# Patient Record
Sex: Male | Born: 1960
Health system: Southern US, Community
[De-identification: ages and names within clinical notes are randomized; demographics above are authoritative.]

## PROBLEM LIST (undated history)

## (undated) DIAGNOSIS — Z87898 Personal history of other specified conditions: Secondary | ICD-10-CM

## (undated) DIAGNOSIS — M1990 Unspecified osteoarthritis, unspecified site: Secondary | ICD-10-CM

## (undated) DIAGNOSIS — E669 Obesity, unspecified: Secondary | ICD-10-CM

## (undated) DIAGNOSIS — Z87442 Personal history of urinary calculi: Secondary | ICD-10-CM

## (undated) DIAGNOSIS — I82409 Acute embolism and thrombosis of unspecified deep veins of unspecified lower extremity: Secondary | ICD-10-CM

## (undated) DIAGNOSIS — M069 Rheumatoid arthritis, unspecified: Secondary | ICD-10-CM

## (undated) DIAGNOSIS — E119 Type 2 diabetes mellitus without complications: Secondary | ICD-10-CM

## (undated) DIAGNOSIS — C61 Malignant neoplasm of prostate: Secondary | ICD-10-CM

## (undated) DIAGNOSIS — Z9889 Other specified postprocedural states: Secondary | ICD-10-CM

## (undated) DIAGNOSIS — R001 Bradycardia, unspecified: Secondary | ICD-10-CM

## (undated) DIAGNOSIS — R972 Elevated prostate specific antigen [PSA]: Secondary | ICD-10-CM

## (undated) DIAGNOSIS — H269 Unspecified cataract: Secondary | ICD-10-CM

## (undated) DIAGNOSIS — N200 Calculus of kidney: Secondary | ICD-10-CM

## (undated) DIAGNOSIS — I10 Essential (primary) hypertension: Secondary | ICD-10-CM

## (undated) DIAGNOSIS — Z86711 Personal history of pulmonary embolism: Secondary | ICD-10-CM

## (undated) DIAGNOSIS — G4730 Sleep apnea, unspecified: Secondary | ICD-10-CM

## (undated) DIAGNOSIS — H8110 Benign paroxysmal vertigo, unspecified ear: Secondary | ICD-10-CM

## (undated) DIAGNOSIS — E0500 Thyrotoxicosis with diffuse goiter without thyrotoxic crisis or storm: Secondary | ICD-10-CM

## (undated) DIAGNOSIS — T8450XA Infection and inflammatory reaction due to unspecified internal joint prosthesis, initial encounter: Secondary | ICD-10-CM

## (undated) DIAGNOSIS — E079 Disorder of thyroid, unspecified: Secondary | ICD-10-CM

## (undated) DIAGNOSIS — I499 Cardiac arrhythmia, unspecified: Secondary | ICD-10-CM

## (undated) DIAGNOSIS — I209 Angina pectoris, unspecified: Secondary | ICD-10-CM

## (undated) DIAGNOSIS — G20A1 Parkinson's disease without dyskinesia, without mention of fluctuations: Secondary | ICD-10-CM

## (undated) HISTORY — DX: Elevated prostate specific antigen (PSA): R97.20

## (undated) HISTORY — DX: Personal history of pulmonary embolism: Z86.711

## (undated) HISTORY — DX: Malignant neoplasm of prostate: C61

## (undated) HISTORY — PX: OTHER SURGICAL HISTORY: SHX169

## (undated) HISTORY — DX: Acute embolism and thrombosis of unspecified deep veins of unspecified lower extremity: I82.409

## (undated) HISTORY — PX: NEPHROLITHOTOMY: SUR881

## (undated) HISTORY — DX: Obesity, unspecified: E66.9

## (undated) HISTORY — PX: BACK SURGERY: SHX140

## (undated) HISTORY — DX: Personal history of other specified conditions: Z87.898

## (undated) HISTORY — PX: KNEE SURGERY: SHX244

## (undated) HISTORY — DX: Calculus of kidney: N20.0

## (undated) HISTORY — DX: Rheumatoid arthritis, unspecified: M06.9

## (undated) HISTORY — DX: Benign paroxysmal vertigo, unspecified ear: H81.10

## (undated) HISTORY — DX: Bradycardia, unspecified: R00.1

## (undated) HISTORY — DX: Infection and inflammatory reaction due to unspecified internal joint prosthesis, initial encounter: T84.50XA

## (undated) HISTORY — DX: Unspecified cataract: H26.9

## (undated) HISTORY — PX: COLONOSCOPY: SHX174

## (undated) HISTORY — PX: LITHOTRIPSY: SUR834

## (undated) HISTORY — PX: MENISECTOMY: SHX5181

---
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
## 1994-08-18 DIAGNOSIS — E0500 Thyrotoxicosis with diffuse goiter without thyrotoxic crisis or storm: Secondary | ICD-10-CM

## 1994-08-18 HISTORY — DX: Thyrotoxicosis with diffuse goiter without thyrotoxic crisis or storm: E05.00

## 1997-08-18 HISTORY — PX: SPINE SURGERY: SHX786

## 1998-11-25 ENCOUNTER — Encounter: Payer: Self-pay | Admitting: Emergency Medicine

## 1998-11-25 ENCOUNTER — Emergency Department (HOSPITAL_COMMUNITY): Admission: EM | Admit: 1998-11-25 | Discharge: 1998-11-25 | Payer: Self-pay

## 2001-09-10 ENCOUNTER — Encounter: Admission: RE | Admit: 2001-09-10 | Discharge: 2001-09-10 | Payer: Self-pay | Admitting: Family Medicine

## 2001-09-10 ENCOUNTER — Encounter: Payer: Self-pay | Admitting: Family Medicine

## 2002-09-22 ENCOUNTER — Encounter (HOSPITAL_COMMUNITY): Admission: RE | Admit: 2002-09-22 | Discharge: 2002-10-22 | Payer: Self-pay | Admitting: Preventative Medicine

## 2002-10-04 ENCOUNTER — Encounter: Payer: Self-pay | Admitting: Preventative Medicine

## 2002-10-04 ENCOUNTER — Ambulatory Visit (HOSPITAL_COMMUNITY): Admission: RE | Admit: 2002-10-04 | Discharge: 2002-10-04 | Payer: Self-pay | Admitting: Preventative Medicine

## 2002-11-01 ENCOUNTER — Encounter (HOSPITAL_COMMUNITY): Admission: RE | Admit: 2002-11-01 | Discharge: 2002-12-01 | Payer: Self-pay | Admitting: Orthopaedic Surgery

## 2004-08-18 HISTORY — PX: KNEE ARTHROSCOPY: SUR90

## 2005-08-21 ENCOUNTER — Emergency Department (HOSPITAL_COMMUNITY): Admission: EM | Admit: 2005-08-21 | Discharge: 2005-08-21 | Payer: Self-pay | Admitting: Emergency Medicine

## 2005-08-28 ENCOUNTER — Emergency Department (HOSPITAL_COMMUNITY): Admission: EM | Admit: 2005-08-28 | Discharge: 2005-08-28 | Payer: Self-pay | Admitting: Emergency Medicine

## 2007-06-21 ENCOUNTER — Emergency Department (HOSPITAL_COMMUNITY): Admission: EM | Admit: 2007-06-21 | Discharge: 2007-06-21 | Payer: Self-pay | Admitting: Emergency Medicine

## 2007-06-24 ENCOUNTER — Ambulatory Visit: Payer: Self-pay | Admitting: Internal Medicine

## 2007-06-30 ENCOUNTER — Ambulatory Visit: Payer: Self-pay | Admitting: Internal Medicine

## 2007-06-30 ENCOUNTER — Ambulatory Visit (HOSPITAL_COMMUNITY): Admission: RE | Admit: 2007-06-30 | Discharge: 2007-06-30 | Payer: Self-pay | Admitting: Internal Medicine

## 2007-06-30 ENCOUNTER — Encounter: Payer: Self-pay | Admitting: Internal Medicine

## 2007-12-17 ENCOUNTER — Emergency Department (HOSPITAL_COMMUNITY): Admission: EM | Admit: 2007-12-17 | Discharge: 2007-12-17 | Payer: Self-pay | Admitting: Emergency Medicine

## 2008-05-08 ENCOUNTER — Emergency Department (HOSPITAL_COMMUNITY): Admission: EM | Admit: 2008-05-08 | Discharge: 2008-05-08 | Payer: Self-pay | Admitting: Emergency Medicine

## 2008-08-18 HISTORY — PX: KIDNEY STONE SURGERY: SHX686

## 2008-09-06 ENCOUNTER — Emergency Department (HOSPITAL_COMMUNITY): Admission: EM | Admit: 2008-09-06 | Discharge: 2008-09-06 | Payer: Self-pay | Admitting: Emergency Medicine

## 2009-10-08 ENCOUNTER — Ambulatory Visit: Payer: Self-pay | Admitting: Cardiology

## 2009-10-08 ENCOUNTER — Inpatient Hospital Stay (HOSPITAL_COMMUNITY): Admission: EM | Admit: 2009-10-08 | Discharge: 2009-10-11 | Payer: Self-pay | Admitting: Emergency Medicine

## 2009-10-09 ENCOUNTER — Encounter (INDEPENDENT_AMBULATORY_CARE_PROVIDER_SITE_OTHER): Payer: Self-pay | Admitting: Internal Medicine

## 2009-10-10 ENCOUNTER — Encounter: Payer: Self-pay | Admitting: Cardiology

## 2009-12-14 ENCOUNTER — Telehealth (INDEPENDENT_AMBULATORY_CARE_PROVIDER_SITE_OTHER): Payer: Self-pay

## 2010-01-30 ENCOUNTER — Emergency Department (HOSPITAL_COMMUNITY): Admission: EM | Admit: 2010-01-30 | Discharge: 2010-01-30 | Payer: Self-pay | Admitting: Emergency Medicine

## 2010-02-17 ENCOUNTER — Emergency Department (HOSPITAL_COMMUNITY): Admission: EM | Admit: 2010-02-17 | Discharge: 2010-02-18 | Payer: Self-pay | Admitting: Emergency Medicine

## 2010-09-17 NOTE — Progress Notes (Signed)
**Note De-Identified [place] Obfuscation** Summary: Event Monitor  Phone Note Other Incoming   Caller: Dorothy Reason for Call: Discuss billing issue Summary of Call: S: Dorothy from [facility] states that pt. has no insurance and that they sent him hardship papers but never received them or a phone call back.  B: Dr. Dietrich Pates saw pt. while in [facility] and ordered 21 day event monitor on 10-19-09. A: Several attempts have been made to reach pt. at [phone] but got no answer and pt. did not return call to office.  R:    Initial call taken by: Larita Fife Asante Ritacco LPN,  December 14, 2009 3:34 PM  Follow-up for Phone Call        Noted. Follow-up by: Kathlen Brunswick, MD, [facility],  Dec 17, 2009 6:45 AM

## 2010-11-03 LAB — STREP A DNA PROBE: Group A Strep Probe: NEGATIVE

## 2010-11-03 LAB — URINALYSIS, ROUTINE W REFLEX MICROSCOPIC
Glucose, UA: NEGATIVE mg/dL
Urobilinogen, UA: 1 mg/dL (ref 0.0–1.0)

## 2010-11-03 LAB — URINE CULTURE

## 2010-11-03 LAB — URINE MICROSCOPIC-ADD ON

## 2010-11-06 LAB — CARDIAC PANEL(CRET KIN+CKTOT+MB+TROPI)
CK, MB: 2.1 ng/mL (ref 0.3–4.0)
CK, MB: 3 ng/mL (ref 0.3–4.0)
Relative Index: 1.1 (ref 0.0–2.5)
Total CK: 186 U/L (ref 7–232)

## 2010-11-06 LAB — CULTURE, BLOOD (ROUTINE X 2)
Culture: NO GROWTH
Culture: NO GROWTH
Report Status: 2272011
Report Status: 2272011

## 2010-11-06 LAB — BASIC METABOLIC PANEL
BUN: 8 mg/dL (ref 6–23)
CO2: 30 mEq/L (ref 19–32)
Calcium: 9.5 mg/dL (ref 8.4–10.5)
Chloride: 104 mEq/L (ref 96–112)
Chloride: 107 mEq/L (ref 96–112)
Creatinine, Ser: 0.83 mg/dL (ref 0.4–1.5)
Creatinine, Ser: 0.88 mg/dL (ref 0.4–1.5)
GFR calc Af Amer: >60 mL/min (ref >60)
GFR calc non Af Amer: >60 mL/min (ref >60)
Glucose, Bld: 100 mg/dL — ABNORMAL HIGH (ref 70–99)
Glucose, Bld: 103 mg/dL — ABNORMAL HIGH (ref 70–99)
Glucose, Bld: 91 mg/dL (ref 70–99)
Potassium: 3.7 mEq/L (ref 3.5–5.1)
Potassium: 3.8 mEq/L (ref 3.5–5.1)
Potassium: 3.9 mEq/L (ref 3.5–5.1)
Sodium: 138 mEq/L (ref 135–145)
Sodium: 138 mEq/L (ref 135–145)

## 2010-11-06 LAB — CBC
HCT: 41.3 % (ref 39.0–52.0)
Hemoglobin: 14.4 g/dL (ref 13.0–17.0)
MCHC: 33.3 g/dL (ref 30.0–36.0)
MCHC: 33.5 g/dL (ref 30.0–36.0)
MCHC: 33.7 g/dL (ref 30.0–36.0)
MCV: 87.5 fL (ref 78.0–100.0)
Platelets: 263 10*3/uL (ref 150–400)
RBC: 4.7 MIL/uL (ref 4.22–5.81)
RBC: 4.93 MIL/uL (ref 4.22–5.81)
WBC: 6.7 10*3/uL (ref 4.0–10.5)
WBC: 7 10*3/uL (ref 4.0–10.5)

## 2010-11-06 LAB — DIFFERENTIAL
Basophils Absolute: 0 10*3/uL (ref 0.0–0.1)
Basophils Relative: 0 % (ref 0–1)
Basophils Relative: 0 % (ref 0–1)
Eosinophils Absolute: 0 10*3/uL (ref 0.0–0.7)
Eosinophils Relative: 1 % (ref 0–5)
Eosinophils Relative: 1 % (ref 0–5)
Lymphocytes Relative: 30 % (ref 12–46)
Lymphocytes Relative: 34 % (ref 12–46)
Lymphs Abs: 1.9 10*3/uL (ref 0.7–4.0)
Lymphs Abs: 2 10*3/uL (ref 0.7–4.0)
Monocytes Absolute: 0.4 10*3/uL (ref 0.1–1.0)
Monocytes Absolute: 0.5 10*3/uL (ref 0.1–1.0)
Monocytes Relative: 6 % (ref 3–12)
Monocytes Relative: 7 % (ref 3–12)
Neutro Abs: 3.7 10*3/uL (ref 1.7–7.7)
Neutro Abs: 4.1 10*3/uL (ref 1.7–7.7)
Neutrophils Relative %: 62 % (ref 43–77)

## 2010-11-06 LAB — URINALYSIS, ROUTINE W REFLEX MICROSCOPIC
Bilirubin Urine: NEGATIVE
Glucose, UA: NEGATIVE mg/dL
Hgb urine dipstick: NEGATIVE
Ketones, ur: NEGATIVE mg/dL
Nitrite: POSITIVE — AB
Protein, ur: NEGATIVE mg/dL
Specific Gravity, Urine: 1.025 (ref 1.005–1.030)
pH: 6 (ref 5.0–8.0)

## 2010-11-06 LAB — PROTIME-INR
INR: 0.97 (ref 0.00–1.49)
Prothrombin Time: 12.8 seconds (ref 11.6–15.2)

## 2010-11-06 LAB — RAPID URINE DRUG SCREEN, HOSP PERFORMED
Benzodiazepines: NOT DETECTED
Cocaine: NOT DETECTED
Tetrahydrocannabinol: NOT DETECTED

## 2010-11-06 LAB — HEPATIC FUNCTION PANEL
Alkaline Phosphatase: 76 U/L (ref 39–117)
Bilirubin, Direct: 0.1 mg/dL (ref 0.0–0.3)
Indirect Bilirubin: 0.8 mg/dL (ref 0.3–0.9)
Total Bilirubin: 0.9 mg/dL (ref 0.3–1.2)
Total Protein: 6.7 g/dL (ref 6.0–8.3)

## 2010-11-06 LAB — URINE CULTURE: Colony Count: >=100000

## 2010-11-06 LAB — BRAIN NATRIURETIC PEPTIDE: Pro B Natriuretic peptide (BNP): <30 pg/mL (ref 0.0–100.0)

## 2010-11-06 LAB — LIPID PANEL
Cholesterol: 161 mg/dL (ref 0–200)
Cholesterol: 173 mg/dL (ref 0–200)
LDL Cholesterol: 105 mg/dL — ABNORMAL HIGH (ref 0–99)
LDL Cholesterol: 96 mg/dL (ref 0–99)
Triglycerides: 146 mg/dL (ref <150)
Triglycerides: 155 mg/dL — ABNORMAL HIGH (ref <150)
VLDL: 29 mg/dL (ref 0–40)
VLDL: 31 mg/dL (ref 0–40)

## 2010-11-06 LAB — APTT
aPTT: 32 seconds (ref 24–37)
aPTT: 37 seconds (ref 24–37)

## 2010-11-06 LAB — POCT CARDIAC MARKERS: Myoglobin, poc: 76.6 ng/mL (ref 12–200)

## 2010-11-06 LAB — TSH: TSH: 1.637 u[IU]/mL (ref 0.350–4.500)

## 2010-11-06 LAB — URINE MICROSCOPIC-ADD ON

## 2010-12-31 NOTE — Consult Note (Signed)
NAME:  Willie Howe, Willie Howe                  ACCOUNT NO.:  [id number]   MEDICAL RECORD NO.:  [id number]          PATIENT TYPE:  AMB   LOCATION:  DAY                           FACILITY:  [facility]   PHYSICIAN:  Willie Howe, M.D. DATE OF BIRTH:  Dec 24, 1960   DATE OF CONSULTATION:  06/24/2007  DATE OF DISCHARGE:                                 CONSULTATION   PHYSICIAN REQUESTING CONSULTATION:  Dr. Greta Howe.   REASON FOR CONSULTATION:  Abnormal CT scan of stomach.   PRIMARY CARE PHYSICIAN:  Dr. Corine Howe in [place].   HISTORY OF PRESENT ILLNESS:  Patient is a 50 year old African-American  male who presented to the emergency department for a several week  history of left flank pain.  He states he went to the emergency  department because he had been laid off and does not have any insurance.  He states he has had intermittent left flank pain which tends to be  worse with movement.  He stated he has a lot of back problems but it  seems to be a little bit different type pain.  He also had had  difficulty urinating and thought this was all related.  He complains of  urinary frequency and nocturia.  Denies any dysuria.  At this point in  time his urinary symptoms seem to have improved.  He denies any nausea  or vomiting, dysphagia, odynophagia, heartburn, melena, rectal bleeding.  He does recall a couple weeks ago stools were a little loose and he  thought he might have ate something bad.  Stools have improved but are  still really soft.  In the emergency department he had two CTs, one  without contrast and one with contrast.  Basically, he had  nonobstructive left renal calculus.  He had omental nodularity,  cholelithiasis and thickening of the greater curvature of the stomach at  the level of the fundus.  Wall thickness was 2.8 centimeters.  Just  medial to the fundus/proximal body of the stomach there were a few lymph  nodes measuring up to 7 millimeters.  He also had a left external iliac  lymph node measuring 1.8 x 0.8 millimeters.  He had fatty infiltration  of the liver.  His white blood cell count was 6, hemoglobin 14.3,  hematocrit 42.4, platelets 293,000.  Sodium 136, potassium 4.1, BUN 7,  creatinine 0.76.  Urinalysis was negative.   CURRENT MEDICATIONS:  1. OxyContin p.Willien. for back pain.  2. Tylenol p.Willien.   ALLERGIES:  No known drug allergies.   PAST MEDICAL HISTORY:  1. He has a remote history of Graves' disease.  2. He states he has been off of medication for years and without any      difficulties.  3. He has had back problems status post three surgeries.  4. He has had three surgeries on his knee.  5. He had colonoscopy about 8 years ago in [place].  Denies any      polyps.  6. He has a remote history of bleeding peptic ulcer disease as a      teenager which he  states was related to alcohol use.   FAMILY HISTORY:  Paternal uncle and paternal grandmother both with  colorectal cancer, his uncle was in his 85s, his grandmother was in her  58s.   SOCIAL HISTORY:  1. He is married.  2. He has 4 children.  3. He is currently unemployed, having been laid off.  He worked for      the [employer] for the [employer].  4. He quit smoking over 14 years ago.  5. Quit alcohol use over 15 years ago.   REVIEW OF SYSTEMS:  See HPI for GI.  CONSTITUTIONAL:  Denies any weight  loss.  CARDIOPULMONARY:  Denies any chest pain or shortness of breath.  See HPI for GU.   PHYSICAL EXAM:  Weight 259, height 6 feet 3 inches, temp 98, blood  pressure 110/82, pulse 60.  GENERAL:  A pleasant, obese African-American male in no acute distress.  Skin warm and dry, no jaundice.  HEENT:  Sclera nonicteric.  Oropharyngeal mucosa moist and pink, no  lesions, erythema or exudate.  No lymphadenopathy or thyromegaly.  CHEST:  Lungs are clear to auscultation.  CARDIAC EXAM:  Reveals regular rate and rhythm, normal S1 and S2, no  murmurs, rubs or  gallops.  ABDOMEN:  Positive bowel sounds, obese but symmetrical.  He has very  mild tenderness along the left lower abdomen to left flank area to deep  palpation, no organomegaly or masses, no rebound, tenderness or  guarding, no abdominal bruits, no hernias.  EXTREMITIES:  No edema.   IMPRESSION:  [name] is a 50 year old gentleman who has a few week  history of left flank pain which tends to be worse with movement.  He  also had associated urinary symptoms which have improved.  A CT is  remarkable for gastric thickening and some adjacent lymph nodes  measuring 7 millimeters.  He also had omentum nodularity.  He needs to  have upper endoscopy to rule out gastric carcinoma.  It is possible  these findings could be due to prior or current peptic ulcer disease.  Omentum nodularity is concerning, however.  If unremarkable, he will  need to have a colonoscopy for further workup.  He denies any first  degree relatives with colorectal cancer but has two second degree  relatives with history of colorectal cancer and one at a fairly early  age.   PLAN:  1. EGD in the very near future.  2. Further recommendations to follow.   I would to thank Dr. Greta Howe for allowing [place] to take part in the  care of this patient.      Willie Howe, P.AJonathon Howe, M.D.  Electronically Signed    LL/MEDQ  D:  06/25/2007  T:  06/25/2007  Job:  [id number]   cc:   Willie Howe, M.D.  Fax: [phone]   Willie Howe, M.D.  Fax: [phone]

## 2010-12-31 NOTE — Op Note (Signed)
NAME:  Willie Howe, Willie Howe                  ACCOUNT NO.:  [id number]   MEDICAL RECORD NO.:  [id number]          PATIENT TYPE:  AMB   LOCATION:  DAY                           FACILITY:  [facility]   PHYSICIAN:  R. Roetta Sessions, M.D. DATE OF BIRTH:  1961/07/30   DATE OF PROCEDURE:  06/30/2007  DATE OF DISCHARGE:                               OPERATIVE REPORT   PROCEDURE:  Esophagogastroduodenoscopy and biopsy.   INDICATIONS FOR PROCEDURE:  This is a 50 year old African-American male  who presented to the ED recently for left flank pain.  Was seen by Dr.  Colon Branch.  CT without contrast, followed by CT of the abdomen with  contrast demonstrated suggestion of thickening of the gastric mucosa and  some abnormal appearing omentum.  Also nonobstructing left ureteral  calculi.  I reviewed these films with Dr. Gordan Payment recently and they  do raise a question of possible omental and gastric wall thickening.  He  comes here for EGD to further evaluate the possible abnormality on the  CT scan.  He has no __________ , odynophagia, dysphagia, early satiety,  reflux symptoms, nausea, or vomiting, hematochezia, or melena.  He has  not lost any weight.  EGD is now being performed. This approach has been  discussed with the patient at length.  Please see documentation in the  medical record.   PROCEDURE:  Oxygen saturation, blood pressure, pulse, respirations were  monitored throughout the entire procedure.  Conscious sedation with  Versed 3 mg IV, Demerol 100 mg IV in divided doses.  Cetacaine spray for  topical pharyngeal anesthesia.   INSTRUMENT:  Pentax video chip system.   FINDINGS:  Examination oftubular esophagus revealed a mid esophagus body  diverticulum and there was a tiny distal esophageal erosion, otherwise  the esophageal mucosa appeared normal.  EG junction was easily  traversed.  Stomach:  The gastric cavity was empty.  It was insufflated with air and  thorough examination of the gastric mucosa  including retroflexion of the  proximal stomach and esophagogastric junction demonstrated a small  hiatal hernia and some diffuse, tiny submucosal petechial hemorrhages.  There were some localized polypoid appearing mucosa in the antrum, and  an area of 1 cm polypoid mucosa on the incisura.  The stomach  insufflated again very well.  There was no ulcer.  I did not see any  evidence of any __________ linitis plastica or infiltrating process.  Pylorus was patent and easy to traversed. Examination of the bulb and  the second portion revealed no abnormalities.   THERAPEUTIC/DIAGNOSTIC MANEUVERS PERFORMED:  Biopsies of the polypoid  antral and incisura mucosa for tagged histologic studies using jumbo  forceps.  The patient tolerated the procedure well and was reactive in  endoscopy.   IMPRESSION:  1. Localized area of polypoid mucosa, antrum and incisura, diffuse      tiny submucosal gastric petechial hemorrhage of uncertain      significance, status post biopsy, small hiatal hernia.  2. Mid esophageal diverticulum, tiny distal esophageal erosion.  3. Normal D1and D2.   I doubt that these findings have  anything to do with his left flank  pain.  Moreover, I doubt that today's findings have anything to do with  thickening of the gastric mucosa and the questionable omental  abnormalities seen on recent CT scan.  I would continue to be concerned  about the possibility of the migration of left ureteral calculus from  time to time producing some of his left flank pain.   RECOMMENDATIONS:  1. We will follow up on pathology.  2. Check H. pylori serologies.  Will make further recommendations in      the very near future.  At a minimum Willie Howe ought to come back and      have a colonoscopy at some point in the near future.  If he has H.      Pylori, we will embark on a treatment regimen.      Willie Howe, M.D.  Electronically Signed     RMR/MEDQ  D:  06/30/2007  T:  07/01/2007   Job:  [id number]   cc:   Nicoletta Dress. Colon Branch, M.D.  Fax: [phone]   Alexia Freestone, M.D.  Fax: [phone]   Gordan Payment, MD  [facility]

## 2011-05-27 LAB — CBC
Platelets: 293
WBC: 6

## 2011-05-27 LAB — DIFFERENTIAL
Eosinophils Absolute: 0.1
Lymphocytes Relative: 28
Lymphs Abs: 1.7
Neutro Abs: 3.8
Neutrophils Relative %: 63

## 2011-05-27 LAB — BASIC METABOLIC PANEL
BUN: 7
Calcium: 9.2
Creatinine, Ser: 0.76
GFR calc Af Amer: >60
GFR calc non Af Amer: >60

## 2011-05-27 LAB — URINALYSIS, ROUTINE W REFLEX MICROSCOPIC
Ketones, ur: NEGATIVE
Nitrite: NEGATIVE
Protein, ur: NEGATIVE

## 2011-05-27 LAB — H. PYLORI ANTIBODY, IGG: H Pylori IgG: 4.5 — ABNORMAL HIGH

## 2011-06-22 ENCOUNTER — Encounter: Payer: Self-pay | Admitting: Emergency Medicine

## 2011-06-22 ENCOUNTER — Emergency Department (HOSPITAL_COMMUNITY)
Admission: EM | Admit: 2011-06-22 | Discharge: 2011-06-22 | Disposition: A | Discharge status: Home / Self Care | Payer: Self-pay | Attending: Emergency Medicine | Admitting: Emergency Medicine

## 2011-06-22 DIAGNOSIS — J029 Acute pharyngitis, unspecified: Secondary | ICD-10-CM | POA: Insufficient documentation

## 2011-06-22 DIAGNOSIS — Z87442 Personal history of urinary calculi: Secondary | ICD-10-CM | POA: Insufficient documentation

## 2011-06-22 DIAGNOSIS — Z87891 Personal history of nicotine dependence: Secondary | ICD-10-CM | POA: Insufficient documentation

## 2011-06-22 DIAGNOSIS — M129 Arthropathy, unspecified: Secondary | ICD-10-CM | POA: Insufficient documentation

## 2011-06-22 DIAGNOSIS — E0500 Thyrotoxicosis with diffuse goiter without thyrotoxic crisis or storm: Secondary | ICD-10-CM | POA: Insufficient documentation

## 2011-06-22 HISTORY — DX: Unspecified osteoarthritis, unspecified site: M19.90

## 2011-06-22 HISTORY — DX: Other specified postprocedural states: Z98.890

## 2011-06-22 HISTORY — DX: Disorder of thyroid, unspecified: E07.9

## 2011-06-22 LAB — RAPID STREP SCREEN (MED CTR MEBANE ONLY): Streptococcus, Group A Screen (Direct): NEGATIVE

## 2011-06-22 MED ORDER — LIDOCAINE VISCOUS 2 % MT SOLN
OROMUCOSAL | Status: AC
  Administered 2011-06-22: 15 mL
  Filled 2011-06-22: qty 15

## 2011-06-22 MED ORDER — IBUPROFEN 800 MG PO TABS
800.0000 mg | ORAL_TABLET | Freq: Once | ORAL | Status: AC
  Administered 2011-06-22: 800 mg via ORAL
  Filled 2011-06-22: qty 1

## 2011-06-22 MED ORDER — IBUPROFEN 800 MG PO TABS: 800.0000 mg | ORAL_TABLET | Freq: Three times a day (TID) | ORAL | Status: AC | PRN

## 2011-06-22 NOTE — ED Provider Notes (Signed)
Medical screening examination/treatment/procedure(s) were performed by non-physician practitioner and as supervising physician I was immediately available for consultation/collaboration.   Amad Mau L Helaman Mecca, MD 06/22/11 1502 

## 2011-06-22 NOTE — ED Notes (Signed)
MD at bedside. 

## 2011-06-22 NOTE — ED Notes (Signed)
Pt c/o sore throat and body aches. Pt denies n/v

## 2011-06-22 NOTE — ED Provider Notes (Signed)
History     CSN: [id number] Arrival date & time: 06/22/2011  7:34 AM   First MD Initiated Contact with Patient 06/22/11 [id number]      Chief Complaint  Patient presents with  . Sore Throat  . Fever    (Consider location/radiation/quality/duration/timing/severity/associated sxs/prior treatment) Patient is a 50 y.o. male presenting with pharyngitis and fever. The history is provided by the patient.  Sore Throat This is a new problem. Episode onset: 2 days ago. The problem occurs constantly. The problem has been unchanged. Associated symptoms include chills, fatigue, a fever, myalgias and a sore throat. Pertinent negatives include no abdominal pain, arthralgias, chest pain, congestion, coughing, headaches, joint swelling, nausea, neck pain, numbness, rash, swollen glands, vomiting or weakness. The symptoms are aggravated by swallowing. He has tried acetaminophen for the symptoms. The treatment provided no relief.  Fever Primary symptoms of the febrile illness include fever, fatigue and myalgias. Primary symptoms do not include headaches, cough, shortness of breath, abdominal pain, nausea, vomiting, arthralgias or rash.  The myalgias are not associated with weakness.    Past Medical History  Diagnosis Date  . Kidney stone   . Arthritis   . Thyroid disease     Graves Disease  . Previous back surgery     x 3    Past Surgical History  Procedure Date  . Knee surgery     x 2  . Nephrolithotomy     No family history on file.  History  Substance Use Topics  . Smoking status: Former Games developer  . Smokeless tobacco: Not on file  . Alcohol Use: No      Review of Systems  Constitutional: Positive for fever, chills and fatigue.  HENT: Positive for sore throat. Negative for congestion, trouble swallowing, neck pain, voice change and sinus pressure.   Eyes: Negative.   Respiratory: Negative for cough, chest tightness and shortness of breath.   Cardiovascular: Negative for chest pain.    Gastrointestinal: Negative for nausea, vomiting and abdominal pain.  Genitourinary: Negative.   Musculoskeletal: Positive for myalgias. Negative for joint swelling and arthralgias.  Skin: Negative.  Negative for rash and wound.  Neurological: Negative for dizziness, weakness, light-headedness, numbness and headaches.  Hematological: Negative.   Psychiatric/Behavioral: Negative.     Allergies  Review of patient's allergies indicates no known allergies.  Home Medications   Current Outpatient Rx  Name Route Sig Dispense Refill  . ACETAMINOPHEN ER 650 MG PO TBCR Oral Take 1,300 mg by mouth every 8 (eight) hours as needed. For pain     . IBUPROFEN 800 MG PO TABS Oral Take 1 tablet (800 mg total) by mouth every 8 (eight) hours as needed for pain. 15 tablet 0    BP 135/84  Pulse 91  Temp(Src) 99 F (37.2 C) (Oral)  Resp 18  Ht 6\' 3"  (1.905 m)  Wt 250 lb (113.399 kg)  BMI 31.25 kg/m2  SpO2 97%  Physical Exam  Nursing note and vitals reviewed. Constitutional: He is oriented to person, place, and time. He appears well-developed and well-nourished.  HENT:  Head: Normocephalic and atraumatic.  Right Ear: External ear normal.  Left Ear: External ear normal.  Nose: Nose normal.  Mouth/Throat: Mucous membranes are normal. Posterior oropharyngeal erythema present. No oropharyngeal exudate, posterior oropharyngeal edema or tonsillar abscesses.  Eyes: Conjunctivae are normal.  Neck: Normal range of motion.  Cardiovascular: Normal rate, regular rhythm, normal heart sounds and intact distal pulses.   Pulmonary/Chest: Effort normal and breath sounds normal.  He has no wheezes.  Abdominal: Soft. Bowel sounds are normal. There is no tenderness.  Musculoskeletal: Normal range of motion.  Neurological: He is alert and oriented to person, place, and time.  Skin: Skin is warm and dry.  Psychiatric: He has a normal mood and affect.    ED Course  Procedures (including critical care  time)   Labs Reviewed  RAPID STREP SCREEN   No results found.   1. Viral pharyngitis       MDM  Strep negative,  Strep culture sent.        Candis Musa, PA 06/22/11 1039

## 2011-06-22 NOTE — ED Notes (Signed)
Patient c/o sore throat and fever since Friday with symptoms worsening gradually.

## 2011-06-23 LAB — STREP A DNA PROBE: Group A Strep Probe: NEGATIVE

## 2012-07-19 ENCOUNTER — Emergency Department (HOSPITAL_COMMUNITY): Payer: Self-pay

## 2012-07-19 ENCOUNTER — Emergency Department (HOSPITAL_COMMUNITY)
Admission: EM | Admit: 2012-07-19 | Discharge: 2012-07-19 | Disposition: A | Discharge status: Home / Self Care | Payer: Self-pay | Attending: Emergency Medicine | Admitting: Emergency Medicine

## 2012-07-19 ENCOUNTER — Encounter (HOSPITAL_COMMUNITY): Payer: Self-pay | Admitting: *Deleted

## 2012-07-19 DIAGNOSIS — R059 Cough, unspecified: Secondary | ICD-10-CM | POA: Insufficient documentation

## 2012-07-19 DIAGNOSIS — Z87442 Personal history of urinary calculi: Secondary | ICD-10-CM | POA: Insufficient documentation

## 2012-07-19 DIAGNOSIS — J3489 Other specified disorders of nose and nasal sinuses: Secondary | ICD-10-CM | POA: Insufficient documentation

## 2012-07-19 DIAGNOSIS — R05 Cough: Secondary | ICD-10-CM

## 2012-07-19 DIAGNOSIS — Z87891 Personal history of nicotine dependence: Secondary | ICD-10-CM | POA: Insufficient documentation

## 2012-07-19 DIAGNOSIS — E0500 Thyrotoxicosis with diffuse goiter without thyrotoxic crisis or storm: Secondary | ICD-10-CM | POA: Insufficient documentation

## 2012-07-19 DIAGNOSIS — Z9889 Other specified postprocedural states: Secondary | ICD-10-CM | POA: Insufficient documentation

## 2012-07-19 DIAGNOSIS — R6883 Chills (without fever): Secondary | ICD-10-CM | POA: Insufficient documentation

## 2012-07-19 DIAGNOSIS — J029 Acute pharyngitis, unspecified: Secondary | ICD-10-CM | POA: Insufficient documentation

## 2012-07-19 DIAGNOSIS — Z8739 Personal history of other diseases of the musculoskeletal system and connective tissue: Secondary | ICD-10-CM | POA: Insufficient documentation

## 2012-07-19 DIAGNOSIS — IMO0001 Reserved for inherently not codable concepts without codable children: Secondary | ICD-10-CM | POA: Insufficient documentation

## 2012-07-19 LAB — RAPID STREP SCREEN (MED CTR MEBANE ONLY): Streptococcus, Group A Screen (Direct): NEGATIVE

## 2012-07-19 MED ORDER — GUAIFENESIN-CODEINE 100-10 MG/5ML PO SYRP: 10.0000 mL | ORAL_SOLUTION | Freq: Three times a day (TID) | ORAL | Status: AC | PRN

## 2012-07-19 MED ORDER — AZITHROMYCIN 250 MG PO TABS: ORAL_TABLET | ORAL | Status: DC

## 2012-07-19 MED ORDER — IBUPROFEN 800 MG PO TABS: 800.0000 mg | ORAL_TABLET | Freq: Three times a day (TID) | ORAL | Status: DC

## 2012-07-19 MED ORDER — ALBUTEROL SULFATE HFA 108 (90 BASE) MCG/ACT IN AERS
2.0000 | INHALATION_SPRAY | Freq: Once | RESPIRATORY_TRACT | Status: AC
  Administered 2012-07-19: 2 via RESPIRATORY_TRACT
  Filled 2012-07-19: qty 6.7

## 2012-07-19 NOTE — ED Notes (Signed)
Resp Therapy paged for inhaler.

## 2012-07-19 NOTE — ED Provider Notes (Signed)
History     CSN: [id number]  Arrival date & time 07/19/12  1302   First MD Initiated Contact with Patient 07/19/12 1404      Chief Complaint  Patient presents with  . Cough    (Consider location/radiation/quality/duration/timing/severity/associated sxs/prior treatment) Patient is a 51 y.o. male presenting with cough. The history is provided by the patient.  Cough This is a new problem. The current episode started more than 2 days ago. The problem occurs constantly. The problem has not changed since onset.The cough is productive of sputum. There has been no fever. Associated symptoms include chills, sweats, rhinorrhea, sore throat and myalgias. Pertinent negatives include no chest pain, no ear pain, no headaches, no shortness of breath and no wheezing. Associated symptoms comments: Nasal congestion. He has tried nothing for the symptoms. The treatment provided no relief. He is not a smoker. His past medical history does not include pneumonia or asthma.    Past Medical History  Diagnosis Date  . Arthritis   . Thyroid disease     Graves Disease  . Previous back surgery     x 3  . Kidney stone     Past Surgical History  Procedure Date  . Knee surgery     x 2  . Nephrolithotomy   . Back surgery     History reviewed. No pertinent family history.  History  Substance Use Topics  . Smoking status: Former Games developer  . Smokeless tobacco: Not on file  . Alcohol Use: No      Review of Systems  Constitutional: Positive for chills and fatigue. Negative for fever, activity change and appetite change.  HENT: Positive for congestion, sore throat and rhinorrhea. Negative for ear pain, facial swelling, trouble swallowing, neck pain and neck stiffness.   Eyes: Negative for visual disturbance.  Respiratory: Positive for cough. Negative for chest tightness, shortness of breath, wheezing and stridor.   Cardiovascular: Negative for chest pain.  Gastrointestinal: Negative for nausea and  vomiting.  Musculoskeletal: Positive for myalgias. Negative for back pain.  Skin: Negative.  Negative for rash.  Neurological: Negative for dizziness, weakness, numbness and headaches.  Hematological: Negative for adenopathy.  Psychiatric/Behavioral: Negative for confusion.  All other systems reviewed and are negative.    Allergies  Review of patient's allergies indicates no known allergies.  Home Medications   Current Outpatient Rx  Name  Route  Sig  Dispense  Refill  . BENGAY EX   Apply externally   Apply 1 application topically 2 (two) times daily as needed. Pain.           BP 124/75  Pulse 95  Temp 99 F (37.2 C) (Oral)  Resp 18  Ht 6\' 3"  (1.905 m)  Wt 243 lb (110.224 kg)  BMI 30.37 kg/m2  SpO2 100%  Physical Exam  Nursing note and vitals reviewed. Constitutional: He is oriented to person, place, and time. He appears well-developed and well-nourished. No distress.  HENT:  Head: Normocephalic and atraumatic.  Right Ear: Tympanic membrane and ear canal normal.  Left Ear: Tympanic membrane and ear canal normal.  Mouth/Throat: Uvula is midline, oropharynx is clear and moist and mucous membranes are normal. No oropharyngeal exudate.  Eyes: EOM are normal. Pupils are equal, round, and reactive to light.  Neck: Normal range of motion. Neck supple.  Cardiovascular: Normal rate, regular rhythm, normal heart sounds and intact distal pulses.   No murmur heard. Pulmonary/Chest: Effort normal. No respiratory distress. He has no rales. He exhibits no  tenderness.       Coarse lungs sounds bilaterally.  Few scattered expir wheezes.  No rales  Musculoskeletal: He exhibits no edema and no tenderness.  Lymphadenopathy:    He has no cervical adenopathy.  Neurological: He is alert and oriented to person, place, and time. He exhibits normal muscle tone. Coordination normal.  Skin: Skin is warm and dry.    ED Course  Procedures (including critical care time)  Results for  orders placed during the hospital encounter of 07/19/12  RAPID STREP SCREEN      Component Value Range   Streptococcus, Group A Screen (Direct) NEGATIVE  NEGATIVE    Dg Chest 2 View  07/19/2012  *RADIOLOGY REPORT*  Clinical Data: Cough and congestion.  Body aches.  CHEST - 2 VIEW  Comparison: 10/08/2009  Findings: Normal heart size. Calcified right hilar lymph nodes identified consistent with prior granulomatous disease.  No pleural effusion or edema.  No airspace consolidation.  Review of the visualized osseous structures is unremarkable.  IMPRESSION:  1.  No acute cardiopulmonary abnormalities.   Original Report Authenticated By: Signa Kell, M.D.         MDM    Vitals stable.  Pt is non-toxic appearing. No tachycardia, tachypnea or hypoxia.  Likely bronchitis  Pt dispensed albuterol MDI. Agrees to f/u with PMD.  Prescribed: zithromax Robitussin AC ibuprofen         Anahid Eskelson L. [place], [place] 07/20/12 2112

## 2012-07-19 NOTE — ED Notes (Signed)
Awaiting RT for discharge. 

## 2012-07-19 NOTE — ED Notes (Signed)
Cough, sore throat, body aches.runny nose

## 2012-07-20 NOTE — ED Provider Notes (Signed)
Medical screening examination/treatment/procedure(s) were performed by non-physician practitioner and as supervising physician I was immediately available for consultation/collaboration.   Shelda Jakes, MD 07/20/12 2259

## 2013-05-24 ENCOUNTER — Emergency Department (HOSPITAL_COMMUNITY): Payer: BC Managed Care – PPO

## 2013-05-24 ENCOUNTER — Emergency Department (HOSPITAL_COMMUNITY)
Admission: EM | Admit: 2013-05-24 | Discharge: 2013-05-24 | Disposition: A | Discharge status: Home / Self Care | Payer: BC Managed Care – PPO | Attending: Emergency Medicine | Admitting: Emergency Medicine

## 2013-05-24 ENCOUNTER — Encounter (HOSPITAL_COMMUNITY): Payer: Self-pay | Admitting: *Deleted

## 2013-05-24 DIAGNOSIS — M069 Rheumatoid arthritis, unspecified: Secondary | ICD-10-CM | POA: Insufficient documentation

## 2013-05-24 DIAGNOSIS — Z87442 Personal history of urinary calculi: Secondary | ICD-10-CM | POA: Insufficient documentation

## 2013-05-24 DIAGNOSIS — Z862 Personal history of diseases of the blood and blood-forming organs and certain disorders involving the immune mechanism: Secondary | ICD-10-CM | POA: Insufficient documentation

## 2013-05-24 DIAGNOSIS — M545 Low back pain, unspecified: Secondary | ICD-10-CM

## 2013-05-24 DIAGNOSIS — M79609 Pain in unspecified limb: Secondary | ICD-10-CM | POA: Insufficient documentation

## 2013-05-24 DIAGNOSIS — Z9889 Other specified postprocedural states: Secondary | ICD-10-CM | POA: Insufficient documentation

## 2013-05-24 DIAGNOSIS — Z8639 Personal history of other endocrine, nutritional and metabolic disease: Secondary | ICD-10-CM | POA: Insufficient documentation

## 2013-05-24 DIAGNOSIS — R61 Generalized hyperhidrosis: Secondary | ICD-10-CM | POA: Insufficient documentation

## 2013-05-24 DIAGNOSIS — Z87891 Personal history of nicotine dependence: Secondary | ICD-10-CM | POA: Insufficient documentation

## 2013-05-24 MED ORDER — OXYCODONE-ACETAMINOPHEN 5-325 MG PO TABS: 1.0000 | ORAL_TABLET | ORAL | Status: DC | PRN

## 2013-05-24 MED ORDER — CYCLOBENZAPRINE HCL 10 MG PO TABS
10.0000 mg | ORAL_TABLET | Freq: Once | ORAL | Status: AC
  Administered 2013-05-24: 10 mg via ORAL
  Filled 2013-05-24: qty 1

## 2013-05-24 MED ORDER — HYDROMORPHONE HCL PF 2 MG/ML IJ SOLN
2.0000 mg | Freq: Once | INTRAMUSCULAR | Status: AC
  Administered 2013-05-24: 2 mg via INTRAMUSCULAR
  Filled 2013-05-24: qty 1

## 2013-05-24 MED ORDER — CYCLOBENZAPRINE HCL 10 MG PO TABS: 10.0000 mg | ORAL_TABLET | Freq: Two times a day (BID) | ORAL | Status: DC | PRN

## 2013-05-24 MED ORDER — KETOROLAC TROMETHAMINE 60 MG/2ML IM SOLN
60.0000 mg | Freq: Once | INTRAMUSCULAR | Status: AC
  Administered 2013-05-24: 60 mg via INTRAMUSCULAR
  Filled 2013-05-24: qty 2

## 2013-05-24 NOTE — ED Notes (Signed)
Pt reports h/o back surgery, now having low back pain.

## 2013-05-24 NOTE — ED Provider Notes (Signed)
CSN: [id number]     Arrival date & time 05/24/13  4098 History   This chart was scribed for Audree Camel, MD, by Yevette Edwards, ED Scribe. This patient was seen in room APA19/APA19 and the patient's care was started at 8:54 AM. First MD Initiated Contact with Patient 05/24/13 [phone]     Chief Complaint  Patient presents with  . Back Pain    The history is provided by the patient. No language interpreter was used.   HPI Comments: Willie Howe is a 52 y.o. male, with a h/o back surgery 15 years ago and RA, who presents to the Emergency Department complaining of gradually-worsening back pain which began approximately a week ago and which radiates down his left leg posteriorly to his ankle.  The pt characterizes the pain as "stabbing" and "sharp," and he rates the pain as 10/10. He also states that the pain has affected his ambulation. He reports the pain to his left leg is atypical; he usually experiences pain to his right leg when associated with back pain. The pt states experiencing pain when pushing to defecate. He denies any dysuria, abdominal pain, hematuria, fever, or chills.  He also denies any recent injuries to his back. He has attempted to mitigate his pain with IBU, but with minimal resolution. The pt is a former smoker. Denies any bowel or bladder incontinence. Denies any focal weakness or numbness.  The pt has had surgery to both legs; one for ligament repair and one for cartilage removal.   His PCP is associated with [facility].  Past Medical History  Diagnosis Date  . Arthritis   . Thyroid disease     Graves Disease  . Previous back surgery     x 3  . Kidney stone    Past Surgical History  Procedure Laterality Date  . Knee surgery      x 2  . Nephrolithotomy    . Back surgery     No family history on file. History  Substance Use Topics  . Smoking status: Former Games developer  . Smokeless tobacco: Not on file  . Alcohol Use: No    Review of Systems   Constitutional: Positive for diaphoresis (Baseline). Negative for fever and chills.  Gastrointestinal: Negative for abdominal pain.  Genitourinary: Negative for dysuria and hematuria.  Musculoskeletal: Positive for back pain and gait problem.  Neurological: Negative for weakness and numbness.  All other systems reviewed and are negative.    Allergies  Review of patient's allergies indicates no known allergies.  Home Medications   Current Outpatient Rx  Name  Route  Sig  Dispense  Refill  . Menthol, Topical Analgesic, (BENGAY EX)   Apply externally   Apply 1 application topically 2 (two) times daily as needed. Pain.          Triage Vitals: BP 138/88  Pulse 79  Temp(Src) 97.8 F (36.6 C) (Oral)  Resp 17  Ht 6\' 3"  (1.905 m)  Wt 250 lb (113.399 kg)  BMI 31.25 kg/m2  SpO2 96%  Physical Exam  Nursing note and vitals reviewed. Constitutional: He is oriented to person, place, and time. He appears well-developed and well-nourished. No distress.  HENT:  Head: Normocephalic and atraumatic.  Neck: Neck supple. No tracheal deviation present.  Cardiovascular: Normal rate.   Pulmonary/Chest: Effort normal. No respiratory distress.  Genitourinary: Rectum normal.  Normal perianal sensation. Normal rectal tone.   Musculoskeletal: Normal range of motion. He exhibits tenderness.  Lumbar back: He exhibits tenderness (left sided).  Well healed lower lumbar scar  Neurological: He is alert and oriented to person, place, and time. He has normal strength. No sensory deficit. Gait normal.  Reflex Scores:      Patellar reflexes are 2+ on the right side and 2+ on the left side.      Achilles reflexes are 2+ on the right side and 2+ on the left side. Skin: Skin is warm and dry.  Psychiatric: He has a normal mood and affect. His behavior is normal.    ED Course  Procedures (including critical care time)  DIAGNOSTIC STUDIES: Oxygen Saturation is 96% on room air, normal by my  interpretation.    COORDINATION OF CARE:  9:07 AM- Discussed treatment plan with patient, and the patient agreed to the plan.   9:08 AM- Performed chaperoned rectal exam.   Labs Review Labs Reviewed - No data to display Imaging Review Dg Lumbar Spine Complete  05/24/2013   CLINICAL DATA:  Low back pain radiating down the left leg. History of lumbar spine fusion surgery.  EXAM: LUMBAR SPINE - COMPLETE 4+ VIEW  COMPARISON:  06/21/2007, reconstructed images from a abdomen and pelvis CT. Lumbar spine radiographs, 08/21/2005.  FINDINGS: There has been a previous posterior fusion from L4 through S1 with bilateral pedicle screws and interconnecting rods. Mature bone graft material spans the posterior elements. The orthopedic hardware is well seated with no evidence of loosening.  There is no fracture or spondylolisthesis. Bone graft material extends across the narrowed disk interspace is at L4-L5 and L5-S1. The remaining disk spaces are well preserved. There are small anterior endplate osteophytes at L2, L3 and L4. The unfused facet joints are well maintained.  The surrounding soft tissues are unremarkable.  IMPRESSION: 1. Mature fusion from L4 through S1. No evidence of loosening of the orthopedic hardware bone graft material. 2. No fracture. No spondylolisthesis. 3. Mild degenerative spurring along the endplates of the unfused lumbar spine. 4. No change when compared to the prior CT or lumbar spine radiographs.   Electronically Signed   By: [name] M.D.   On: 05/24/2013 10:31    MDM   1. Low back pain    Patient is immunocompromised with history of taking methotrexate and prednisone but he is showing no other signs of acute spinal cord emergency. He has a normal neurologic exam including a normal gait. He not have any bowel or bladder symptoms. He has a normal rectal exam. His pain significantly improved with pain medication. His x-ray shows unchanged hardware and no new bony abnormalities. I feel  that and epidural abscess, discitis, or other cause of cauda equina is unlikely at bedtime. I discussed strict return precautions with him and his wife. He will followup with his PCP and will try to arrange neurosurgery followup.  I personally performed the services described in this documentation, which was scribed in my presence. The recorded information has been reviewed and is accurate.    Audree Camel, MD 05/24/13 [phone]

## 2013-05-24 NOTE — ED Notes (Addendum)
Patient w/history of prior back surgery (1999) w/placement of hardware, now having flare up of pain in lower thoracic - upper lumbar region radiating to L side and down L leg posterior-laterally to ankle.  Having difficulty pushing to deficate, no difficulty urinating.  10/10 pain affecting daily functioning. 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  Has RA and is on Methotrexate, Prednisone for pain, no narcotic rxs.

## 2013-06-07 ENCOUNTER — Other Ambulatory Visit: Payer: Self-pay | Admitting: Rheumatology

## 2013-06-07 DIAGNOSIS — M5416 Radiculopathy, lumbar region: Secondary | ICD-10-CM

## 2013-06-11 ENCOUNTER — Ambulatory Visit
Admission: RE | Admit: 2013-06-11 | Discharge: 2013-06-11 | Disposition: A | Discharge status: Home / Self Care | Payer: BC Managed Care – PPO | Source: Ambulatory Visit | Attending: Rheumatology | Admitting: Rheumatology

## 2013-06-11 DIAGNOSIS — M5416 Radiculopathy, lumbar region: Secondary | ICD-10-CM

## 2013-07-11 ENCOUNTER — Other Ambulatory Visit: Payer: Self-pay | Admitting: Neurosurgery

## 2013-07-11 DIAGNOSIS — M48061 Spinal stenosis, lumbar region without neurogenic claudication: Secondary | ICD-10-CM

## 2013-07-22 ENCOUNTER — Ambulatory Visit
Admission: RE | Admit: 2013-07-22 | Discharge: 2013-07-22 | Disposition: A | Discharge status: Home / Self Care | Payer: Disability Insurance | Source: Ambulatory Visit | Attending: Neurosurgery | Admitting: Neurosurgery

## 2013-07-22 VITALS — BP 114/78 | HR 86

## 2013-07-22 DIAGNOSIS — M48061 Spinal stenosis, lumbar region without neurogenic claudication: Secondary | ICD-10-CM

## 2013-07-22 MED ORDER — IOHEXOL 180 MG/ML  SOLN
15.0000 mL | Freq: Once | INTRAMUSCULAR | Status: AC | PRN
  Administered 2013-07-22: 15 mL via INTRATHECAL

## 2013-07-22 MED ORDER — DIAZEPAM 5 MG PO TABS
10.0000 mg | ORAL_TABLET | Freq: Once | ORAL | Status: AC
  Administered 2013-07-22: 10 mg via ORAL

## 2013-07-22 NOTE — Progress Notes (Signed)
Patient states he has been off Tramadol for at least two days.  jkl

## 2013-07-22 NOTE — Progress Notes (Signed)
Repositioned for comfort.  Does not want pain medication at present.  Wife at bedside.  jkl

## 2013-08-04 ENCOUNTER — Other Ambulatory Visit: Payer: Self-pay | Admitting: Neurosurgery

## 2013-08-04 DIAGNOSIS — M48061 Spinal stenosis, lumbar region without neurogenic claudication: Secondary | ICD-10-CM

## 2013-08-09 ENCOUNTER — Ambulatory Visit
Admission: RE | Admit: 2013-08-09 | Discharge: 2013-08-09 | Disposition: A | Discharge status: Home / Self Care | Payer: Disability Insurance | Source: Ambulatory Visit | Attending: Neurosurgery | Admitting: Neurosurgery

## 2013-08-09 DIAGNOSIS — M48061 Spinal stenosis, lumbar region without neurogenic claudication: Secondary | ICD-10-CM

## 2013-08-09 MED ORDER — METHYLPREDNISOLONE ACETATE 40 MG/ML INJ SUSP (RADIOLOG
120.0000 mg | Freq: Once | INTRAMUSCULAR | Status: AC
  Administered 2013-08-09: 120 mg via EPIDURAL

## 2013-08-09 MED ORDER — IOHEXOL 180 MG/ML  SOLN
1.0000 mL | Freq: Once | INTRAMUSCULAR | Status: AC | PRN
  Administered 2013-08-09: 1 mL via EPIDURAL

## 2013-08-18 HISTORY — PX: BACK SURGERY: SHX140

## 2013-08-18 HISTORY — PX: SPINE SURGERY: SHX786

## 2014-01-19 ENCOUNTER — Other Ambulatory Visit: Payer: Self-pay | Admitting: Neurosurgery

## 2014-01-31 ENCOUNTER — Encounter (HOSPITAL_COMMUNITY): Payer: Self-pay | Admitting: Pharmacy Technician

## 2014-02-01 NOTE — Pre-Procedure Instructions (Signed)
Willie Howe  02/01/2014   Your procedure is scheduled on: Friday, February 10, 2014  Report to [facility] Short Stay (use Main Entrance "A'') at 5:30 AM.  Call this number if you have problems the morning of surgery: [phone]   Remember:   Do not eat food or drink liquids after midnight Thursday, February 09, 2014   Take these medicines the morning of surgery with A SIP OF WATER:  predniSONE (DELTASONE) If needed: oxyCODONE for pain Stop taking Aspirin, vitamins and herbal medications. Do not take any NSAIDs ie: Ibuprofen, Advil, Naproxen or any medication containing Aspirin. Stop 5 days prior to procedure (Sat. 02/04/14).  Do not wear jewelry, make-up or nail polish.  Do not wear lotions, powders, or perfumes. You may wear deodorant.  Do not shave 48 hours prior to surgery. Men may shave face and neck.  Do not bring valuables to the hospital.  [facility] is not responsible for any belongings or valuables.               Contacts, dentures or bridgework may not be worn into surgery.  Leave suitcase in the car. After surgery it may be brought to your room.  For patients admitted to the hospital, discharge time is determined by your treatment team.               Patients discharged the day of surgery will not be allowed to drive home.  Name and phone number of your driver:   Special Instructions:  Special Instructions:Special Instructions: [facility] - Preparing for Surgery  Before surgery, you can play an important role.  Because skin is not sterile, your skin needs to be as free of germs as possible.  You can reduce the number of germs on you skin by washing with CHG (chlorahexidine gluconate) soap before surgery.  CHG is an antiseptic cleaner which kills germs and bonds with the skin to continue killing germs even after washing.  Please DO NOT use if you have an allergy to CHG or antibacterial soaps.  If your skin becomes reddened/irritated stop using the CHG and inform your nurse when you  arrive at Short Stay.  Do not shave (including legs and underarms) for at least 48 hours prior to the first CHG shower.  You may shave your face.  Please follow these instructions carefully:   1.  Shower with CHG Soap the night before surgery and the morning of Surgery.  2.  If you choose to wash your hair, wash your hair first as usual with your normal shampoo.  3.  After you shampoo, rinse your hair and body thoroughly to remove the Shampoo.  4.  Use CHG as you would any other liquid soap.  You can apply chg directly  to the skin and wash gently with scrungie or a clean washcloth.  5.  Apply the CHG Soap to your body ONLY FROM THE NECK DOWN.  Do not use on open wounds or open sores.  Avoid contact with your eyes, ears, mouth and genitals (private parts).  Wash genitals (private parts) with your normal soap.  6.  Wash thoroughly, paying special attention to the area where your surgery will be performed.  7.  Thoroughly rinse your body with warm water from the neck down.  8.  DO NOT shower/wash with your normal soap after using and rinsing off the CHG Soap.  9.  Pat yourself dry with a clean towel.  10.  Wear clean pajamas.            11.  Place clean sheets on your bed the night of your first shower and do not sleep with pets.  Day of Surgery  Do not apply any lotions the morning of surgery.  Please wear clean clothes to the hospital/surgery center.   Please read over the following fact sheets that you were given: Pain Booklet, Coughing and Deep Breathing, Blood Transfusion Information, MRSA Information and Surgical Site Infection Prevention

## 2014-02-02 ENCOUNTER — Encounter (HOSPITAL_COMMUNITY)
Admission: RE | Admit: 2014-02-02 | Discharge: 2014-02-02 | Disposition: A | Discharge status: Home / Self Care | Payer: BC Managed Care – PPO | Source: Ambulatory Visit | Attending: Anesthesiology | Admitting: Anesthesiology

## 2014-02-02 ENCOUNTER — Encounter (HOSPITAL_COMMUNITY)
Admission: RE | Admit: 2014-02-02 | Discharge: 2014-02-02 | Disposition: A | Discharge status: Home / Self Care | Payer: BC Managed Care – PPO | Source: Ambulatory Visit | Attending: Neurosurgery | Admitting: Neurosurgery

## 2014-02-02 ENCOUNTER — Encounter (HOSPITAL_COMMUNITY): Payer: Self-pay

## 2014-02-02 DIAGNOSIS — Z01818 Encounter for other preprocedural examination: Secondary | ICD-10-CM | POA: Insufficient documentation

## 2014-02-02 DIAGNOSIS — Z01812 Encounter for preprocedural laboratory examination: Secondary | ICD-10-CM | POA: Insufficient documentation

## 2014-02-02 DIAGNOSIS — R079 Chest pain, unspecified: Secondary | ICD-10-CM | POA: Insufficient documentation

## 2014-02-02 HISTORY — DX: Cardiac arrhythmia, unspecified: I49.9

## 2014-02-02 HISTORY — DX: Angina pectoris, unspecified: I20.9

## 2014-02-02 LAB — BASIC METABOLIC PANEL
BUN: 10 mg/dL (ref 6–23)
CHLORIDE: 103 meq/L (ref 96–112)
CO2: 25 mEq/L (ref 19–32)
Calcium: 9.6 mg/dL (ref 8.4–10.5)
Creatinine, Ser: 0.8 mg/dL (ref 0.50–1.35)
GFR calc Af Amer: >90 mL/min (ref >90)
GFR calc non Af Amer: >90 mL/min (ref >90)
Glucose, Bld: 109 mg/dL — ABNORMAL HIGH (ref 70–99)
POTASSIUM: 4.1 meq/L (ref 3.7–5.3)
Sodium: 141 mEq/L (ref 137–147)

## 2014-02-02 LAB — SURGICAL PCR SCREEN
MRSA, PCR: NEGATIVE
Staphylococcus aureus: NEGATIVE

## 2014-02-02 LAB — CBC
HEMATOCRIT: 44 % (ref 39.0–52.0)
HEMOGLOBIN: 14.3 g/dL (ref 13.0–17.0)
MCH: 28.9 pg (ref 26.0–34.0)
MCHC: 32.5 g/dL (ref 30.0–36.0)
MCV: 89.1 fL (ref 78.0–100.0)
Platelets: 278 10*3/uL (ref 150–400)
RBC: 4.94 MIL/uL (ref 4.22–5.81)
RDW: 14.6 % (ref 11.5–15.5)
WBC: 9.6 10*3/uL (ref 4.0–10.5)

## 2014-02-09 MED ORDER — CEFAZOLIN SODIUM-DEXTROSE 2-3 GM-% IV SOLR: 2.0000 g | INTRAVENOUS | Status: DC

## 2014-02-09 MED ORDER — DEXAMETHASONE SODIUM PHOSPHATE 10 MG/ML IJ SOLN
10.0000 mg | INTRAMUSCULAR | Status: AC
  Administered 2014-02-10: 10 mg via INTRAVENOUS
  Filled 2014-02-09: qty 1

## 2014-02-10 ENCOUNTER — Inpatient Hospital Stay (HOSPITAL_COMMUNITY)
Admission: RE | Admit: 2014-02-10 | Discharge: 2014-02-17 | DRG: 460 | Disposition: A | Discharge status: Home / Self Care | Payer: BC Managed Care – PPO | Source: Ambulatory Visit | Attending: Neurosurgery | Admitting: Neurosurgery

## 2014-02-10 ENCOUNTER — Encounter (HOSPITAL_COMMUNITY): Payer: Self-pay | Admitting: *Deleted

## 2014-02-10 ENCOUNTER — Encounter (HOSPITAL_COMMUNITY): Payer: BC Managed Care – PPO | Admitting: Anesthesiology

## 2014-02-10 ENCOUNTER — Encounter (HOSPITAL_COMMUNITY)
Admission: RE | Disposition: A | Discharge status: Home / Self Care | Payer: Disability Insurance | Source: Ambulatory Visit | Attending: Neurosurgery

## 2014-02-10 ENCOUNTER — Inpatient Hospital Stay (HOSPITAL_COMMUNITY): Payer: BC Managed Care – PPO

## 2014-02-10 ENCOUNTER — Inpatient Hospital Stay (HOSPITAL_COMMUNITY): Payer: BC Managed Care – PPO | Admitting: Anesthesiology

## 2014-02-10 DIAGNOSIS — IMO0002 Reserved for concepts with insufficient information to code with codable children: Secondary | ICD-10-CM

## 2014-02-10 DIAGNOSIS — Z981 Arthrodesis status: Secondary | ICD-10-CM

## 2014-02-10 DIAGNOSIS — E0500 Thyrotoxicosis with diffuse goiter without thyrotoxic crisis or storm: Secondary | ICD-10-CM | POA: Diagnosis present

## 2014-02-10 DIAGNOSIS — M48061 Spinal stenosis, lumbar region without neurogenic claudication: Principal | ICD-10-CM | POA: Diagnosis present

## 2014-02-10 DIAGNOSIS — Z87891 Personal history of nicotine dependence: Secondary | ICD-10-CM

## 2014-02-10 DIAGNOSIS — Z79899 Other long term (current) drug therapy: Secondary | ICD-10-CM

## 2014-02-10 DIAGNOSIS — R51 Headache: Secondary | ICD-10-CM | POA: Diagnosis present

## 2014-02-10 DIAGNOSIS — M069 Rheumatoid arthritis, unspecified: Secondary | ICD-10-CM | POA: Diagnosis present

## 2014-02-10 LAB — TYPE AND SCREEN
ABO/RH(D): A POS
Antibody Screen: POSITIVE

## 2014-02-10 SURGERY — POSTERIOR LUMBAR FUSION 1 WITH HARDWARE REMOVAL
Anesthesia: General | Site: Spine Lumbar

## 2014-02-10 MED ORDER — PHENOL 1.4 % MT LIQD
1.0000 | OROMUCOSAL | Status: DC | PRN
  Filled 2014-02-10: qty 177

## 2014-02-10 MED ORDER — FENTANYL CITRATE 0.05 MG/ML IJ SOLN
INTRAMUSCULAR | Status: AC
  Filled 2014-02-10: qty 5

## 2014-02-10 MED ORDER — CEFAZOLIN SODIUM 10 G IJ SOLR
3.0000 g | INTRAMUSCULAR | Status: DC
  Filled 2014-02-10: qty 3000

## 2014-02-10 MED ORDER — OXYCODONE-ACETAMINOPHEN 5-325 MG PO TABS
1.0000 | ORAL_TABLET | ORAL | Status: DC | PRN
  Administered 2014-02-10 – 2014-02-17 (×24): 2 via ORAL
  Filled 2014-02-10 (×26): qty 2

## 2014-02-10 MED ORDER — HYDROMORPHONE HCL PF 1 MG/ML IJ SOLN
0.2500 mg | INTRAMUSCULAR | Status: DC | PRN
  Administered 2014-02-10 (×4): 0.5 mg via INTRAVENOUS

## 2014-02-10 MED ORDER — VANCOMYCIN HCL 1000 MG IV SOLR
INTRAVENOUS | Status: DC | PRN
  Administered 2014-02-10: 1000 mg via TOPICAL

## 2014-02-10 MED ORDER — PANTOPRAZOLE SODIUM 40 MG IV SOLR
40.0000 mg | Freq: Every day | INTRAVENOUS | Status: DC
  Administered 2014-02-10: 40 mg via INTRAVENOUS
  Filled 2014-02-10: qty 40

## 2014-02-10 MED ORDER — MIDAZOLAM HCL 2 MG/2ML IJ SOLN
INTRAMUSCULAR | Status: AC
  Filled 2014-02-10: qty 2

## 2014-02-10 MED ORDER — PROMETHAZINE HCL 25 MG/ML IJ SOLN: 6.2500 mg | INTRAMUSCULAR | Status: DC | PRN

## 2014-02-10 MED ORDER — SODIUM CHLORIDE 0.9 % IR SOLN
Status: DC | PRN
  Administered 2014-02-10 (×2)

## 2014-02-10 MED ORDER — ACETAMINOPHEN 650 MG RE SUPP: 650.0000 mg | RECTAL | Status: DC | PRN

## 2014-02-10 MED ORDER — LIDOCAINE HCL (CARDIAC) 20 MG/ML IV SOLN
INTRAVENOUS | Status: DC | PRN
  Administered 2014-02-10: 100 mg via INTRAVENOUS

## 2014-02-10 MED ORDER — ONDANSETRON HCL 4 MG/2ML IJ SOLN
INTRAMUSCULAR | Status: AC
  Filled 2014-02-10: qty 2

## 2014-02-10 MED ORDER — ONDANSETRON HCL 4 MG/2ML IJ SOLN
INTRAMUSCULAR | Status: DC | PRN
  Administered 2014-02-10: 4 mg via INTRAVENOUS

## 2014-02-10 MED ORDER — SODIUM CHLORIDE 0.9 % IV SOLN: 250.0000 mL | INTRAVENOUS | Status: DC

## 2014-02-10 MED ORDER — PROPOFOL 10 MG/ML IV BOLUS
INTRAVENOUS | Status: AC
  Filled 2014-02-10: qty 20

## 2014-02-10 MED ORDER — ROCURONIUM BROMIDE 100 MG/10ML IV SOLN
INTRAVENOUS | Status: DC | PRN
  Administered 2014-02-10: 50 mg via INTRAVENOUS

## 2014-02-10 MED ORDER — SODIUM CHLORIDE 0.9 % IJ SOLN: 3.0000 mL | INTRAMUSCULAR | Status: DC | PRN

## 2014-02-10 MED ORDER — PREDNISONE 5 MG PO TABS
5.0000 mg | ORAL_TABLET | Freq: Every day | ORAL | Status: DC
  Administered 2014-02-10 – 2014-02-17 (×8): 5 mg via ORAL
  Filled 2014-02-10 (×8): qty 1

## 2014-02-10 MED ORDER — VECURONIUM BROMIDE 10 MG IV SOLR
INTRAVENOUS | Status: AC
  Filled 2014-02-10: qty 10

## 2014-02-10 MED ORDER — EPHEDRINE SULFATE 50 MG/ML IJ SOLN
INTRAMUSCULAR | Status: DC | PRN
  Administered 2014-02-10: 15 mg via INTRAVENOUS

## 2014-02-10 MED ORDER — LIDOCAINE HCL (CARDIAC) 20 MG/ML IV SOLN
INTRAVENOUS | Status: AC
  Filled 2014-02-10: qty 5

## 2014-02-10 MED ORDER — THROMBIN 20000 UNITS EX SOLR
CUTANEOUS | Status: DC | PRN
  Administered 2014-02-10 (×2): via TOPICAL

## 2014-02-10 MED ORDER — PROPOFOL 10 MG/ML IV BOLUS
INTRAVENOUS | Status: DC | PRN
  Administered 2014-02-10: 150 mg via INTRAVENOUS

## 2014-02-10 MED ORDER — ALUM & MAG HYDROXIDE-SIMETH 200-200-20 MG/5ML PO SUSP: 30.0000 mL | Freq: Four times a day (QID) | ORAL | Status: DC | PRN

## 2014-02-10 MED ORDER — BUPIVACAINE HCL (PF) 0.5 % IJ SOLN
INTRAMUSCULAR | Status: DC | PRN
  Administered 2014-02-10: 30 mL

## 2014-02-10 MED ORDER — CEFAZOLIN SODIUM-DEXTROSE 2-3 GM-% IV SOLR
2.0000 g | Freq: Three times a day (TID) | INTRAVENOUS | Status: AC
  Administered 2014-02-10 (×2): 2 g via INTRAVENOUS
  Filled 2014-02-10 (×2): qty 50

## 2014-02-10 MED ORDER — FENTANYL CITRATE 0.05 MG/ML IJ SOLN
1500.0000 ug | INTRAMUSCULAR | Status: DC | PRN
  Administered 2014-02-10 (×2): 3 ug/kg/h via INTRAVENOUS

## 2014-02-10 MED ORDER — VECURONIUM BROMIDE 10 MG IV SOLR
20.0000 mg | INTRAVENOUS | Status: DC | PRN
  Administered 2014-02-10: 0.1 mg/kg/h via INTRAVENOUS
  Administered 2014-02-10: 11:00:00 via INTRAVENOUS

## 2014-02-10 MED ORDER — KCL IN DEXTROSE-NACL 20-5-0.45 MEQ/L-%-% IV SOLN
80.0000 mL/h | INTRAVENOUS | Status: DC
  Administered 2014-02-10: 80 mL/h via INTRAVENOUS
  Filled 2014-02-10: qty 1000

## 2014-02-10 MED ORDER — VANCOMYCIN HCL 1000 MG IV SOLR
INTRAVENOUS | Status: AC
  Filled 2014-02-10: qty 1000

## 2014-02-10 MED ORDER — OXYCODONE HCL 5 MG/5ML PO SOLN: 5.0000 mg | Freq: Once | ORAL | Status: DC | PRN

## 2014-02-10 MED ORDER — SODIUM CHLORIDE 0.9 % IJ SOLN
3.0000 mL | Freq: Two times a day (BID) | INTRAMUSCULAR | Status: DC
  Administered 2014-02-11 – 2014-02-15 (×7): 3 mL via INTRAVENOUS

## 2014-02-10 MED ORDER — LACTATED RINGERS IV SOLN
INTRAVENOUS | Status: DC | PRN
  Administered 2014-02-10 (×5): via INTRAVENOUS

## 2014-02-10 MED ORDER — DEXAMETHASONE SODIUM PHOSPHATE 4 MG/ML IJ SOLN
4.0000 mg | Freq: Four times a day (QID) | INTRAMUSCULAR | Status: AC
  Administered 2014-02-10: 4 mg via INTRAVENOUS

## 2014-02-10 MED ORDER — GLYCOPYRROLATE 0.2 MG/ML IJ SOLN
INTRAMUSCULAR | Status: AC
  Filled 2014-02-10: qty 3

## 2014-02-10 MED ORDER — FENTANYL CITRATE 0.05 MG/ML IJ SOLN: INTRAMUSCULAR | Status: DC | PRN

## 2014-02-10 MED ORDER — ROCURONIUM BROMIDE 50 MG/5ML IV SOLN
INTRAVENOUS | Status: AC
  Filled 2014-02-10: qty 1

## 2014-02-10 MED ORDER — DEXAMETHASONE 4 MG PO TABS
4.0000 mg | ORAL_TABLET | Freq: Four times a day (QID) | ORAL | Status: AC
  Administered 2014-02-10: 4 mg via ORAL
  Filled 2014-02-10: qty 1

## 2014-02-10 MED ORDER — NEOSTIGMINE METHYLSULFATE 10 MG/10ML IV SOLN
INTRAVENOUS | Status: DC | PRN
  Administered 2014-02-10: 3 mg via INTRAVENOUS

## 2014-02-10 MED ORDER — OXYCODONE HCL 5 MG PO TABS: 5.0000 mg | ORAL_TABLET | Freq: Once | ORAL | Status: DC | PRN

## 2014-02-10 MED ORDER — CEFAZOLIN SODIUM 1-5 GM-% IV SOLN
INTRAVENOUS | Status: AC
  Administered 2014-02-10: 3 g via INTRAVENOUS
  Administered 2014-02-10: 2 g via INTRAVENOUS
  Administered 2014-02-10: 1 g via INTRAVENOUS
  Filled 2014-02-10: qty 50

## 2014-02-10 MED ORDER — DOCUSATE SODIUM 100 MG PO CAPS
100.0000 mg | ORAL_CAPSULE | Freq: Two times a day (BID) | ORAL | Status: DC
  Administered 2014-02-10 – 2014-02-17 (×13): 100 mg via ORAL
  Filled 2014-02-10 (×14): qty 1

## 2014-02-10 MED ORDER — NEOSTIGMINE METHYLSULFATE 10 MG/10ML IV SOLN
INTRAVENOUS | Status: AC
  Filled 2014-02-10: qty 1

## 2014-02-10 MED ORDER — SODIUM CHLORIDE 0.9 % IV SOLN
INTRAVENOUS | Status: DC | PRN
  Administered 2014-02-10: 13:00:00 via INTRAVENOUS

## 2014-02-10 MED ORDER — HYDROMORPHONE HCL PF 1 MG/ML IJ SOLN
INTRAMUSCULAR | Status: AC
  Filled 2014-02-10: qty 1

## 2014-02-10 MED ORDER — GLYCOPYRROLATE 0.2 MG/ML IJ SOLN
INTRAMUSCULAR | Status: DC | PRN
  Administered 2014-02-10: 0.6 mg via INTRAVENOUS

## 2014-02-10 MED ORDER — ACETAMINOPHEN 325 MG PO TABS
650.0000 mg | ORAL_TABLET | ORAL | Status: DC | PRN
  Administered 2014-02-13 (×2): 650 mg via ORAL
  Filled 2014-02-10 (×2): qty 2

## 2014-02-10 MED ORDER — ALBUMIN HUMAN 5 % IV SOLN
INTRAVENOUS | Status: DC | PRN
  Administered 2014-02-10 (×2): via INTRAVENOUS

## 2014-02-10 MED ORDER — VECURONIUM BROMIDE 10 MG IV SOLR: 50.0000 mg | INTRAVENOUS | Status: DC | PRN

## 2014-02-10 MED ORDER — MENTHOL 3 MG MT LOZG: 1.0000 | LOZENGE | OROMUCOSAL | Status: DC | PRN

## 2014-02-10 MED ORDER — MIDAZOLAM HCL 2 MG/2ML IJ SOLN
INTRAMUSCULAR | Status: DC | PRN
  Administered 2014-02-10: 2 mg via INTRAVENOUS

## 2014-02-10 MED ORDER — 0.9 % SODIUM CHLORIDE (POUR BTL) OPTIME
TOPICAL | Status: DC | PRN
  Administered 2014-02-10: 1000 mL

## 2014-02-10 MED ORDER — FENTANYL CITRATE 0.05 MG/ML IJ SOLN
INTRAMUSCULAR | Status: DC | PRN
  Administered 2014-02-10: 125 ug via INTRAVENOUS
  Administered 2014-02-10: 50 ug via INTRAVENOUS
  Administered 2014-02-10: 75 ug via INTRAVENOUS

## 2014-02-10 MED ORDER — ONDANSETRON HCL 4 MG/2ML IJ SOLN
4.0000 mg | INTRAMUSCULAR | Status: DC | PRN
  Administered 2014-02-13 – 2014-02-15 (×3): 4 mg via INTRAVENOUS
  Filled 2014-02-10 (×3): qty 2

## 2014-02-10 MED ORDER — VECURONIUM BROMIDE 10 MG IV SOLR
INTRAVENOUS | Status: AC
  Filled 2014-02-10: qty 20

## 2014-02-10 MED ORDER — HYDROMORPHONE HCL PF 1 MG/ML IJ SOLN
1.0000 mg | INTRAMUSCULAR | Status: DC | PRN
  Administered 2014-02-10 (×3): 1.5 mg via INTRAMUSCULAR
  Administered 2014-02-11 – 2014-02-14 (×6): 1 mg via INTRAMUSCULAR
  Filled 2014-02-10: qty 2
  Filled 2014-02-10 (×3): qty 1
  Filled 2014-02-10: qty 2
  Filled 2014-02-10 (×4): qty 1
  Filled 2014-02-10: qty 2

## 2014-02-10 MED ORDER — EPHEDRINE SULFATE 50 MG/ML IJ SOLN
INTRAMUSCULAR | Status: AC
  Filled 2014-02-10: qty 1

## 2014-02-10 SURGICAL SUPPLY — 76 items
BAG DECANTER FOR FLEXI CONT (MISCELLANEOUS) ×4 IMPLANT
BENZOIN TINCTURE PRP APPL 2/3 (GAUZE/BANDAGES/DRESSINGS) ×2 IMPLANT
BLADE 10 SAFETY STRL DISP (BLADE) IMPLANT
BLADE SURG ROTATE 9660 (MISCELLANEOUS) IMPLANT
BONE EQUIVA 10CC (Bone Implant) ×2 IMPLANT
BRUSH SCRUB EZ PLAIN DRY (MISCELLANEOUS) ×2 IMPLANT
BUR CUTTER 7.0 ROUND (BURR) ×4 IMPLANT
BUR MATCHSTICK NEURO 3.0 LAGG (BURR) ×2 IMPLANT
CAGE PEEK OPTIMA ARDIS 11X9X26 (Cage) ×4 IMPLANT
CANISTER SUCT 3000ML (MISCELLANEOUS) ×2 IMPLANT
CONT SPEC 4OZ CLIKSEAL STRL BL (MISCELLANEOUS) ×4 IMPLANT
COVER BACK TABLE 24X17X13 BIG (DRAPES) IMPLANT
COVER TABLE BACK 60X90 (DRAPES) ×2 IMPLANT
DERMABOND ADHESIVE PROPEN (GAUZE/BANDAGES/DRESSINGS)
DERMABOND ADVANCED (GAUZE/BANDAGES/DRESSINGS)
DERMABOND ADVANCED .7 DNX12 (GAUZE/BANDAGES/DRESSINGS) IMPLANT
DERMABOND ADVANCED .7 DNX6 (GAUZE/BANDAGES/DRESSINGS) IMPLANT
DRAPE C-ARM 42X72 X-RAY (DRAPES) ×6 IMPLANT
DRAPE LAPAROTOMY 100X72X124 (DRAPES) ×2 IMPLANT
DRAPE SURG 17X23 STRL (DRAPES) ×4 IMPLANT
DRSG OPSITE 4X5.5 SM (GAUZE/BANDAGES/DRESSINGS) ×2 IMPLANT
DRSG OPSITE POSTOP 4X10 (GAUZE/BANDAGES/DRESSINGS) ×2 IMPLANT
DRSG OPSITE POSTOP 4X6 (GAUZE/BANDAGES/DRESSINGS) IMPLANT
DRSG TELFA 3X8 NADH (GAUZE/BANDAGES/DRESSINGS) IMPLANT
DURAPREP 26ML APPLICATOR (WOUND CARE) ×2 IMPLANT
ELECT BLADE 4.0 EZ CLEAN MEGAD (MISCELLANEOUS) ×2
ELECT REM PT RETURN 9FT ADLT (ELECTROSURGICAL) ×2
ELECTRODE BLDE 4.0 EZ CLN MEGD (MISCELLANEOUS) ×1 IMPLANT
ELECTRODE REM PT RTRN 9FT ADLT (ELECTROSURGICAL) ×1 IMPLANT
EVACUATOR 1/8 PVC DRAIN (DRAIN) ×2 IMPLANT
GAUZE SPONGE 4X4 16PLY XRAY LF (GAUZE/BANDAGES/DRESSINGS) ×4 IMPLANT
GLOVE BIO SURGEON STRL SZ7 (GLOVE) ×4 IMPLANT
GLOVE BIO SURGEON STRL SZ8.5 (GLOVE) ×2 IMPLANT
GLOVE BIOGEL PI IND STRL 7.5 (GLOVE) ×1 IMPLANT
GLOVE BIOGEL PI IND STRL 8 (GLOVE) ×3 IMPLANT
GLOVE BIOGEL PI INDICATOR 7.5 (GLOVE) ×1
GLOVE BIOGEL PI INDICATOR 8 (GLOVE) ×3
GLOVE ECLIPSE 7.5 STRL STRAW (GLOVE) ×8 IMPLANT
GLOVE ECLIPSE 8.0 STRL XLNG CF (GLOVE) ×6 IMPLANT
GLOVE SS BIOGEL STRL SZ 7 (GLOVE) ×2 IMPLANT
GLOVE SS BIOGEL STRL SZ 8 (GLOVE) ×1 IMPLANT
GLOVE SUPERSENSE BIOGEL SZ 7 (GLOVE) ×2
GLOVE SUPERSENSE BIOGEL SZ 8 (GLOVE) ×1
GOWN STRL REUS W/ TWL LRG LVL3 (GOWN DISPOSABLE) ×1 IMPLANT
GOWN STRL REUS W/ TWL XL LVL3 (GOWN DISPOSABLE) ×2 IMPLANT
GOWN STRL REUS W/TWL 2XL LVL3 (GOWN DISPOSABLE) ×4 IMPLANT
GOWN STRL REUS W/TWL LRG LVL3 (GOWN DISPOSABLE) ×1
GOWN STRL REUS W/TWL XL LVL3 (GOWN DISPOSABLE) ×2
KIT BASIN OR (CUSTOM PROCEDURE TRAY) ×2 IMPLANT
KIT ROOM TURNOVER OR (KITS) ×2 IMPLANT
MILL MEDIUM DISP (BLADE) ×2 IMPLANT
NEEDLE HYPO 22GX1.5 SAFETY (NEEDLE) ×2 IMPLANT
NS IRRIG 1000ML POUR BTL (IV SOLUTION) ×2 IMPLANT
PACK LAMINECTOMY NEURO (CUSTOM PROCEDURE TRAY) ×2 IMPLANT
PAD ARMBOARD 7.5X6 YLW CONV (MISCELLANEOUS) ×10 IMPLANT
PATTIES SURGICAL .75X.75 (GAUZE/BANDAGES/DRESSINGS) IMPLANT
RASP 3.0MM (RASP) ×4 IMPLANT
ROD PATHFINDER 50MM (Rod) ×4 IMPLANT
SCREW MIN INVASIVE 6.5X45 (Screw) ×4 IMPLANT
SCREW PATHFINDER 7.5X40MM (Screw) ×4 IMPLANT
SPONGE GAUZE 4X4 12PLY (GAUZE/BANDAGES/DRESSINGS) IMPLANT
SPONGE LAP 4X18 X RAY DECT (DISPOSABLE) IMPLANT
SPONGE SURGIFOAM ABS GEL 100 (HEMOSTASIS) ×2 IMPLANT
STRIP CLOSURE SKIN 1/2X4 (GAUZE/BANDAGES/DRESSINGS) IMPLANT
SUT PROLENE 0 CT 1 30 (SUTURE) ×6 IMPLANT
SUT VIC AB 0 CT1 18XCR BRD8 (SUTURE) ×2 IMPLANT
SUT VIC AB 0 CT1 8-18 (SUTURE) ×2
SUT VIC AB 2-0 OS6 18 (SUTURE) ×10 IMPLANT
SUT VIC AB 3-0 CP2 18 (SUTURE) ×4 IMPLANT
SYR 20ML ECCENTRIC (SYRINGE) ×2 IMPLANT
TOP CLSR SEQUOIA (Orthopedic Implant) ×8 IMPLANT
TOWEL OR 17X24 6PK STRL BLUE (TOWEL DISPOSABLE) ×2 IMPLANT
TOWEL OR 17X26 10 PK STRL BLUE (TOWEL DISPOSABLE) ×2 IMPLANT
TRAP SPECIMEN MUCOUS 40CC (MISCELLANEOUS) ×2 IMPLANT
TRAY FOLEY CATH 14FRSI W/METER (CATHETERS) IMPLANT
WATER STERILE IRR 1000ML POUR (IV SOLUTION) ×2 IMPLANT

## 2014-02-10 NOTE — Anesthesia Procedure Notes (Signed)
Procedure Name: Intubation Date/Time: 02/10/2014 7:32 AM Performed by: Marinda Elk A Pre-anesthesia Checklist: Patient identified, Timeout performed, Emergency Drugs available, Suction available and Patient being monitored Patient Re-evaluated:Patient Re-evaluated prior to inductionOxygen Delivery Method: Circle system utilized Preoxygenation: Pre-oxygenation with 100% oxygen Intubation Type: IV induction Ventilation: Mask ventilation without difficulty Laryngoscope Size: Mac and 3 Grade View: Grade II Tube type: Oral Tube size: 8.0 mm Number of attempts: 1 Airway Equipment and Method: Stylet Placement Confirmation: ETT inserted through vocal cords under direct vision,  breath sounds checked- equal and bilateral and positive ETCO2 Secured at: 22 cm Tube secured with: Tape Dental Injury: Teeth and Oropharynx as per pre-operative assessment

## 2014-02-10 NOTE — Progress Notes (Signed)
Patient ID: Willie Howe, male   DOB: 18-Apr-1961, 53 y.o.   MRN: [id number] Post op check;  Awake, alert No new neuro issues Wound clean and dry. Stable post op check

## 2014-02-10 NOTE — Transfer of Care (Signed)
Immediate Anesthesia Transfer of Care Note  Patient: Willie Howe  Procedure(s) Performed: Procedure(s): LUMBAR THREE-FOUR POSTERIOR LUMBAR INTERBODY FUSION WITH PEDICLE SCREWS,REMOVAL OF OLD HARDWARE. (N/A)  Patient Location: PACU  Anesthesia Type:General  Level of Consciousness: sedated  Airway & Oxygen Therapy: Patient Spontanous Breathing and Patient connected to face mask oxygen  Post-op Assessment: Report given to PACU RN and Post -op Vital signs reviewed and stable  Post vital signs: Reviewed and stable  Complications: No apparent anesthesia complications

## 2014-02-10 NOTE — Anesthesia Postprocedure Evaluation (Signed)
Anesthesia Post Note  Patient: Willie Howe  Procedure(s) Performed: Procedure(s) (LRB): LUMBAR THREE-FOUR POSTERIOR LUMBAR INTERBODY FUSION WITH PEDICLE SCREWS,REMOVAL OF OLD HARDWARE. (N/A)  Anesthesia type: general  Patient location: PACU  Post pain: Pain level controlled  Post assessment: Patient's Cardiovascular Status Stable  Last Vitals:  Filed Vitals:   02/10/14 1445  BP:   Pulse: 105  Temp:   Resp: 15    Post vital signs: Reviewed and stable  Level of consciousness: sedated  Complications: No apparent anesthesia complications

## 2014-02-10 NOTE — H&P (Signed)
Willie Howe is an 53 y.o. male.   Chief Complaint: Back pain into the left and right leg HPI: The patient is a 53 year old gentleman who was evaluated for back pain going down his legs more so on the left than the right. Some scar for number of months it originally seen last fall. Back in 1998 Had 3 back surgeries a relatively short period of time. The first 2 were done. Rate eventually had a third surgery done elsewhere with a fusion from L4-S1. He did fairly well through the years ago some intermittent back aches. Back in the fall he began to develop severe pain on the left leg was then developed pulse on the right. Quite severe and unrelenting. Patella takes methotrexate and prednisone for rheumatoid arthritis which was diagnosed. Increases prednisone which gave him no relief. An MRI scan was done he was evaluated in the office. Because of his instrumentation placed the MRI scan was extremity difficulty interpreting. He therefore underwent myelography which showed adjacent level disease at L3-4 with a near complete block on myelogram severe spinal stenosis. After discussing the options the patient requested surgery and now comes for extension of his fusion. I had a long discussion with him regarding the risks and benefits of surgical intervention. The risks discussed include but are limited to bleeding infection weakness numbness paralysis trouble with instrumentation nonunion coma and death. We have discussed alternative methods of therapy although risks and benefits of nonintervention. He's had the opportunity to ask numerous questions and appears to understand. With this information in place has requested we proceed with surgery.  Past Medical History  Diagnosis Date  . Arthritis   . Thyroid disease     Graves Disease  . Previous back surgery     x 3  . Kidney stone   . Dysrhythmia   . Anginal pain     Past Surgical History  Procedure Laterality Date  . Knee surgery      x 2  .  Nephrolithotomy    . Back surgery    . Colonoscopy      History reviewed. No pertinent family history. Social History:  reports that he quit smoking about 19 years ago. His smoking use included Cigarettes. He has a 30 pack-year smoking history. He has never used smokeless tobacco. He reports that he does not drink alcohol or use illicit drugs.  Allergies: No Known Allergies  Medications Prior to Admission  Medication Sig Dispense Refill  . folic acid (FOLVITE) 1 MG tablet Take 1 mg by mouth daily.      . methotrexate (RHEUMATREX) 2.5 MG tablet Take 20 mg by mouth once a week. 8 tablets on Friday. Caution:Chemotherapy. Protect from light.      Marland Kitchen oxyCODONE-acetaminophen (PERCOCET/ROXICET) 5-325 MG per tablet Take 2 tablets by mouth every 4 (four) hours as needed for moderate pain or severe pain.      . predniSONE (DELTASONE) 5 MG tablet Take 5 mg by mouth daily.         Results for orders placed during the hospital encounter of 02/10/14 (from the past 48 hour(s))  TYPE AND SCREEN     Status: None   Collection Time    02/10/14  6:12 AM      Result Value Ref Range   ABO/RH(D) A POS     Antibody Screen POS     Sample Expiration 02/13/2014     No results found.  Positive for back and leg pain as well as joint  pain and swelling of his joints  Blood pressure 132/84, pulse 72, temperature 97.9 F (36.6 C), temperature source Oral, resp. rate 18, height 6' 3.5" (1.918 m), weight 122.018 kg (269 lb), SpO2 100.00%.  The patient is awake alert and oriented. His no facial asymmetry. His gait is slow mildly antalgic. History is 1+ reflexes which are equal bilaterally. His strength is 5 over 5 Assessment/Plan Impression is that of a we'll disease L3-4. The plan is for extension of his fusion.  Faythe Ghee, MD 02/10/2014, 7:26 AM

## 2014-02-10 NOTE — Anesthesia Preprocedure Evaluation (Addendum)
Anesthesia Evaluation  Patient identified by MRN, date of birth, ID band Patient awake    Reviewed: Allergy & Precautions, H&P , NPO status , Patient's Chart, lab work & pertinent test results  History of Anesthesia Complications Negative for: history of anesthetic complications  Airway Mallampati: II TM Distance: >3 FB Neck ROM: Full    Dental  (+) Dental Advisory Given, Partial Upper   Pulmonary former smoker,    Pulmonary exam normal       Cardiovascular + angina + dysrhythmias     Neuro/Psych negative psych ROS   GI/Hepatic negative GI ROS, Neg liver ROS,   Endo/Other  negative endocrine ROS  Renal/GU negative Renal ROS     Musculoskeletal   Abdominal   Peds  Hematology negative hematology ROS (+)   Anesthesia Other Findings   Reproductive/Obstetrics                         Anesthesia Physical Anesthesia Plan  ASA: II  Anesthesia Plan: General   Post-op Pain Management:    Induction: Intravenous  Airway Management Planned: Oral ETT  Additional Equipment:   Intra-op Plan:   Post-operative Plan: Extubation in OR  Informed Consent: I have reviewed the patients History and Physical, chart, labs and discussed the procedure including the risks, benefits and alternatives for the proposed anesthesia with the patient or authorized representative who has indicated his/her understanding and acceptance.   Dental advisory given  Plan Discussed with: CRNA, Anesthesiologist and Surgeon  Anesthesia Plan Comments:        Anesthesia Quick Evaluation

## 2014-02-10 NOTE — Op Note (Signed)
Preop diagnosis: Adjacent level spinal stenosis L3-4 with severe myelographic block and central and lateral recess stenosis Postop diagnosis: Same Procedure: Removal of hardware L4-5 L3-4 decompressive laminectomy for relief of the central and lateral recess stenosis with decompression of L3 and L4 nerve roots more so than needed for interbody fusion Bilateral L3-4 microdiscectomy L3-4 posterior lumbar interbody fusion with peek interbody spacer L3-4 nonsegmental instrumentation with Pathfinder pedicle screw system L3 for posterolateral fusion  Surgeon Veterinary surgeon: Arnoldo Morale  After being placed the prone position the patient's back was prepped and draped in usual sterile fashion. Previous lumbar incision was opened and carried down to the spinous processes superior to the previous decompression. Identify the spinous processes and subperiosteal bilaterally on the spinous processes and lamina. We used fluoroscopy to identify the appropriate level and exposed the spinous processes lamina facet joint and far lateral region including transverse processes of L3 bilaterally. We then dissected the soft tissue to identify the previous instrumentation. We're able to identify the screws at L4 and L5 bilaterally but the S1 screws were completely encased in bone. We felt that there is no way to we could remove the entire instrumentation. We therefore identified the plate between the L4 and L5 screws. We then transected the plates with a high-speed metalcutting bur. We then removed the knots of the top of the L4 screws had backed the screw out. We replaced these with 7.5 x 40 mm screws. These were Pathfinder screws. We then did our bilateral decompression at L3-4 bilaterally by removing the inferior 80% of the L3 lamina the medial three quarters of the facet joint and the residual superior L4 lamina. We decompressed thoroughly the hypertrophic ligamentum flavum and removed to decompress the underlying spinal dura.  We identified a tract of the L3 and L4 nerve root bilaterally more so than needed for interbody fusion. We then coagulated on the disc and cleaned out thoroughly bilaterally with pituitary rongeurs and curettes. Thorough displaced cleanout was carried out while the same time great care was taken to avoid injury to the neural elements and this was successfully done. We then prepared the disc for interbody fusion. We distracted up to 11 mm size which we felt was a good choice. We felt to cages with a mixture of autologous bone morselized allograft. We prepared the disc space for interbody fusion and impacted the cage without difficulty. Prior to placing the second cage placed a mixture of autologous bone and morselized allograft it with the interspace to help with interbody fusion. These were followed in good position by fluoroscopy. We then placed pedicle screws at L3 bilaterally. We made a drill hole entry point passed the pedicle all tapped with a 6.0 mm tap and placed 6.5 x 45 mm screws bilaterally. These were followed in excellent position and confirmed in AP lateral fluoroscopy. We then decorticated the far lateral region for posterior lateral fusion placed a mixture of autologous bone morselized allograft. We chose appropriately length rod and secured them to the top of the screws did tightening and final final tightening with torque and counter torque. We then irrigated the wound copiously controlled any bleeding with upper coagulation Gelfoam. Left a drain in the epidural space and brought out through a separate stab incision. We then closed with multiple layers of Vicryl on the muscle fascia subcutaneous and subcuticular tissues. We did a running locking Prolene on the skin. Shortness was then applied the patient was extubated and taken to recovery room in stable condition.

## 2014-02-11 MED ORDER — PANTOPRAZOLE SODIUM 40 MG PO TBEC
40.0000 mg | DELAYED_RELEASE_TABLET | Freq: Every day | ORAL | Status: DC
  Administered 2014-02-11 – 2014-02-17 (×7): 40 mg via ORAL
  Filled 2014-02-11 (×7): qty 1

## 2014-02-11 MED ORDER — BISACODYL 10 MG RE SUPP
10.0000 mg | Freq: Every day | RECTAL | Status: DC | PRN
  Administered 2014-02-11: 10 mg via RECTAL
  Filled 2014-02-11 (×2): qty 1

## 2014-02-11 MED ORDER — SENNA 8.6 MG PO TABS
2.0000 | ORAL_TABLET | Freq: Every evening | ORAL | Status: DC | PRN
  Administered 2014-02-12: 17.2 mg via ORAL
  Filled 2014-02-11: qty 2

## 2014-02-11 NOTE — Progress Notes (Signed)
Patient ID: Willie Howe, male   DOB: 1960-11-20, 53 y.o.   MRN: [id number] Subjective:  The patient is alert and pleasant. His back is appropriately sore.  Objective: Vital signs in last 24 hours: Temp:  [97.2 F (36.2 C)-98.2 F (36.8 C)] 98 F (36.7 C) (06/27 0522) Pulse Rate:  [91-121] 91 (06/27 0522) Resp:  [13-19] 18 (06/27 0522) BP: (102-171)/(61-91) 112/68 mmHg (06/27 0522) SpO2:  [84 %-100 %] 98 % (06/27 0522) Weight:  [129.411 kg (285 lb 4.8 oz)] 129.411 kg (285 lb 4.8 oz) (06/26 1554)  Intake/Output from previous day: 06/26 0701 - 06/27 0700 In: 5100 [I.V.:4300; Blood:300; IV Piggyback:500] Out: 2197 [Urine:2720; Drains:565; Blood:1300] Intake/Output this shift:    Physical exam the patient is alert and oriented. His strength is grossly normal his lower extremities.  Lab Results: No results found for this basename: WBC, HGB, HCT, PLT,  in the last 72 hours BMET No results found for this basename: NA, K, CL, CO2, GLUCOSE, BUN, CREATININE, CALCIUM,  in the last 72 hours  Studies/Results: Dg Lumbar Spine 2-3 Views  02/10/2014   CLINICAL DATA:  lumbar fusion  EXAM: LUMBAR SPINE - 2-3 VIEW; DG C-ARM 61-120 MIN  COMPARISON:  CT 07/22/2013  FINDINGS: Two intraoperative fluoroscopic spot images document extension of PLIF across the L3-4 interspace. Bilateral pedicle screws and vertical interconnecting rods. Graft markers in the interspace. Stable hardware at L5 and S1.  IMPRESSION: 1. Extension of PLIF across L3-4.   Electronically Signed   By: Arne Cleveland M.D.   On: 02/10/2014 13:16   Dg C-arm 61-120 Min  02/10/2014   CLINICAL DATA:  lumbar fusion  EXAM: LUMBAR SPINE - 2-3 VIEW; DG C-ARM 61-120 MIN  COMPARISON:  CT 07/22/2013  FINDINGS: Two intraoperative fluoroscopic spot images document extension of PLIF across the L3-4 interspace. Bilateral pedicle screws and vertical interconnecting rods. Graft markers in the interspace. Stable hardware at L5 and S1.  IMPRESSION: 1.  Extension of PLIF across L3-4.   Electronically Signed   By: Arne Cleveland M.D.   On: 02/10/2014 13:16    Assessment/Plan: Postop day 1: The patient is doing well. We will mobilize him.  LOS: 1 day     JENKINS,JEFFREY D 02/11/2014, 9:11 AM

## 2014-02-11 NOTE — Evaluation (Signed)
Physical Therapy Evaluation Patient Details Name: Willie Howe MRN: [id number] DOB: 03/03/1961 Today's Date: 02/11/2014   History of Present Illness  Pt is a 53 y/o male admitted s/p L3-4 posterior lumbar fusion with removal of old hardware.  Clinical Impression  This patient presents with acute pain and decreased functional independence following the above mentioned procedure. At the time of PT eval, pt was able to ambulate 125 feet and negotiate 5 steps. Overall min guard assist required. This patient is appropriate for skilled PT interventions to address functional limitations, improve safety and independence with functional mobility, and return to PLOF.      Follow Up Recommendations No PT follow up;Supervision for mobility/OOB    Equipment Recommendations  Rolling walker with 5" wheels;3in1 (PT)    Recommendations for Other Services       Precautions / Restrictions Precautions Precautions: Fall;Back Precaution Booklet Issued: Yes (comment) Precaution Comments: Reviewed precautions Required Braces or Orthoses: Spinal Brace Spinal Brace: Lumbar corset;Applied in sitting position Restrictions Weight Bearing Restrictions: No      Mobility  Bed Mobility Overal bed mobility: Needs Assistance Bed Mobility: Rolling;Sidelying to Sit Rolling: Supervision Sidelying to sit: Supervision       General bed mobility comments: VC's for sequencing and technique for log roll. No physical assist required.   Transfers Overall transfer level: Needs assistance Equipment used: Rolling walker (2 wheeled) Transfers: Sit to/from Stand Sit to Stand: Min guard         General transfer comment: VC's for hand placement on seated surface for safety. Tactile cueing to maintain proper posture and back precautions during transfer to standing.   Ambulation/Gait Ambulation/Gait assistance: Supervision Ambulation Distance (Feet): 125 Feet Assistive device: Rolling walker (2 wheeled) Gait  Pattern/deviations: Step-through pattern;Decreased stride length;Narrow base of support Gait velocity: Decreased Gait velocity interpretation: Below normal speed for age/gender General Gait Details: Pt required VC's for improved posture and to maintain back precautions. Tolerated gait training well.   Stairs Stairs: Yes Stairs assistance: Min guard Stair Management: Two rails;Step to pattern;Forwards Number of Stairs: 5 General stair comments: Pt cannot reach both rails at home, however wanted to practice with 2-rail support.   Wheelchair Mobility    Modified Rankin (Stroke Patients Only)       Balance Overall balance assessment: No apparent balance deficits (not formally assessed)                                           Pertinent Vitals/Pain Vitals stable throughout session. Pt reports pain as minimal.     Home Living Family/patient expects to be discharged to:: Private residence Living Arrangements: Spouse/significant other;Children Available Help at Discharge: Family;Available PRN/intermittently Type of Home: House Home Access: Stairs to enter Entrance Stairs-Rails: Psychiatric nurse of Steps: 5 Home Layout: One level Home Equipment: Cane - single point      Prior Function Level of Independence: Independent with assistive device(s)               Hand Dominance   Dominant Hand: Right    Extremity/Trunk Assessment   Upper Extremity Assessment: Defer to OT evaluation           Lower Extremity Assessment: Overall WFL for tasks assessed;RLE deficits/detail RLE Deficits / Details: Noted significant R knee varus - pt favors RLE during ambulation and stairs.    Cervical / Trunk Assessment: Normal  Communication  Communication: No difficulties  Cognition Arousal/Alertness: Awake/alert Behavior During Therapy: WFL for tasks assessed/performed Overall Cognitive Status: Within Functional Limits for tasks assessed                       General Comments      Exercises        Assessment/Plan    PT Assessment Patient needs continued PT services  PT Diagnosis Difficulty walking;Acute pain   PT Problem List Decreased strength;Decreased range of motion;Decreased activity tolerance;Decreased balance;Decreased mobility;Decreased knowledge of use of DME;Decreased safety awareness;Decreased knowledge of precautions;Pain  PT Treatment Interventions DME instruction;Gait training;Functional mobility training;Stair training;Therapeutic activities;Therapeutic exercise;Neuromuscular re-education;Patient/family education   PT Goals (Current goals can be found in the Care Plan section) Acute Rehab PT Goals Patient Stated Goal: not stated PT Goal Formulation: With patient/family Time For Goal Achievement: 02/18/14 Potential to Achieve Goals: Good    Frequency Min 5X/week   Barriers to discharge        Co-evaluation               End of Session Equipment Utilized During Treatment: Back brace Activity Tolerance: Patient tolerated treatment well Patient left: in chair;with call bell/phone within reach;with family/visitor present Nurse Communication: Mobility status         Time: 1550-1615 PT Time Calculation (min): 25 min   Charges:   PT Evaluation $Initial PT Evaluation Tier I: 1 Procedure PT Treatments $Gait Training: 8-22 mins   PT G CodesJolyn Lent 02/11/2014, 5:02 PM  Jolyn Lent, PT, DPT Acute Rehabilitation Services Pager: [phone]

## 2014-02-11 NOTE — Progress Notes (Signed)
Emptied 200 ml from the hemovac; continue to monitor drainage before discontinuing the drain.

## 2014-02-11 NOTE — Evaluation (Signed)
Occupational Therapy Evaluation Patient Details Name: Willie Howe MRN: [id number] DOB: May 16, 1961 Today's Date: 02/11/2014    History of Present Illness 53 y.o. s/p LUMBAR THREE-FOUR POSTERIOR LUMBAR INTERBODY FUSION WITH PEDICLE SCREWS,REMOVAL OF OLD HARDWARE   Clinical Impression   Pt s/p above and presents with below problem list. Pt moving well during session. Feel pt will benefit from acute OT to increase independence prior to d/c.     Follow Up Recommendations  No OT follow up;Supervision - Intermittent    Equipment Recommendations  3 in 1 bedside comode;Other (comment) (AE)    Recommendations for Other Services       Precautions / Restrictions Precautions Precautions: Back Precaution Comments: Reviewed precautions Required Braces or Orthoses: Spinal Brace Spinal Brace: Lumbar corset;Applied in sitting position Restrictions Weight Bearing Restrictions: No      Mobility Bed Mobility Overal bed mobility: Needs Assistance Bed Mobility: Rolling;Sidelying to Sit Rolling: Supervision Sidelying to sit: Supervision       General bed mobility comments: Cues for technique.  Transfers Overall transfer level: Needs assistance Equipment used: Rolling walker (2 wheeled) Transfers: Sit to/from Stand Sit to Stand: Min guard         General transfer comment: Cues for technique.     Balance                                            ADL Overall ADL's : Needs assistance/impaired     Grooming: Wash/dry hands;Wash/dry face;Oral care;Set up;Supervision/safety;Standing           Upper Body Dressing : Minimal assistance;Sitting;Standing (brace-adjusted standing)   Lower Body Dressing: Maximal assistance;Sit to/from stand   Toilet Transfer: Min guard;Ambulation;BSC;RW           Functional mobility during ADLs: Min guard;Rolling walker General ADL Comments: Educated on use of toilet aid if pt would need it for hygiene. Educated on AE for  ADLs. Educated on back brace. Educated on use of cup for teeth care and placement of grooming items to avoid breaking precautions. Educated on safety tips (rugs, shoes, bag on walker, sitting for LB ADLs).     Vision                     Perception     Praxis      Pertinent Vitals/Pain Pain 7/10. Notified nurse. Repositioned.      Hand Dominance Right   Extremity/Trunk Assessment Upper Extremity Assessment Upper Extremity Assessment: Overall WFL for tasks assessed   Lower Extremity Assessment Lower Extremity Assessment: Defer to PT evaluation       Communication Communication Communication: No difficulties   Cognition Arousal/Alertness: Awake/alert Behavior During Therapy: WFL for tasks assessed/performed Overall Cognitive Status: Within Functional Limits for tasks assessed                     General Comments       Exercises       Shoulder Instructions      Home Living Family/patient expects to be discharged to:: Private residence Living Arrangements: Spouse/significant other;Children Available Help at Discharge: Family;Available PRN/intermittently Type of Home: House Home Access: Stairs to enter [employer] of Steps: 5 Entrance Stairs-Rails: Right;Left Home Layout: One level     Bathroom Shower/Tub: Teacher, early years/pre: Standard     Home Equipment: Cane - single point  Prior Functioning/Environment Level of Independence: Independent with assistive device(s)             OT Diagnosis: Acute pain   OT Problem List: Decreased strength;Decreased range of motion;Decreased knowledge of use of DME or AE;Decreased knowledge of precautions;Pain   OT Treatment/Interventions: Self-care/ADL training;DME and/or AE instruction;Therapeutic activities;Patient/family education;Balance training    OT Goals(Current goals can be found in the care plan section) Acute Rehab OT Goals Patient Stated Goal: not  stated OT Goal Formulation: With patient Time For Goal Achievement: 02/18/14 Potential to Achieve Goals: Good ADL Goals Pt Will Perform Lower Body Bathing: with adaptive equipment;with modified independence;sit to/from stand Pt Will Perform Lower Body Dressing: with modified independence;with adaptive equipment;sit to/from stand Pt Will Transfer to Toilet: with modified independence;ambulating (3 in 1 over commode) Pt Will Perform Toileting - Clothing Manipulation and hygiene: with modified independence;sit to/from stand Pt Will Perform Tub/Shower Transfer: Tub transfer;with supervision;ambulating;rolling walker (tub equipment tbd)  OT Frequency: Min 2X/week   Barriers to D/C:            Co-evaluation              End of Session Equipment Utilized During Treatment: Gait belt;Rolling walker;Back brace Nurse Communication: Mobility status;Other (comment) (pain level)  Activity Tolerance: Patient tolerated treatment well Patient left: in chair;with call bell/phone within reach;with family/visitor present   Time: 0837-0909 OT Time Calculation (min): 32 min Charges:  OT General Charges $OT Visit: 1 Procedure OT Evaluation $Initial OT Evaluation Tier I: 1 Procedure OT Treatments $Self Care/Home Management : 8-22 mins G-CodesBenito Mccreedy OTR/L [phone] 02/11/2014, 9:36 AM

## 2014-02-12 LAB — TYPE AND SCREEN
ABO/RH(D): A POS
ANTIBODY SCREEN: POSITIVE
DAT, IGG: NEGATIVE
PT AG TYPE: NEGATIVE
UNIT DIVISION: 0
UNIT DIVISION: 0
Unit division: 0

## 2014-02-12 LAB — POCT I-STAT 4, (NA,K, GLUC, HGB,HCT)
GLUCOSE: 124 mg/dL — AB (ref 70–99)
HEMATOCRIT: 40 % (ref 39.0–52.0)
Hemoglobin: 13.6 g/dL (ref 13.0–17.0)
Potassium: 4.2 mEq/L (ref 3.7–5.3)
Sodium: 139 mEq/L (ref 137–147)

## 2014-02-12 MED ORDER — DIAZEPAM 5 MG PO TABS
5.0000 mg | ORAL_TABLET | Freq: Four times a day (QID) | ORAL | Status: DC | PRN
  Administered 2014-02-12 – 2014-02-13 (×3): 5 mg via ORAL
  Filled 2014-02-12 (×3): qty 1

## 2014-02-12 NOTE — Progress Notes (Signed)
Utilization Review Completed.Willie Howe T6/28/2015  

## 2014-02-12 NOTE — Progress Notes (Addendum)
Occupational Therapy Treatment Patient Details Name: Willie Howe MRN: [id number] DOB: [birth year] Today's Date: 02/12/2014    History of present illness Pt is a 53 y/o male admitted s/p L3-4 posterior lumbar fusion with removal of old hardware.   OT comments  Pt moving well during session. Practiced tub transfer and with AE for LB ADLs.   Follow Up Recommendations  No OT follow up;Supervision - Intermittent    Equipment Recommendations  3 in 1 bedside comode;Other (comment) (AE)    Recommendations for Other Services      Precautions / Restrictions Precautions Precautions: Back Precaution Booklet Issued:  (one in room) Precaution Comments: Reviewed precautions Required Braces or Orthoses: Spinal Brace Spinal Brace: Lumbar corset;Applied in sitting position Restrictions Weight Bearing Restrictions: No       Mobility Bed Mobility Overal bed mobility: Needs Assistance Bed Mobility: Rolling;Sit to Sidelying;Sidelying to Sit Rolling: Supervision Sidelying to sit: Supervision     Sit to sidelying: Min guard General bed mobility comments: Cues to reinforce technique.  Transfers Overall transfer level: Needs assistance Equipment used: Rolling walker (2 wheeled) Transfers: Sit to/from Stand Sit to Stand: Supervision              Balance                                   ADL Overall ADL's : Needs assistance/impaired     Grooming: Wash/dry face;Oral care;Supervision/safety;Standing           Upper Body Dressing : Supervision/safety;Sitting (back brace)   Lower Body Dressing: Min guard;Sit to/from stand;With adaptive equipment   Toilet Transfer: Supervision/safety;Ambulation;RW (bed)       Tub/ Shower Transfer: Min guard;Ambulation;Shower seat;Rolling walker   Functional mobility during ADLs: Supervision/safety;Rolling walker;Min guard (Min guard for tub transfer; cues to relax shoulders ) General ADL Comments: Reviewed safety tips (bag on  walker, rugs, safe shoewear, sitting for most of LB ADLs). Recommended spouse be with him for tub transfer. Reviewed back brace information. Reviewed placement of grooming items-cues for precautions at sink.  Educated on tub transfer techniques and pt practiced. Discussed shower chair options.  Pt practiced LB dressing with AE. Discussed precautions during session and incorporating them into activities. Discussed position of pillow when sleeping. Pt states he was able to perform hygiene while maintaining precautions.      Vision                     Perception     Praxis      Cognition   Behavior During Therapy: [facility] for tasks assessed/performed Overall Cognitive Status: Within Functional Limits for tasks assessed                       Extremity/Trunk Assessment               Exercises     Shoulder Instructions       General Comments      Pertinent Vitals/ Pain       Pain 8/10. Repositioned.  Home Living                                          Prior Functioning/Environment              Frequency Min 2X/week  Progress Toward Goals  OT Goals(current goals can now be found in the care plan section)  Progress towards OT goals: Progressing toward goals  Acute Rehab OT Goals Patient Stated Goal: not stated OT Goal Formulation: With patient Time For Goal Achievement: 02/18/14 Potential to Achieve Goals: Good ADL Goals Pt Will Perform Lower Body Bathing: with adaptive equipment;with modified independence;sit to/from stand Pt Will Perform Lower Body Dressing: with modified independence;with adaptive equipment;sit to/from stand Pt Will Transfer to Toilet: with modified independence;ambulating (3 in 1 over commode) Pt Will Perform Toileting - Clothing Manipulation and hygiene: with modified independence;sit to/from stand Pt Will Perform Tub/Shower Transfer: Tub transfer;with supervision;ambulating;rolling walker (tub equipment  tbd)  Plan Discharge plan remains appropriate    Co-evaluation                 End of Session Equipment Utilized During Treatment: Gait belt;Rolling walker;Back brace   Activity Tolerance Patient tolerated treatment well   Patient Left in bed;with call bell/phone within reach   Nurse Communication          Time: 9292-4462 OT Time Calculation (min): 26 min  Charges: OT General Charges $OT Visit: 1 Procedure OT Treatments $Self Care/Home Management : 8-22 mins $Therapeutic Activity: 8-22 mins  Benito Mccreedy OTR/L [phone] 02/12/2014, 9:22 AM

## 2014-02-12 NOTE — Progress Notes (Signed)
Patient ID: Willie Howe, male   DOB: March 12, 1961, 53 y.o.   MRN: [id number] Subjective:  This patient is alert and pleasant. He looks well.  Objective: Vital signs in last 24 hours: Temp:  [97.5 F (36.4 C)-98.2 F (36.8 C)] 98.2 F (36.8 C) (06/28 0524) Pulse Rate:  [81-103] 81 (06/28 0524) Resp:  [18-20] 19 (06/28 0524) BP: (119-134)/(64-81) 119/77 mmHg (06/28 0524) SpO2:  [98 %-99 %] 99 % (06/28 0524)  Intake/Output from previous day: 06/27 0701 - 06/28 0700 In: -  Out: 615 [Urine:250; Drains:365] Intake/Output this shift: Total I/O In: 220 [P.O.:120; Other:100] Out: -   Physical exam patient is alert and oriented. His strength is grossly normal his lower extremities.  Lab Results: No results found for this basename: WBC, HGB, HCT, PLT,  in the last 72 hours BMET No results found for this basename: NA, K, CL, CO2, GLUCOSE, BUN, CREATININE, CALCIUM,  in the last 72 hours  Studies/Results: Dg Lumbar Spine 2-3 Views  02/10/2014   CLINICAL DATA:  lumbar fusion  EXAM: LUMBAR SPINE - 2-3 VIEW; DG C-ARM 61-120 MIN  COMPARISON:  CT 07/22/2013  FINDINGS: Two intraoperative fluoroscopic spot images document extension of PLIF across the L3-4 interspace. Bilateral pedicle screws and vertical interconnecting rods. Graft markers in the interspace. Stable hardware at L5 and S1.  IMPRESSION: 1. Extension of PLIF across L3-4.   Electronically Signed   By: Arne Cleveland M.D.   On: 02/10/2014 13:16   Dg C-arm 61-120 Min  02/10/2014   CLINICAL DATA:  lumbar fusion  EXAM: LUMBAR SPINE - 2-3 VIEW; DG C-ARM 61-120 MIN  COMPARISON:  CT 07/22/2013  FINDINGS: Two intraoperative fluoroscopic spot images document extension of PLIF across the L3-4 interspace. Bilateral pedicle screws and vertical interconnecting rods. Graft markers in the interspace. Stable hardware at L5 and S1.  IMPRESSION: 1. Extension of PLIF across L3-4.   Electronically Signed   By: Arne Cleveland M.D.   On: 02/10/2014 13:16     Assessment/Plan: Postop day #2: The patient is doing well. We will discontinue his Hemovac drain. He may go home tomorrow.  LOS: 2 days     JENKINS,JEFFREY D 02/12/2014, 9:47 AM

## 2014-02-13 MED ORDER — CYCLOBENZAPRINE HCL 5 MG PO TABS
7.5000 mg | ORAL_TABLET | Freq: Three times a day (TID) | ORAL | Status: DC | PRN
  Administered 2014-02-13 – 2014-02-15 (×3): 7.5 mg via ORAL
  Filled 2014-02-13 (×3): qty 1.5

## 2014-02-13 MED FILL — Heparin Sodium (Porcine) Inj 1000 Unit/ML: INTRAMUSCULAR | Qty: 30 | Status: AC

## 2014-02-13 MED FILL — Sodium Chloride IV Soln 0.9%: INTRAVENOUS | Qty: 3000 | Status: AC

## 2014-02-13 NOTE — Progress Notes (Signed)
OT Cancellation Note  Patient Details Name: Willie Howe MRN: [id number] DOB: [birth year]   Cancelled Treatment:    Reason Eval/Treat Not Completed: Medical issues which prohibited therapy pt with nausea.  RN made aware.  Will re attempt tomorrow.  Darlina Rumpf [place], OTR/L [phone]  02/13/2014, 2:13 PM

## 2014-02-13 NOTE — Progress Notes (Signed)
Patient ID: Willie Howe, male   DOB: 01/04/1961, 53 y.o.   MRN: [id number] Afeb, vss No new neuro issues Just not feeling very well today, and thinks it might be the valium Wound checked and looks excellent Will just keep him another day and reassessd things tomorrow.

## 2014-02-13 NOTE — Progress Notes (Signed)
PT Cancellation Note  Patient Details Name: MINGO SIEGERT MRN: [id number] DOB: [birth year]   Cancelled Treatment:    Reason Eval/Treat Not Completed: Medical issues which prohibited therapy.  Pt still nauseated and fatigued.  Will see as able 6/30. 02/13/2014  Donnella Sham, [place] [phone]  (pager)   [place], [name] 02/13/2014, 5:01 PM

## 2014-02-14 MED ORDER — DEXAMETHASONE SODIUM PHOSPHATE 4 MG/ML IJ SOLN
6.0000 mg | Freq: Four times a day (QID) | INTRAMUSCULAR | Status: DC
  Administered 2014-02-14 – 2014-02-15 (×3): 6 mg via INTRAVENOUS
  Filled 2014-02-14 (×4): qty 2

## 2014-02-14 MED ORDER — HYDROMORPHONE HCL 2 MG PO TABS
1.0000 mg | ORAL_TABLET | ORAL | Status: DC | PRN
  Administered 2014-02-14 – 2014-02-17 (×10): 1 mg via ORAL
  Filled 2014-02-14 (×11): qty 1

## 2014-02-14 NOTE — Progress Notes (Signed)
PT Cancellation Note  Patient Details Name: Willie Howe MRN: [id number] DOB: November 29, 1960   Cancelled Treatment:    Reason Eval/Treat Not Completed: Medical issues which prohibited therapy.  Attempted to see pt 2 separate times today to give him a chance to improve.  Pt still feeling poorly and can not participate. 02/14/2014  Donnella Sham, [place] [phone]  (pager)   Oletta Buehring, [name] 02/14/2014, 5:23 PM

## 2014-02-14 NOTE — Progress Notes (Signed)
OT Cancellation Note  Patient Details Name: Willie Howe MRN: [id number] DOB: [birth year]   Cancelled Treatment:    Reason Eval/Treat Not Completed: Pain limiting ability to participate;Medical issues which prohibited therapy                                           Pt. Reports 10/10 HA with meds given around 12:00 with no relief, MD paged by RN and arrived to meet with pt. While i was in room.                                           Will check back as pt. Able to tolerate.                                             Daiva Huge [place],COTA/L 02/14/2014, 2:47 PM

## 2014-02-14 NOTE — Progress Notes (Signed)
Patient ID: Willie Howe, male   DOB: 12/02/1960, 53 y.o.   MRN: [id number] Afeb, vss He has a bad headache. It gets a bit better when he lays flat, but no a lot.  His wound looks excellent with no swelling or redness or drainage. He has no neuro deficits.  I am not sure what this headache is about. There was no dural tear at surgery noted, and he has no meningeal signs. I am still going to get an MRI to rule out csf leakage. I am going to give him a few doses of decadron in case he is having some sort of chemical reaction. Will continue present close observation.

## 2014-02-14 NOTE — Progress Notes (Signed)
Pt has a headache 10/10, PRN medications given. Dr. Hal Neer was paged.

## 2014-02-15 ENCOUNTER — Inpatient Hospital Stay (HOSPITAL_COMMUNITY): Payer: BC Managed Care – PPO

## 2014-02-15 MED ORDER — GADOBENATE DIMEGLUMINE 529 MG/ML IV SOLN
20.0000 mL | Freq: Once | INTRAVENOUS | Status: AC | PRN
  Administered 2014-02-15: 20 mL via INTRAVENOUS

## 2014-02-15 MED ORDER — DEXAMETHASONE 4 MG PO TABS
4.0000 mg | ORAL_TABLET | Freq: Four times a day (QID) | ORAL | Status: DC
  Administered 2014-02-15 – 2014-02-16 (×4): 4 mg via ORAL
  Filled 2014-02-15 (×2): qty 1

## 2014-02-15 NOTE — Progress Notes (Signed)
Patient ID: Willie Howe, male   DOB: 10-17-1960, 53 y.o.   MRN: [id number] Afeb, vss No new neuro issues. He says his headache is 50% better, and he is elevating his head this morning in bed. His MRI has not yet been done, and I am checking on the reason why. He is on chronic steroids, and he may have not been able to mount a response to the recent stresses. Will check MRI once it is done.

## 2014-02-15 NOTE — Progress Notes (Signed)
Physical Therapy Treatment Patient Details Name: Willie Howe MRN: [id number] DOB: [birth year] Today's Date: 02/15/2014    History of Present Illness Pt is a 53 y/o male admitted s/p L3-4 posterior lumbar fusion with removal of old hardware.    PT Comments    Finally able to participate after 2 days feeling too poorly.  Reinforced all education and emphasized progressive ambulation.  Follow Up Recommendations  No PT follow up;Supervision for mobility/OOB     Equipment Recommendations  Rolling walker with 5" wheels;3in1 (PT)    Recommendations for Other Services       Precautions / Restrictions Precautions Precautions: Back Precaution Comments: Reviewed precautions Required Braces or Orthoses: Spinal Brace Spinal Brace: Lumbar corset;Applied in sitting position Restrictions Weight Bearing Restrictions: No    Mobility  Bed Mobility Overal bed mobility: Needs Assistance Bed Mobility: Rolling;Sidelying to Sit Rolling: Supervision Sidelying to sit: Min guard       General bed mobility comments: Cues to reinforce technique.  Transfers Overall transfer level: Needs assistance Equipment used: Rolling walker (2 wheeled) Transfers: Sit to/from Stand Sit to Stand: From elevated surface;Min guard         General transfer comment: VC's for hand placement on seated surface for safety. Tactile cueing to maintain proper posture and back precautions during transfer to standing.   Ambulation/Gait Ambulation/Gait assistance: Supervision Ambulation Distance (Feet): 350 Feet Assistive device: Rolling walker (2 wheeled) Gait Pattern/deviations: Step-through pattern;Trunk flexed;Antalgic Gait velocity: Decreased   General Gait Details: weak-kneed gait, mildly unsteady and antalgic   Stairs            Wheelchair Mobility    Modified Rankin (Stroke Patients Only)       Balance Overall balance assessment: No apparent balance deficits (not formally assessed)                                  Cognition Arousal/Alertness: Awake/alert Behavior During Therapy: WFL for tasks assessed/performed Overall Cognitive Status: Within Functional Limits for tasks assessed                      Exercises      General Comments        Pertinent Vitals/Pain Some continuing L hip/buttock pain    Home Living                      Prior Function            PT Goals (current goals can now be found in the care plan section) Acute Rehab PT Goals Patient Stated Goal: not stated PT Goal Formulation: With patient/family Time For Goal Achievement: 02/18/14 Potential to Achieve Goals: Good Progress towards PT goals: Progressing toward goals    Frequency  Min 5X/week    PT Plan Current plan remains appropriate    Co-evaluation             End of Session Equipment Utilized During Treatment: Back brace Activity Tolerance: Patient tolerated treatment well Patient left: in chair;with call bell/phone within reach;with family/visitor present     Time: 3382-5053 PT Time Calculation (min): 25 min  Charges:  $Gait Training: 8-22 mins $Self Care/Home Management: 8-22                    G Codes:      Klea Nall, [name] 02/15/2014, 5:05 PM 02/15/2014  Donnella Sham, [place] [phone]  (  pager)

## 2014-02-16 MED ORDER — BISACODYL 5 MG PO TBEC
5.0000 mg | DELAYED_RELEASE_TABLET | Freq: Every day | ORAL | Status: DC | PRN
  Administered 2014-02-16: 5 mg via ORAL
  Filled 2014-02-16: qty 1

## 2014-02-16 MED ORDER — BISACODYL 10 MG RE SUPP: 10.0000 mg | Freq: Once | RECTAL | Status: DC

## 2014-02-16 MED ORDER — DEXAMETHASONE 2 MG PO TABS
2.0000 mg | ORAL_TABLET | Freq: Three times a day (TID) | ORAL | Status: AC
  Administered 2014-02-16 – 2014-02-17 (×4): 2 mg via ORAL
  Filled 2014-02-16 (×4): qty 1

## 2014-02-16 MED ORDER — MAGNESIUM CITRATE PO SOLN
1.0000 | Freq: Once | ORAL | Status: AC
  Administered 2014-02-16: 1 via ORAL
  Filled 2014-02-16: qty 296

## 2014-02-16 NOTE — Progress Notes (Signed)
Physical Therapy Treatment Patient Details Name: Willie Howe MRN: [id number] DOB: Dec 29, 1960 Today's Date: 02/16/2014    History of Present Illness Pt is a 53 y/o male admitted s/p L3-4 posterior lumbar fusion with removal of old hardware.    PT Comments    All needs met.  Pt has met or exceeded goals.  All education has been reinforced with pt and wife.  No further PT needs.  Will sign off.  Follow Up Recommendations  No PT follow up;Supervision for mobility/OOB     Equipment Recommendations  Other (comment) (supposed to be getting RW)    Recommendations for Other Services       Precautions / Restrictions Precautions Precautions: Back Precaution Comments: Reviewed precautions Required Braces or Orthoses: Spinal Brace Spinal Brace: Lumbar corset;Applied in sitting position Restrictions Weight Bearing Restrictions: No    Mobility  Bed Mobility Overal bed mobility: Needs Assistance Bed Mobility: Rolling;Sidelying to Sit;Sit to Sidelying Rolling: Modified independent (Device/Increase time)         General bed mobility comments: Good technique  Transfers   Equipment used: Rolling walker (2 wheeled) Transfers: Sit to/from Stand Sit to Stand: Supervision         General transfer comment: cues for hand placement  Ambulation/Gait Ambulation/Gait assistance: Supervision Ambulation Distance (Feet): 600 Feet Assistive device: Rolling walker (2 wheeled) Gait Pattern/deviations: WFL(Within Functional Limits) Gait velocity: Decreased   General Gait Details: much steadier gait with use of RW.   Stairs            Wheelchair Mobility    Modified Rankin (Stroke Patients Only)       Balance Overall balance assessment: No apparent balance deficits (not formally assessed)                                  Cognition Arousal/Alertness: Awake/alert Behavior During Therapy: WFL for tasks assessed/performed Overall Cognitive Status: Within  Functional Limits for tasks assessed                      Exercises      General Comments        Pertinent Vitals/Pain Some back pain, managed by P.O meds.     Home Living                      Prior Function            PT Goals (current goals can now be found in the care plan section) Acute Rehab PT Goals PT Goal Formulation: With patient/family Time For Goal Achievement: 02/18/14 Potential to Achieve Goals: Good Progress towards PT goals: Progressing toward goals    Frequency  Min 5X/week    PT Plan Current plan remains appropriate    Co-evaluation             End of Session Equipment Utilized During Treatment: Back brace Activity Tolerance: Patient tolerated treatment well Patient left: in chair;with call bell/phone within reach;with family/visitor present     Time: 1833-5825 PT Time Calculation (min): 23 min  Charges:  $Gait Training: 8-22 mins $Self Care/Home Management: 8-22                    G Codes:      Illyanna Petillo, [name] 02/16/2014, 3:28 PM 02/16/2014  Donnella Sham, PT [phone]  (pager)

## 2014-02-16 NOTE — Progress Notes (Signed)
Dr Hal Neer gave telephone orders for a RW (no 3 - in -1). Orders placed in epic

## 2014-02-16 NOTE — Progress Notes (Signed)
Patient ID: Willie Howe, male   DOB: 1961/01/09, 53 y.o.   MRN: [id number] Afeb, vss Feels much better. Ambulated yesterday without issue. Headache minimal Will need to have BM prior to d/c. If he does that this morning, he could be released this afternoon.

## 2014-02-16 NOTE — Progress Notes (Signed)
Patient reported a large BM after the mag citrate this morning, however he feels that due to his high pain level today and the fact that his wife's car just broke down, that he would be more comfortable discharging tomorrow. Pain meds given. Message left with Dr Sande Rives secretary with this info.

## 2014-02-16 NOTE — Progress Notes (Signed)
Occupational Therapy Treatment Patient Details Name: Willie Howe MRN: [id number] DOB: 02-08-1961 Today's Date: 02/16/2014    History of present illness Pt is a 53 y/o male admitted s/p L3-4 posterior lumbar fusion with removal of old hardware.   OT comments  Pt making progress with functional goals and planing to d/c home tomorrow  Follow Up Recommendations  No OT follow up;Supervision - Intermittent    Equipment Recommendations  3 in 1 bedside comode    Recommendations for Other Services      Precautions / Restrictions Precautions Precautions: Back Precaution Comments: Reviewed precautions Required Braces or Orthoses: Spinal Brace Spinal Brace: Lumbar corset;Applied in sitting position Restrictions Weight Bearing Restrictions: No       Mobility Bed Mobility               General bed mobility comments: pt in recliner  Transfers                      Balance Overall balance assessment: No apparent balance deficits (not formally assessed)                                 ADL Overall ADL's : Needs assistance/impaired     Grooming: Wash/dry face;Supervision/safety;Standing;Wash/dry hands       Lower Body Bathing: Min guard;Set up;Supervison/ safety;Sit to/from stand;With adaptive equipment       Lower Body Dressing: Min guard;Sit to/from stand;With adaptive equipment;Set up   Toilet Transfer: Supervision/safety;Ambulation;RW   Toileting- Clothing Manipulation and Hygiene: Supervision/safety;Sit to/from stand   Tub/ Banker: Ambulation;Rolling walker;Supervision/safety;Grab bars;3 in 1   Functional mobility during ADLs: Supervision/safety;Rolling walker        Vision  no change from baseline per pt report                   Perception Perception Perception Tested?: No   Praxis Praxis Praxis tested?: Not tested    Cognition   Behavior During Therapy: [facility] for tasks assessed/performed Overall Cognitive  Status: Within Functional Limits for tasks assessed                                                 General Comments  Pt very pleasant and cooperative    Pertinent Vitals/ Pain       6/10 back pain, VSS                                            Prior Functioning/Environment  independent           Frequency Min 2X/week     Progress Toward Goals  OT Goals(current goals can now be found in the care plan section)  Progress towards OT goals: Progressing toward goals     Plan Discharge plan remains appropriate                     End of Session Equipment Utilized During Treatment: Rolling walker;Back brace;Other (comment) (3 in 1)   Activity Tolerance Patient tolerated treatment well   Patient Left with call bell/phone within reach;in chair   Nurse Communication          Time:  3474-2595 OT Time Calculation (min): 19 min  Charges: OT General Charges $OT Visit: 1 Procedure OT Treatments $Self Care/Home Management : 8-22 mins  Britt Bottom 02/16/2014, 2:53 PM

## 2014-02-17 MED ORDER — CYCLOBENZAPRINE HCL 7.5 MG PO TABS: 7.5000 mg | ORAL_TABLET | Freq: Three times a day (TID) | ORAL | Status: DC | PRN

## 2014-02-17 MED ORDER — OXYCODONE-ACETAMINOPHEN 5-325 MG PO TABS: 1.0000 | ORAL_TABLET | ORAL | Status: DC | PRN

## 2014-02-17 NOTE — Discharge Summary (Signed)
  Physician Discharge Summary  Patient ID: Willie Howe MRN: [id number] DOB/AGE: 12-29-1960 53 y.o.  Admit date: 02/10/2014 Discharge date: 02/17/2014  Admission Diagnoses:  Discharge Diagnoses:  Active Problems:   Lumbar spinal stenosis   Discharged Condition: good  Hospital Course: Surgery one week ago for plif extension. Did well, but developed bad headache after 3 days. Treated with steroids, and got much better. MRI done that did not look suspicious for csf leak. Increased activity steadily, and ready for d/c pod 7. Home with specific instructions given.  Consults: None  Significant Diagnostic Studies: none  Treatments: surgery: L 34 plif  Discharge Exam: Blood pressure 130/82, pulse 97, temperature 98.2 F (36.8 C), temperature source Oral, resp. rate 20, height 6\' 3"  (1.905 m), weight 129.411 kg (285 lb 4.8 oz), SpO2 97.00%. Incision/Wound:clean and dry; no new neuro issues  Disposition: 01-Home or Self Care     Medication List    ASK your doctor about these medications       folic acid 1 MG tablet  Commonly known as:  FOLVITE  Take 1 mg by mouth daily.     methotrexate 2.5 MG tablet  Commonly known as:  RHEUMATREX  - Take 20 mg by mouth once a week. 8 tablets on Friday.  - Caution:Chemotherapy. Protect from light.     oxyCODONE-acetaminophen 5-325 MG per tablet  Commonly known as:  PERCOCET/ROXICET  Take 2 tablets by mouth every 4 (four) hours as needed for moderate pain or severe pain.     predniSONE 5 MG tablet  Commonly known as:  DELTASONE  Take 5 mg by mouth daily.         At home rest most of the time. Get up 9 or 10 times each day and take a 15 or 20 minute walk. No riding in the car and to your first postoperative appointment. If you have neck surgery you may shower from the chest down starting on the third postoperative day. If you had back surgery he may start showering on the third postoperative day with saran wrap wrapped around your  incisional area 3 times. After the shower remove the saran wrap. Take pain medicine as needed and other medications as instructed. Call my office for an appointment.  SignedFaythe Ghee, MD 02/17/2014, 11:22 AM

## 2014-02-17 NOTE — Progress Notes (Signed)
Pt had an episode of urine incontinence without knowing. Pt says he's being fine after that. He has voided two times already without any problem, which his wife emptied the urinal. Will continue to monitor.

## 2014-02-20 ENCOUNTER — Encounter (HOSPITAL_COMMUNITY): Payer: Self-pay | Admitting: Emergency Medicine

## 2014-02-20 ENCOUNTER — Inpatient Hospital Stay (HOSPITAL_COMMUNITY)
Admission: EM | Admit: 2014-02-20 | Discharge: 2014-03-11 | DRG: 028 | Disposition: A | Discharge status: Home / Self Care | Payer: BC Managed Care – PPO | Attending: Neurosurgery | Admitting: Neurosurgery

## 2014-02-20 ENCOUNTER — Emergency Department (HOSPITAL_COMMUNITY): Payer: BC Managed Care – PPO

## 2014-02-20 DIAGNOSIS — R519 Headache, unspecified: Secondary | ICD-10-CM | POA: Diagnosis present

## 2014-02-20 DIAGNOSIS — Z87891 Personal history of nicotine dependence: Secondary | ICD-10-CM

## 2014-02-20 DIAGNOSIS — J9601 Acute respiratory failure with hypoxia: Secondary | ICD-10-CM

## 2014-02-20 DIAGNOSIS — R112 Nausea with vomiting, unspecified: Secondary | ICD-10-CM | POA: Diagnosis present

## 2014-02-20 DIAGNOSIS — R51 Headache: Secondary | ICD-10-CM | POA: Diagnosis present

## 2014-02-20 DIAGNOSIS — R55 Syncope and collapse: Secondary | ICD-10-CM

## 2014-02-20 DIAGNOSIS — G4489 Other headache syndrome: Secondary | ICD-10-CM

## 2014-02-20 DIAGNOSIS — I4891 Unspecified atrial fibrillation: Secondary | ICD-10-CM

## 2014-02-20 DIAGNOSIS — IMO0002 Reserved for concepts with insufficient information to code with codable children: Secondary | ICD-10-CM

## 2014-02-20 DIAGNOSIS — G9600 Cerebrospinal fluid leak, unspecified: Secondary | ICD-10-CM

## 2014-02-20 DIAGNOSIS — I498 Other specified cardiac arrhythmias: Secondary | ICD-10-CM | POA: Diagnosis not present

## 2014-02-20 DIAGNOSIS — M069 Rheumatoid arthritis, unspecified: Secondary | ICD-10-CM | POA: Diagnosis present

## 2014-02-20 DIAGNOSIS — A498 Other bacterial infections of unspecified site: Secondary | ICD-10-CM | POA: Diagnosis present

## 2014-02-20 DIAGNOSIS — J9600 Acute respiratory failure, unspecified whether with hypoxia or hypercapnia: Secondary | ICD-10-CM | POA: Diagnosis not present

## 2014-02-20 DIAGNOSIS — Y831 Surgical operation with implant of artificial internal device as the cause of abnormal reaction of the patient, or of later complication, without mention of misadventure at the time of the procedure: Secondary | ICD-10-CM | POA: Diagnosis present

## 2014-02-20 DIAGNOSIS — N390 Urinary tract infection, site not specified: Secondary | ICD-10-CM | POA: Diagnosis present

## 2014-02-20 DIAGNOSIS — I2699 Other pulmonary embolism without acute cor pulmonale: Secondary | ICD-10-CM

## 2014-02-20 DIAGNOSIS — G988 Other disorders of nervous system: Principal | ICD-10-CM | POA: Diagnosis present

## 2014-02-20 DIAGNOSIS — N179 Acute kidney failure, unspecified: Secondary | ICD-10-CM | POA: Diagnosis not present

## 2014-02-20 DIAGNOSIS — G9340 Encephalopathy, unspecified: Secondary | ICD-10-CM | POA: Diagnosis not present

## 2014-02-20 DIAGNOSIS — I824Z9 Acute embolism and thrombosis of unspecified deep veins of unspecified distal lower extremity: Secondary | ICD-10-CM | POA: Diagnosis not present

## 2014-02-20 DIAGNOSIS — I2692 Saddle embolus of pulmonary artery without acute cor pulmonale: Secondary | ICD-10-CM | POA: Diagnosis not present

## 2014-02-20 DIAGNOSIS — I4510 Unspecified right bundle-branch block: Secondary | ICD-10-CM | POA: Diagnosis present

## 2014-02-20 DIAGNOSIS — Z6832 Body mass index (BMI) 32.0-32.9, adult: Secondary | ICD-10-CM

## 2014-02-20 DIAGNOSIS — Z79899 Other long term (current) drug therapy: Secondary | ICD-10-CM

## 2014-02-20 LAB — URINALYSIS, ROUTINE W REFLEX MICROSCOPIC
Bilirubin Urine: NEGATIVE
Glucose, UA: NEGATIVE mg/dL
HGB URINE DIPSTICK: NEGATIVE
Ketones, ur: NEGATIVE mg/dL
Nitrite: POSITIVE — AB
PH: 7 (ref 5.0–8.0)
Protein, ur: NEGATIVE mg/dL
SPECIFIC GRAVITY, URINE: 1.02 (ref 1.005–1.030)
UROBILINOGEN UA: 1 mg/dL (ref 0.0–1.0)

## 2014-02-20 LAB — COMPREHENSIVE METABOLIC PANEL
ALK PHOS: 116 U/L (ref 39–117)
ALT: 69 U/L — AB (ref 0–53)
AST: 23 U/L (ref 0–37)
Albumin: 3.2 g/dL — ABNORMAL LOW (ref 3.5–5.2)
Anion gap: 14 (ref 5–15)
BUN: 11 mg/dL (ref 6–23)
CO2: 26 meq/L (ref 19–32)
Calcium: 9.3 mg/dL (ref 8.4–10.5)
Chloride: 95 mEq/L — ABNORMAL LOW (ref 96–112)
Creatinine, Ser: 0.73 mg/dL (ref 0.50–1.35)
GFR calc non Af Amer: >90 mL/min (ref >90)
GLUCOSE: 129 mg/dL — AB (ref 70–99)
Potassium: 4.5 mEq/L (ref 3.7–5.3)
SODIUM: 135 meq/L — AB (ref 137–147)
TOTAL PROTEIN: 7.6 g/dL (ref 6.0–8.3)
Total Bilirubin: 0.4 mg/dL (ref 0.3–1.2)

## 2014-02-20 LAB — CBC WITH DIFFERENTIAL/PLATELET
Basophils Absolute: 0 10*3/uL (ref 0.0–0.1)
Basophils Relative: 0 % (ref 0–1)
EOS ABS: 0.1 10*3/uL (ref 0.0–0.7)
Eosinophils Relative: 1 % (ref 0–5)
HCT: 38.2 % — ABNORMAL LOW (ref 39.0–52.0)
Hemoglobin: 12.8 g/dL — ABNORMAL LOW (ref 13.0–17.0)
LYMPHS ABS: 2.1 10*3/uL (ref 0.7–4.0)
LYMPHS PCT: 12 % (ref 12–46)
MCH: 29.4 pg (ref 26.0–34.0)
MCHC: 33.5 g/dL (ref 30.0–36.0)
MCV: 87.6 fL (ref 78.0–100.0)
Monocytes Absolute: 1.2 10*3/uL — ABNORMAL HIGH (ref 0.1–1.0)
Monocytes Relative: 7 % (ref 3–12)
NEUTROS PCT: 80 % — AB (ref 43–77)
Neutro Abs: 14.5 10*3/uL — ABNORMAL HIGH (ref 1.7–7.7)
Platelets: 426 10*3/uL — ABNORMAL HIGH (ref 150–400)
RBC: 4.36 MIL/uL (ref 4.22–5.81)
RDW: 14.2 % (ref 11.5–15.5)
WBC: 17.9 10*3/uL — AB (ref 4.0–10.5)

## 2014-02-20 LAB — URINE MICROSCOPIC-ADD ON

## 2014-02-20 LAB — I-STAT CG4 LACTIC ACID, ED: Lactic Acid, Venous: 1.26 mmol/L (ref 0.5–2.2)

## 2014-02-20 MED ORDER — HYDROMORPHONE HCL PF 1 MG/ML IJ SOLN
1.0000 mg | INTRAMUSCULAR | Status: AC
  Administered 2014-02-20: 1 mg via INTRAVENOUS
  Filled 2014-02-20: qty 1

## 2014-02-20 MED ORDER — SODIUM CHLORIDE 0.9 % IV BOLUS (SEPSIS)
1000.0000 mL | Freq: Once | INTRAVENOUS | Status: AC
  Administered 2014-02-20: 1000 mL via INTRAVENOUS

## 2014-02-20 MED ORDER — ONDANSETRON HCL 4 MG/2ML IJ SOLN
4.0000 mg | Freq: Once | INTRAMUSCULAR | Status: AC
  Administered 2014-02-20: 4 mg via INTRAVENOUS
  Filled 2014-02-20: qty 2

## 2014-02-20 MED ORDER — DEXTROSE 5 % IV SOLN
1.0000 g | Freq: Once | INTRAVENOUS | Status: AC
  Administered 2014-02-21: 1 g via INTRAVENOUS
  Filled 2014-02-20: qty 10

## 2014-02-20 NOTE — ED Notes (Signed)
Clayton Bibles -PA notified of CG-4 results

## 2014-02-20 NOTE — ED Notes (Signed)
Presents post back fusion, dischrarged July 3rd, developed fever 102.0 last night and increase in dizziness and headache that began today. Associated with nausea and vomiting. Pt is visibly uncomfortable. seous sanguinous drainage from incision. Pt is alert. Oriented.

## 2014-02-20 NOTE — ED Provider Notes (Signed)
CSN: [id number]     Arrival date & time 02/20/14  2000 History   First MD Initiated Contact with Patient 02/20/14 2109     Chief Complaint  Patient presents with  . Post-op Problem     (Consider location/radiation/quality/duration/timing/severity/associated sxs/prior Treatment) HPI  Patient with hx lumbar surgery 02/10/14, discharged 02/17/14 p/w headache, N/V, fever to 102, back pain.  States he did not feel great for the past 2 days but today his symptoms became intense.  Headache is 10/10 pain, "indescribable," but feels similar to HA he had postoperatively in hospital.  Back pain is unchanged 7.5-8/10 intensity, clear red discharge from car ride over, no other discharge.  No other wound concerns.  Took his first shower yesterday, did not use saran wrap.  Headache is improved very temporarily with lying flat.  Denies CP, SOB, cough, urinary symptoms.   Past Medical History  Diagnosis Date  . Arthritis   . Thyroid disease     Graves Disease  . Previous back surgery     x 3  . Kidney stone   . Dysrhythmia   . Anginal pain    Past Surgical History  Procedure Laterality Date  . Knee surgery      x 2  . Nephrolithotomy    . Back surgery    . Colonoscopy     History reviewed. No pertinent family history. History  Substance Use Topics  . Smoking status: Former Smoker -- 1.50 packs/day for 20 years    Types: Cigarettes    Quit date: 02/03/1995  . Smokeless tobacco: Never Used  . Alcohol Use: No    Review of Systems  All other systems reviewed and are negative.     Allergies  Review of patient's allergies indicates no known allergies.  Home Medications   Prior to Admission medications   Medication Sig Start Date End Date Taking? Authorizing Provider  cyclobenzaprine (FEXMID) 7.5 MG tablet Take 1 tablet (7.5 mg total) by mouth 3 (three) times daily as needed for muscle spasms. 02/17/14  Yes Faythe Ghee, MD  folic acid (FOLVITE) 1 MG tablet Take 1 mg by mouth daily.    Yes Historical Provider, MD  methotrexate (RHEUMATREX) 2.5 MG tablet Take 20 mg by mouth once a week. 8 tablets on Friday. Caution:Chemotherapy. Protect from light.   Yes Historical Provider, MD  oxyCODONE-acetaminophen (PERCOCET/ROXICET) 5-325 MG per tablet Take 2 tablets by mouth every 4 (four) hours as needed for moderate pain or severe pain.   Yes Historical Provider, MD  predniSONE (DELTASONE) 5 MG tablet Take 5 mg by mouth daily.    Yes Historical Provider, MD   BP 136/76  Pulse 100  Temp(Src) 98.1 F (36.7 C) (Oral)  Resp 16  Ht 6\' 3"  (1.905 m)  Wt 260 lb (117.935 kg)  BMI 32.50 kg/m2  SpO2 100% Physical Exam  Nursing note and vitals reviewed. Constitutional: He appears well-developed and well-nourished. No distress.  HENT:  Head: Normocephalic and atraumatic.  Neck: Normal range of motion. Neck supple.  Cardiovascular: Normal rate and regular rhythm.   Pulmonary/Chest: Effort normal and breath sounds normal. No respiratory distress. He has no wheezes. He has no rales.  Abdominal: Soft. He exhibits no distension and no mass. There is no tenderness. There is no rebound and no guarding.  Musculoskeletal:       Arms: Neurological: He is alert. He exhibits normal muscle tone.  Skin: He is not diaphoretic.    ED Course  Procedures (including critical  care time) Labs Review Labs Reviewed  CBC WITH DIFFERENTIAL - Abnormal; Notable for the following:    WBC 17.9 (*)    Hemoglobin 12.8 (*)    HCT 38.2 (*)    Platelets 426 (*)    Neutrophils Relative % 80 (*)    Neutro Abs 14.5 (*)    Monocytes Absolute 1.2 (*)    All other components within normal limits  COMPREHENSIVE METABOLIC PANEL - Abnormal; Notable for the following:    Sodium 135 (*)    Chloride 95 (*)    Glucose, Bld 129 (*)    Albumin 3.2 (*)    ALT 69 (*)    All other components within normal limits  URINALYSIS, ROUTINE W REFLEX MICROSCOPIC - Abnormal; Notable for the following:    APPearance CLOUDY (*)     Nitrite POSITIVE (*)    Leukocytes, UA MODERATE (*)    All other components within normal limits  URINE MICROSCOPIC-ADD ON - Abnormal; Notable for the following:    Bacteria, UA MANY (*)    Casts HYALINE CASTS (*)    All other components within normal limits  URINE CULTURE  I-STAT CG4 LACTIC ACID, ED    Imaging Review Dg Chest 2 View  02/20/2014   CLINICAL DATA:  Postop from lumbar spine surgery.  EXAM: CHEST  2 VIEW  COMPARISON:  02/02/2014  FINDINGS: Mild linear opacity seen in the left lung base is unchanged in consistent with mild scarring. No evidence of pulmonary infiltrate or edema. No evidence of pleural effusion. Heart size and mediastinal contours are within normal limits.  IMPRESSION: Mild left basilar scarring.  No active disease.   Electronically Signed   By: Earle Gell M.D.   On: 02/20/2014 23:02   Ct Head Wo Contrast  02/20/2014   CLINICAL DATA:  Headache.  Fever.  Recent lumbar spine surgery.  EXAM: CT HEAD WITHOUT CONTRAST  TECHNIQUE: Contiguous axial images were obtained from the base of the skull through the vertex without intravenous contrast.  COMPARISON:  None.  FINDINGS: No evidence of intracranial hemorrhage, brain edema, or other signs of acute infarction. No evidence of intracranial mass lesion or mass effect. No abnormal extraaxial fluid collections identified. Ventricles are normal in size. No skull abnormality identified.  IMPRESSION: Negative noncontrast head CT.   Electronically Signed   By: Earle Gell M.D.   On: 02/20/2014 23:18     EKG Interpretation None      9:47 PM Discussed pt with Dr Tomi Bamberger.   12:04 AM I spoke with Dr Arnoldo Morale who requests medicine admission.  Dr Hal Neer will see pt in [facility] requests it of him in the morning.    MDM   Final diagnoses:  UTI (lower urinary tract infection)  Other headache syndrome  Non-intractable vomiting with nausea, vomiting of unspecified type   Pt POD 11 from lumbar surgery p/w fever, headache, N/V today.  No  meningeal signs. Found to have UTI.  Discussed with neurosurgery who requested medical admission for postop infection.   Pt feeling better with dilaudid, zofran, IVF.  Pt also seen by Dr Tomi Bamberger and discussed with Dr Tomi Bamberger.  Admitted to [facility]ist Dr Hal Hope.  Discussed result, findings, planwith patient.  Pt verbalizes understanding and agrees with plan.        [place], PA-C 02/21/14 [phone]

## 2014-02-20 NOTE — ED Notes (Signed)
PA at the bedside.

## 2014-02-20 NOTE — ED Notes (Signed)
Patient returned from Ct

## 2014-02-20 NOTE — ED Notes (Signed)
Patient still away at [place].

## 2014-02-21 ENCOUNTER — Encounter (HOSPITAL_COMMUNITY): Payer: Self-pay | Admitting: Internal Medicine

## 2014-02-21 DIAGNOSIS — G4489 Other headache syndrome: Secondary | ICD-10-CM | POA: Insufficient documentation

## 2014-02-21 DIAGNOSIS — R519 Headache, unspecified: Secondary | ICD-10-CM | POA: Diagnosis present

## 2014-02-21 DIAGNOSIS — R112 Nausea with vomiting, unspecified: Secondary | ICD-10-CM | POA: Diagnosis present

## 2014-02-21 DIAGNOSIS — M069 Rheumatoid arthritis, unspecified: Secondary | ICD-10-CM

## 2014-02-21 DIAGNOSIS — R51 Headache: Secondary | ICD-10-CM | POA: Diagnosis present

## 2014-02-21 DIAGNOSIS — N390 Urinary tract infection, site not specified: Secondary | ICD-10-CM | POA: Diagnosis present

## 2014-02-21 LAB — COMPREHENSIVE METABOLIC PANEL
ALT: 57 U/L — ABNORMAL HIGH (ref 0–53)
AST: 19 U/L (ref 0–37)
Albumin: 2.8 g/dL — ABNORMAL LOW (ref 3.5–5.2)
Alkaline Phosphatase: 104 U/L (ref 39–117)
Anion gap: 13 (ref 5–15)
BUN: 11 mg/dL (ref 6–23)
CHLORIDE: 95 meq/L — AB (ref 96–112)
CO2: 24 mEq/L (ref 19–32)
Calcium: 8.9 mg/dL (ref 8.4–10.5)
Creatinine, Ser: 0.64 mg/dL (ref 0.50–1.35)
GFR calc Af Amer: >90 mL/min (ref >90)
GFR calc non Af Amer: >90 mL/min (ref >90)
Glucose, Bld: 155 mg/dL — ABNORMAL HIGH (ref 70–99)
POTASSIUM: 4.6 meq/L (ref 3.7–5.3)
SODIUM: 132 meq/L — AB (ref 137–147)
TOTAL PROTEIN: 6.8 g/dL (ref 6.0–8.3)
Total Bilirubin: 0.3 mg/dL (ref 0.3–1.2)

## 2014-02-21 LAB — CBC
HEMATOCRIT: 34.4 % — AB (ref 39.0–52.0)
HEMOGLOBIN: 11.4 g/dL — AB (ref 13.0–17.0)
MCH: 29.1 pg (ref 26.0–34.0)
MCHC: 33.1 g/dL (ref 30.0–36.0)
MCV: 87.8 fL (ref 78.0–100.0)
Platelets: 403 10*3/uL — ABNORMAL HIGH (ref 150–400)
RBC: 3.92 MIL/uL — ABNORMAL LOW (ref 4.22–5.81)
RDW: 14.1 % (ref 11.5–15.5)
WBC: 19.9 10*3/uL — ABNORMAL HIGH (ref 4.0–10.5)

## 2014-02-21 LAB — C-REACTIVE PROTEIN: CRP: 3.2 mg/dL — ABNORMAL HIGH (ref <0.60)

## 2014-02-21 LAB — LACTIC ACID, PLASMA: Lactic Acid, Venous: 1.2 mmol/L (ref 0.5–2.2)

## 2014-02-21 LAB — SEDIMENTATION RATE: Sed Rate: 46 mm/hr — ABNORMAL HIGH (ref 0–16)

## 2014-02-21 MED ORDER — ONDANSETRON HCL 4 MG PO TABS: 4.0000 mg | ORAL_TABLET | Freq: Four times a day (QID) | ORAL | Status: DC | PRN

## 2014-02-21 MED ORDER — ACETAMINOPHEN 325 MG PO TABS
650.0000 mg | ORAL_TABLET | Freq: Four times a day (QID) | ORAL | Status: DC | PRN
  Administered 2014-02-21 – 2014-02-23 (×4): 650 mg via ORAL
  Filled 2014-02-21 (×4): qty 2

## 2014-02-21 MED ORDER — VANCOMYCIN HCL 10 G IV SOLR
2500.0000 mg | Freq: Once | INTRAVENOUS | Status: AC
  Administered 2014-02-21: 2500 mg via INTRAVENOUS
  Filled 2014-02-21: qty 2500

## 2014-02-21 MED ORDER — CYCLOBENZAPRINE HCL 5 MG PO TABS
7.5000 mg | ORAL_TABLET | Freq: Three times a day (TID) | ORAL | Status: DC | PRN
  Administered 2014-02-22 – 2014-03-02 (×4): 7.5 mg via ORAL
  Filled 2014-02-21 (×9): qty 1.5

## 2014-02-21 MED ORDER — HYDROCORTISONE NA SUCCINATE PF 100 MG IJ SOLR
50.0000 mg | Freq: Three times a day (TID) | INTRAMUSCULAR | Status: DC
  Administered 2014-02-21: 50 mg via INTRAVENOUS
  Filled 2014-02-21: qty 2

## 2014-02-21 MED ORDER — VANCOMYCIN HCL IN DEXTROSE 1-5 GM/200ML-% IV SOLN
1000.0000 mg | Freq: Three times a day (TID) | INTRAVENOUS | Status: DC
  Administered 2014-02-21 – 2014-02-24 (×11): 1000 mg via INTRAVENOUS
  Filled 2014-02-21 (×14): qty 200

## 2014-02-21 MED ORDER — ONDANSETRON HCL 4 MG/2ML IJ SOLN: 4.0000 mg | Freq: Three times a day (TID) | INTRAMUSCULAR | Status: DC | PRN

## 2014-02-21 MED ORDER — HYDROMORPHONE HCL PF 1 MG/ML IJ SOLN
1.0000 mg | INTRAMUSCULAR | Status: DC | PRN
  Administered 2014-02-21: 1 mg via INTRAVENOUS
  Filled 2014-02-21: qty 1

## 2014-02-21 MED ORDER — SODIUM CHLORIDE 0.9 % IV SOLN
INTRAVENOUS | Status: DC
  Administered 2014-02-21: 1000 mL via INTRAVENOUS

## 2014-02-21 MED ORDER — ACETAMINOPHEN 650 MG RE SUPP: 650.0000 mg | Freq: Four times a day (QID) | RECTAL | Status: DC | PRN

## 2014-02-21 MED ORDER — ONDANSETRON HCL 4 MG/2ML IJ SOLN
4.0000 mg | Freq: Four times a day (QID) | INTRAMUSCULAR | Status: DC | PRN
  Administered 2014-02-21: 4 mg via INTRAVENOUS
  Filled 2014-02-21: qty 2

## 2014-02-21 MED ORDER — DEXTROSE 5 % IV SOLN
1.0000 g | Freq: Two times a day (BID) | INTRAVENOUS | Status: DC
  Administered 2014-02-21 – 2014-02-26 (×12): 1 g via INTRAVENOUS
  Filled 2014-02-21 (×15): qty 10

## 2014-02-21 MED ORDER — SODIUM CHLORIDE 0.9 % IV SOLN
INTRAVENOUS | Status: DC
  Administered 2014-02-23 – 2014-02-26 (×5): via INTRAVENOUS

## 2014-02-21 MED ORDER — FOLIC ACID 1 MG PO TABS
1.0000 mg | ORAL_TABLET | Freq: Every day | ORAL | Status: DC
  Administered 2014-02-21 – 2014-03-11 (×19): 1 mg via ORAL
  Filled 2014-02-21 (×19): qty 1

## 2014-02-21 MED ORDER — HYDROMORPHONE HCL PF 1 MG/ML IJ SOLN
1.0000 mg | INTRAMUSCULAR | Status: DC | PRN
  Administered 2014-02-21 – 2014-02-24 (×22): 1 mg via INTRAVENOUS
  Filled 2014-02-21 (×21): qty 1

## 2014-02-21 MED ORDER — DEXAMETHASONE 4 MG PO TABS
4.0000 mg | ORAL_TABLET | Freq: Four times a day (QID) | ORAL | Status: AC
  Administered 2014-02-21 – 2014-02-22 (×4): 4 mg via ORAL
  Filled 2014-02-21 (×3): qty 1

## 2014-02-21 MED ORDER — HYDROCORTISONE NA SUCCINATE PF 100 MG IJ SOLR
25.0000 mg | Freq: Three times a day (TID) | INTRAMUSCULAR | Status: DC
  Administered 2014-02-21: 25 mg via INTRAVENOUS
  Filled 2014-02-21: qty 2

## 2014-02-21 MED ORDER — HEPARIN SODIUM (PORCINE) 5000 UNIT/ML IJ SOLN
5000.0000 [IU] | Freq: Three times a day (TID) | INTRAMUSCULAR | Status: DC
  Administered 2014-02-21 – 2014-03-05 (×35): 5000 [IU] via SUBCUTANEOUS
  Filled 2014-02-21 (×34): qty 1

## 2014-02-21 NOTE — Progress Notes (Signed)
Patient ID: Willie Howe, male   DOB: 10/12/1960, 53 y.o.   MRN: [id number] Patient well known to me. Had PLIF almost 2 weeks ago. A few days after surgery he developed bad headache that was not positional. He underwent MRI scanning that showed a fluid collection. But it was felt more likely to be a seroma.   He was put on steroids, and he got excellent improvement. He was discharged 5 days ago doing well, and did well for a few days at home. Over last few days, he developed recurrent headache, and came back to ER last night. Noted to have UTI, and admitted by hospitalist service.  Atr this time, there is a tiny amount of serous drainage from the wound, but no redness or swelling. He is awake, alert, and not in much distress.  i am not sure if he has a CSF leak post op or not. There was not a dural tear noted at his surgery. I think that we should proceed with a myelogram and post myelogram CT to seee if we can rule in or out the CSF leak issues. Also, if the CSF space can be entered by radiology, they can send fluid for cells, gram stain, and culture to rule out inferction. Patient and his wife understand and request that we proceed. Will order myelo for tomorrow.

## 2014-02-21 NOTE — Progress Notes (Addendum)
Pt seen and examined, admitted this am per Dr.Kakrakandy 52/M s/p Laminectomy/discectomy/fusion of L spine 6/26 per Dr.Kritzer and subsequently discharged on 7/3 Admitted last pm with  1. Headache/N/V/fever (102) -cannot rule out post op infection/meningitis -improving on empiric Abx (Vanc/Ceftriaxone), afebrile since admission -Wound site appears unremarkable  -Ct head benign -LP will not be easy given Lumbar surgery -d/w Dr.Kritzer for consult -FU blood Cx  2. Possible UTI -no symptoms of this though -had a catheter last admission, which may be responsible -Continue Ceftriaxone, FU Urine Cx  3. H/o RA: -MTX on hold, On stress dose steroids due to chronic prednisone -will cut down dose   Willie Polite, MD [phone]

## 2014-02-21 NOTE — Progress Notes (Signed)
CARE MANAGEMENT NOTE 02/21/2014  Patient:  DWON, SKY   Account Number:  [id number]  Date Initiated:  02/21/2014  Documentation initiated by:  Olga Coaster  Subjective/Objective Assessment:   ADMITTED WITH HEADACHE, N/V; RECENT BACK SURGERY     Action/Plan:   CM FOLLOWING FOR DCP   Anticipated DC Date:  02/24/2014   Anticipated DC Plan:  [place]  CM consult         Status of service:  In process, will continue to follow Medicare Important Message given?   (If response is "NO", the following Medicare IM given date fields will be blank)  Per UR Regulation:  Reviewed for med. necessity/level of care/duration of stay  Comments:  7/7/2015Mindi Slicker RN,BSN,MHA [phone]

## 2014-02-21 NOTE — ED Notes (Signed)
Admitting physician at bedside

## 2014-02-21 NOTE — Progress Notes (Signed)
ANTIBIOTIC CONSULT NOTE - INITIAL  Pharmacy Consult for Vancocin and Rocephin Indication: fever and UTI  No Known Allergies  Patient Measurements: Height: 6\' 3"  (190.5 cm) Weight: 257 lb 11.5 oz (116.9 kg) IBW/kg (Calculated) : 84.5  Vital Signs: Temp: 98.7 F (37.1 C) (07/07 0215) Temp src: Oral (07/07 0215) BP: 149/82 mmHg (07/07 0215) Pulse Rate: 92 (07/07 0215)  Labs:  Recent Labs  02/20/14 2054  WBC 17.9*  HGB 12.8*  PLT 426*  CREATININE 0.73   Estimated Creatinine Clearance: 145.6 ml/min (by C-G formula based on Cr of 0.73).   Microbiology: Recent Results (from the past 720 hour(s))  SURGICAL PCR SCREEN     Status: None   Collection Time    02/02/14 10:27 AM      Result Value Ref Range Status   MRSA, PCR NEGATIVE  NEGATIVE Final   Staphylococcus aureus NEGATIVE  NEGATIVE Final   Comment:            The Xpert SA Assay (FDA     approved for NASAL specimens     in patients over 62 years of age),     is one component of     a comprehensive surveillance     program.  Test performance has     been validated by [employer] for patients greater     than or equal to 68 year old.     It is not intended     to diagnose infection nor to     guide or monitor treatment.    Medical History: Past Medical History  Diagnosis Date  . Arthritis   . Thyroid disease     Graves Disease  . Previous back surgery     x 3  . Kidney stone   . Dysrhythmia   . Anginal pain     Medications:  Prescriptions prior to admission  Medication Sig Dispense Refill  . cyclobenzaprine (FEXMID) 7.5 MG tablet Take 1 tablet (7.5 mg total) by mouth 3 (three) times daily as needed for muscle spasms.  30 tablet  0  . folic acid (FOLVITE) 1 MG tablet Take 1 mg by mouth daily.      . methotrexate (RHEUMATREX) 2.5 MG tablet Take 20 mg by mouth once a week. 8 tablets on Friday. Caution:Chemotherapy. Protect from light.      Marland Kitchen oxyCODONE-acetaminophen (PERCOCET/ROXICET) 5-325 MG per  tablet Take 2 tablets by mouth every 4 (four) hours as needed for moderate pain or severe pain.      . predniSONE (DELTASONE) 5 MG tablet Take 5 mg by mouth daily.        Scheduled:  . folic acid  1 mg Oral Daily  . heparin  5,000 Units Subcutaneous 3 times per day  . hydrocortisone sod succinate (SOLU-CORTEF) inj  50 mg Intravenous Q8H   Infusions:  . sodium chloride      Assessment: 53yo male had recently been admitted for lumbar spinal stenosis, had lumbar surgery 6/26, now c/o fever of 102 and increased in dizziness and headache w/ N/V similar to post-op HA, took first shower yesterday and didn't use Saran Wrap, no signs of meningitis, found to have UTI, also concern for further infection given hx and fever, to begin IV ABX.  Goal of Therapy:  Vancomycin trough level 15-20 mcg/ml  Plan:  Rec'd Rocephin 1g IV in ED; will continue with Rocephin 1g IV Q12H as well as vancomycin 2500mg  IV x1 followed by 1000mg   IV Q8H and monitor CBC, Cx, levels prn.  Wynona Neat, PharmD, BCPS  02/21/2014,3:02 AM

## 2014-02-21 NOTE — ED Provider Notes (Signed)
Pt had recent spinal fusion surgery.  Discharged July 3rd.  Last night developed a fever to 102.  No cough. No dysuria.  No increased back pain.  He does have a headache as well as nausea and vomiting. Physical Exam  BP 123/60  Pulse 86  Temp(Src) 98.8 F (37.1 C) (Oral)  Resp 18  Ht 6\' 3"  (1.905 m)  Wt 257 lb 11.5 oz (116.9 kg)  BMI 32.21 kg/m2  SpO2 99%  Physical Exam  Nursing note and vitals reviewed. Constitutional: He appears well-developed and well-nourished. No distress.  HENT:  Head: Normocephalic and atraumatic.  Right Ear: External ear normal.  Left Ear: External ear normal.  Eyes: Conjunctivae are normal. Right eye exhibits no discharge. Left eye exhibits no discharge. No scleral icterus.  Neck: Normal range of motion. Neck supple. No tracheal deviation present.  No meningeal signs  Cardiovascular: Normal rate.   Pulmonary/Chest: Effort normal. No stridor. No respiratory distress.  Musculoskeletal: He exhibits no edema.  Surgical wound lumbar spine, no dehiscence, no drainage, no erythema, no tenderness  Neurological: He is alert. Cranial nerve deficit: no gross deficits.  Skin: Skin is warm and dry. No rash noted.  Psychiatric: He has a normal mood and affect.    ED Course  Procedures  MDM Medical screening examination/treatment/procedure(s) were conducted as a shared visit with non-physician practitioner(s) and myself.  I personally evaluated the patient during the encounter.   Labs revealed a uti.  Suspect that is the source of his fever.  Plan on admission, IV abx.  Neurosurgery consulted.    Dorie Rank, MD 02/21/14 [phone]

## 2014-02-21 NOTE — H&P (Signed)
[facility] History and Physical  Willie Howe OVF:[id number] DOB: 25-Jul-1961 DOA: 02/20/2014  Referring physician: ER physician. PCP: PROVIDER NOT IN SYSTEM  Chief Complaint: Headache nausea vomiting and fever.  HPI: Willie Howe is a 53 y.o. male with history of rheumatoid arthritis who has recently undergone low back surgery 10 days ago presents to the ER with complaints of severe headache and nausea vomiting with fever. Patient had surgery on February 10, 2014. Two days following the surgery patient had developed headache and neurosurgeon have placed patient on prednisone which improved patient's headache. Patient also had an MRI of the lumbar spine to check for any CSF leak and was found to have none. Following which patient's headache had improved and patient was discharged on July 3. Patient states he did well on July 4 and fifth and on July 6, that is yesterday, since morning patient has been having intractable frontal headaches with nausea vomiting. Patient has mild low back pain which is not increased in intensity. There is mild serosanguineous discharge from the wound area. Patient is able to move his lower extremities without any difficulties. Denies any chest pain cough productive sputum dysuria or discharges diarrhea. Denies any abdominal pain. Patient has been having persistent intractable nausea and vomiting with headache and photophobia. Patient had recorded a fever of 102F at home. He came to the ER. In the ER is afebrile but has leukocytosis and UA shows features concerning for UTI. At this time patient on exam is nonfocal. He complains of some neck pain but there is no obvious neck rigidity. On-call neurosurgeon was consulted and at this time patient will be admitted for further management.  Review of Systems: As presented in the history of presenting illness, rest negative.  Past Medical History  Diagnosis Date  . Arthritis   . Thyroid disease     Graves Disease  . Previous  back surgery     x 3  . Kidney stone   . Dysrhythmia   . Anginal pain    Past Surgical History  Procedure Laterality Date  . Knee surgery      x 2  . Nephrolithotomy    . Back surgery    . Colonoscopy     Social History:  reports that he quit smoking about 19 years ago. His smoking use included Cigarettes. He has a 30 pack-year smoking history. He has never used smokeless tobacco. He reports that he does not drink alcohol or use illicit drugs. Where does patient live home. Can patient participate in ADLs? Yes.  No Known Allergies  Family History:  Family History  Problem Relation Age of Onset  . CAD Sister       Prior to Admission medications   Medication Sig Start Date End Date Taking? Authorizing Provider  cyclobenzaprine (FEXMID) 7.5 MG tablet Take 1 tablet (7.5 mg total) by mouth 3 (three) times daily as needed for muscle spasms. 02/17/14  Yes Faythe Ghee, MD  folic acid (FOLVITE) 1 MG tablet Take 1 mg by mouth daily.   Yes Historical Provider, MD  methotrexate (RHEUMATREX) 2.5 MG tablet Take 20 mg by mouth once a week. 8 tablets on Friday. Caution:Chemotherapy. Protect from light.   Yes Historical Provider, MD  oxyCODONE-acetaminophen (PERCOCET/ROXICET) 5-325 MG per tablet Take 2 tablets by mouth every 4 (four) hours as needed for moderate pain or severe pain.   Yes Historical Provider, MD  predniSONE (DELTASONE) 5 MG tablet Take 5 mg by mouth daily.  Yes Historical Provider, MD    Physical Exam: Filed Vitals:   02/21/14 0030 02/21/14 0100 02/21/14 0130 02/21/14 0215  BP: 122/63 132/74 116/61 149/82  Pulse: 98 99 98 92  Temp:    98.7 F (37.1 C)  TempSrc:    Oral  Resp: 22 20 20 20   Height:    6\' 3"  (1.905 m)  Weight:    116.9 kg (257 lb 11.5 oz)  SpO2: 94% 96% 96% 100%     General:  Well-developed and nourished.  Eyes: Anicteric no pallor.  ENT: No discharge from the ears eyes nose or mouth.  Neck: No mass felt. No neck rigidity.  Cardiovascular:  S1-S2 heard.  Respiratory: No rhonchi or crepitations.  Abdomen: Soft nontender bowel sounds present. No guarding or rigidity.  Skin: No rash. Mild serosanguineous discharge from the surgical site at his lumbar area.  Musculoskeletal: No obvious effusions or joint swellings or edema.  Psychiatric: Appears normal.  Neurologic: Alert awake oriented to time place and person. Moves all extremities 5 x 5.  Labs on Admission:  Basic Metabolic Panel:  Recent Labs Lab 02/20/14 2054  NA 135*  K 4.5  CL 95*  CO2 26  GLUCOSE 129*  BUN 11  CREATININE 0.73  CALCIUM 9.3   Liver Function Tests:  Recent Labs Lab 02/20/14 2054  AST 23  ALT 69*  ALKPHOS 116  BILITOT 0.4  PROT 7.6  ALBUMIN 3.2*   No results found for this basename: LIPASE, AMYLASE,  in the last 168 hours No results found for this basename: AMMONIA,  in the last 168 hours CBC:  Recent Labs Lab 02/20/14 2054  WBC 17.9*  NEUTROABS 14.5*  HGB 12.8*  HCT 38.2*  MCV 87.6  PLT 426*   Cardiac Enzymes: No results found for this basename: CKTOTAL, CKMB, CKMBINDEX, TROPONINI,  in the last 168 hours  BNP (last 3 results) No results found for this basename: PROBNP,  in the last 8760 hours CBG: No results found for this basename: GLUCAP,  in the last 168 hours  Radiological Exams on Admission: Dg Chest 2 View  02/20/2014   CLINICAL DATA:  Postop from lumbar spine surgery.  EXAM: CHEST  2 VIEW  COMPARISON:  02/02/2014  FINDINGS: Mild linear opacity seen in the left lung base is unchanged in consistent with mild scarring. No evidence of pulmonary infiltrate or edema. No evidence of pleural effusion. Heart size and mediastinal contours are within normal limits.  IMPRESSION: Mild left basilar scarring.  No active disease.   Electronically Signed   By: Earle Gell M.D.   On: 02/20/2014 23:02   Ct Head Wo Contrast  02/20/2014   CLINICAL DATA:  Headache.  Fever.  Recent lumbar spine surgery.  EXAM: CT HEAD WITHOUT CONTRAST   TECHNIQUE: Contiguous axial images were obtained from the base of the skull through the vertex without intravenous contrast.  COMPARISON:  None.  FINDINGS: No evidence of intracranial hemorrhage, brain edema, or other signs of acute infarction. No evidence of intracranial mass lesion or mass effect. No abnormal extraaxial fluid collections identified. Ventricles are normal in size. No skull abnormality identified.  IMPRESSION: Negative noncontrast head CT.   Electronically Signed   By: Earle Gell M.D.   On: 02/20/2014 23:18    Assessment/Plan Principal Problem:   Headache(784.0) Active Problems:   UTI (lower urinary tract infection)   Nausea with vomiting   Rheumatoid arthritis   Nausea & vomiting   1. Headache with nausea vomiting  and fever and recent lumbar surgery - patient does have a UA consistent with UTI. At this time blood cultures and urine cultures have been ordered along with sedimentation rate and CRP lactic acid. Patient will be placed on empiric antibiotics including vancomycin and ceftriaxone. Follow cultures. Discuss with patient's neurosurgeon in a.m. 2. History of rheumatoid arthritis - hold methotrexate for now. Patient will be placed on stress dose steroids as patient has been on long time prednisone. 3. History of Graves' disease - patient states that his Graves' disease is in remission spontaneously. Check TSH levels.    Code Status: Full code.  Family Communication: Patient's wife at the bedside.  Disposition Plan: Admit to inpatient.    KAKRAKANDY,ARSHAD N. [facility] Pager [phone].  If 7PM-7AM, please contact night-coverage [email or web address] Password [email or web address] 02/21/2014, 2:59 AM

## 2014-02-22 ENCOUNTER — Inpatient Hospital Stay (HOSPITAL_COMMUNITY): Payer: BC Managed Care – PPO

## 2014-02-22 ENCOUNTER — Encounter (HOSPITAL_COMMUNITY): Payer: Self-pay | Admitting: *Deleted

## 2014-02-22 DIAGNOSIS — G988 Other disorders of nervous system: Secondary | ICD-10-CM

## 2014-02-22 LAB — URINE CULTURE

## 2014-02-22 MED ORDER — IOHEXOL 180 MG/ML  SOLN
20.0000 mL | Freq: Once | INTRAMUSCULAR | Status: AC | PRN
  Administered 2014-02-22: 15 mL via INTRATHECAL

## 2014-02-22 MED ORDER — HYDROMORPHONE HCL PF 1 MG/ML IJ SOLN
INTRAMUSCULAR | Status: AC
  Administered 2014-02-22: 1 mg via INTRAVENOUS
  Filled 2014-02-22: qty 1

## 2014-02-22 NOTE — Progress Notes (Signed)
Patient ID: Willie Howe, male   DOB: 03-10-1961, 53 y.o.   MRN: [id number] Afeb, vss No new neuro issues. The myelo?CT does show some egress of dye c/w dural leak. There was no tear noted at surgery, so not sure what this is about. i think the best answer is to explore the wound and do what needs to be done to repair the problem. Taklked to patient and wife, and will proceed in that direction tomorrow.  Risks/benefits discussed. They request that we proceed!

## 2014-02-22 NOTE — Progress Notes (Signed)
[facility] PROGRESS NOTE  Willie Howe [id number]:[id number] DOB: 05/03/1961 DOA: 02/20/2014 PCP: PROVIDER NOT IN SYSTEM  Assessment/Plan:  1. CSF Leak, L3-4 on left -per myelogram, Likely etiology of the headaches -Dr Hal Neer to follow for further recs  2.Headache/N/V/fever (102)  -leak above likely etiology of headaches, cannot rule out post op infection/meningitis  -Wound site appears unremarkable  -Ct head benign  -LP will not be easy given Lumbar surgery  - on empiric Abx (Vanc/Ceftriaxone), afebrile since admission  -WBC still trending up, blood cultures still pending, follow  3. E. Coli UTI  -had a catheter last admission, which may be responsible  -Continue Ceftriaxone  4.. H/o RA:  -MTX on hold, On stress dose steroids due to chronic prednisone  -will cut down dose   Code Status: full Family Communication: mother and sisters ay bedside Disposition Plan: to home when medically ready   Consultants:  NS  Procedures:  none  Antibiotics:  Vanc & Rocephin started on 7/7   HPI/Subjective: Still with headaches and back pain. States he had good amt of drainage from back earlier today  Objective: Filed Vitals:   02/22/14 1428  BP: 135/78  Pulse: 89  Temp: 98 F (36.7 C)  Resp: 20    Intake/Output Summary (Last 24 hours) at 02/22/14 1543 Last data filed at 02/22/14 1100  Gross per 24 hour  Intake 1422.5 ml  Output   1000 ml  Net  422.5 ml   Filed Weights   02/20/14 2022 02/21/14 0215  Weight: 117.935 kg (260 lb) 116.9 kg (257 lb 11.5 oz)    Exam:  General: alert & oriented x 3 In NAD Cardiovascular: RRR, nl S1 s2 Respiratory: CTAB Abdomen: soft +BS NT/ND, no masses palpable Back: lumbarspine area with band aids, no drainage Extremities: No cyanosis and no edema    Data Reviewed: Basic Metabolic Panel:  Recent Labs Lab 02/20/14 2054 02/21/14 0425  NA 135* 132*  K 4.5 4.6  CL 95* 95*  CO2 26 24  GLUCOSE 129* 155*  BUN 11 11   CREATININE 0.73 0.64  CALCIUM 9.3 8.9   Liver Function Tests:  Recent Labs Lab 02/20/14 2054 02/21/14 0425  AST 23 19  ALT 69* 57*  ALKPHOS 116 104  BILITOT 0.4 0.3  PROT 7.6 6.8  ALBUMIN 3.2* 2.8*   No results found for this basename: LIPASE, AMYLASE,  in the last 168 hours No results found for this basename: AMMONIA,  in the last 168 hours CBC:  Recent Labs Lab 02/20/14 2054 02/21/14 0425  WBC 17.9* 19.9*  NEUTROABS 14.5*  --   HGB 12.8* 11.4*  HCT 38.2* 34.4*  MCV 87.6 87.8  PLT 426* 403*   Cardiac Enzymes: No results found for this basename: CKTOTAL, CKMB, CKMBINDEX, TROPONINI,  in the last 168 hours BNP (last 3 results) No results found for this basename: PROBNP,  in the last 8760 hours CBG: No results found for this basename: GLUCAP,  in the last 168 hours  Recent Results (from the past 240 hour(s))  URINE CULTURE     Status: None   Collection Time    02/20/14 11:07 PM      Result Value Ref Range Status   Specimen Description URINE, CLEAN CATCH   Final   Special Requests ADDED 0040 02/21/14   Final   Culture  Setup Time     Final   Value: 02/21/2014 03:58     Performed at [place]  Final   Value: >=100,000 COLONIES/ML     Performed at [facility]   Culture     Final   Value: ESCHERICHIA COLI     Performed at [facility]   Report Status PENDING   Incomplete  CULTURE, BLOOD (ROUTINE X 2)     Status: None   Collection Time    02/21/14  1:37 AM      Result Value Ref Range Status   Specimen Description BLOOD LEFT HAND   Final   Special Requests     Final   Value: BOTTLES DRAWN AEROBIC AND ANAEROBIC ANAEROBIC 6CC AEROBIC 12CC   Culture  Setup Time     Final   Value: 02/21/2014 08:34     Performed at [facility]   Culture     Final   Value:        BLOOD CULTURE RECEIVED NO GROWTH TO DATE CULTURE WILL BE HELD FOR 5 DAYS BEFORE ISSUING A FINAL NEGATIVE REPORT     Performed at [facility]    Report Status PENDING   Incomplete  CULTURE, BLOOD (ROUTINE X 2)     Status: None   Collection Time    02/21/14  1:37 AM      Result Value Ref Range Status   Specimen Description BLOOD RIGHT FOREARM   Final   Special Requests BOTTLES DRAWN AEROBIC AND ANAEROBIC 6CC EA   Final   Culture  Setup Time     Final   Value: 02/21/2014 08:34     Performed at [facility]   Culture     Final   Value:        BLOOD CULTURE RECEIVED NO GROWTH TO DATE CULTURE WILL BE HELD FOR 5 DAYS BEFORE ISSUING A FINAL NEGATIVE REPORT     Performed at [facility]   Report Status PENDING   Incomplete     Studies: Dg Chest 2 View  02/20/2014   CLINICAL DATA:  Postop from lumbar spine surgery.  EXAM: CHEST  2 VIEW  COMPARISON:  02/02/2014  FINDINGS: Mild linear opacity seen in the left lung base is unchanged in consistent with mild scarring. No evidence of pulmonary infiltrate or edema. No evidence of pleural effusion. Heart size and mediastinal contours are within normal limits.  IMPRESSION: Mild left basilar scarring.  No active disease.   Electronically Signed   By: Earle Gell M.D.   On: 02/20/2014 23:02   Ct Head Wo Contrast  02/20/2014   CLINICAL DATA:  Headache.  Fever.  Recent lumbar spine surgery.  EXAM: CT HEAD WITHOUT CONTRAST  TECHNIQUE: Contiguous axial images were obtained from the base of the skull through the vertex without intravenous contrast.  COMPARISON:  None.  FINDINGS: No evidence of intracranial hemorrhage, brain edema, or other signs of acute infarction. No evidence of intracranial mass lesion or mass effect. No abnormal extraaxial fluid collections identified. Ventricles are normal in size. No skull abnormality identified.  IMPRESSION: Negative noncontrast head CT.   Electronically Signed   By: Earle Gell M.D.   On: 02/20/2014 23:18   Ct Lumbar Spine W Contrast  02/22/2014   CLINICAL DATA:  Extension of lumbar fusion on 02/10/2014. Question CSF leak based on previous MR.  EXAM:  LUMBAR MYELOGRAM  FLUOROSCOPY TIME:  3 min 42 seconds  PROCEDURE: After thorough discussion of risks and benefits of the procedure including bleeding, infection, injury to nerves, blood vessels, adjacent structures as well as headache  and CSF leak, written and oral informed consent was obtained. Consent was obtained by Dr. Nelson Chimes. Time out form was completed.  In evaluating the previous MRI, it is evident that there is a large amount of subdural fluid. This complicates the choices for needle approach. The conus tip is at L1. At the L1-2 level, the subdural fluid occupies most of the spinal canal and I think it would be unlikely to be successful. Because of the artifact, I could not tell if the subdural fluid when all the way to the tip of the thecal sac and therefore decided to use that approach initially.  Patient was positioned prone on the fluoroscopy table. Local anesthesia was provided with 1% lidocaine without epinephrine after prepped and draped in the usual sterile fashion. Puncture was performed at S1-2 level initially using a 5 inch 22-gauge spinal needle via a left-sided approach. I could not convincingly gad CSF fluid return and therefore approach at the L5-S1 level. 15 mL of Omnipaque-180 was injected. It is evident that the fluid, even from this approach comment is subdural. Nonetheless, this should be sufficient to define the site of leak.  I personally performed the lumbar puncture and administered the intrathecal contrast. I also personally performed acquisition of the myelogram images.  TECHNIQUE: Contiguous axial images were obtained through the Lumbar spine after the intrathecal infusion of infusion. Coronal and sagittal reconstructions were obtained of the axial image sets.  COMPARISON:  MRI 02/15/2014.  Multiple older studies.  FINDINGS: LUMBAR MYELOGRAM FINDINGS:  Myelogram images do in fact show subdural contrast extending throughout the region. There is no visible epidural contrast and  therefore the findings should be valid.  CT LUMBAR MYELOGRAM FINDINGS:  As described above, note that the contrast was injected at the L5-S1 level and communicated with the subdural space.  Contrast fills a large subdural collection that extends throughout the region, compressing the arachnoid space into a flat configuration in the center of the spinal canal. Contrast injection appears entirely subdural. Contrast appears to flow from the subdural space into the posterior soft tissue fluid collection from a defect on the left at the L3-4 disc level.  Newly placed hardware appears well positioned. Interbody material at L3-4 appears well positioned.  IMPRESSION: CSF leak demonstrated on the left at the L3-4 disc level. Contrast injected at the L5-S1 level, filling the large subdural space fluid collections, extends into the posterior fluid collection. See above for full discussion.   Electronically Signed   By: Nelson Chimes M.D.   On: 02/22/2014 14:28   Dg Myelography Lumbar Inj Lumbosacral  02/22/2014   CLINICAL DATA:  Extension of lumbar fusion on 02/10/2014. Question CSF leak based on previous MR.  EXAM: LUMBAR MYELOGRAM  FLUOROSCOPY TIME:  3 min 42 seconds  PROCEDURE: After thorough discussion of risks and benefits of the procedure including bleeding, infection, injury to nerves, blood vessels, adjacent structures as well as headache and CSF leak, written and oral informed consent was obtained. Consent was obtained by Dr. Nelson Chimes. Time out form was completed.  In evaluating the previous MRI, it is evident that there is a large amount of subdural fluid. This complicates the choices for needle approach. The conus tip is at L1. At the L1-2 level, the subdural fluid occupies most of the spinal canal and I think it would be unlikely to be successful. Because of the artifact, I could not tell if the subdural fluid when all the way to the tip of the thecal  sac and therefore decided to use that approach initially.   Patient was positioned prone on the fluoroscopy table. Local anesthesia was provided with 1% lidocaine without epinephrine after prepped and draped in the usual sterile fashion. Puncture was performed at S1-2 level initially using a 5 inch 22-gauge spinal needle via a left-sided approach. I could not convincingly gad CSF fluid return and therefore approach at the L5-S1 level. 15 mL of Omnipaque-180 was injected. It is evident that the fluid, even from this approach comment is subdural. Nonetheless, this should be sufficient to define the site of leak.  I personally performed the lumbar puncture and administered the intrathecal contrast. I also personally performed acquisition of the myelogram images.  TECHNIQUE: Contiguous axial images were obtained through the Lumbar spine after the intrathecal infusion of infusion. Coronal and sagittal reconstructions were obtained of the axial image sets.  COMPARISON:  MRI 02/15/2014.  Multiple older studies.  FINDINGS: LUMBAR MYELOGRAM FINDINGS:  Myelogram images do in fact show subdural contrast extending throughout the region. There is no visible epidural contrast and therefore the findings should be valid.  CT LUMBAR MYELOGRAM FINDINGS:  As described above, note that the contrast was injected at the L5-S1 level and communicated with the subdural space.  Contrast fills a large subdural collection that extends throughout the region, compressing the arachnoid space into a flat configuration in the center of the spinal canal. Contrast injection appears entirely subdural. Contrast appears to flow from the subdural space into the posterior soft tissue fluid collection from a defect on the left at the L3-4 disc level.  Newly placed hardware appears well positioned. Interbody material at L3-4 appears well positioned.  IMPRESSION: CSF leak demonstrated on the left at the L3-4 disc level. Contrast injected at the L5-S1 level, filling the large subdural space fluid collections, extends  into the posterior fluid collection. See above for full discussion.   Electronically Signed   By: Nelson Chimes M.D.   On: 02/22/2014 14:28    Scheduled Meds: . cefTRIAXone (ROCEPHIN)  IV  1 g Intravenous Q12H  . folic acid  1 mg Oral Daily  . heparin  5,000 Units Subcutaneous 3 times per day  . vancomycin  1,000 mg Intravenous Q8H   Continuous Infusions: . sodium chloride 75 mL/hr at 02/21/14 1038    Principal Problem:   Headache(784.0) Active Problems:   UTI (lower urinary tract infection)   Nausea with vomiting   Rheumatoid arthritis   Nausea & vomiting    Time spent: [facility] Pager [phone]. If 7PM-7AM, please contact night-coverage at [email or web address], password [facility] 02/22/2014, 3:43 PM  LOS: 2 days

## 2014-02-23 ENCOUNTER — Inpatient Hospital Stay (HOSPITAL_COMMUNITY): Payer: BC Managed Care – PPO | Admitting: Anesthesiology

## 2014-02-23 ENCOUNTER — Encounter (HOSPITAL_COMMUNITY): Payer: Self-pay | Admitting: Anesthesiology

## 2014-02-23 ENCOUNTER — Encounter (HOSPITAL_COMMUNITY): Payer: BC Managed Care – PPO | Admitting: Anesthesiology

## 2014-02-23 ENCOUNTER — Encounter (HOSPITAL_COMMUNITY)
Admission: EM | Disposition: A | Discharge status: Home / Self Care | Payer: Self-pay | Source: Home / Self Care | Attending: Neurosurgery

## 2014-02-23 HISTORY — PX: LUMBAR WOUND DEBRIDEMENT: SHX1988

## 2014-02-23 LAB — CBC
HCT: 32.5 % — ABNORMAL LOW (ref 39.0–52.0)
Hemoglobin: 10.7 g/dL — ABNORMAL LOW (ref 13.0–17.0)
MCH: 28.5 pg (ref 26.0–34.0)
MCHC: 32.9 g/dL (ref 30.0–36.0)
MCV: 86.4 fL (ref 78.0–100.0)
PLATELETS: 445 10*3/uL — AB (ref 150–400)
RBC: 3.76 MIL/uL — AB (ref 4.22–5.81)
RDW: 14 % (ref 11.5–15.5)
WBC: 12.8 10*3/uL — ABNORMAL HIGH (ref 4.0–10.5)

## 2014-02-23 LAB — BASIC METABOLIC PANEL
ANION GAP: 13 (ref 5–15)
BUN: 15 mg/dL (ref 6–23)
CO2: 25 mEq/L (ref 19–32)
Calcium: 9 mg/dL (ref 8.4–10.5)
Chloride: 94 mEq/L — ABNORMAL LOW (ref 96–112)
Creatinine, Ser: 0.69 mg/dL (ref 0.50–1.35)
GFR calc non Af Amer: >90 mL/min (ref >90)
Glucose, Bld: 141 mg/dL — ABNORMAL HIGH (ref 70–99)
POTASSIUM: 4 meq/L (ref 3.7–5.3)
Sodium: 132 mEq/L — ABNORMAL LOW (ref 137–147)

## 2014-02-23 SURGERY — LUMBAR WOUND DEBRIDEMENT
Anesthesia: General | Site: Back

## 2014-02-23 MED ORDER — PHENYLEPHRINE HCL 10 MG/ML IJ SOLN
INTRAMUSCULAR | Status: DC | PRN
  Administered 2014-02-23 (×3): 80 ug via INTRAVENOUS

## 2014-02-23 MED ORDER — ONDANSETRON HCL 4 MG/2ML IJ SOLN: 4.0000 mg | INTRAMUSCULAR | Status: DC | PRN

## 2014-02-23 MED ORDER — OXYCODONE HCL 5 MG PO TABS
5.0000 mg | ORAL_TABLET | Freq: Once | ORAL | Status: AC | PRN
  Administered 2014-02-23: 5 mg via ORAL

## 2014-02-23 MED ORDER — MIDAZOLAM HCL 2 MG/2ML IJ SOLN
INTRAMUSCULAR | Status: AC
  Filled 2014-02-23: qty 2

## 2014-02-23 MED ORDER — MIDAZOLAM HCL 5 MG/5ML IJ SOLN
INTRAMUSCULAR | Status: DC | PRN
  Administered 2014-02-23: 2 mg via INTRAVENOUS

## 2014-02-23 MED ORDER — LIDOCAINE HCL (CARDIAC) 20 MG/ML IV SOLN
INTRAVENOUS | Status: AC
Start: 2014-02-23 — End: 2014-02-23
  Filled 2014-02-23: qty 5

## 2014-02-23 MED ORDER — ROCURONIUM BROMIDE 100 MG/10ML IV SOLN
INTRAVENOUS | Status: DC | PRN
  Administered 2014-02-23: 40 mg via INTRAVENOUS
  Administered 2014-02-23: 10 mg via INTRAVENOUS

## 2014-02-23 MED ORDER — ONDANSETRON HCL 4 MG/2ML IJ SOLN
INTRAMUSCULAR | Status: DC | PRN
  Administered 2014-02-23: 4 mg via INTRAVENOUS

## 2014-02-23 MED ORDER — OXYCODONE HCL 5 MG/5ML PO SOLN: 5.0000 mg | Freq: Once | ORAL | Status: AC | PRN

## 2014-02-23 MED ORDER — SUCCINYLCHOLINE CHLORIDE 20 MG/ML IJ SOLN
INTRAMUSCULAR | Status: AC
  Filled 2014-02-23: qty 1

## 2014-02-23 MED ORDER — SODIUM CHLORIDE 0.9 % IR SOLN
Status: DC | PRN
  Administered 2014-02-23: 20:00:00

## 2014-02-23 MED ORDER — PROPOFOL 10 MG/ML IV BOLUS
INTRAVENOUS | Status: AC
  Filled 2014-02-23: qty 20

## 2014-02-23 MED ORDER — 0.9 % SODIUM CHLORIDE (POUR BTL) OPTIME
TOPICAL | Status: DC | PRN
  Administered 2014-02-23: 1000 mL

## 2014-02-23 MED ORDER — ONDANSETRON HCL 4 MG/2ML IJ SOLN
INTRAMUSCULAR | Status: AC
  Filled 2014-02-23: qty 2

## 2014-02-23 MED ORDER — THROMBIN 5000 UNITS EX SOLR
CUTANEOUS | Status: DC | PRN
  Administered 2014-02-23 (×2): 5000 [IU] via TOPICAL

## 2014-02-23 MED ORDER — HYDROMORPHONE HCL PF 1 MG/ML IJ SOLN
INTRAMUSCULAR | Status: AC
  Filled 2014-02-23: qty 1

## 2014-02-23 MED ORDER — METOCLOPRAMIDE HCL 5 MG/ML IJ SOLN: 10.0000 mg | Freq: Once | INTRAMUSCULAR | Status: DC | PRN

## 2014-02-23 MED ORDER — HYDROMORPHONE HCL PF 1 MG/ML IJ SOLN
0.2500 mg | INTRAMUSCULAR | Status: DC | PRN
  Administered 2014-02-23 (×4): 0.5 mg via INTRAVENOUS

## 2014-02-23 MED ORDER — ACETAMINOPHEN 650 MG RE SUPP: 650.0000 mg | RECTAL | Status: DC | PRN

## 2014-02-23 MED ORDER — ACETAMINOPHEN 325 MG PO TABS
650.0000 mg | ORAL_TABLET | ORAL | Status: DC | PRN
  Administered 2014-02-25 – 2014-02-26 (×2): 650 mg via ORAL
  Filled 2014-02-23 (×2): qty 2

## 2014-02-23 MED ORDER — FENTANYL CITRATE 0.05 MG/ML IJ SOLN
INTRAMUSCULAR | Status: AC
  Filled 2014-02-23: qty 5

## 2014-02-23 MED ORDER — HEMOSTATIC AGENTS (NO CHARGE) OPTIME
TOPICAL | Status: DC | PRN
  Administered 2014-02-23: 1 via TOPICAL

## 2014-02-23 MED ORDER — SODIUM CHLORIDE 0.9 % IV SOLN: 250.0000 mL | INTRAVENOUS | Status: DC

## 2014-02-23 MED ORDER — PROPOFOL 10 MG/ML IV BOLUS
INTRAVENOUS | Status: DC | PRN
  Administered 2014-02-23: 40 mg via INTRAVENOUS
  Administered 2014-02-23: 50 mg via INTRAVENOUS
  Administered 2014-02-23: 30 mg via INTRAVENOUS
  Administered 2014-02-23: 200 mg via INTRAVENOUS

## 2014-02-23 MED ORDER — MICROFIBRILLAR COLL HEMOSTAT EX PADS
MEDICATED_PAD | CUTANEOUS | Status: DC | PRN
  Administered 2014-02-23: 1 via TOPICAL

## 2014-02-23 MED ORDER — LACTATED RINGERS IV SOLN
INTRAVENOUS | Status: DC
  Administered 2014-02-23 (×2): via INTRAVENOUS

## 2014-02-23 MED ORDER — NEOSTIGMINE METHYLSULFATE 10 MG/10ML IV SOLN
INTRAVENOUS | Status: AC
  Filled 2014-02-23: qty 1

## 2014-02-23 MED ORDER — GLYCOPYRROLATE 0.2 MG/ML IJ SOLN
INTRAMUSCULAR | Status: DC | PRN
  Administered 2014-02-23: 0.4 mg via INTRAVENOUS

## 2014-02-23 MED ORDER — SODIUM CHLORIDE 0.9 % IJ SOLN: 3.0000 mL | INTRAMUSCULAR | Status: DC | PRN

## 2014-02-23 MED ORDER — OXYCODONE HCL 5 MG PO TABS
ORAL_TABLET | ORAL | Status: AC
  Filled 2014-02-23: qty 1

## 2014-02-23 MED ORDER — GLYCOPYRROLATE 0.2 MG/ML IJ SOLN
INTRAMUSCULAR | Status: AC
  Filled 2014-02-23: qty 2

## 2014-02-23 MED ORDER — ROCURONIUM BROMIDE 50 MG/5ML IV SOLN
INTRAVENOUS | Status: AC
  Filled 2014-02-23: qty 1

## 2014-02-23 MED ORDER — NEOSTIGMINE METHYLSULFATE 10 MG/10ML IV SOLN
INTRAVENOUS | Status: DC | PRN
  Administered 2014-02-23: 3 mg via INTRAVENOUS

## 2014-02-23 MED ORDER — SODIUM CHLORIDE 0.9 % IJ SOLN
3.0000 mL | Freq: Two times a day (BID) | INTRAMUSCULAR | Status: DC
  Administered 2014-02-25 – 2014-03-08 (×14): 3 mL via INTRAVENOUS

## 2014-02-23 MED ORDER — MENTHOL 3 MG MT LOZG: 1.0000 | LOZENGE | OROMUCOSAL | Status: DC | PRN

## 2014-02-23 MED ORDER — LIDOCAINE HCL (CARDIAC) 20 MG/ML IV SOLN
INTRAVENOUS | Status: DC | PRN
  Administered 2014-02-23: 50 mg via INTRAVENOUS

## 2014-02-23 MED ORDER — PHENOL 1.4 % MT LIQD: 1.0000 | OROMUCOSAL | Status: DC | PRN

## 2014-02-23 MED ORDER — FENTANYL CITRATE 0.05 MG/ML IJ SOLN
INTRAMUSCULAR | Status: DC | PRN
  Administered 2014-02-23 (×3): 50 ug via INTRAVENOUS

## 2014-02-23 SURGICAL SUPPLY — 47 items
BAG DECANTER FOR FLEXI CONT (MISCELLANEOUS) ×2 IMPLANT
BENZOIN TINCTURE PRP APPL 2/3 (GAUZE/BANDAGES/DRESSINGS) ×2 IMPLANT
BLADE 10 SAFETY STRL DISP (BLADE) IMPLANT
BLADE SURG ROTATE 9660 (MISCELLANEOUS) IMPLANT
BRUSH SCRUB EZ PLAIN DRY (MISCELLANEOUS) ×2 IMPLANT
CANISTER SUCT 3000ML (MISCELLANEOUS) ×2 IMPLANT
CLEANER TIP ELECTROSURG 2X2 (MISCELLANEOUS) ×2 IMPLANT
CONT SPEC 4OZ CLIKSEAL STRL BL (MISCELLANEOUS) IMPLANT
COVER SURGICAL LIGHT HANDLE (MISCELLANEOUS) ×2 IMPLANT
DRAPE LAPAROTOMY 100X72X124 (DRAPES) ×2 IMPLANT
DRSG TELFA 3X8 NADH (GAUZE/BANDAGES/DRESSINGS) ×2 IMPLANT
DURASEAL APPLICATOR TIP (TIP) ×2 IMPLANT
DURASEAL SPINE SEALANT 3ML (MISCELLANEOUS) ×2 IMPLANT
ELECT REM PT RETURN 9FT ADLT (ELECTROSURGICAL) ×2
ELECTRODE REM PT RTRN 9FT ADLT (ELECTROSURGICAL) ×1 IMPLANT
GAUZE SPONGE 4X4 16PLY XRAY LF (GAUZE/BANDAGES/DRESSINGS) IMPLANT
GLOVE ECLIPSE 8.0 STRL XLNG CF (GLOVE) ×2 IMPLANT
GLOVE EXAM NITRILE LRG STRL (GLOVE) IMPLANT
GLOVE EXAM NITRILE XL STR (GLOVE) IMPLANT
GLOVE EXAM NITRILE XS STR PU (GLOVE) IMPLANT
GOWN STRL REUS W/ TWL LRG LVL3 (GOWN DISPOSABLE) ×2 IMPLANT
GOWN STRL REUS W/ TWL XL LVL3 (GOWN DISPOSABLE) IMPLANT
GOWN STRL REUS W/TWL 2XL LVL3 (GOWN DISPOSABLE) IMPLANT
GOWN STRL REUS W/TWL LRG LVL3 (GOWN DISPOSABLE) ×2
GOWN STRL REUS W/TWL XL LVL3 (GOWN DISPOSABLE)
KIT BASIN OR (CUSTOM PROCEDURE TRAY) ×2 IMPLANT
KIT ROOM TURNOVER OR (KITS) ×2 IMPLANT
NS IRRIG 1000ML POUR BTL (IV SOLUTION) ×2 IMPLANT
PACK LAMINECTOMY NEURO (CUSTOM PROCEDURE TRAY) ×2 IMPLANT
PAD ARMBOARD 7.5X6 YLW CONV (MISCELLANEOUS) ×6 IMPLANT
SPONGE GAUZE 4X4 12PLY (GAUZE/BANDAGES/DRESSINGS) ×2 IMPLANT
SPONGE GAUZE 4X4 12PLY STER LF (GAUZE/BANDAGES/DRESSINGS) ×2 IMPLANT
SPONGE SURGIFOAM ABS GEL SZ50 (HEMOSTASIS) ×2 IMPLANT
STAPLER SKIN PROX WIDE 3.9 (STAPLE) IMPLANT
STRIP CLOSURE SKIN 1/2X4 (GAUZE/BANDAGES/DRESSINGS) IMPLANT
SUT PROLENE 6 0 BV (SUTURE) ×4 IMPLANT
SUT VIC AB 0 CT1 18XCR BRD 8 (SUTURE) ×2 IMPLANT
SUT VIC AB 0 CT1 8-18 (SUTURE) ×2
SUT VIC AB 2-0 OS6 18 (SUTURE) ×6 IMPLANT
SUT VIC AB 3-0 CP2 18 (SUTURE) ×2 IMPLANT
SWAB CULTURE LIQ STUART DBL (MISCELLANEOUS) IMPLANT
SYR 20ML ECCENTRIC (SYRINGE) ×2 IMPLANT
TAPE CLOTH 3X10 TAN LF (GAUZE/BANDAGES/DRESSINGS) ×2 IMPLANT
TOWEL OR 17X24 6PK STRL BLUE (TOWEL DISPOSABLE) ×2 IMPLANT
TOWEL OR 17X26 10 PK STRL BLUE (TOWEL DISPOSABLE) ×2 IMPLANT
TUBE ANAEROBIC SPECIMEN COL (MISCELLANEOUS) IMPLANT
WATER STERILE IRR 1000ML POUR (IV SOLUTION) ×2 IMPLANT

## 2014-02-23 NOTE — Progress Notes (Signed)
[facility] PROGRESS NOTE  Willie Howe WJX:[id number] DOB: 1961-08-15 DOA: 02/20/2014 PCP: PROVIDER NOT IN SYSTEM  Assessment/Plan:  1. CSF Leak, L3-4 on left -per myelogram, Likely etiology of the headaches -A[place] surgery today, per Dr Hal Neer   2.Headache/N/V/fever (102)  -leak above likely etiology of headaches, cannot rule out post op infection/meningitis  -Wound site appears unremarkable  -Ct head benign  -LP will not be easy given Lumbar surgery  - on empiric Abx (Vanc/Ceftriaxone), afebrile since admission  -WBC trending down, blood cultures still pending, follow  3. E. Coli UTI  -had a catheter last admission, which may be responsible  -Continue Ceftriaxone  4.. H/o RA:  -MTX on hold, On stress dose steroids due to chronic prednisone  -follow and taper down   Code Status: full Family Communication: mother and sisters ay bedside Disposition Plan: to home when medically ready   Consultants:  NS  Procedures:  none  Antibiotics:  Vanc & Rocephin started on 7/7   HPI/Subjective: Denies any new, pt complaining he can't get meds as peipheral IV out - family at bedside frustated about access  Objective: Filed Vitals:   02/23/14 1404  BP: 124/70  Pulse: 66  Temp: 97.7 F (36.5 C)  Resp: 18    Intake/Output Summary (Last 24 hours) at 02/23/14 1655 Last data filed at 02/23/14 1225  Gross per 24 hour  Intake 3496.25 ml  Output    850 ml  Net 2646.25 ml   Filed Weights   02/20/14 2022 02/21/14 0215  Weight: 117.935 kg (260 lb) 116.9 kg (257 lb 11.5 oz)    Exam:  General: alert & oriented x 3 In NAD Cardiovascular: RRR, nl S1 s2 Respiratory: CTAB Abdomen: soft +BS NT/ND, no masses palpable Back: lumbarspine area with incision clean and dry Extremities: No cyanosis and no edema    Data Reviewed: Basic Metabolic Panel:  Recent Labs Lab 02/20/14 2054 02/21/14 0425 02/23/14 0539  NA 135* 132* 132*  K 4.5 4.6 4.0  CL 95* 95* 94*   CO2 26 24 25   GLUCOSE 129* 155* 141*  BUN 11 11 15   CREATININE 0.73 0.64 0.69  CALCIUM 9.3 8.9 9.0   Liver Function Tests:  Recent Labs Lab 02/20/14 2054 02/21/14 0425  AST 23 19  ALT 69* 57*  ALKPHOS 116 104  BILITOT 0.4 0.3  PROT 7.6 6.8  ALBUMIN 3.2* 2.8*   No results found for this basename: LIPASE, AMYLASE,  in the last 168 hours No results found for this basename: AMMONIA,  in the last 168 hours CBC:  Recent Labs Lab 02/20/14 2054 02/21/14 0425 02/23/14 0539  WBC 17.9* 19.9* 12.8*  NEUTROABS 14.5*  --   --   HGB 12.8* 11.4* 10.7*  HCT 38.2* 34.4* 32.5*  MCV 87.6 87.8 86.4  PLT 426* 403* 445*   Cardiac Enzymes: No results found for this basename: CKTOTAL, CKMB, CKMBINDEX, TROPONINI,  in the last 168 hours BNP (last 3 results) No results found for this basename: PROBNP,  in the last 8760 hours CBG: No results found for this basename: GLUCAP,  in the last 168 hours  Recent Results (from the past 240 hour(s))  URINE CULTURE     Status: None   Collection Time    02/20/14 11:07 PM      Result Value Ref Range Status   Specimen Description URINE, CLEAN CATCH   Final   Special Requests ADDED 0040 02/21/14   Final   Culture  Setup Time  Final   Value: 02/21/2014 03:58     Performed at [facility]     Final   Value: >=100,000 COLONIES/ML     Performed at [facility]   Culture     Final   Value: ESCHERICHIA COLI     Performed at [facility]   Report Status 02/22/2014 FINAL   Final   Organism ID, Bacteria ESCHERICHIA COLI   Final  CULTURE, BLOOD (ROUTINE X 2)     Status: None   Collection Time    02/21/14  1:37 AM      Result Value Ref Range Status   Specimen Description BLOOD LEFT HAND   Final   Special Requests     Final   Value: BOTTLES DRAWN AEROBIC AND ANAEROBIC ANAEROBIC 6CC AEROBIC 12CC   Culture  Setup Time     Final   Value: 02/21/2014 08:34     Performed at [facility]   Culture     Final    Value:        BLOOD CULTURE RECEIVED NO GROWTH TO DATE CULTURE WILL BE HELD FOR 5 DAYS BEFORE ISSUING A FINAL NEGATIVE REPORT     Performed at [facility]   Report Status PENDING   Incomplete  CULTURE, BLOOD (ROUTINE X 2)     Status: None   Collection Time    02/21/14  1:37 AM      Result Value Ref Range Status   Specimen Description BLOOD RIGHT FOREARM   Final   Special Requests BOTTLES DRAWN AEROBIC AND ANAEROBIC 6CC EA   Final   Culture  Setup Time     Final   Value: 02/21/2014 08:34     Performed at [facility]   Culture     Final   Value:        BLOOD CULTURE RECEIVED NO GROWTH TO DATE CULTURE WILL BE HELD FOR 5 DAYS BEFORE ISSUING A FINAL NEGATIVE REPORT     Performed at [facility]   Report Status PENDING   Incomplete     Studies: Ct Lumbar Spine W Contrast  02/22/2014   CLINICAL DATA:  Extension of lumbar fusion on 02/10/2014. Question CSF leak based on previous MR.  EXAM: LUMBAR MYELOGRAM  FLUOROSCOPY TIME:  3 min 42 seconds  PROCEDURE: After thorough discussion of risks and benefits of the procedure including bleeding, infection, injury to nerves, blood vessels, adjacent structures as well as headache and CSF leak, written and oral informed consent was obtained. Consent was obtained by Dr. Nelson Chimes. Time out form was completed.  In evaluating the previous MRI, it is evident that there is a large amount of subdural fluid. This complicates the choices for needle approach. The conus tip is at L1. At the L1-2 level, the subdural fluid occupies most of the spinal canal and I think it would be unlikely to be successful. Because of the artifact, I could not tell if the subdural fluid when all the way to the tip of the thecal sac and therefore decided to use that approach initially.  Patient was positioned prone on the fluoroscopy table. Local anesthesia was provided with 1% lidocaine without epinephrine after prepped and draped in the usual sterile fashion.  Puncture was performed at S1-2 level initially using a 5 inch 22-gauge spinal needle via a left-sided approach. I could not convincingly gad CSF fluid return and therefore approach at the L5-S1 level. 15 mL of  Omnipaque-180 was injected. It is evident that the fluid, even from this approach comment is subdural. Nonetheless, this should be sufficient to define the site of leak.  I personally performed the lumbar puncture and administered the intrathecal contrast. I also personally performed acquisition of the myelogram images.  TECHNIQUE: Contiguous axial images were obtained through the Lumbar spine after the intrathecal infusion of infusion. Coronal and sagittal reconstructions were obtained of the axial image sets.  COMPARISON:  MRI 02/15/2014.  Multiple older studies.  FINDINGS: LUMBAR MYELOGRAM FINDINGS:  Myelogram images do in fact show subdural contrast extending throughout the region. There is no visible epidural contrast and therefore the findings should be valid.  CT LUMBAR MYELOGRAM FINDINGS:  As described above, note that the contrast was injected at the L5-S1 level and communicated with the subdural space.  Contrast fills a large subdural collection that extends throughout the region, compressing the arachnoid space into a flat configuration in the center of the spinal canal. Contrast injection appears entirely subdural. Contrast appears to flow from the subdural space into the posterior soft tissue fluid collection from a defect on the left at the L3-4 disc level.  Newly placed hardware appears well positioned. Interbody material at L3-4 appears well positioned.  IMPRESSION: CSF leak demonstrated on the left at the L3-4 disc level. Contrast injected at the L5-S1 level, filling the large subdural space fluid collections, extends into the posterior fluid collection. See above for full discussion.   Electronically Signed   By: Nelson Chimes M.D.   On: 02/22/2014 14:28   Dg Myelography Lumbar Inj  Lumbosacral  02/22/2014   CLINICAL DATA:  Extension of lumbar fusion on 02/10/2014. Question CSF leak based on previous MR.  EXAM: LUMBAR MYELOGRAM  FLUOROSCOPY TIME:  3 min 42 seconds  PROCEDURE: After thorough discussion of risks and benefits of the procedure including bleeding, infection, injury to nerves, blood vessels, adjacent structures as well as headache and CSF leak, written and oral informed consent was obtained. Consent was obtained by Dr. Nelson Chimes. Time out form was completed.  In evaluating the previous MRI, it is evident that there is a large amount of subdural fluid. This complicates the choices for needle approach. The conus tip is at L1. At the L1-2 level, the subdural fluid occupies most of the spinal canal and I think it would be unlikely to be successful. Because of the artifact, I could not tell if the subdural fluid when all the way to the tip of the thecal sac and therefore decided to use that approach initially.  Patient was positioned prone on the fluoroscopy table. Local anesthesia was provided with 1% lidocaine without epinephrine after prepped and draped in the usual sterile fashion. Puncture was performed at S1-2 level initially using a 5 inch 22-gauge spinal needle via a left-sided approach. I could not convincingly gad CSF fluid return and therefore approach at the L5-S1 level. 15 mL of Omnipaque-180 was injected. It is evident that the fluid, even from this approach comment is subdural. Nonetheless, this should be sufficient to define the site of leak.  I personally performed the lumbar puncture and administered the intrathecal contrast. I also personally performed acquisition of the myelogram images.  TECHNIQUE: Contiguous axial images were obtained through the Lumbar spine after the intrathecal infusion of infusion. Coronal and sagittal reconstructions were obtained of the axial image sets.  COMPARISON:  MRI 02/15/2014.  Multiple older studies.  FINDINGS: LUMBAR MYELOGRAM  FINDINGS:  Myelogram images do in fact show  subdural contrast extending throughout the region. There is no visible epidural contrast and therefore the findings should be valid.  CT LUMBAR MYELOGRAM FINDINGS:  As described above, note that the contrast was injected at the L5-S1 level and communicated with the subdural space.  Contrast fills a large subdural collection that extends throughout the region, compressing the arachnoid space into a flat configuration in the center of the spinal canal. Contrast injection appears entirely subdural. Contrast appears to flow from the subdural space into the posterior soft tissue fluid collection from a defect on the left at the L3-4 disc level.  Newly placed hardware appears well positioned. Interbody material at L3-4 appears well positioned.  IMPRESSION: CSF leak demonstrated on the left at the L3-4 disc level. Contrast injected at the L5-S1 level, filling the large subdural space fluid collections, extends into the posterior fluid collection. See above for full discussion.   Electronically Signed   By: Nelson Chimes M.D.   On: 02/22/2014 14:28    Scheduled Meds: . cefTRIAXone (ROCEPHIN)  IV  1 g Intravenous Q12H  . folic acid  1 mg Oral Daily  . heparin  5,000 Units Subcutaneous 3 times per day  . vancomycin  1,000 mg Intravenous Q8H   Continuous Infusions: . sodium chloride 75 mL/hr at 02/23/14 0326    Principal Problem:   Headache(784.0) Active Problems:   UTI (lower urinary tract infection)   Nausea with vomiting   Rheumatoid arthritis   Nausea & vomiting    Time spent: [facility] Pager [phone]. If 7PM-7AM, please contact night-coverage at [email or web address], password [facility] 02/23/2014, 4:55 PM  LOS: 3 days

## 2014-02-23 NOTE — Anesthesia Procedure Notes (Signed)
Procedure Name: Intubation Date/Time: 02/23/2014 7:31 PM Performed by: Julian Reil Pre-anesthesia Checklist: Patient identified, Emergency Drugs available, Suction available and Patient being monitored Patient Re-evaluated:Patient Re-evaluated prior to inductionOxygen Delivery Method: Circle system utilized Preoxygenation: Pre-oxygenation with 100% oxygen Intubation Type: IV induction Ventilation: Mask ventilation without difficulty Laryngoscope Size: Mac and 4 Grade View: Grade I Tube type: Oral Tube size: 7.5 mm Number of attempts: 1 Airway Equipment and Method: Stylet Placement Confirmation: ETT inserted through vocal cords under direct vision,  positive ETCO2 and breath sounds checked- equal and bilateral Secured at: 22 cm Tube secured with: Tape Dental Injury: Teeth and Oropharynx as per pre-operative assessment

## 2014-02-23 NOTE — Progress Notes (Signed)
IV restart unsuccessful.  IV team notified to restart.

## 2014-02-23 NOTE — Transfer of Care (Signed)
Immediate Anesthesia Transfer of Care Note  Patient: Willie Howe  Procedure(s) Performed: Procedure(s) with comments: LUMBAR WOUND DEBRIDEMENT (N/A) - exploration lumbar wound. repair of dural defect.  Patient Location: PACU  Anesthesia Type:General  Level of Consciousness: awake, alert , oriented and patient cooperative  Airway & Oxygen Therapy: Patient Spontanous Breathing and Patient connected to face mask oxygen  Post-op Assessment: Report given to PACU RN, Post -op Vital signs reviewed and stable and Patient moving all extremities  Post vital signs: Reviewed and stable  Complications: No apparent anesthesia complications

## 2014-02-23 NOTE — Anesthesia Postprocedure Evaluation (Signed)
  Anesthesia Post-op Note  Patient: Willie Howe  Procedure(s) Performed: Procedure(s) with comments: LUMBAR WOUND DEBRIDEMENT (N/A) - exploration lumbar wound. repair of dural defect.  Patient Location: PACU  Anesthesia Type:General  Level of Consciousness: awake, alert , oriented and patient cooperative  Airway and Oxygen Therapy: Patient Spontanous Breathing and Patient connected to nasal cannula oxygen  Post-op Pain: mild  Post-op Assessment: Post-op Vital signs reviewed, Patient's Cardiovascular Status Stable, Respiratory Function Stable, Patent Airway, No signs of Nausea or vomiting and Pain level controlled  Post-op Vital Signs: Reviewed and stable  Last Vitals:  Filed Vitals:   02/23/14 2245  BP: 131/81  Pulse: 77  Temp: 36.4 C  Resp: 16    Complications: No apparent anesthesia complications

## 2014-02-23 NOTE — Anesthesia Preprocedure Evaluation (Addendum)
Anesthesia Evaluation  Patient identified by MRN, date of birth, ID band Patient awake    Reviewed: Allergy & Precautions, H&P , NPO status , Patient's Chart, lab work & pertinent test results, reviewed documented beta blocker date and time   History of Anesthesia Complications Negative for: history of anesthetic complications  Airway Mallampati: II TM Distance: >3 FB Neck ROM: full    Dental  (+) Dental Advisory Given   Pulmonary neg pulmonary ROS, former smoker (quit '96),  breath sounds clear to auscultation        Cardiovascular negative cardio ROS  + dysrhythmias Rhythm:regular Rate:Normal  '11 stress ECHO: EF 55-60%, valves OK   Neuro/Psych  Headaches, negative psych ROS   GI/Hepatic negative GI ROS, Neg liver ROS,   Endo/Other  Morbid obesity  Renal/GU negative Renal ROS  negative genitourinary   Musculoskeletal  (+) Arthritis - (methotrexate and steroids), Rheumatoid disorders and on steriods ,    Abdominal (+) + obese,   Peds  Hematology negative hematology ROS (+)   Anesthesia Other Findings See surgeon's H&P   Reproductive/Obstetrics negative OB ROS                        Anesthesia Physical Anesthesia Plan  ASA: III  Anesthesia Plan: General   Post-op Pain Management:    Induction: Intravenous  Airway Management Planned: Oral ETT  Additional Equipment:   Intra-op Plan:   Post-operative Plan: Extubation in OR  Informed Consent: I have reviewed the patients History and Physical, chart, labs and discussed the procedure including the risks, benefits and alternatives for the proposed anesthesia with the patient or authorized representative who has indicated his/her understanding and acceptance.   Dental Advisory Given  Plan Discussed with: CRNA and Surgeon  Anesthesia Plan Comments: (Plan routine monitors, GETA)       Anesthesia Quick Evaluation

## 2014-02-23 NOTE — Op Note (Signed)
Preop diagnosis: CSF leakage Postop diagnosis: Same Procedure: Exploration of lumbar wound with dural patch of dural defect Surgeon: Ashey Tramontana  After being placed the prone position the patient's back was prepped and draped in the usual sterile fashion. Previous lumbar incision was opened and a large clear fluid collection was immediately encountered. This was evacuated an incision was easily opened down to the epidural space. Self-retaining tract was placed for exposure. An obvious large dural defect was noted on almost the entire left hemi-thecal sac down almost to the floor the canal. There was spinal fluid exiting at obviously. Numerous attempts were made to try and states this closed but the stitches would not hold. It was therefore elected to place a patch graft over it of muscle and Surgicel. The wound was irrigated an appropriately size and shape patch was fashioned. We then placed dura seal over this. We then incised the muscle to incite bleeding down onto the patch area. We then placed Gelfoam over the dural patch and allowed additional bleeding to complete a blood patch. We then irrigated copiously metabolic irrigation and closed the wound in multiple layers of Vicryl on the muscle fascia subcutaneous and subcuticular tissues. A running locking Prolene was placed on the skin. A sterile compressive dressing was placed on the skin and the patient was extubated and taken to recovery room in stable condition.

## 2014-02-24 ENCOUNTER — Encounter (HOSPITAL_COMMUNITY): Payer: Self-pay | Admitting: Neurosurgery

## 2014-02-24 LAB — VANCOMYCIN, TROUGH: Vancomycin Tr: 41.7 ug/mL (ref 10.0–20.0)

## 2014-02-24 MED ORDER — HYDROMORPHONE HCL PF 1 MG/ML IJ SOLN
1.5000 mg | INTRAMUSCULAR | Status: DC | PRN
  Administered 2014-02-24 – 2014-02-25 (×4): 1.5 mg via INTRAVENOUS
  Administered 2014-02-25: 1 mg via INTRAVENOUS
  Administered 2014-02-26: 1.5 mg via INTRAVENOUS
  Administered 2014-02-26: 1 mg via INTRAVENOUS
  Administered 2014-02-26: 1.5 mg via INTRAVENOUS
  Administered 2014-02-26: 1 mg via INTRAVENOUS
  Administered 2014-02-27 – 2014-03-07 (×3): 1.5 mg via INTRAVENOUS
  Filled 2014-02-24 (×12): qty 2

## 2014-02-24 MED ORDER — HYDROMORPHONE HCL 2 MG PO TABS
8.0000 mg | ORAL_TABLET | ORAL | Status: DC | PRN
  Administered 2014-02-24 – 2014-02-25 (×3): 8 mg via ORAL
  Administered 2014-02-25: 4 mg via ORAL
  Administered 2014-02-25: 8 mg via ORAL
  Administered 2014-02-26: 4 mg via ORAL
  Administered 2014-02-26: 8 mg via ORAL
  Filled 2014-02-24 (×7): qty 4

## 2014-02-24 NOTE — Progress Notes (Addendum)
[facility] PROGRESS NOTE  Willie Howe LPF:[id number] DOB: Jul 21, 1961 DOA: 02/20/2014 PCP: PROVIDER NOT IN SYSTEM  Assessment/Plan:  1. CSF Leak, L3-4 on left -per myelogram, Likely etiology of the headaches - s/p Exploration of lumbar wound with dural patch of dural defect per Dr Hal Neer on 7/9 -per NS keep flat for probably about 1 week as the dural defect was rather sizable and unable to be closed primarily.  2.Headache/N/V/fever (102)  -leak above likely etiology of headaches, cannot rule out post op infection/meningitis  -Wound site appears unremarkable  -Ct head benign  -LP will not be easy given Lumbar surgery  - on empiric Abx (Vanc/Ceftriaxone), had been afebrile since admission but fever to 100.7 am 7/11 -follow and discuss further empiric abx for CSF infection/meningitis with NS  3. E. Coli UTI  -had a catheter last admission, which may be responsible  -Continue Ceftriaxone  4.. H/o RA:  -MTX on hold, On stress dose steroids due to chronic prednisone  -follow and taper down   Code Status: full Family Communication: wife at bedside Disposition Plan: to home when medically ready   Consultants:  NS  Procedures:  Exploration of lumbar wound with dural patch of dural defect- per Dr Hal Neer on 7/9   Antibiotics:  Vanc & Rocephin started on 7/7   HPI/Subjective:  states feels better today, working on comfortable position to keep him flat on his back   Objective: Filed Vitals:   02/24/14 2130  BP: 123/68  Pulse: 90  Temp: 99.2 F (37.3 C)  Resp: 18    Intake/Output Summary (Last 24 hours) at 02/24/14 2135 Last data filed at 02/24/14 1700  Gross per 24 hour  Intake   1200 ml  Output   1175 ml  Net     25 ml   Filed Weights   02/20/14 2022 02/21/14 0215  Weight: 117.935 kg (260 lb) 116.9 kg (257 lb 11.5 oz)    Exam:  General: alert & oriented x 3 In NAD Cardiovascular: RRR, nl S1 s2 Respiratory: CTAB Abdomen: soft +BS NT/ND, no masses  palpable Extremities: No cyanosis and no edema    Data Reviewed: Basic Metabolic Panel:  Recent Labs Lab 02/20/14 2054 02/21/14 0425 02/23/14 0539  NA 135* 132* 132*  K 4.5 4.6 4.0  CL 95* 95* 94*  CO2 26 24 25   GLUCOSE 129* 155* 141*  BUN 11 11 15   CREATININE 0.73 0.64 0.69  CALCIUM 9.3 8.9 9.0   Liver Function Tests:  Recent Labs Lab 02/20/14 2054 02/21/14 0425  AST 23 19  ALT 69* 57*  ALKPHOS 116 104  BILITOT 0.4 0.3  PROT 7.6 6.8  ALBUMIN 3.2* 2.8*   No results found for this basename: LIPASE, AMYLASE,  in the last 168 hours No results found for this basename: AMMONIA,  in the last 168 hours CBC:  Recent Labs Lab 02/20/14 2054 02/21/14 0425 02/23/14 0539  WBC 17.9* 19.9* 12.8*  NEUTROABS 14.5*  --   --   HGB 12.8* 11.4* 10.7*  HCT 38.2* 34.4* 32.5*  MCV 87.6 87.8 86.4  PLT 426* 403* 445*   Cardiac Enzymes: No results found for this basename: CKTOTAL, CKMB, CKMBINDEX, TROPONINI,  in the last 168 hours BNP (last 3 results) No results found for this basename: PROBNP,  in the last 8760 hours CBG: No results found for this basename: GLUCAP,  in the last 168 hours  Recent Results (from the past 240 hour(s))  URINE CULTURE  Status: None   Collection Time    02/20/14 11:07 PM      Result Value Ref Range Status   Specimen Description URINE, CLEAN CATCH   Final   Special Requests ADDED 0040 02/21/14   Final   Culture  Setup Time     Final   Value: 02/21/2014 03:58     Performed at [place]     Final   Value: >=100,000 COLONIES/ML     Performed at [facility]   Culture     Final   Value: ESCHERICHIA COLI     Performed at [facility]   Report Status 02/22/2014 FINAL   Final   Organism ID, Bacteria ESCHERICHIA COLI   Final  CULTURE, BLOOD (ROUTINE X 2)     Status: None   Collection Time    02/21/14  1:37 AM      Result Value Ref Range Status   Specimen Description BLOOD LEFT HAND   Final   Special  Requests     Final   Value: BOTTLES DRAWN AEROBIC AND ANAEROBIC ANAEROBIC 6CC AEROBIC 12CC   Culture  Setup Time     Final   Value: 02/21/2014 08:34     Performed at [facility]   Culture     Final   Value:        BLOOD CULTURE RECEIVED NO GROWTH TO DATE CULTURE WILL BE HELD FOR 5 DAYS BEFORE ISSUING A FINAL NEGATIVE REPORT     Performed at [facility]   Report Status PENDING   Incomplete  CULTURE, BLOOD (ROUTINE X 2)     Status: None   Collection Time    02/21/14  1:37 AM      Result Value Ref Range Status   Specimen Description BLOOD RIGHT FOREARM   Final   Special Requests BOTTLES DRAWN AEROBIC AND ANAEROBIC 6CC EA   Final   Culture  Setup Time     Final   Value: 02/21/2014 08:34     Performed at [facility]   Culture     Final   Value:        BLOOD CULTURE RECEIVED NO GROWTH TO DATE CULTURE WILL BE HELD FOR 5 DAYS BEFORE ISSUING A FINAL NEGATIVE REPORT     Performed at [facility]   Report Status PENDING   Incomplete     Studies: No results found.  Scheduled Meds: . cefTRIAXone (ROCEPHIN)  IV  1 g Intravenous Q12H  . folic acid  1 mg Oral Daily  . heparin  5,000 Units Subcutaneous 3 times per day  . sodium chloride  3 mL Intravenous Q12H  . vancomycin  1,000 mg Intravenous Q8H   Continuous Infusions: . sodium chloride 75 mL/hr at 02/24/14 1929  . sodium chloride 250 mL (02/23/14 2315)  . lactated ringers 50 mL/hr at 02/23/14 1842    Principal Problem:   Headache(784.0) Active Problems:   UTI (lower urinary tract infection)   Nausea with vomiting   Rheumatoid arthritis   Nausea & vomiting    Time spent: [place] [facility] Pager [phone]. If 7PM-7AM, please contact night-coverage at [email or web address], password [facility] 02/24/2014, 5:19 PM  LOS: 4 days

## 2014-02-24 NOTE — Progress Notes (Signed)
ANTIBIOTIC CONSULT NOTE - FOLLOW UP  Pharmacy Consult for vancomycin and ceftriaxone Indication: r/o meningitis, CSF leak; E coli UTI  No Known Allergies  Patient Measurements: Height: 6\' 3"  (190.5 cm) Weight: 257 lb 11.5 oz (116.9 kg) IBW/kg (Calculated) : 84.5  Vital Signs: Temp: 98 F (36.7 C) (07/10 1021) Temp src: Oral (07/10 1021) BP: 112/46 mmHg (07/10 1021) Pulse Rate: 94 (07/10 1021) Intake/Output from previous day: 07/09 0701 - 07/10 0700 In: 4556.3 [I.V.:4306.3; IV Piggyback:250] Out: 1475 [Urine:1475] Intake/Output from this shift: Total I/O In: 400 [P.O.:400] Out: -   Labs:  Recent Labs  02/23/14 0539  WBC 12.8*  HGB 10.7*  PLT 445*  CREATININE 0.69   Estimated Creatinine Clearance: 145.6 ml/min (by C-G formula based on Cr of 0.69). No results found for this basename: VANCOTROUGH, Corlis Leak, VANCORANDOM, GENTTROUGH, GENTPEAK, GENTRANDOM, TOBRATROUGH, TOBRAPEAK, TOBRARND, AMIKACINPEAK, AMIKACINTROU, AMIKACIN,  in the last 72 hours   Microbiology: Recent Results (from the past 720 hour(s))  SURGICAL PCR SCREEN     Status: None   Collection Time    02/02/14 10:27 AM      Result Value Ref Range Status   MRSA, PCR NEGATIVE  NEGATIVE Final   Staphylococcus aureus NEGATIVE  NEGATIVE Final   Comment:            The Xpert SA Assay (FDA     approved for NASAL specimens     in patients over 85 years of age),     is one component of     a comprehensive surveillance     program.  Test performance has     been validated by [employer] for patients greater     than or equal to 7 year old.     It is not intended     to diagnose infection nor to     guide or monitor treatment.  URINE CULTURE     Status: None   Collection Time    02/20/14 11:07 PM      Result Value Ref Range Status   Specimen Description URINE, CLEAN CATCH   Final   Special Requests ADDED 0040 02/21/14   Final   Culture  Setup Time     Final   Value: 02/21/2014 03:58     Performed at  [place]     Final   Value: >=100,000 COLONIES/ML     Performed at [facility]   Culture     Final   Value: ESCHERICHIA COLI     Performed at [facility]   Report Status 02/22/2014 FINAL   Final   Organism ID, Bacteria ESCHERICHIA COLI   Final  CULTURE, BLOOD (ROUTINE X 2)     Status: None   Collection Time    02/21/14  1:37 AM      Result Value Ref Range Status   Specimen Description BLOOD LEFT HAND   Final   Special Requests     Final   Value: BOTTLES DRAWN AEROBIC AND ANAEROBIC ANAEROBIC 6CC AEROBIC 12CC   Culture  Setup Time     Final   Value: 02/21/2014 08:34     Performed at [facility]   Culture     Final   Value:        BLOOD CULTURE RECEIVED NO GROWTH TO DATE CULTURE WILL BE HELD FOR 5 DAYS BEFORE ISSUING A FINAL NEGATIVE REPORT     Performed at [facility]   Report Status PENDING   Incomplete  CULTURE, BLOOD (ROUTINE X 2)     Status: None   Collection Time    02/21/14  1:37 AM      Result Value Ref Range Status   Specimen Description BLOOD RIGHT FOREARM   Final   Special Requests BOTTLES DRAWN AEROBIC AND ANAEROBIC 6CC EA   Final   Culture  Setup Time     Final   Value: 02/21/2014 08:34     Performed at [facility]   Culture     Final   Value:        BLOOD CULTURE RECEIVED NO GROWTH TO DATE CULTURE WILL BE HELD FOR 5 DAYS BEFORE ISSUING A FINAL NEGATIVE REPORT     Performed at [facility]   Report Status PENDING   Incomplete    Anti-infectives   Start     Dose/Rate Route Frequency Ordered Stop   02/23/14 2026  polymyxin B 500,000 Units, bacitracin 50,000 Units in sodium chloride irrigation 0.9 % 500 mL irrigation  Status:  Discontinued       As needed 02/23/14 2027 02/23/14 2148   02/21/14 1200  vancomycin (VANCOCIN) IVPB 1000 mg/200 mL premix     1,000 mg 200 mL/hr over 60 Minutes Intravenous Every 8 hours 02/21/14 0323     02/21/14 1000  cefTRIAXone (ROCEPHIN) 1 g in dextrose 5 %  50 mL IVPB     1 g 100 mL/hr over 30 Minutes Intravenous Every 12 hours 02/21/14 0323     02/21/14 0330  vancomycin (VANCOCIN) 2,500 mg in sodium chloride 0.9 % 500 mL IVPB     2,500 mg 250 mL/hr over 120 Minutes Intravenous  Once 02/21/14 0323 02/21/14 0630   02/21/14 0000  cefTRIAXone (ROCEPHIN) 1 g in dextrose 5 % 50 mL IVPB     1 g 100 mL/hr over 30 Minutes Intravenous  Once 02/20/14 2348 02/21/14 0105      Assessment: 53 y/o male who had lumbar surgery on 6/26 and was admitted 7/6 with fever and dizziness. He was found to have a CSF leak and is s/p lumbar wound exploration and repair of dural defect on 7/9. Today is day 4 vancomycin and ceftriaxone. Renal function is stable. WBC are trending down. Urine culture positive for E. coli and is sensitive to ceftriaxone.  Goal of Therapy:  Vancomycin trough level 15-20 mcg/ml  Plan:  - Vancomycin 1000 mg IV q8h - Ceftriaxone 1 g IV q12h - Vancomycin trough at 19:30 tonight - Monitor renal function and clinical progress  [facility], Pharm.D., BCPS Clinical Pharmacist Pager: [phone] 02/24/2014 11:41 AM

## 2014-02-24 NOTE — Progress Notes (Signed)
UR COMPLETED  

## 2014-02-24 NOTE — Progress Notes (Signed)
ANTIBIOTIC CONSULT NOTE: VANCOMYCIN Indication: R/o meningitis, CSF leak  Vancomycin trough = 41.7 on Vanc 1 gm IV q8h Goal vanc trough = 15-20  The vanc trough was higher than the desired range. The RN had already hung the next bag. The next dose will be held and a random level will be drawn at 8:30AM to determine a new dose for him.  Arrie Senate, PharmD

## 2014-02-24 NOTE — Progress Notes (Signed)
Lab called and stated patient Vanc. Trough was 41.7

## 2014-02-24 NOTE — Progress Notes (Signed)
Patient ID: Willie Howe, male   DOB: September 03, 1960, 53 y.o.   MRN: [id number] Afeb, vss No new neurologic issues with stable strength and sensation. Wound clean and dry. Will keep flat for probably about 1 week as the dural defect was rather sizable and unable to be closed primarily. He appears to be in pretty good spirits as he deals with this issue.

## 2014-02-25 LAB — BASIC METABOLIC PANEL
ANION GAP: 14 (ref 5–15)
BUN: 9 mg/dL (ref 6–23)
CO2: 25 meq/L (ref 19–32)
CREATININE: 0.78 mg/dL (ref 0.50–1.35)
Calcium: 8.7 mg/dL (ref 8.4–10.5)
Chloride: 93 mEq/L — ABNORMAL LOW (ref 96–112)
Glucose, Bld: 117 mg/dL — ABNORMAL HIGH (ref 70–99)
Potassium: 4.1 mEq/L (ref 3.7–5.3)
SODIUM: 132 meq/L — AB (ref 137–147)

## 2014-02-25 LAB — CBC
HCT: 34.6 % — ABNORMAL LOW (ref 39.0–52.0)
Hemoglobin: 11.4 g/dL — ABNORMAL LOW (ref 13.0–17.0)
MCH: 28.4 pg (ref 26.0–34.0)
MCHC: 32.9 g/dL (ref 30.0–36.0)
MCV: 86.3 fL (ref 78.0–100.0)
Platelets: 433 10*3/uL — ABNORMAL HIGH (ref 150–400)
RBC: 4.01 MIL/uL — AB (ref 4.22–5.81)
RDW: 13.9 % (ref 11.5–15.5)
WBC: 16.6 10*3/uL — ABNORMAL HIGH (ref 4.0–10.5)

## 2014-02-25 LAB — VANCOMYCIN, RANDOM: VANCOMYCIN RM: 9.3 ug/mL

## 2014-02-25 MED ORDER — VANCOMYCIN HCL IN DEXTROSE 1-5 GM/200ML-% IV SOLN
1000.0000 mg | Freq: Three times a day (TID) | INTRAVENOUS | Status: DC
  Administered 2014-02-25 – 2014-02-28 (×8): 1000 mg via INTRAVENOUS
  Filled 2014-02-25 (×11): qty 200

## 2014-02-25 NOTE — Progress Notes (Addendum)
[facility] PROGRESS NOTE  Willie Howe BDZ:[id number] DOB: 27-Nov-1960 DOA: 02/20/2014 PCP: PROVIDER NOT IN SYSTEM  Assessment/Plan:  1. CSF Leak, L3-4 on left -per myelogram, Likely etiology of the headaches - s/p Exploration of lumbar wound with dural patch of dural defect per Dr Hal Neer on 7/9 -per NS keep flat for probably about 1 week as the dural defect was rather sizable and unable to be closed primarily.  2.Headache/N/V/fever (102)  -leak above likely etiology of headaches, cannot rule out post op infection/meningitis  -Wound site appears unremarkable  -Ct head benign  -LP will not be easy given Lumbar surgery  - on empiric Abx (Vanc/Ceftriaxone), had been afebrile since admission but fever to 100.7 am 7/11 -follow and discuss further empiric abx for CSF infection/meningitis with NS  3. E. Coli UTI  -had a catheter last admission, which may be responsible  -Continue Ceftriaxone  4.. H/o RA:  -MTX on hold, On stress dose steroids due to chronic prednisone  -follow and taper down   Code Status: full Family Communication: wife at bedside Disposition Plan: to home when medically ready   Consultants:  NS  Procedures:  Exploration of lumbar wound with dural patch of dural defect- per Dr Hal Neer on 7/9   Antibiotics:  Vanc & Rocephin started on 7/7   HPI/Subjective: Denies any new c/o, still with back pain, states headaches less  Objective: Filed Vitals:   02/25/14 1755  BP: 133/52  Pulse: 94  Temp: 98.2 F (36.8 C)  Resp: 20   No intake or output data in the 24 hours ending 02/25/14 2041 Filed Weights   02/20/14 2022 02/21/14 0215  Weight: 117.935 kg (260 lb) 116.9 kg (257 lb 11.5 oz)    Exam:  General: alert & oriented x 3 In NAD Cardiovascular: RRR, nl S1 s2 Respiratory: CTAB Abdomen: soft +BS NT/ND, no masses palpable Back: lumbar dressing clean and dry Extremities: No cyanosis and no edema    Data Reviewed: Basic Metabolic  Panel:  Recent Labs Lab 02/20/14 2054 02/21/14 0425 02/23/14 0539 02/25/14 0459  NA 135* 132* 132* 132*  K 4.5 4.6 4.0 4.1  CL 95* 95* 94* 93*  CO2 26 24 25 25   GLUCOSE 129* 155* 141* 117*  BUN 11 11 15 9   CREATININE 0.73 0.64 0.69 0.78  CALCIUM 9.3 8.9 9.0 8.7   Liver Function Tests:  Recent Labs Lab 02/20/14 2054 02/21/14 0425  AST 23 19  ALT 69* 57*  ALKPHOS 116 104  BILITOT 0.4 0.3  PROT 7.6 6.8  ALBUMIN 3.2* 2.8*   No results found for this basename: LIPASE, AMYLASE,  in the last 168 hours No results found for this basename: AMMONIA,  in the last 168 hours CBC:  Recent Labs Lab 02/20/14 2054 02/21/14 0425 02/23/14 0539 02/25/14 0459  WBC 17.9* 19.9* 12.8* 16.6*  NEUTROABS 14.5*  --   --   --   HGB 12.8* 11.4* 10.7* 11.4*  HCT 38.2* 34.4* 32.5* 34.6*  MCV 87.6 87.8 86.4 86.3  PLT 426* 403* 445* 433*   Cardiac Enzymes: No results found for this basename: CKTOTAL, CKMB, CKMBINDEX, TROPONINI,  in the last 168 hours BNP (last 3 results) No results found for this basename: PROBNP,  in the last 8760 hours CBG: No results found for this basename: GLUCAP,  in the last 168 hours  Recent Results (from the past 240 hour(s))  URINE CULTURE     Status: None   Collection Time    02/20/14  11:07 PM      Result Value Ref Range Status   Specimen Description URINE, CLEAN CATCH   Final   Special Requests ADDED 0040 02/21/14   Final   Culture  Setup Time     Final   Value: 02/21/2014 03:58     Performed at [place]     Final   Value: >=100,000 COLONIES/ML     Performed at [facility]   Culture     Final   Value: ESCHERICHIA COLI     Performed at [facility]   Report Status 02/22/2014 FINAL   Final   Organism ID, Bacteria ESCHERICHIA COLI   Final  CULTURE, BLOOD (ROUTINE X 2)     Status: None   Collection Time    02/21/14  1:37 AM      Result Value Ref Range Status   Specimen Description BLOOD LEFT HAND   Final    Special Requests     Final   Value: BOTTLES DRAWN AEROBIC AND ANAEROBIC ANAEROBIC 6CC AEROBIC 12CC   Culture  Setup Time     Final   Value: 02/21/2014 08:34     Performed at [facility]   Culture     Final   Value:        BLOOD CULTURE RECEIVED NO GROWTH TO DATE CULTURE WILL BE HELD FOR 5 DAYS BEFORE ISSUING A FINAL NEGATIVE REPORT     Performed at [facility]   Report Status PENDING   Incomplete  CULTURE, BLOOD (ROUTINE X 2)     Status: None   Collection Time    02/21/14  1:37 AM      Result Value Ref Range Status   Specimen Description BLOOD RIGHT FOREARM   Final   Special Requests BOTTLES DRAWN AEROBIC AND ANAEROBIC 6CC EA   Final   Culture  Setup Time     Final   Value: 02/21/2014 08:34     Performed at [facility]   Culture     Final   Value:        BLOOD CULTURE RECEIVED NO GROWTH TO DATE CULTURE WILL BE HELD FOR 5 DAYS BEFORE ISSUING A FINAL NEGATIVE REPORT     Performed at [facility]   Report Status PENDING   Incomplete     Studies: No results found.  Scheduled Meds: . cefTRIAXone (ROCEPHIN)  IV  1 g Intravenous Q12H  . folic acid  1 mg Oral Daily  . heparin  5,000 Units Subcutaneous 3 times per day  . sodium chloride  3 mL Intravenous Q12H  . vancomycin  1,000 mg Intravenous Q8H   Continuous Infusions: . sodium chloride 75 mL/hr at 02/24/14 1929  . sodium chloride 250 mL (02/23/14 2315)  . lactated ringers 50 mL/hr at 02/23/14 1842    Principal Problem:   Headache(784.0) Active Problems:   UTI (lower urinary tract infection)   Nausea with vomiting   Rheumatoid arthritis   Nausea & vomiting    Time spent: [place] [facility] Pager [phone]. If 7PM-7AM, please contact night-coverage at [email or web address], password [facility] 02/25/2014, 8:41 PM  LOS: 5 days

## 2014-02-25 NOTE — Progress Notes (Signed)
Patient ID: Willie Howe, male   DOB: April 14, 1961, 53 y.o.   MRN: [id number] Doing well soreness in his back mild headache  Bandage dry strength out of 5  Continue flat bedrest

## 2014-02-25 NOTE — Progress Notes (Signed)
ANTIBIOTIC CONSULT NOTE - FOLLOW UP  Pharmacy Consult for vancomycin  Indication: r/o meningitis, CSF leak; E coli UTI  No Known Allergies  Patient Measurements: Height: 6\' 3"  (190.5 cm) Weight: 257 lb 11.5 oz (116.9 kg) IBW/kg (Calculated) : 84.5  Vital Signs: Temp: 97.1 F (36.2 C) (07/11 0945) Temp src: Oral (07/11 0945) BP: 95/40 mmHg (07/11 0945) Pulse Rate: 91 (07/11 0945) Intake/Output from previous day: 07/10 0701 - 07/11 0700 In: 900 [P.O.:900] Out: 500 [Urine:500] Intake/Output from this shift:    Labs:  Recent Labs  02/23/14 0539 02/25/14 0459  WBC 12.8* 16.6*  HGB 10.7* 11.4*  PLT 445* 433*  CREATININE 0.69 0.78   Estimated Creatinine Clearance: 145.6 ml/min (by C-G formula based on Cr of 0.78).  Recent Labs  02/24/14 2026 02/25/14 0756  VANCOTROUGH 41.7*  --   VANCORANDOM  --  9.3    Assessment: 53 y/o male who had lumbar surgery on 6/26 and was admitted 7/6 with fever and dizziness. He was found to have a CSF leak and is s/p lumbar wound exploration and repair of dural defect on 7/9. On day #6 vancomycin and ceftriaxone. Vancomycin level was 41.7 last night at 2026 which was actually a peak level since level was drawn 1 hr after dose was give. Random vancomycin level down to 9.3 this morning at 0756. Calculated t1/2 ~ 6 hrs. Renal function is stable. Tm 100.7, WBC are trending up again. Urine culture positive for E. coli and is sensitive to ceftriaxone.  Ctx 7/7>> Vanc 7/7>> 7/10 VP 41.7 @ 2026 (drawn 1 hr after after dose was give) 7/11 VT 9.3 @ 0756. (Calculated t1/2 ~ 6 hrs  )  BCx2 UCx - >100K E coli - pan sens x amp  Goal of Therapy:  Vancomycin trough level 15-20 mcg/ml  Plan:  - Restart Vancomycin 1000 mg IV q8h - Ceftriaxone 1 g IV q12h - Recheck true vancomycin trough after 3-5 doses.  - Monitor renal function and clinical progress  Maryanna Shape, PharmD, BCPS  Clinical Pharmacist  Pager: [phone]  02/25/2014 10:47 AM

## 2014-02-26 LAB — CBC
HCT: 32 % — ABNORMAL LOW (ref 39.0–52.0)
HEMOGLOBIN: 10.7 g/dL — AB (ref 13.0–17.0)
MCH: 28.8 pg (ref 26.0–34.0)
MCHC: 33.4 g/dL (ref 30.0–36.0)
MCV: 86.3 fL (ref 78.0–100.0)
PLATELETS: 457 10*3/uL — AB (ref 150–400)
RBC: 3.71 MIL/uL — ABNORMAL LOW (ref 4.22–5.81)
RDW: 13.8 % (ref 11.5–15.5)
WBC: 14 10*3/uL — ABNORMAL HIGH (ref 4.0–10.5)

## 2014-02-26 MED ORDER — HYDROMORPHONE HCL 2 MG PO TABS
4.0000 mg | ORAL_TABLET | ORAL | Status: DC | PRN
  Administered 2014-02-26 – 2014-02-27 (×3): 4 mg via ORAL
  Administered 2014-02-27 – 2014-03-03 (×19): 8 mg via ORAL
  Administered 2014-03-03: 4 mg via ORAL
  Administered 2014-03-04 – 2014-03-06 (×10): 8 mg via ORAL
  Administered 2014-03-06: 4 mg via ORAL
  Administered 2014-03-06 – 2014-03-07 (×6): 8 mg via ORAL
  Administered 2014-03-08: 4 mg via ORAL
  Administered 2014-03-08 – 2014-03-11 (×14): 8 mg via ORAL
  Filled 2014-02-26: qty 4
  Filled 2014-02-26: qty 2
  Filled 2014-02-26 (×16): qty 4
  Filled 2014-02-26: qty 2
  Filled 2014-02-26 (×7): qty 4
  Filled 2014-02-26: qty 2
  Filled 2014-02-26 (×9): qty 4
  Filled 2014-02-26: qty 2
  Filled 2014-02-26 (×12): qty 4
  Filled 2014-02-26: qty 2
  Filled 2014-02-26 (×6): qty 4

## 2014-02-26 NOTE — Progress Notes (Signed)
[facility] PROGRESS NOTE  Willie Howe BMW:[id number] DOB: 1960/11/26 DOA: 02/20/2014 PCP: PROVIDER NOT IN SYSTEM  Assessment/Plan:  1. CSF Leak, L3-4 on left -per myelogram, Likely etiology of the headaches - s/p Exploration of lumbar wound with dural patch of dural defect per Dr Hal Neer on 7/9 -per NS keep flat for probably about 1 week as the dural defect was rather sizable and unable to be closed primarily.  2.Headache/N/V/fever (102)  -leak above likely etiology of headaches, cannot rule out post op infection/meningitis  -Wound site appears unremarkable  -Ct head benign  -LP will not be easy given Lumbar surgery  - on empiric Abx (Vanc/Ceftriaxone), had been afebrile since admission but fever to 100.7 am 7/11,  Afebrile overnight -follow and discuss further empiric abx for CSF infection/meningitis with NS  3. E. Coli UTI  -had a catheter last admission, which may be responsible  -Continue Ceftriaxone  4.. H/o RA:  -MTX on hold, On stress dose steroids due to chronic prednisone  -follow and taper down   Code Status: full Family Communication: wife at bedside Disposition Plan: to home when medically ready   Consultants:  NS  Procedures:  Exploration of lumbar wound with dural patch of dural defect- per Dr Hal Neer on 7/9   Antibiotics:  Vanc & Rocephin started on 7/7   HPI/Subjective: states still with back pain, but headaches less  Objective: Filed Vitals:   02/26/14 1754  BP: 110/38  Pulse: 84  Temp: 98 F (36.7 C)  Resp: 16   No intake or output data in the 24 hours ending 02/26/14 1855 Filed Weights   02/20/14 2022 02/21/14 0215  Weight: 117.935 kg (260 lb) 116.9 kg (257 lb 11.5 oz)    Exam:  General: alert & oriented x 3 In NAD Cardiovascular: RRR, nl S1 s2 Respiratory: CTAB Abdomen: soft +BS NT/ND, no masses palpable Back: lumbar dressing clean and dry Extremities: No cyanosis and no edema    Data Reviewed: Basic Metabolic  Panel:  Recent Labs Lab 02/20/14 2054 02/21/14 0425 02/23/14 0539 02/25/14 0459  NA 135* 132* 132* 132*  K 4.5 4.6 4.0 4.1  CL 95* 95* 94* 93*  CO2 26 24 25 25   GLUCOSE 129* 155* 141* 117*  BUN 11 11 15 9   CREATININE 0.73 0.64 0.69 0.78  CALCIUM 9.3 8.9 9.0 8.7   Liver Function Tests:  Recent Labs Lab 02/20/14 2054 02/21/14 0425  AST 23 19  ALT 69* 57*  ALKPHOS 116 104  BILITOT 0.4 0.3  PROT 7.6 6.8  ALBUMIN 3.2* 2.8*   No results found for this basename: LIPASE, AMYLASE,  in the last 168 hours No results found for this basename: AMMONIA,  in the last 168 hours CBC:  Recent Labs Lab 02/20/14 2054 02/21/14 0425 02/23/14 0539 02/25/14 0459 02/26/14 0744  WBC 17.9* 19.9* 12.8* 16.6* 14.0*  NEUTROABS 14.5*  --   --   --   --   HGB 12.8* 11.4* 10.7* 11.4* 10.7*  HCT 38.2* 34.4* 32.5* 34.6* 32.0*  MCV 87.6 87.8 86.4 86.3 86.3  PLT 426* 403* 445* 433* 457*   Cardiac Enzymes: No results found for this basename: CKTOTAL, CKMB, CKMBINDEX, TROPONINI,  in the last 168 hours BNP (last 3 results) No results found for this basename: PROBNP,  in the last 8760 hours CBG: No results found for this basename: GLUCAP,  in the last 168 hours  Recent Results (from the past 240 hour(s))  URINE CULTURE  Status: None   Collection Time    02/20/14 11:07 PM      Result Value Ref Range Status   Specimen Description URINE, CLEAN CATCH   Final   Special Requests ADDED 0040 02/21/14   Final   Culture  Setup Time     Final   Value: 02/21/2014 03:58     Performed at [facility]     Final   Value: >=100,000 COLONIES/ML     Performed at [facility]   Culture     Final   Value: ESCHERICHIA COLI     Performed at [facility]   Report Status 02/22/2014 FINAL   Final   Organism ID, Bacteria ESCHERICHIA COLI   Final  CULTURE, BLOOD (ROUTINE X 2)     Status: None   Collection Time    02/21/14  1:37 AM      Result Value Ref Range Status    Specimen Description BLOOD LEFT HAND   Final   Special Requests     Final   Value: BOTTLES DRAWN AEROBIC AND ANAEROBIC ANAEROBIC 6CC AEROBIC 12CC   Culture  Setup Time     Final   Value: 02/21/2014 08:34     Performed at [facility]   Culture     Final   Value:        BLOOD CULTURE RECEIVED NO GROWTH TO DATE CULTURE WILL BE HELD FOR 5 DAYS BEFORE ISSUING A FINAL NEGATIVE REPORT     Performed at [facility]   Report Status PENDING   Incomplete  CULTURE, BLOOD (ROUTINE X 2)     Status: None   Collection Time    02/21/14  1:37 AM      Result Value Ref Range Status   Specimen Description BLOOD RIGHT FOREARM   Final   Special Requests BOTTLES DRAWN AEROBIC AND ANAEROBIC 6CC EA   Final   Culture  Setup Time     Final   Value: 02/21/2014 08:34     Performed at [facility]   Culture     Final   Value:        BLOOD CULTURE RECEIVED NO GROWTH TO DATE CULTURE WILL BE HELD FOR 5 DAYS BEFORE ISSUING A FINAL NEGATIVE REPORT     Performed at [facility]   Report Status PENDING   Incomplete     Studies: No results found.  Scheduled Meds: . cefTRIAXone (ROCEPHIN)  IV  1 g Intravenous Q12H  . folic acid  1 mg Oral Daily  . heparin  5,000 Units Subcutaneous 3 times per day  . sodium chloride  3 mL Intravenous Q12H  . vancomycin  1,000 mg Intravenous Q8H   Continuous Infusions: . sodium chloride 250 mL (02/23/14 2315)  . lactated ringers 50 mL/hr at 02/23/14 1842    Principal Problem:   Headache(784.0) Active Problems:   UTI (lower urinary tract infection)   Nausea with vomiting   Rheumatoid arthritis   Nausea & vomiting    Time spent: [place] [facility] Pager [phone]. If 7PM-7AM, please contact night-coverage at [email or web address], password [facility] 02/26/2014, 6:55 PM  LOS: 6 days

## 2014-02-26 NOTE — Progress Notes (Signed)
Filed Vitals:   02/25/14 2131 02/26/14 0102 02/26/14 0505 02/26/14 1000  BP: 139/89 125/72 115/64 106/46  Pulse: 100 95 87 91  Temp: 98.2 F (36.8 C) 99.5 F (37.5 C) 98.9 F (37.2 C) 98.1 F (36.7 C)  TempSrc: Oral Oral Oral Oral  Resp: 20 20 20 20   Height:      Weight:      SpO2: 97% 99% 96% 96%    CBC  Recent Labs  02/25/14 0459 02/26/14 0744  WBC 16.6* 14.0*  HGB 11.4* 10.7*  HCT 34.6* 32.0*  PLT 433* 457*   BMET  Recent Labs  02/25/14 0459  NA 132*  K 4.1  CL 93*  CO2 25  GLUCOSE 117*  BUN 9  CREATININE 0.78  CALCIUM 8.7    Patient continues on bedrest, with head of bed flat. Describes mild headache but overall doing well. Dressing clean and dry, intact.  Plan: To continue on bedrest for several more days. To be seen by Dr. Hal Neer tomorrow.  Hosie Spangle, MD 02/26/2014, 10:12 AM

## 2014-02-26 NOTE — Progress Notes (Signed)
Entered room to administer IV vancomycin; delay in hanging IV dose from 12noon; patient's IV access leaking; IV team notified and pharmacy notified of loss of IV access; time will be readjusted.

## 2014-02-27 LAB — CULTURE, BLOOD (ROUTINE X 2)
CULTURE: NO GROWTH
CULTURE: NO GROWTH

## 2014-02-27 MED ORDER — DEXTROSE 5 % IV SOLN
2.0000 g | Freq: Two times a day (BID) | INTRAVENOUS | Status: DC
  Administered 2014-02-27 – 2014-02-28 (×4): 2 g via INTRAVENOUS
  Filled 2014-02-27 (×8): qty 2

## 2014-02-27 NOTE — Progress Notes (Signed)
UR COMPLETED  

## 2014-02-27 NOTE — Progress Notes (Signed)
Patient ID: Willie Howe, male   DOB: 1961-08-12, 53 y.o.   MRN: [id number] Afeb, vss No new neuro issues. Dressing removed and wound looks excellent with no drainage or swelling. Will keep flat for 5 more days and then start to increase activity.  No other issues noted at this time.

## 2014-02-27 NOTE — Progress Notes (Signed)
[facility] PROGRESS NOTE  Willie Howe HKV:[id number] DOB: Jul 05, 1961 DOA: 02/20/2014 PCP: PROVIDER NOT IN SYSTEM  Assessment/Plan:  1. CSF Leak, L3-4 on left -per myelogram, Likely etiology of the headaches -s/p Exploration of lumbar wound with dural patch of dural defect per Dr Hal Neer on 7/9 -per NS keep flat for probably about 1 week(from 7/9)as the dural defect was rather sizable and unable to be closed primarily. -Pt remains stable, will discuss transferring pt to Dr Yevonne Aline service in am  2.Headache/N/V/fever (102)  -leak above likely etiology of headaches, cannot rule out post op infection/meningitis  -Wound site appears unremarkable  -Ct head benign  -LP will not be easy given Lumbar surgery  - on empiric Abx (Vanc/Ceftriaxone), had been afebrile since admission but fever to 100.7 am 7/11,  Afebrile overnight -follow and discuss further empiric abx for CSF infection/meningitis with NS  3. E. Coli UTI  -had a catheter last admission, which may be responsible  -Continue Ceftriaxone>> treat for total of 7days  4.. H/o RA:  -MTX on hold, On stress dose steroids due to chronic prednisone  -follow and taper down   Code Status: full Family Communication: wife at bedside Disposition Plan: to home when medically ready   Consultants:  NS  Procedures:  Exploration of lumbar wound with dural patch of dural defect- per Dr Hal Neer on 7/9   Antibiotics:  Vanc & Rocephin started on 7/7   HPI/Subjective: No new complaints  Objective: Filed Vitals:   02/27/14 1752  BP: 116/58  Pulse: 99  Temp: 97.9 F (36.6 C)  Resp: 20    Intake/Output Summary (Last 24 hours) at 02/27/14 1946 Last data filed at 02/27/14 1700  Gross per 24 hour  Intake    880 ml  Output   1200 ml  Net   -320 ml   Filed Weights   02/20/14 2022 02/21/14 0215  Weight: 117.935 kg (260 lb) 116.9 kg (257 lb 11.5 oz)    Exam:  General: alert & oriented x 3 In NAD Cardiovascular: RRR,  nl S1 s2 Respiratory: CTAB Abdomen: soft +BS NT/ND, no masses palpable Back: lumbar dressing clean and dry Extremities: No cyanosis and no edema    Data Reviewed: Basic Metabolic Panel:  Recent Labs Lab 02/20/14 2054 02/21/14 0425 02/23/14 0539 02/25/14 0459  NA 135* 132* 132* 132*  K 4.5 4.6 4.0 4.1  CL 95* 95* 94* 93*  CO2 26 24 25 25   GLUCOSE 129* 155* 141* 117*  BUN 11 11 15 9   CREATININE 0.73 0.64 0.69 0.78  CALCIUM 9.3 8.9 9.0 8.7   Liver Function Tests:  Recent Labs Lab 02/20/14 2054 02/21/14 0425  AST 23 19  ALT 69* 57*  ALKPHOS 116 104  BILITOT 0.4 0.3  PROT 7.6 6.8  ALBUMIN 3.2* 2.8*   No results found for this basename: LIPASE, AMYLASE,  in the last 168 hours No results found for this basename: AMMONIA,  in the last 168 hours CBC:  Recent Labs Lab 02/20/14 2054 02/21/14 0425 02/23/14 0539 02/25/14 0459 02/26/14 0744  WBC 17.9* 19.9* 12.8* 16.6* 14.0*  NEUTROABS 14.5*  --   --   --   --   HGB 12.8* 11.4* 10.7* 11.4* 10.7*  HCT 38.2* 34.4* 32.5* 34.6* 32.0*  MCV 87.6 87.8 86.4 86.3 86.3  PLT 426* 403* 445* 433* 457*   Cardiac Enzymes: No results found for this basename: CKTOTAL, CKMB, CKMBINDEX, TROPONINI,  in the last 168 hours BNP (last 3 results) No  results found for this basename: PROBNP,  in the last 8760 hours CBG: No results found for this basename: GLUCAP,  in the last 168 hours  Recent Results (from the past 240 hour(s))  URINE CULTURE     Status: None   Collection Time    02/20/14 11:07 PM      Result Value Ref Range Status   Specimen Description URINE, CLEAN CATCH   Final   Special Requests ADDED 0040 02/21/14   Final   Culture  Setup Time     Final   Value: 02/21/2014 03:58     Performed at [facility]     Final   Value: >=100,000 COLONIES/ML     Performed at [facility]   Culture     Final   Value: ESCHERICHIA COLI     Performed at [facility]   Report Status 02/22/2014 FINAL    Final   Organism ID, Bacteria ESCHERICHIA COLI   Final  CULTURE, BLOOD (ROUTINE X 2)     Status: None   Collection Time    02/21/14  1:37 AM      Result Value Ref Range Status   Specimen Description BLOOD LEFT HAND   Final   Special Requests     Final   Value: BOTTLES DRAWN AEROBIC AND ANAEROBIC ANAEROBIC 6CC AEROBIC 12CC   Culture  Setup Time     Final   Value: 02/21/2014 08:34     Performed at [facility]   Culture     Final   Value: NO GROWTH 5 DAYS     Performed at [facility]   Report Status 02/27/2014 FINAL   Final  CULTURE, BLOOD (ROUTINE X 2)     Status: None   Collection Time    02/21/14  1:37 AM      Result Value Ref Range Status   Specimen Description BLOOD RIGHT FOREARM   Final   Special Requests BOTTLES DRAWN AEROBIC AND ANAEROBIC 6CC EA   Final   Culture  Setup Time     Final   Value: 02/21/2014 08:34     Performed at [facility]   Culture     Final   Value: NO GROWTH 5 DAYS     Performed at [facility]   Report Status 02/27/2014 FINAL   Final     Studies: No results found.  Scheduled Meds: . cefTRIAXone (ROCEPHIN)  IV  2 g Intravenous Q12H  . folic acid  1 mg Oral Daily  . heparin  5,000 Units Subcutaneous 3 times per day  . sodium chloride  3 mL Intravenous Q12H  . vancomycin  1,000 mg Intravenous Q8H   Continuous Infusions: . sodium chloride 250 mL (02/23/14 2315)  . lactated ringers 50 mL/hr at 02/23/14 1842    Principal Problem:   Headache(784.0) Active Problems:   UTI (lower urinary tract infection)   Nausea with vomiting   Rheumatoid arthritis   Nausea & vomiting    Time spent: [place] [facility] Pager [phone]. If 7PM-7AM, please contact night-coverage at [email or web address], password [email or web address] 02/27/2014, 7:46 PM  LOS: 7 days

## 2014-02-27 NOTE — Progress Notes (Addendum)
ANTIBIOTIC CONSULT NOTE - FOLLOW UP  Pharmacy Consult:  Vancomycin / Rocephin Indication:  E.coli UTI + rule out meningitis  No Known Allergies  Patient Measurements: Height: 6\' 3"  (190.5 cm) Weight: 257 lb 11.5 oz (116.9 kg) IBW/kg (Calculated) : 84.5  Vital Signs: Temp: 97.8 F (36.6 C) (07/13 0950) Temp src: Oral (07/13 0950) BP: 113/45 mmHg (07/13 0950) Pulse Rate: 86 (07/13 0950)  Labs:  Recent Labs  02/25/14 0459 02/26/14 0744  WBC 16.6* 14.0*  HGB 11.4* 10.7*  PLT 433* 457*  CREATININE 0.78  --    Estimated Creatinine Clearance: 145.6 ml/min (by C-G formula based on Cr of 0.78).  Recent Labs  02/24/14 2026 02/25/14 0756  VANCOTROUGH 41.7*  --   VANCORANDOM  --  9.3     Microbiology: Recent Results (from the past 720 hour(s))  SURGICAL PCR SCREEN     Status: None   Collection Time    02/02/14 10:27 AM      Result Value Ref Range Status   MRSA, PCR NEGATIVE  NEGATIVE Final   Staphylococcus aureus NEGATIVE  NEGATIVE Final   Comment:            The Xpert SA Assay (FDA     approved for NASAL specimens     in patients over 73 years of age),     is one component of     a comprehensive surveillance     program.  Test performance has     been validated by [employer] for patients greater     than or equal to 14 year old.     It is not intended     to diagnose infection nor to     guide or monitor treatment.  URINE CULTURE     Status: None   Collection Time    02/20/14 11:07 PM      Result Value Ref Range Status   Specimen Description URINE, CLEAN CATCH   Final   Special Requests ADDED 0040 02/21/14   Final   Culture  Setup Time     Final   Value: 02/21/2014 03:58     Performed at [facility]     Final   Value: >=100,000 COLONIES/ML     Performed at [facility]   Culture     Final   Value: ESCHERICHIA COLI     Performed at [facility]   Report Status 02/22/2014 FINAL   Final   Organism ID, Bacteria  ESCHERICHIA COLI   Final  CULTURE, BLOOD (ROUTINE X 2)     Status: None   Collection Time    02/21/14  1:37 AM      Result Value Ref Range Status   Specimen Description BLOOD LEFT HAND   Final   Special Requests     Final   Value: BOTTLES DRAWN AEROBIC AND ANAEROBIC ANAEROBIC 6CC AEROBIC 12CC   Culture  Setup Time     Final   Value: 02/21/2014 08:34     Performed at [facility]   Culture     Final   Value: NO GROWTH 5 DAYS     Performed at [facility]   Report Status 02/27/2014 FINAL   Final  CULTURE, BLOOD (ROUTINE X 2)     Status: None   Collection Time    02/21/14  1:37 AM      Result Value Ref Range Status  Specimen Description BLOOD RIGHT FOREARM   Final   Special Requests BOTTLES DRAWN AEROBIC AND ANAEROBIC 6CC EA   Final   Culture  Setup Time     Final   Value: 02/21/2014 08:34     Performed at [facility]   Culture     Final   Value: NO GROWTH 5 DAYS     Performed at [facility]   Report Status 02/27/2014 FINAL   Final      Assessment: 53 YOM recently admitted for lumbar spinal stenosis, had lumbar surgery 02/10/14, and developed a fever of 102 degrees with increased dizziness, headache, nausea and vomiting.  Patient continues on vancomycin and Rocephin for possible meningitis and UTI.  Patient's renal function remains stable.  CTX 7/7 >> Vanc 7/7 >>  7/10 VP = 41.7 mcg/mL (drawn 1 hr after after dose was give) 7/11 VT = 9.3 mcg/mL (Calculated t1/2 ~ 6 hrs  )  BCx2 - negative UCx - >100K E coli (pan sens except Amp)   Goal of Therapy:  Vancomycin trough level 15-20 mcg/ml   Plan:  - Continue Vanc 1gm IV Q8H - Change Rocephin to 2gm IV Q12H until meningitis is definitely ruled out - Monitor renal fxn, clinical course, repeat vanc trough - BMET and vanc trough in AM - If still concerned with meningitis, consider adding ampicillin    Reign Bartnick D. Mina Marble, PharmD, BCPS Pager:  [phone] 02/27/2014, 10:22 AM

## 2014-02-28 LAB — BASIC METABOLIC PANEL
Anion gap: 12 (ref 5–15)
BUN: 7 mg/dL (ref 6–23)
CO2: 26 mEq/L (ref 19–32)
CREATININE: 0.73 mg/dL (ref 0.50–1.35)
Calcium: 9 mg/dL (ref 8.4–10.5)
Chloride: 95 mEq/L — ABNORMAL LOW (ref 96–112)
Glucose, Bld: 117 mg/dL — ABNORMAL HIGH (ref 70–99)
POTASSIUM: 4.2 meq/L (ref 3.7–5.3)
Sodium: 133 mEq/L — ABNORMAL LOW (ref 137–147)

## 2014-02-28 LAB — VANCOMYCIN, TROUGH: Vancomycin Tr: 13.8 ug/mL (ref 10.0–20.0)

## 2014-02-28 MED ORDER — DOCUSATE SODIUM 100 MG PO CAPS
100.0000 mg | ORAL_CAPSULE | Freq: Every day | ORAL | Status: DC
  Administered 2014-02-28 – 2014-03-11 (×11): 100 mg via ORAL
  Filled 2014-02-28 (×12): qty 1

## 2014-02-28 MED ORDER — PREDNISONE 5 MG PO TABS
5.0000 mg | ORAL_TABLET | Freq: Every day | ORAL | Status: DC
  Administered 2014-02-28 – 2014-03-11 (×12): 5 mg via ORAL
  Filled 2014-02-28 (×13): qty 1

## 2014-02-28 MED ORDER — VANCOMYCIN HCL 10 G IV SOLR
1250.0000 mg | Freq: Three times a day (TID) | INTRAVENOUS | Status: DC
  Administered 2014-02-28 – 2014-03-01 (×3): 1250 mg via INTRAVENOUS
  Filled 2014-02-28 (×5): qty 1250

## 2014-02-28 MED ORDER — SENNA 8.6 MG PO TABS
1.0000 | ORAL_TABLET | Freq: Every evening | ORAL | Status: DC | PRN
  Filled 2014-02-28: qty 1

## 2014-02-28 NOTE — Progress Notes (Signed)
Patient ID: Willie Howe, male   DOB: 1960/10/14, 53 y.o.   MRN: [id number] Afeb, vss. No new neuro issues. No headaches. Will keep flat for 4 more days. He seems to be doing very well dealing with the situation. Dressing totally clean and dry.

## 2014-02-28 NOTE — Progress Notes (Signed)
[facility] PROGRESS NOTE  Willie Howe PTE:[id number] DOB: 03/05/1961 DOA: 02/20/2014 PCP: PROVIDER NOT IN SYSTEM Discussed patient with Dr. Hal Neer, he remains medically stable and needs to keep that on his back for 4 more days. his maintenance dose prednisone he has been restarted at 5 mg daily.will transfer to Dr. Sande Rives service-appreciate the assistance- please call as needed.   [place] [facility] Pager [phone]. If 7PM-7AM, please contact night-coverage at [email or web address], password [facility] 02/28/2014, 2:30 PM  LOS: 8 days

## 2014-02-28 NOTE — Progress Notes (Signed)
ANTIBIOTIC CONSULT NOTE - FOLLOW UP  Pharmacy Consult:  Vancomycin / Rocephin Indication:  E.coli UTI + rule out meningitis  No Known Allergies  Patient Measurements: Height: 6\' 3"  (190.5 cm) Weight: 257 lb 11.5 oz (116.9 kg) IBW/kg (Calculated) : 84.5  Vital Signs: Temp: 98.4 F (36.9 C) (07/14 0952) Temp src: Oral (07/14 0952) BP: 114/64 mmHg (07/14 0952) Pulse Rate: 97 (07/14 0952)  Labs:  Recent Labs  02/26/14 0744 02/28/14 0759  WBC 14.0*  --   HGB 10.7*  --   PLT 457*  --   CREATININE  --  0.73   Estimated Creatinine Clearance: 145.6 ml/min (by C-G formula based on Cr of 0.73).  Recent Labs  02/28/14 0759  VANCOTROUGH 13.8     Microbiology: Recent Results (from the past 720 hour(s))  SURGICAL PCR SCREEN     Status: None   Collection Time    02/02/14 10:27 AM      Result Value Ref Range Status   MRSA, PCR NEGATIVE  NEGATIVE Final   Staphylococcus aureus NEGATIVE  NEGATIVE Final   Comment:            The Xpert SA Assay (FDA     approved for NASAL specimens     in patients over 89 years of age),     is one component of     a comprehensive surveillance     program.  Test performance has     been validated by [employer] for patients greater     than or equal to 82 year old.     It is not intended     to diagnose infection nor to     guide or monitor treatment.  URINE CULTURE     Status: None   Collection Time    02/20/14 11:07 PM      Result Value Ref Range Status   Specimen Description URINE, CLEAN CATCH   Final   Special Requests ADDED 0040 02/21/14   Final   Culture  Setup Time     Final   Value: 02/21/2014 03:58     Performed at [facility]     Final   Value: >=100,000 COLONIES/ML     Performed at [facility]   Culture     Final   Value: ESCHERICHIA COLI     Performed at [facility]   Report Status 02/22/2014 FINAL   Final   Organism ID, Bacteria ESCHERICHIA COLI   Final  CULTURE, BLOOD  (ROUTINE X 2)     Status: None   Collection Time    02/21/14  1:37 AM      Result Value Ref Range Status   Specimen Description BLOOD LEFT HAND   Final   Special Requests     Final   Value: BOTTLES DRAWN AEROBIC AND ANAEROBIC ANAEROBIC 6CC AEROBIC 12CC   Culture  Setup Time     Final   Value: 02/21/2014 08:34     Performed at [facility]   Culture     Final   Value: NO GROWTH 5 DAYS     Performed at [facility]   Report Status 02/27/2014 FINAL   Final  CULTURE, BLOOD (ROUTINE X 2)     Status: None   Collection Time    02/21/14  1:37 AM      Result Value Ref Range Status   Specimen Description BLOOD RIGHT FOREARM  Final   Special Requests BOTTLES DRAWN AEROBIC AND ANAEROBIC 6CC EA   Final   Culture  Setup Time     Final   Value: 02/21/2014 08:34     Performed at [facility]   Culture     Final   Value: NO GROWTH 5 DAYS     Performed at [facility]   Report Status 02/27/2014 FINAL   Final      Assessment: 53 YOM recently admitted for lumbar spinal stenosis, had lumbar surgery 02/10/14, and developed a fever of 102 degrees with increased dizziness, headache, nausea and vomiting.  Patient continues on vancomycin and Rocephin for possible meningitis and UTI.  Patient's renal function remains stable and his vancomycin trough as accumulated to near therapeutic level.  CTX 7/7 >> Vanc 7/7 >>  7/10 VP = 41.7 mcg/mL (drawn 1 hr after after dose was give) 7/11 VT = 9.3 mcg/mL (Calculated t1/2 ~ 6 hrs  ) 7/14 VT = 13.8 mcg/mL on 1gm q8h >> increase to 1250mg  q8h  BCx2 - negative UCx - >100K E coli (pan sens except Amp)   Goal of Therapy:  Vancomycin trough level 15-20 mcg/ml   Plan:  - Increase vanc to 1250mg  IV Q8H - Rocephin 2gm IV Q12H until meningitis is definitely ruled out - Monitor renal fxn, clinical course, PRN vanc trough - If still concerned with meningitis, consider adding ampicillin    Louvina Cleary D. Mina Marble, PharmD, BCPS Pager:   [phone] 02/28/2014, 1:16 PM

## 2014-02-28 NOTE — Progress Notes (Signed)
Pt L FA IV infiltrated this am, Vanc running.  Site firm and sore, but not burning.  Pt educated to advise RN if burning occurs.  New IV site and tubing, fluids, and AntiBx running currently.  Vanc re-ordered from pharmacy.

## 2014-03-01 NOTE — Progress Notes (Signed)
Patient ID: Willie Howe, male   DOB: 03/06/1961, 53 y.o.   MRN: [id number] Afeb, vss. No new neuro issues. Dressing clean and dry. Denies headaches. Will keep flat for 3 more days, and then start to slowly increase activity.

## 2014-03-02 NOTE — Progress Notes (Signed)
Patient ID: Willie Howe, male   DOB: 1961-08-03, 53 y.o.   MRN: [id number] Afeb, vss. No new neuro issues. Bandage changed and wound looks excellent. No headaches. Will keep flat for 2 more days, and then start to slowly increase activity.

## 2014-03-03 NOTE — Progress Notes (Signed)
Patient ID: Willie Howe, male   DOB: [birth year], 53 y.o.   MRN: [id number] Afeb, vss. No new neuro issues. Wound clean and dry; Will start to increase activity tomorrow SLOWLY. Will see how he tolerates it and how the wound etc does.

## 2014-03-04 NOTE — Progress Notes (Signed)
Pt was moved into a 30 degree sitting position at 0630 for 10 mins, then to a full sitting position for another 15 mins, all without any dizziness. Pt was then walked to the bathroom with a walker and his brace on without any problems. Presently sitting up in the chair and having breakfast. Will continue to monitor.

## 2014-03-04 NOTE — Progress Notes (Signed)
Patient ID: Willie Howe, male   DOB: Aug 19, 1960, 53 y.o.   MRN: [id number] Subjective: Patient reports that Willie Howe is doing well. No headaches. No back or leg pain. No numbness tingling or weakness.  Objective: Vital signs in last 24 hours: Temp:  [97.9 F (36.6 C)-99 F (37.2 C)] 98.4 F (36.9 C) (07/18 1000) Pulse Rate:  [85-93] 93 (07/18 1000) Resp:  [18] 18 (07/18 1000) BP: (110-129)/(63-79) 118/73 mmHg (07/18 1000) SpO2:  [97 %-100 %] 100 % (07/18 1000)  Intake/Output from previous day: 07/17 0701 - 07/18 0700 In: -  Out: 700 [Urine:700] Intake/Output this shift:    Neurologic: Grossly normal, wound is clean and dry  Lab Results: Lab Results  Component Value Date   WBC 14.0* 02/26/2014   HGB 10.7* 02/26/2014   HCT 32.0* 02/26/2014   MCV 86.3 02/26/2014   PLT 457* 02/26/2014   Lab Results  Component Value Date   INR 1.11 10/09/2009   BMET Lab Results  Component Value Date   NA 133* 02/28/2014   K 4.2 02/28/2014   CL 95* 02/28/2014   CO2 26 02/28/2014   GLUCOSE 117* 02/28/2014   BUN 7 02/28/2014   CREATININE 0.73 02/28/2014   CALCIUM 9.0 02/28/2014    Studies/Results: No results found.  Assessment/Plan: Doing well after mobilization. I would like him to continue to mobilize over the next day or 2   LOS: 12 days    Zeke Aker S 03/04/2014, 1:20 PM

## 2014-03-05 ENCOUNTER — Other Ambulatory Visit: Payer: Self-pay

## 2014-03-05 ENCOUNTER — Inpatient Hospital Stay (HOSPITAL_COMMUNITY): Payer: BC Managed Care – PPO

## 2014-03-05 DIAGNOSIS — I379 Nonrheumatic pulmonary valve disorder, unspecified: Secondary | ICD-10-CM

## 2014-03-05 DIAGNOSIS — I2699 Other pulmonary embolism without acute cor pulmonale: Secondary | ICD-10-CM

## 2014-03-05 DIAGNOSIS — I4891 Unspecified atrial fibrillation: Secondary | ICD-10-CM

## 2014-03-05 DIAGNOSIS — R55 Syncope and collapse: Secondary | ICD-10-CM

## 2014-03-05 DIAGNOSIS — J9600 Acute respiratory failure, unspecified whether with hypoxia or hypercapnia: Secondary | ICD-10-CM

## 2014-03-05 LAB — BLOOD GAS, ARTERIAL
Acid-base deficit: 1.1 mmol/L (ref 0.0–2.0)
Bicarbonate: 22.8 mEq/L (ref 20.0–24.0)
Drawn by: 277331
FIO2: 1 %
O2 Saturation: 96.2 %
PCO2 ART: 35.6 mmHg (ref 35.0–45.0)
PO2 ART: 83.2 mmHg (ref 80.0–100.0)
Patient temperature: 97.6
TCO2: 24 mmol/L (ref 0–100)
pH, Arterial: 7.42 (ref 7.350–7.450)

## 2014-03-05 LAB — MAGNESIUM: Magnesium: 2.2 mg/dL (ref 1.5–2.5)

## 2014-03-05 LAB — CBC
HEMATOCRIT: 34.5 % — AB (ref 39.0–52.0)
HEMOGLOBIN: 11.1 g/dL — AB (ref 13.0–17.0)
MCH: 28.3 pg (ref 26.0–34.0)
MCHC: 32.2 g/dL (ref 30.0–36.0)
MCV: 88 fL (ref 78.0–100.0)
Platelets: 310 10*3/uL (ref 150–400)
RBC: 3.92 MIL/uL — ABNORMAL LOW (ref 4.22–5.81)
RDW: 14.1 % (ref 11.5–15.5)
WBC: 14.1 10*3/uL — ABNORMAL HIGH (ref 4.0–10.5)

## 2014-03-05 LAB — TROPONIN I: TROPONIN I: 0.7 ng/mL — AB (ref <0.30)

## 2014-03-05 LAB — APTT: APTT: 31 s (ref 24–37)

## 2014-03-05 LAB — PROTIME-INR
INR: 1.05 (ref 0.00–1.49)
Prothrombin Time: 13.7 seconds (ref 11.6–15.2)

## 2014-03-05 LAB — FIBRINOGEN: Fibrinogen: 545 mg/dL — ABNORMAL HIGH (ref 204–475)

## 2014-03-05 LAB — MRSA PCR SCREENING: MRSA BY PCR: NEGATIVE

## 2014-03-05 MED ORDER — AMIODARONE HCL IN DEXTROSE 360-4.14 MG/200ML-% IV SOLN
60.0000 mg/h | INTRAVENOUS | Status: DC
Start: 2014-03-05 — End: 2014-03-05
  Filled 2014-03-05: qty 200

## 2014-03-05 MED ORDER — IOHEXOL 350 MG/ML SOLN
80.0000 mL | Freq: Once | INTRAVENOUS | Status: AC | PRN
  Administered 2014-03-05: 80 mL via INTRAVENOUS

## 2014-03-05 MED ORDER — AMIODARONE LOAD VIA INFUSION
150.0000 mg | Freq: Once | INTRAVENOUS | Status: DC
  Filled 2014-03-05: qty 83.34

## 2014-03-05 MED ORDER — SODIUM CHLORIDE 0.9 % IV SOLN
INTRAVENOUS | Status: DC
  Administered 2014-03-05 – 2014-03-08 (×4): via INTRAVENOUS

## 2014-03-05 MED ORDER — SODIUM CHLORIDE 0.9 % IV BOLUS (SEPSIS)
500.0000 mL | Freq: Once | INTRAVENOUS | Status: AC
  Administered 2014-03-05: 500 mL via INTRAVENOUS

## 2014-03-05 MED ORDER — HEPARIN (PORCINE) IN NACL 100-0.45 UNIT/ML-% IJ SOLN
1900.0000 [IU]/h | INTRAMUSCULAR | Status: DC
  Administered 2014-03-05: 1600 [IU]/h via INTRAVENOUS
  Administered 2014-03-06 – 2014-03-07 (×3): 1800 [IU]/h via INTRAVENOUS
  Administered 2014-03-08 – 2014-03-11 (×5): 1900 [IU]/h via INTRAVENOUS
  Filled 2014-03-05 (×12): qty 250

## 2014-03-05 MED ORDER — BIOTENE DRY MOUTH MT LIQD
15.0000 mL | Freq: Two times a day (BID) | OROMUCOSAL | Status: DC
  Administered 2014-03-06 – 2014-03-08 (×5): 15 mL via OROMUCOSAL

## 2014-03-05 MED ORDER — AMIODARONE HCL IN DEXTROSE 360-4.14 MG/200ML-% IV SOLN
30.0000 mg/h | INTRAVENOUS | Status: DC
  Filled 2014-03-05: qty 200

## 2014-03-05 MED ORDER — METOPROLOL TARTRATE 1 MG/ML IV SOLN
INTRAVENOUS | Status: AC
  Filled 2014-03-05: qty 5

## 2014-03-05 NOTE — Significant Event (Signed)
Rapid Response Event Note  Overview:  Called by Rn for patient on floor with SOB Time Called: 1601 Arrival Time: 1603   Central chest pain Initial Focused Assessment:  Upon my arrival to patients room, RN Family and Dr. Ellene Route at bedside.  Patient was lying on floor in bathroom, patient pale, diaphoretic, RR 36-40, C/o central chest pain does not radiate, states it is tightness, SOB, states he can not catch his breath.  Patient states that while in the bathroom standing at sink he became dizzy, lightheaded, diaphoretic and his legs were giving out.  Patient was assisted to the floor by the NT that was in the room.   Interventions:  Patient assisted back to bed with hoyer lift, sBP 88, placed on tele box HR all over 160-180's, spo2 86%-placed on NRB.  Ns bolus given.  EKG done, however hard to obtain due to patient being so diaphoretic.  2.5 mg IV lopressor given as per MD order.  SBP 115/60 HR 120's now.  Patients color is better, states his breathing is better and still has some chest tightness.  Patient transported to 3M10 via bed, with monitor AND 100% oxygen via NRB.    Event Summary:  On arrival to ICU, Dr. Earnest Conroy at bedside, Rn to call if assistance needed   at      at          [facility]

## 2014-03-05 NOTE — Progress Notes (Signed)
eLink Physician-Brief Progress Note Patient Name: Willie Howe DOB: 21-Dec-1960 MRN: [id number]  Date of Service  03/05/2014   HPI/Events of Note   Spoke to Dr. Kathlene Cote of [place]. Patient may be a candidate for catheter-directed TPA. There is some indication of right heart strain on the CT. He would like an echocardiogram to confirm. Discussed with Dr. Claiborne Billings of cardiology who will call the echo tech to arrange.    eICU Interventions   F/u echocardiogram and discuss with Dr. Kathlene Cote.   Intervention Category Major Interventions: Respiratory failure - evaluation and management  Willie Eriksson R. 03/05/2014, 6:38 PM

## 2014-03-05 NOTE — Progress Notes (Addendum)
Patient ID: Willie Howe, male   DOB: 06-06-1961, 53 y.o.   MRN: [id number] Alert oriented. [name] is feeling well.  His incision and dressing are clean and dry. He is here to go home but was told that he would be discharged tomorrow.

## 2014-03-05 NOTE — Progress Notes (Signed)
Patient was being assisted to the commode by the NT.  Patient tolerated fine until he began to walk back to bed.  He began feeling lightheaded and weak and asked the NT to hold onto him. His legs began to buckle and the NT supported his weight with his knee and assisted the patient to the floor.  Patient was sweating profusely and gasping for breath.  Rapid response was called and Dr. Ellene Route came into the room.  Patient was transferred to 3MW10.

## 2014-03-05 NOTE — Progress Notes (Signed)
Brief Preliminary Cardiology Echo Read   Cardiology asked to perform echocardiogram for [name] with known PE by CT to assess for RV strain. The sonographer on call came in urgently. Bedside read Preserved LV function (LVEF > 55%). RV moderately enlarged/dilated with mild RV dysfunction/hypokinesis concerning for RV pressure overload with septal flattening on short axis views. Mild TR with elevated PA pressures. IVC with minimal respiratory variation. Overall clinical picture consistent with RV strain. Findings communicated to Pulmonary Critical Care team and patient and patient's wife at bedside. Official final read in AM.   Jules Husbands, MD

## 2014-03-05 NOTE — Progress Notes (Signed)
Was the fall witnessed:yes  Patient condition before and after the fall: before patient was stable-after patient was unstable  Patient's reaction to the fall: Patient was confused and lightheaded  Name of the doctor that was notified including date and time: Dr. Ellene Route  Any interventions and vital signs: Rapid response and respiratory were called.  Vital signs were 86/48 o2 83%

## 2014-03-05 NOTE — Progress Notes (Signed)
Patient ID: Willie Howe, male   DOB: 11-Jun-1961, 53 y.o.   MRN: [id number] Called to the patient's room because of a fall as he is leaving the bathroom. Patient apparently became lightheaded and was eased to the ground. Complaining of chest pain and being short of breath. Is very diaphoretic. Initial vital signs revealed a blood pressure of 80 his heart rate appeared to be over 150. Initial EKG demonstrates what appears to be either supraventricular tachycardia or atrial fibrillation. Patient's chest tightness persisted he was started on nonrebreather oxygen. Disease his symptoms. He was given 2 nebs milligrams of Lopressor while being prepared for transportation to the intensive care unit.

## 2014-03-05 NOTE — Progress Notes (Signed)
  Echocardiogram 2D Echocardiogram has been performed.  Willie Howe 03/05/2014, 7:55 PM

## 2014-03-05 NOTE — Progress Notes (Signed)
eLink Physician-Brief Progress Note Patient Name: Willie Howe DOB: 1961-06-07 MRN: [id number]  Date of Service  03/05/2014   HPI/Events of Note   Discussed echo with Cardiology.   eICU Interventions   Results discussed with Dr. Kathlene Cote. He will come in and assess patient. NPO for now.    Intervention Category Major Interventions: Acute renal failure - evaluation and management  Cleotis Sparr R. 03/05/2014, 8:02 PM

## 2014-03-05 NOTE — Consult Note (Signed)
Reason for Consult:  Acute submassive PE with right heart strain.  PE thrombolysis. Referring Physician: Davy Westmoreland is an 53 y.o. male.  HPI: History of lumbar surgeries with lumbar fusion end of June and more recent repair of large lumbar dural defect/CSF leak by Dr. Hal Neer on 6/9 with dural patch.  Had acute syncopal even with respiratory distress today around 4 PM.  CTA shows submassive bilateral PE with saddle clot and right heart strain with RV/LV ratio of 1.4.  Echo tonight confirms evidence of right heart strain and elevated PA pressures.  Patient was on nonrebreather mask earlier with hypotension and tachycardia.  Now O2 requirement down to 4L by N/C.  Hemodynamically stable.  Past Medical History  Diagnosis Date  . Arthritis   . Thyroid disease     Graves Disease  . Previous back surgery     x 3  . Kidney stone   . Dysrhythmia   . Anginal pain     Past Surgical History  Procedure Laterality Date  . Knee surgery      x 2  . Nephrolithotomy    . Back surgery    . Colonoscopy    . Lumbar wound debridement N/A 02/23/2014    Procedure: LUMBAR WOUND DEBRIDEMENT;  Surgeon: Faythe Ghee, MD;  Location: [place];  Service: Neurosurgery;  Laterality: N/A;  exploration lumbar wound. repair of dural defect.    Family History  Problem Relation Age of Onset  . CAD Sister     Social History:  reports that he quit smoking about 19 years ago. His smoking use included Cigarettes. He has a 30 pack-year smoking history. He has never used smokeless tobacco. He reports that he does not drink alcohol or use illicit drugs.  Allergies: No Known Allergies  Medications: I have reviewed the patient's current medications.  Results for orders placed during the hospital encounter of 02/20/14 (from the past 48 hour(s))  MRSA PCR SCREENING     Status: None   Collection Time    03/05/14  4:53 PM      Result Value Ref Range   MRSA by PCR NEGATIVE  NEGATIVE   Comment:            The  GeneXpert MRSA Assay (FDA     approved for NASAL specimens     only), is one component of a     comprehensive MRSA colonization     surveillance program. It is not     intended to diagnose MRSA     infection nor to guide or     monitor treatment for     MRSA infections.  CBC     Status: Abnormal   Collection Time    03/05/14  6:25 PM      Result Value Ref Range   WBC 14.1 (*) 4.0 - 10.5 K/uL   RBC 3.92 (*) 4.22 - 5.81 MIL/uL   Hemoglobin 11.1 (*) 13.0 - 17.0 g/dL   HCT 34.5 (*) 39.0 - 52.0 %   MCV 88.0  78.0 - 100.0 fL   MCH 28.3  26.0 - 34.0 pg   MCHC 32.2  30.0 - 36.0 g/dL   RDW 14.1  11.5 - 15.5 %   Platelets 310  150 - 400 K/uL  MAGNESIUM     Status: None   Collection Time    03/05/14  6:25 PM      Result Value Ref Range   Magnesium 2.2  1.5 -  2.5 mg/dL  TROPONIN I     Status: Abnormal   Collection Time    03/05/14  6:25 PM      Result Value Ref Range   Troponin I 0.70 (*) <0.30 ng/mL   Comment:            Due to the release kinetics of cTnI,     a negative result within the first hours     of the onset of symptoms does not rule out     myocardial infarction with certainty.     If myocardial infarction is still suspected,     repeat the test at appropriate intervals.     CRITICAL RESULT CALLED TO, READ BACK BY AND VERIFIED WITH:     TARREN,K RN 03/05/14 1924 WOOTEN,K  APTT     Status: None   Collection Time    03/05/14  6:25 PM      Result Value Ref Range   aPTT 31  24 - 37 seconds  PROTIME-INR     Status: None   Collection Time    03/05/14  6:25 PM      Result Value Ref Range   Prothrombin Time 13.7  11.6 - 15.2 seconds   INR 1.05  0.00 - 1.49  FIBRINOGEN     Status: Abnormal   Collection Time    03/05/14  6:25 PM      Result Value Ref Range   Fibrinogen 545 (*) 204 - 475 mg/dL  BLOOD GAS, ARTERIAL     Status: None   Collection Time    03/05/14  6:33 PM      Result Value Ref Range   FIO2 1.00     Delivery systems NON-REBREATHER OXYGEN MASK     pH,  Arterial 7.420  7.350 - 7.450   pCO2 arterial 35.6  35.0 - 45.0 mmHg   pO2, Arterial 83.2  80.0 - 100.0 mmHg   Bicarbonate 22.8  20.0 - 24.0 mEq/L   TCO2 24.0  0 - 100 mmol/L   Acid-base deficit 1.1  0.0 - 2.0 mmol/L   O2 Saturation 96.2     Patient temperature 97.6     Collection site RIGHT RADIAL     Drawn by [id number]     Sample type ARTERIAL DRAW     Allens test (pass/fail) PASS  PASS    Ct Angio Chest Pe W/cm &/or Wo Cm  03/05/2014   CLINICAL DATA:  Increasing shortness of breath with hypotension and diaphoresis. Recent back surgery.  EXAM: CT ANGIOGRAPHY CHEST WITH CONTRAST  TECHNIQUE: Multidetector CT imaging of the chest was performed using the standard protocol during bolus administration of intravenous contrast. Multiplanar CT image reconstructions and MIPs were obtained to evaluate the vascular anatomy.  CONTRAST:  78mL OMNIPAQUE IOHEXOL 350 MG/ML SOLN  COMPARISON:  Radiographs 02/20/2014.  FINDINGS: There is satisfactory opacification of the pulmonary arteries. There is extensive acute pulmonary embolism bilaterally with a large saddle embolus and nearly occlusive thrombus in the right main pulmonary artery. The right ventricle is dilated with an RV to LV ratio of 1.41. There is mild atherosclerosis of the aorta, great vessels and coronary arteries. The heart is mildly enlarged.  There is no significant pleural or pericardial effusion. There is dependent airspace disease in the left lower lobe which may reflect atelectasis or aspiration. Additional scattered atelectasis is present in both lungs. There is a calcified right lower lobe granuloma. There are calcified right hilar lymph nodes.  Images through the  upper abdomen demonstrate no acute findings. There are small splenic granulomas.  Review of the MIP images confirms the above findings.  IMPRESSION: 1. Positive for acute PE with CT evidence of right heart strain (RV/LV Ratio = 1.41) consistent with at least submassive (intermediate  risk)PE. The presence of right heart strain has been associated with an increased risk of morbidity and mortality. Consultation with Pulmonary and Critical Care Medicine is recommended. 2. Left lower lobe airspace disease consistent with atelectasis or aspiration. 3. No significant pleural or pericardial effusion. 4. Critical Value/emergent results were called by telephone at the time of interpretation on 03/05/2014 at 5:50 pm to the patient's nurse Marya Amsler in the ICU, who verbally acknowledged these results.   Electronically Signed   By: Camie Patience M.D.   On: 03/05/2014 17:58    Review of Systems  Constitutional: Negative for fever and chills.  Respiratory: Positive for shortness of breath. Negative for cough, hemoptysis and sputum production.   Cardiovascular: Negative for chest pain, palpitations and leg swelling.  Gastrointestinal: Negative for nausea, vomiting, abdominal pain and diarrhea.  Genitourinary: Negative for dysuria, urgency, hematuria and flank pain.   Blood pressure 121/77, pulse 116, temperature 97.6 F (36.4 C), temperature source Oral, resp. rate 20, height 6\' 3"  (1.905 m), weight 257 lb 11.5 oz (116.9 kg), SpO2 100.00%. Physical Exam  Constitutional: He is oriented to person, place, and time.  Neck: No JVD present.  Cardiovascular: Normal rate, regular rhythm and normal heart sounds.  Exam reveals no gallop and no friction rub.   No murmur heard. Respiratory: Breath sounds normal. He is in respiratory distress. He has no wheezes. He has no rales. He exhibits no tenderness.  GI: He exhibits no distension. There is no tenderness. There is no rebound and no guarding.  Musculoskeletal: He exhibits no edema.  Neurological: He is alert and oriented to person, place, and time.    Assessment/Plan: With acute submassive PE and evidence of right heart strain, the patient does fall into category of patients that would benefit from catheter directed tPA thrombolysis of pulmonary  arteries utilizing the Ekos ultrasound assisted catheter system.  However, the recent lumbar dural patch surgery 10 days ago does place him in a higher risk category for bleeding complication with thrombolytic therapy.  Risks include epidural hemorrhage, need for surgical evacuation and paralysis.  Although dose of tPA with Ekos system is only 24 mg of tPA over 12 hours, there is certainly some bleeding risk.  Currently, the patient has improved clinically with less oxygen requirement.  Still tachycardic, but BP stable.  Given improving clinical status, will hold off on pursuing lytic therapy for now and reassess patient in AM.  Will also obtain Neurosurgery input and consent if lytic therapy for PE is considered.  I would only recommend treatment if there is significant clinical deterioration.  Doppler US of lower extremities would be helpful in assessing risk of potentially another PE.  Patient is now on IV heparin.  Ola Fawver T 03/05/2014, 9:07 PM

## 2014-03-05 NOTE — Consult Note (Signed)
PULMONARY / CRITICAL CARE MEDICINE  Name: Willie Howe MRN: [id number] DOB: [birth year]    ADMISSION DATE:  02/20/2014 CONSULTATION DATE:  03/05/2014  REFERRING MD :  Dr. Ellene Route PRIMARY SERVICE:  Neurosurgery  CHIEF COMPLAINT:  Syncope   BRIEF PATIENT DESCRIPTION: 53 yo admitted 7/6 for exploration of lumbar wound and repair of dural defect, on bed rest since (!) though was receiving Heparin [place] brought to ICU 7/19 after suffering syncopal episode associated with dyspnea, diaphoresis, hypotension, AF/RVR and fall.  In ICU spontaneously converted to sinus tachycardia with RBBB ( baseline ).  SIGNIFICANT EVENTS / STUDIES:  7/19  Head CT >>>  LINES / TUBES:  CULTURES:  ANTIBIOTICS:  The patient is encephalopathic and unable to provide history, which was obtained for available medical records.  HISTORY OF PRESENT ILLNESS:  53 yo admitted 7/6 for exploration of lumbar wound debridement and repair of dural defect, on bed rest since (!) though was receiving Heparin [place] brought to ICU 7/19 after suffering syncopal episode associated with dyspnea, diaphoresis, hypotension, AF/RVR and fall.  In ICU spontaneously converted to sinus tachycardia with RBBB ( baseline ). Denies chest pain.  PAST MEDICAL HISTORY :  Past Medical History  Diagnosis Date  . Arthritis   . Thyroid disease     Graves Disease  . Previous back surgery     x 3  . Kidney stone   . Dysrhythmia   . Anginal pain    Past Surgical History  Procedure Laterality Date  . Knee surgery      x 2  . Nephrolithotomy    . Back surgery    . Colonoscopy    . Lumbar wound debridement N/A 02/23/2014    Procedure: LUMBAR WOUND DEBRIDEMENT;  Surgeon: Faythe Ghee, MD;  Location: [place];  Service: Neurosurgery;  Laterality: N/A;  exploration lumbar wound. repair of dural defect.   Prior to Admission medications   Medication Sig Start Date End Date Taking? Authorizing Provider  cyclobenzaprine (FEXMID) 7.5 MG tablet Take 1 tablet (7.5  mg total) by mouth 3 (three) times daily as needed for muscle spasms. 02/17/14  Yes Faythe Ghee, MD  folic acid (FOLVITE) 1 MG tablet Take 1 mg by mouth daily.   Yes Historical Provider, MD  methotrexate (RHEUMATREX) 2.5 MG tablet Take 20 mg by mouth once a week. 8 tablets on Friday. Caution:Chemotherapy. Protect from light.   Yes Historical Provider, MD  oxyCODONE-acetaminophen (PERCOCET/ROXICET) 5-325 MG per tablet Take 2 tablets by mouth every 4 (four) hours as needed for moderate pain or severe pain.   Yes Historical Provider, MD  predniSONE (DELTASONE) 5 MG tablet Take 5 mg by mouth daily.    Yes Historical Provider, MD   No Known Allergies  FAMILY HISTORY:  Family History  Problem Relation Age of Onset  . CAD Sister    SOCIAL HISTORY:  reports that he quit smoking about 19 years ago. His smoking use included Cigarettes. He has a 30 pack-year smoking history. He has never used smokeless tobacco. He reports that he does not drink alcohol or use illicit drugs.  REVIEW OF SYSTEMS:  As above  INTERVAL HISTORY:  VITAL SIGNS: Temp:  [98.4 F (36.9 C)-99.3 F (37.4 C)] 98.4 F (36.9 C) (07/19 1342) Pulse Rate:  [92-115] 92 (07/19 1342) Resp:  [18] 18 (07/19 1342) BP: (101-131)/(53-85) 114/60 mmHg (07/19 1342) SpO2:  [98 %-100 %] 98 % (07/19 1342)  HEMODYNAMICS:   VENTILATOR SETTINGS:   INTAKE / OUTPUT:  Intake/Output     07/18 0701 - 07/19 0700 07/19 0701 - 07/20 0700   P.O.  600   Total Intake(mL/kg)  600 (5.1)   Urine (mL/kg/hr)  450 (0.4)   Total Output   450   Net   +150        Stool Occurrence 1 x      PHYSICAL EXAMINATION: General:  Appears acutely ill, diaphoretic  Neuro:  Awake, alert HEENT:  No JVD Cardiovascular:  Tachycardic, irregular Lungs:  Bilateral air entry, no added sounds Abdomen:  Soft, nontender, bowel sounds diminished Musculoskeletal:  Moves all extremities, no edema Skin:  Intact  LABS: CBC No results found for this basename: WBC, HGB,  HCT, PLT,  in the last 168 hours  Coag's No results found for this basename: APTT, INR,  in the last 168 hours  BMET  Recent Labs Lab 02/28/14 0759  NA 133*  K 4.2  CL 95*  CO2 26  BUN 7  CREATININE 0.73  GLUCOSE 117*   Electrolytes  Recent Labs Lab 02/28/14 0759  CALCIUM 9.0   Sepsis Markers No results found for this basename: LATICACIDVEN, PROCALCITON, O2SATVEN,  in the last 168 hours  ABG No results found for this basename: PHART, PCO2ART, PO2ART,  in the last 168 hours  Liver Enzymes No results found for this basename: AST, ALT, ALKPHOS, BILITOT, ALBUMIN,  in the last 168 hours  Cardiac Enzymes No results found for this basename: TROPONINI, PROBNP,  in the last 168 hours  Glucose No results found for this basename: GLUCAP,  in the last 168 hours  IMAGING: No results found.  ASSESSMENT / PLAN:  PULMONARY A:   Acute hypoxemic respiratory failure Probable PE P:   Goal SpO2>92 Supplemental oxygen CTA chest May need venous Doppler LE based on CTA results  CARDIOVASCULAR A:  AF-RVR, spontaneously converted to sinus tachycardia Baseline RBBB Possible ACS P:  Trend troponin / lactate May need TTE based on CTA results OK to anticoagulate per Neurosurgery Amiodarone if AF reoccurs  RENAL A:   Normal renal function Appears euvolemic P:   Trend BMP Check Mg NS 500 x 1 NS@100   GASTROINTESTINAL A:   Nutrition GI Px is not indicated P:   NPO for now  HEMATOLOGIC A:   VTE Px P:  CBC now Heparin for DVT Px  INFECTIOUS A:  No active issues P:   No intervention required  ENDOCRINE  A:   At risk for adrenal insufficiency ( chronic steroids ) P:   Hydrocortisone if hemodynamically unstable  NEUROLOGIC A:   S/p lumbar wound debridement and repair of dural defect P:   Per Neurosurgery  I have personally obtained history, examined patient, evaluated and interpreted laboratory and imaging results, reviewed medical records,  formulated assessment / plan and placed orders.  CRITICAL CARE:  The patient is critically ill with multiple organ systems failure and requires high complexity decision making for assessment and support, frequent evaluation and titration of therapies, application of advanced monitoring technologies and extensive interpretation of multiple databases. Critical Care Time devoted to patient care services described in this note is 45 minutes.   Doree Fudge, MD Pulmonary and [place] Pager: [phone]  03/05/2014, 4:48 PM

## 2014-03-05 NOTE — Progress Notes (Signed)
ANTICOAGULATION CONSULT NOTE - Initial Consult  Pharmacy Consult:  Heparin Indication:  New PE  No Known Allergies  Patient Measurements: Height: 6\' 3"  (190.5 cm) Weight: 257 lb 11.5 oz (116.9 kg) IBW/kg (Calculated) : 84.5 Heparin Dosing Weight: 109 kg   Vital Signs: Temp: 97.6 F (36.4 C) (07/19 1700) Temp src: Oral (07/19 1700) BP: 121/99 mmHg (07/19 1730) Pulse Rate: 113 (07/19 1730)  Labs: No results found for this basename: HGB, HCT, PLT, APTT, LABPROT, INR, HEPARINUNFRC, CREATININE, CKTOTAL, CKMB, TROPONINI,  in the last 72 hours  Estimated Creatinine Clearance: 145.6 ml/min (by C-G formula based on Cr of 0.73).   Medical History: Past Medical History  Diagnosis Date  . Arthritis   . Thyroid disease     Graves Disease  . Previous back surgery     x 3  . Kidney stone   . Dysrhythmia   . Anginal pain        Assessment: 30 YOM with history of recent lumbar surgery to start IV heparin for confirmed PE with evidence of right heart strain on CTA.  Patient received heparin SQ around 1400 today.  Baseline labs reviewed.   Goal of Therapy:  Heparin level 0.3-0.7 units/ml Monitor platelets by anticoagulation protocol: Yes    Plan:  - D/C SQ heparin - Heparin gtt at 1600 units/hr, no bolus - Check 6 hr HL - Daily HL / CBC    Ryatt Corsino D. Mina Marble, PharmD, BCPS Pager:  [phone] 03/05/2014, 6:30 PM

## 2014-03-05 NOTE — Progress Notes (Signed)
CRITICAL VALUE ALERT  Critical value received:  Troponin 0.7  Date of notification:  03/05/2014  Time of notification:  1927  Critical value read back:Yes.    Nurse who received alert:  Velvet Bathe, RN  MD notified (1st page):  ELINK   Time of first page:  1937  MD notified (2nd page):  Time of second page:  Responding MD: Dr. Lowry Ram   Time MD responded:  [phone]

## 2014-03-06 DIAGNOSIS — I2699 Other pulmonary embolism without acute cor pulmonale: Secondary | ICD-10-CM

## 2014-03-06 LAB — CBC
HEMATOCRIT: 31.9 % — AB (ref 39.0–52.0)
HEMOGLOBIN: 10.3 g/dL — AB (ref 13.0–17.0)
MCH: 28.7 pg (ref 26.0–34.0)
MCHC: 32.3 g/dL (ref 30.0–36.0)
MCV: 88.9 fL (ref 78.0–100.0)
Platelets: 291 10*3/uL (ref 150–400)
RBC: 3.59 MIL/uL — AB (ref 4.22–5.81)
RDW: 14 % (ref 11.5–15.5)
WBC: 10.1 10*3/uL (ref 4.0–10.5)

## 2014-03-06 LAB — HEPARIN LEVEL (UNFRACTIONATED)
HEPARIN UNFRACTIONATED: 0.46 [IU]/mL (ref 0.30–0.70)
HEPARIN UNFRACTIONATED: 0.26 [IU]/mL — AB (ref 0.30–0.70)

## 2014-03-06 LAB — TROPONIN I
TROPONIN I: 0.91 ng/mL — AB (ref <0.30)
Troponin I: 0.69 ng/mL (ref <0.30)

## 2014-03-06 MED ORDER — WARFARIN SODIUM 10 MG PO TABS
10.0000 mg | ORAL_TABLET | Freq: Once | ORAL | Status: AC
  Administered 2014-03-06: 10 mg via ORAL
  Filled 2014-03-06: qty 1

## 2014-03-06 MED ORDER — PATIENT'S GUIDE TO USING COUMADIN BOOK
Freq: Once | Status: AC
  Administered 2014-03-06: 18:00:00
  Filled 2014-03-06: qty 1

## 2014-03-06 MED ORDER — WARFARIN VIDEO
Freq: Once | Status: AC
  Administered 2014-03-06: 18:00:00

## 2014-03-06 MED ORDER — WARFARIN - PHARMACIST DOSING INPATIENT
Freq: Every day | Status: DC
  Administered 2014-03-07: 1
  Administered 2014-03-08 – 2014-03-09 (×2)

## 2014-03-06 NOTE — Progress Notes (Signed)
UR completed.  Davette Nugent, RN BSN M[facility] CCM Trauma/Neuro ICU Case Manager [phone]  

## 2014-03-06 NOTE — Progress Notes (Deleted)
UR completed.  Vamsi Apfel, RN BSN MHA CCM Trauma/Neuro ICU Case Manager [phone]  

## 2014-03-06 NOTE — Progress Notes (Signed)
eLink Physician-Brief Progress Note Patient Name: Willie Howe DOB: Mar 09, 1961 MRN: [id number]  Date of Service  03/06/2014   HPI/Events of Note   U/s of LE positive DVT RLE  eICU Interventions  Pt already on Heparin drip, continue same    Intervention Category Intermediate Interventions: Diagnostic test evaluation  Asencion Noble 03/06/2014, 5:15 PM

## 2014-03-06 NOTE — Progress Notes (Signed)
PULMONARY / CRITICAL CARE MEDICINE  Name: Willie Howe MRN: [id number] DOB: May 20, 1961    ADMISSION DATE:  02/20/2014 CONSULTATION DATE:  03/05/2014  REFERRING MD :  Dr. Ellene Route PRIMARY SERVICE:  Neurosurgery  CHIEF COMPLAINT:  Syncope   BRIEF PATIENT DESCRIPTION: 53 yo admitted 7/6 for exploration of lumbar wound and repair of dural defect, on bed rest since  though was receiving Heparin [place] brought to ICU 7/19 after suffering syncopal episode associated with dyspnea, diaphoresis, hypotension, AF/RVR and fall.  In ICU spontaneously converted to sinus tachycardia with RBBB ( baseline ).  SIGNIFICANT EVENTS / STUDIES:  7/19  CT angio >>>large saddle embolus and nearly occlusive thrombus in the right main pulmonary artery. RV to LV ratio of 1.41   LINES / TUBES:  CULTURES:  ANTIBIOTICS:   INTERVAL HISTORY: c/o dizziness when he turned over in bed Denies CP, dyspnea  VITAL SIGNS: Temp:  [97.6 F (36.4 C)-98.7 F (37.1 C)] 98.7 F (37.1 C) (07/20 0817) Pulse Rate:  [92-120] 96 (07/20 0700) Resp:  [15-35] 17 (07/20 0700) BP: (88-127)/(46-99) 106/66 mmHg (07/20 0700) SpO2:  [98 %-100 %] 100 % (07/20 0700)  HEMODYNAMICS:   VENTILATOR SETTINGS:   INTAKE / OUTPUT: Intake/Output     07/19 0701 - 07/20 0700 07/20 0701 - 07/21 0700   P.O. 600    I.V. (mL/kg) 1484.3 (12.7) 136 (1.2)   IV Piggyback 500    Total Intake(mL/kg) 2584.3 (22.1) 136 (1.2)   Urine (mL/kg/hr) 1125 (0.4)    Total Output 1125     Net +1459.3 +136        Urine Occurrence 2 x      PHYSICAL EXAMINATION: General:  Appears acutely ill, supine in bed Neuro:  Awake, alert HEENT:  No JVD Cardiovascular:  Tachycardic, irregular Lungs:  Bilateral air entry, no added sounds Abdomen:  Soft, nontender, bowel sounds diminished Musculoskeletal:  Moves all extremities, no edema Skin:  Intact  LABS: CBC  Recent Labs Lab 03/05/14 1825 03/06/14 0452  WBC 14.1* 10.1  HGB 11.1* 10.3*  HCT 34.5* 31.9*  PLT 310  291    Coag's  Recent Labs Lab 03/05/14 1825  APTT 31  INR 1.05    BMET  Recent Labs Lab 02/28/14 0759  NA 133*  K 4.2  CL 95*  CO2 26  BUN 7  CREATININE 0.73  GLUCOSE 117*   Electrolytes  Recent Labs Lab 02/28/14 0759 03/05/14 1825  CALCIUM 9.0  --   MG  --  2.2   Sepsis Markers No results found for this basename: LATICACIDVEN, PROCALCITON, O2SATVEN,  in the last 168 hours  ABG  Recent Labs Lab 03/05/14 1833  PHART 7.420  PCO2ART 35.6  PO2ART 83.2    Liver Enzymes No results found for this basename: AST, ALT, ALKPHOS, BILITOT, ALBUMIN,  in the last 168 hours  Cardiac Enzymes  Recent Labs Lab 03/05/14 1825 03/06/14 0025 03/06/14 0452  TROPONINI 0.70* 0.91* 0.69*    Glucose No results found for this basename: GLUCAP,  in the last 168 hours  IMAGING: Ct Angio Chest Pe W/cm &/or Wo Cm  03/05/2014   CLINICAL DATA:  Increasing shortness of breath with hypotension and diaphoresis. Recent back surgery.  EXAM: CT ANGIOGRAPHY CHEST WITH CONTRAST  TECHNIQUE: Multidetector CT imaging of the chest was performed using the standard protocol during bolus administration of intravenous contrast. Multiplanar CT image reconstructions and MIPs were obtained to evaluate the vascular anatomy.  CONTRAST:  69mL OMNIPAQUE IOHEXOL 350 MG/ML  SOLN  COMPARISON:  Radiographs 02/20/2014.  FINDINGS: There is satisfactory opacification of the pulmonary arteries. There is extensive acute pulmonary embolism bilaterally with a large saddle embolus and nearly occlusive thrombus in the right main pulmonary artery. The right ventricle is dilated with an RV to LV ratio of 1.41. There is mild atherosclerosis of the aorta, great vessels and coronary arteries. The heart is mildly enlarged.  There is no significant pleural or pericardial effusion. There is dependent airspace disease in the left lower lobe which may reflect atelectasis or aspiration. Additional scattered atelectasis is present in  both lungs. There is a calcified right lower lobe granuloma. There are calcified right hilar lymph nodes.  Images through the upper abdomen demonstrate no acute findings. There are small splenic granulomas.  Review of the MIP images confirms the above findings.  IMPRESSION: 1. Positive for acute PE with CT evidence of right heart strain (RV/LV Ratio = 1.41) consistent with at least submassive (intermediate risk)PE. The presence of right heart strain has been associated with an increased risk of morbidity and mortality. Consultation with Pulmonary and Critical Care Medicine is recommended. 2. Left lower lobe airspace disease consistent with atelectasis or aspiration. 3. No significant pleural or pericardial effusion. 4. Critical Value/emergent results were called by telephone at the time of interpretation on 03/05/2014 at 5:50 pm to the patient's nurse Marya Amsler in the ICU, who verbally acknowledged these results.   Electronically Signed   By: Camie Patience M.D.   On: 03/05/2014 17:58    ASSESSMENT / PLAN:  PULMONARY A:   Acute hypoxemic respiratory failure Sub massive PE P:   Goal SpO2>92 Supplemental oxygen Check venous Doppler LE  CARDIOVASCULAR A:  AF-RVR, spontaneously converted to sinus tachycardia Baseline RBBB Pos trops, RV strain P:  OK to anticoagulate per Neurosurgery, ct IV heparin Amiodarone if AF reoccurs  RENAL A:   Normal renal function Appears euvolemic P:   Trend BMP Check Mg NS 500 x 1 NS@100   GASTROINTESTINAL A:   Nutrition GI Px is not indicated P:   Advance PO  HEMATOLOGIC A:  On heparin P:  CBC now Monitor levels   ENDOCRINE  A:   At risk for adrenal insufficiency ( chronic steroids ) P:   Hydrocortisone if hemodynamically unstable  NEUROLOGIC A:   S/p lumbar wound debridement and repair of dural defect P:   Coumadin when ok with Per Neurosurgery    Gwenevere Abbot MD. FCCP. [place] [facility] Pager [phone] If  no response call [phone]    03/06/2014, 9:12 AM

## 2014-03-06 NOTE — Progress Notes (Signed)
ANTICOAGULATION CONSULT NOTE - Follow Up Consult  Pharmacy Consult for heparin Indication: pulmonary embolus  Labs:  Recent Labs  03/05/14 1825 03/06/14 0025  HGB 11.1*  --   HCT 34.5*  --   PLT 310  --   APTT 31  --   LABPROT 13.7  --   INR 1.05  --   HEPARINUNFRC  --  0.26*  TROPONINI 0.70*  --     Assessment: 53yo male slightly subtherapeutic on heparin with initial dosing for PE; echo has confirmed right heart strain, team currently assessing risk/benefit for lysis.  Goal of Therapy:  Heparin level 0.3-0.7 units/ml   Plan:  Will increase heparin gtt by 1-2 units/kg/hr to 1800 units/hr and check level in 6hr.  Wynona Neat, PharmD, BCPS  03/06/2014,1:38 AM

## 2014-03-06 NOTE — Progress Notes (Signed)
VASCULAR LAB PRELIMINARY  PRELIMINARY  PRELIMINARY  PRELIMINARY  Bilateral lower extremity venous duplex  completed.    Preliminary report:  Right:  DVT of indeterminate age noted in the mid to distal FV, poplieal v, gastrocnemius v, PTV, and peroneal v .  There is minimal flow noted.  No evidence of superficial thrombosis.  No Baker's cyst.  Left:  No evidence of DVT, superficial thrombosis, or Baker's cyst.  Aashish Hamm, RVT 03/06/2014, 3:08 PM

## 2014-03-06 NOTE — Progress Notes (Signed)
Patient ID: Willie Howe, male   DOB: [birth year], 53 y.o.   MRN: [id number] Afeb, vss. Events of last night noted. Patient with positive PE inspite of lovenox and PAS hose. He looks good now. Good stas, not short of breath. Wound looks excellent. Mobilization etc per CCM depending on his DVT/PE treatment.

## 2014-03-06 NOTE — Progress Notes (Signed)
ANTICOAGULATION CONSULT NOTE - Follow Up Consult  Pharmacy Consult for Heparin and Coumadin Indication: pulmonary embolus  No Known Allergies  Patient Measurements: Height: 6\' 3"  (190.5 cm) Weight: 257 lb 11.5 oz (116.9 kg) IBW/kg (Calculated) : 84.5 Heparin Dosing Weight: 110 kg  Vital Signs: Temp: 98.1 F (36.7 C) (07/20 1218) Temp src: Oral (07/20 1218) BP: 140/80 mmHg (07/20 1300) Pulse Rate: 101 (07/20 1300)  Labs:  Recent Labs  03/05/14 1825 03/06/14 0025 03/06/14 0452 03/06/14 0744  HGB 11.1*  --  10.3*  --   HCT 34.5*  --  31.9*  --   PLT 310  --  291  --   APTT 31  --   --   --   LABPROT 13.7  --   --   --   INR 1.05  --   --   --   HEPARINUNFRC  --  0.26*  --  0.46  TROPONINI 0.70* 0.91* 0.69*  --     Estimated Creatinine Clearance: 145.6 ml/min (by C-G formula based on Cr of 0.73).  Assessment:   Heparin level therapeutic (0.46) today on 1800 units/hr. Coumadin to begin per OK with Neurosurgery. Procedure done 02/20/14.  Coumadin predictor score  = 11.  Overlap day # 1 of 5 minimum.  Goal of Therapy:  INR 2-3 Heparin level 0.3-0.7 units/ml Monitor platelets by anticoagulation protocol: Yes   Plan:   Continue heparin drip at 1800 units/hr.  Coumadin 10 mg x 1 today.  Daily heparin level, PT/INR and CBC.  Continue heparin and Coumadin overlap for 5 days minimum, or until INR >2 for 2 consecutive days.  Coumadin education prior to discharge.  Discussed some with patient and wife today. Will provide booklet and request that they watch the education video.  Arty Baumgartner, [place] Pager: [phone] 03/06/2014,2:11 PM

## 2014-03-07 LAB — CBC
HCT: 30.7 % — ABNORMAL LOW (ref 39.0–52.0)
Hemoglobin: 9.8 g/dL — ABNORMAL LOW (ref 13.0–17.0)
MCH: 28.2 pg (ref 26.0–34.0)
MCHC: 31.9 g/dL (ref 30.0–36.0)
MCV: 88.2 fL (ref 78.0–100.0)
PLATELETS: 265 10*3/uL (ref 150–400)
RBC: 3.48 MIL/uL — ABNORMAL LOW (ref 4.22–5.81)
RDW: 14.1 % (ref 11.5–15.5)
WBC: 9.4 10*3/uL (ref 4.0–10.5)

## 2014-03-07 LAB — HEPARIN LEVEL (UNFRACTIONATED): HEPARIN UNFRACTIONATED: 0.34 [IU]/mL (ref 0.30–0.70)

## 2014-03-07 LAB — PROTIME-INR
INR: 1.13 (ref 0.00–1.49)
Prothrombin Time: 14.5 seconds (ref 11.6–15.2)

## 2014-03-07 MED ORDER — WARFARIN SODIUM 10 MG PO TABS
10.0000 mg | ORAL_TABLET | Freq: Once | ORAL | Status: AC
  Administered 2014-03-07: 10 mg via ORAL
  Filled 2014-03-07: qty 1

## 2014-03-07 NOTE — Progress Notes (Signed)
Patient ID: Willie Howe, male   DOB: [birth year], 53 y.o.   MRN: [id number] Subjective: Patient reports feeling better  Objective: Vital signs in last 24 hours: Temp:  [97.7 F (36.5 C)-98.9 F (37.2 C)] 97.7 F (36.5 C) (07/21 0700) Pulse Rate:  [87-111] 94 (07/21 1000) Resp:  [16-27] 17 (07/21 1000) BP: (94-140)/(52-83) 110/72 mmHg (07/21 1000) SpO2:  [87 %-100 %] 97 % (07/21 1000)  Intake/Output from previous day: 07/20 0701 - 07/21 0700 In: 2750 [I.V.:2750] Out: 1225 [Urine:1225] Intake/Output this shift: Total I/O In: 97 [I.V.:97] Out: 250 [Urine:250]  Wound:clean and dry; no swelling; no new neuro issues  Lab Results:  Recent Labs  03/06/14 0452 03/07/14 0253  WBC 10.1 9.4  HGB 10.3* 9.8*  HCT 31.9* 30.7*  PLT 291 265   BMET No results found for this basename: NA, K, CL, CO2, GLUCOSE, BUN, CREATININE, CALCIUM,  in the last 72 hours  Studies/Results: Ct Angio Chest Pe W/cm &/or Wo Cm  03/05/2014   CLINICAL DATA:  Increasing shortness of breath with hypotension and diaphoresis. Recent back surgery.  EXAM: CT ANGIOGRAPHY CHEST WITH CONTRAST  TECHNIQUE: Multidetector CT imaging of the chest was performed using the standard protocol during bolus administration of intravenous contrast. Multiplanar CT image reconstructions and MIPs were obtained to evaluate the vascular anatomy.  CONTRAST:  41mL OMNIPAQUE IOHEXOL 350 MG/ML SOLN  COMPARISON:  Radiographs 02/20/2014.  FINDINGS: There is satisfactory opacification of the pulmonary arteries. There is extensive acute pulmonary embolism bilaterally with a large saddle embolus and nearly occlusive thrombus in the right main pulmonary artery. The right ventricle is dilated with an RV to LV ratio of 1.41. There is mild atherosclerosis of the aorta, great vessels and coronary arteries. The heart is mildly enlarged.  There is no significant pleural or pericardial effusion. There is dependent airspace disease in the left lower lobe which  may reflect atelectasis or aspiration. Additional scattered atelectasis is present in both lungs. There is a calcified right lower lobe granuloma. There are calcified right hilar lymph nodes.  Images through the upper abdomen demonstrate no acute findings. There are small splenic granulomas.  Review of the MIP images confirms the above findings.  IMPRESSION: 1. Positive for acute PE with CT evidence of right heart strain (RV/LV Ratio = 1.41) consistent with at least submassive (intermediate risk)PE. The presence of right heart strain has been associated with an increased risk of morbidity and mortality. Consultation with Pulmonary and Critical Care Medicine is recommended. 2. Left lower lobe airspace disease consistent with atelectasis or aspiration. 3. No significant pleural or pericardial effusion. 4. Critical Value/emergent results were called by telephone at the time of interpretation on 03/05/2014 at 5:50 pm to the patient's nurse Marya Amsler in the ICU, who verbally acknowledged these results.   Electronically Signed   By: Camie Patience M.D.   On: 03/05/2014 17:58    Assessment/Plan: Doing well. Advance activity as per ccm; continues to make nice progress   LOS: 15 days  as above   Faythe Ghee, MD 03/07/2014, 10:13 AM

## 2014-03-07 NOTE — Progress Notes (Signed)
ANTICOAGULATION CONSULT NOTE - Follow Up Consult  Pharmacy Consult for Heparin and Coumadin Indication: pulmonary embolus  No Known Allergies  Patient Measurements: Height: 6\' 3"  (190.5 cm) Weight: 257 lb 11.5 oz (116.9 kg) IBW/kg (Calculated) : 84.5 Heparin Dosing Weight: 110 kg  Vital Signs: Temp: 97.8 F (36.6 C) (07/21 1200) Temp src: Oral (07/21 1200) BP: 115/69 mmHg (07/21 1200) Pulse Rate: 95 (07/21 1200)  Labs:  Recent Labs  03/05/14 1825 03/06/14 0025 03/06/14 0452 03/06/14 0744 03/07/14 0253  HGB 11.1*  --  10.3*  --  9.8*  HCT 34.5*  --  31.9*  --  30.7*  PLT 310  --  291  --  265  APTT 31  --   --   --   --   LABPROT 13.7  --   --   --  14.5  INR 1.05  --   --   --  1.13  HEPARINUNFRC  --  0.26*  --  0.46 0.34  TROPONINI 0.70* 0.91* 0.69*  --   --     Estimated Creatinine Clearance: 145.6 ml/min (by C-G formula based on Cr of 0.73).  Assessment: 79 yom continues on IV heparin and coumadin for new large saddle PE with RHS. Also noted R DVT of indeterminate age. Heparin level remains therapeutic at 0.34. INR is subtherapeutic as expected at 1.13. CBC ist stable and no bleeding noted.   Goal of Therapy:  INR 2-3 Heparin level 0.3-0.7 units/ml Monitor platelets by anticoagulation protocol: Yes   Plan:  1. Continue heparin gtt 1800 units/hr 2. Repeat coumadin 10mg  PO x 1 tonight 3. F/u AM heparin level, CBC and INR  Salome Arnt, PharmD, BCPS Pager # [phone] 03/07/2014 1:20 PM

## 2014-03-08 LAB — CBC
HCT: 32.8 % — ABNORMAL LOW (ref 39.0–52.0)
Hemoglobin: 10.6 g/dL — ABNORMAL LOW (ref 13.0–17.0)
MCH: 28.9 pg (ref 26.0–34.0)
MCHC: 32.3 g/dL (ref 30.0–36.0)
MCV: 89.4 fL (ref 78.0–100.0)
Platelets: 255 10*3/uL (ref 150–400)
RBC: 3.67 MIL/uL — ABNORMAL LOW (ref 4.22–5.81)
RDW: 13.9 % (ref 11.5–15.5)
WBC: 7.5 10*3/uL (ref 4.0–10.5)

## 2014-03-08 LAB — HEPARIN LEVEL (UNFRACTIONATED)
HEPARIN UNFRACTIONATED: 0.29 [IU]/mL — AB (ref 0.30–0.70)
Heparin Unfractionated: 0.43 IU/mL (ref 0.30–0.70)

## 2014-03-08 LAB — PROTIME-INR
INR: 1.39 (ref 0.00–1.49)
PROTHROMBIN TIME: 17.1 s — AB (ref 11.6–15.2)

## 2014-03-08 MED ORDER — WARFARIN SODIUM 5 MG PO TABS
10.0000 mg | ORAL_TABLET | Freq: Once | ORAL | Status: AC
  Administered 2014-03-08: 10 mg via ORAL
  Filled 2014-03-08: qty 2
  Filled 2014-03-08: qty 1

## 2014-03-08 NOTE — Progress Notes (Signed)
PULMONARY / CRITICAL CARE MEDICINE  Name: Willie Howe MRN: [id number] DOB: 06/16/1961    ADMISSION DATE:  02/20/2014 CONSULTATION DATE:  03/05/2014  REFERRING MD :  Dr. Ellene Route PRIMARY SERVICE:  Neurosurgery  CHIEF COMPLAINT:  Syncope   BRIEF PATIENT DESCRIPTION: 53 yo admitted 7/6 for exploration of lumbar wound and repair of dural defect, on bed rest since  though was receiving Heparin [place] brought to ICU 7/19 after suffering syncopal episode associated with dyspnea, diaphoresis, hypotension, AF/RVR and fall.  In ICU spontaneously converted to sinus tachycardia with RBBB ( baseline ).  SIGNIFICANT EVENTS / STUDIES:  7/19  CT angio >>>large saddle embolus and nearly occlusive thrombus in the right main pulmonary artery. RV to LV ratio of 1.41 7/20 duplex RLE DVT    INTERVAL HISTORY: No dizziness  Denies CP, dyspnea afebrile  VITAL SIGNS: Temp:  [97.5 F (36.4 C)-98.1 F (36.7 C)] 97.8 F (36.6 C) (07/22 0809) Pulse Rate:  [80-107] 87 (07/22 0515) Resp:  [14-27] 19 (07/22 0515) BP: (94-137)/(57-81) 114/70 mmHg (07/22 0600) SpO2:  [92 %-100 %] 99 % (07/22 0515)  HEMODYNAMICS:   VENTILATOR SETTINGS:   INTAKE / OUTPUT: Intake/Output     07/21 0701 - 07/22 0700 07/22 0701 - 07/23 0700   P.O. 960    I.V. (mL/kg) 686.7 (5.9) 29 (0.2)   Total Intake(mL/kg) 1646.7 (14.1) 29 (0.2)   Urine (mL/kg/hr) 2180 (0.8) 350 (1.8)   Total Output 2180 350   Net -533.4 -321        Urine Occurrence 1 x      PHYSICAL EXAMINATION: General:  Appears well, supine in bed Neuro:  Awake, alert HEENT:  No JVD Cardiovascular:  RRR, s1 s2 nml Lungs:  Bilateral air entry, no added sounds Abdomen:  Soft, nontender, bowel sounds diminished Musculoskeletal:  Moves all extremities, no edema Skin:  Intact  LABS: CBC  Recent Labs Lab 03/06/14 0452 03/07/14 0253 03/08/14 0228  WBC 10.1 9.4 7.5  HGB 10.3* 9.8* 10.6*  HCT 31.9* 30.7* 32.8*  PLT 291 265 255    Coag's  Recent Labs Lab  03/05/14 1825 03/07/14 0253 03/08/14 0228  APTT 31  --   --   INR 1.05 1.13 1.39    BMET No results found for this basename: NA, K, CL, CO2, BUN, CREATININE, GLUCOSE,  in the last 168 hours Electrolytes  Recent Labs Lab 03/05/14 1825  MG 2.2   Sepsis Markers No results found for this basename: LATICACIDVEN, PROCALCITON, O2SATVEN,  in the last 168 hours  ABG  Recent Labs Lab 03/05/14 1833  PHART 7.420  PCO2ART 35.6  PO2ART 83.2    Liver Enzymes No results found for this basename: AST, ALT, ALKPHOS, BILITOT, ALBUMIN,  in the last 168 hours  Cardiac Enzymes  Recent Labs Lab 03/05/14 1825 03/06/14 0025 03/06/14 0452  TROPONINI 0.70* 0.91* 0.69*    Glucose No results found for this basename: GLUCAP,  in the last 168 hours  IMAGING: No results found.  ASSESSMENT / PLAN:  PULMONARY A:   Acute hypoxemic respiratory failure -resolved Sub massive PE RLE DVT P:   Goal SpO2>92 Supplemental oxygen  CARDIOVASCULAR A:  AF-RVR, spontaneously converted to sinus tachycardia Baseline RBBB Pos trops, RV strain P:  ct IV heparin --> coumadin Amiodarone if AF reoccurs   HEMATOLOGIC A:  On heparin P:  CBC daily, can change to lovenox in 24h, ensure 48h overlap   ENDOCRINE  A:   At risk for adrenal insufficiency ( chronic  steroids ) P:   Resume 5 mg pred  NEUROLOGIC A:   S/p lumbar wound debridement and repair of dural defect P:   Per Neurosurgery   OK to transfer to floor, would keep in hospital until coumadin therapeutic & ensure overlap x48h after therapeutic INR   Gwenevere Abbot MD. [facility]. [place] [facility] Pager [phone] If no response call [phone]    03/08/2014, 8:42 AM

## 2014-03-08 NOTE — Evaluation (Signed)
Physical Therapy Evaluation Patient Details Name: Willie Howe MRN: [id number] DOB: August 30, 1960 Today's Date: 03/08/2014   History of Present Illness  53 yo admitted 7/6 for exploration of lumbar wound and repair of dural defect, on bed rest since  though was receiving Heparin [place] brought to ICU 7/19 after suffering syncopal episode associated with dyspnea, diaphoresis, hypotension, AF/RVR and fall.  Clinical Impression  Patient demonstrates deficits in functional mobility as indicated below. Will benefit from acute skilled PT to address safety and function. Will see as indicated and progress as tolerated.     Follow Up Recommendations No PT follow up;Supervision for mobility/OOB    Equipment Recommendations  None recommended by PT    Recommendations for Other Services       Precautions / Restrictions Precautions Precautions: Back Precaution Booklet Issued:  (one in room) Precaution Comments: Reviewed precautions Required Braces or Orthoses: Spinal Brace Spinal Brace: Lumbar corset;Applied in sitting position Restrictions Weight Bearing Restrictions: No      Mobility  Bed Mobility Overal bed mobility: Modified Independent Bed Mobility: Rolling;Sidelying to Sit;Sit to Sidelying Rolling: Modified independent (Device/Increase time) Sidelying to sit: Modified independent (Device/Increase time)     Sit to sidelying: Modified independent (Device/Increase time) General bed mobility comments: Good technique  Transfers Overall transfer level: Needs assistance Equipment used: Rolling walker (2 wheeled) Transfers: Sit to/from Stand Sit to Stand: Min guard         General transfer comment: VCs for hand placement with use of RW  Ambulation/Gait Ambulation/Gait assistance: Supervision Ambulation Distance (Feet): 210 Feet Assistive device: Rolling walker (2 wheeled) Gait Pattern/deviations: Antalgic (arthritic changes impacting gait) Gait velocity: decreased Gait velocity  interpretation: Below normal speed for age/gender    Stairs            Wheelchair Mobility    Modified Rankin (Stroke Patients Only)       Balance Overall balance assessment: No apparent balance deficits (not formally assessed)                                           Pertinent Vitals/Pain 2/10, all VSS    Home Living Family/patient expects to be discharged to:: Private residence Living Arrangements: Spouse/significant other Available Help at Discharge: Family;Available PRN/intermittently Type of Home: House Home Access: Stairs to enter Entrance Stairs-Rails: Psychiatric nurse of Steps: 5 Home Layout: One level Home Equipment: Cane - single point      Prior Function Level of Independence: Independent with assistive device(s)               Hand Dominance   Dominant Hand: Right    Extremity/Trunk Assessment               Lower Extremity Assessment: Overall WFL for tasks assessed RLE Deficits / Details: Postive arthritic changes    Cervical / Trunk Assessment: Normal  Communication   Communication: No difficulties  Cognition Arousal/Alertness: Awake/alert Behavior During Therapy: WFL for tasks assessed/performed Overall Cognitive Status: Within Functional Limits for tasks assessed                      General Comments      Exercises        Assessment/Plan    PT Assessment Patient needs continued PT services  PT Diagnosis Difficulty walking;Acute pain   PT Problem List Decreased strength;Decreased range of motion;Decreased activity tolerance;Decreased  balance;Decreased mobility;Decreased knowledge of use of DME;Decreased safety awareness;Decreased knowledge of precautions;Pain  PT Treatment Interventions DME instruction;Gait training;Functional mobility training;Stair training;Therapeutic activities;Therapeutic exercise;Neuromuscular re-education;Patient/family education   PT Goals (Current  goals can be found in the Care Plan section) Acute Rehab PT Goals Patient Stated Goal: not stated PT Goal Formulation: With patient/family Time For Goal Achievement: 02/18/14 Potential to Achieve Goals: Good    Frequency Min 5X/week   Barriers to discharge        Co-evaluation               End of Session Equipment Utilized During Treatment: Back brace Activity Tolerance: Patient tolerated treatment well Patient left: in chair;with call bell/phone within reach;with family/visitor present (in wheel chair for transfer to new unit) Nurse Communication: Mobility status         Time: 0258-5277 PT Time Calculation (min): 27 min   Charges:   PT Evaluation $Initial PT Evaluation Tier I: 1 Procedure PT Treatments $Gait Training: 8-22 mins $Self Care/Home Management: 8-22   PT G CodesDuncan Dull 03/08/2014, 10:18 AM Alben Deeds, [place] DPT  [phone]

## 2014-03-08 NOTE — Progress Notes (Signed)
Patient ID: Willie Howe, male   DOB: October 25, 1960, 53 y.o.   MRN: [id number] Afeb, vss No new neuro issues. Feels steadily better. Increasing activity with therapy. He feels he can go home in a few days rather than go to rehab. Will wait for medical service to say he is ready.

## 2014-03-08 NOTE — Progress Notes (Signed)
ANTICOAGULATION CONSULT NOTE - Follow Up Consult  Pharmacy Consult for Heparin and Coumadin Indication: pulmonary embolus  No Known Allergies  Patient Measurements: Height: 6\' 3"  (190.5 cm) Weight: 257 lb 11.5 oz (116.9 kg) IBW/kg (Calculated) : 84.5 Heparin Dosing Weight: 110 kg  Vital Signs: Temp: 97.8 F (36.6 C) (07/22 0809) Temp src: Oral (07/22 0809) BP: 114/70 mmHg (07/22 0600) Pulse Rate: 87 (07/22 0515)  Labs:  Recent Labs  03/05/14 1825 03/06/14 0025 03/06/14 0452  03/07/14 0253 03/08/14 0228 03/08/14 0933  HGB 11.1*  --  10.3*  --  9.8* 10.6*  --   HCT 34.5*  --  31.9*  --  30.7* 32.8*  --   PLT 310  --  291  --  265 255  --   APTT 31  --   --   --   --   --   --   LABPROT 13.7  --   --   --  14.5 17.1*  --   INR 1.05  --   --   --  1.13 1.39  --   HEPARINUNFRC  --  0.26*  --   < > 0.34 0.29* 0.43  TROPONINI 0.70* 0.91* 0.69*  --   --   --   --   < > = values in this interval not displayed.  Estimated Creatinine Clearance: 145.6 ml/min (by C-G formula based on Cr of 0.73).  Assessment: 50 yom continues on IV heparin and coumadin for new large saddle PE with RHS. Also noted R DVT of indeterminate age. Heparin level is therapeutic at 0.43. INR is subtherapeutic as expected at 1.39. CBC ist stable and no bleeding noted.   Goal of Therapy:  INR 2-3 Heparin level 0.3-0.7 units/ml Monitor platelets by anticoagulation protocol: Yes   Plan:  1. Continue heparin gtt 1900 units/hr 2. Repeat coumadin 10mg  PO x 1 tonight 3. F/u AM heparin level, CBC and INR  Salome Arnt, PharmD, BCPS Pager # [phone] 03/08/2014 10:28 AM

## 2014-03-08 NOTE — Progress Notes (Signed)
ANTICOAGULATION CONSULT NOTE - Follow Up Consult  Pharmacy Consult for heparin Indication: pulmonary embolus  Labs:  Recent Labs  03/05/14 1825  03/06/14 0025 03/06/14 0452 03/06/14 0744 03/07/14 0253 03/08/14 0228  HGB 11.1*  --   --  10.3*  --  9.8* 10.6*  HCT 34.5*  --   --  31.9*  --  30.7* 32.8*  PLT 310  --   --  291  --  265 255  APTT 31  --   --   --   --   --   --   LABPROT 13.7  --   --   --   --  14.5 17.1*  INR 1.05  --   --   --   --  1.13 1.39  HEPARINUNFRC  --   < > 0.26*  --  0.46 0.34 0.29*  TROPONINI 0.70*  --  0.91* 0.69*  --   --   --   < > = values in this interval not displayed.   Assessment: 53yo male now subtherapeutic on heparin after two levels at goal though had been trending down and yesterday was at low end of goal.  Goal of Therapy:  Heparin level 0.3-0.7 units/ml   Plan:  Will increase heparin gtt by 1 unit/kg/hr to 1900 units/hr and check level in 6hr.  Wynona Neat, PharmD, BCPS  03/08/2014,3:37 AM

## 2014-03-09 LAB — CBC
HEMATOCRIT: 32.4 % — AB (ref 39.0–52.0)
Hemoglobin: 10.6 g/dL — ABNORMAL LOW (ref 13.0–17.0)
MCH: 28.5 pg (ref 26.0–34.0)
MCHC: 32.7 g/dL (ref 30.0–36.0)
MCV: 87.1 fL (ref 78.0–100.0)
PLATELETS: 273 10*3/uL (ref 150–400)
RBC: 3.72 MIL/uL — ABNORMAL LOW (ref 4.22–5.81)
RDW: 13.9 % (ref 11.5–15.5)
WBC: 6.6 10*3/uL (ref 4.0–10.5)

## 2014-03-09 LAB — PROTIME-INR
INR: 1.95 — AB (ref 0.00–1.49)
Prothrombin Time: 22.2 seconds — ABNORMAL HIGH (ref 11.6–15.2)

## 2014-03-09 LAB — HEPARIN LEVEL (UNFRACTIONATED): Heparin Unfractionated: 0.42 IU/mL (ref 0.30–0.70)

## 2014-03-09 MED ORDER — WARFARIN SODIUM 5 MG PO TABS
5.0000 mg | ORAL_TABLET | Freq: Once | ORAL | Status: AC
  Administered 2014-03-09: 5 mg via ORAL
  Filled 2014-03-09: qty 1

## 2014-03-09 MED ORDER — WARFARIN VIDEO
Freq: Once | Status: AC
  Administered 2014-03-09: 11:00:00

## 2014-03-09 MED ORDER — PATIENT'S GUIDE TO USING COUMADIN BOOK
Freq: Once | Status: AC
  Administered 2014-03-09: 11:00:00
  Filled 2014-03-09: qty 1

## 2014-03-09 NOTE — Progress Notes (Signed)
Surgical sutures removed to patient's lower back incision per MD's orders. Patient tolerated well. Edges approximated and no bleeding or drainage noted.

## 2014-03-09 NOTE — Progress Notes (Signed)
Patient ID: Willie Howe, male   DOB: May 20, 1961, 53 y.o.   MRN: [id number] Afeb, vss. No new neuro issues. Increasing activity steadily. Wound looks excellent. No headache! Once his coumadin is therapeutic, he will be ready for d/c.

## 2014-03-09 NOTE — Progress Notes (Signed)
Physical Therapy Treatment Patient Details Name: Willie Howe MRN: [id number] DOB: 04-17-1961 Today's Date: 03/09/2014    History of Present Illness 53 yo admitted 7/6 for exploration of lumbar wound and repair of dural defect, on bed rest since  though was receiving Heparin [place] brought to ICU 7/19 after suffering syncopal episode associated with dyspnea, diaphoresis, hypotension, AF/RVR and fall.    PT Comments    Progressing well.  Reinforced all education and typical progression of activity after d/c  Follow Up Recommendations  No PT follow up;Supervision for mobility/OOB     Equipment Recommendations  None recommended by PT    Recommendations for Other Services       Precautions / Restrictions Precautions Precautions: Back Precaution Comments: Reviewed precautions Required Braces or Orthoses: Spinal Brace Spinal Brace: Lumbar corset;Applied in sitting position    Mobility  Bed Mobility Overal bed mobility: Modified Independent   Rolling: Modified independent (Device/Increase time) Sidelying to sit: Modified independent (Device/Increase time)     Sit to sidelying: Modified independent (Device/Increase time) General bed mobility comments: Good technique, but reinforced best technique  Transfers Overall transfer level: Needs assistance   Transfers: Sit to/from Stand Sit to Stand: Min guard         General transfer comment: VCs for hand placement with use of RW  Ambulation/Gait Ambulation/Gait assistance: Supervision Ambulation Distance (Feet): 400 Feet Assistive device: Rolling walker (2 wheeled) Gait Pattern/deviations: Step-through pattern;Antalgic Gait velocity: decreased   General Gait Details: steady; tried to go without RW and unable due to knee pain   Stairs            Wheelchair Mobility    Modified Rankin (Stroke Patients Only)       Balance Overall balance assessment: No apparent balance deficits (not formally assessed)                                   Cognition Arousal/Alertness: Awake/alert Behavior During Therapy: WFL for tasks assessed/performed Overall Cognitive Status: Within Functional Limits for tasks assessed                      Exercises      General Comments General comments (skin integrity, edema, etc.): Readdressed all back education      Pertinent Vitals/Pain     Home Living                      Prior Function            PT Goals (current goals can now be found in the care plan section) Acute Rehab PT Goals PT Goal Formulation: With patient/family Time For Goal Achievement: 03/21/14 Potential to Achieve Goals: Good Progress towards PT goals: Progressing toward goals    Frequency  Min 5X/week    PT Plan Current plan remains appropriate    Co-evaluation             End of Session Equipment Utilized During Treatment: Back brace Activity Tolerance: Patient tolerated treatment well Patient left: in chair;with call bell/phone within reach;with family/visitor present     Time: 1247-1310 PT Time Calculation (min): 23 min  Charges:  $Gait Training: 8-22 mins $Therapeutic Activity: 8-22 mins                    G Codes:      Willie Howe, Willie Howe 03/09/2014, 1:26 PM 03/09/2014  Willie Howe  Willie Howe, Willie Howe [phone]  (pager)

## 2014-03-09 NOTE — Progress Notes (Signed)
PULMONARY / CRITICAL CARE MEDICINE  Name: Willie Howe MRN: [id number] DOB: June 03, 1961    ADMISSION DATE:  02/20/2014 CONSULTATION DATE:  03/05/2014  REFERRING MD :  Dr. Ellene Route PRIMARY SERVICE:  Neurosurgery  CHIEF COMPLAINT:  Syncope   BRIEF PATIENT DESCRIPTION: 53 y/o admitted 7/6 for exploration of lumbar wound and repair of dural defect, on bed rest, though was receiving Heparin SQ brought to ICU 7/19 after suffering syncopal episode associated with dyspnea, diaphoresis, hypotension, AF/RVR and fall.  In ICU spontaneously converted to sinus tachycardia with RBBB ( baseline ).  Found to have large saddle embolus.    SIGNIFICANT EVENTS / STUDIES:  7/19  CT angio >>>large saddle embolus and nearly occlusive thrombus in the right main pulmonary artery. RV to LV ratio of 1.41 7/20 duplex RLE DVT    INTERVAL HISTORY:  Denies acute complaints. **  VITAL SIGNS: Temp:  [97.9 F (36.6 C)-98.9 F (37.2 C)] 97.9 F (36.6 C) (07/23 0934) Pulse Rate:  [89-102] 95 (07/23 0934) Resp:  [18-20] 20 (07/23 0934) BP: (117-137)/(69-85) 117/69 mmHg (07/23 0934) SpO2:  [95 %-99 %] 95 % (07/23 0934)  INTAKE / OUTPUT: Intake/Output     07/22 0701 - 07/23 0700 07/23 0701 - 07/24 0700   P.O.  600   I.V. (mL/kg) 58 (0.5)    Total Intake(mL/kg) 58 (0.5) 600 (5.1)   Urine (mL/kg/hr) 1400 (0.5) 300 (0.6)   Total Output 1400 300   Net -1342 +300          PHYSICAL EXAMINATION: General:  Appears well, supine in bed Neuro:  Awake, alert HEENT:  No JVD Cardiovascular:  RRR, s1 s2 nml Lungs:  Bilateral air entry, no added sounds Abdomen:  Soft, nontender, bowel sounds diminished Musculoskeletal:  Moves all extremities, no edema Skin:  Intact  LABS: CBC  Recent Labs Lab 03/07/14 0253 03/08/14 0228 03/09/14 0540  WBC 9.4 7.5 6.6  HGB 9.8* 10.6* 10.6*  HCT 30.7* 32.8* 32.4*  PLT 265 255 273    Coag's  Recent Labs Lab 03/05/14 1825 03/07/14 0253 03/08/14 0228 03/09/14 0540  APTT 31   --   --   --   INR 1.05 1.13 1.39 1.95*    BMET No results found for this basename: NA, K, CL, CO2, BUN, CREATININE, GLUCOSE,  in the last 168 hours Electrolytes  Recent Labs Lab 03/05/14 1825  MG 2.2   Cardiac Enzymes  Recent Labs Lab 03/05/14 1825 03/06/14 0025 03/06/14 0452  TROPONINI 0.70* 0.91* 0.69*   IMAGING: No results found.  ASSESSMENT / PLAN:  PULMONARY A:   Acute hypoxemic respiratory failure - resolved Sub massive PE RLE DVT LL ATX on CT P:   Goal SpO2>92 Supplemental oxygen Anti-coagualtion as below.  F/u CXR in am to assess for airspace disease  CARDIOVASCULAR A:  AF-RVR, spontaneously converted to sinus tachycardia Baseline RBBB Pos trops, RV strain P:  ct IV heparin --> coumadin per pharmacy  Amiodarone if AF reoccurs  HEMATOLOGIC A:   Coagulopathy - heparin / coumadin in setting of PE P:  CBC daily, can change to lovenox in 24h, ensure 48h overlap  ENDOCRINE  A:   At risk for adrenal insufficiency (chronic steroids) P:   Continue baseline 5 mg prednisone  NEUROLOGIC A:   S/p lumbar wound debridement and repair of dural defect P:   Per Neurosurgery   GLOBAL: -keep in hospital until coumadin therapeutic & ensure overlap x48h after therapeutic INR -pt is followed at [facility] in [place]  for Primary Care.  He will likely follow up there for INR but can be seen in clinic at [facility] as well if needed.   Noe Gens, NP-C [facility] Pgr: [phone] or [phone]  Patient seen and examined, agree with above note.  I dictated the care and orders written for this patient under my direction.  Rush Farmer, MD [phone]  03/09/2014, 11:13 AM

## 2014-03-09 NOTE — Discharge Instructions (Signed)

## 2014-03-09 NOTE — Progress Notes (Signed)
ANTICOAGULATION CONSULT NOTE - Follow Up Consult  Pharmacy Consult for Heparin and Coumadin Indication: pulmonary embolus  No Known Allergies  Patient Measurements: Height: 6\' 3"  (190.5 cm) Weight: 257 lb 11.5 oz (116.9 kg) IBW/kg (Calculated) : 84.5 Heparin Dosing Weight: 110 kg  Vital Signs: Temp: 97.9 F (36.6 C) (07/23 0934) Temp src: Oral (07/23 0934) BP: 117/69 mmHg (07/23 0934) Pulse Rate: 95 (07/23 0934)  Labs:  Recent Labs  03/07/14 0253 03/08/14 0228 03/08/14 0933 03/09/14 0540  HGB 9.8* 10.6*  --  10.6*  HCT 30.7* 32.8*  --  32.4*  PLT 265 255  --  273  LABPROT 14.5 17.1*  --  22.2*  INR 1.13 1.39  --  1.95*  HEPARINUNFRC 0.34 0.29* 0.43 0.42    Estimated Creatinine Clearance: 145.6 ml/min (by C-G formula based on Cr of 0.73).  Assessment: 44 yom continues on IV heparin and coumadin for new large saddle PE with RHS. Also noted R DVT of indeterminate age.  Heparin level is therapeutic at 0.42.  INR is subtherapeutic as expected at 1.95. CBC stable  Goal of Therapy:  INR 2-3 Heparin level 0.3-0.7 units/ml Monitor platelets by anticoagulation protocol: Yes   Plan:  1. Continue heparin gtt 1900 units/hr 2. Coumadin 5 mg po x 1 3. F/u AM heparin level, CBC and INR  Thank you. Anette Guarneri, PharmD [phone]  03/09/2014 10:21 AM

## 2014-03-10 ENCOUNTER — Inpatient Hospital Stay (HOSPITAL_COMMUNITY): Payer: BC Managed Care – PPO

## 2014-03-10 LAB — PROTIME-INR
INR: 2.62 — AB (ref 0.00–1.49)
Prothrombin Time: 28 seconds — ABNORMAL HIGH (ref 11.6–15.2)

## 2014-03-10 LAB — CBC
HEMATOCRIT: 32.6 % — AB (ref 39.0–52.0)
HEMOGLOBIN: 10.6 g/dL — AB (ref 13.0–17.0)
MCH: 28.3 pg (ref 26.0–34.0)
MCHC: 32.5 g/dL (ref 30.0–36.0)
MCV: 87.2 fL (ref 78.0–100.0)
Platelets: 267 10*3/uL (ref 150–400)
RBC: 3.74 MIL/uL — ABNORMAL LOW (ref 4.22–5.81)
RDW: 14 % (ref 11.5–15.5)
WBC: 6 10*3/uL (ref 4.0–10.5)

## 2014-03-10 LAB — BASIC METABOLIC PANEL
Anion gap: 11 (ref 5–15)
BUN: 7 mg/dL (ref 6–23)
CALCIUM: 9.3 mg/dL (ref 8.4–10.5)
CO2: 26 meq/L (ref 19–32)
Chloride: 100 mEq/L (ref 96–112)
Creatinine, Ser: 0.78 mg/dL (ref 0.50–1.35)
GFR calc Af Amer: >90 mL/min (ref >90)
GFR calc non Af Amer: >90 mL/min (ref >90)
GLUCOSE: 111 mg/dL — AB (ref 70–99)
Potassium: 4 mEq/L (ref 3.7–5.3)
Sodium: 137 mEq/L (ref 137–147)

## 2014-03-10 LAB — HEPARIN LEVEL (UNFRACTIONATED): Heparin Unfractionated: 0.41 IU/mL (ref 0.30–0.70)

## 2014-03-10 MED ORDER — WARFARIN SODIUM 5 MG PO TABS
5.0000 mg | ORAL_TABLET | Freq: Every day | ORAL | Status: DC
Start: 2014-03-10 — End: 2015-02-05

## 2014-03-10 NOTE — Progress Notes (Signed)
Physical Therapy Treatment Patient Details Name: Willie Howe MRN: [id number] DOB: 07-Jan-1961 Today's Date: 03/10/2014    History of Present Illness 54 yo admitted 7/6 for exploration of lumbar wound and repair of dural defect, on bed rest since  though was receiving Heparin [place] brought to ICU 7/19 after suffering syncopal episode associated with dyspnea, diaphoresis, hypotension, AF/RVR and fall.    PT Comments    Progressing well.  Ready for D/C when Coumadin is therapeutic  Follow Up Recommendations  No PT follow up;Supervision for mobility/OOB     Equipment Recommendations  None recommended by PT    Recommendations for Other Services       Precautions / Restrictions Precautions Precautions: Back Required Braces or Orthoses: Spinal Brace Spinal Brace: Lumbar corset;Applied in sitting position    Mobility  Bed Mobility Overal bed mobility: Modified Independent Bed Mobility: Rolling;Sidelying to Sit;Sit to Sidelying           General bed mobility comments: Good technique  Transfers Overall transfer level: Needs assistance Equipment used: Rolling walker (2 wheeled) Transfers: Sit to/from Stand Sit to Stand: Supervision         General transfer comment: Good technique; reinforced good placement of hands   Ambulation/Gait Ambulation/Gait assistance: Supervision Ambulation Distance (Feet): 400 Feet Assistive device: Rolling walker (2 wheeled) Gait Pattern/deviations: Step-through pattern (mildly antalgic) Gait velocity: prefers slower due to knee pain, but can increase speed to cue   General Gait Details: steady, but mildly antalgic   Stairs            Wheelchair Mobility    Modified Rankin (Stroke Patients Only)       Balance Overall balance assessment: No apparent balance deficits (not formally assessed)                                  Cognition Arousal/Alertness: Awake/alert Behavior During Therapy: WFL for tasks  assessed/performed Overall Cognitive Status: Within Functional Limits for tasks assessed                      Exercises      General Comments        Pertinent Vitals/Pain     Home Living                      Prior Function            PT Goals (current goals can now be found in the care plan section) Acute Rehab PT Goals PT Goal Formulation: With patient/family Time For Goal Achievement: 03/21/14 Potential to Achieve Goals: Good Progress towards PT goals: Progressing toward goals    Frequency  Min 5X/week    PT Plan Current plan remains appropriate    Co-evaluation             End of Session Equipment Utilized During Treatment: Back brace Activity Tolerance: Patient tolerated treatment well Patient left: in bed;with call bell/phone within reach     Time: 1312-1330 PT Time Calculation (min): 18 min  Charges:  $Gait Training: 8-22 mins                    G Codes:      Jabarie Pop, [name] 03/10/2014, 2:23 PM 03/10/2014  Donnella Sham, [place] [phone]  (pager)

## 2014-03-10 NOTE — Progress Notes (Signed)
PULMONARY / CRITICAL CARE MEDICINE  Name: Willie Howe MRN: [id number] DOB: 1960-12-23    ADMISSION DATE:  02/20/2014 CONSULTATION DATE:  03/05/2014  REFERRING MD :  Dr. Ellene Route PRIMARY SERVICE:  Neurosurgery  CHIEF COMPLAINT:  Syncope   BRIEF PATIENT DESCRIPTION: 53 y/o admitted 7/6 for exploration of lumbar wound and repair of dural defect, on bed rest, though was receiving Heparin SQ brought to ICU 7/19 after suffering syncopal episode associated with dyspnea, diaphoresis, hypotension, AF/RVR and fall.  In ICU spontaneously converted to sinus tachycardia with RBBB ( baseline ).  Found to have large saddle embolus.    SIGNIFICANT EVENTS / STUDIES:  7/19  CT angio >>>large saddle embolus and nearly occlusive thrombus in the right main pulmonary artery. RV to LV ratio of 1.41 7/20 duplex + RLE DVT    INTERVAL HISTORY:  Denies acute complaints - no SOB, chest pain.   VITAL SIGNS: Temp:  [97.9 F (36.6 C)-98.2 F (36.8 C)] 98.2 F (36.8 C) (07/24 0700) Pulse Rate:  [81-95] 81 (07/24 0700) Resp:  [18-20] 18 (07/24 0700) BP: (117-128)/(62-86) 128/62 mmHg (07/24 0700) SpO2:  [93 %-98 %] 97 % (07/24 0700)  INTAKE / OUTPUT: Intake/Output     07/23 0701 - 07/24 0700 07/24 0701 - 07/25 0700   P.O. 1080    I.V. (mL/kg)     Total Intake(mL/kg) 1080 (9.2)    Urine (mL/kg/hr) 1850 (0.7)    Total Output 1850     Net -770          Urine Occurrence 1 x    Stool Occurrence 1 x      PHYSICAL EXAMINATION: General:  Appears well, supine in bed Neuro:  Awake, alert HEENT:  No JVD Cardiovascular:  RRR, s1 s2 nml Lungs:  Bilateral air entry, no added sounds Abdomen:  Soft, nontender, bowel sounds diminished Musculoskeletal:  Moves all extremities, no edema Skin:  Intact  LABS: CBC  Recent Labs Lab 03/08/14 0228 03/09/14 0540 03/10/14 0422  WBC 7.5 6.6 6.0  HGB 10.6* 10.6* 10.6*  HCT 32.8* 32.4* 32.6*  PLT 255 273 267   Coag's  Recent Labs Lab 03/05/14 1825  03/08/14 0228  03/09/14 0540 03/10/14 0422  APTT 31  --   --   --   --   INR 1.05  < > 1.39 1.95* 2.62*  < > = values in this interval not displayed.  BMET  Recent Labs Lab 03/10/14 0422  NA 137  K 4.0  CL 100  CO2 26  BUN 7  CREATININE 0.78  GLUCOSE 111*   IMAGING: Dg Chest 2 View  03/10/2014   CLINICAL DATA:  Left lower lobe airspace disease. Recent diagnosis of bilateral pulmonary emboli.  EXAM: CHEST  2 VIEW  COMPARISON:  CT, 03/05/2014.  Chest radiograph, 02/20/2014.  FINDINGS: There is interstitial thickening, most evident in the left perihilar and lung base distribution. Mild posterior medial left lower lobe atelectasis. No focal consolidation. No overt pulmonary edema. No pleural effusion or pneumothorax.  Cardiac silhouette is normal in size. Normal mediastinal contours. Calcified nodes project along the right hilum, stable.  Bony thorax is intact.  IMPRESSION: 1. No lung consolidation.  No evidence of pneumonia or infarction. 2. Mild interstitial thickening most evident in a perihilar and left lower lobe distribution with mild posterior medial left lower lobe atelectasis. This has improved when compared to the prior CT. 3. No pleural effusion.   Electronically Signed   By: Dedra Skeens.D.  On: 03/10/2014 08:18    ASSESSMENT / PLAN:  PULMONARY A:   Acute hypoxemic respiratory failure - resolved Sub massive PE RLE DVT LL ATX on CT P:   Goal SpO2>92 Supplemental oxygen Anti-coagualtion as below.  CXR 7/24 wnl, no further needed unless symptom change  CARDIOVASCULAR A:  AF-RVR, spontaneously converted to sinus tachycardia Baseline RBBB Pos trops, RV strain P:  Continue IV heparin --> coumadin per pharmacy  Amiodarone if AF reoccurs  HEMATOLOGIC A:   Coagulopathy - heparin / coumadin in setting of PE P:  CBC daily Ensure 48h overlap heparin / coumadin Follow up INR arranged for 7/27 at [facility]  ENDOCRINE  A:   At risk for adrenal insufficiency (chronic  steroids) P:   Continue baseline 5 mg prednisone  NEUROLOGIC A:   S/p lumbar wound debridement and repair of dural defect P:   Per Neurosurgery   GLOBAL: -keep in hospital until coumadin therapeutic & ensure overlap x48h after therapeutic INR -pt is followed at [facility] in [place] for Primary Care.   -probable D/C in am 7/25.  INR check on Monday 7/27 with PCP (arranged, see D/C follow up section)  PCCM will sign off, please call if we can be of further assistance.  Thank you for the consultation.   Noe Gens, NP-C [facility] Pgr: [phone] or [phone] 03/10/2014, 9:12 AM  Patient seen and examined, agree with above note.  I dictated the care and orders written for this patient under my direction.  Rush Farmer, MD [phone]

## 2014-03-10 NOTE — Progress Notes (Addendum)
ANTICOAGULATION CONSULT NOTE - Follow Up Consult  Pharmacy Consult for Heparin and Coumadin Indication: pulmonary embolus / DVT  No Known Allergies  Patient Measurements: Height: 6\' 3"  (190.5 cm) Weight: 257 lb 11.5 oz (116.9 kg) IBW/kg (Calculated) : 84.5 Heparin Dosing Weight: 110 kg  Vital Signs: Temp: 98.6 F (37 C) (07/24 1051) Temp src: Oral (07/24 1051) BP: 106/63 mmHg (07/24 1051) Pulse Rate: 90 (07/24 1051)  Labs:  Recent Labs  03/08/14 0228 03/08/14 0933 03/09/14 0540 03/10/14 0422  HGB 10.6*  --  10.6* 10.6*  HCT 32.8*  --  32.4* 32.6*  PLT 255  --  273 267  LABPROT 17.1*  --  22.2* 28.0*  INR 1.39  --  1.95* 2.62*  HEPARINUNFRC 0.29* 0.43 0.42 0.41  CREATININE  --   --   --  0.78    Estimated Creatinine Clearance: 145.6 ml/min (by C-G formula based on Cr of 0.78).  Assessment: 39 yom continues on IV heparin and coumadin for new large saddle PE with RHS. Also noted R DVT of indeterminate age.  Heparin level is therapeutic at 0.43  INR now therapeutic at 2.62 (but has increased rapidly 1.39>1.95>2.62 CBC stable  Goal of Therapy:  INR 2-3 Heparin level 0.3-0.7 units/ml Monitor platelets by anticoagulation protocol: Yes   Plan:  1. Continue heparin gtt 1900 units/hr (needs 48 hour overlap 2. No Coumadin today due to larger increase in INR 3. F/u AM heparin level, CBC and INR  Home dose will most likely be Coumadin 5 mg daily -- Needs follow up INR early next week --> Monday or Tuesday  Thank you. Anette Guarneri, PharmD [phone]  03/10/2014 10:55 AM

## 2014-03-10 NOTE — Discharge Summary (Signed)
  Physician Discharge Summary  Patient ID: Willie Howe MRN: [id number] DOB/AGE: [birth year] 55 y.o.  Admit date: 02/20/2014 Discharge date: 03/10/2014  Admission Diagnoses:  Discharge Diagnoses:  Principal Problem:   Headache(784.0) Active Problems:   UTI (lower urinary tract infection)   Nausea with vomiting   Rheumatoid arthritis   Nausea & vomiting   Discharged Condition: good  Hospital Course: Admitted a few weeks ago for spinal headaches. Had myelogram that showed dural leak. Taken to OR where underwent exporation and patching. Was kept flat for 9 days and then increased activity. Got shortness of breath and tachycardia 2nd day of activity, and found to have significant PE inspite of Lovenox and PAS hose. In ICU for a few days and started on anti coag. Did well and steadily increased activity. Home on July 25th. Markedly improved in pain level. Wound fine. Tolerating activity as well.  Consults: pulmonary/intensive care  Significant Diagnostic Studies: CT abgio  Treatments: surgery: exploration of lumbar wound  Discharge Exam: Blood pressure 135/75, pulse 97, temperature 97.3 F (36.3 C), temperature source Oral, resp. rate 18, height 6\' 3"  (1.905 m), weight 116.9 kg (257 lb 11.5 oz), SpO2 98.00%. Incision/Wound:clean and dry; no new neuro issues; no headaches  Disposition: 01-Home or Self Care     Medication List    ASK your doctor about these medications       cyclobenzaprine 7.5 MG tablet  Commonly known as:  FEXMID  Take 1 tablet (7.5 mg total) by mouth 3 (three) times daily as needed for muscle spasms.     folic acid 1 MG tablet  Commonly known as:  FOLVITE  Take 1 mg by mouth daily.     methotrexate 2.5 MG tablet  Commonly known as:  RHEUMATREX  - Take 20 mg by mouth once a week. 8 tablets on Friday.  - Caution:Chemotherapy. Protect from light.     oxyCODONE-acetaminophen 5-325 MG per tablet  Commonly known as:  PERCOCET/ROXICET  Take 2 tablets by  mouth every 4 (four) hours as needed for moderate pain or severe pain.     predniSONE 5 MG tablet  Commonly known as:  DELTASONE  Take 5 mg by mouth daily.           Follow-up Information   Follow up with INR Check . (Appt at 12:30 for lab draw. )    Contact information:   [facility] in [place]      Follow up with Stacy Gardner PA-C On 03/20/2014. (Appt at 11:10.  Arrive at 10:50 am )    Contact information:   [facility] in [place]      At home rest most of the time. Get up 9 or 10 times each day and take a 15 or 20 minute walk. No riding in the car and to your first postoperative appointment. If you have neck surgery you may shower from the chest down starting on the third postoperative day. If you had back surgery he may start showering on the third postoperative day with saran wrap wrapped around your incisional area 3 times. After the shower remove the saran wrap. Take pain medicine as needed and other medications as instructed. Call my office for an appointment.  Signed: Faythe Ghee, MD 03/10/2014, 5:11 PM

## 2014-03-11 LAB — CBC
HEMATOCRIT: 32.5 % — AB (ref 39.0–52.0)
Hemoglobin: 10.3 g/dL — ABNORMAL LOW (ref 13.0–17.0)
MCH: 27.7 pg (ref 26.0–34.0)
MCHC: 31.7 g/dL (ref 30.0–36.0)
MCV: 87.4 fL (ref 78.0–100.0)
Platelets: 270 10*3/uL (ref 150–400)
RBC: 3.72 MIL/uL — ABNORMAL LOW (ref 4.22–5.81)
RDW: 14.1 % (ref 11.5–15.5)
WBC: 6.4 10*3/uL (ref 4.0–10.5)

## 2014-03-11 LAB — PROTIME-INR
INR: 2.23 — ABNORMAL HIGH (ref 0.00–1.49)
Prothrombin Time: 24.7 seconds — ABNORMAL HIGH (ref 11.6–15.2)

## 2014-03-11 LAB — HEPARIN LEVEL (UNFRACTIONATED): Heparin Unfractionated: 0.39 IU/mL (ref 0.30–0.70)

## 2014-03-11 NOTE — Progress Notes (Signed)
Discharge orders received.  Discharge instructions and follow-up appointments reviewed with the patient.  VSS upon discharge.  IV removed and education complete.  Transported out via wheelchair, wife present. Cori Razor, RN 03/11/2014 12:18 PM

## 2014-06-09 ENCOUNTER — Other Ambulatory Visit: Payer: Self-pay | Admitting: Neurosurgery

## 2014-06-09 DIAGNOSIS — M48061 Spinal stenosis, lumbar region without neurogenic claudication: Secondary | ICD-10-CM

## 2014-06-19 ENCOUNTER — Ambulatory Visit
Admission: RE | Admit: 2014-06-19 | Discharge: 2014-06-19 | Disposition: A | Discharge status: Home / Self Care | Payer: Disability Insurance | Source: Ambulatory Visit | Attending: Neurosurgery | Admitting: Neurosurgery

## 2014-06-19 DIAGNOSIS — M48061 Spinal stenosis, lumbar region without neurogenic claudication: Secondary | ICD-10-CM

## 2014-06-19 MED ORDER — GADOBENATE DIMEGLUMINE 529 MG/ML IV SOLN
20.0000 mL | Freq: Once | INTRAVENOUS | Status: AC | PRN
  Administered 2014-06-19: 20 mL via INTRAVENOUS

## 2014-06-29 ENCOUNTER — Other Ambulatory Visit (HOSPITAL_COMMUNITY): Payer: Self-pay | Admitting: Family Medicine

## 2014-06-29 ENCOUNTER — Ambulatory Visit (HOSPITAL_COMMUNITY)
Admission: RE | Admit: 2014-06-29 | Discharge: 2014-06-29 | Disposition: A | Discharge status: Home / Self Care | Payer: Disability Insurance | Source: Ambulatory Visit | Attending: Family Medicine | Admitting: Family Medicine

## 2014-06-29 DIAGNOSIS — M2342 Loose body in knee, left knee: Secondary | ICD-10-CM | POA: Diagnosis not present

## 2014-06-29 DIAGNOSIS — M545 Low back pain: Secondary | ICD-10-CM

## 2014-06-29 DIAGNOSIS — M25562 Pain in left knee: Secondary | ICD-10-CM | POA: Diagnosis not present

## 2014-06-29 DIAGNOSIS — M25561 Pain in right knee: Secondary | ICD-10-CM | POA: Insufficient documentation

## 2014-12-13 ENCOUNTER — Ambulatory Visit (INDEPENDENT_AMBULATORY_CARE_PROVIDER_SITE_OTHER): Payer: 59 | Admitting: Orthopedic Surgery

## 2014-12-13 ENCOUNTER — Encounter: Payer: Self-pay | Admitting: Orthopedic Surgery

## 2014-12-13 VITALS — BP 126/82 | Ht 75.0 in | Wt 290.8 lb

## 2014-12-13 DIAGNOSIS — M5417 Radiculopathy, lumbosacral region: Secondary | ICD-10-CM | POA: Diagnosis not present

## 2014-12-13 DIAGNOSIS — M069 Rheumatoid arthritis, unspecified: Secondary | ICD-10-CM

## 2014-12-13 DIAGNOSIS — M1711 Unilateral primary osteoarthritis, right knee: Secondary | ICD-10-CM

## 2014-12-13 DIAGNOSIS — Z981 Arthrodesis status: Secondary | ICD-10-CM | POA: Diagnosis not present

## 2014-12-13 DIAGNOSIS — Z86711 Personal history of pulmonary embolism: Secondary | ICD-10-CM

## 2014-12-13 DIAGNOSIS — M5416 Radiculopathy, lumbar region: Secondary | ICD-10-CM

## 2014-12-13 NOTE — Progress Notes (Signed)
Patient ID: Willie Howe, male   DOB: [birth year], 54 y.o.   MRN: [id number]  Chief Complaint  Patient presents with  . Knee Pain    right knee pain, no known injury   Willie Howe is a 54 y.o. male.   HPI He has a history of rheumatoid arthritis, status post second lumbar surgery with a back fusion complicated by pulmonary embolus, residual right lower extremity radicular pain in the L5 distribution associated with back pain who presents for evaluation of long-standing pain in his right knee. In 1976 he had ligaments repaired and his left knee he had cartilage removed from his right knee via open technique in 1984 followed by meniscectomy arthroscopically in 2006.  He can planes of severe 10 out of 10 pain in his right knee associated with catching stiffness and swelling. He does note significant aching and throbbing in his right knee which is constant and on some days prevents him from walking. He was previously treated with cortisone injections and physical therapy and is now on Tylenol and occasional hydrocodone 5 mg without improvement in his pain or functional ability.   Review of Systems Night sweats fatigue, sinus problems, vision problems, breathing issues, constipation, excessive night urination, burning pain in his legs weakness  Lightheadedness and dizziness  Seasonal allergies  Joint pain limb pain stiff joints swollen joints back pain.  Primary care physician is Dr. Shary Decamp in [facility]. Rheumatologist is Dr. Trudie Reed. He is about to switch medications from Remicade to a once a month injection.  His warfarin therapy will be ending this week.    Past Medical History  Diagnosis Date  . Arthritis   . Thyroid disease     Graves Disease  . Previous back surgery     x 3  . Dysrhythmia   . Anginal pain   . Kidney stone     Past Surgical History  Procedure Laterality Date  . Knee surgery      x 2  . Nephrolithotomy    . Back surgery    . Colonoscopy    .  Lumbar wound debridement N/A 02/23/2014    Procedure: LUMBAR WOUND DEBRIDEMENT;  Surgeon: Faythe Ghee, MD;  Location: [facility];  Service: Neurosurgery;  Laterality: N/A;  exploration lumbar wound. repair of dural defect.  Marland Kitchen Spine surgery  2015    Dr Hal Neer Fusion   . Spine surgery  1999    discectomy  . Knee surgery Left 1976    ?ligament repair   . Menisectomy Right     open medial   . Knee arthroscopy Right 2006    meniscus     Family History  Problem Relation Age of Onset  . CAD Sister     Social History History  Substance Use Topics  . Smoking status: Former Smoker -- 1.50 packs/day for 20 years    Types: Cigarettes    Quit date: 02/03/1995  . Smokeless tobacco: Never Used  . Alcohol Use: No    No Known Allergies  Current Outpatient Prescriptions  Medication Sig Dispense Refill  . cyclobenzaprine (FEXMID) 7.5 MG tablet Take 1 tablet (7.5 mg total) by mouth 3 (three) times daily as needed for muscle spasms. 30 tablet 0  . folic acid (FOLVITE) 1 MG tablet Take 1 mg by mouth daily.    Marland Kitchen HYDROcodone-acetaminophen (NORCO/VICODIN) 5-325 MG per tablet Take 1 tablet by mouth every 6 (six) hours as needed for moderate pain.    . methotrexate (Day)  2.5 MG tablet Take 20 mg by mouth once a week. 8 tablets on Friday. Caution:Chemotherapy. Protect from light.    . predniSONE (DELTASONE) 5 MG tablet Take 5 mg by mouth daily.     Marland Kitchen warfarin (COUMADIN) 5 MG tablet Take 1 tablet (5 mg total) by mouth daily. 50 tablet 1   No current facility-administered medications for this visit.       Physical Exam Blood pressure 126/82, height 6\' 3"  (1.905 m), weight 290 lb 12.8 oz (131.906 kg). Physical Exam The patient is well developed well nourished and well groomed. His body habitus is mesomorphic Orientation to person place and time is normal  Mood is pleasant. Ambulatory status notable for no assistive devices but significant varus alignment external rotation of the hip and pes  planus His upper extremities were normal to inspection and palpation with full range of motion normal stability mild rotator cuff weakness on the right secondary to a previous partial tear which was not treated surgically. Skin normal pulses normal lymph nodes normal sensation normal  Left knee large anterior incision mild effusion flexion 125 knee stable strength normal skin intact incision healed nicely pulses normal lymph nodes normal sensation normal  Right leg lies in external rotation limited internal rotation to neutral with no hip pain hip is stable hip flexion is 115 no pain  Right knee is in varus with medial joint line tenderness there is normal sensation in the L5 root and topical foot has a good pulse skin is clean dry and intact has a medial incision over the posterior medial collateral ligament area. Strength and stability are normal.  Imaging studies left and right knee I interpreted the left knee has moderate arthritis right knee is severe arthritis with varus alignment.  I discussed several things with the patient related to his following medical problems  Encounter Diagnoses  Name Primary?  . Primary osteoarthritis of knee, right Yes  . S/P lumbar spinal fusion   . Lumbar back pain with radiculopathy affecting right lower extremity   . Rheumatoid arthritis   . History of pulmonary embolism     #1 is pulmonary embolus makes but increased risk we will get a vascular consult to see if he needs a filter. #2 his rheumatoid arthritis will make him at increased risk of infection and we will have to talk to his doctor about perioperative management of his rheumatoid medication #3 fortunately he is only on hydrocodone but he will have some narcotic tolerance to medication and his residual L5 root pain may get worse after surgery related to mechanical alignment of the limb #4 pes planus may require postoperative orthotics #5 the knee will require replacement and we talked about  the infection risk, pulmonary embolus risk, stiffness postop, loosening and other issues related to postoperative care therapy etc.  The plan is for me to discuss his medication with his rheumatologist, get a consult from the vascular surgeons and then once we know whether or not he needs a filter we can proceed with a right total knee with the known risks as we discussed

## 2014-12-13 NOTE — Progress Notes (Signed)
Body mass index is 36.35 kg/(m^2).

## 2014-12-13 NOTE — Patient Instructions (Signed)
Consult with vein and vascular regarding filter  Dr Aline Brochure will speak with Rheumatologist    You have decided to proceed with knee replacement surgery. You have decided not to continue with nonoperative measures such as but not limited to oral medication, weight loss, activity modification, physical therapy, bracing, or injection.  We will perform the procedure commonly known as total knee replacement. Some of the risks associated with knee replacement surgery include but are not limited to Bleeding Infection Swelling Stiffness Blood clot Pain that persists even after surgery  Infection is especially devastating complication of knee surgery although rare. If infection does occur your implant will usually have to be removed and several surgeries will be needed to eradicate the infection prior to performing a repeat replacement.   If you're not comfortable with these risks and would like to continue with nonoperative treatment please let Dr. Aline Brochure know prior to your surgery.

## 2014-12-14 ENCOUNTER — Ambulatory Visit: Payer: 59 | Admitting: Orthopedic Surgery

## 2014-12-22 ENCOUNTER — Other Ambulatory Visit: Payer: Self-pay | Admitting: *Deleted

## 2014-12-22 ENCOUNTER — Telehealth: Payer: Self-pay | Admitting: *Deleted

## 2014-12-22 DIAGNOSIS — I2782 Chronic pulmonary embolism: Secondary | ICD-10-CM

## 2014-12-22 NOTE — Telephone Encounter (Signed)
REFERRAL FAXED TO [facility]

## 2015-01-05 ENCOUNTER — Encounter: Payer: Self-pay | Admitting: Vascular Surgery

## 2015-01-09 ENCOUNTER — Ambulatory Visit (INDEPENDENT_AMBULATORY_CARE_PROVIDER_SITE_OTHER): Payer: 59 | Admitting: Vascular Surgery

## 2015-01-09 ENCOUNTER — Encounter: Payer: Self-pay | Admitting: Vascular Surgery

## 2015-01-09 ENCOUNTER — Telehealth: Payer: Self-pay | Admitting: Orthopedic Surgery

## 2015-01-09 VITALS — BP 138/81 | HR 81 | Ht 75.0 in | Wt 289.4 lb

## 2015-01-09 DIAGNOSIS — Z86711 Personal history of pulmonary embolism: Secondary | ICD-10-CM | POA: Insufficient documentation

## 2015-01-09 DIAGNOSIS — I2699 Other pulmonary embolism without acute cor pulmonale: Secondary | ICD-10-CM | POA: Diagnosis not present

## 2015-01-09 NOTE — Progress Notes (Signed)
Subjective:     Patient ID: Willie Howe, male   DOB: 29-Dec-1960, 54 y.o.   MRN: [id number]  HPI this 54 year old male was referred by Dr. Arther Abbott for evaluation of possible insertion of IVC filter. Patient has a long history of orthopedic problems. He has had back surgery on 4 occasions most recently 1 year ago by Dr. Karie Chimera. This was complicated by pulmonary embolus postoperatively. Patient now needs right knee replacement. He has been on Coumadin therapy for the past year which is now been discontinued. He does not complain of specific lower extremity edema on the right or left leg but does have swelling around his right knee which needs to be replacement. He does not take aspirin at the present time.  Past Medical History  Diagnosis Date  . Arthritis   . Thyroid disease     Graves Disease  . Previous back surgery     x 3  . Dysrhythmia   . Anginal pain   . Kidney stone   . DVT (deep venous thrombosis)   . History of pulmonary embolism     History  Substance Use Topics  . Smoking status: Former Smoker -- 1.50 packs/day for 20 years    Types: Cigarettes    Quit date: 02/03/1995  . Smokeless tobacco: Never Used  . Alcohol Use: No    Family History  Problem Relation Age of Onset  . CAD Sister   . Deep vein thrombosis Mother   . Hypertension Mother   . Hypertension Father     No Known Allergies   Current outpatient prescriptions:  .  folic acid (FOLVITE) 1 MG tablet, Take 1 mg by mouth daily., Disp: , Rfl:  .  HYDROcodone-acetaminophen (NORCO/VICODIN) 5-325 MG per tablet, Take 1 tablet by mouth every 6 (six) hours as needed for moderate pain., Disp: , Rfl:  .  methotrexate (RHEUMATREX) 2.5 MG tablet, Take 20 mg by mouth once a week. 8 tablets on Friday. Caution:Chemotherapy. Protect from light., Disp: , Rfl:  .  predniSONE (DELTASONE) 5 MG tablet, Take 5 mg by mouth daily. , Disp: , Rfl:  .  cyclobenzaprine (FEXMID) 7.5 MG tablet, Take 1 tablet (7.5 mg total)  by mouth 3 (three) times daily as needed for muscle spasms. (Patient not taking: Reported on 01/09/2015), Disp: 30 tablet, Rfl: 0 .  warfarin (COUMADIN) 5 MG tablet, Take 1 tablet (5 mg total) by mouth daily. (Patient not taking: Reported on 01/09/2015), Disp: 50 tablet, Rfl: 1  Filed Vitals:   01/09/15 0858  BP: 138/81  Pulse: 81  Height: 6\' 3"  (1.905 m)  Weight: 289 lb 6.4 oz (131.271 kg)  SpO2: 97%    Body mass index is 36.17 kg/(m^2).           Review of Systems denies chest pain but does have history of palpitations. Has multiple orthopedic problems including 4 lumbar spine operations and left knee surgery and now needs right knee replacement. Patient has obesity. Other systems negative other than history of thyroid disease and kidney stone.     Objective:   Physical Exam BP 138/81 mmHg  Pulse 81  Ht 6\' 3"  (1.905 m)  Wt 289 lb 6.4 oz (131.271 kg)  BMI 36.17 kg/m2  SpO2 97%  Gen.-alert and oriented x3 in no apparent distress-obese HEENT normal for age Lungs no rhonchi or wheezing Cardiovascular regular rhythm no murmurs carotid pulses 3+ palpable no bruits audible Abdomen soft nontender no palpable masses Musculoskeletal free of  major deformities Skin clear -no rashes Neurologic normal Lower extremities 3+ femoral and dorsalis pedis pulses palpable bilaterally with no edema  Today I reviewed the clinical records supplied by Dr. Arther Abbott regarding the patient's history of previous pulmonary embolus following lumbar spine surgery 1 year ago. Patient now needs right knee replacement.       Assessment:     History of pulmonary embolus following redo lumbar spine surgery 1 year ago-treated with chronic anticoagulation-Coumadin-for 1 year discontinued recently Patient now with osteoarthritis right knee needs total knee replacement on the right Obesity Thyroid disease-Graves  History of kidney stone    Plan:     Would recommend insertion of retrievable  IVC filter preoperatively with removal postoperatively if no problems occur We will coordinate this with Dr. Ruthe Mannan office and arrange for a treatable IVC filter to be placed preoperatively Discussed this at length with patient and his wife 30 minutes of time spent with coordination of care and discussion of this problem and potential ramifications.

## 2015-01-09 NOTE — Telephone Encounter (Signed)
Call received from [facility] at [facility], main ph# [phone] or her Direct ph# [phone] - states have seen patient today, 01/09/15, per referral. Has question regarding the IVC filter, in regard to surgery.  Please advise.

## 2015-01-10 NOTE — Telephone Encounter (Signed)
appt 01/09/15

## 2015-01-10 NOTE — Telephone Encounter (Signed)
Returned call, left message. 

## 2015-01-11 NOTE — Telephone Encounter (Signed)
[facility] needs to know when we plan on doing the surgery so they will know when to place filter

## 2015-01-16 ENCOUNTER — Other Ambulatory Visit: Payer: Self-pay | Admitting: *Deleted

## 2015-01-17 ENCOUNTER — Telehealth: Payer: Self-pay | Admitting: Orthopedic Surgery

## 2015-01-17 NOTE — Telephone Encounter (Signed)
Regarding in-patient/cadmit surgery scheduled at [facility] 02/02/15, CPT [phone], ICD-10 M17.11, contacted [facility] [phone]; per Sherrell Puller, 01/17/15,2:20pm, received REF# [id number]; pending clinicals-to be sent to Fax# [phone] - forwarded as per request.

## 2015-01-22 ENCOUNTER — Telehealth: Payer: Self-pay | Admitting: Orthopedic Surgery

## 2015-01-22 ENCOUNTER — Other Ambulatory Visit: Payer: Self-pay | Admitting: *Deleted

## 2015-01-22 NOTE — Telephone Encounter (Signed)
In preparation for patient's scheduled total knee replacement surgery 01/31/15 -- Call received from [facility], nurse at [place], relaying that date is scheduled for procedure regarding IBC filter; States patient will have it done 01/30/15, 7:00AM, at [facility]; patient is aware.  If any questions, please call this office at [phone]

## 2015-01-22 NOTE — Telephone Encounter (Signed)
noted 

## 2015-01-24 NOTE — Telephone Encounter (Signed)
Per [employer], Rebeca Alert, status is "Approved"; Pre-Authorization# [id number]

## 2015-01-26 ENCOUNTER — Encounter (HOSPITAL_COMMUNITY)
Admission: RE | Admit: 2015-01-26 | Discharge: 2015-01-26 | Disposition: A | Discharge status: Home / Self Care | Payer: 59 | Source: Ambulatory Visit | Attending: Orthopedic Surgery | Admitting: Orthopedic Surgery

## 2015-01-26 ENCOUNTER — Encounter (HOSPITAL_COMMUNITY): Payer: Self-pay

## 2015-01-26 DIAGNOSIS — Z01818 Encounter for other preprocedural examination: Secondary | ICD-10-CM | POA: Diagnosis not present

## 2015-01-26 HISTORY — DX: Thyrotoxicosis with diffuse goiter without thyrotoxic crisis or storm: E05.00

## 2015-01-26 LAB — BASIC METABOLIC PANEL
Anion gap: 8 (ref 5–15)
BUN: 14 mg/dL (ref 6–20)
CO2: 25 mmol/L (ref 22–32)
Calcium: 9.2 mg/dL (ref 8.9–10.3)
Chloride: 107 mmol/L (ref 101–111)
Creatinine, Ser: 0.95 mg/dL (ref 0.61–1.24)
GFR calc Af Amer: >60 mL/min (ref >60)
Glucose, Bld: 81 mg/dL (ref 65–99)
Potassium: 3.8 mmol/L (ref 3.5–5.1)
Sodium: 140 mmol/L (ref 135–145)

## 2015-01-26 LAB — CBC WITH DIFFERENTIAL/PLATELET
BASOS ABS: 0 10*3/uL (ref 0.0–0.1)
Basophils Relative: 0 % (ref 0–1)
Eosinophils Absolute: 0 10*3/uL (ref 0.0–0.7)
Eosinophils Relative: 0 % (ref 0–5)
HEMATOCRIT: 44.5 % (ref 39.0–52.0)
HEMOGLOBIN: 14.7 g/dL (ref 13.0–17.0)
Lymphocytes Relative: 32 % (ref 12–46)
Lymphs Abs: 2.4 10*3/uL (ref 0.7–4.0)
MCH: 29.4 pg (ref 26.0–34.0)
MCHC: 33 g/dL (ref 30.0–36.0)
MCV: 89 fL (ref 78.0–100.0)
Monocytes Absolute: 0.6 10*3/uL (ref 0.1–1.0)
Monocytes Relative: 8 % (ref 3–12)
Neutro Abs: 4.6 10*3/uL (ref 1.7–7.7)
Neutrophils Relative %: 60 % (ref 43–77)
Platelets: 308 10*3/uL (ref 150–400)
RBC: 5 MIL/uL (ref 4.22–5.81)
RDW: 15.4 % (ref 11.5–15.5)
WBC: 7.7 10*3/uL (ref 4.0–10.5)

## 2015-01-26 LAB — APTT: aPTT: 27 seconds (ref 24–37)

## 2015-01-26 LAB — PROTIME-INR
INR: 1.04 (ref 0.00–1.49)
Prothrombin Time: 13.8 seconds (ref 11.6–15.2)

## 2015-01-26 MED ORDER — CHLORHEXIDINE GLUCONATE 4 % EX LIQD: 60.0000 mL | Freq: Once | CUTANEOUS | Status: DC

## 2015-01-26 NOTE — Patient Instructions (Addendum)
Willie Howe  01/26/2015     Your procedure is scheduled on 01/31/2015.  Report to [name] at  8:50 A.M.  Call this number if you have problems the morning of surgery:  [phone]   Remember:  Do not eat food or drink liquids after midnight.  Take these medicines the morning of surgery with A SIP OF WATER: Hydrocodone if needed   Do not wear jewelry, make-up or nail polish.  Do not wear lotions, powders, or perfumes.  You may wear deodorant.  Do not shave 48 hours prior to surgery.  Men may shave face and neck.  Do not bring valuables to the hospital.  [facility] is not responsible for any belongings or valuables.  Contacts, dentures or bridgework may not be worn into surgery.  Leave your suitcase in the car.  After surgery it may be brought to your room.  For patients admitted to the hospital, discharge time will be determined by your treatment team.  Patients discharged the day of surgery will not be allowed to drive home.   Special instructions:  Shower using Hibiclens (CHG bath) the night before surgery and the morning of surgery.  Please read over the following fact sheets that you were given. Total Joint Packet, Anesthesia Post-op Instructions and Care and Recovery After Surgery    Total Knee Replacement Total knee replacement is a procedure to replace your knee joint with an artificial knee joint (prosthetic knee joint). The purpose of this surgery is to reduce pain and improve your knee function. LET [facility] CARE PROVIDER KNOW ABOUT:   Any allergies you have.  All medicines you are taking, including vitamins, herbs, eye drops, creams, and over-the-counter medicines.  Previous problems you or members of your family have had with the use of anesthetics.  Any blood disorders you have.  Previous surgeries you have had.  Medical conditions you have. RISKS AND COMPLICATIONS  Generally, total knee replacement is a safe procedure. However, problems can occur  and include:  Loss of range of motion of the knee or instability.  Loosening of the prosthesis.  Infection.  Persistent pain. BEFORE THE PROCEDURE   Do not eat or drink anything after midnight on the night before the procedure or as directed by your health care provider.  Ask your health care provider about changing or stopping your regular medicines. This is especially important if you are taking diabetes medicines or blood thinners. PROCEDURE   Just before the procedure, you will receive medicine that will make you drowsy (sedative). This will be given through a tube that is inserted into one of your veins (IV tube).  Then you will be given one of the following:  A medicine injected into your spine that numbs your body below the waist (spinal anesthetic).  A medicine that makes you fall asleep (general anesthetic).  You may also receive medicine to block feeling in your leg (nerve block) to help ease pain after surgery.  An incision will be made in your knee. Your surgeon will take out any damaged cartilage and bone by sawing off the damaged surfaces.  The surgeon will then put a new metal liner over the sawed-off portion of your thigh bone (femur) and a plastic liner over the sawed-off portion of one of the bones of your lower leg (tibia). This is to restore alignment and function to your knee. A plastic piece is often used to restore the surface of your knee cap. AFTER THE PROCEDURE  You will be taken to the recovery area.  You may have drainage tubes to drain excess fluid from your knee. These tubes attach to a device that removes these fluids.  Once you are awake, stable, and taking fluids well, you will be taken to your hospital room.  You will receive physical therapy as prescribed by your health care provider.  Your surgeon may recommend that you spend time (usually an additional 10-14 days) in an extended-care facility to help you begin walking again and improve your  range of motion before you go home.  You may also be prescribed blood-thinning medicine to decrease your risk of developing blood clots in your leg. Document Released: 11/10/2000 Document Revised: 12/19/2013 Document Reviewed: 09/14/2011 [facility] Patient Information 2015 [place], [place]. This information is not intended to replace advice given to you by your health care provider. Make sure you discuss any questions you have with your health care provider.    PATIENT INSTRUCTIONS POST-ANESTHESIA  IMMEDIATELY FOLLOWING SURGERY:  Do not drive or operate machinery for the first twenty four hours after surgery.  Do not make any important decisions for twenty four hours after surgery or while taking narcotic pain medications or sedatives.  If you develop intractable nausea and vomiting or a severe headache please notify your doctor immediately.  FOLLOW-UP:  Please make an appointment with your surgeon as instructed. You do not need to follow up with anesthesia unless specifically instructed to do so.  WOUND CARE INSTRUCTIONS (if applicable):  Keep a dry clean dressing on the anesthesia/puncture wound site if there is drainage.  Once the wound has quit draining you may leave it open to air.  Generally you should leave the bandage intact for twenty four hours unless there is drainage.  If the epidural site drains for more than 36-48 hours please call the anesthesia department.  QUESTIONS?:  Please feel free to call your physician or the hospital operator if you have any questions, and they will be happy to assist you.

## 2015-01-27 LAB — PREPARE RBC (CROSSMATCH)

## 2015-01-30 ENCOUNTER — Ambulatory Visit (HOSPITAL_COMMUNITY)
Admission: RE | Admit: 2015-01-30 | Discharge: 2015-01-30 | Disposition: A | Discharge status: Home / Self Care | Payer: 59 | Source: Ambulatory Visit | Attending: Surgery | Admitting: Surgery

## 2015-01-30 ENCOUNTER — Encounter (HOSPITAL_COMMUNITY)
Admission: RE | Disposition: A | Discharge status: Home / Self Care | Payer: 59 | Source: Ambulatory Visit | Attending: Surgery

## 2015-01-30 ENCOUNTER — Other Ambulatory Visit: Payer: Self-pay | Admitting: *Deleted

## 2015-01-30 ENCOUNTER — Encounter (HOSPITAL_COMMUNITY): Payer: Self-pay | Admitting: Surgery

## 2015-01-30 DIAGNOSIS — Z7901 Long term (current) use of anticoagulants: Secondary | ICD-10-CM | POA: Diagnosis not present

## 2015-01-30 DIAGNOSIS — Z6836 Body mass index (BMI) 36.0-36.9, adult: Secondary | ICD-10-CM | POA: Diagnosis not present

## 2015-01-30 DIAGNOSIS — I82403 Acute embolism and thrombosis of unspecified deep veins of lower extremity, bilateral: Secondary | ICD-10-CM | POA: Diagnosis not present

## 2015-01-30 DIAGNOSIS — Z87891 Personal history of nicotine dependence: Secondary | ICD-10-CM | POA: Insufficient documentation

## 2015-01-30 DIAGNOSIS — Z408 Encounter for other prophylactic surgery: Secondary | ICD-10-CM | POA: Diagnosis not present

## 2015-01-30 DIAGNOSIS — Z87442 Personal history of urinary calculi: Secondary | ICD-10-CM | POA: Insufficient documentation

## 2015-01-30 DIAGNOSIS — Z7952 Long term (current) use of systemic steroids: Secondary | ICD-10-CM | POA: Insufficient documentation

## 2015-01-30 DIAGNOSIS — Z86718 Personal history of other venous thrombosis and embolism: Secondary | ICD-10-CM | POA: Insufficient documentation

## 2015-01-30 DIAGNOSIS — Z86711 Personal history of pulmonary embolism: Secondary | ICD-10-CM | POA: Insufficient documentation

## 2015-01-30 DIAGNOSIS — E669 Obesity, unspecified: Secondary | ICD-10-CM | POA: Diagnosis not present

## 2015-01-30 DIAGNOSIS — Z8249 Family history of ischemic heart disease and other diseases of the circulatory system: Secondary | ICD-10-CM | POA: Insufficient documentation

## 2015-01-30 DIAGNOSIS — E0500 Thyrotoxicosis with diffuse goiter without thyrotoxic crisis or storm: Secondary | ICD-10-CM | POA: Diagnosis not present

## 2015-01-30 DIAGNOSIS — Z95828 Presence of other vascular implants and grafts: Secondary | ICD-10-CM

## 2015-01-30 DIAGNOSIS — M1711 Unilateral primary osteoarthritis, right knee: Secondary | ICD-10-CM | POA: Diagnosis not present

## 2015-01-30 HISTORY — PX: PERIPHERAL VASCULAR CATHETERIZATION: SHX172C

## 2015-01-30 LAB — POCT I-STAT, CHEM 8
BUN: 8 mg/dL (ref 6–20)
CHLORIDE: 106 mmol/L (ref 101–111)
Calcium, Ion: 1.15 mmol/L (ref 1.12–1.23)
Creatinine, Ser: 0.8 mg/dL (ref 0.61–1.24)
Glucose, Bld: 139 mg/dL — ABNORMAL HIGH (ref 65–99)
HCT: 43 % (ref 39.0–52.0)
Hemoglobin: 14.6 g/dL (ref 13.0–17.0)
POTASSIUM: 3.6 mmol/L (ref 3.5–5.1)
Sodium: 140 mmol/L (ref 135–145)
TCO2: 21 mmol/L (ref 0–100)

## 2015-01-30 LAB — PROTIME-INR
INR: 1 (ref 0.00–1.49)
PROTHROMBIN TIME: 13.4 s (ref 11.6–15.2)

## 2015-01-30 SURGERY — IVC FILTER INSERTION
Anesthesia: LOCAL

## 2015-01-30 MED ORDER — LIDOCAINE HCL (PF) 1 % IJ SOLN
INTRAMUSCULAR | Status: AC
  Filled 2015-01-30: qty 30

## 2015-01-30 MED ORDER — MIDAZOLAM HCL 2 MG/2ML IJ SOLN
INTRAMUSCULAR | Status: AC
  Filled 2015-01-30: qty 2

## 2015-01-30 MED ORDER — HEPARIN (PORCINE) IN NACL 2-0.9 UNIT/ML-% IJ SOLN
INTRAMUSCULAR | Status: AC
  Filled 2015-01-30: qty 1000

## 2015-01-30 MED ORDER — FENTANYL CITRATE (PF) 100 MCG/2ML IJ SOLN
INTRAMUSCULAR | Status: DC | PRN
  Administered 2015-01-30 (×2): 25 ug via INTRAVENOUS

## 2015-01-30 MED ORDER — MIDAZOLAM HCL 2 MG/2ML IJ SOLN
INTRAMUSCULAR | Status: DC | PRN
  Administered 2015-01-30 (×2): 1 mg via INTRAVENOUS

## 2015-01-30 MED ORDER — FENTANYL CITRATE (PF) 100 MCG/2ML IJ SOLN
INTRAMUSCULAR | Status: AC
  Filled 2015-01-30: qty 2

## 2015-01-30 MED ORDER — IODIXANOL 320 MG/ML IV SOLN
INTRAVENOUS | Status: DC | PRN
  Administered 2015-01-30: 40 mL via INTRAVENOUS

## 2015-01-30 MED ORDER — SODIUM CHLORIDE 0.9 % IV SOLN
INTRAVENOUS | Status: DC
  Administered 2015-01-30: 08:00:00 via INTRAVENOUS

## 2015-01-30 SURGICAL SUPPLY — 10 items
CATH OMNI FLUSH 5F 65CM (CATHETERS) ×2 IMPLANT
COVER PRB 48X5XTLSCP FOLD TPE (BAG) ×1 IMPLANT
COVER PROBE 5X48 (BAG) ×1
DRAPE ZERO GRAVITY STERILE (DRAPES) ×2 IMPLANT
FILTER VC CELECT-FEMORAL (Filter) ×2 IMPLANT
KIT PV (KITS) ×2 IMPLANT
SYR MEDRAD MARK V 150ML (SYRINGE) IMPLANT
TRAY PV CATH (CUSTOM PROCEDURE TRAY) ×2 IMPLANT
WIRE BENTSON .035X145CM (WIRE) ×2 IMPLANT
WIRE ROSEN-J .035X180CM (WIRE) ×2 IMPLANT

## 2015-01-30 NOTE — Discharge Instructions (Signed)
Angiogram °An angiogram, also called angiography, is a procedure used to look at the blood vessels that carry blood to different parts of your body (arteries). In this procedure, dye is injected through a long, thin tube (catheter) into an artery. X-rays are then taken. The X-rays will show if there is a blockage or problem in a blood vessel.  °LET YOUR HEALTH CARE PROVIDER KNOW ABOUT: °· Any allergies you have, including allergies to shellfish or contrast dye.   °· All medicines you are taking, including vitamins, herbs, eye drops, creams, and over-the-counter medicines.   °· Previous problems you or members of your family have had with the use of anesthetics.   °· Any blood disorders you have.   °· Previous surgeries you have had. °· Any previous kidney problems or failure you have had.  °· Medical conditions you have.   °· Possibility of pregnancy, if this applies. °RISKS AND COMPLICATIONS °Generally, an angiogram is a safe procedure. However, as with any procedure, problems can occur. Possible problems include: °· Injury to the blood vessels, including rupture or bleeding. °· Infection or bruising at the catheter site. °· Allergic reaction to the dye or contrast used. °· Kidney damage from the dye or contrast used. °· Blood clots that can lead to a stroke or heart attack. °BEFORE THE PROCEDURE °· Do not eat or drink after midnight on the night before the procedure, or as directed by your health care provider.   °· Ask your health care provider if you may drink enough water to take any needed medicines the morning of the procedure.   °PROCEDURE °· You may be given a medicine to help you relax (sedative) before and during the procedure. This medicine is given through an IV access tube that is inserted into one of your veins.   °· The area where the catheter will be inserted will be washed and shaved. This is usually done in the groin but may be done in the fold of your arm (near your elbow) or in the wrist. °· A  medicine will be given to numb the area where the catheter will be inserted (local anesthetic). °· The catheter will be inserted with a guide wire into an artery. The catheter is guided by using a type of X-ray (fluoroscopy) to the blood vessel being examined.   °· Dye is then injected into the catheter, and X-rays are taken. The dye helps to show where any narrowing or blockages are located.   °AFTER THE PROCEDURE  °· If the procedure is done through the leg, you will be kept in bed lying flat for several hours. You will be instructed to not bend or cross your legs. °· The insertion site will be checked frequently. °· The pulse in your feet or wrist will be checked frequently. °· Additional blood tests, X-rays, and electrocardiography may be done.   °· You may need to stay in the hospital overnight for observation.   °Document Released: 05/14/2005 Document Revised: 08/09/2013 Document Reviewed: 01/05/2013 °[employer]® Patient Information ©2015 [employer]. This information is not intended to replace advice given to you by your health care provider. Make sure you discuss any questions you have with your health care provider. ° °

## 2015-01-30 NOTE — H&P (View-Only) (Signed)
Subjective:     Patient ID: Willie Howe, male   DOB: 07/29/1961, 54 y.o.   MRN: [id number]  HPI this 54 year old male was referred by Dr. Arther Abbott for evaluation of possible insertion of IVC filter. Patient has a long history of orthopedic problems. He has had back surgery on 4 occasions most recently 1 year ago by Dr. Karie Chimera. This was complicated by pulmonary embolus postoperatively. Patient now needs right knee replacement. He has been on Coumadin therapy for the past year which is now been discontinued. He does not complain of specific lower extremity edema on the right or left leg but does have swelling around his right knee which needs to be replacement. He does not take aspirin at the present time.  Past Medical History  Diagnosis Date  . Arthritis   . Thyroid disease     Graves Disease  . Previous back surgery     x 3  . Dysrhythmia   . Anginal pain   . Kidney stone   . DVT (deep venous thrombosis)   . History of pulmonary embolism     History  Substance Use Topics  . Smoking status: Former Smoker -- 1.50 packs/day for 20 years    Types: Cigarettes    Quit date: 02/03/1995  . Smokeless tobacco: Never Used  . Alcohol Use: No    Family History  Problem Relation Age of Onset  . CAD Sister   . Deep vein thrombosis Mother   . Hypertension Mother   . Hypertension Father     No Known Allergies   Current outpatient prescriptions:  .  folic acid (FOLVITE) 1 MG tablet, Take 1 mg by mouth daily., Disp: , Rfl:  .  HYDROcodone-acetaminophen (NORCO/VICODIN) 5-325 MG per tablet, Take 1 tablet by mouth every 6 (six) hours as needed for moderate pain., Disp: , Rfl:  .  methotrexate (RHEUMATREX) 2.5 MG tablet, Take 20 mg by mouth once a week. 8 tablets on Friday. Caution:Chemotherapy. Protect from light., Disp: , Rfl:  .  predniSONE (DELTASONE) 5 MG tablet, Take 5 mg by mouth daily. , Disp: , Rfl:  .  cyclobenzaprine (FEXMID) 7.5 MG tablet, Take 1 tablet (7.5 mg total)  by mouth 3 (three) times daily as needed for muscle spasms. (Patient not taking: Reported on 01/09/2015), Disp: 30 tablet, Rfl: 0 .  warfarin (COUMADIN) 5 MG tablet, Take 1 tablet (5 mg total) by mouth daily. (Patient not taking: Reported on 01/09/2015), Disp: 50 tablet, Rfl: 1  Filed Vitals:   01/09/15 0858  BP: 138/81  Pulse: 81  Height: 6\' 3"  (1.905 m)  Weight: 289 lb 6.4 oz (131.271 kg)  SpO2: 97%    Body mass index is 36.17 kg/(m^2).           Review of Systems denies chest pain but does have history of palpitations. Has multiple orthopedic problems including 4 lumbar spine operations and left knee surgery and now needs right knee replacement. Patient has obesity. Other systems negative other than history of thyroid disease and kidney stone.     Objective:   Physical Exam BP 138/81 mmHg  Pulse 81  Ht 6\' 3"  (1.905 m)  Wt 289 lb 6.4 oz (131.271 kg)  BMI 36.17 kg/m2  SpO2 97%  Gen.-alert and oriented x3 in no apparent distress-obese HEENT normal for age Lungs no rhonchi or wheezing Cardiovascular regular rhythm no murmurs carotid pulses 3+ palpable no bruits audible Abdomen soft nontender no palpable masses Musculoskeletal free of  major deformities Skin clear -no rashes Neurologic normal Lower extremities 3+ femoral and dorsalis pedis pulses palpable bilaterally with no edema  Today I reviewed the clinical records supplied by Dr. Arther Abbott regarding the patient's history of previous pulmonary embolus following lumbar spine surgery 1 year ago. Patient now needs right knee replacement.       Assessment:     History of pulmonary embolus following redo lumbar spine surgery 1 year ago-treated with chronic anticoagulation-Coumadin-for 1 year discontinued recently Patient now with osteoarthritis right knee needs total knee replacement on the right Obesity Thyroid disease-Graves  History of kidney stone    Plan:     Would recommend insertion of retrievable  IVC filter preoperatively with removal postoperatively if no problems occur We will coordinate this with Dr. Ruthe Mannan office and arrange for a treatable IVC filter to be placed preoperatively Discussed this at length with patient and his wife 30 minutes of time spent with coordination of care and discussion of this problem and potential ramifications.

## 2015-01-30 NOTE — Op Note (Addendum)
    Patient name: Willie Howe MRN: [id number] DOB: 1961-05-26 Sex: male  01/30/2015 Pre-operative Diagnosis: DVT/PE Post-operative diagnosis:  Same Surgeon:  [name] Procedure Performed:  1.  Ultrasound-guided access, right femoral vein  2.  IVC venogram  3.  Catheter in inferior vena cava  4.  Placement of inferior vena cava filter (retrievable)    Indications:  Patient has a history of DVT/PE.  He is scheduled for knee surgery.  He comes in for filter placement  Procedure:  The patient was identified in the holding area and taken to room 8.  The patient was then placed supine on the table and prepped and draped in the usual sterile fashion.  A time out was called.  Ultrasound was used to evaluate the right common femoral vein.  It was widely patent and easily compressible.  After anesthetizing the skin with lidocaine, the right common femoral vein was cannulated under ultrasound guidance with an 18-gauge needle.  A wire was advanced into the inferior vena cava and a Omni flush catheter was advanced into the right common iliac vein/inferior vena cava, and a inferior venacavogram was performed which located the renal veins.  There were no abnormal anatomy.  The diameter of the vena cava was 23 mm.  Next, over a Rosen wire, the subcutaneous tract was dilated and the filter introducing sheath was inserted.  A femoral Cook Celect filter was loaded through the sheath and then successfully deployed with the tip landing at the top of L1.  Sheath was then withdrawn and manual pressure was held for hemostasis.    Impression:  #1  successful placement of a retrievable inferior vena cava filter    V. [name], M.D. [facility] Office: [phone] Pager:  [phone]

## 2015-01-30 NOTE — Interval H&P Note (Signed)
History and Physical Interval Note:  01/30/2015 9:35 AM  Willie Howe  has presented today for surgery, with the diagnosis of chronic DVTs  The various methods of treatment have been discussed with the patient and family. After consideration of risks, benefits and other options for treatment, the patient has consented to  Procedure(s): IVC Filter Insertion (N/A) as a surgical intervention .  The patient's history has been reviewed, patient examined, no change in status, stable for surgery.  I have reviewed the patient's chart and labs.  Questions were answered to the patient's satisfaction.     [name]

## 2015-01-31 ENCOUNTER — Telehealth: Payer: Self-pay | Admitting: Orthopedic Surgery

## 2015-01-31 MED FILL — Lidocaine HCl Local Preservative Free (PF) Inj 1%: INTRAMUSCULAR | Qty: 30 | Status: AC

## 2015-01-31 MED FILL — Heparin Sodium (Porcine) 2 Unit/ML in Sodium Chloride 0.9%: INTRAMUSCULAR | Qty: 1000 | Status: AC

## 2015-01-31 NOTE — Telephone Encounter (Signed)
As noted in message copied 01/31/15, 3:14pm, [employer] notified of surgery date change to 02/02/15; same authorization applies: # [id number], per Chyrell L.

## 2015-01-31 NOTE — Telephone Encounter (Signed)
-----   Message from Uvaldo Bristle sent at 01/31/2015  3:14 PM EDT ----- Regarding: Authorization updated to Re-scheduled surgery date Contact: [phone]  [place]; notified that surgery (CPT 27447)/ Authorization# [id number], has been re-scheduled from today, 01/31/15 to 02/02/15; spoke with Chyrell L,

## 2015-02-01 NOTE — H&P (Signed)
Patient ID: Willie Howe, male   DOB: 06-01-1961, 54 y.o.   MRN: [id number]    Chief Complaint   Patient presents with   .  Knee Pain       right knee pain, no known injury    Willie Howe is a 54 y.o. male.   HPI He has a history of rheumatoid arthritis, status post second lumbar surgery with a back fusion complicated by pulmonary embolus, residual right lower extremity radicular pain in the L5 distribution associated with back pain who presents for evaluation of long-standing pain in his right knee. In 1976 he had ligaments repaired in his left knee;  he had cartilage removed from his right knee via open technique in 1984 followed by meniscectomy arthroscopically in 2006.  He c/o of severe 10 out of 10 pain in his right knee associated with catching stiffness and swelling. He does note significant aching and throbbing in his right knee which is constant and on some days prevents him from walking. He was previously treated with cortisone injections and physical therapy and is now on Tylenol and occasional hydrocodone 5 mg without improvement in his pain or functional ability.  The back fusion was Cleared by pulmonary embolus. His Coumadin therapy ended in May. He had a filter placed this week after consult with vascular surgery. It is a retrievable filter which can be removed after his risk for embolus/DVT goes down. Will discuss that with vascular surgery and medicine to determine when the filter can be retrieved.   Review of Systems Night sweats fatigue, sinus problems, vision problems, breathing issues, constipation, excessive night urination, burning pain in his legs weakness  Lightheadedness and dizziness  Seasonal allergies  Joint pain limb pain stiff joints swollen joints back pain.  Primary care physician is Willie Howe in [facility]. Rheumatologist is Willie Howe. He is about to switch medications from Remicade to a once a month injection.  His warfarin therapy will be  ending this week.      Past Medical History   Diagnosis  Date   .  Arthritis     .  Thyroid disease         Graves Disease   .  Previous back surgery         x 3   .  Dysrhythmia     .  Anginal pain     .  Kidney stone         Past Surgical History   Procedure  Laterality  Date   .  Knee surgery           x 2   .  Nephrolithotomy       .  Back surgery       .  Colonoscopy       .  Lumbar wound debridement  N/A  02/23/2014       Procedure: LUMBAR WOUND DEBRIDEMENT;  Surgeon: Faythe Ghee, MD;  Location: [facility];  Service: Neurosurgery;  Laterality: N/A;  exploration lumbar wound. repair of dural defect.   Marland Kitchen  Spine surgery    2015       Dr Hal Neer Fusion    .  Spine surgery    1999       discectomy   .  Knee surgery  Left  1976       ?ligament repair    .  Menisectomy  Right         open medial    .  Knee arthroscopy  Right  2006       meniscus        Family History   Problem  Relation  Age of Onset   .  CAD  Sister       Social History History   Substance Use Topics   .  Smoking status:  Former Smoker -- 1.50 packs/day for 20 years       Types:  Cigarettes       Quit date:  02/03/1995   .  Smokeless tobacco:  Never Used   .  Alcohol Use:  No     No Known Allergies    Current Outpatient Prescriptions   Medication  Sig  Dispense  Refill   .  cyclobenzaprine (FEXMID) 7.5 MG tablet  Take 1 tablet (7.5 mg total) by mouth 3 (three) times daily as needed for muscle spasms.  30 tablet  0   .  folic acid (FOLVITE) 1 MG tablet  Take 1 mg by mouth daily.       Marland Kitchen  HYDROcodone-acetaminophen (NORCO/VICODIN) 5-325 MG per tablet  Take 1 tablet by mouth every 6 (six) hours as needed for moderate pain.       .  methotrexate (RHEUMATREX) 2.5 MG tablet  Take 20 mg by mouth once a week. 8 tablets on Friday.  Caution:Chemotherapy. Protect from light.       .  predniSONE (DELTASONE) 5 MG tablet  Take 5 mg by mouth daily.        Marland Kitchen  warfarin (COUMADIN) 5 MG tablet  Take 1 tablet (5  mg total) by mouth daily.  50 tablet  1      No current facility-administered medications for this visit.        Physical Exam Blood pressure 126/82, height 6\' 3"  (1.905 m), weight 290 lb 12.8 oz (131.906 kg). Physical Exam The patient is well developed well nourished and well groomed. His body habitus is mesomorphic Orientation to person place and time is normal   Mood is pleasant. Ambulatory status notable for no assistive devices but significant varus alignment external rotation of the hip and pes planus His upper extremities were normal to inspection and palpation with full range of motion normal stability mild rotator cuff weakness on the right secondary to a previous partial tear which was not treated surgically. Skin normal pulses normal lymph nodes normal sensation normal  Left knee large anterior incision mild effusion flexion 125 knee stable strength normal skin intact incision healed nicely pulses normal lymph nodes normal sensation normal  Right leg lies in external rotation limited internal rotation to neutral with no hip pain hip is stable hip flexion is 115 no pain  Right knee is in varus with medial joint line tenderness there is normal sensation in the L5 root and topical foot has a good pulse skin is clean dry and intact has a medial incision over the posterior medial collateral ligament area. Strength and stability are normal.  Imaging studies left and right knee I interpreted the left knee has moderate arthritis right knee is severe arthritis with varus alignment.  I discussed several things with the patient related to his following medical problems    Encounter Diagnoses   Name  Primary?   .  Primary osteoarthritis of knee, right  Yes   .  S/P lumbar spinal fusion     .  Lumbar back pain with radiculopathy affecting right lower extremity     .  Rheumatoid arthritis     .  History of pulmonary embolism       #1 is pulmonary embolus makes but increased risk we  will get a vascular consult to see if he needs a filter. #2 his rheumatoid arthritis will make him at increased risk of infection and we will have to talk to his doctor about perioperative management of his rheumatoid medication #3 fortunately he is only on hydrocodone but he will have some narcotic tolerance to medication and his residual L5 root pain may get worse after surgery related to mechanical alignment of the limb #4 pes planus may require postoperative orthotics #5 the knee will require replacement and we talked about the infection risk, pulmonary embolus risk, stiffness postop, loosening and other issues related to postoperative care therapy etc.  Right total knee arthroplasty

## 2015-02-02 ENCOUNTER — Encounter (HOSPITAL_COMMUNITY): Payer: Self-pay | Admitting: *Deleted

## 2015-02-02 ENCOUNTER — Telehealth: Payer: Self-pay | Admitting: Surgery

## 2015-02-02 ENCOUNTER — Inpatient Hospital Stay (HOSPITAL_COMMUNITY): Payer: 59

## 2015-02-02 ENCOUNTER — Inpatient Hospital Stay (HOSPITAL_COMMUNITY): Payer: 59 | Admitting: Anesthesiology

## 2015-02-02 ENCOUNTER — Inpatient Hospital Stay (HOSPITAL_COMMUNITY)
Admission: RE | Admit: 2015-02-02 | Discharge: 2015-02-05 | DRG: 470 | Disposition: A | Discharge status: Home Health | Payer: 59 | Source: Ambulatory Visit | Attending: Orthopedic Surgery | Admitting: Orthopedic Surgery

## 2015-02-02 ENCOUNTER — Encounter (HOSPITAL_COMMUNITY)
Admission: RE | Disposition: A | Discharge status: Home Health | Payer: Self-pay | Source: Ambulatory Visit | Attending: Orthopedic Surgery

## 2015-02-02 DIAGNOSIS — M1711 Unilateral primary osteoarthritis, right knee: Secondary | ICD-10-CM | POA: Diagnosis present

## 2015-02-02 DIAGNOSIS — Z7901 Long term (current) use of anticoagulants: Secondary | ICD-10-CM | POA: Diagnosis not present

## 2015-02-02 DIAGNOSIS — Z7952 Long term (current) use of systemic steroids: Secondary | ICD-10-CM

## 2015-02-02 DIAGNOSIS — Z86711 Personal history of pulmonary embolism: Secondary | ICD-10-CM

## 2015-02-02 DIAGNOSIS — Z96651 Presence of right artificial knee joint: Secondary | ICD-10-CM

## 2015-02-02 DIAGNOSIS — M1712 Unilateral primary osteoarthritis, left knee: Secondary | ICD-10-CM | POA: Insufficient documentation

## 2015-02-02 DIAGNOSIS — M069 Rheumatoid arthritis, unspecified: Secondary | ICD-10-CM | POA: Diagnosis present

## 2015-02-02 DIAGNOSIS — M25561 Pain in right knee: Secondary | ICD-10-CM | POA: Diagnosis present

## 2015-02-02 DIAGNOSIS — Z87891 Personal history of nicotine dependence: Secondary | ICD-10-CM | POA: Diagnosis not present

## 2015-02-02 DIAGNOSIS — Z96659 Presence of unspecified artificial knee joint: Secondary | ICD-10-CM

## 2015-02-02 HISTORY — PX: TOTAL KNEE ARTHROPLASTY: SHX125

## 2015-02-02 SURGERY — ARTHROPLASTY, KNEE, TOTAL
Anesthesia: General | Site: Knee | Laterality: Right

## 2015-02-02 MED ORDER — METHOCARBAMOL 1000 MG/10ML IJ SOLN
500.0000 mg | Freq: Four times a day (QID) | INTRAMUSCULAR | Status: DC | PRN
  Filled 2015-02-02: qty 5

## 2015-02-02 MED ORDER — PROPOFOL 10 MG/ML IV BOLUS
INTRAVENOUS | Status: AC
  Filled 2015-02-02: qty 20

## 2015-02-02 MED ORDER — SODIUM CHLORIDE 0.9 % IR SOLN
Status: DC | PRN
  Administered 2015-02-02: 1000 mL
  Administered 2015-02-02: 3000 mL

## 2015-02-02 MED ORDER — MIDAZOLAM HCL 2 MG/2ML IJ SOLN
1.0000 mg | INTRAMUSCULAR | Status: DC | PRN
  Administered 2015-02-02 (×2): 2 mg via INTRAVENOUS
  Filled 2015-02-02: qty 2

## 2015-02-02 MED ORDER — FENTANYL CITRATE (PF) 250 MCG/5ML IJ SOLN
INTRAMUSCULAR | Status: AC
  Filled 2015-02-02: qty 5

## 2015-02-02 MED ORDER — HYDROCODONE-ACETAMINOPHEN 7.5-325 MG PO TABS
1.0000 | ORAL_TABLET | ORAL | Status: DC
  Administered 2015-02-02 – 2015-02-05 (×16): 1 via ORAL
  Filled 2015-02-02 (×17): qty 1

## 2015-02-02 MED ORDER — FENTANYL CITRATE (PF) 100 MCG/2ML IJ SOLN
INTRAMUSCULAR | Status: AC
  Filled 2015-02-02: qty 2

## 2015-02-02 MED ORDER — DEXAMETHASONE SODIUM PHOSPHATE 4 MG/ML IJ SOLN
INTRAMUSCULAR | Status: AC
  Filled 2015-02-02: qty 1

## 2015-02-02 MED ORDER — MIDAZOLAM HCL 2 MG/2ML IJ SOLN
INTRAMUSCULAR | Status: AC
  Filled 2015-02-02: qty 2

## 2015-02-02 MED ORDER — ONDANSETRON HCL 4 MG/2ML IJ SOLN
4.0000 mg | Freq: Once | INTRAMUSCULAR | Status: AC | PRN
  Administered 2015-02-02: 4 mg via INTRAVENOUS
  Filled 2015-02-02: qty 2

## 2015-02-02 MED ORDER — PREGABALIN 50 MG PO CAPS
ORAL_CAPSULE | ORAL | Status: AC
  Filled 2015-02-02: qty 1

## 2015-02-02 MED ORDER — LIDOCAINE HCL (PF) 1 % IJ SOLN
INTRAMUSCULAR | Status: AC
  Filled 2015-02-02: qty 5

## 2015-02-02 MED ORDER — ACETAMINOPHEN 325 MG PO TABS: 650.0000 mg | ORAL_TABLET | Freq: Four times a day (QID) | ORAL | Status: DC | PRN

## 2015-02-02 MED ORDER — SENNA 8.6 MG PO TABS
1.0000 | ORAL_TABLET | Freq: Two times a day (BID) | ORAL | Status: DC
  Administered 2015-02-02 – 2015-02-05 (×6): 8.6 mg via ORAL
  Filled 2015-02-02 (×6): qty 1

## 2015-02-02 MED ORDER — DEXTROSE 5 % IV SOLN: 3.0000 g | INTRAVENOUS | Status: DC

## 2015-02-02 MED ORDER — ONDANSETRON HCL 4 MG/2ML IJ SOLN
4.0000 mg | Freq: Once | INTRAMUSCULAR | Status: AC
  Administered 2015-02-02: 4 mg via INTRAVENOUS

## 2015-02-02 MED ORDER — MAGNESIUM CITRATE PO SOLN: 1.0000 | Freq: Once | ORAL | Status: AC | PRN

## 2015-02-02 MED ORDER — ACETAMINOPHEN 10 MG/ML IV SOLN
1000.0000 mg | Freq: Once | INTRAVENOUS | Status: AC
  Administered 2015-02-02: 1000 mg via INTRAVENOUS
  Filled 2015-02-02: qty 100

## 2015-02-02 MED ORDER — OXYCODONE HCL 5 MG PO TABS: 5.0000 mg | ORAL_TABLET | ORAL | Status: DC | PRN

## 2015-02-02 MED ORDER — HYDROMORPHONE HCL 1 MG/ML IJ SOLN
0.5000 mg | INTRAMUSCULAR | Status: DC | PRN
  Administered 2015-02-02 – 2015-02-04 (×4): 0.5 mg via INTRAVENOUS
  Filled 2015-02-02 (×4): qty 1

## 2015-02-02 MED ORDER — SODIUM CHLORIDE 0.9 % IJ SOLN
INTRAMUSCULAR | Status: AC
  Filled 2015-02-02: qty 40

## 2015-02-02 MED ORDER — PHENYLEPHRINE HCL 10 MG/ML IJ SOLN
INTRAMUSCULAR | Status: DC | PRN
  Administered 2015-02-02 (×2): 50 ug via INTRAVENOUS

## 2015-02-02 MED ORDER — CEFAZOLIN SODIUM-DEXTROSE 2-3 GM-% IV SOLR
2.0000 g | Freq: Once | INTRAVENOUS | Status: AC
  Administered 2015-02-02 (×2): 2 g via INTRAVENOUS
  Filled 2015-02-02: qty 50

## 2015-02-02 MED ORDER — WARFARIN - PHYSICIAN DOSING INPATIENT
Freq: Every day | Status: DC
  Administered 2015-02-03 – 2015-02-04 (×2)

## 2015-02-02 MED ORDER — ROCURONIUM BROMIDE 100 MG/10ML IV SOLN
INTRAVENOUS | Status: DC | PRN
  Administered 2015-02-02: 15 mg via INTRAVENOUS
  Administered 2015-02-02: 8 mg via INTRAVENOUS

## 2015-02-02 MED ORDER — LIDOCAINE HCL (CARDIAC) 20 MG/ML IV SOLN
INTRAVENOUS | Status: DC | PRN
  Administered 2015-02-02: 50 mg via INTRAVENOUS

## 2015-02-02 MED ORDER — ONDANSETRON HCL 4 MG/2ML IJ SOLN: 4.0000 mg | Freq: Four times a day (QID) | INTRAMUSCULAR | Status: DC | PRN

## 2015-02-02 MED ORDER — BUPIVACAINE LIPOSOME 1.3 % IJ SUSP: 20.0000 mL | Freq: Once | INTRAMUSCULAR | Status: DC

## 2015-02-02 MED ORDER — PHENOL 1.4 % MT LIQD: 1.0000 | OROMUCOSAL | Status: DC | PRN

## 2015-02-02 MED ORDER — SODIUM CHLORIDE 0.9 % IJ SOLN
INTRAMUSCULAR | Status: AC
  Filled 2015-02-02: qty 10

## 2015-02-02 MED ORDER — ONDANSETRON HCL 4 MG/2ML IJ SOLN
INTRAMUSCULAR | Status: AC
  Filled 2015-02-02: qty 2

## 2015-02-02 MED ORDER — LACTATED RINGERS IV SOLN
INTRAVENOUS | Status: DC
  Administered 2015-02-02: 11:00:00 via INTRAVENOUS

## 2015-02-02 MED ORDER — METHOCARBAMOL 500 MG PO TABS
500.0000 mg | ORAL_TABLET | Freq: Four times a day (QID) | ORAL | Status: DC | PRN
  Filled 2015-02-02 (×2): qty 1

## 2015-02-02 MED ORDER — POLYETHYLENE GLYCOL 3350 17 G PO PACK
17.0000 g | PACK | Freq: Every day | ORAL | Status: DC
  Administered 2015-02-02 – 2015-02-05 (×4): 17 g via ORAL
  Filled 2015-02-02 (×4): qty 1

## 2015-02-02 MED ORDER — CEFAZOLIN SODIUM 1-5 GM-% IV SOLN
1.0000 g | Freq: Once | INTRAVENOUS | Status: AC
  Administered 2015-02-02 (×2): 1 g via INTRAVENOUS
  Filled 2015-02-02: qty 50

## 2015-02-02 MED ORDER — ONDANSETRON HCL 4 MG PO TABS
4.0000 mg | ORAL_TABLET | Freq: Four times a day (QID) | ORAL | Status: DC | PRN
  Administered 2015-02-02: 4 mg via ORAL
  Filled 2015-02-02: qty 1

## 2015-02-02 MED ORDER — SUCCINYLCHOLINE CHLORIDE 20 MG/ML IJ SOLN
INTRAMUSCULAR | Status: AC
  Filled 2015-02-02: qty 1

## 2015-02-02 MED ORDER — ONDANSETRON HCL 4 MG/2ML IJ SOLN
4.0000 mg | Freq: Four times a day (QID) | INTRAMUSCULAR | Status: DC
  Administered 2015-02-02 – 2015-02-05 (×10): 4 mg via INTRAVENOUS
  Filled 2015-02-02 (×10): qty 2

## 2015-02-02 MED ORDER — PROPOFOL 10 MG/ML IV BOLUS
INTRAVENOUS | Status: DC | PRN
  Administered 2015-02-02: 200 mg via INTRAVENOUS

## 2015-02-02 MED ORDER — MENTHOL 3 MG MT LOZG: 1.0000 | LOZENGE | OROMUCOSAL | Status: DC | PRN

## 2015-02-02 MED ORDER — ROCURONIUM BROMIDE 50 MG/5ML IV SOLN
INTRAVENOUS | Status: AC
  Filled 2015-02-02: qty 1

## 2015-02-02 MED ORDER — DEXAMETHASONE SODIUM PHOSPHATE 4 MG/ML IJ SOLN
4.0000 mg | Freq: Once | INTRAMUSCULAR | Status: AC
  Administered 2015-02-02: 4 mg via INTRAVENOUS

## 2015-02-02 MED ORDER — CEFAZOLIN SODIUM-DEXTROSE 2-3 GM-% IV SOLR
2.0000 g | Freq: Four times a day (QID) | INTRAVENOUS | Status: AC
  Administered 2015-02-02 (×2): 2 g via INTRAVENOUS
  Filled 2015-02-02 (×2): qty 50

## 2015-02-02 MED ORDER — BUPIVACAINE-EPINEPHRINE (PF) 0.5% -1:200000 IJ SOLN
INTRAMUSCULAR | Status: DC | PRN
  Administered 2015-02-02: 30 mL

## 2015-02-02 MED ORDER — SODIUM CHLORIDE 0.9 % IV SOLN
INTRAVENOUS | Status: DC
  Administered 2015-02-02: 16:00:00 via INTRAVENOUS

## 2015-02-02 MED ORDER — DEXAMETHASONE SODIUM PHOSPHATE 4 MG/ML IJ SOLN
10.0000 mg | Freq: Once | INTRAMUSCULAR | Status: AC
  Administered 2015-02-03: 10 mg via INTRAVENOUS
  Filled 2015-02-02: qty 3

## 2015-02-02 MED ORDER — OXYCODONE HCL 5 MG PO TABS
5.0000 mg | ORAL_TABLET | ORAL | Status: DC
  Administered 2015-02-02 – 2015-02-05 (×18): 5 mg via ORAL
  Filled 2015-02-02 (×18): qty 1

## 2015-02-02 MED ORDER — SUCCINYLCHOLINE CHLORIDE 20 MG/ML IJ SOLN
INTRAMUSCULAR | Status: DC | PRN
  Administered 2015-02-02: 120 mg via INTRAVENOUS

## 2015-02-02 MED ORDER — FENTANYL CITRATE (PF) 100 MCG/2ML IJ SOLN
25.0000 ug | INTRAMUSCULAR | Status: DC | PRN
  Administered 2015-02-02 (×2): 25 ug via INTRAVENOUS
  Filled 2015-02-02: qty 2

## 2015-02-02 MED ORDER — METOCLOPRAMIDE HCL 5 MG/ML IJ SOLN: 5.0000 mg | Freq: Three times a day (TID) | INTRAMUSCULAR | Status: DC | PRN

## 2015-02-02 MED ORDER — METOCLOPRAMIDE HCL 10 MG PO TABS: 5.0000 mg | ORAL_TABLET | Freq: Three times a day (TID) | ORAL | Status: DC | PRN

## 2015-02-02 MED ORDER — PHENYLEPHRINE HCL 10 MG/ML IJ SOLN
INTRAMUSCULAR | Status: AC
  Filled 2015-02-02: qty 1

## 2015-02-02 MED ORDER — BUPIVACAINE LIPOSOME 1.3 % IJ SUSP
INTRAMUSCULAR | Status: AC
  Filled 2015-02-02: qty 20

## 2015-02-02 MED ORDER — DIPHENHYDRAMINE HCL 12.5 MG/5ML PO ELIX: 12.5000 mg | ORAL_SOLUTION | ORAL | Status: DC | PRN

## 2015-02-02 MED ORDER — FENTANYL CITRATE (PF) 250 MCG/5ML IJ SOLN
INTRAMUSCULAR | Status: DC | PRN
  Administered 2015-02-02: 25 ug via INTRAVENOUS
  Administered 2015-02-02 (×6): 50 ug via INTRAVENOUS
  Administered 2015-02-02: 25 ug via INTRAVENOUS

## 2015-02-02 MED ORDER — PREGABALIN 50 MG PO CAPS
50.0000 mg | ORAL_CAPSULE | Freq: Three times a day (TID) | ORAL | Status: DC
  Administered 2015-02-02 – 2015-02-05 (×9): 50 mg via ORAL
  Filled 2015-02-02 (×9): qty 1

## 2015-02-02 MED ORDER — BISACODYL 5 MG PO TBEC: 5.0000 mg | DELAYED_RELEASE_TABLET | Freq: Every day | ORAL | Status: DC | PRN

## 2015-02-02 MED ORDER — BUPIVACAINE-EPINEPHRINE (PF) 0.5% -1:200000 IJ SOLN
INTRAMUSCULAR | Status: AC
Start: 2015-02-02 — End: 2015-02-02
  Filled 2015-02-02: qty 30

## 2015-02-02 MED ORDER — ACETAMINOPHEN 650 MG RE SUPP: 650.0000 mg | Freq: Four times a day (QID) | RECTAL | Status: DC | PRN

## 2015-02-02 MED ORDER — CELECOXIB 100 MG PO CAPS
200.0000 mg | ORAL_CAPSULE | Freq: Every day | ORAL | Status: DC
  Administered 2015-02-02 – 2015-02-05 (×4): 200 mg via ORAL
  Filled 2015-02-02 (×4): qty 2

## 2015-02-02 MED ORDER — ALUM & MAG HYDROXIDE-SIMETH 200-200-20 MG/5ML PO SUSP: 30.0000 mL | ORAL | Status: DC | PRN

## 2015-02-02 MED ORDER — METHOCARBAMOL 1000 MG/10ML IJ SOLN
500.0000 mg | Freq: Four times a day (QID) | INTRAVENOUS | Status: DC
  Administered 2015-02-02 – 2015-02-05 (×12): 500 mg via INTRAVENOUS
  Filled 2015-02-02 (×16): qty 5

## 2015-02-02 MED ORDER — BUPIVACAINE LIPOSOME 1.3 % IJ SUSP
INTRAMUSCULAR | Status: DC | PRN
  Administered 2015-02-02: 60 mL

## 2015-02-02 MED ORDER — FOLIC ACID 1 MG PO TABS
1.0000 mg | ORAL_TABLET | Freq: Every day | ORAL | Status: DC
  Administered 2015-02-02 – 2015-02-05 (×4): 1 mg via ORAL
  Filled 2015-02-02 (×4): qty 1

## 2015-02-02 SURGICAL SUPPLY — 66 items
BAG HAMPER (MISCELLANEOUS) ×2 IMPLANT
BANDAGE ESMARK 6X9 LF (GAUZE/BANDAGES/DRESSINGS) ×1 IMPLANT
BIT DRILL 3.2X128 (BIT) IMPLANT
BLADE HEX COATED 2.75 (ELECTRODE) ×2 IMPLANT
BLADE SAG 18X100X1.27 (BLADE) IMPLANT
BLADE SAGITTAL 25.0X1.27X90 (BLADE) IMPLANT
BLADE SAW SAG 90X13X1.27 (BLADE) IMPLANT
BNDG ESMARK 6X9 LF (GAUZE/BANDAGES/DRESSINGS) ×2
BOWL SMART MIX CTS (DISPOSABLE) IMPLANT
CAP KNEE TOTAL 3 SIGMA ×2 IMPLANT
CEMENT HV SMART SET (Cement) ×4 IMPLANT
CLOTH BEACON ORANGE TIMEOUT ST (SAFETY) ×2 IMPLANT
COVER LIGHT HANDLE STERIS (MISCELLANEOUS) ×8 IMPLANT
COVER PROBE W GEL 5X96 (DRAPES) ×2 IMPLANT
CUFF TOURNIQUET SINGLE 34IN LL (TOURNIQUET CUFF) ×2 IMPLANT
CUFF TOURNIQUET SINGLE 44IN (TOURNIQUET CUFF) IMPLANT
DECANTER SPIKE VIAL GLASS SM (MISCELLANEOUS) ×4 IMPLANT
DRAPE BACK TABLE (DRAPES) ×2 IMPLANT
DRAPE EXTREMITY T 121X128X90 (DRAPE) ×2 IMPLANT
DRSG MEPILEX BORDER 4X12 (GAUZE/BANDAGES/DRESSINGS) ×2 IMPLANT
DURAPREP 26ML APPLICATOR (WOUND CARE) ×4 IMPLANT
ELECT REM PT RETURN 9FT ADLT (ELECTROSURGICAL) ×2
ELECTRODE REM PT RTRN 9FT ADLT (ELECTROSURGICAL) ×1 IMPLANT
EVACUATOR 3/16  PVC DRAIN (DRAIN) ×1
EVACUATOR 3/16 PVC DRAIN (DRAIN) ×1 IMPLANT
GLOVE BIO SURGEON STRL SZ 6.5 (GLOVE) ×4 IMPLANT
GLOVE BIO SURGEON STRL SZ7 (GLOVE) ×2 IMPLANT
GLOVE BIOGEL PI IND STRL 7.0 (GLOVE) ×3 IMPLANT
GLOVE BIOGEL PI INDICATOR 7.0 (GLOVE) ×3
GLOVE EXAM NITRILE PF MED BLUE (GLOVE) ×2 IMPLANT
GLOVE OPTIFIT SS 8.0 STRL (GLOVE) ×2 IMPLANT
GLOVE SKINSENSE NS SZ8.0 LF (GLOVE) ×2
GLOVE SKINSENSE STRL SZ8.0 LF (GLOVE) ×2 IMPLANT
GLOVE SS N UNI LF 8.5 STRL (GLOVE) ×2 IMPLANT
GOWN STRL REUS W/TWL LRG LVL3 (GOWN DISPOSABLE) ×8 IMPLANT
GOWN STRL REUS W/TWL XL LVL3 (GOWN DISPOSABLE) ×2 IMPLANT
HANDPIECE INTERPULSE COAX TIP (DISPOSABLE) ×1
HOOD W/PEELAWAY (MISCELLANEOUS) ×8 IMPLANT
INST SET MAJOR BONE (KITS) ×2 IMPLANT
IV NS IRRIG 3000ML ARTHROMATIC (IV SOLUTION) ×2 IMPLANT
KIT BLADEGUARD II DBL (SET/KITS/TRAYS/PACK) ×2 IMPLANT
KIT ROOM TURNOVER APOR (KITS) ×2 IMPLANT
MANIFOLD NEPTUNE II (INSTRUMENTS) ×2 IMPLANT
MARKER SKIN DUAL TIP RULER LAB (MISCELLANEOUS) ×2 IMPLANT
NEEDLE HYPO 21X1.5 SAFETY (NEEDLE) ×2 IMPLANT
NEEDLE HYPO 25X1 1.5 SAFETY (NEEDLE) ×2 IMPLANT
NS IRRIG 1000ML POUR BTL (IV SOLUTION) ×2 IMPLANT
PACK TOTAL JOINT (CUSTOM PROCEDURE TRAY) ×2 IMPLANT
PAD ARMBOARD 7.5X6 YLW CONV (MISCELLANEOUS) ×2 IMPLANT
PAD DANNIFLEX CPM (ORTHOPEDIC SUPPLIES) ×2 IMPLANT
PIN TROCAR 3 INCH (PIN) ×2 IMPLANT
SAW OSC TIP CART 19.5X105X1.3 (SAW) ×2 IMPLANT
SET BASIN LINEN APH (SET/KITS/TRAYS/PACK) ×2 IMPLANT
SET HNDPC FAN SPRY TIP SCT (DISPOSABLE) ×1 IMPLANT
STAPLER VISISTAT 35W (STAPLE) ×2 IMPLANT
SUT BRALON NAB BRD #1 30IN (SUTURE) ×4 IMPLANT
SUT MON AB 0 CT1 (SUTURE) ×4 IMPLANT
SUT MON AB 2-0 CT1 36 (SUTURE) IMPLANT
SYR 20CC LL (SYRINGE) IMPLANT
SYR 30ML LL (SYRINGE) ×2 IMPLANT
SYR BULB IRRIGATION 50ML (SYRINGE) ×2 IMPLANT
TOWEL OR 17X26 4PK STRL BLUE (TOWEL DISPOSABLE) ×2 IMPLANT
TOWER CARTRIDGE SMART MIX (DISPOSABLE) ×2 IMPLANT
TRAY FOLEY CATH SILVER 16FR (SET/KITS/TRAYS/PACK) ×2 IMPLANT
WATER STERILE IRR 1000ML POUR (IV SOLUTION) ×4 IMPLANT
YANKAUER SUCT 12FT TUBE ARGYLE (SUCTIONS) ×2 IMPLANT

## 2015-02-02 NOTE — Telephone Encounter (Addendum)
-----   Message from Mena Goes, RN sent at 01/30/2015 11:45 AM EDT ----- Regarding: Schedule   ----- Message -----    From: Serafina Mitchell, MD    Sent: 01/30/2015  10:31 AM      To: [facility] Charge Pool  01/30/2015:  Surgeon:  [name] Procedure Performed:  1.  Ultrasound-guided access, right femoral vein  2.  IVC venogram  3.  Catheter in inferior vena cava  4.  Placement of inferior vena cava filter (retrievable)Rescheduled the patient to follow-up with me in one month with a bilateral lower extremity venous ultrasound to rule out DVT    l/m with patient's daughter notifying her of post op appt. on 02-26-15 at 3:00pm

## 2015-02-02 NOTE — Anesthesia Procedure Notes (Signed)
Procedure Name: Intubation Date/Time: 02/02/2015 11:47 AM Performed by: Tressie Stalker E Pre-anesthesia Checklist: Patient identified, Patient being monitored, Timeout performed, Emergency Drugs available and Suction available Patient Re-evaluated:Patient Re-evaluated prior to inductionOxygen Delivery Method: Circle System Utilized Preoxygenation: Pre-oxygenation with 100% oxygen Intubation Type: IV induction Ventilation: Mask ventilation without difficulty Laryngoscope Size: Mac and 4 Grade View: Grade I Tube type: Oral Tube size: 7.0 mm Number of attempts: 1 Airway Equipment and Method: Stylet Placement Confirmation: ETT inserted through vocal cords under direct vision,  positive ETCO2 and breath sounds checked- equal and bilateral Secured at: 21 cm Tube secured with: Tape Dental Injury: Teeth and Oropharynx as per pre-operative assessment

## 2015-02-02 NOTE — Anesthesia Preprocedure Evaluation (Addendum)
Anesthesia Evaluation  Patient identified by MRN, date of birth, ID band Patient awake    Reviewed: Allergy & Precautions, NPO status , Patient's Chart, lab work & pertinent test results  Airway Mallampati: II  TM Distance: >3 FB     Dental  (+) Partial Upper, Dental Advisory Given, Teeth Intact   Pulmonary former smoker, PE breath sounds clear to auscultation        Cardiovascular - anginaDVT (Pulm embolus after back surgery. IVC filter now.) + dysrhythmias (hx irreg HR, neg workup by cardiologist) Rhythm:Regular Rate:Normal     Neuro/Psych  Headaches,    GI/Hepatic negative GI ROS,   Endo/Other  Hyperthyroidism (euthyroid now)   Renal/GU      Musculoskeletal  (+) Arthritis -, Rheumatoid disorders,    Abdominal   Peds  Hematology   Anesthesia Other Findings   Reproductive/Obstetrics                            Anesthesia Physical Anesthesia Plan  ASA: III  Anesthesia Plan: General   Post-op Pain Management:    Induction: Intravenous  Airway Management Planned: Oral ETT  Additional Equipment:   Intra-op Plan:   Post-operative Plan: Extubation in OR  Informed Consent: I have reviewed the patients History and Physical, chart, labs and discussed the procedure including the risks, benefits and alternatives for the proposed anesthesia with the patient or authorized representative who has indicated his/her understanding and acceptance.     Plan Discussed with:   Anesthesia Plan Comments:         Anesthesia Quick Evaluation

## 2015-02-02 NOTE — Interval H&P Note (Signed)
History and Physical Interval Note:  02/02/2015 11:31 AM  Willie Howe  has presented today for surgery, with the diagnosis of osteoarthritis right knee  The various methods of treatment have been discussed with the patient and family. After consideration of risks, benefits and other options for treatment, the patient has consented to  Procedure(s) with comments: TOTAL KNEE ARTHROPLASTY (Right) - LM with pt's daughter of new arrival time (10:45)   as a surgical intervention .  The patient's history has been reviewed, patient examined, groin wound is normal; no change in status, stable for surgery.  I have reviewed the patient's chart and labs.  Questions were answered to the patient's satisfaction.     Arther Abbott

## 2015-02-02 NOTE — Brief Op Note (Signed)
02/02/2015  2:05 PM  PATIENT:  Willie Howe  54 y.o. male  PRE-OPERATIVE DIAGNOSIS:  osteoarthritis right knee  POST-OPERATIVE DIAGNOSIS:  osteoarthritis right knee  Operative findings severe varus osteoarthritis external rotation deformity of the tibia severe osteophytes large pannus formation severe deformity  Implants size 4 femur size 5 tibia size 10 polyethylene posterior stabilized insert size 41 patella   Surgical dictation for right total knee Surgeon Dr. Aline Brochure [phone]  Assisted by [facility] AND KATHERINE   Anesthetic GENERAL  Drains: one Hemovac drain in the joint   Exparel   Marcaine with epinephrine     details of procedure:   The patient was identified in the preop holding area and the surgical site was confirmed as the RIGHT knee. Chart review and update were completed. The patient was taken to the operating room for spinal anesthesia. After successful spinal anesthesia Foley catheter was inserted. The patient was placed supine on the operating table.  THE RIGHT leg was prepped with DURAPrep and draped sterilely. Timeout was completed. The limb was then exsanguinated a  6 inch Esmarch. The tourniquet was elevated to 300 mmHg.   A midline incision was made and taken down to the extensor mechanism followed by medial arthrotomy. The patella was everted. Side effect to me was performed as needed. The osteophytes were resected.  Anterior cruciate ligament and PCL and medial and lateral meniscus were resected.   a 3/8 inch drill bit was used to enter the femoral canal which was suctioned and irrigated until the fluid was clear. The distal femoral cut was set for 10 millimeter resection with a 5   RIGHT Valgus angle. This cut was completed and checked for flatness.   the femur was then measured to a size 4.5.  The cutting block was placed to match the epicondyles and the 4 distal cuts were made.   the tibia was subluxated forward and the external alignment guide was  placed. We removed 4 mm of bone from the LOWER MEDIAL  side. We set the guide for neutral varus valgus cut related to the  Mechanical axis of the tibia and for slope matching the patient's anatomy. Rotational alignment was set using the tibial tubercle, tibial spine and second metatarsal. The cutting block was pinned and the proximal tibia was resected.    spacer blocks were placed starting with a 10 mm insert to confirm equal flexion-extension gaps. A size  10 mm insert balanced the gaps AFTER COMPLETE MEDIAL RELEASE INCLUDING superficial MCL deep MCL posterior medial corner posteromedial capsule and re-cutting the distal femur an additional 2 mm  We placed the femoral notch cutting guide size 4  and resected the notch.   Trial implants replaced using appropriate size femur , appropriate size tibial baseplate which was measured after the proximal tibia resection. Tibial rotation was set patella tracking was normal   The tibia was then punched per manufacture technique making sure to avoid internal rotation.   The patella measured a size 25   We resected down to a size 14 using a size 41 button.   Final range of motion check was performed with the appropriate size trials as mentioned above. Satisfactory reduction and motion were obtained.   Trial implants were removed. The bone was irrigated and dried and the cement was mixed on the back table  These implants were then cemented in place. Excess cement was removed. The cement was allowed to cure. Second irrigation was performed.    FInal range  of motion check and stability check was completed  The wound was irrigated third time Hemovac drain was placed, extensor mechanism was closed with #1 Nurolon followed by 0 Monocryl and staples to reapproximate the skin edges and subcutaneous tissue.   Sterile dressing was applied  The patient was taken recovery in stable condition        PROCEDURE:  Procedure(s) with comments:  RIGHT TOTAL KNEE  ARTHROPLASTY (Right) -  SURGEON:  Surgeon(s) and Role:    * Carole Civil, MD - Primary  PHYSICIAN ASSISTANT:     EBL:  Total I/O In: 500 [I.V.:500] Out: 105 [Urine:80; Blood:25]  BLOOD ADMINISTERED:none    LOCAL MEDICATIONS USED:  MARCAINE    and OTHER EXPAREL 20/40 DILUTION  SPECIMEN:  No Specimen  DISPOSITION OF SPECIMEN:  N/A  COUNTS:  YES  TOURNIQUET:   Total Tourniquet Time Documented: Thigh (Right) - 102 minutes Total: Thigh (Right) - 102 minutes   DICTATION: .Viviann Spare Dictation  PLAN OF CARE: Admit to inpatient   PATIENT DISPOSITION:  PACU - hemodynamically stable.   Delay start of Pharmacological VTE agent (>24hrs) due to surgical blood loss or risk of bleeding: yes

## 2015-02-02 NOTE — Transfer of Care (Signed)
Immediate Anesthesia Transfer of Care Note  Patient: Willie Howe  Procedure(s) Performed: Procedure(s) with comments:  RIGHT TOTAL KNEE ARTHROPLASTY (Right) - LM with pt's daughter of new arrival time (10:45)    Patient Location: PACU  Anesthesia Type:General  Level of Consciousness: awake  Airway & Oxygen Therapy: Patient Spontanous Breathing and Patient connected to face mask oxygen  Post-op Assessment: Report given to RN  Post vital signs: Reviewed and stable  Last Vitals:  Filed Vitals:   02/02/15 1115  BP: 132/85  Pulse:   Temp:   Resp: 15    Complications: No apparent anesthesia complications

## 2015-02-02 NOTE — Op Note (Signed)
02/02/2015  2:05 PM  PATIENT:  Willie Howe  54 y.o. male  PRE-OPERATIVE DIAGNOSIS:  osteoarthritis right knee  POST-OPERATIVE DIAGNOSIS:  osteoarthritis right knee  Operative findings severe varus osteoarthritis external rotation deformity of the tibia severe osteophytes large pannus formation severe deformity  Implants size 4 femur size 5 tibia size 10 polyethylene posterior stabilized insert size 41 patella   Surgical dictation for right total knee Surgeon Dr. Aline Brochure [phone]  Assisted by [facility] AND [facility]   Anesthetic GENERAL  Drains: one Hemovac drain in the joint   Exparel   Marcaine with epinephrine     details of procedure:   The patient was identified in the preop holding area and the surgical site was confirmed as the RIGHT knee. Chart review and update were completed. The patient was taken to the operating room for spinal anesthesia. After successful spinal anesthesia Foley catheter was inserted. The patient was placed supine on the operating table.  THE RIGHT leg was prepped with DURAPrep and draped sterilely. Timeout was completed. The limb was then exsanguinated a  6 inch Esmarch. The tourniquet was elevated to 300 mmHg.   A midline incision was made and taken down to the extensor mechanism followed by medial arthrotomy. The patella was everted. Side effect to me was performed as needed. The osteophytes were resected.  Anterior cruciate ligament and PCL and medial and lateral meniscus were resected.   a 3/8 inch drill bit was used to enter the femoral canal which was suctioned and irrigated until the fluid was clear. The distal femoral cut was set for 10 millimeter resection with a 5   RIGHT Valgus angle. This cut was completed and checked for flatness.   the femur was then measured to a size 4.5.  The cutting block was placed to match the epicondyles and the 4 distal cuts were made.   the tibia was subluxated forward and the external alignment guide was  placed. We removed 4 mm of bone from the LOWER MEDIAL  side. We set the guide for neutral varus valgus cut related to the  Mechanical axis of the tibia and for slope matching the patient's anatomy. Rotational alignment was set using the tibial tubercle, tibial spine and second metatarsal. The cutting block was pinned and the proximal tibia was resected.    spacer blocks were placed starting with a 10 mm insert to confirm equal flexion-extension gaps. A size  10 mm insert balanced the gaps AFTER COMPLETE MEDIAL RELEASE INCLUDING superficial MCL deep MCL posterior medial corner posteromedial capsule and re-cutting the distal femur an additional 2 mm  We placed the femoral notch cutting guide size 4  and resected the notch.   Trial implants replaced using appropriate size femur , appropriate size tibial baseplate which was measured after the proximal tibia resection. Tibial rotation was set patella tracking was normal   The tibia was then punched per manufacture technique making sure to avoid internal rotation.   The patella measured a size 25   We resected down to a size 14 using a size 41 button.   Final range of motion check was performed with the appropriate size trials as mentioned above. Satisfactory reduction and motion were obtained.   Trial implants were removed. The bone was irrigated and dried and the cement was mixed on the back table  These implants were then cemented in place. Excess cement was removed. The cement was allowed to cure. Second irrigation was performed.    FInal range  of motion check and stability check was completed  The wound was irrigated third time Hemovac drain was placed, extensor mechanism was closed with #1 Nurolon followed by 0 Monocryl and staples to reapproximate the skin edges and subcutaneous tissue.   Sterile dressing was applied  The patient was taken recovery in stable condition        PROCEDURE:  Procedure(s) with comments:  RIGHT TOTAL KNEE  ARTHROPLASTY (Right) -  SURGEON:  Surgeon(s) and Role:    * Carole Civil, MD - Primary  PHYSICIAN ASSISTANT:     EBL:  Total I/O In: 500 [I.V.:500] Out: 105 [Urine:80; Blood:25]  BLOOD ADMINISTERED:none    LOCAL MEDICATIONS USED:  MARCAINE    and OTHER EXPAREL 20/40 DILUTION  SPECIMEN:  No Specimen  DISPOSITION OF SPECIMEN:  N/A  COUNTS:  YES  TOURNIQUET:   Total Tourniquet Time Documented: Thigh (Right) - 102 minutes Total: Thigh (Right) - 102 minutes   DICTATION: .Viviann Spare Dictation  PLAN OF CARE: Admit to inpatient   PATIENT DISPOSITION:  PACU - hemodynamically stable.   Delay start of Pharmacological VTE agent (>24hrs) due to surgical blood loss or risk of bleeding: yes

## 2015-02-02 NOTE — Anesthesia Postprocedure Evaluation (Signed)
  Anesthesia Post-op Note  Patient: Willie Howe  Procedure(s) Performed: Procedure(s) with comments:  RIGHT TOTAL KNEE ARTHROPLASTY (Right) - LM with pt's daughter of new arrival time (10:45)    Patient Location: PACU  Anesthesia Type:General  Level of Consciousness: awake  Airway and Oxygen Therapy: Patient Spontanous Breathing and Patient connected to nasal cannula oxygen  Post-op Pain: moderate  Post-op Assessment: Post-op Vital signs reviewed, Patient's Cardiovascular Status Stable, Respiratory Function Stable, Patent Airway and No signs of Nausea or vomiting              Post-op Vital Signs: Reviewed and stable  Last Vitals:  Filed Vitals:   02/02/15 1445  BP: 138/72  Pulse: 85  Temp:   Resp: 16    Complications: No apparent anesthesia complications

## 2015-02-03 LAB — PROTIME-INR
INR: 1.08 (ref 0.00–1.49)
Prothrombin Time: 14.2 seconds (ref 11.6–15.2)

## 2015-02-03 LAB — CBC
HCT: 34.6 % — ABNORMAL LOW (ref 39.0–52.0)
Hemoglobin: 11.1 g/dL — ABNORMAL LOW (ref 13.0–17.0)
MCH: 28.9 pg (ref 26.0–34.0)
MCHC: 32.1 g/dL (ref 30.0–36.0)
MCV: 90.1 fL (ref 78.0–100.0)
PLATELETS: 238 10*3/uL (ref 150–400)
RBC: 3.84 MIL/uL — AB (ref 4.22–5.81)
RDW: 15.7 % — AB (ref 11.5–15.5)
WBC: 17 10*3/uL — ABNORMAL HIGH (ref 4.0–10.5)

## 2015-02-03 LAB — BASIC METABOLIC PANEL
ANION GAP: 8 (ref 5–15)
BUN: 11 mg/dL (ref 6–20)
CALCIUM: 8.2 mg/dL — AB (ref 8.9–10.3)
CHLORIDE: 102 mmol/L (ref 101–111)
CO2: 27 mmol/L (ref 22–32)
Creatinine, Ser: 0.9 mg/dL (ref 0.61–1.24)
GFR calc Af Amer: >60 mL/min (ref >60)
GFR calc non Af Amer: >60 mL/min (ref >60)
Glucose, Bld: 169 mg/dL — ABNORMAL HIGH (ref 65–99)
Potassium: 3.8 mmol/L (ref 3.5–5.1)
SODIUM: 137 mmol/L (ref 135–145)

## 2015-02-03 MED ORDER — WARFARIN SODIUM 5 MG PO TABS
10.0000 mg | ORAL_TABLET | Freq: Once | ORAL | Status: AC
  Administered 2015-02-03: 10 mg via ORAL
  Filled 2015-02-03: qty 2

## 2015-02-03 NOTE — Progress Notes (Signed)
Foley cath removed per MD order post op day one.  Patient tolerated well.

## 2015-02-03 NOTE — Progress Notes (Signed)
Postoperative note  Postoperative day number  1  Status post left tka  Vital signs  BP 115/74 mmHg  Pulse 93  Temp(Src) 98.7 F (37.1 C) (Oral)  Resp 18  Ht 6\' 3"  (1.905 m)  Wt 285 lb (129.275 kg)  BMI 35.62 kg/m2  SpO2 99%   Pertinent labs   BMP Latest Ref Rng 02/03/2015 01/30/2015 01/26/2015  Glucose 65 - 99 mg/dL 169(H) 139(H) 81  BUN 6 - 20 mg/dL 11 8 14   Creatinine 0.61 - 1.24 mg/dL 0.90 0.80 0.95  Sodium 135 - 145 mmol/L 137 140 140  Potassium 3.5 - 5.1 mmol/L 3.8 3.6 3.8  Chloride 101 - 111 mmol/L 102 106 107  CO2 22 - 32 mmol/L 27 - 25  Calcium 8.9 - 10.3 mg/dL 8.2(L) - 9.2   CBC Latest Ref Rng 02/03/2015 01/30/2015 01/26/2015  WBC 4.0 - 10.5 K/uL 17.0(H) - 7.7  Hemoglobin 13.0 - 17.0 g/dL 11.1(L) 14.6 14.7  Hematocrit 39.0 - 52.0 % 34.6(L) 43.0 44.5  Platelets 150 - 400 K/uL 238 - 308   inr 1.08  Patient complaints  C/O pain   Physical exam  Mild drainage mild swelling / neurovascualr intact   Assessment and plan   Stable doing well  Continue pain management  Start PT

## 2015-02-03 NOTE — Progress Notes (Signed)
Patient dangled on the side of the bed.  Some blood coming from the bottom of the dressing, applied a clean and dry dressing.  Patient tolerated dangling without issue.  Will continue to monitor.

## 2015-02-03 NOTE — Evaluation (Signed)
Physical Therapy Evaluation Patient Details Name: Willie Howe MRN: [id number] DOB: [birth year] Today's Date: 02/03/2015   History of Present Illness  has a history of rheumatoid arthritis, status post second lumbar surgery with a back fusion complicated by pulmonary embolus, residual right lower extremity radicular pain in the L5 distribution associated with back pain who presents for evaluation of long-standing pain in his right knee.  He opted to have a TKR on his Rt knee which was performed on 02/02/2015.  Clinical Impression  Pt did extremely well with therapy.  Pt needs contact-guard assist only for mobility and is currently actively able to flex his knee to 60 degrees.     Follow Up Recommendations Home health PT    Equipment Recommendations   Rolling walker    Recommendations for Other Services   none    Precautions / Restrictions Precautions Precautions: None Restrictions Weight Bearing Restrictions: Yes RLE Weight Bearing: Weight bearing as tolerated      Mobility  Bed Mobility Overal bed mobility: Modified Independent                Transfers Overall transfer level: Modified independent Equipment used: Rolling walker (2 wheeled)                Ambulation/Gait Ambulation/Gait assistance: Modified independent (Device/Increase time) Ambulation Distance (Feet): 80 Feet Assistive device: Rolling walker (2 wheeled) Gait Pattern/deviations: Step-to pattern   Gait velocity interpretation: Below normal speed for age/gender              Pertinent Vitals/Pain Pain Assessment: 0-10 Pain Score: 4  Pain Descriptors / Indicators: Aching Pain Intervention(s): Repositioned    Home Living Family/patient expects to be discharged to:: Private residence Living Arrangements: Spouse/significant other Available Help at Discharge: Family Type of Home: House       Home Layout: One level Home Equipment: Kasandra Knudsen - single point      Prior Function   I                  Extremity/Trunk Assessment               Lower Extremity Assessment: Overall WFL for tasks assessed         Communication    I  Cognition Arousal/Alertness: Awake/alert   Overall Cognitive Status: Within Functional Limits for tasks assessed                         Exercises Total Joint Exercises Ankle Circles/Pumps: 10 reps Quad Sets: 10 reps Gluteal Sets: 10 reps Heel Slides: 10 reps Straight Leg Raises: 10 reps Goniometric ROM: 8-60      Assessment/Plan    PT Assessment Patient needs continued PT services  PT Diagnosis Acute pain;Difficulty walking   PT Problem List Decreased range of motion;Pain;Decreased strength  PT Treatment Interventions Gait training;Functional mobility training;Therapeutic activities;Therapeutic exercise   PT Goals (Current goals can be found in the Care Plan section) Acute Rehab PT Goals Patient Stated Goal: To be able to walk without pain long term.  Potential to Achieve Goals: Good    Frequency 7X/week   Barriers to discharge    none       End of Session Equipment Utilized During Treatment: Gait belt   Patient left: in bed;in CPM           Time: 1010-1108 PT Time Calculation (min) (ACUTE ONLY): 58 min   Charges:   PT Evaluation $Initial PT Evaluation Tier I:  1 Procedure PT Treatments $Gait Training: 8-22 mins $Therapeutic Activity: 8-22 mins   PT G CodesRayetta Humphrey, PT CLT [phone] 02/03/2015, 12:27 PM

## 2015-02-04 LAB — CBC
HCT: 31.9 % — ABNORMAL LOW (ref 39.0–52.0)
Hemoglobin: 10.3 g/dL — ABNORMAL LOW (ref 13.0–17.0)
MCH: 28.9 pg (ref 26.0–34.0)
MCHC: 32.3 g/dL (ref 30.0–36.0)
MCV: 89.6 fL (ref 78.0–100.0)
PLATELETS: 240 10*3/uL (ref 150–400)
RBC: 3.56 MIL/uL — ABNORMAL LOW (ref 4.22–5.81)
RDW: 15.5 % (ref 11.5–15.5)
WBC: 11.7 10*3/uL — ABNORMAL HIGH (ref 4.0–10.5)

## 2015-02-04 LAB — PROTIME-INR
INR: 1.28 (ref 0.00–1.49)
Prothrombin Time: 16.2 seconds — ABNORMAL HIGH (ref 11.6–15.2)

## 2015-02-04 NOTE — Progress Notes (Signed)
Physical Therapy Treatment Patient Details Name: Willie Howe MRN: [id number] DOB: Jan 12, 1961 Today's Date: 02/04/2015    History of Present Illness has a history of rheumatoid arthritis, status post second lumbar surgery with a back fusion complicated by pulmonary embolus, residual right lower extremity radicular pain in the L5 distribution associated with back pain who presents for evaluation of long-standing pain in his right knee.  He opted to have a TKR on his Rt knee which was performed on 02/02/2015.    PT Comments    Pt continues to progress well.  Pt able to complete bed mobility with decreased effort and time.  Pt not as fatigued after ambulation today.   Follow Up Recommendations  Home health PT     Equipment Recommendations  Rolling walker with 5" wheels    Recommendations for Other Services   none    Precautions / Restrictions Precautions Precautions: None Restrictions Weight Bearing Restrictions: No    Mobility  Mod I with bed mobility and gt.  PT ambulated 27' with RW     Cognition Arousal/Alertness: Awake/alert   Overall Cognitive Status: Within Functional Limits for tasks assessed                      Exercises Total Joint Exercises Ankle Circles/Pumps: Both;10 reps Quad Sets: Both;10 reps Gluteal Sets: Both;10 reps Heel Slides: Both;10 reps Straight Leg Raises: Both;10 reps Goniometric ROM: 5-62        Pertinent Vitals/Pain Pain Score: 5        Prior Function   I         PT Goals (current goals can now be found in the care plan section) Progress towards PT goals: Progressing toward goals    Frequency   continue daily treatment.  Anticipate D/C tomorrow     PT Plan Current plan remains appropriate       End of Session Equipment Utilized During Treatment: Gait belt Activity Tolerance: Patient tolerated treatment well Patient left: in chair;with call bell/phone within reach     Time: 1000-1025 PT Time Calculation (min)  (ACUTE ONLY): 25 min  Charges:  $Gait Training: 8-22 mins $Therapeutic Exercise: 8-22 mins                    G CodesRayetta Humphrey, PT CLT [phone] 02/04/2015, 10:33 AM

## 2015-02-04 NOTE — Progress Notes (Signed)
Postoperative note  Postoperative day number  2   Status post RT TKA  Vital signs  BP 121/59 mmHg  Pulse 90  Temp(Src) 98.2 F (36.8 C) (Oral)  Resp 20  Ht 6\' 3"  (1.905 m)  Wt 285 lb (129.275 kg)  BMI 35.62 kg/m2  SpO2 99%   Pertinent labs  8-60 ROM /  CBC Latest Ref Rng 02/04/2015 02/03/2015 01/30/2015  WBC 4.0 - 10.5 K/uL 11.7(H) 17.0(H) -  Hemoglobin 13.0 - 17.0 g/dL 10.3(L) 11.1(L) 14.6  Hematocrit 39.0 - 52.0 % 31.9(L) 34.6(L) 43.0  Platelets 150 - 400 K/uL 240 238 -     Patient complaints  5/10 pain   Physical exam  icision clean   Assessment and plan   Drain removed  PT DISCHARGE MON

## 2015-02-04 NOTE — Anesthesia Postprocedure Evaluation (Signed)
  Anesthesia Post-op Note  Patient: Willie Howe  Procedure(s) Performed: Procedure(s) with comments:  RIGHT TOTAL KNEE ARTHROPLASTY (Right) - LM with pt's daughter of new arrival time (10:45)    Patient Location: Nursing Unit  Anesthesia Type:General  Level of Consciousness: awake, alert  and oriented  Airway and Oxygen Therapy: Patient Spontanous Breathing  Post-op Pain: mild  Post-op Assessment: Post-op Vital signs reviewed, Patient's Cardiovascular Status Stable, Respiratory Function Stable, Patent Airway, No signs of Nausea or vomiting, Adequate PO intake and Pain level controlled              Post-op Vital Signs: Reviewed and stable  Last Vitals:  Filed Vitals:   02/04/15 1300  BP: 152/80  Pulse: 97  Temp: 36.8 C  Resp: 20    Complications: No apparent anesthesia complications

## 2015-02-04 NOTE — Addendum Note (Signed)
Addendum  created 02/04/15 1444 by Ollen Bowl, CRNA   Modules edited: Notes Section   Notes Section:  File: [id number]

## 2015-02-05 ENCOUNTER — Encounter (HOSPITAL_COMMUNITY): Payer: Self-pay | Admitting: Orthopedic Surgery

## 2015-02-05 LAB — TYPE AND SCREEN
ABO/RH(D): A POS
ANTIBODY SCREEN: POSITIVE
DAT, IGG: NEGATIVE
DONOR AG TYPE: NEGATIVE
Donor AG Type: NEGATIVE
Donor AG Type: NEGATIVE
Donor AG Type: NEGATIVE
UNIT DIVISION: 0
UNIT DIVISION: 0
Unit division: 0
Unit division: 0

## 2015-02-05 LAB — CBC
HEMATOCRIT: 31.2 % — AB (ref 39.0–52.0)
Hemoglobin: 10.1 g/dL — ABNORMAL LOW (ref 13.0–17.0)
MCH: 29.5 pg (ref 26.0–34.0)
MCHC: 32.4 g/dL (ref 30.0–36.0)
MCV: 91.2 fL (ref 78.0–100.0)
PLATELETS: 240 10*3/uL (ref 150–400)
RBC: 3.42 MIL/uL — AB (ref 4.22–5.81)
RDW: 15.1 % (ref 11.5–15.5)
WBC: 9.6 10*3/uL (ref 4.0–10.5)

## 2015-02-05 LAB — PROTIME-INR
INR: 1.54 — ABNORMAL HIGH (ref 0.00–1.49)
Prothrombin Time: 18.6 seconds — ABNORMAL HIGH (ref 11.6–15.2)

## 2015-02-05 MED ORDER — HYDROCODONE-ACETAMINOPHEN 7.5-325 MG PO TABS: 1.0000 | ORAL_TABLET | ORAL | Status: DC

## 2015-02-05 MED ORDER — METHOCARBAMOL 1000 MG/10ML IJ SOLN: 500.0000 mg | Freq: Four times a day (QID) | INTRAVENOUS | Status: DC

## 2015-02-05 MED ORDER — METHOCARBAMOL 500 MG PO TABS: 500.0000 mg | ORAL_TABLET | Freq: Four times a day (QID) | ORAL | Status: DC | PRN

## 2015-02-05 MED ORDER — SENNA 8.6 MG PO TABS: 1.0000 | ORAL_TABLET | Freq: Two times a day (BID) | ORAL | Status: DC

## 2015-02-05 MED ORDER — WARFARIN SODIUM 5 MG PO TABS: 5.0000 mg | ORAL_TABLET | Freq: Every day | ORAL | Status: DC

## 2015-02-05 NOTE — Progress Notes (Signed)
Physical Therapy Treatment Patient Details Name: Willie Howe MRN: [id number] DOB: [birth year] Today's Date: 02/05/2015    History of Present Illness has a history of rheumatoid arthritis, status post second lumbar surgery with a back fusion complicated by pulmonary embolus, residual right lower extremity radicular pain in the L5 distribution associated with back pain who presents for evaluation of long-standing pain in his right knee.  He opted to have a TKR on his Rt knee which was performed on 02/02/2015.    PT Comments    Pt is progressing well.  Right knee AA ROM=6-70 degrees.  He was instructed in precautions and given a written instructions as well as a written home exercise program.  He was instructed in gait on level and on steps.  He was able to ascend/descend 3 steps using 2 handrails with no difficulty.  He was instructed in the application of ice to the knee for control of pain and edema.  He plans to d/c to home today.  Follow Up Recommendations  Home health PT     Equipment Recommendations  Rolling walker with 5" wheels    Recommendations for Other Services   none    Precautions / Restrictions Precautions Precautions: None;Knee Precaution Booklet Issued: No Precaution Comments: booklet was mailed to pt...today he was provided with a written home exercise program and written precautions Restrictions Weight Bearing Restrictions: Yes RLE Weight Bearing: Weight bearing as tolerated    Mobility  Bed Mobility Overal bed mobility: Modified Independent                Transfers Overall transfer level: Modified independent                  Ambulation/Gait Ambulation/Gait assistance: Modified independent (Device/Increase time) Ambulation Distance (Feet): 80 Feet Assistive device: Rolling walker (2 wheeled) Gait Pattern/deviations: Antalgic   Gait velocity interpretation: Below normal speed for age/gender     Stairs Stairs: Yes Stairs assistance:  Modified independent (Device/Increase time) Stair Management: Two rails;Step to pattern;Backwards;Forwards Number of Stairs: 3    Wheelchair Mobility    Modified Rankin (Stroke Patients Only)       Balance Overall balance assessment: No apparent balance deficits (not formally assessed)                                  Cognition Arousal/Alertness: Awake/alert Behavior During Therapy: WFL for tasks assessed/performed Overall Cognitive Status: Within Functional Limits for tasks assessed                      Exercises Total Joint Exercises Ankle Circles/Pumps: Both;10 reps Quad Sets: Both;10 reps Short Arc Quad: AAROM;Right;10 reps;Supine Heel Slides: AAROM;Right;10 reps;Supine Goniometric ROM: 6-70    General Comments        Pertinent Vitals/Pain Pain Assessment: 0-10 Pain Score: 8  Pain Location: right knee Pain Descriptors / Indicators: Aching Pain Intervention(s): Limited activity within patient's tolerance;Patient requesting pain meds-RN notified;Ice applied    Home Living                      Prior Function            PT Goals (current goals can now be found in the care plan section) Progress towards PT goals: Goals met/education completed, patient discharged from PT    Frequency       PT Plan      Co-evaluation  End of Session Equipment Utilized During Treatment: Gait belt Activity Tolerance: Patient tolerated treatment well Patient left: in chair;with call bell/phone within reach     Time: 0939-1012 PT Time Calculation (min) (ACUTE ONLY): 33 min  Charges:  $Gait Training: 8-22 mins $Therapeutic Exercise: 8-22 mins                    G CodesSable Feil  PT 02/05/2015, 10:26 AM [phone]

## 2015-02-05 NOTE — Progress Notes (Signed)
Pt IV removed, tolerated well.  Reviewed discharge instructions and prescriptions, answered all questions at this time.  Will continue to monitor until pt leaves floor.

## 2015-02-05 NOTE — Clinical Social Work Note (Signed)
CSW received consult for possible SNF. PT has worked with pt and plan is for return home with home health. CSW will sign off, but can be reconsulted if needed.  Benay Pike, [place]

## 2015-02-05 NOTE — Progress Notes (Signed)
OT Screen Note  Patient Details Name: Willie Howe MRN: [id number] DOB: [birth year]   Cancelled Treatment:    Reason Eval/Treat Not Completed: OT screened, no needs identified, will sign off. Pt functioning at Mod I level with no concerns for discharge. Thank you for the referral.  Ailene Ravel, OTR/L,CBIS  [phone]  02/05/2015, 8:40 AM

## 2015-02-05 NOTE — Care Management Note (Signed)
Case Management Note  Patient Details  Name: Willie Howe MRN: [id number] Date of Birth: [birth year]  Expected Discharge Date:                  Expected Discharge Plan:  [place]  In-House Referral:  NA  Discharge planning Services  CM Consult  Post Acute Care Choice:  Home Health Choice offered to:  Patient  DME Arranged:    DME Agency:     HH Arranged:  RN, PT [place] Agency:  [place]  Status of Service:  Completed, signed off  Medicare Important Message Given:    Date Medicare IM Given:    Medicare IM give by:    Date Additional Medicare IM Given:    Additional Medicare Important Message give by:     If discussed at [place] of Stay Meetings, dates discussed:    Additional Comments: Pt is from home, lives with wife. Pt has walker and 3 in 1 at home. Pt plans to discharge home today with [facility] RN & PT. Pt has chosen [facility] for [facility] needs. Romualdo Bolk, of [facility] aware of referral and will obtain pt info from chart. No further CM needs.  Sherald Barge, RN 02/05/2015, 8:41 AM

## 2015-02-05 NOTE — Care Management Note (Signed)
Case Management Note  Patient Details  Name: NEPHTALI DOCKEN MRN: [id number] Date of Birth: 1961/03/17   Expected Discharge Date:                  Expected Discharge Plan:  [place]  In-House Referral:  NA  Discharge planning Services  CM Consult  Post Acute Care Choice:  Home Health, Durable Medical Equipment Choice offered to:  Patient  DME Arranged:  Continuous passive motion machine DME Agency:  [place]:  RN, PT [place] Agency:  [place]  Status of Service:  Completed, signed off  Medicare Important Message Given:    Date Medicare IM Given:    Medicare IM give by:    Date Additional Medicare IM Given:    Additional Medicare Important Message give by:     If discussed at [place] of Stay Meetings, dates discussed:    Additional Comments: Pt ordered CPM Machine. [facility] made aware of order and will obtain pt info from chart. CPM machine will be delivered to pt's home this evening. No further CM needs.  Sherald Barge, RN 02/05/2015, 2:26 PM

## 2015-02-05 NOTE — Discharge Summary (Signed)
Physician Discharge Summary  Patient ID: Willie Howe MRN: [id number] DOB/AGE: Jun 20, 1961 54 y.o.  Admit date: 02/02/2015 Discharge date: 02/05/2015  Admission Diagnoses: Osteoarthritis right knee  Discharge Diagnoses: Osteoarthritis right knee Active Problems:   Primary osteoarthritis of right knee   Arthritis of knee, right   Discharged Condition: good  Hospital Course: The patient underwent under complicated right total knee arthroplasty on 02/01/2015. He tolerated the procedure well. Postoperatively he progressed well with physical therapy including use of the CPM. His range of motion active range of motion is 6-70 degrees and exam plating 80 feet.  Other than mild expected swelling of his knee he has no significant wound drainage. His knee is clean dry and intact. His calf is supple  He has a retractable filter in place to protect him from deep vein thrombosis and embolus. He is on warfarin therapy and I will continue for at least 30 days for the knee replacement and then we will check with his primary care physicians regarding further warfarin therapy  BP 114/54 mmHg  Pulse 101  Temp(Src) 99.7 F (37.6 C) (Oral)  Resp 18  Ht 6\' 3"  (1.905 m)  Wt 285 lb (129.275 kg)  BMI 35.62 kg/m2  SpO2 99%       Disposition: 01-Home or Self Care  Discharge Instructions    Face-to-face encounter (required for Medicare/Medicaid patients)    Complete by:  As directed   I Arther Abbott certify that this patient is under my care and that I, or a nurse practitioner or physician's assistant working with me, had a face-to-face encounter that meets the physician face-to-face encounter requirements with this patient on 02/05/2015. The encounter with the patient was in whole, or in part for the following medical condition(s) which is the primary reason for home health care (List medical condition): right tka  The encounter with the patient was in whole, or in part, for the following medical  condition, which is the primary reason for home health care:  right tka  I certify that, based on my findings, the following services are medically necessary home health services:   Nursing Physical therapy    Reason for Medically Necessary Home Health Services:  Therapy- Personnel officer, Public librarian  My clinical findings support the need for the above services:  Unable to leave home safely without assistance and/or assistive device  Further, I certify that my clinical findings support that this patient is homebound due to:  Unable to leave home safely without assistance     For home use only DME Continuous passive motion machine    Complete by:  As directed   0-70  6 hrs per day x 3 weeks Increase 10 per day     Home Health    Complete by:  As directed   To provide the following care/treatments:   RN PT    Draw inr every Monday and send orders to dr Aline Brochure            Medication List    STOP taking these medications        aspirin 81 MG tablet     HYDROcodone-acetaminophen 5-325 MG per tablet  Commonly known as:  NORCO/VICODIN  Replaced by:  HYDROcodone-acetaminophen 7.5-325 MG per tablet      TAKE these medications        cyclobenzaprine 7.5 MG tablet  Commonly known as:  FEXMID  Take 1 tablet (7.5 mg total) by mouth 3 (three) times daily as  needed for muscle spasms.     folic acid 1 MG tablet  Commonly known as:  FOLVITE  Take 1 mg by mouth daily.     HYDROcodone-acetaminophen 7.5-325 MG per tablet  Commonly known as:  NORCO  Take 1 tablet by mouth every 4 (four) hours.     methocarbamol 500 MG tablet  Commonly known as:  ROBAXIN  Take 1 tablet (500 mg total) by mouth every 6 (six) hours as needed for muscle spasms.     methotrexate 2.5 MG tablet  Commonly known as:  RHEUMATREX  Take 20 mg by mouth once a week. 8 tablets on Friday. Caution:Chemotherapy. Protect from light.     predniSONE 5 MG tablet  Commonly known as:  DELTASONE   Take 5 mg by mouth daily.     senna 8.6 MG Tabs tablet  Commonly known as:  SENOKOT  Take 1 tablet (8.6 mg total) by mouth 2 (two) times daily.     warfarin 5 MG tablet  Commonly known as:  COUMADIN  Take 1 tablet (5 mg total) by mouth daily.           Follow-up Information    Follow up with Arther Abbott, MD.   Specialties:  Orthopedic Surgery, Radiology   Contact information:   [address] [place] [address] [phone]       Follow-up June 27, check wound for possible staple removal. Signed: Arther Abbott 02/05/2015, 10:31 AM

## 2015-02-09 ENCOUNTER — Telehealth: Payer: Self-pay | Admitting: Orthopedic Surgery

## 2015-02-09 NOTE — Telephone Encounter (Signed)
Routing to Dr Harrison 

## 2015-02-09 NOTE — Telephone Encounter (Signed)
Patient had RT KNEE surgery 02/02/15 and still bruised, in a lot of pain, a lot of swelling, warm to touch. Compression hose has a run in it, they are not sure if that maybe plays a roll in things. They are icing and elevating without much success. Patient has an appointment Monday at 10:45, his wife can be reached at  [phone] with any suggestions until then.

## 2015-02-12 ENCOUNTER — Ambulatory Visit (INDEPENDENT_AMBULATORY_CARE_PROVIDER_SITE_OTHER): Payer: 59 | Admitting: Orthopedic Surgery

## 2015-02-12 ENCOUNTER — Telehealth: Payer: Self-pay | Admitting: Orthopedic Surgery

## 2015-02-12 VITALS — BP 116/78 | Ht 75.0 in | Wt 285.0 lb

## 2015-02-12 DIAGNOSIS — Z96651 Presence of right artificial knee joint: Secondary | ICD-10-CM

## 2015-02-12 MED ORDER — HYDROCODONE-ACETAMINOPHEN 10-325 MG PO TABS: 1.0000 | ORAL_TABLET | ORAL | Status: DC | PRN

## 2015-02-12 NOTE — Telephone Encounter (Signed)
Patient is calling stating he would like a return call, the Dr. Georgina Quint his coumadin level and it was 3.7 and his Dr. Is changing up his medication and he has questions and asked to speak to the nurse or Dr. Aline Brochure.

## 2015-02-12 NOTE — Telephone Encounter (Signed)
Has been taking coumadin 5mg  daily

## 2015-02-12 NOTE — Progress Notes (Signed)
Patient ID: Willie Howe, male   DOB: [birth year], 54 y.o.   MRN: [id number] Chief Complaint  Patient presents with  . Follow-up    post op 1, RT TKA, DOS 01/31/15    [name] is now 10 days after his knee replacement he is going to come back in 2 days to get staples out. His range of motion is only 50 and the CPM he was at 70 and the hospital. He says he had a lot of swelling and the swelling is finally gone down so weeks improvement in his motion over the next couple of weeks. His pain medicine is not holding him so we'll increase that. He has a filter in place to prevent any thrombosis and embolus and is also on warfarin therapy and he wants to get his laboratory studies done as an outpatient to save his co-pay and I'm in agreement that he will call me with an INR this week.  Recommend physical therapy weightbearing as tolerated continue warfarin therapy follow-up with me in 2-4 weeks for evaluation of his range of motion

## 2015-02-13 ENCOUNTER — Telehealth: Payer: Self-pay | Admitting: *Deleted

## 2015-02-13 MED ORDER — WARFARIN SODIUM 2.5 MG PO TABS: 2.5000 mg | ORAL_TABLET | Freq: Every day | ORAL | Status: DC

## 2015-02-13 NOTE — Telephone Encounter (Signed)
PER DR HARRISON HOLD COUMADIN TODAY (TUES) RESUME 2.5MG  DAILY ON WED.   PATIENT AWARE

## 2015-02-13 NOTE — Addendum Note (Signed)
Addended by: Carole Civil on: 02/13/2015 08:15 AM   Modules accepted: Orders

## 2015-02-15 ENCOUNTER — Ambulatory Visit (INDEPENDENT_AMBULATORY_CARE_PROVIDER_SITE_OTHER): Payer: 59 | Admitting: Orthopedic Surgery

## 2015-02-15 ENCOUNTER — Telehealth: Payer: Self-pay | Admitting: Orthopedic Surgery

## 2015-02-15 ENCOUNTER — Encounter: Payer: Self-pay | Admitting: Orthopedic Surgery

## 2015-02-15 VITALS — BP 130/52 | Ht 75.0 in | Wt 285.0 lb

## 2015-02-15 DIAGNOSIS — Z9660 Presence of unspecified orthopedic joint implant: Secondary | ICD-10-CM

## 2015-02-15 DIAGNOSIS — Z471 Aftercare following joint replacement surgery: Secondary | ICD-10-CM

## 2015-02-15 DIAGNOSIS — Z96698 Presence of other orthopedic joint implants: Secondary | ICD-10-CM

## 2015-02-15 DIAGNOSIS — Z96651 Presence of right artificial knee joint: Secondary | ICD-10-CM

## 2015-02-15 NOTE — Patient Instructions (Signed)
Call [place] therapy dept at [facility] to arrange therapy  Call INR to Dr Aline Brochure

## 2015-02-15 NOTE — Telephone Encounter (Signed)
Routing to Dr Harrison 

## 2015-02-15 NOTE — Progress Notes (Signed)
Patient ID: Willie Howe, male   DOB: 1961-07-29, 54 y.o.   MRN: [id number]  Follow up visit  Chief Complaint  Patient presents with  . Follow-up    post op Right TKA, staple removal, DOS 01/31/15    BP 130/52 mmHg  Ht 6\' 3"  (1.905 m)  Wt 285 lb (129.275 kg)  BMI 35.62 kg/m2  Encounter Diagnoses  Name Primary?  . Status post total right knee replacement Yes  . Aftercare following other joint replacement surgery     The patient is now at 70 on his machine. His wounds are looking good he does still has some knee swelling his ankles looks good.  He did complain of shortness of breath called EMS today he thinks he just got overheated and then a little faint sounds like a vagal episode  We'll see him in 2 weeks we will on his knee flexion to be at 12  Start outpatient therapy and [facility]

## 2015-02-15 NOTE — Telephone Encounter (Signed)
Patients wife Willie Howe called and stated that Willie Howe may be running late today they have had to call EMS to his house for reasoning unknown, She is worried that staples need to come out today and I explained to her that Dr. Aline Brochure will be gone for lunch between the hours of 12 to 2 and I was needing a definite arrival time and she stated they would be here asap.

## 2015-02-20 ENCOUNTER — Ambulatory Visit: Payer: 59 | Admitting: Orthopedic Surgery

## 2015-02-22 ENCOUNTER — Encounter: Payer: Self-pay | Admitting: Surgery

## 2015-02-23 ENCOUNTER — Other Ambulatory Visit: Payer: Self-pay | Admitting: Orthopedic Surgery

## 2015-02-23 ENCOUNTER — Ambulatory Visit: Payer: 59 | Attending: Orthopedic Surgery | Admitting: Physical Therapy

## 2015-02-23 DIAGNOSIS — M25561 Pain in right knee: Secondary | ICD-10-CM

## 2015-02-23 DIAGNOSIS — M25661 Stiffness of right knee, not elsewhere classified: Secondary | ICD-10-CM

## 2015-02-23 NOTE — Therapy (Signed)
[facility] [place], [place], [address] Phone: [phone]   Fax:  [phone]  Physical Therapy Treatment  Patient Details  Name: Willie Howe MRN: [id number] Date of Birth: 04-27-1961 Referring Provider:  Carole Civil, MD  Encounter Date: 02/23/2015      PT End of Session - 02/23/15 1025    Visit Number 1   Number of Visits 18   Date for PT Re-Evaluation 04/20/15   PT Start Time 0950   PT Stop Time 1040   PT Time Calculation (min) 50 min   Activity Tolerance Patient tolerated treatment well   Behavior During Therapy [facility] for tasks assessed/performed      Past Medical History  Diagnosis Date  . Thyroid disease     Graves Disease  . Previous back surgery     x 3  . Dysrhythmia   . Anginal pain   . DVT (deep venous thrombosis)   . History of pulmonary embolism   . Graves disease 1996  . Kidney stone   . Arthritis     Past Surgical History  Procedure Laterality Date  . Knee surgery      x 2  . Nephrolithotomy    . Back surgery    . Colonoscopy    . Lumbar wound debridement N/A 02/23/2014    Procedure: LUMBAR WOUND DEBRIDEMENT;  Surgeon: Faythe Ghee, MD;  Location: [facility];  Service: Neurosurgery;  Laterality: N/A;  exploration lumbar wound. repair of dural defect.  Marland Kitchen Spine surgery  2015    Dr Hal Neer Fusion   . Spine surgery  1999    discectomy  . Knee surgery Left 1976    ?ligament repair   . Menisectomy Right     open medial   . Knee arthroscopy Right 2006    meniscus   . Peripheral vascular catheterization N/A 01/30/2015    Procedure: IVC Filter Insertion;  Surgeon: Serafina Mitchell, MD;  Location: [facility];  Service: Cardiovascular;  Laterality: N/A;  . Ivc filter    . Total knee arthroplasty Right 02/02/2015    Procedure:  RIGHT TOTAL KNEE ARTHROPLASTY;  Surgeon: Carole Civil, MD;  Location: [facility];  Service: Orthopedics;  Laterality: Right;  LM with pt's daughter of new  arrival time (10:45)      There were no vitals filed for this visit.  Visit Diagnosis:  Right knee pain - Plan: PT plan of care cert/re-cert  Knee stiffness, right - Plan: PT plan of care cert/re-cert      Subjective Assessment - 02/23/15 1014    Subjective Need to get this swelling down in my knee.   Patient Stated Goals Reduce my right knee pain and move better.   Pain Score 6    Pain Location Knee   Pain Orientation Right   Pain Descriptors / Indicators Aching;Constant;Sore   Pain Type Surgical pain   Pain Frequency Constant            [facility] PT Assessment - 02/23/15 0001    Assessment   Medical Diagnosis Right total knee replacement.   Onset Date/Surgical Date --  02/02/15.   Next MD Visit --  03/01/15.   Precautions   Precaution Comments --  No ultrasound.   Restrictions   Weight Bearing Restrictions No   Balance Screen   Has the patient fallen in the past 6 months Yes   How many times? 1   Has the patient had a  decrease in activity level because of a fear of falling?  Yes   Is the patient reluctant to leave their home because of a fear of falling?  No   Home Ecologist residence   Prior Function   Level of Independence Independent   Cognition   Overall Cognitive Status Within Functional Limits for tasks assessed   Observation/Other Assessments   Skin Integrity --  Steri-strips intact over right knee incisonal site.   Observation/Other Assessments-Edema    Edema Circumferential   Circumferential Edema   Circumferential - Right 5 cms > on right than left.   ROM / Strength   AROM / PROM / Strength AROM;PROM;Strength   AROM   Overall AROM Comments -8 degrees to 60 degrees.   PROM   Overall PROM Comments -2 degrees to 63 degrees.   Strength   Overall Strength Comments Right hip and knee= 3-/5.  Decreased volitional contraction of right quadriceps muscle group.   Palpation   Palpation comment --  Especially tender to  palpation over right medial joint line.   Ambulation/Gait   Gait Comments The patient is ambulating safely with a FWW.                     [facility] Adult PT Treatment/Exercise - 02/23/15 0001    Modalities   Modalities Cryotherapy;Electrical Stimulation   Cryotherapy   Number Minutes Cryotherapy 20 Minutes   Cryotherapy Location Knee   Type of Cryotherapy --  Medium vasopneumatic.   Acupuncturist Location Right knee.   Electrical Stimulation Action --  1-10 HZ at 100% scan x 20 minutes.   Electrical Stimulation Goals Edema;Pain                  PT Short Term Goals - 02/23/15 1034    PT SHORT TERM GOAL #1   Title Ind with advanced HEP.   Time 6   Period Weeks   Status New   PT SHORT TERM GOAL #2   Title Full active right knee extension in order to normalize gait.   Time 6   Period Weeks   Status New   PT SHORT TERM GOAL #3   Title Active knee flexion to 115 degrees+ so the patient can perform functional tasks and do so with pain not > 2-3/10.   Time 6   Period Weeks   Status New   PT SHORT TERM GOAL #4   Title Increase right  knee strength to a solid 4+/5 to provide good stability for accomplishment of functional activities   Time 6   Period Weeks   Status New   PT SHORT TERM GOAL #5   Title Perform a reciprocating stair gait with one railing with pain not > 3/10.   Time 6   Period Weeks   Status New   Additional Short Term Goals   Additional Short Term Goals Yes   PT SHORT TERM GOAL #6   Title Perform ADL's with pain not > 3/10.   Time 6   Period Weeks   Status New                  Plan - 02/23/15 1025    Clinical Impression Statement The patient underwent a right total knee replacment on 02/02/15.  He had some home health physical therapy.  His CC is that of pain (rated at 5-6/10) and swelling.  He is still using a FWW  at this time.  His pain increases to 7+/10 with flexion.   Pt will benefit  from skilled therapeutic intervention in order to improve on the following deficits Abnormal gait;Decreased activity tolerance;Decreased range of motion;Decreased strength;Pain;Increased edema   Rehab Potential Good   PT Frequency 3x / week   PT Duration 6 weeks   PT Treatment/Interventions ADLs/Self Care Home Management;Cryotherapy;Occupational psychologist;Therapeutic activities;Therapeutic exercise;Neuromuscular re-education;Patient/family education;Manual techniques;Passive range of motion;Vasopneumatic Device   PT Next Visit Plan Nustep and progress into total knee protocol.   Consulted and Agree with Plan of Care Patient          G-Codes - March 09, 2015 1039    Functional Assessment Tool Used FOTO.   Functional Limitation Mobility: Walking and moving around   Mobility: Walking and Moving Around Current Status [phone]) At least 60 percent but less than 80 percent impaired, limited or restricted   Mobility: Walking and Moving Around Goal Status [phone]) At least 1 percent but less than 20 percent impaired, limited or restricted      Problem List Patient Active Problem List   Diagnosis Date Noted  . Arthritis of knee, right 02/02/2015  . Primary osteoarthritis of right knee   . Pulmonary embolus 01/09/2015  . UTI (lower urinary tract infection) 02/21/2014  . Other headache syndrome 02/21/2014  . Nausea with vomiting 02/21/2014  . Rheumatoid arthritis 02/21/2014  . Nausea & vomiting 02/21/2014  . Headache(784.0) 02/21/2014  . Lumbar spinal stenosis 02/10/2014    Jayden Kratochvil, [place] MPT 2015/03/09, 10:42 AM  [facility] [address] [place], [place], [address] Phone: [phone]   Fax:  [phone]

## 2015-02-26 ENCOUNTER — Ambulatory Visit: Payer: 59 | Admitting: Physical Therapy

## 2015-02-26 ENCOUNTER — Ambulatory Visit (INDEPENDENT_AMBULATORY_CARE_PROVIDER_SITE_OTHER): Payer: 59 | Admitting: Surgery

## 2015-02-26 ENCOUNTER — Encounter: Payer: Self-pay | Admitting: Physical Therapy

## 2015-02-26 ENCOUNTER — Ambulatory Visit (HOSPITAL_COMMUNITY)
Admission: RE | Admit: 2015-02-26 | Discharge: 2015-02-26 | Disposition: A | Discharge status: Home / Self Care | Payer: 59 | Source: Ambulatory Visit | Attending: Surgery | Admitting: Surgery

## 2015-02-26 ENCOUNTER — Encounter: Payer: Self-pay | Admitting: Surgery

## 2015-02-26 VITALS — BP 129/73 | HR 99 | Ht 75.0 in | Wt 281.8 lb

## 2015-02-26 DIAGNOSIS — M25561 Pain in right knee: Secondary | ICD-10-CM | POA: Diagnosis not present

## 2015-02-26 DIAGNOSIS — Z9889 Other specified postprocedural states: Secondary | ICD-10-CM | POA: Diagnosis not present

## 2015-02-26 DIAGNOSIS — Z95828 Presence of other vascular implants and grafts: Secondary | ICD-10-CM

## 2015-02-26 DIAGNOSIS — I2699 Other pulmonary embolism without acute cor pulmonale: Secondary | ICD-10-CM | POA: Diagnosis not present

## 2015-02-26 DIAGNOSIS — M25661 Stiffness of right knee, not elsewhere classified: Secondary | ICD-10-CM

## 2015-02-26 NOTE — Progress Notes (Signed)
Patient name: Willie Howe MRN: [id number] DOB: 1961-02-19 Sex: male     Chief Complaint  Patient presents with  . Re-evaluation    R/O DVT    HISTORY OF PRESENT ILLNESS: The patient is back today for follow-up.  He is status post placement of an IVC filter on 01/30/2015.  This was a Actor.  This was done because of his history of DVT/PE, and his upcoming orthopedic procedure (knee replacement on the right).  He tolerated his surgery without difficulty.  He is now in the process of undergoing rehabilitation for his knee.  He is taking Coumadin which was started in the perioperative period  Past Medical History  Diagnosis Date  . Thyroid disease     Graves Disease  . Previous back surgery     x 3  . Dysrhythmia   . Anginal pain   . DVT (deep venous thrombosis)   . History of pulmonary embolism   . Graves disease 1996  . Kidney stone   . Arthritis     Past Surgical History  Procedure Laterality Date  . Knee surgery      x 2  . Nephrolithotomy    . Back surgery    . Colonoscopy    . Lumbar wound debridement N/A 02/23/2014    Procedure: LUMBAR WOUND DEBRIDEMENT;  Surgeon: Faythe Ghee, MD;  Location: [facility];  Service: Neurosurgery;  Laterality: N/A;  exploration lumbar wound. repair of dural defect.  Marland Kitchen Spine surgery  2015    Dr Hal Neer Fusion   . Spine surgery  1999    discectomy  . Knee surgery Left 1976    ?ligament repair   . Menisectomy Right     open medial   . Knee arthroscopy Right 2006    meniscus   . Peripheral vascular catheterization N/A 01/30/2015    Procedure: IVC Filter Insertion;  Surgeon: Serafina Mitchell, MD;  Location: [facility];  Service: Cardiovascular;  Laterality: N/A;  . Ivc filter    . Total knee arthroplasty Right 02/02/2015    Procedure:  RIGHT TOTAL KNEE ARTHROPLASTY;  Surgeon: Carole Civil, MD;  Location: [facility];  Service: Orthopedics;  Laterality: Right;  LM with pt's daughter of new arrival time (10:45)       History   Social History  . Marital Status: Married    Spouse Name: N/A  . Number of Children: N/A  . Years of Education: N/A   Occupational History  . Not on file.   Social History Main Topics  . Smoking status: Former Smoker -- 1.50 packs/day for 20 years    Types: Cigarettes    Quit date: 02/03/1995  . Smokeless tobacco: Never Used  . Alcohol Use: No  . Drug Use: No  . Sexual Activity: Yes   Other Topics Concern  . Not on file   Social History Narrative    Family History  Problem Relation Age of Onset  . CAD Sister   . Deep vein thrombosis Mother   . Hypertension Mother   . Hypertension Father     Allergies as of 02/26/2015  . (No Known Allergies)    Current Outpatient Prescriptions on File Prior to Visit  Medication Sig Dispense Refill  . folic acid (FOLVITE) 1 MG tablet Take 1 mg by mouth daily.    Marland Kitchen HYDROcodone-acetaminophen (NORCO) 10-325 MG per tablet Take 1 tablet by mouth every 4 (four) hours as needed. 84 tablet  0  . methocarbamol (ROBAXIN) 500 MG tablet Take 1 tablet (500 mg total) by mouth every 6 (six) hours as needed for muscle spasms. 60 tablet 2  . methotrexate (RHEUMATREX) 2.5 MG tablet Take 20 mg by mouth once a week. 8 tablets on Friday. Caution:Chemotherapy. Protect from light.    . predniSONE (DELTASONE) 5 MG tablet Take 5 mg by mouth daily.     Marland Kitchen senna (SENOKOT) 8.6 MG TABS tablet Take 1 tablet (8.6 mg total) by mouth 2 (two) times daily. 120 each 0  . warfarin (COUMADIN) 2.5 MG tablet Take 1 tablet (2.5 mg total) by mouth daily. 14 tablet 0  . cyclobenzaprine (FEXMID) 7.5 MG tablet Take 1 tablet (7.5 mg total) by mouth 3 (three) times daily as needed for muscle spasms. (Patient not taking: Reported on 01/09/2015) 30 tablet 0   No current facility-administered medications on file prior to visit.     REVIEW OF SYSTEMS: Cardiovascular: No chest pain, chest pressure, palpitations, orthopnea, or dyspnea on exertion. No claudication or  rest pain,  history of DVT Pulmonary: No productive cough, asthma or wheezing. Neurologic: No weakness, paresthesias, aphasia, or amaurosis. No dizziness. Hematologic: No bleeding problems or clotting disorders. Musculoskeletal: Recent right knee replacement. Gastrointestinal: No blood in stool or hematemesis Genitourinary: No dysuria or hematuria. Psychiatric:: No history of major depression. Integumentary: No rashes or ulcers. Constitutional: No fever or chills.  PHYSICAL EXAMINATION:   Vital signs are  Filed Vitals:   02/26/15 1609  Height: 6\' 3"  (1.905 m)  Weight: 281 lb 12.8 oz (127.824 kg)   Body mass index is 35.22 kg/(m^2). General: The patient appears their stated age. HEENT:  No gross abnormalities Pulmonary:  Non labored breathing Musculoskeletal: There are no major deformities. Neurologic: No focal weakness or paresthesias are detected, Skin: There are no ulcer or rashes noted. Psychiatric: The patient has normal affect. Cardiovascular:  Right leg is edematous following his knee replacement surgery   Diagnostic Studies Duplex ultrasound was performed today.  There is no evidence of acute DVT.  Assessment: Status post placement of an IVC filter, prior to orthopedic surgery Plan: The patient's Doppler study was negative for DVT today.  We discussed removing the filter through a right internal jugular vein approach.  The patient is currently on Coumadin.  He is to see his orthopedist this Thursday and the stop date for Coumadin will be determined.  We discussed setting him up for IVC filter removal on July 26/27 over August 16/17.  The patient will contact me after he sees his orthopedic surgeon.  Eldridge Abrahams, M.D. [facility] Office: [phone] Pager:  [phone]

## 2015-02-26 NOTE — Therapy (Signed)
[facility] [place], [place], [address] Phone: [phone]   Fax:  [phone]  Physical Therapy Treatment  Patient Details  Name: Willie Howe MRN: [id number] Date of Birth: 08-10-1961 Referring Provider:  Stephens Shire, MD  Encounter Date: 02/26/2015      PT End of Session - 02/26/15 1034    Visit Number 2   Number of Visits 18   Date for PT Re-Evaluation 04/20/15   PT Start Time 1030   PT Stop Time 1125   PT Time Calculation (min) 55 min   Equipment Utilized During Treatment Other (comment)  FWW   Activity Tolerance Patient tolerated treatment well   Behavior During Therapy [facility] for tasks assessed/performed      Past Medical History  Diagnosis Date  . Thyroid disease     Graves Disease  . Previous back surgery     x 3  . Dysrhythmia   . Anginal pain   . DVT (deep venous thrombosis)   . History of pulmonary embolism   . Graves disease 1996  . Kidney stone   . Arthritis     Past Surgical History  Procedure Laterality Date  . Knee surgery      x 2  . Nephrolithotomy    . Back surgery    . Colonoscopy    . Lumbar wound debridement N/A 02/23/2014    Procedure: LUMBAR WOUND DEBRIDEMENT;  Surgeon: Faythe Ghee, MD;  Location: [place];  Service: Neurosurgery;  Laterality: N/A;  exploration lumbar wound. repair of dural defect.  Marland Kitchen Spine surgery  2015    Dr Hal Neer Fusion   . Spine surgery  1999    discectomy  . Knee surgery Left 1976    ?ligament repair   . Menisectomy Right     open medial   . Knee arthroscopy Right 2006    meniscus   . Peripheral vascular catheterization N/A 01/30/2015    Procedure: IVC Filter Insertion;  Surgeon: Serafina Mitchell, MD;  Location: [place] [facility];  Service: Cardiovascular;  Laterality: N/A;  . Ivc filter    . Total knee arthroplasty Right 02/02/2015    Procedure:  RIGHT TOTAL KNEE ARTHROPLASTY;  Surgeon: Carole Civil, MD;  Location: [facility];  Service:  Orthopedics;  Laterality: Right;  LM with pt's daughter of new arrival time (10:45)      There were no vitals filed for this visit.  Visit Diagnosis:  Right knee pain  Knee stiffness, right      Subjective Assessment - 02/26/15 1033    Subjective States that R knee feels tight today but he will try his best today. Reports that he has exercises at home from home health. Reports that steristrips came off over the weekend.   Patient Stated Goals Reduce my right knee pain and move better.   Currently in Pain? Yes   Pain Score 6    Pain Location Knee   Pain Orientation Right   Pain Descriptors / Indicators Tightness;Sore   Pain Type Surgical pain            [facility] PT Assessment - 02/26/15 0001    Assessment   Medical Diagnosis Right total knee replacement.   Onset Date/Surgical Date 02/02/15   Next MD Visit 03/01/2015   ROM / Strength   AROM / PROM / Strength PROM   PROM   Overall PROM  Deficits   PROM Assessment Site Knee   Right/Left Knee Right  Right Knee   Right Knee Extension 2   Right Knee Flexion 77                     [facility] Adult PT Treatment/Exercise - 02/26/15 0001    Exercises   Exercises Knee/Hip   Knee/Hip Exercises: Stretches   Active Hamstring Stretch Right;3 reps;30 seconds   Knee: Self-Stretch to increase Flexion --   Knee/Hip Exercises: Aerobic   Nustep L3 x15 min   Knee/Hip Exercises: Standing   Knee Flexion AROM;Right;2 sets;10 reps;Other (comment)  on 8" step   Rocker Board 3 minutes   Modalities   Modalities Electrical Stimulation;Vasopneumatic   Electrical Stimulation   Electrical Stimulation Location Right knee.   Electrical Stimulation Action IFC   Electrical Stimulation Parameters 1-10 Hz x15 min   Electrical Stimulation Goals Edema;Pain   Vasopneumatic   Number Minutes Vasopneumatic  15 minutes   Vasopnuematic Location  Knee   Vasopneumatic Pressure Medium   Manual Therapy   Manual Therapy Passive ROM;Soft tissue  mobilization   Soft tissue mobilization R patellar mobilizations sup/inf, L/R, tilts   Passive ROM R knee into flex/ext with gentle holds at end range                  PT Short Term Goals - 02/23/15 1034    PT SHORT TERM GOAL #1   Title Ind with advanced HEP.   Time 6   Period Weeks   Status New   PT SHORT TERM GOAL #2   Title Full active right knee extension in order to normalize gait.   Time 6   Period Weeks   Status New   PT SHORT TERM GOAL #3   Title Active knee flexion to 115 degrees+ so the patient can perform functional tasks and do so with pain not > 2-3/10.   Time 6   Period Weeks   Status New   PT SHORT TERM GOAL #4   Title Increase right  knee strength to a solid 4+/5 to provide good stability for accomplishment of functional activities   Time 6   Period Weeks   Status New   PT SHORT TERM GOAL #5   Title Perform a reciprocating stair gait with one railing with pain not > 3/10.   Time 6   Period Weeks   Status New   Additional Short Term Goals   Additional Short Term Goals Yes   PT SHORT TERM GOAL #6   Title Perform ADL's with pain not > 3/10.   Time 6   Period Weeks   Status New                  Plan - 02/26/15 1114    Clinical Impression Statement Patient tolerated treatment well with no verbal complaints of pain verbalized to PTA. All goals remain on-going secondary to decreased R knee ROM, strength, inability to ambulate stairs. Very firm end feels noted during PROM of R knee in clinic today. PROM of R knee measurements were 2-77 deg today in supine. Normal modalties respone noted following removal of the modalties. Reports that the pain is no worse than when he began treatment (6/10).   Pt will benefit from skilled therapeutic intervention in order to improve on the following deficits Abnormal gait;Decreased activity tolerance;Decreased range of motion;Decreased strength;Pain;Increased edema   Rehab Potential Good   PT Frequency 3x /  week   PT Duration 6 weeks   PT Treatment/Interventions ADLs/Self Care Home  Management;Cryotherapy;Occupational psychologist;Therapeutic activities;Therapeutic exercise;Neuromuscular re-education;Patient/family education;Manual techniques;Passive range of motion;Vasopneumatic Device   PT Next Visit Plan Continue per MPT POC and TKR protocol.   Consulted and Agree with Plan of Care Patient        Problem List Patient Active Problem List   Diagnosis Date Noted  . Arthritis of knee, right 02/02/2015  . Primary osteoarthritis of right knee   . Pulmonary embolus 01/09/2015  . UTI (lower urinary tract infection) 02/21/2014  . Other headache syndrome 02/21/2014  . Nausea with vomiting 02/21/2014  . Rheumatoid arthritis 02/21/2014  . Nausea & vomiting 02/21/2014  . Headache(784.0) 02/21/2014  . Lumbar spinal stenosis 02/10/2014    Wynelle Fanny, PTA 02/26/2015, 11:35 AM  [facility] [address] [place], [place], [address] Phone: [phone]   Fax:  [phone]

## 2015-02-28 ENCOUNTER — Ambulatory Visit: Payer: 59 | Admitting: Physical Therapy

## 2015-02-28 ENCOUNTER — Encounter: Payer: Self-pay | Admitting: Physical Therapy

## 2015-02-28 DIAGNOSIS — M25661 Stiffness of right knee, not elsewhere classified: Secondary | ICD-10-CM

## 2015-02-28 DIAGNOSIS — M25561 Pain in right knee: Secondary | ICD-10-CM | POA: Diagnosis not present

## 2015-02-28 NOTE — Therapy (Signed)
[facility] [place], [place], [address] Phone: [phone]   Fax:  [phone]  Physical Therapy Treatment  Patient Details  Name: Willie Howe MRN: [id number] Date of Birth: July 08, 1961 Referring Provider:  Stephens Shire, MD  Encounter Date: 02/28/2015      PT End of Session - 02/28/15 1038    Visit Number 3   Number of Visits 18   Date for PT Re-Evaluation 04/20/15   PT Start Time 1031   PT Stop Time 1124   PT Time Calculation (min) 53 min   Equipment Utilized During Treatment Other (comment)  FWW   Activity Tolerance Patient tolerated treatment well   Behavior During Therapy [facility] for tasks assessed/performed      Past Medical History  Diagnosis Date  . Thyroid disease     Graves Disease  . Previous back surgery     x 3  . Dysrhythmia   . Anginal pain   . DVT (deep venous thrombosis)   . History of pulmonary embolism   . Graves disease 1996  . Kidney stone   . Arthritis     Past Surgical History  Procedure Laterality Date  . Knee surgery      x 2  . Nephrolithotomy    . Back surgery    . Colonoscopy    . Lumbar wound debridement N/A 02/23/2014    Procedure: LUMBAR WOUND DEBRIDEMENT;  Surgeon: Faythe Ghee, MD;  Location: [place];  Service: Neurosurgery;  Laterality: N/A;  exploration lumbar wound. repair of dural defect.  Mar[facility] Kitchen Spine surgery  2015    Dr Hal Neer Fusion   . Spine surgery  1999    discectomy  . Knee surgery Left 1976    ?ligament repair   . Menisectomy Right     open medial   . Knee arthroscopy Right 2006    meniscus   . Peripheral vascular catheterization N/A 01/30/2015    Procedure: IVC Filter Insertion;  Surgeon: Serafina Mitchell, MD;  Location: [place] [facility];  Service: Cardiovascular;  Laterality: N/A;  . Ivc filter    . Total knee arthroplasty Right 02/02/2015    Procedure:  RIGHT TOTAL KNEE ARTHROPLASTY;  Surgeon: Carole Civil, MD;  Location: [facility];  Service:  Orthopedics;  Laterality: Right;  LM with pt's daughter of new arrival time (10:45)      There were no vitals filed for this visit.  Visit Diagnosis:  Right knee pain  Knee stiffness, right      Subjective Assessment - 02/28/15 1037    Subjective Reports that he continues to have swelling. Statest that the medial R knee gives more issues. Reprots trying to be more active to avoid stiffness. Has found a comfortable positionfor sleeping in bed.   Patient Stated Goals Reduce my right knee pain and move better.   Currently in Pain? Yes   Pain Score 5    Pain Location Knee   Pain Orientation Right   Pain Descriptors / Indicators Sore   Pain Type Surgical pain   Pain Frequency Constant            [facility] PT Assessment - 02/28/15 0001    Assessment   Medical Diagnosis Right total knee replacement.   Onset Date/Surgical Date 02/02/15   Next MD Visit 03/01/2015   ROM / Strength   AROM / PROM / Strength PROM   PROM   Overall PROM  Deficits   PROM Assessment Site  Knee   Right/Left Knee Right   Right Knee   Right Knee Extension 1   Right Knee Flexion 79   Palpation   Patella mobility Demonstrates good R patellar mobility in all directions                     [facility] Adult PT Treatment/Exercise - 02/28/15 0001    Knee/Hip Exercises: Stretches   Active Hamstring Stretch Right;3 reps;30 seconds   Knee/Hip Exercises: Aerobic   Nustep L5 x17 min   Knee/Hip Exercises: Standing   Knee Flexion AROM;Right;2 sets;10 reps;Other (comment)  3 sec hold on 8"step   Rocker Board 3 minutes   Modalities   Modalities Electrical Stimulation;Vasopneumatic   Electrical Stimulation   Electrical Stimulation Location Right knee.   Electrical Stimulation Action IFC   Electrical Stimulation Parameters 1-10 Hz x15 min   Electrical Stimulation Goals Edema;Pain   Vasopneumatic   Number Minutes Vasopneumatic  15 minutes   Vasopnuematic Location  Knee   Vasopneumatic Pressure Medium    Vasopneumatic Temperature  48   Manual Therapy   Manual Therapy Passive ROM;Soft tissue mobilization   Soft tissue mobilization R patellar mobilizations sup/inf, L/R, tilts   Passive ROM R knee into flex/ext with gentle holds at end range                  PT Short Term Goals - 02/28/15 1112    PT SHORT TERM GOAL #1   Title Ind with advanced HEP.   Time 6   Period Weeks   Status On-going   PT SHORT TERM GOAL #2   Title Full active right knee extension in order to normalize gait.   Time 6   Period Weeks   Status On-going   PT SHORT TERM GOAL #3   Title Active knee flexion to 115 degrees+ so the patient can perform functional tasks and do so with pain not > 2-3/10.   Time 6   Period Weeks   Status On-going   PT SHORT TERM GOAL #4   Title Increase right  knee strength to a solid 4+/5 to provide good stability for accomplishment of functional activities   Time 6   Period Weeks   Status On-going   PT SHORT TERM GOAL #5   Title Perform a reciprocating stair gait with one railing with pain not > 3/10.   Time 6   Period Weeks   Status On-going   PT SHORT TERM GOAL #6   Title Perform ADL's with pain not > 3/10.   Time 6   Period Weeks   Status On-going                  Plan - 02/28/15 1112    Clinical Impression Statement Patient tolerated treatment well today without verbal complaints of increased pain verbalized to PTA. All goals remain on-going secondary to decreased R knee AROM, stregnth, inability to ambulate stairs. Very firm end feels continue to be noted during PROM of the R knee. PROM of R knee measured at 1- 79 deg today in clinic. Normal modalties response noted following removal of the modalties to decrease pain and edema. Experienced 5/10 pain following treatment.   Pt will benefit from skilled therapeutic intervention in order to improve on the following deficits Abnormal gait;Decreased activity tolerance;Decreased range of motion;Decreased  strength;Pain;Increased edema   Rehab Potential Good   PT Frequency 3x / week   PT Duration 6 weeks   PT Treatment/Interventions  ADLs/Self Care Home Management;Cryotherapy;Occupational psychologist;Therapeutic activities;Therapeutic exercise;Neuromuscular re-education;Patient/family education;Manual techniques;Passive range of motion;Vasopneumatic Device   PT Next Visit Plan Continue per MPT POC and TKR protocol. Sees Dr. Aline Brochure tomorrow.   Consulted and Agree with Plan of Care Patient        Problem List Patient Active Problem List   Diagnosis Date Noted  . Arthritis of knee, right 02/02/2015  . Primary osteoarthritis of right knee   . Pulmonary embolus 01/09/2015  . UTI (lower urinary tract infection) 02/21/2014  . Other headache syndrome 02/21/2014  . Nausea with vomiting 02/21/2014  . Rheumatoid arthritis 02/21/2014  . Nausea & vomiting 02/21/2014  . Headache(784.0) 02/21/2014  . Lumbar spinal stenosis 02/10/2014    Ahmed Prima, PTA 02/28/2015 11:29 AM  [facility] [address] [place], [place], [address] Phone: [phone]   Fax:  [phone]

## 2015-03-01 ENCOUNTER — Ambulatory Visit (INDEPENDENT_AMBULATORY_CARE_PROVIDER_SITE_OTHER): Payer: 59 | Admitting: Orthopedic Surgery

## 2015-03-01 VITALS — BP 115/79 | Ht 75.0 in | Wt 285.0 lb

## 2015-03-01 DIAGNOSIS — Z471 Aftercare following joint replacement surgery: Secondary | ICD-10-CM

## 2015-03-01 DIAGNOSIS — M1711 Unilateral primary osteoarthritis, right knee: Secondary | ICD-10-CM

## 2015-03-01 DIAGNOSIS — Z96698 Presence of other orthopedic joint implants: Secondary | ICD-10-CM

## 2015-03-01 DIAGNOSIS — Z9660 Presence of unspecified orthopedic joint implant: Secondary | ICD-10-CM

## 2015-03-01 DIAGNOSIS — Z981 Arthrodesis status: Secondary | ICD-10-CM

## 2015-03-01 NOTE — Progress Notes (Signed)
Patient ID: Willie Howe, male   DOB: [birth year], 54 y.o.   MRN: [id number]  Follow up visit  Chief Complaint  Patient presents with  . Follow-up    2 week recheck on right total knee replacement, DOS 01-31-15.    BP 115/79 mmHg  Ht 6\' 3"  (1.905 m)  Wt 285 lb (129.275 kg)  BMI 35.62 kg/m2  Encounter Diagnoses  Name Primary?  . Primary osteoarthritis of knee, right Yes  . S/P lumbar spinal fusion   . Aftercare following other joint replacement surgery     [name] is now 27 days postop from a right total knee his range of motion is 5-80 and I can get him to about 85 he is to continue therapy his INR is 1.4 and he is on 2.5 a Coumadin I would like Dr[facility] Tollie Pizza [facility] to get another INR on him next week so that I can adjust his medicine if necessary. On July 29 her thereafter he can have this filter removed.  Follow-up August 4 continue therapy

## 2015-03-02 ENCOUNTER — Ambulatory Visit: Payer: 59 | Admitting: *Deleted

## 2015-03-02 ENCOUNTER — Encounter: Payer: Self-pay | Admitting: *Deleted

## 2015-03-02 DIAGNOSIS — M25561 Pain in right knee: Secondary | ICD-10-CM | POA: Diagnosis not present

## 2015-03-02 DIAGNOSIS — M25661 Stiffness of right knee, not elsewhere classified: Secondary | ICD-10-CM

## 2015-03-02 NOTE — Therapy (Signed)
[facility] [place], [place], [address] Phone: [phone]   Fax:  [phone]  Physical Therapy Treatment  Patient Details  Name: Willie Howe MRN: [id number] Date of Birth: 1961/07/04 Referring Provider:  Stephens Shire, MD  Encounter Date: 03/02/2015      PT End of Session - 03/02/15 1041    Visit Number 4   Number of Visits 18   Date for PT Re-Evaluation 04/20/15   PT Start Time 3664   PT Stop Time 1129   PT Time Calculation (min) 57 min      Past Medical History  Diagnosis Date  . Thyroid disease     Graves Disease  . Previous back surgery     x 3  . Dysrhythmia   . Anginal pain   . DVT (deep venous thrombosis)   . History of pulmonary embolism   . Graves disease 1996  . Kidney stone   . Arthritis     Past Surgical History  Procedure Laterality Date  . Knee surgery      x 2  . Nephrolithotomy    . Back surgery    . Colonoscopy    . Lumbar wound debridement N/A 02/23/2014    Procedure: LUMBAR WOUND DEBRIDEMENT;  Surgeon: Faythe Ghee, MD;  Location: [place];  Service: Neurosurgery;  Laterality: N/A;  exploration lumbar wound. repair of dural defect.  Mar[facility] Kitchen Spine surgery  2015    Dr Hal Neer Fusion   . Spine surgery  1999    discectomy  . Knee surgery Left 1976    ?ligament repair   . Menisectomy Right     open medial   . Knee arthroscopy Right 2006    meniscus   . Peripheral vascular catheterization N/A 01/30/2015    Procedure: IVC Filter Insertion;  Surgeon: Serafina Mitchell, MD;  Location: [place] [facility];  Service: Cardiovascular;  Laterality: N/A;  . Ivc filter    . Total knee arthroplasty Right 02/02/2015    Procedure:  RIGHT TOTAL KNEE ARTHROPLASTY;  Surgeon: Carole Civil, MD;  Location: [facility];  Service: Orthopedics;  Laterality: Right;  LM with pt's daughter of new arrival time (10:45)      There were no vitals filed for this visit.  Visit Diagnosis:  Right knee pain  Knee  stiffness, right      Subjective Assessment - 03/02/15 1040    Subjective Reports that he continues to have swelling. Statest that the medial R knee gives more issues. Reprots trying to be more active to avoid stiffness. Has found a comfortable positionfor sleeping in bed.   Patient Stated Goals Reduce my right knee pain and move better.   Currently in Pain? Yes   Pain Score 5    Pain Location Knee   Pain Orientation Right   Pain Descriptors / Indicators Sore   Pain Type Surgical pain   Pain Frequency Constant                         [facility] Adult PT Treatment/Exercise - 03/02/15 0001    Exercises   Exercises Knee/Hip   Knee/Hip Exercises: Aerobic   Nustep L5 x18 min for ROM progression, seat 15,14   Knee/Hip Exercises: Standing   Knee Flexion AROM;Right;2 sets;10 reps;Other (comment)  3 sec hold on 8"step   Rocker Board 3 minutes   Modalities   Modalities Education officer, environmental  Stimulation Location Right knee.   Electrical Stimulation Action IFC x 15 min 1-10 hz   Electrical Stimulation Goals Edema;Pain   Vasopneumatic   Number Minutes Vasopneumatic  15 minutes   Vasopnuematic Location  Knee   Vasopneumatic Pressure Medium   Vasopneumatic Temperature  48   Manual Therapy   Manual Therapy Passive ROM;Soft tissue mobilization   Soft tissue mobilization R patellar mobilizations sup/inf, L/R, tilts, and IASTM around scar   Passive ROM Contract/relax used for flexion stretching with pt sitting                  PT Short Term Goals - 02/28/15 1112    PT SHORT TERM GOAL #1   Title Ind with advanced HEP.   Time 6   Period Weeks   Status On-going   PT SHORT TERM GOAL #2   Title Full active right knee extension in order to normalize gait.   Time 6   Period Weeks   Status On-going   PT SHORT TERM GOAL #3   Title Active knee flexion to 115 degrees+ so the patient can perform functional tasks and do so with pain  not > 2-3/10.   Time 6   Period Weeks   Status On-going   PT SHORT TERM GOAL #4   Title Increase right  knee strength to a solid 4+/5 to provide good stability for accomplishment of functional activities   Time 6   Period Weeks   Status On-going   PT SHORT TERM GOAL #5   Title Perform a reciprocating stair gait with one railing with pain not > 3/10.   Time 6   Period Weeks   Status On-going   PT SHORT TERM GOAL #6   Title Perform ADL's with pain not > 3/10.   Time 6   Period Weeks   Status On-going                  Plan - 03/02/15 1042    Clinical Impression Statement Pt did fairly well with Rx today,  but his flexion ROM is still lacking. We were able to get 82 degrees of flexion after stretching and goal is on-going. Pt knows the importance of his HEP for ROM progression and states he will continue to sytretch at home.   Pt will benefit from skilled therapeutic intervention in order to improve on the following deficits Abnormal gait;Decreased activity tolerance;Decreased range of motion;Decreased strength;Pain;Increased edema   Rehab Potential Good   PT Frequency 3x / week   PT Duration 6 weeks   PT Treatment/Interventions Moist Heat   PT Next Visit Plan Continue per MPT POC and TKR protocol.         Problem List Patient Active Problem List   Diagnosis Date Noted  . Arthritis of knee, right 02/02/2015  . Primary osteoarthritis of right knee   . Pulmonary embolus 01/09/2015  . UTI (lower urinary tract infection) 02/21/2014  . Other headache syndrome 02/21/2014  . Nausea with vomiting 02/21/2014  . Rheumatoid arthritis 02/21/2014  . Nausea & vomiting 02/21/2014  . Headache(784.0) 02/21/2014  . Lumbar spinal stenosis 02/10/2014    Jacorian Golaszewski,CHRIS, PTA 03/02/2015, 12:32 PM  [facility] [place], [place], [address] Phone: [phone]   Fax:  [phone]

## 2015-03-05 ENCOUNTER — Ambulatory Visit: Payer: 59 | Admitting: Physical Therapy

## 2015-03-05 ENCOUNTER — Encounter: Payer: Self-pay | Admitting: Physical Therapy

## 2015-03-05 DIAGNOSIS — M25661 Stiffness of right knee, not elsewhere classified: Secondary | ICD-10-CM

## 2015-03-05 DIAGNOSIS — M25561 Pain in right knee: Secondary | ICD-10-CM | POA: Diagnosis not present

## 2015-03-05 NOTE — Therapy (Signed)
[facility] [place], [place], [address] Phone: [phone]   Fax:  [phone]  Physical Therapy Treatment  Patient Details  Name: Willie Howe MRN: [id number] Date of Birth: 02/02/1961 Referring Provider:  Stephens Shire, MD  Encounter Date: 03/05/2015      PT End of Session - 03/05/15 1034    Visit Number 5   Number of Visits 18   Date for PT Re-Evaluation 04/20/15   PT Start Time 1031   PT Stop Time 1127   PT Time Calculation (min) 56 min   Equipment Utilized During Treatment Other (comment)  SPC   Activity Tolerance Patient tolerated treatment well   Behavior During Therapy [facility] for tasks assessed/performed      Past Medical History  Diagnosis Date  . Thyroid disease     Graves Disease  . Previous back surgery     x 3  . Dysrhythmia   . Anginal pain   . DVT (deep venous thrombosis)   . History of pulmonary embolism   . Graves disease 1996  . Kidney stone   . Arthritis     Past Surgical History  Procedure Laterality Date  . Knee surgery      x 2  . Nephrolithotomy    . Back surgery    . Colonoscopy    . Lumbar wound debridement N/A 02/23/2014    Procedure: LUMBAR WOUND DEBRIDEMENT;  Surgeon: Faythe Ghee, MD;  Location: [facility];  Service: Neurosurgery;  Laterality: N/A;  exploration lumbar wound. repair of dural defect.  Marland Kitchen Spine surgery  2015    Dr Hal Neer Fusion   . Spine surgery  1999    discectomy  . Knee surgery Left 1976    ?ligament repair   . Menisectomy Right     open medial   . Knee arthroscopy Right 2006    meniscus   . Peripheral vascular catheterization N/A 01/30/2015    Procedure: IVC Filter Insertion;  Surgeon: Serafina Mitchell, MD;  Location: [facility];  Service: Cardiovascular;  Laterality: N/A;  . Ivc filter    . Total knee arthroplasty Right 02/02/2015    Procedure:  RIGHT TOTAL KNEE ARTHROPLASTY;  Surgeon: Carole Civil, MD;  Location: [facility];  Service:  Orthopedics;  Laterality: Right;  LM with pt's daughter of new arrival time (10:45)      There were no vitals filed for this visit.  Visit Diagnosis:  Right knee pain  Knee stiffness, right      Subjective Assessment - 03/05/15 1034    Subjective Reports that MD was pleased with progress but continues to have pain in the medial knee and swelling as well.   Patient Stated Goals Reduce my right knee pain and move better.   Currently in Pain? Yes   Pain Score 5    Pain Location Knee   Pain Orientation Right   Pain Descriptors / Indicators Sore   Pain Type Surgical pain            [facility] PT Assessment - 03/05/15 0001    Assessment   Medical Diagnosis Right total knee replacement.   Onset Date/Surgical Date 02/02/15   Next MD Visit 03/22/2015   ROM / Strength   AROM / PROM / Strength PROM   PROM   Overall PROM  Deficits   PROM Assessment Site Knee   Right/Left Knee Right   Right Knee   Right Knee Extension 0  Right Knee Flexion 87                     [facility] Adult PT Treatment/Exercise - 03/05/15 0001    Knee/Hip Exercises: Stretches   Active Hamstring Stretch Right;3 reps;30 seconds   Knee/Hip Exercises: Aerobic   Nustep L5 x12 min for ROM progression seat    Knee/Hip Exercises: Standing   Knee Flexion AROM;Right;2 sets;10 reps;Other (comment)  on 8" step   Rocker Board 3 minutes   Modalities   Modalities Electrical Stimulation;Vasopneumatic   Electrical Stimulation   Electrical Stimulation Location Right knee.   Electrical Stimulation Action IFC   Electrical Stimulation Parameters 1-10 Hz x15 min   Electrical Stimulation Goals Edema;Pain   Vasopneumatic   Number Minutes Vasopneumatic  15 minutes   Vasopnuematic Location  Knee   Vasopneumatic Pressure Medium   Manual Therapy   Manual Therapy Passive ROM;Soft tissue mobilization   Soft tissue mobilization R patellar mobilizations sup/inf, L/R, tilts, and IASTM around scar   Passive ROM R knee into  flex/ext with gentle holds at end range; R knee contract/relax to increase flexion x5 reps                  PT Short Term Goals - 03/05/15 1117    PT SHORT TERM GOAL #1   Title Ind with advanced HEP.   Time 6   Period Weeks   Status On-going   PT SHORT TERM GOAL #2   Title Full active right knee extension in order to normalize gait.   Time 6   Period Weeks   Status On-going   PT SHORT TERM GOAL #3   Title Active knee flexion to 115 degrees+ so the patient can perform functional tasks and do so with pain not > 2-3/10.   Time 6   Period Weeks   Status On-going   PT SHORT TERM GOAL #4   Title Increase right  knee strength to a solid 4+/5 to provide good stability for accomplishment of functional activities   Time 6   Period Weeks   Status On-going   PT SHORT TERM GOAL #5   Title Perform a reciprocating stair gait with one railing with pain not > 3/10.   Time 6   Period Weeks   Status On-going   PT SHORT TERM GOAL #6   Title Perform ADL's with pain not > 3/10.   Time 6   Period Weeks   Status On-going                  Plan - 03/05/15 1114    Clinical Impression Statement Patient did well with treatment today and is making improvements regarding ROM. R knee PROM measured as 0- 87 deg today in clinic. Very firm end feel noted in supine PROM into flexion as well as contract/relax to increase flexion. Ambulated into therapy gym today utilizing [place].  No goals met today secondary to decreased ROM, strength, inability to ascend stairs. Normal modalties response noted following removal of the modalities.    Pt will benefit from skilled therapeutic intervention in order to improve on the following deficits Abnormal gait;Decreased activity tolerance;Decreased range of motion;Decreased strength;Pain;Increased edema   Rehab Potential Good   PT Frequency 3x / week   PT Duration 6 weeks   PT Next Visit Plan Continue per MPT POC and TKR protocol.    Consulted and Agree  with Plan of Care Patient  Problem List Patient Active Problem List   Diagnosis Date Noted  . Arthritis of knee, right 02/02/2015  . Primary osteoarthritis of right knee   . Pulmonary embolus 01/09/2015  . UTI (lower urinary tract infection) 02/21/2014  . Other headache syndrome 02/21/2014  . Nausea with vomiting 02/21/2014  . Rheumatoid arthritis 02/21/2014  . Nausea & vomiting 02/21/2014  . Headache(784.0) 02/21/2014  . Lumbar spinal stenosis 02/10/2014    Wynelle Fanny, PTA 03/05/2015, 12:09 PM  [facility] [address] [place], [place], [address] Phone: [phone]   Fax:  [phone]

## 2015-03-07 ENCOUNTER — Ambulatory Visit: Payer: 59 | Admitting: Physical Therapy

## 2015-03-07 DIAGNOSIS — M25561 Pain in right knee: Secondary | ICD-10-CM | POA: Diagnosis not present

## 2015-03-07 DIAGNOSIS — M25661 Stiffness of right knee, not elsewhere classified: Secondary | ICD-10-CM

## 2015-03-07 NOTE — Patient Instructions (Signed)
  Iliotibial Band Stretch, Side-Lying   Lie on side, back to edge of bed, top arm in front. Allow top leg to drape behind over edge. Hold _30__ seconds to 5 minutes.  Repeat __3_ times per session. Do __2_ sessions per day.   Madelyn Flavors, PT 03/07/2015 11:19 AM [facility] [place], [place], [address] Phone: [phone]   Fax:  [phone]

## 2015-03-07 NOTE — Therapy (Signed)
[facility] [place], [place], [address] Phone: [phone]   Fax:  [phone]  Physical Therapy Treatment  Patient Details  Name: Willie Howe MRN: [id number] Date of Birth: 1960/11/02 Referring Provider:  Stephens Shire, MD  Encounter Date: 03/07/2015      PT End of Session - 03/07/15 1031    Visit Number 6   Number of Visits 18   Date for PT Re-Evaluation 04/20/15   PT Start Time 1031   PT Stop Time 1132   PT Time Calculation (min) 61 min   Activity Tolerance Patient tolerated treatment well   Behavior During Therapy [facility] for tasks assessed/performed      Past Medical History  Diagnosis Date  . Thyroid disease     Graves Disease  . Previous back surgery     x 3  . Dysrhythmia   . Anginal pain   . DVT (deep venous thrombosis)   . History of pulmonary embolism   . Graves disease 1996  . Kidney stone   . Arthritis     Past Surgical History  Procedure Laterality Date  . Knee surgery      x 2  . Nephrolithotomy    . Back surgery    . Colonoscopy    . Lumbar wound debridement N/A 02/23/2014    Procedure: LUMBAR WOUND DEBRIDEMENT;  Surgeon: Faythe Ghee, MD;  Location: [facility];  Service: Neurosurgery;  Laterality: N/A;  exploration lumbar wound. repair of dural defect.  Marland Kitchen Spine surgery  2015    Dr Hal Neer Fusion   . Spine surgery  1999    discectomy  . Knee surgery Left 1976    ?ligament repair   . Menisectomy Right     open medial   . Knee arthroscopy Right 2006    meniscus   . Peripheral vascular catheterization N/A 01/30/2015    Procedure: IVC Filter Insertion;  Surgeon: Serafina Mitchell, MD;  Location: [facility];  Service: Cardiovascular;  Laterality: N/A;  . Ivc filter    . Total knee arthroplasty Right 02/02/2015    Procedure:  RIGHT TOTAL KNEE ARTHROPLASTY;  Surgeon: Carole Civil, MD;  Location: [facility];  Service: Orthopedics;  Laterality: Right;  LM with pt's daughter of new arrival  time (10:45)      There were no vitals filed for this visit.  Visit Diagnosis:  Knee stiffness, right  Right knee pain      Subjective Assessment - 03/07/15 1042    Subjective I find it easier to move around, but the motion doesn't seem to stay. I've noticed the swelling has gone down some. I've been icing it regularly.   Currently in Pain? Yes   Pain Score 5                          [facility] Adult PT Treatment/Exercise - 03/07/15 0001    Knee/Hip Exercises: Aerobic   Nustep L6 x12 min for ROM progression seat    Knee/Hip Exercises: Standing   Knee Flexion AROM;Right;2 sets;10 reps;Other (comment)  on 8" step   Vasopneumatic   Number Minutes Vasopneumatic  15 minutes   Vasopnuematic Location  Knee   Vasopneumatic Pressure Medium   Vasopneumatic Temperature  3*   Manual Therapy   Manual Therapy Soft tissue mobilization;Myofascial release;Passive ROM   Manual therapy comments MFR to Right ITB in sidelying with passive stretch   Soft tissue mobilization  R patellar mobilizations sup/inf, L/R, tilts, and IASTM around scar   Passive ROM R knee into flex/ext with gentle holds at end range                PT Education - 03/07/15 1435    Education provided Yes   Education Details hep: s/l ITB stretch   Person(s) Educated Patient   Methods Explanation;Demonstration;Handout   Comprehension Verbalized understanding;Returned demonstration          PT Short Term Goals - 03/05/15 1117    PT SHORT TERM GOAL #1   Title Ind with advanced HEP.   Time 6   Period Weeks   Status On-going   PT SHORT TERM GOAL #2   Title Full active right knee extension in order to normalize gait.   Time 6   Period Weeks   Status On-going   PT SHORT TERM GOAL #3   Title Active knee flexion to 115 degrees+ so the patient can perform functional tasks and do so with pain not > 2-3/10.   Time 6   Period Weeks   Status On-going   PT SHORT TERM GOAL #4   Title Increase right   knee strength to a solid 4+/5 to provide good stability for accomplishment of functional activities   Time 6   Period Weeks   Status On-going   PT SHORT TERM GOAL #5   Title Perform a reciprocating stair gait with one railing with pain not > 3/10.   Time 6   Period Weeks   Status On-going   PT SHORT TERM GOAL #6   Title Perform ADL's with pain not > 3/10.   Time 6   Period Weeks   Status On-going                  Plan - 03/07/15 1435    Clinical Impression Statement Patient demonstrates tight right ITB which may be limiting knee ROM. Tolerated MFR without increased knee pain. Advised greater and more frequent elevation with ice at home due to pitting edema that remains.    PT Next Visit Plan Continue ROM, MRF to right ITB and modalities for pain and edema.        Problem List Patient Active Problem List   Diagnosis Date Noted  . Arthritis of knee, right 02/02/2015  . Primary osteoarthritis of right knee   . Pulmonary embolus 01/09/2015  . UTI (lower urinary tract infection) 02/21/2014  . Other headache syndrome 02/21/2014  . Nausea with vomiting 02/21/2014  . Rheumatoid arthritis 02/21/2014  . Nausea & vomiting 02/21/2014  . Headache(784.0) 02/21/2014  . Lumbar spinal stenosis 02/10/2014    Madelyn Flavors PT  03/07/2015, 2:40 PM  [facility] [place], [place], [address] Phone: [phone]   Fax:  [phone]

## 2015-03-08 ENCOUNTER — Other Ambulatory Visit: Payer: Self-pay | Admitting: *Deleted

## 2015-03-08 ENCOUNTER — Telehealth: Payer: Self-pay | Admitting: Orthopedic Surgery

## 2015-03-08 MED ORDER — HYDROCODONE-ACETAMINOPHEN 10-325 MG PO TABS: 1.0000 | ORAL_TABLET | ORAL | Status: DC | PRN

## 2015-03-08 MED ORDER — WARFARIN SODIUM 5 MG PO TABS: 5.0000 mg | ORAL_TABLET | Freq: Every day | ORAL | Status: DC

## 2015-03-08 NOTE — Telephone Encounter (Signed)
Call from patient - reports that he was seen yesterday by his primary care, Dr. Marlyn Corporal, [facility] [place], [phone]), and his Pro-time/I & R was 1.2 *  Patient also is asking for refill of medication: Warfarin (COUMADIN) 2.5 MG tablet [[id number]], although states that Dr. Marlyn Corporal recommended 5.0 MG (patient's pharmacy is [facility], [place]) * I have contacted this office and have requested the lab report and office notes; Lab report received and in Dr's box.  Patient's ph# is H# [phone]; Cell# [phone]

## 2015-03-08 NOTE — Telephone Encounter (Signed)
Patient picked up Rx

## 2015-03-08 NOTE — Telephone Encounter (Signed)
Per Dr Aline Brochure increase coumadin to 5mg  daily, ordered at pharmacy and patient is aware  Recheck pt inr 2 weeks

## 2015-03-09 ENCOUNTER — Ambulatory Visit: Payer: 59 | Admitting: Physical Therapy

## 2015-03-09 ENCOUNTER — Encounter: Payer: Self-pay | Admitting: Physical Therapy

## 2015-03-09 DIAGNOSIS — M25561 Pain in right knee: Secondary | ICD-10-CM | POA: Diagnosis not present

## 2015-03-09 DIAGNOSIS — M25661 Stiffness of right knee, not elsewhere classified: Secondary | ICD-10-CM

## 2015-03-09 NOTE — Therapy (Signed)
[facility] [place], [place], [address] Phone: [phone]   Fax:  [phone]  Physical Therapy Treatment  Patient Details  Name: Willie Howe MRN: [id number] Date of Birth: 14-Oct-1960 Referring Provider:  Stephens Shire, MD  Encounter Date: 03/09/2015      PT End of Session - 03/09/15 1031    Visit Number 7   Number of Visits 18   Date for PT Re-Evaluation 04/20/15   PT Start Time 1027   PT Stop Time 1123   PT Time Calculation (min) 56 min   Equipment Utilized During Treatment Other (comment)  SPC   Activity Tolerance Patient tolerated treatment well   Behavior During Therapy [facility] for tasks assessed/performed      Past Medical History  Diagnosis Date  . Thyroid disease     Graves Disease  . Previous back surgery     x 3  . Dysrhythmia   . Anginal pain   . DVT (deep venous thrombosis)   . History of pulmonary embolism   . Graves disease 1996  . Kidney stone   . Arthritis     Past Surgical History  Procedure Laterality Date  . Knee surgery      x 2  . Nephrolithotomy    . Back surgery    . Colonoscopy    . Lumbar wound debridement N/A 02/23/2014    Procedure: LUMBAR WOUND DEBRIDEMENT;  Surgeon: Faythe Ghee, MD;  Location: [facility];  Service: Neurosurgery;  Laterality: N/A;  exploration lumbar wound. repair of dural defect.  Marland Kitchen Spine surgery  2015    Dr Hal Neer Fusion   . Spine surgery  1999    discectomy  . Knee surgery Left 1976    ?ligament repair   . Menisectomy Right     open medial   . Knee arthroscopy Right 2006    meniscus   . Peripheral vascular catheterization N/A 01/30/2015    Procedure: IVC Filter Insertion;  Surgeon: Serafina Mitchell, MD;  Location: [facility];  Service: Cardiovascular;  Laterality: N/A;  . Ivc filter    . Total knee arthroplasty Right 02/02/2015    Procedure:  RIGHT TOTAL KNEE ARTHROPLASTY;  Surgeon: Carole Civil, MD;  [facility]: [facility];  Service:  Orthopedics;  Laterality: Right;  LM with pt's daughter of new arrival time (10:45)      There were no vitals filed for this visit.  Visit Diagnosis:  Right knee pain  Knee stiffness, right      Subjective Assessment - 03/09/15 1030    Subjective States that he is really sore/tender this morning and does not know if he might have slept wrong but was really active yesterday and walking.   Patient Stated Goals Reduce my right knee pain and move better.   Currently in Pain? Yes   Pain Score 6    Pain Location Knee   Pain Orientation Right   Pain Descriptors / Indicators Sore;Tender   Pain Type Surgical pain            [facility] PT Assessment - 03/09/15 0001    Assessment   Medical Diagnosis Right total knee replacement.   Onset Date/Surgical Date 02/02/15   Next MD Visit 03/22/2015   ROM / Strength   AROM / PROM / Strength AROM   AROM   Overall AROM  Deficits   AROM Assessment Site Knee   Right/Left Knee Right   Right Knee Extension 0  Right Knee Flexion 80                     [facility] Adult PT Treatment/Exercise - 03/09/15 0001    Knee/Hip Exercises: Aerobic   Nustep L6 x12 min   Knee/Hip Exercises: Standing   Knee Flexion AROM;Right;2 sets;10 reps;Other (comment)  8" step   Forward Step Up Right;2 sets;10 reps;Hand Hold: 2;Step Height: 6"   Rocker Board 3 minutes   Knee/Hip Exercises: Seated   Other Seated Knee/Hip Exercises Seated R contract/relax x5 reps 30 sec each to increase R knee flexion   Modalities   Modalities Electrical Stimulation;Vasopneumatic   Electrical Stimulation   Electrical Stimulation Location Right knee.   Electrical Stimulation Action IFC   Electrical Stimulation Parameters 1-10 Hz x15 min   Electrical Stimulation Goals Edema;Pain   Vasopneumatic   Number Minutes Vasopneumatic  15 minutes   Vasopnuematic Location  Knee   Vasopneumatic Pressure Medium   Manual Therapy   Manual Therapy Passive ROM;Soft tissue mobilization   Soft  tissue mobilization R patellar mobilizations into L/R, superior/inferior, tilts   Passive ROM R knee into flex/ext with gentle holds at end range                PT Education - 03/09/15 1114    Education provided Yes   Education Details HEP- forward lunges on stairs to increase R knee flexion; start with 20 reps 2-3 times per day but increase to 30 reps 2-3 times a day once 20 reps came easier   Person(s) Educated Patient   Methods Explanation;Verbal cues;Handout   Comprehension Verbalized understanding;Verbal cues required          PT Short Term Goals - 03/09/15 1112    PT SHORT TERM GOAL #1   Title Ind with advanced HEP.   Time 6   Period Weeks   Status On-going   PT SHORT TERM GOAL #2   Title Full active right knee extension in order to normalize gait.   Time 6   Period Weeks   Status Achieved  AROM 0 deg ext 03/09/2015   PT SHORT TERM GOAL #3   Title Active knee flexion to 115 degrees+ so the patient can perform functional tasks and do so with pain not > 2-3/10.   Time 6   Period Weeks   Status On-going   PT SHORT TERM GOAL #4   Title Increase right  knee strength to a solid 4+/5 to provide good stability for accomplishment of functional activities   Time 6   Period Weeks   Status On-going   PT SHORT TERM GOAL #5   Title Perform a reciprocating stair gait with one railing with pain not > 3/10.   Time 6   Period Weeks   Status On-going   PT SHORT TERM GOAL #6   Title Perform ADL's with pain not > 3/10.   Time 6   Period Weeks   Status On-going                  Plan - 03/09/15 1115    Clinical Impression Statement Patient tolerated treatment although he had soreness and tenderness. Achieved full R knee extension today in clinic meeting the goal but flexion decreased possibly due to soreness and increased activity yesterday. Very firm end feels noted during R knee contract/relax and during PROM into flexion today. Normal modalities response noted  following removal of the modalities. Accpeted new HEP to increase R knee flexion.  Experienced 5/10 pain following treatment today.   Pt will benefit from skilled therapeutic intervention in order to improve on the following deficits Abnormal gait;Decreased activity tolerance;Decreased range of motion;Decreased strength;Pain;Increased edema   Rehab Potential Good   PT Frequency 3x / week   PT Duration 6 weeks   PT Treatment/Interventions Moist Heat   PT Next Visit Plan Continue ROM, MRF to right ITB and modalities for pain and edema. Continue focusing on R knee flexion.   Consulted and Agree with Plan of Care Patient        Problem List Patient Active Problem List   Diagnosis Date Noted  . Arthritis of knee, right 02/02/2015  . Primary osteoarthritis of right knee   . Pulmonary embolus 01/09/2015  . UTI (lower urinary tract infection) 02/21/2014  . Other headache syndrome 02/21/2014  . Nausea with vomiting 02/21/2014  . Rheumatoid arthritis 02/21/2014  . Nausea & vomiting 02/21/2014  . Headache(784.0) 02/21/2014  . Lumbar spinal stenosis 02/10/2014    Wynelle Fanny, PTA 03/09/2015, 11:27 AM  [facility] [address] [place], [place], [address] Phone: [phone]   Fax:  [phone]

## 2015-03-09 NOTE — Patient Instructions (Signed)
Forward Lunge   Standing with feet shoulder width apart and stomach tight, step forward with left leg. Repeat _10___ times per set. Do _2-3___ sets per session. Do _2-3___ sessions per day.  http://orth.exer.us/1146   Copyright  VHI. All rights reserved.

## 2015-03-13 ENCOUNTER — Ambulatory Visit: Payer: 59 | Admitting: Physical Therapy

## 2015-03-13 ENCOUNTER — Encounter: Payer: Self-pay | Admitting: Physical Therapy

## 2015-03-13 DIAGNOSIS — M25561 Pain in right knee: Secondary | ICD-10-CM | POA: Diagnosis not present

## 2015-03-13 DIAGNOSIS — M25661 Stiffness of right knee, not elsewhere classified: Secondary | ICD-10-CM

## 2015-03-13 NOTE — Therapy (Signed)
[facility] [place], [place], [address] Phone: [phone]   Fax:  [phone]  Physical Therapy Treatment  Patient Details  Name: Willie Howe MRN: [id number] Date of Birth: 10-02-1960 Referring Provider:  Stephens Shire, MD  Encounter Date: 03/13/2015      PT End of Session - 03/13/15 0853    Visit Number 8   Number of Visits 18   Date for PT Re-Evaluation 04/20/15   PT Start Time 0814   PT Stop Time 0911   PT Time Calculation (min) 57 min   Activity Tolerance Patient tolerated treatment well   Behavior During Therapy [facility] for tasks assessed/performed      Past Medical History  Diagnosis Date  . Thyroid disease     Graves Disease  . Previous back surgery     x 3  . Dysrhythmia   . Anginal pain   . DVT (deep venous thrombosis)   . History of pulmonary embolism   . Graves disease 1996  . Kidney stone   . Arthritis     Past Surgical History  Procedure Laterality Date  . Knee surgery      x 2  . Nephrolithotomy    . Back surgery    . Colonoscopy    . Lumbar wound debridement N/A 02/23/2014    Procedure: LUMBAR WOUND DEBRIDEMENT;  Surgeon: Faythe Ghee, MD;  Location: [facility];  Service: Neurosurgery;  Laterality: N/A;  exploration lumbar wound. repair of dural defect.  Marland Kitchen Spine surgery  2015    Dr Hal Neer Fusion   . Spine surgery  1999    discectomy  . Knee surgery Left 1976    ?ligament repair   . Menisectomy Right     open medial   . Knee arthroscopy Right 2006    meniscus   . Peripheral vascular catheterization N/A 01/30/2015    Procedure: IVC Filter Insertion;  Surgeon: Serafina Mitchell, MD;  Location: [facility];  Service: Cardiovascular;  Laterality: N/A;  . Ivc filter    . Total knee arthroplasty Right 02/02/2015    Procedure:  RIGHT TOTAL KNEE ARTHROPLASTY;  Surgeon: Carole Civil, MD;  Location: [facility];  Service: Orthopedics;  Laterality: Right;  LM with pt's daughter of new arrival  time (10:45)      There were no vitals filed for this visit.  Visit Diagnosis:  Right knee pain  Knee stiffness, right      Subjective Assessment - 03/13/15 0817    Subjective Patient reported knee being stiff today   Patient Stated Goals Reduce my right knee pain and move better.   Currently in Pain? Yes   Pain Score 6    Pain Location Knee   Pain Orientation Right   Pain Descriptors / Indicators Sore  stiff   Pain Type Surgical pain   Pain Frequency Constant   Aggravating Factors  ROM or prolong activity   Pain Relieving Factors rest            [facility] PT Assessment - 03/13/15 0001    AROM   Overall AROM  Within functional limits for tasks performed;Deficits   AROM Assessment Site Knee   Right/Left Knee Right   Right Knee Extension 0   Right Knee Flexion 75   PROM   Overall PROM  Deficits   Overall PROM Comments 90 degrees   PROM Assessment Site Knee   Right/Left Knee Right  [facility] Adult PT Treatment/Exercise - 03/13/15 0001    Knee/Hip Exercises: Stretches   Active Hamstring Stretch Right;3 reps;30 seconds   Knee/Hip Exercises: Aerobic   Nustep L6 x12 min   Knee/Hip Exercises: Standing   Forward Step Up Right;3 sets;10 reps;Step Height: 6"   Electrical Stimulation   Electrical Stimulation Location Right knee.   Chartered certified accountant IFC   Electrical Stimulation Parameters 1-10HZ    Electrical Stimulation Goals Edema;Pain   Vasopneumatic   Number Minutes Vasopneumatic  15 minutes   Vasopnuematic Location  Knee   Vasopneumatic Pressure Medium   Manual Therapy   Manual Therapy Passive ROM   Soft tissue mobilization --   Passive ROM low low holds for flexion with patient supine                  PT Short Term Goals - 03/09/15 1112    PT SHORT TERM GOAL #1   Title Ind with advanced HEP.   Time 6   Period Weeks   Status On-going   PT SHORT TERM GOAL #2   Title Full active right knee extension in order  to normalize gait.   Time 6   Period Weeks   Status Achieved  AROM 0 deg ext 03/09/2015   PT SHORT TERM GOAL #3   Title Active knee flexion to 115 degrees+ so the patient can perform functional tasks and do so with pain not > 2-3/10.   Time 6   Period Weeks   Status On-going   PT SHORT TERM GOAL #4   Title Increase right  knee strength to a solid 4+/5 to provide good stability for accomplishment of functional activities   Time 6   Period Weeks   Status On-going   PT SHORT TERM GOAL #5   Title Perform a reciprocating stair gait with one railing with pain not > 3/10.   Time 6   Period Weeks   Status On-going   PT SHORT TERM GOAL #6   Title Perform ADL's with pain not > 3/10.   Time 6   Period Weeks   Status On-going                  Plan - 03/13/15 0160    Clinical Impression Statement Patient tolerated treatment well today. Has improved with PROM today yet still very stiff knee end range. Patient reports doing self stretches at home. Unable to meet any further goals today due to strength, pain and ROM deficits.   Pt will benefit from skilled therapeutic intervention in order to improve on the following deficits Abnormal gait;Decreased activity tolerance;Decreased range of motion;Decreased strength;Pain;Increased edema   Rehab Potential Good   Clinical Impairments Affecting Rehab Potential Surgery June 17 - current 5 weeks 03/09/15   PT Frequency 3x / week   PT Duration 6 weeks   PT Next Visit Plan Cont with POC focus on knee flexion (MD 03/22/15)   Consulted and Agree with Plan of Care Patient        Problem List Patient Active Problem List   Diagnosis Date Noted  . Arthritis of knee, right 02/02/2015  . Primary osteoarthritis of right knee   . Pulmonary embolus 01/09/2015  . UTI (lower urinary tract infection) 02/21/2014  . Other headache syndrome 02/21/2014  . Nausea with vomiting 02/21/2014  . Rheumatoid arthritis 02/21/2014  . Nausea & vomiting 02/21/2014   . Headache(784.0) 02/21/2014  . Lumbar spinal stenosis 02/10/2014    Rishab Stoudt P, PTA  03/13/2015, 9:13 AM  [facility] [place], [place], [address] Phone: [phone]   Fax:  [phone]

## 2015-03-16 ENCOUNTER — Ambulatory Visit: Payer: 59 | Admitting: Physical Therapy

## 2015-03-16 ENCOUNTER — Encounter: Payer: Self-pay | Admitting: Physical Therapy

## 2015-03-16 DIAGNOSIS — M25561 Pain in right knee: Secondary | ICD-10-CM | POA: Diagnosis not present

## 2015-03-16 DIAGNOSIS — M25661 Stiffness of right knee, not elsewhere classified: Secondary | ICD-10-CM

## 2015-03-16 NOTE — Therapy (Signed)
[facility] [place], [place], [address] Phone: [phone]   Fax:  [phone]  Physical Therapy Treatment  Patient Details  Name: Willie Howe MRN: [id number] Date of Birth: 11-07-1960 Referring Provider:  Stephens Shire, MD  Encounter Date: 03/16/2015      PT End of Session - 03/16/15 0953    Visit Number 9   Number of Visits 18   Date for PT Re-Evaluation 04/20/15   PT Start Time 0945   PT Stop Time 2620   PT Time Calculation (min) 56 min   Equipment Utilized During Treatment Other (comment)  SPC   Activity Tolerance Patient tolerated treatment well   Behavior During Therapy [facility] for tasks assessed/performed      Past Medical History  Diagnosis Date  . Thyroid disease     Graves Disease  . Previous back surgery     x 3  . Dysrhythmia   . Anginal pain   . DVT (deep venous thrombosis)   . History of pulmonary embolism   . Graves disease 1996  . Kidney stone   . Arthritis     Past Surgical History  Procedure Laterality Date  . Knee surgery      x 2  . Nephrolithotomy    . Back surgery    . Colonoscopy    . Lumbar wound debridement N/A 02/23/2014    Procedure: LUMBAR WOUND DEBRIDEMENT;  Surgeon: Faythe Ghee, MD;  Location: [facility];  Service: Neurosurgery;  Laterality: N/A;  exploration lumbar wound. repair of dural defect.  Marland Kitchen Spine surgery  2015    Dr Hal Neer Fusion   . Spine surgery  1999    discectomy  . Knee surgery Left 1976    ?ligament repair   . Menisectomy Right     open medial   . Knee arthroscopy Right 2006    meniscus   . Peripheral vascular catheterization N/A 01/30/2015    Procedure: IVC Filter Insertion;  Surgeon: Serafina Mitchell, MD;  Location: [facility];  Service: Cardiovascular;  Laterality: N/A;  . Ivc filter    . Total knee arthroplasty Right 02/02/2015    Procedure:  RIGHT TOTAL KNEE ARTHROPLASTY;  Surgeon: Carole Civil, MD;  Location: [facility];  Service:  Orthopedics;  Laterality: Right;  LM with pt's daughter of new arrival time (10:45)      There were no vitals filed for this visit.  Visit Diagnosis:  Right knee pain  Knee stiffness, right      Subjective Assessment - 03/16/15 0952    Subjective States that he feels like he is at a threshold where he is improving with not as much swelling as before.   Patient Stated Goals Reduce my right knee pain and move better.   Currently in Pain? Yes   Pain Score 5    Pain Location Knee   Pain Orientation Right   Pain Descriptors / Indicators Tender   Pain Type Surgical pain            [facility] PT Assessment - 03/16/15 0001    Assessment   Medical Diagnosis Right total knee replacement.   Onset Date/Surgical Date 02/02/15   Next MD Visit 03/22/2015   ROM / Strength   AROM / PROM / Strength AROM;PROM   AROM   Overall AROM  Within functional limits for tasks performed;Deficits   AROM Assessment Site Knee   Right/Left Knee Right   Right Knee Extension 0  Right Knee Flexion 83   PROM   Overall PROM  Deficits   PROM Assessment Site Knee   Right/Left Knee Right   Right Knee   Right Knee Flexion 87                     [facility] Adult PT Treatment/Exercise - 03/16/15 0001    Knee/Hip Exercises: Aerobic   Nustep L6 x12 min   Knee/Hip Exercises: Standing   Forward Lunges Right;Other (comment)  x25 reps 14" step   Rocker Board 3 minutes   Knee/Hip Exercises: Supine   Other Supine Knee/Hip Exercises Supine RLE wall slides x10 reps  R knee flexion measured at 88 deg   Modalities   Modalities Electrical Stimulation;Vasopneumatic   Electrical Stimulation   Electrical Stimulation Location Right knee.   Electrical Stimulation Action IFC   Electrical Stimulation Parameters 1-10 Hz x15 min   Electrical Stimulation Goals Edema;Pain   Vasopneumatic   Number Minutes Vasopneumatic  15 minutes   Vasopnuematic Location  Knee   Vasopneumatic Pressure Medium   Vasopneumatic  Temperature  34   Manual Therapy   Manual Therapy Passive ROM;Soft tissue mobilization   Soft tissue mobilization R patellar mobilizations into L/R, superior/inferior, tilts, R scar mobilizations to decrease adhesions they may be present   Passive ROM R knee into flex/ext with gentle holds at end range                  PT Short Term Goals - 03/09/15 1112    PT SHORT TERM GOAL #1   Title Ind with advanced HEP.   Time 6   Period Weeks   Status On-going   PT SHORT TERM GOAL #2   Title Full active right knee extension in order to normalize gait.   Time 6   Period Weeks   Status Achieved  AROM 0 deg ext 03/09/2015   PT SHORT TERM GOAL #3   Title Active knee flexion to 115 degrees+ so the patient can perform functional tasks and do so with pain not > 2-3/10.   Time 6   Period Weeks   Status On-going   PT SHORT TERM GOAL #4   Title Increase right  knee strength to a solid 4+/5 to provide good stability for accomplishment of functional activities   Time 6   Period Weeks   Status On-going   PT SHORT TERM GOAL #5   Title Perform a reciprocating stair gait with one railing with pain not > 3/10.   Time 6   Period Weeks   Status On-going   PT SHORT TERM GOAL #6   Title Perform ADL's with pain not > 3/10.   Time 6   Period Weeks   Status On-going                  Plan - 03/16/15 1027    Clinical Impression Statement Patient tolerated treatment well with the initation of wall slides in efforts to increase R knee flexion. Very firm end feels noted during PROM of R knee into flexion. Slightly diminished R patellar mobility in sup/inf direction. No further goals were achieved today secondary to ROM,strength defiicts and pain. Normal modalities response noted following removal of the modalities. Experienced 5/10 pain following treatment.   Pt will benefit from skilled therapeutic intervention in order to improve on the following deficits Abnormal gait;Decreased activity  tolerance;Decreased range of motion;Decreased strength;Pain;Increased edema   Rehab Potential Good   Clinical Impairments Affecting  Rehab Potential Surgery June 17 - current 5 weeks 03/09/15   PT Frequency 3x / week   PT Duration 6 weeks   PT Treatment/Interventions Moist Heat   PT Next Visit Plan Cont with POC focus on knee flexion (MD 03/22/15)   Consulted and Agree with Plan of Care Patient        Problem List Patient Active Problem List   Diagnosis Date Noted  . Arthritis of knee, right 02/02/2015  . Primary osteoarthritis of right knee   . Pulmonary embolus 01/09/2015  . UTI (lower urinary tract infection) 02/21/2014  . Other headache syndrome 02/21/2014  . Nausea with vomiting 02/21/2014  . Rheumatoid arthritis 02/21/2014  . Nausea & vomiting 02/21/2014  . Headache(784.0) 02/21/2014  . Lumbar spinal stenosis 02/10/2014    Wynelle Fanny, PTA 03/16/2015, 10:43 AM  [facility] [address] [place], [place], [address] Phone: [phone]   Fax:  [phone]

## 2015-03-20 ENCOUNTER — Encounter: Payer: Self-pay | Admitting: *Deleted

## 2015-03-20 ENCOUNTER — Ambulatory Visit: Payer: 59 | Attending: Orthopedic Surgery | Admitting: *Deleted

## 2015-03-20 DIAGNOSIS — M25661 Stiffness of right knee, not elsewhere classified: Secondary | ICD-10-CM | POA: Diagnosis present

## 2015-03-20 DIAGNOSIS — M25561 Pain in right knee: Secondary | ICD-10-CM | POA: Diagnosis not present

## 2015-03-20 NOTE — Therapy (Signed)
[facility] [place], [place], [address] Phone: [phone]   Fax:  [phone]  Physical Therapy Treatment  Patient Details  Name: Willie Howe MRN: [id number] Date of Birth: 12-Nov-1960 Referring Provider:  Stephens Shire, MD  Encounter Date: 03/20/2015      PT End of Session - 03/20/15 0907    Visit Number 10   Number of Visits 18   Date for PT Re-Evaluation 04/20/15   PT Start Time 0900   PT Stop Time 0958   PT Time Calculation (min) 58 min      Past Medical History  Diagnosis Date  . Thyroid disease     Graves Disease  . Previous back surgery     x 3  . Dysrhythmia   . Anginal pain   . DVT (deep venous thrombosis)   . History of pulmonary embolism   . Graves disease 1996  . Kidney stone   . Arthritis     Past Surgical History  Procedure Laterality Date  . Knee surgery      x 2  . Nephrolithotomy    . Back surgery    . Colonoscopy    . Lumbar wound debridement N/A 02/23/2014    Procedure: LUMBAR WOUND DEBRIDEMENT;  Surgeon: Faythe Ghee, MD;  Location: [facility];  Service: Neurosurgery;  Laterality: N/A;  exploration lumbar wound. repair of dural defect.  Marland Kitchen Spine surgery  2015    Dr Hal Neer Fusion   . Spine surgery  1999    discectomy  . Knee surgery Left 1976    ?ligament repair   . Menisectomy Right     open medial   . Knee arthroscopy Right 2006    meniscus   . Peripheral vascular catheterization N/A 01/30/2015    Procedure: IVC Filter Insertion;  Surgeon: Serafina Mitchell, MD;  Location: [facility];  Service: Cardiovascular;  Laterality: N/A;  . Ivc filter    . Total knee arthroplasty Right 02/02/2015    Procedure:  RIGHT TOTAL KNEE ARTHROPLASTY;  Surgeon: Carole Civil, MD;  [facility]: [facility];  Service: Orthopedics;  Laterality: Right;  LM with pt's daughter of new arrival time (10:45)      There were no vitals filed for this visit.  Visit Diagnosis:  Right knee pain  Knee  stiffness, right      Subjective Assessment - 03/20/15 0904    Subjective RT knee is doing better. LT knee flared up some this weekend, but is better today   Patient Stated Goals Reduce my right knee pain and move better.   Currently in Pain? Yes   Pain Score 4    Pain Location Knee   Pain Orientation Right   Pain Descriptors / Indicators Tender   Pain Type Surgical pain   Pain Frequency Constant   Aggravating Factors  ROM and prolonged activity   Pain Relieving Factors rest                         [facility] Adult PT Treatment/Exercise - 03/20/15 0001    Exercises   Exercises Knee/Hip   Knee/Hip Exercises: Aerobic   Nustep L6 x15 min seat progression forward for ROM seat 15,14 try 13 next visit   Knee/Hip Exercises: Standing   Forward Lunges Right;Other (comment)  x25 reps 14" step   Forward Step Up Right;3 sets;10 reps;Step Height: 8"   Stairs Pt was able to negotiate steps up/down  with handrail with reciprocating pattern   Rocker Board 3 minutes   Electrical Stimulation   Electrical Stimulation Location Right knee.IFC x 15 mins 1-10hz    Electrical Stimulation Goals Edema;Pain   Vasopneumatic   Number Minutes Vasopneumatic  15 minutes   Vasopnuematic Location  Knee   Vasopneumatic Pressure Medium   Vasopneumatic Temperature  34   Manual Therapy   Manual Therapy Passive ROM;Soft tissue mobilization;Myofascial release   Soft tissue mobilization R patellar mobilizations into L/R, superior/inferior, tilts, R scar mobilizations to decrease adhesions they may be present   Myofascial Release IASTM to RT thigh and around incision while on flexion stretch in sitting   Passive ROM R knee into flex/ext with long holds at end range                                                                                                    RT knee ROM 0-92 degrees today             PT Short Term Goals - 03/20/15 4332    PT SHORT TERM GOAL #1   Title Ind with advanced  HEP.   Time 6   Period Weeks   Status On-going   PT SHORT TERM GOAL #2   Title Full active right knee extension in order to normalize gait.   Time 6   Status Achieved   PT SHORT TERM GOAL #3   Title Active knee flexion to 115 degrees+ so the patient can perform functional tasks and do so with pain not > 2-3/10.   Time 6   Period Weeks   Status On-going   PT SHORT TERM GOAL #4   Title Increase right  knee strength to a solid 4+/5 to provide good stability for accomplishment of functional activities   Time 6   Period Weeks   Status On-going   PT SHORT TERM GOAL #5   Title Perform a reciprocating stair gait with one railing with pain not > 3/10.   Time 6   Period Weeks   Status Achieved   PT SHORT TERM GOAL #6   Title Perform ADL's with pain not > 3/10.   Time 6   Period Weeks   Status On-going                  Plan - 03/20/15 1003    Clinical Impression Statement Pt did fairly well today with PT Rx and was able to meet LTG for negotiating stairs. His flexion ROM has improved to 92 degrees, but slowly progressing. He did well with STW and manual stretching. He continues to gain strength and balance with ex.'s act.'s, but ROM is of most concern   Pt will benefit from skilled therapeutic intervention in order to improve on the following deficits Abnormal gait;Decreased activity tolerance;Decreased range of motion;Decreased strength;Pain;Increased edema   Rehab Potential Good   Clinical Impairments Affecting Rehab Potential Surgery June 17 - current 5 weeks 03/09/15   PT Frequency 3x / week   PT Duration 6 weeks   PT Treatment/Interventions Cryotherapy;Electrical Stimulation;Therapeutic activities;Therapeutic exercise;Neuromuscular re-education;Patient/family  education;Manual techniques;Scar mobilization;Passive range of motion   PT Next Visit Plan Cont with POC focus on knee flexion (MD 03/22/15) send MD note   Consulted and Agree with Plan of Care Patient         Problem List Patient Active Problem List   Diagnosis Date Noted  . Arthritis of knee, right 02/02/2015  . Primary osteoarthritis of right knee   . Pulmonary embolus 01/09/2015  . UTI (lower urinary tract infection) 02/21/2014  . Other headache syndrome 02/21/2014  . Nausea with vomiting 02/21/2014  . Rheumatoid arthritis 02/21/2014  . Nausea & vomiting 02/21/2014  . Headache(784.0) 02/21/2014  . Lumbar spinal stenosis 02/10/2014   Levonne Lapping, PTA 03/20/2015 10:26 AM [place], [place], PTA 03/20/2015, 10:26 AM   Mali Applegate MPT  [facility] [address] [place], [place], [address] Phone: [phone]   Fax:  [phone]

## 2015-03-22 ENCOUNTER — Ambulatory Visit (INDEPENDENT_AMBULATORY_CARE_PROVIDER_SITE_OTHER): Payer: 59 | Admitting: Orthopedic Surgery

## 2015-03-22 VITALS — BP 133/88 | Ht 75.0 in | Wt 278.0 lb

## 2015-03-22 DIAGNOSIS — Z96651 Presence of right artificial knee joint: Secondary | ICD-10-CM

## 2015-03-22 DIAGNOSIS — Z96698 Presence of other orthopedic joint implants: Principal | ICD-10-CM

## 2015-03-22 DIAGNOSIS — Z9660 Presence of unspecified orthopedic joint implant: Secondary | ICD-10-CM

## 2015-03-22 DIAGNOSIS — Z471 Aftercare following joint replacement surgery: Secondary | ICD-10-CM

## 2015-03-22 NOTE — Patient Instructions (Signed)
Discontinue Warfarin, continue therapy

## 2015-03-22 NOTE — Progress Notes (Signed)
Patient ID: Willie Howe, male   DOB: December 11, 1960, 54 y.o.   MRN: [id number]  Follow up visit  Chief Complaint  Patient presents with  . Follow-up    3 week follow up Right TKA DOS 02/02/15    BP 133/88 mmHg  Ht 6\' 3"  (1.905 m)  Wt 278 lb (126.1 kg)  BMI 34.75 kg/m2  No diagnosis found.   S/p knee repalcement 6 weeks I d like to see 110   i ll see him in 6 weeks if not there we will rec knee MUA   He can stop warfarin  I ll let vascular know he can have filter out

## 2015-03-23 ENCOUNTER — Ambulatory Visit: Payer: 59 | Admitting: Physical Therapy

## 2015-03-23 ENCOUNTER — Encounter: Payer: Self-pay | Admitting: Physical Therapy

## 2015-03-23 DIAGNOSIS — M25561 Pain in right knee: Secondary | ICD-10-CM

## 2015-03-23 DIAGNOSIS — M25661 Stiffness of right knee, not elsewhere classified: Secondary | ICD-10-CM

## 2015-03-23 NOTE — Therapy (Signed)
[facility] [place], [place], [address] Phone: [phone]   Fax:  [phone]  Physical Therapy Treatment  Patient Details  Name: Willie Howe MRN: [id number] Date of Birth: 03/12/1961 Referring Provider:  Stephens Shire, MD  Encounter Date: 03/23/2015      PT End of Session - 03/23/15 0946    Visit Number 11   Number of Visits 18   Date for PT Re-Evaluation 04/20/15   PT Start Time 0859   PT Stop Time 0958   PT Time Calculation (min) 59 min   Activity Tolerance Patient tolerated treatment well   Behavior During Therapy [facility] for tasks assessed/performed      Past Medical History  Diagnosis Date  . Thyroid disease     Graves Disease  . Previous back surgery     x 3  . Dysrhythmia   . Anginal pain   . DVT (deep venous thrombosis)   . History of pulmonary embolism   . Graves disease 1996  . Kidney stone   . Arthritis     Past Surgical History  Procedure Laterality Date  . Knee surgery      x 2  . Nephrolithotomy    . Back surgery    . Colonoscopy    . Lumbar wound debridement N/A 02/23/2014    Procedure: LUMBAR WOUND DEBRIDEMENT;  Surgeon: Faythe Ghee, MD;  Location: [place];  Service: Neurosurgery;  Laterality: N/A;  exploration lumbar wound. repair of dural defect.  Marland Kitchen Spine surgery  2015    Dr Hal Neer Fusion   . Spine surgery  1999    discectomy  . Knee surgery Left 1976    ?ligament repair   . Menisectomy Right     open medial   . Knee arthroscopy Right 2006    meniscus   . Peripheral vascular catheterization N/A 01/30/2015    Procedure: IVC Filter Insertion;  Surgeon: Serafina Mitchell, MD;  Location: [facility];  Service: Cardiovascular;  Laterality: N/A;  . Ivc filter    . Total knee arthroplasty Right 02/02/2015    Procedure:  RIGHT TOTAL KNEE ARTHROPLASTY;  Surgeon: Carole Civil, MD;  Location: [facility];  Service: Orthopedics;  Laterality: Right;  LM with pt's daughter of new arrival  time (10:45)      There were no vitals filed for this visit.  Visit Diagnosis:  Right knee pain  Knee stiffness, right      Subjective Assessment - 03/23/15 0907    Subjective went to MD and he was pleased with progress thus far, happy with ROM and will see him back on Aug 18th to determine manipulation    Patient Stated Goals Reduce my right knee pain and move better.   Currently in Pain? Yes   Pain Score 4    Pain Location Knee   Pain Orientation Right   Pain Descriptors / Indicators Sore;Tender   Pain Type Surgical pain   Pain Frequency Constant   Aggravating Factors  ROM and prolong activity   Pain Relieving Factors rest            [facility] PT Assessment - 03/23/15 0001    AROM   Overall AROM  Within functional limits for tasks performed;Deficits   AROM Assessment Site Knee   Right/Left Knee Right   Right Knee Extension 0   Right Knee Flexion 85   PROM   Overall PROM  Deficits   Overall PROM Comments 95  PROM Assessment Site Knee   Right/Left Knee Right                     [facility] Adult PT Treatment/Exercise - 03/23/15 0001    Knee/Hip Exercises: Aerobic   Nustep L5 x15 min seat progression forward for ROM seat 15,14 try 13 next visit   Knee/Hip Exercises: Machines for Strengthening   Cybex Knee Extension 10# 3x10   Electrical Stimulation   Electrical Stimulation Location Right knee   Electrical Stimulation Action IFC   Electrical Stimulation Parameters 1-10hz    Electrical Stimulation Goals Pain   Vasopneumatic   Number Minutes Vasopneumatic  15 minutes   Vasopnuematic Location  Knee   Vasopneumatic Pressure Medium   Manual Therapy   Manual Therapy Passive ROM   Passive ROM Right knee flexion with low load holds, scar tissue massage, patella mobs                  PT Short Term Goals - 03/20/15 0907    PT SHORT TERM GOAL #1   Title Ind with advanced HEP.   Time 6   Period Weeks   Status On-going   PT SHORT TERM GOAL #2    Title Full active right knee extension in order to normalize gait.   Time 6   Status Achieved   PT SHORT TERM GOAL #3   Title Active knee flexion to 115 degrees+ so the patient can perform functional tasks and do so with pain not > 2-3/10.   Time 6   Period Weeks   Status On-going   PT SHORT TERM GOAL #4   Title Increase right  knee strength to a solid 4+/5 to provide good stability for accomplishment of functional activities   Time 6   Period Weeks   Status On-going   PT SHORT TERM GOAL #5   Title Perform a reciprocating stair gait with one railing with pain not > 3/10.   Time 6   Period Weeks   Status Achieved   PT SHORT TERM GOAL #6   Title Perform ADL's with pain not > 3/10.   Time 6   Period Weeks   Status On-going                  Plan - 03/23/15 0946    Clinical Impression Statement Patient continues to progress slowly with ROM in right knee. Patient has a lot of resistance with flexion stretching which may be due to scar tissue. Educated patient on continued self stretching to improve ROM and avoid having a manipulation. Patient going back to MD on aug 18th for follow up. Goals ongoing due to ROM limitations.   Pt will benefit from skilled therapeutic intervention in order to improve on the following deficits Abnormal gait;Decreased activity tolerance;Decreased range of motion;Decreased strength;Pain;Increased edema   Rehab Potential Good   Clinical Impairments Affecting Rehab Potential Surgery June 17 - current 5 weeks 03/09/15   PT Frequency 3x / week   PT Duration 6 weeks   PT Treatment/Interventions Cryotherapy;Electrical Stimulation;Therapeutic activities;Therapeutic exercise;Neuromuscular re-education;Patient/family education;Manual techniques;Scar mobilization;Passive range of motion   PT Next Visit Plan Cont with POC focus on knee flexion (MD 04/05/15)    Consulted and Agree with Plan of Care Patient        Problem List Patient Active Problem List    Diagnosis Date Noted  . Arthritis of knee, right 02/02/2015  . Primary osteoarthritis of right knee   . Pulmonary embolus 01/09/2015  .  UTI (lower urinary tract infection) 02/21/2014  . Other headache syndrome 02/21/2014  . Nausea with vomiting 02/21/2014  . Rheumatoid arthritis 02/21/2014  . Nausea & vomiting 02/21/2014  . Headache(784.0) 02/21/2014  . Lumbar spinal stenosis 02/10/2014    Benzion Mesta P, PTA 03/23/2015, 10:24 AM  [facility] [place], [place], [address] Phone: [phone]   Fax:  [phone]

## 2015-03-26 ENCOUNTER — Ambulatory Visit: Payer: 59 | Admitting: Orthopedic Surgery

## 2015-03-27 ENCOUNTER — Ambulatory Visit: Payer: 59 | Admitting: Physical Therapy

## 2015-03-27 ENCOUNTER — Encounter: Payer: Self-pay | Admitting: Physical Therapy

## 2015-03-27 DIAGNOSIS — M25661 Stiffness of right knee, not elsewhere classified: Secondary | ICD-10-CM

## 2015-03-27 DIAGNOSIS — M25561 Pain in right knee: Secondary | ICD-10-CM

## 2015-03-27 NOTE — Therapy (Signed)
[facility] [place], [place], [address] Phone: [phone]   Fax:  [phone]  Physical Therapy Treatment  Patient Details  Name: Willie Howe MRN: [id number] Date of Birth: 11-14-1960 Referring Provider:  Stephens Shire, MD  Encounter Date: 03/27/2015      PT End of Session - 03/27/15 0905    Visit Number 12   Number of Visits 18   Date for PT Re-Evaluation 04/20/15   PT Start Time 0901   PT Stop Time 0952   PT Time Calculation (min) 51 min      Past Medical History  Diagnosis Date  . Thyroid disease     Graves Disease  . Previous back surgery     x 3  . Dysrhythmia   . Anginal pain   . DVT (deep venous thrombosis)   . History of pulmonary embolism   . Graves disease 1996  . Kidney stone   . Arthritis     Past Surgical History  Procedure Laterality Date  . Knee surgery      x 2  . Nephrolithotomy    . Back surgery    . Colonoscopy    . Lumbar wound debridement N/A 02/23/2014    Procedure: LUMBAR WOUND DEBRIDEMENT;  Surgeon: Faythe Ghee, MD;  Location: [place];  Service: Neurosurgery;  Laterality: N/A;  exploration lumbar wound. repair of dural defect.  Marland Kitchen Spine surgery  2015    Dr Hal Neer Fusion   . Spine surgery  1999    discectomy  . Knee surgery Left 1976    ?ligament repair   . Menisectomy Right     open medial   . Knee arthroscopy Right 2006    meniscus   . Peripheral vascular catheterization N/A 01/30/2015    Procedure: IVC Filter Insertion;  Surgeon: Serafina Mitchell, MD;  Location: [place] [facility];  Service: Cardiovascular;  Laterality: N/A;  . Ivc filter    . Total knee arthroplasty Right 02/02/2015    Procedure:  RIGHT TOTAL KNEE ARTHROPLASTY;  Surgeon: Carole Civil, MD;  Location: [facility];  Service: Orthopedics;  Laterality: Right;  LM with pt's daughter of new arrival time (10:45)      There were no vitals filed for this visit.  Visit Diagnosis:  Right knee pain  Knee  stiffness, right      Subjective Assessment - 03/27/15 0904    Subjective Reports that he iced his knee before bed last night. Woke up with stiffness. Trying to avoid manipulation. 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   Patient Stated Goals Reduce my right knee pain and move better.   Currently in Pain? Yes   Pain Score 5    Pain Location Knee   Pain Orientation Right   Pain Descriptors / Indicators Other (Comment)  Stiffness   Pain Type Surgical pain            [facility] PT Assessment - 03/27/15 0001    Assessment   Medical Diagnosis Right total knee replacement.   Onset Date/Surgical Date 02/02/15   Next MD Visit 04/05/2015   ROM / Strength   AROM / PROM / Strength AROM   AROM   Overall AROM  Deficits   AROM Assessment Site Knee   Right/Left Knee Right   Right Knee Flexion 94  in sitting  [facility] Adult PT Treatment/Exercise - 03/27/15 0001    Knee/Hip Exercises: Aerobic   Stationary Bike x12 min, seat 10   Knee/Hip Exercises: Machines for Strengthening   Cybex Knee Extension 10# 3x10   Knee/Hip Exercises: Standing   Forward Lunges Right;Other (comment);20 reps;5 seconds  18" step   Rocker Board 3 minutes   Modalities   Modalities Designer, multimedia Location Right knee   Electrical Stimulation Action IFC   Electrical Stimulation Parameters 1-10 Hz x15 min   Electrical Stimulation Goals Pain   Vasopneumatic   Number Minutes Vasopneumatic  15 minutes   Vasopnuematic Location  Knee   Vasopneumatic Pressure Medium   Vasopneumatic Temperature  44                  PT Short Term Goals - 03/27/15 0935    PT SHORT TERM GOAL #1   Title Ind with advanced HEP.   Time 6   Period Weeks   Status Achieved   PT SHORT TERM GOAL #2   Title Full active right knee extension in order to normalize gait.   Time  6   Status Achieved   PT SHORT TERM GOAL #3   Title Active knee flexion to 115 degrees+ so the patient can perform functional tasks and do so with pain not > 2-3/10.   Time 6   Period Weeks   Status On-going   PT SHORT TERM GOAL #4   Title Increase right  knee strength to a solid 4+/5 to provide good stability for accomplishment of functional activities   Time 6   Period Weeks   Status On-going   PT SHORT TERM GOAL #5   Title Perform a reciprocating stair gait with one railing with pain not > 3/10.   Time 6   Period Weeks   Status Achieved   PT SHORT TERM GOAL #6   Title Perform ADL's with pain not > 3/10.   Time 6   Period Weeks   Status Achieved                  Plan - 03/27/15 0934    Clinical Impression Statement Patient tolerated treatment well today with focus on increasing R knee flexion. Unable to complete full revolutions on stationary bike. Goals remain on-going secondary to decreased ROM, strength. AROM in sitting of R knee was 94 deg today. Normal modaltiies response noted following removal of the modalities.    Pt will benefit from skilled therapeutic intervention in order to improve on the following deficits Abnormal gait;Decreased activity tolerance;Decreased range of motion;Decreased strength;Pain;Increased edema   Rehab Potential Good   Clinical Impairments Affecting Rehab Potential Surgery June/17/2016   PT Frequency 3x / week   PT Duration 6 weeks   PT Treatment/Interventions Cryotherapy;Electrical Stimulation;Therapeutic activities;Therapeutic exercise;Neuromuscular re-education;Patient/family education;Manual techniques;Scar mobilization;Passive range of motion   PT Next Visit Plan Cont with POC focus on knee flexion (MD 04/05/15)    Consulted and Agree with Plan of Care Patient        Problem List Patient Active Problem List   Diagnosis Date Noted  . Arthritis of knee, right 02/02/2015  . Primary osteoarthritis of right knee   . Pulmonary  embolus 01/09/2015  . UTI (lower urinary tract infection) 02/21/2014  . Other headache syndrome 02/21/2014  . Nausea with vomiting 02/21/2014  . Rheumatoid arthritis 02/21/2014  . Nausea & vomiting 02/21/2014  . Headache(784.0) 02/21/2014  . Lumbar spinal stenosis  02/10/2014    Wynelle Fanny, PTA 03/27/2015, 9:58 AM  [facility] [address] [place], [place], [address] Phone: [phone]   Fax:  [phone]

## 2015-03-30 ENCOUNTER — Ambulatory Visit: Payer: 59

## 2015-03-30 DIAGNOSIS — M25661 Stiffness of right knee, not elsewhere classified: Secondary | ICD-10-CM

## 2015-03-30 DIAGNOSIS — M25561 Pain in right knee: Secondary | ICD-10-CM

## 2015-03-30 NOTE — Therapy (Signed)
[facility] [place], [place], [address] Phone: [phone]   Fax:  [phone]  Physical Therapy Treatment  Patient Details  Name: Willie Howe MRN: [id number] Date of Birth: 1960/10/13 Referring Provider:  Stephens Shire, MD  Encounter Date: 03/30/2015    Past Medical History  Diagnosis Date  . Thyroid disease     Graves Disease  . Previous back surgery     x 3  . Dysrhythmia   . Anginal pain   . DVT (deep venous thrombosis)   . History of pulmonary embolism   . Graves disease 1996  . Kidney stone   . Arthritis     Past Surgical History  Procedure Laterality Date  . Knee surgery      x 2  . Nephrolithotomy    . Back surgery    . Colonoscopy    . Lumbar wound debridement N/A 02/23/2014    Procedure: LUMBAR WOUND DEBRIDEMENT;  Surgeon: Faythe Ghee, MD;  Location: [facility];  Service: Neurosurgery;  Laterality: N/A;  exploration lumbar wound. repair of dural defect.  Marland Kitchen Spine surgery  2015    Dr Hal Neer Fusion   . Spine surgery  1999    discectomy  . Knee surgery Left 1976    ?ligament repair   . Menisectomy Right     open medial   . Knee arthroscopy Right 2006    meniscus   . Peripheral vascular catheterization N/A 01/30/2015    Procedure: IVC Filter Insertion;  Surgeon: Serafina Mitchell, MD;  Location: [facility];  Service: Cardiovascular;  Laterality: N/A;  . Ivc filter    . Total knee arthroplasty Right 02/02/2015    Procedure:  RIGHT TOTAL KNEE ARTHROPLASTY;  Surgeon: Carole Civil, MD;  Location: [facility];  Service: Orthopedics;  Laterality: Right;  LM with pt's daughter of new arrival time (10:45)      There were no vitals filed for this visit.  Visit Diagnosis:  Right knee pain  Knee stiffness, right      Subjective Assessment - 03/30/15 0907    Subjective Pt. states he is experiencing some tension in his right knee; states most of the swelling is localized superior to the patella.   Limitations Standing;Walking   How long can you stand comfortably? 15 min   How long can you walk comfortably? 15 min   Patient Stated Goals Reduce my right knee pain and move better.   Currently in Pain? Yes   Pain Score 5    Pain Location Knee   Pain Orientation Right   Pain Descriptors / Indicators Throbbing;Tightness   Pain Type Surgical pain   Pain Onset More than a month ago   Pain Frequency Constant   Aggravating Factors  prolonged standing and ambulation   Pain Relieving Factors ice   Multiple Pain Sites No                         [facility] Adult PT Treatment/Exercise - 03/30/15 0001    Knee/Hip Exercises: Aerobic   Nustep L6 x15 min seat progression forward for ROM seat 15,14 try 13 next visit   Knee/Hip Exercises: Machines for Strengthening   Cybex Knee Extension 20# 3x12   Knee/Hip Exercises: Standing   Forward Lunges Right;Other (comment);5 seconds  25 Reps   Rocker Board 3 minutes   Modalities   Modalities Print production planner  Right knee   Electrical Stimulation Action IFC   Electrical Stimulation Parameters 1-10 Hz 15 min   Electrical Stimulation Goals Pain                  PT Short Term Goals - 03/27/15 0935    PT SHORT TERM GOAL #1   Title Ind with advanced HEP.   Time 6   Period Weeks   Status Achieved   PT SHORT TERM GOAL #2   Title Full active right knee extension in order to normalize gait.   Time 6   Status Achieved   PT SHORT TERM GOAL #3   Title Active knee flexion to 115 degrees+ so the patient can perform functional tasks and do so with pain not > 2-3/10.   Time 6   Period Weeks   Status On-going   PT SHORT TERM GOAL #4   Title Increase right  knee strength to a solid 4+/5 to provide good stability for accomplishment of functional activities   Time 6   Period Weeks   Status On-going   PT SHORT TERM GOAL #5   Title Perform a reciprocating  stair gait with one railing with pain not > 3/10.   Time 6   Period Weeks   Status Achieved   PT SHORT TERM GOAL #6   Title Perform ADL's with pain not > 3/10.   Time 6   Period Weeks   Status Achieved                  Plan - 03/30/15 1223    Clinical Impression Statement Pt. tolerated treatment well today; was able to achieve 90 degrees right knee flexion.   Pt will benefit from skilled therapeutic intervention in order to improve on the following deficits Abnormal gait;Decreased activity tolerance;Decreased range of motion;Decreased strength;Pain;Increased edema   Rehab Potential Good   Clinical Impairments Affecting Rehab Potential Surgery June/17/2016   PT Frequency 3x / week   PT Duration 6 weeks   PT Treatment/Interventions Cryotherapy;Electrical Stimulation;Therapeutic activities;Therapeutic exercise;Neuromuscular re-education;Patient/family education;Manual techniques;Scar mobilization;Passive range of motion   PT Next Visit Plan Cont with POC focus on knee flexion (MD 04/05/15)    Consulted and Agree with Plan of Care Patient        Problem List Patient Active Problem List   Diagnosis Date Noted  . Arthritis of knee, right 02/02/2015  . Primary osteoarthritis of right knee   . Pulmonary embolus 01/09/2015  . UTI (lower urinary tract infection) 02/21/2014  . Other headache syndrome 02/21/2014  . Nausea with vomiting 02/21/2014  . Rheumatoid arthritis 02/21/2014  . Nausea & vomiting 02/21/2014  . Headache(784.0) 02/21/2014  . Lumbar spinal stenosis 02/10/2014    Cherlyn Cushing PT 03/30/2015, 12:31 PM  [facility] [address] [place], [place], [address] Phone: [phone]   Fax:  [phone]

## 2015-04-02 ENCOUNTER — Telehealth: Payer: Self-pay | Admitting: Orthopedic Surgery

## 2015-04-02 ENCOUNTER — Other Ambulatory Visit: Payer: Self-pay | Admitting: Orthopedic Surgery

## 2015-04-02 ENCOUNTER — Other Ambulatory Visit: Payer: Self-pay | Admitting: *Deleted

## 2015-04-02 MED ORDER — HYDROCODONE-ACETAMINOPHEN 7.5-325 MG PO TABS: 1.0000 | ORAL_TABLET | ORAL | Status: DC | PRN

## 2015-04-02 MED ORDER — HYDROCODONE-ACETAMINOPHEN 10-325 MG PO TABS: 1.0000 | ORAL_TABLET | ORAL | Status: DC | PRN

## 2015-04-02 NOTE — Telephone Encounter (Signed)
Prescription available, called patient, no answer 

## 2015-04-02 NOTE — Telephone Encounter (Signed)
Patient is calling requesting a medication refill HYDROcodone-acetaminophen (NORCO) 10-325 MG per tablet please advise?

## 2015-04-03 ENCOUNTER — Ambulatory Visit: Payer: 59 | Admitting: Physical Therapy

## 2015-04-03 ENCOUNTER — Encounter: Payer: Self-pay | Admitting: Physical Therapy

## 2015-04-03 DIAGNOSIS — M25561 Pain in right knee: Secondary | ICD-10-CM | POA: Diagnosis not present

## 2015-04-03 DIAGNOSIS — M25661 Stiffness of right knee, not elsewhere classified: Secondary | ICD-10-CM

## 2015-04-03 NOTE — Therapy (Addendum)
[facility] [place], [place], [address] Phone: [phone]   Fax:  [phone]  Physical Therapy Treatment  Patient Details  Name: Willie Howe MRN: [id number] Date of Birth: Apr 12, 1961 Referring Provider:  Stephens Shire, MD  Encounter Date: 04/03/2015    Past Medical History  Diagnosis Date  . Thyroid disease     Graves Disease  . Previous back surgery     x 3  . Dysrhythmia   . Anginal pain   . DVT (deep venous thrombosis)   . History of pulmonary embolism   . Graves disease 1996  . Kidney stone   . Arthritis     Past Surgical History  Procedure Laterality Date  . Knee surgery      x 2  . Nephrolithotomy    . Back surgery    . Colonoscopy    . Lumbar wound debridement N/A 02/23/2014    Procedure: LUMBAR WOUND DEBRIDEMENT;  Surgeon: Faythe Ghee, MD;  Location: [facility];  Service: Neurosurgery;  Laterality: N/A;  exploration lumbar wound. repair of dural defect.  Marland Kitchen Spine surgery  2015    Dr Hal Neer Fusion   . Spine surgery  1999    discectomy  . Knee surgery Left 1976    ?ligament repair   . Menisectomy Right     open medial   . Knee arthroscopy Right 2006    meniscus   . Peripheral vascular catheterization N/A 01/30/2015    Procedure: IVC Filter Insertion;  Surgeon: Serafina Mitchell, MD;  Location: [facility];  Service: Cardiovascular;  Laterality: N/A;  . Ivc filter    . Total knee arthroplasty Right 02/02/2015    Procedure:  RIGHT TOTAL KNEE ARTHROPLASTY;  Surgeon: Carole Civil, MD;  Location: [facility];  Service: Orthopedics;  Laterality: Right;  LM with pt's daughter of new arrival time (10:45)      There were no vitals filed for this visit.  Visit Diagnosis:  Right knee pain  Knee stiffness, right                                 PT Short Term Goals - 04/03/15 1113    PT SHORT TERM GOAL #1   Title Ind with advanced HEP.   Time 6   Period Weeks   Status  Achieved   PT SHORT TERM GOAL #2   Title Full active right knee extension in order to normalize gait.   Time 6   Period Weeks   Status Achieved   PT SHORT TERM GOAL #3   Title Active knee flexion to 115 degrees+ so the patient can perform functional tasks and do so with pain not > 2-3/10.   Time 6   Period Weeks   Status On-going   PT SHORT TERM GOAL #4   Title Increase right  knee strength to a solid 4+/5 to provide good stability for accomplishment of functional activities   Time 6   Period Weeks   Status On-going   PT SHORT TERM GOAL #5   Title Perform a reciprocating stair gait with one railing with pain not > 3/10.   Time 6   Period Weeks   Status Achieved   PT SHORT TERM GOAL #6   Title Perform ADL's with pain not > 3/10.   Time 6   Period Weeks   Status Achieved  Problem List Patient Active Problem List   Diagnosis Date Noted  . Arthritis of knee, right 02/02/2015  . Primary osteoarthritis of right knee   . Pulmonary embolus 01/09/2015  . UTI (lower urinary tract infection) 02/21/2014  . Other headache syndrome 02/21/2014  . Nausea with vomiting 02/21/2014  . Rheumatoid arthritis 02/21/2014  . Nausea & vomiting 02/21/2014  . Headache(784.0) 02/21/2014  . Lumbar spinal stenosis 02/10/2014   PHYSICAL THERAPY DISCHARGE SUMMARY  Visits from Start of Care:   Current functional level related to goals / functional outcomes: Please see above.   Remaining deficits: Loss of right knee ROM and continued pain.   Education / Equipment: HEP. Plan: Patient agrees to discharge.  Patient goals were not met. Patient is being discharged due to the physician's request.  ?????      [place], [place] MPT 04/05/2015, 11:53 AM  Mali Applegate MPT [facility] [place], [place], [address] Phone: [phone]   Fax:  [phone]

## 2015-04-05 ENCOUNTER — Encounter: Payer: Self-pay | Admitting: Orthopedic Surgery

## 2015-04-05 ENCOUNTER — Ambulatory Visit (INDEPENDENT_AMBULATORY_CARE_PROVIDER_SITE_OTHER): Payer: 59 | Admitting: Orthopedic Surgery

## 2015-04-05 VITALS — BP 134/88 | Ht 75.0 in | Wt 278.0 lb

## 2015-04-05 DIAGNOSIS — Z9660 Presence of unspecified orthopedic joint implant: Secondary | ICD-10-CM

## 2015-04-05 DIAGNOSIS — Z96698 Presence of other orthopedic joint implants: Principal | ICD-10-CM

## 2015-04-05 DIAGNOSIS — Z471 Aftercare following joint replacement surgery: Secondary | ICD-10-CM

## 2015-04-05 DIAGNOSIS — Z96651 Presence of right artificial knee joint: Secondary | ICD-10-CM

## 2015-04-05 MED ORDER — HYDROCODONE-ACETAMINOPHEN 7.5-325 MG PO TABS: 1.0000 | ORAL_TABLET | ORAL | Status: DC | PRN

## 2015-04-05 NOTE — Progress Notes (Signed)
Chief Complaint  Patient presents with  . Follow-up     follow up right total knee replacement, DOS 02/01/15   The patient has approximately 97 of range in the therapist's office but here is about 90 at thinking get better and that we should go to get him 220 with manipulation and then maintain 105-110 degrees he is acceptable to this and we will arrange this at earliest convenience. We will set it up as a outpatient surgery and then do an observation for pain control and then discharged the next day

## 2015-04-06 ENCOUNTER — Telehealth: Payer: Self-pay | Admitting: Orthopedic Surgery

## 2015-04-06 NOTE — Addendum Note (Signed)
Addended by: Baldomero Lamy B on: 04/06/2015 10:48 AM   Modules accepted: Orders

## 2015-04-06 NOTE — Telephone Encounter (Signed)
Regarding (ambulatory) surgery scheduled at [facility] 04/18/15, CPT 27570, ICD-10 Z47.1, Z96.60, contacted [facility], [phone]; no pre-authorization required for out-patient basis for this procedure, per Janus Molder, Call Reference# [id number].

## 2015-04-12 ENCOUNTER — Other Ambulatory Visit: Payer: Self-pay

## 2015-04-12 ENCOUNTER — Telehealth: Payer: Self-pay | Admitting: *Deleted

## 2015-04-12 NOTE — Telephone Encounter (Signed)
Vascular and Vein office called to advised patient will have filter removed 04/24/15. Is requesting you put note in chart if you approve this.

## 2015-04-13 NOTE — Patient Instructions (Addendum)
   Your procedure is scheduled on: 04/18/2015  Report to [facility] at   12:00  AM.  Call this number if you have problems the morning of surgery: [phone]   Remember:   Do not drink or eat food:After Midnight.  :  Take these medicines the morning of surgery with A SIP OF WATER: Predisone   Do not wear jewelry, make-up or nail polish.  Do not wear lotions, powders, or perfumes. You may wear deodorant.  Do not shave 48 hours prior to surgery. Men may shave face and neck.  Do not bring valuables to the hospital.  Contacts, dentures or bridgework may not be worn into surgery.  Leave suitcase in the car. After surgery it may be brought to your room.  For patients admitted to the hospital, checkout time is 11:00 AM the day of discharge.   Patients discharged the day of surgery will not be allowed to drive home.    Special Instructions: Shower using CHG night before surgery and shower the day of surgery use CHG.  Use special wash - you have one bottle of CHG for all showers.  You should use approximately 1/2 of the bottle for each shower.   Please read over the following fact sheets that you were given: Pain Booklet, MRSA Information, Surgical Site Infection Prevention and Care and Recovery After Surgery  General Anesthesia, Care After Refer to this sheet in the next few weeks. These instructions provide you with information on caring for yourself after your procedure. Your health care provider may also give you more specific instructions. Your treatment has been planned according to current medical practices, but problems sometimes occur. Call your health care provider if you have any problems or questions after your procedure. WHAT TO EXPECT AFTER THE PROCEDURE After the procedure, it is typical to experience:  Sleepiness.  Nausea and vomiting. HOME CARE INSTRUCTIONS  For the first 24 hours after general anesthesia:  Have a responsible person with you.  Do not drive a car. If you  are alone, do not take public transportation.  Do not drink alcohol.  Do not take medicine that has not been prescribed by your health care provider.  Do not sign important papers or make important decisions.  You may resume a normal diet and activities as directed by your health care provider.  Change bandages (dressings) as directed.  If you have questions or problems that seem related to general anesthesia, call the hospital and ask for the anesthetist or anesthesiologist on call. SEEK MEDICAL CARE IF:  You have nausea and vomiting that continue the day after anesthesia.  You develop a rash. SEEK IMMEDIATE MEDICAL CARE IF:   You have difficulty breathing.  You have chest pain.  You have any allergic problems. Document Released: 11/10/2000 Document Revised: 08/09/2013 Document Reviewed: 02/17/2013 [facility] Patient Information 2015 [place], [place]. This information is not intended to replace advice given to you by your health care provider. Make sure you discuss any questions you have with your health care provider.

## 2015-04-16 ENCOUNTER — Encounter (HOSPITAL_COMMUNITY)
Admission: RE | Admit: 2015-04-16 | Discharge: 2015-04-16 | Disposition: A | Discharge status: Home / Self Care | Payer: 59 | Source: Ambulatory Visit | Attending: Orthopedic Surgery | Admitting: Orthopedic Surgery

## 2015-04-16 ENCOUNTER — Encounter (HOSPITAL_COMMUNITY): Payer: Self-pay

## 2015-04-16 ENCOUNTER — Encounter: Payer: Self-pay | Admitting: Orthopedic Surgery

## 2015-04-16 DIAGNOSIS — Z87442 Personal history of urinary calculi: Secondary | ICD-10-CM | POA: Diagnosis not present

## 2015-04-16 DIAGNOSIS — M24661 Ankylosis, right knee: Secondary | ICD-10-CM | POA: Diagnosis not present

## 2015-04-16 DIAGNOSIS — M069 Rheumatoid arthritis, unspecified: Secondary | ICD-10-CM | POA: Diagnosis not present

## 2015-04-16 DIAGNOSIS — Z96651 Presence of right artificial knee joint: Secondary | ICD-10-CM | POA: Diagnosis not present

## 2015-04-16 DIAGNOSIS — Z86718 Personal history of other venous thrombosis and embolism: Secondary | ICD-10-CM | POA: Diagnosis not present

## 2015-04-16 DIAGNOSIS — E0500 Thyrotoxicosis with diffuse goiter without thyrotoxic crisis or storm: Secondary | ICD-10-CM | POA: Diagnosis not present

## 2015-04-16 DIAGNOSIS — R29898 Other symptoms and signs involving the musculoskeletal system: Secondary | ICD-10-CM | POA: Diagnosis not present

## 2015-04-16 DIAGNOSIS — M25661 Stiffness of right knee, not elsewhere classified: Secondary | ICD-10-CM | POA: Diagnosis present

## 2015-04-16 DIAGNOSIS — Z86711 Personal history of pulmonary embolism: Secondary | ICD-10-CM | POA: Diagnosis not present

## 2015-04-16 DIAGNOSIS — Z87891 Personal history of nicotine dependence: Secondary | ICD-10-CM | POA: Diagnosis not present

## 2015-04-16 HISTORY — DX: Personal history of urinary calculi: Z87.442

## 2015-04-16 LAB — BASIC METABOLIC PANEL
Anion gap: 5 (ref 5–15)
BUN: 8 mg/dL (ref 6–20)
CALCIUM: 9.2 mg/dL (ref 8.9–10.3)
CO2: 26 mmol/L (ref 22–32)
CREATININE: 0.82 mg/dL (ref 0.61–1.24)
Chloride: 106 mmol/L (ref 101–111)
GFR calc non Af Amer: >60 mL/min (ref >60)
Glucose, Bld: 116 mg/dL — ABNORMAL HIGH (ref 65–99)
Potassium: 4.1 mmol/L (ref 3.5–5.1)
Sodium: 137 mmol/L (ref 135–145)

## 2015-04-16 LAB — CBC WITH DIFFERENTIAL/PLATELET
BASOS PCT: 0 % (ref 0–1)
Basophils Absolute: 0 10*3/uL (ref 0.0–0.1)
EOS ABS: 0.1 10*3/uL (ref 0.0–0.7)
Eosinophils Relative: 1 % (ref 0–5)
HEMATOCRIT: 41 % (ref 39.0–52.0)
Hemoglobin: 13.1 g/dL (ref 13.0–17.0)
LYMPHS ABS: 1.9 10*3/uL (ref 0.7–4.0)
Lymphocytes Relative: 20 % (ref 12–46)
MCH: 28.3 pg (ref 26.0–34.0)
MCHC: 32 g/dL (ref 30.0–36.0)
MCV: 88.6 fL (ref 78.0–100.0)
MONO ABS: 0.6 10*3/uL (ref 0.1–1.0)
MONOS PCT: 6 % (ref 3–12)
NEUTROS ABS: 6.9 10*3/uL (ref 1.7–7.7)
Neutrophils Relative %: 73 % (ref 43–77)
Platelets: 321 10*3/uL (ref 150–400)
RBC: 4.63 MIL/uL (ref 4.22–5.81)
RDW: 14.9 % (ref 11.5–15.5)
WBC: 9.4 10*3/uL (ref 4.0–10.5)

## 2015-04-16 NOTE — Progress Notes (Signed)
The patient will have his filter removed which was placed prior to his total knee replacement. He is more than 6 weeks out from his knee replacement and the filter can be removed from an orthopedic standpoint.

## 2015-04-17 NOTE — H&P (Signed)
Willie Howe is an 54 y.o. male.   Chief Complaint: Stiffness right knee HPI: 54 year old male had total knee replacement 8 weeks ago. He did fairly well but his flexion is plateaued at approximately 90. We were able to get 125 on the table so we expect him to get at least 110. Based on his inability to aggress any further he opted for attempt at manipulation to improve his flexion.  Review of systems other than his back pain nothing to add  Past Medical History  Diagnosis Date  . Thyroid disease     Graves Disease  . Previous back surgery     x 3  . Dysrhythmia   . Anginal pain   . DVT (deep venous thrombosis)   . History of pulmonary embolism   . Graves disease 1996  . Arthritis   . History of kidney stones     Past Surgical History  Procedure Laterality Date  . Knee surgery      x 2  . Nephrolithotomy    . Back surgery    . Colonoscopy    . Lumbar wound debridement N/A 02/23/2014    Procedure: LUMBAR WOUND DEBRIDEMENT;  Surgeon: Faythe Ghee, MD;  Location: [facility];  Service: Neurosurgery;  Laterality: N/A;  exploration lumbar wound. repair of dural defect.  [facility] Spine surgery  2015    Dr Hal Neer Fusion   . Spine surgery  1999    discectomy  . Knee surgery Left 1976    ?ligament repair   . Menisectomy Right     open medial   . Knee arthroscopy Right 2006    meniscus   . Peripheral vascular catheterization N/A 01/30/2015    Procedure: IVC Filter Insertion;  Surgeon: Serafina Mitchell, MD;  Location: [facility];  Service: Cardiovascular;  Laterality: N/A;  . Ivc filter    . Total knee arthroplasty Right 02/02/2015    Procedure:  RIGHT TOTAL KNEE ARTHROPLASTY;  Surgeon: Carole Civil, MD;  Location: [facility];  Service: Orthopedics;  Laterality: Right;  LM with pt's daughter of new arrival time (10:45)      Family History  Problem Relation Age of Onset  . CAD Sister   . Deep vein thrombosis Mother   . Hypertension Mother   . Hypertension Father    Social  History:  reports that he quit smoking about 20 years ago. His smoking use included Cigarettes. He has a 30 pack-year smoking history. He has never used smokeless tobacco. He reports that he does not drink alcohol or use illicit drugs.  Allergies: No Known Allergies  No prescriptions prior to admission    Results for orders placed or performed during the hospital encounter of 04/16/15 (from the past 48 hour(s))  CBC with Differential/Platelet     Status: None   Collection Time: 04/16/15 11:00 AM  Result Value Ref Range   WBC 9.4 4.0 - 10.5 K/uL   RBC 4.63 4.22 - 5.81 MIL/uL   Hemoglobin 13.1 13.0 - 17.0 g/dL   HCT 41.0 39.0 - 52.0 %   MCV 88.6 78.0 - 100.0 fL   MCH 28.3 26.0 - 34.0 pg   MCHC 32.0 30.0 - 36.0 g/dL   RDW 14.9 11.5 - 15.5 %   Platelets 321 150 - 400 K/uL   Neutrophils Relative % 73 43 - 77 %   Neutro Abs 6.9 1.7 - 7.7 K/uL   Lymphocytes Relative 20 12 - 46 %   Lymphs  Abs 1.9 0.7 - 4.0 K/uL   Monocytes Relative 6 3 - 12 %   Monocytes Absolute 0.6 0.1 - 1.0 K/uL   Eosinophils Relative 1 0 - 5 %   Eosinophils Absolute 0.1 0.0 - 0.7 K/uL   Basophils Relative 0 0 - 1 %   Basophils Absolute 0.0 0.0 - 0.1 K/uL  Basic metabolic panel     Status: Abnormal   Collection Time: 04/16/15 11:00 AM  Result Value Ref Range   Sodium 137 135 - 145 mmol/L   Potassium 4.1 3.5 - 5.1 mmol/L   Chloride 106 101 - 111 mmol/L   CO2 26 22 - 32 mmol/L   Glucose, Bld 116 (H) 65 - 99 mg/dL   BUN 8 6 - 20 mg/dL   Creatinine, Ser 0.82 0.61 - 1.24 mg/dL   Calcium 9.2 8.9 - 10.3 mg/dL   GFR calc non Af Amer >60 >60 mL/min   GFR calc Af Amer >60 >60 mL/min    Comment: (NOTE) The eGFR has been calculated using the CKD EPI equation. This calculation has not been validated in all clinical situations. eGFR's persistently <60 mL/min signify possible Chronic Kidney Disease.    Anion gap 5 5 - 15   No results found.  ROS  There were no vitals taken for this visit. Physical Exam normal  grooming and hygiene  Oriented 3  Mood affect normal  Amer tray with assistive device at times slight limp.  Right knee incision healed well he has some warmth and swelling to the joint. Range of motion limited to 90 has a slight flexion contracture less than 5 knee is stable motor exam is normal neurovascular exam is intact  Assessment/Plan Arthrofibrosis right knee after total knee replacement  Recommend manipulation under anesthesia right knee  Risk and benefits of procedure explained including risk of fracture.    Arther Abbott 04/17/2015, 12:04 PM

## 2015-04-18 ENCOUNTER — Ambulatory Visit (HOSPITAL_COMMUNITY)
Admission: RE | Admit: 2015-04-18 | Discharge: 2015-04-19 | Disposition: A | Discharge status: Home / Self Care | Payer: 59 | Source: Ambulatory Visit | Attending: Orthopedic Surgery | Admitting: Orthopedic Surgery

## 2015-04-18 ENCOUNTER — Encounter (HOSPITAL_COMMUNITY)
Admission: RE | Disposition: A | Discharge status: Home / Self Care | Payer: Self-pay | Source: Ambulatory Visit | Attending: Orthopedic Surgery

## 2015-04-18 ENCOUNTER — Encounter (HOSPITAL_COMMUNITY): Payer: Self-pay | Admitting: *Deleted

## 2015-04-18 ENCOUNTER — Ambulatory Visit (HOSPITAL_COMMUNITY): Payer: 59 | Admitting: Anesthesiology

## 2015-04-18 DIAGNOSIS — Z96651 Presence of right artificial knee joint: Secondary | ICD-10-CM | POA: Insufficient documentation

## 2015-04-18 DIAGNOSIS — Z87891 Personal history of nicotine dependence: Secondary | ICD-10-CM | POA: Insufficient documentation

## 2015-04-18 DIAGNOSIS — M24661 Ankylosis, right knee: Secondary | ICD-10-CM | POA: Diagnosis not present

## 2015-04-18 DIAGNOSIS — Z86711 Personal history of pulmonary embolism: Secondary | ICD-10-CM | POA: Insufficient documentation

## 2015-04-18 DIAGNOSIS — Z87442 Personal history of urinary calculi: Secondary | ICD-10-CM | POA: Insufficient documentation

## 2015-04-18 DIAGNOSIS — R29898 Other symptoms and signs involving the musculoskeletal system: Secondary | ICD-10-CM | POA: Insufficient documentation

## 2015-04-18 DIAGNOSIS — T8482XA Fibrosis due to internal orthopedic prosthetic devices, implants and grafts, initial encounter: Secondary | ICD-10-CM | POA: Insufficient documentation

## 2015-04-18 DIAGNOSIS — Z86718 Personal history of other venous thrombosis and embolism: Secondary | ICD-10-CM | POA: Insufficient documentation

## 2015-04-18 DIAGNOSIS — M069 Rheumatoid arthritis, unspecified: Secondary | ICD-10-CM | POA: Insufficient documentation

## 2015-04-18 DIAGNOSIS — E0500 Thyrotoxicosis with diffuse goiter without thyrotoxic crisis or storm: Secondary | ICD-10-CM | POA: Insufficient documentation

## 2015-04-18 HISTORY — PX: EXAM UNDER ANESTHESIA WITH MANIPULATION OF KNEE: SHX5816

## 2015-04-18 HISTORY — PX: KNEE JOINT MANIPULATION: SHX386

## 2015-04-18 LAB — CBC
HEMATOCRIT: 40.5 % (ref 39.0–52.0)
HEMOGLOBIN: 13 g/dL (ref 13.0–17.0)
MCH: 28.8 pg (ref 26.0–34.0)
MCHC: 32.1 g/dL (ref 30.0–36.0)
MCV: 89.6 fL (ref 78.0–100.0)
Platelets: 289 10*3/uL (ref 150–400)
RBC: 4.52 MIL/uL (ref 4.22–5.81)
RDW: 15.1 % (ref 11.5–15.5)
WBC: 10.6 10*3/uL — ABNORMAL HIGH (ref 4.0–10.5)

## 2015-04-18 LAB — CREATININE, SERUM
Creatinine, Ser: 0.79 mg/dL (ref 0.61–1.24)
GFR calc Af Amer: >60 mL/min (ref >60)

## 2015-04-18 SURGERY — MANIPULATION, JOINT, KNEE, WITH ANESTHESIA
Anesthesia: General | Laterality: Right

## 2015-04-18 MED ORDER — ASPIRIN EC 325 MG PO TBEC
325.0000 mg | DELAYED_RELEASE_TABLET | Freq: Every day | ORAL | Status: DC
  Administered 2015-04-19: 325 mg via ORAL
  Filled 2015-04-18: qty 1

## 2015-04-18 MED ORDER — HYDROCODONE-ACETAMINOPHEN 7.5-325 MG PO TABS
1.0000 | ORAL_TABLET | ORAL | Status: DC
  Administered 2015-04-18 – 2015-04-19 (×5): 1 via ORAL
  Filled 2015-04-18 (×5): qty 1

## 2015-04-18 MED ORDER — ALUM & MAG HYDROXIDE-SIMETH 200-200-20 MG/5ML PO SUSP: 30.0000 mL | ORAL | Status: DC | PRN

## 2015-04-18 MED ORDER — ONDANSETRON HCL 4 MG/2ML IJ SOLN
4.0000 mg | Freq: Once | INTRAMUSCULAR | Status: AC
  Administered 2015-04-18: 4 mg via INTRAVENOUS
  Filled 2015-04-18: qty 2

## 2015-04-18 MED ORDER — FENTANYL CITRATE (PF) 100 MCG/2ML IJ SOLN
INTRAMUSCULAR | Status: DC | PRN
  Administered 2015-04-18: 50 ug via INTRAVENOUS

## 2015-04-18 MED ORDER — CEFAZOLIN SODIUM-DEXTROSE 2-3 GM-% IV SOLR
INTRAVENOUS | Status: AC
  Filled 2015-04-18: qty 50

## 2015-04-18 MED ORDER — PHENOL 1.4 % MT LIQD: 1.0000 | OROMUCOSAL | Status: DC | PRN

## 2015-04-18 MED ORDER — METHOCARBAMOL 500 MG PO TABS: 500.0000 mg | ORAL_TABLET | Freq: Four times a day (QID) | ORAL | Status: DC | PRN

## 2015-04-18 MED ORDER — SENNA 8.6 MG PO TABS
1.0000 | ORAL_TABLET | Freq: Two times a day (BID) | ORAL | Status: DC
  Administered 2015-04-18 – 2015-04-19 (×3): 8.6 mg via ORAL
  Filled 2015-04-18 (×3): qty 1

## 2015-04-18 MED ORDER — OXYCODONE HCL 5 MG PO TABS
5.0000 mg | ORAL_TABLET | Freq: Once | ORAL | Status: AC
  Administered 2015-04-18: 5 mg via ORAL
  Filled 2015-04-18: qty 1

## 2015-04-18 MED ORDER — DIPHENHYDRAMINE HCL 12.5 MG/5ML PO ELIX: 12.5000 mg | ORAL_SOLUTION | ORAL | Status: DC | PRN

## 2015-04-18 MED ORDER — METOCLOPRAMIDE HCL 10 MG PO TABS: 5.0000 mg | ORAL_TABLET | Freq: Three times a day (TID) | ORAL | Status: DC | PRN

## 2015-04-18 MED ORDER — FENTANYL CITRATE (PF) 100 MCG/2ML IJ SOLN
INTRAMUSCULAR | Status: AC
  Filled 2015-04-18: qty 4

## 2015-04-18 MED ORDER — PREGABALIN 50 MG PO CAPS
50.0000 mg | ORAL_CAPSULE | Freq: Once | ORAL | Status: AC
  Administered 2015-04-18: 50 mg via ORAL
  Filled 2015-04-18: qty 1

## 2015-04-18 MED ORDER — PROPOFOL 10 MG/ML IV BOLUS
INTRAVENOUS | Status: DC | PRN
  Administered 2015-04-18: 150 mg via INTRAVENOUS

## 2015-04-18 MED ORDER — METHOTREXATE 2.5 MG PO TABS
20.0000 mg | ORAL_TABLET | ORAL | Status: DC
  Filled 2015-04-18: qty 8

## 2015-04-18 MED ORDER — FENTANYL CITRATE (PF) 100 MCG/2ML IJ SOLN
25.0000 ug | INTRAMUSCULAR | Status: DC | PRN
  Administered 2015-04-18 (×4): 50 ug via INTRAVENOUS
  Filled 2015-04-18 (×2): qty 2

## 2015-04-18 MED ORDER — CHLORHEXIDINE GLUCONATE 4 % EX LIQD: 60.0000 mL | Freq: Once | CUTANEOUS | Status: DC

## 2015-04-18 MED ORDER — ACETAMINOPHEN 325 MG PO TABS: 650.0000 mg | ORAL_TABLET | Freq: Four times a day (QID) | ORAL | Status: DC | PRN

## 2015-04-18 MED ORDER — ONDANSETRON HCL 4 MG/2ML IJ SOLN: 4.0000 mg | Freq: Once | INTRAMUSCULAR | Status: DC | PRN

## 2015-04-18 MED ORDER — FENTANYL CITRATE (PF) 100 MCG/2ML IJ SOLN
25.0000 ug | INTRAMUSCULAR | Status: AC
  Administered 2015-04-18 (×2): 25 ug via INTRAVENOUS

## 2015-04-18 MED ORDER — ONDANSETRON HCL 4 MG/2ML IJ SOLN
4.0000 mg | Freq: Once | INTRAMUSCULAR | Status: AC
  Administered 2015-04-18: 4 mg via INTRAVENOUS

## 2015-04-18 MED ORDER — HYDROMORPHONE HCL 1 MG/ML IJ SOLN: 0.5000 mg | INTRAMUSCULAR | Status: DC | PRN

## 2015-04-18 MED ORDER — MIDAZOLAM HCL 5 MG/5ML IJ SOLN
INTRAMUSCULAR | Status: DC | PRN
  Administered 2015-04-18: 2 mg via INTRAVENOUS

## 2015-04-18 MED ORDER — DEXAMETHASONE SODIUM PHOSPHATE 4 MG/ML IJ SOLN
10.0000 mg | Freq: Once | INTRAMUSCULAR | Status: AC
  Administered 2015-04-19: 10 mg via INTRAVENOUS
  Filled 2015-04-18: qty 3

## 2015-04-18 MED ORDER — ONDANSETRON HCL 4 MG PO TABS
4.0000 mg | ORAL_TABLET | Freq: Four times a day (QID) | ORAL | Status: DC | PRN
Start: 2015-04-18 — End: 2015-04-19

## 2015-04-18 MED ORDER — METHOCARBAMOL 1000 MG/10ML IJ SOLN
500.0000 mg | Freq: Once | INTRAVENOUS | Status: AC
  Administered 2015-04-18: 500 mg via INTRAVENOUS
  Filled 2015-04-18: qty 5

## 2015-04-18 MED ORDER — CEFAZOLIN SODIUM 1-5 GM-% IV SOLN: 1.0000 g | Freq: Once | INTRAVENOUS | Status: DC

## 2015-04-18 MED ORDER — DOCUSATE SODIUM 100 MG PO CAPS
100.0000 mg | ORAL_CAPSULE | Freq: Two times a day (BID) | ORAL | Status: DC
  Administered 2015-04-18 – 2015-04-19 (×3): 100 mg via ORAL
  Filled 2015-04-18 (×3): qty 1

## 2015-04-18 MED ORDER — MAGNESIUM CITRATE PO SOLN: 1.0000 | Freq: Once | ORAL | Status: DC | PRN

## 2015-04-18 MED ORDER — FOLIC ACID 1 MG PO TABS
1.0000 mg | ORAL_TABLET | Freq: Every day | ORAL | Status: DC
  Administered 2015-04-18 – 2015-04-19 (×2): 1 mg via ORAL
  Filled 2015-04-18 (×2): qty 1

## 2015-04-18 MED ORDER — CEFAZOLIN SODIUM-DEXTROSE 2-3 GM-% IV SOLR
2.0000 g | INTRAVENOUS | Status: AC
  Administered 2015-04-18: 3 g via INTRAVENOUS

## 2015-04-18 MED ORDER — PROPOFOL 10 MG/ML IV BOLUS
INTRAVENOUS | Status: AC
  Filled 2015-04-18: qty 20

## 2015-04-18 MED ORDER — PREDNISONE 10 MG PO TABS: 5.0000 mg | ORAL_TABLET | Freq: Every day | ORAL | Status: DC

## 2015-04-18 MED ORDER — SORBITOL 70 % SOLN: 30.0000 mL | Freq: Every day | Status: DC | PRN

## 2015-04-18 MED ORDER — METHOCARBAMOL 1000 MG/10ML IJ SOLN
500.0000 mg | Freq: Four times a day (QID) | INTRAVENOUS | Status: DC | PRN
  Filled 2015-04-18: qty 5

## 2015-04-18 MED ORDER — CELECOXIB 400 MG PO CAPS
400.0000 mg | ORAL_CAPSULE | Freq: Once | ORAL | Status: AC
  Administered 2015-04-18: 400 mg via ORAL
  Filled 2015-04-18: qty 1

## 2015-04-18 MED ORDER — LIDOCAINE HCL 1 % IJ SOLN
INTRAMUSCULAR | Status: DC | PRN
  Administered 2015-04-18: 30 mg via INTRADERMAL

## 2015-04-18 MED ORDER — SODIUM CHLORIDE 0.9 % IV SOLN
INTRAVENOUS | Status: DC
  Administered 2015-04-18 – 2015-04-19 (×3): via INTRAVENOUS

## 2015-04-18 MED ORDER — ACETAMINOPHEN 650 MG RE SUPP: 650.0000 mg | Freq: Four times a day (QID) | RECTAL | Status: DC | PRN

## 2015-04-18 MED ORDER — DEXTROSE 5 % IV SOLN: 3.0000 g | INTRAVENOUS | Status: DC

## 2015-04-18 MED ORDER — POLYETHYLENE GLYCOL 3350 17 G PO PACK: 17.0000 g | PACK | Freq: Every day | ORAL | Status: DC | PRN

## 2015-04-18 MED ORDER — MIDAZOLAM HCL 2 MG/2ML IJ SOLN
INTRAMUSCULAR | Status: AC
  Filled 2015-04-18: qty 2

## 2015-04-18 MED ORDER — ENOXAPARIN SODIUM 30 MG/0.3ML ~~LOC~~ SOLN
30.0000 mg | SUBCUTANEOUS | Status: DC
  Administered 2015-04-19: 30 mg via SUBCUTANEOUS
  Filled 2015-04-18: qty 0.3

## 2015-04-18 MED ORDER — OXYCODONE HCL 5 MG PO TABS
5.0000 mg | ORAL_TABLET | ORAL | Status: DC | PRN
  Administered 2015-04-18 – 2015-04-19 (×2): 10 mg via ORAL
  Filled 2015-04-18 (×2): qty 2

## 2015-04-18 MED ORDER — ONDANSETRON HCL 4 MG/2ML IJ SOLN: 4.0000 mg | Freq: Four times a day (QID) | INTRAMUSCULAR | Status: DC | PRN

## 2015-04-18 MED ORDER — MIDAZOLAM HCL 2 MG/2ML IJ SOLN
1.0000 mg | INTRAMUSCULAR | Status: DC | PRN
  Administered 2015-04-18: 2 mg via INTRAVENOUS

## 2015-04-18 MED ORDER — FENTANYL CITRATE (PF) 100 MCG/2ML IJ SOLN
INTRAMUSCULAR | Status: AC
  Filled 2015-04-18: qty 2

## 2015-04-18 MED ORDER — KETOROLAC TROMETHAMINE 15 MG/ML IJ SOLN
15.0000 mg | Freq: Four times a day (QID) | INTRAMUSCULAR | Status: AC
  Administered 2015-04-18 – 2015-04-19 (×4): 15 mg via INTRAVENOUS
  Filled 2015-04-18 (×4): qty 1

## 2015-04-18 MED ORDER — CEFAZOLIN SODIUM 1-5 GM-% IV SOLN
INTRAVENOUS | Status: AC
  Filled 2015-04-18: qty 50

## 2015-04-18 MED ORDER — ONDANSETRON HCL 4 MG/2ML IJ SOLN
INTRAMUSCULAR | Status: AC
  Filled 2015-04-18: qty 2

## 2015-04-18 MED ORDER — MENTHOL 3 MG MT LOZG: 1.0000 | LOZENGE | OROMUCOSAL | Status: DC | PRN

## 2015-04-18 MED ORDER — MIDAZOLAM HCL 2 MG/2ML IJ SOLN
INTRAMUSCULAR | Status: AC
  Filled 2015-04-18: qty 4

## 2015-04-18 MED ORDER — METOCLOPRAMIDE HCL 5 MG/ML IJ SOLN: 5.0000 mg | Freq: Three times a day (TID) | INTRAMUSCULAR | Status: DC | PRN

## 2015-04-18 MED ORDER — LACTATED RINGERS IV SOLN
INTRAVENOUS | Status: DC
  Administered 2015-04-18: 13:00:00 via INTRAVENOUS

## 2015-04-18 SURGICAL SUPPLY — 3 items
COOLER CRYO CUFF IC AND MOTOR (MISCELLANEOUS) ×2 IMPLANT
CUFF CRYO KNEE LG 20X31 COOLER (ORTHOPEDIC SUPPLIES) ×2 IMPLANT
KIT ROOM TURNOVER APOR (KITS) ×2 IMPLANT

## 2015-04-18 NOTE — Op Note (Signed)
04/18/2015  1:50 PM  PATIENT:  Willie Howe  54 y.o. male  PRE-OPERATIVE DIAGNOSIS:  RIGHT KNEE STIFFNESS  POST-OPERATIVE DIAGNOSIS:  RIGHT KNEE STIFFNESS  PROCEDURE:  Procedure(s): MANIPULATION OF RIGHT KNEE UNDER ANESTHESIA (Right)  SURGEON:  Surgeon(s) and Role:    * Carole Civil, MD - Primary  PHYSICIAN ASSISTANT:   ASSISTANTS: none   ANESTHESIA:   general  EBL:  Total I/O In: 600 [I.V.:600] Out: -   BLOOD ADMINISTERED:none  DRAINS: none   LOCAL MEDICATIONS USED:  NONE  SPECIMEN:  No Specimen  DISPOSITION OF SPECIMEN:  N/A  COUNTS:  YES  TOURNIQUET:  * No tourniquets in log *  DICTATION: .Dragon Dictation  PLAN OF CARE: Discharge to home after PACU  PATIENT DISPOSITION:  PACU - hemodynamically stable.   Delay start of Pharmacological VTE agent (>24hrs) due to surgical blood loss or risk of bleeding: no  The patient was brought back to surgery after proper chart update and site marking was given general anesthesia. Then he had a manipulation from 80 preop 210 postop.  Patient taken recovery stable condition

## 2015-04-18 NOTE — Brief Op Note (Addendum)
04/18/2015  1:50 PM  PATIENT:  Willie Howe  54 y.o. male  PRE-OPERATIVE DIAGNOSIS:  RIGHT KNEE STIFFNESS  POST-OPERATIVE DIAGNOSIS:  RIGHT KNEE STIFFNESS  PROCEDURE:  Procedure(s): MANIPULATION OF RIGHT KNEE UNDER ANESTHESIA (Right)  SURGEON:  Surgeon(s) and Role:    * Carole Civil, MD - Primary  PHYSICIAN ASSISTANT:   ASSISTANTS: none   ANESTHESIA:   general  EBL:  Total I/O In: 600 [I.V.:600] Out: -   BLOOD ADMINISTERED:none  DRAINS: none   LOCAL MEDICATIONS USED:  NONE  SPECIMEN:  No Specimen  DISPOSITION OF SPECIMEN:  N/A  COUNTS:  YES  TOURNIQUET:  * No tourniquets in log *  DICTATION: .Dragon Dictation  PLAN OF CARE: Discharge to home after PACU  PATIENT DISPOSITION:  PACU - hemodynamically stable.   Delay start of Pharmacological VTE agent (>24hrs) due to surgical blood loss or risk of bleeding: no  The patient was brought back to surgery after proper chart update and site marking was given general anesthesia. Then he had a manipulation from 80 preop 210 postop.  Patient taken recovery stable condition

## 2015-04-18 NOTE — Interval H&P Note (Signed)
History and Physical Interval Note:  04/18/2015 1:15 PM  Willie Howe  has presented today for surgery, with the diagnosis of RIGHT KNEE STIFFNESS  The various methods of treatment have been discussed with the patient and family. After consideration of risks, benefits and other options for treatment, the patient has consented to  Procedure(s): MANIPULATION OF RIGHT KNEE UNDER ANESTHESIA (Right) as a surgical intervention .  The patient's history has been reviewed, patient examined, no change in status, stable for surgery.  I have reviewed the patient's chart and labs.  Questions were answered to the patient's satisfaction.     Arther Abbott

## 2015-04-18 NOTE — Progress Notes (Signed)
Patient tolerating clear liquids and requesting diet order. Will advance as tolerated and monitor for nausea/vomiting.

## 2015-04-18 NOTE — Progress Notes (Signed)
From OR on stretcher. Transferred self to hospital bed. CPM applied. Cryocuff applied. Right pedal pulse palpable. Moves right toes without difficulty.

## 2015-04-18 NOTE — Anesthesia Procedure Notes (Signed)
Procedure Name: LMA Insertion Date/Time: 04/18/2015 1:27 PM Performed by: Charmaine Downs Pre-anesthesia Checklist: Patient identified, Emergency Drugs available, Suction available and Patient being monitored Patient Re-evaluated:Patient Re-evaluated prior to inductionOxygen Delivery Method: Circle system utilized Preoxygenation: Pre-oxygenation with 100% oxygen Intubation Type: IV induction Ventilation: Mask ventilation without difficulty LMA: LMA inserted LMA Size: 5.0 Grade View: Grade II Tube type: Oral Number of attempts: 1 Placement Confirmation: positive ETCO2 and breath sounds checked- equal and bilateral Tube secured with: Tape Dental Injury: Teeth and Oropharynx as per pre-operative assessment

## 2015-04-18 NOTE — Anesthesia Preprocedure Evaluation (Signed)
Anesthesia Evaluation  Patient identified by MRN, date of birth, ID band Patient awake    Reviewed: Allergy & Precautions, NPO status , Patient's Chart, lab work & pertinent test results  Airway Mallampati: II  TM Distance: >3 FB     Dental  (+) Partial Upper, Dental Advisory Given, Teeth Intact   Pulmonary former smoker, PE breath sounds clear to auscultation        Cardiovascular - anginaDVT (Pulm embolus after back surgery. IVC filter now.) + dysrhythmias (hx irreg HR, neg workup by cardiologist) Rhythm:Regular Rate:Normal     Neuro/Psych  Headaches,    GI/Hepatic negative GI ROS,   Endo/Other  Hyperthyroidism (euthyroid now)   Renal/GU      Musculoskeletal  (+) Arthritis -, Rheumatoid disorders,    Abdominal   Peds  Hematology   Anesthesia Other Findings   Reproductive/Obstetrics                             Anesthesia Physical Anesthesia Plan  ASA: III  Anesthesia Plan: General   Post-op Pain Management:    Induction: Intravenous  Airway Management Planned: LMA  Additional Equipment:   Intra-op Plan:   Post-operative Plan: Extubation in OR  Informed Consent: I have reviewed the patients History and Physical, chart, labs and discussed the procedure including the risks, benefits and alternatives for the proposed anesthesia with the patient or authorized representative who has indicated his/her understanding and acceptance.     Plan Discussed with:   Anesthesia Plan Comments:         Anesthesia Quick Evaluation

## 2015-04-18 NOTE — Anesthesia Postprocedure Evaluation (Signed)
  Anesthesia Post-op Note  Patient: Willie Howe  Procedure(s) Performed: Procedure(s): MANIPULATION OF RIGHT KNEE UNDER ANESTHESIA (Right)  Patient Location: PACU  Anesthesia Type:General  Level of Consciousness: awake, alert , oriented and patient cooperative  Airway and Oxygen Therapy: Patient Spontanous Breathing  Post-op Pain: 4 /10, moderate  Post-op Assessment: Post-op Vital signs reviewed, Patient's Cardiovascular Status Stable, Respiratory Function Stable, Patent Airway, No signs of Nausea or vomiting and Pain level controlled              Post-op Vital Signs: Reviewed and stable  Last Vitals:  Filed Vitals:   04/18/15 1315  BP: 122/84  Pulse:   Temp:   Resp: 24    Complications: No apparent anesthesia complications

## 2015-04-18 NOTE — Transfer of Care (Signed)
Immediate Anesthesia Transfer of Care Note  Patient: Willie Howe  Procedure(s) Performed: Procedure(s): MANIPULATION OF RIGHT KNEE UNDER ANESTHESIA (Right)  Patient Location: PACU  Anesthesia Type:General  Level of Consciousness: awake and patient cooperative  Airway & Oxygen Therapy: Patient Spontanous Breathing and Patient connected to face mask oxygen  Post-op Assessment: Report given to RN, Post -op Vital signs reviewed and stable and Patient moving all extremities  Post vital signs: Reviewed and stable  Last Vitals:  Filed Vitals:   04/18/15 1315  BP: 122/84  Pulse:   Temp:   Resp: 24    Complications: No apparent anesthesia complications

## 2015-04-18 NOTE — Progress Notes (Signed)
Awake. Eyeglasses returned to pt. Sprite given to drink.

## 2015-04-19 ENCOUNTER — Encounter (HOSPITAL_COMMUNITY): Payer: Self-pay | Admitting: Orthopedic Surgery

## 2015-04-19 DIAGNOSIS — M24661 Ankylosis, right knee: Secondary | ICD-10-CM | POA: Diagnosis not present

## 2015-04-19 NOTE — Care Management Note (Signed)
Case Management Note  Patient Details  Name: Willie Howe MRN: [id number] Date of Birth: [birth year]  Expected Discharge Date:    04/19/2015              Expected Discharge Plan:  [place]  In-House Referral:  NA  Discharge planning Services  CM Consult  Post Acute Care Choice:  Durable Medical Equipment, Home Health Choice offered to:  Patient  DME Arranged:  CPM DME Agency:  [place]:  PT [facility] Agency:  [place]  Status of Service:  Completed, signed off  Medicare Important Message Given:    Date Medicare IM Given:    Medicare IM give by:    Date Additional Medicare IM Given:    Additional Medicare Important Message give by:     If discussed at [place] of Stay Meetings, dates discussed:    Additional Comments: Pt is from home, lives with wife. Pt has used [facility] in the past for [facility] and DME and prefers to use them again. Pt will DC today and needs CPM and [place], of [facility] made aware of referral and will obtain pt info from chart. [facility] aware pt needs to be seen tomorrow and will make every effort to do so. Pt's CPM will be delivered to pt's home. No further CM needs at this time.  Sherald Barge, RN 04/19/2015, 1:45 PM

## 2015-04-19 NOTE — Clinical Social Work Note (Signed)
CSW received consult for possible SNF. Pt is being d/c today and PT recommends outpatient PT. CSW will sign off, but can be reconsulted if needed.  Benay Pike, [place]

## 2015-04-19 NOTE — Evaluation (Signed)
Physical Therapy Evaluation Patient Details Name: Willie Howe MRN: [id number] DOB: 05/02/1961 Today's Date: 04/19/2015   History of Present Illness  Pt was admitted for a manipulation of the TKR right due to adhesions limiting flexion.  His TKR was done in June of 2016.  Clinical Impression   Pt was seen for evaluation/tx.  He reports no pain.  He was instructed in methods to use at home to aggressively work on developing knee flexion.  We initiated therapeutic ROM per protocol and we were able to achieve 10-105 degrees with stretching.  He is ambulating well with a cane.  His knee was placed in the CPM set at 0-100 degrees.  He was instructed in use of the cryocuff.  He expects to be discharged today and continue OP PT.    Follow Up Recommendations Outpatient PT    Equipment Recommendations  None recommended by PT    Recommendations for Other Services       Precautions / Restrictions Precautions Precautions: None Restrictions Weight Bearing Restrictions: No      Mobility  Bed Mobility Overal bed mobility: Independent                Transfers Overall transfer level: Independent                  Ambulation/Gait Ambulation/Gait assistance: Independent Ambulation Distance (Feet): 300 Feet Assistive device: Straight cane Gait Pattern/deviations: Decreased stance time - right   Gait velocity interpretation: >2.62 ft/sec, indicative of independent community ambulator General Gait Details: pt states that his gait abnormality has been like this for at least 20 years  Stairs            Wheelchair Mobility    Modified Rankin (Stroke Patients Only)       Balance Overall balance assessment: No apparent balance deficits (not formally assessed)                                           Pertinent Vitals/Pain Pain Assessment: No/denies pain    Home Living Family/patient expects to be discharged to:: Private residence Living  Arrangements: Spouse/significant other Available Help at Discharge: Family;Available 24 hours/day Type of Home: House Home Access: Stairs to enter Entrance Stairs-Rails: Psychiatric nurse of Steps: 5 Home Layout: One level Home Equipment: Cane - single point      Prior Function Level of Independence: Independent with assistive device(s)         Comments: ambulates with a cane     Hand Dominance   Dominant Hand: Right    Extremity/Trunk Assessment               Lower Extremity Assessment: RLE deficits/detail RLE Deficits / Details: ROM of the right knee= 10-105, AA....strength is WNL       Communication   Communication: No difficulties  Cognition Arousal/Alertness: Awake/alert Behavior During Therapy: WFL for tasks assessed/performed Overall Cognitive Status: Within Functional Limits for tasks assessed                      General Comments      Exercises Total Joint Exercises Ankle Circles/Pumps: AROM;Both;10 reps;Seated Quad Sets: AROM;Both;10 reps;Seated Short Arc Quad: AROM;Right;10 reps;Seated Heel Slides: AAROM;10 reps;Right;Seated Knee Flexion: AAROM;Right;5 reps;Seated Goniometric ROM: 10-105 degrees      Assessment/Plan    PT Assessment All further PT needs can be met  in the next venue of care  PT Diagnosis     PT Problem List Decreased range of motion  PT Treatment Interventions     PT Goals (Current goals can be found in the Care Plan section) Acute Rehab PT Goals PT Goal Formulation: All assessment and education complete, DC therapy    Frequency     Barriers to discharge        Co-evaluation               End of Session Equipment Utilized During Treatment: Gait belt Activity Tolerance: Patient tolerated treatment well Patient left: with call bell/phone within reach;in bed;in CPM      Functional Assessment Tool Used: clinical judgement Functional Limitation: Mobility: Walking and moving  around Mobility: Walking and Moving Around Current Status [phone]): 0 percent impaired, limited or restricted Mobility: Walking and Moving Around Goal Status [phone]): 0 percent impaired, limited or restricted Mobility: Walking and Moving Around Discharge Status [phone]): 0 percent impaired, limited or restricted    Time: 4090-5025 PT Time Calculation (min) (ACUTE ONLY): 59 min   Charges:   PT Evaluation $Initial PT Evaluation Tier I: 1 Procedure PT Treatments $Therapeutic Exercise: 8-22 mins $Self Care/Home Management: 8-22   PT G Codes:   PT G-Codes **NOT FOR INPATIENT CLASS** Functional Assessment Tool Used: clinical judgement Functional Limitation: Mobility: Walking and moving around Mobility: Walking and Moving Around Current Status (I1548): 0 percent impaired, limited or restricted Mobility: Walking and Moving Around Goal Status (Y4573): 0 percent impaired, limited or restricted Mobility: Walking and Moving Around Discharge Status (R4483): 0 percent impaired, limited or restricted    Sable Feil  PT 04/19/2015, 1:41 PM [phone]

## 2015-04-19 NOTE — Progress Notes (Signed)
Willie Howe to be D/C'd Home per MD order.  Discussed prescriptions and follow up appointments with the patient. Prescriptions given to patient, medication list explained in detail. Pt verbalized understanding.    Medication List    TAKE these medications        aspirin EC 81 MG tablet  Take 81 mg by mouth daily.     folic acid 1 MG tablet  Commonly known as:  FOLVITE  Take 1 mg by mouth daily.     HYDROcodone-acetaminophen 7.5-325 MG per tablet  Commonly known as:  NORCO  Take 1 tablet by mouth every 4 (four) hours as needed for moderate pain.     methocarbamol 500 MG tablet  Commonly known as:  ROBAXIN  Take 1 tablet (500 mg total) by mouth every 6 (six) hours as needed for muscle spasms.     methotrexate 2.5 MG tablet  Commonly known as:  RHEUMATREX  Take 20 mg by mouth once a week. 8 tablets on Friday. Caution:Chemotherapy. Protect from light.     predniSONE 5 MG tablet  Commonly known as:  DELTASONE  Take 5 mg by mouth daily.     senna 8.6 MG Tabs tablet  Commonly known as:  SENOKOT  Take 1 tablet (8.6 mg total) by mouth 2 (two) times daily.        Filed Vitals:   04/19/15 0642  BP: 136/76  Pulse: 75  Temp: 97.8 F (36.6 C)  Resp: 16    Skin clean, dry and intact without evidence of skin break down, no evidence of skin tears noted. IV catheter discontinued intact. Site without signs and symptoms of complications. Dressing and pressure applied. Pt denies pain at this time. No complaints noted.  An After Visit Summary was printed and given to the patient. Patient escorted via [place], and D/C home via private auto.  Retta Mac BSN, RN

## 2015-04-19 NOTE — Anesthesia Postprocedure Evaluation (Addendum)
  Anesthesia Post-op Note  Patient: Willie Howe  Procedure(s) Performed: Procedure(s): MANIPULATION OF RIGHT KNEE UNDER ANESTHESIA (Right)  Patient Location: Room 311  Anesthesia Type:General  Level of Consciousness: awake, alert , oriented and patient cooperative  Airway and Oxygen Therapy: Patient Spontanous Breathing  Post-op Pain: mild  Post-op Assessment: Nausea post op relieved with Zofran; VSS; Respiratory and cardiovascular stable.              Post-op Vital Signs: Reviewed and stable  Last Vitals:  Filed Vitals:   04/19/15 0642  BP: 136/76  Pulse: 75  Temp: 36.6 C  Resp: 16    Complications: No apparent anesthesia complications

## 2015-04-19 NOTE — Discharge Summary (Signed)
Physician Discharge Summary  Patient ID: Willie Howe MRN: [id number] DOB/AGE: December 13, 1960 53 y.o.  Admit date: 04/18/2015 Discharge date: 04/19/2015  Admission Diagnoses: Arthrofibrosis right knee status post total knee  Discharge Diagnoses: Same Active Problems:   Arthrofibrosis of total knee arthroplasty   Discharge condition stable  Hospital course  Patient had manipulation under anesthesia knee manipulation started at 80 and flexion went up to 110. No comp occasions were noted.  The knee was placed in a CPM and the patient had extended stay to ensure immediate CPM implementation and control pain to assist with aggressive rehabilitation  Discharge Exam: Blood pressure 136/76, pulse 75, temperature 97.8 F (36.6 C), temperature source Oral, resp. rate 16, height 6\' 3"  (1.905 m), weight 276 lb (125.193 kg), SpO2 100 %.   Disposition: 06-Home-Health Care Svc  Discharge Instructions    Ambulatory referral to Physical Therapy    Complete by:  As directed   T.E.N.S. Unit Evaluation and Dispense as Indicated:  Yes  Is this a STAR referral?:  No            Medication List    TAKE these medications        aspirin EC 81 MG tablet  Take 81 mg by mouth daily.     folic acid 1 MG tablet  Commonly known as:  FOLVITE  Take 1 mg by mouth daily.     HYDROcodone-acetaminophen 7.5-325 MG per tablet  Commonly known as:  NORCO  Take 1 tablet by mouth every 4 (four) hours as needed for moderate pain.     methocarbamol 500 MG tablet  Commonly known as:  ROBAXIN  Take 1 tablet (500 mg total) by mouth every 6 (six) hours as needed for muscle spasms.     methotrexate 2.5 MG tablet  Commonly known as:  RHEUMATREX  Take 20 mg by mouth once a week. 8 tablets on Friday. Caution:Chemotherapy. Protect from light.     predniSONE 5 MG tablet  Commonly known as:  DELTASONE  Take 5 mg by mouth daily.     senna 8.6 MG Tabs tablet  Commonly known as:  SENOKOT  Take 1 tablet (8.6 mg  total) by mouth 2 (two) times daily.           Follow-up Information    Follow up with Arther Abbott, MD.   Specialties:  Orthopedic Surgery, Radiology   Contact information:   [address] [place] [address] [phone]       Signed: Arther Abbott 04/19/2015, 12:26 PM

## 2015-04-24 ENCOUNTER — Encounter (HOSPITAL_COMMUNITY): Payer: Self-pay | Admitting: Surgery

## 2015-04-24 ENCOUNTER — Other Ambulatory Visit: Payer: Self-pay | Admitting: *Deleted

## 2015-04-24 ENCOUNTER — Ambulatory Visit (HOSPITAL_COMMUNITY)
Admission: RE | Admit: 2015-04-24 | Discharge: 2015-04-24 | Disposition: A | Discharge status: Home / Self Care | Payer: 59 | Source: Ambulatory Visit | Attending: Surgery | Admitting: Surgery

## 2015-04-24 ENCOUNTER — Encounter (HOSPITAL_COMMUNITY)
Admission: RE | Disposition: A | Discharge status: Home / Self Care | Payer: Self-pay | Source: Ambulatory Visit | Attending: Surgery

## 2015-04-24 DIAGNOSIS — Z7901 Long term (current) use of anticoagulants: Secondary | ICD-10-CM | POA: Insufficient documentation

## 2015-04-24 DIAGNOSIS — E0500 Thyrotoxicosis with diffuse goiter without thyrotoxic crisis or storm: Secondary | ICD-10-CM | POA: Insufficient documentation

## 2015-04-24 DIAGNOSIS — Z96651 Presence of right artificial knee joint: Secondary | ICD-10-CM | POA: Insufficient documentation

## 2015-04-24 DIAGNOSIS — Z8249 Family history of ischemic heart disease and other diseases of the circulatory system: Secondary | ICD-10-CM | POA: Diagnosis not present

## 2015-04-24 DIAGNOSIS — Z4589 Encounter for adjustment and management of other implanted devices: Secondary | ICD-10-CM | POA: Diagnosis not present

## 2015-04-24 DIAGNOSIS — M1990 Unspecified osteoarthritis, unspecified site: Secondary | ICD-10-CM | POA: Insufficient documentation

## 2015-04-24 DIAGNOSIS — I8290 Acute embolism and thrombosis of unspecified vein: Secondary | ICD-10-CM

## 2015-04-24 DIAGNOSIS — Z87442 Personal history of urinary calculi: Secondary | ICD-10-CM | POA: Insufficient documentation

## 2015-04-24 DIAGNOSIS — I82403 Acute embolism and thrombosis of unspecified deep veins of lower extremity, bilateral: Secondary | ICD-10-CM | POA: Diagnosis not present

## 2015-04-24 DIAGNOSIS — I8291 Chronic embolism and thrombosis of unspecified vein: Secondary | ICD-10-CM

## 2015-04-24 DIAGNOSIS — Z86711 Personal history of pulmonary embolism: Secondary | ICD-10-CM | POA: Insufficient documentation

## 2015-04-24 DIAGNOSIS — Z87891 Personal history of nicotine dependence: Secondary | ICD-10-CM | POA: Diagnosis not present

## 2015-04-24 DIAGNOSIS — Z95828 Presence of other vascular implants and grafts: Secondary | ICD-10-CM

## 2015-04-24 DIAGNOSIS — I82220 Acute embolism and thrombosis of inferior vena cava: Secondary | ICD-10-CM | POA: Diagnosis not present

## 2015-04-24 HISTORY — PX: PERIPHERAL VASCULAR CATHETERIZATION: SHX172C

## 2015-04-24 HISTORY — PX: IVC FILTER REMOVAL: CATH118246

## 2015-04-24 LAB — POCT I-STAT, CHEM 8
BUN: 17 mg/dL (ref 6–20)
CALCIUM ION: 1.22 mmol/L (ref 1.12–1.23)
CHLORIDE: 103 mmol/L (ref 101–111)
Creatinine, Ser: 0.9 mg/dL (ref 0.61–1.24)
Glucose, Bld: 133 mg/dL — ABNORMAL HIGH (ref 65–99)
HCT: 44 % (ref 39.0–52.0)
HEMOGLOBIN: 15 g/dL (ref 13.0–17.0)
Potassium: 4 mmol/L (ref 3.5–5.1)
SODIUM: 140 mmol/L (ref 135–145)
TCO2: 25 mmol/L (ref 0–100)

## 2015-04-24 LAB — PROTIME-INR
INR: 1.01 (ref 0.00–1.49)
PROTHROMBIN TIME: 13.5 s (ref 11.6–15.2)

## 2015-04-24 SURGERY — IVC FILTER REMOVAL

## 2015-04-24 MED ORDER — LIDOCAINE HCL (PF) 1 % IJ SOLN
INTRAMUSCULAR | Status: AC
  Filled 2015-04-24: qty 30

## 2015-04-24 MED ORDER — HEPARIN (PORCINE) IN NACL 2-0.9 UNIT/ML-% IJ SOLN
INTRAMUSCULAR | Status: DC | PRN
  Administered 2015-04-24: 08:00:00

## 2015-04-24 MED ORDER — MIDAZOLAM HCL 2 MG/2ML IJ SOLN
INTRAMUSCULAR | Status: AC
  Filled 2015-04-24: qty 4

## 2015-04-24 MED ORDER — SODIUM CHLORIDE 0.9 % IV SOLN
INTRAVENOUS | Status: DC
  Administered 2015-04-24: 1000 mL via INTRAVENOUS

## 2015-04-24 MED ORDER — LABETALOL HCL 5 MG/ML IV SOLN: 10.0000 mg | INTRAVENOUS | Status: DC | PRN

## 2015-04-24 MED ORDER — FENTANYL CITRATE (PF) 100 MCG/2ML IJ SOLN
INTRAMUSCULAR | Status: AC
  Filled 2015-04-24: qty 4

## 2015-04-24 MED ORDER — MIDAZOLAM HCL 2 MG/2ML IJ SOLN
INTRAMUSCULAR | Status: DC | PRN
  Administered 2015-04-24 (×2): 1 mg via INTRAVENOUS

## 2015-04-24 MED ORDER — FENTANYL CITRATE (PF) 100 MCG/2ML IJ SOLN
INTRAMUSCULAR | Status: DC | PRN
  Administered 2015-04-24 (×2): 25 ug via INTRAVENOUS

## 2015-04-24 MED ORDER — HYDRALAZINE HCL 20 MG/ML IJ SOLN: 5.0000 mg | INTRAMUSCULAR | Status: DC | PRN

## 2015-04-24 MED ORDER — IOHEXOL 350 MG/ML SOLN
INTRAVENOUS | Status: DC | PRN
  Administered 2015-04-24: 60 mL via INTRAVENOUS

## 2015-04-24 MED ORDER — HEPARIN (PORCINE) IN NACL 2-0.9 UNIT/ML-% IJ SOLN
INTRAMUSCULAR | Status: AC
  Filled 2015-04-24: qty 1000

## 2015-04-24 MED ORDER — ONDANSETRON HCL 4 MG/2ML IJ SOLN: 4.0000 mg | Freq: Four times a day (QID) | INTRAMUSCULAR | Status: DC | PRN

## 2015-04-24 SURGICAL SUPPLY — 12 items
CATH OMNI FLUSH 5F 65CM (CATHETERS) ×2 IMPLANT
COVER PRB 48X5XTLSCP FOLD TPE (BAG) ×1 IMPLANT
COVER PROBE 5X48 (BAG) ×1
KIT MICROINTRODUCER STIFF 5F (SHEATH) ×2 IMPLANT
KIT PV (KITS) ×4 IMPLANT
SHEATH PINNACLE 5F 10CM (SHEATH) ×2 IMPLANT
SHIELD RADPAD SCOOP 12X17 (MISCELLANEOUS) ×2 IMPLANT
SYR MEDRAD MARK V 150ML (SYRINGE) ×2 IMPLANT
TRANSDUCER W/STOPCOCK (MISCELLANEOUS) IMPLANT
TRAY PV CATH (CUSTOM PROCEDURE TRAY) ×2 IMPLANT
TUBING HIGH PRESSURE 120CM (CONNECTOR) ×2 IMPLANT
WIRE BENTSON .035X145CM (WIRE) ×2 IMPLANT

## 2015-04-24 NOTE — Discharge Instructions (Signed)
Wound Care Wound care helps prevent pain and infection.   HOME CARE   Only take medicine as told by your doctor.  Clean the wound after 24 hours with mild soap and water.  Removed bandages (dressings) in 24 hours or as told by your doctor.  Put medicated cream and a bandage on the wound as told by your doctor.  Change the bandage if it gets wet, dirty, or starts to smell.  Take showers. Do not take baths, swim, or do anything that puts your wound under water.  Rest and raise (elevate) the wound until the pain and puffiness (swelling) are better.  Keep all doctor visits as told. GET HELP RIGHT AWAY IF:   Yellowish-white fluid (pus) comes from the wound.  Medicine does not lessen your pain.  There is a red streak going away from the wound.  You have a fever. MAKE SURE YOU:   Understand these instructions.  Will watch your condition.  Will get help right away if you are not doing well or get worse. Document Released: 05/13/2008 Document Revised: 10/27/2011 Document Reviewed: 12/08/2010 [facility] Patient Information 2015 [place], [place]. This information is not intended to replace advice given to you by your health care provider. Make sure you discuss any questions you have with your health care provider.

## 2015-04-24 NOTE — H&P (Signed)
Patient name: Willie Soley KingMRN: [id number] DOB: [birth year]Sex: male    Chief Complaint  Patient presents with  . Re-evaluation    R/O DVT    HISTORY OF PRESENT ILLNESS: The patient is back today for follow-up. He is status post placement of an IVC filter on 01/30/2015. This was a Actor. This was done because of his history of DVT/PE, and his upcoming orthopedic procedure (knee replacement on the right). He tolerated his surgery without difficulty. He is now in the process of undergoing rehabilitation for his knee. He is taking Coumadin which was started in the perioperative period  Past Medical History  Diagnosis Date  . Thyroid disease     Graves Disease  . Previous back surgery     x 3  . Dysrhythmia   . Anginal pain   . DVT (deep venous thrombosis)   . History of pulmonary embolism   . Graves disease 1996  . Kidney stone   . Arthritis     Past Surgical History  Procedure Laterality Date  . Knee surgery      x 2  . Nephrolithotomy    . Back surgery    . Colonoscopy    . Lumbar wound debridement N/A 02/23/2014    Procedure: LUMBAR WOUND DEBRIDEMENT; Surgeon: Faythe Ghee, MD; Location: [facility]; Service: Neurosurgery; Laterality: N/A; exploration lumbar wound. repair of dural defect.  Marland Kitchen Spine surgery  2015    Dr Hal Neer Fusion   . Spine surgery  1999    discectomy  . Knee surgery Left 1976    ?ligament repair   . Menisectomy Right     open medial   . Knee arthroscopy Right 2006    meniscus   . Peripheral vascular catheterization N/A 01/30/2015    Procedure: IVC Filter Insertion; Surgeon: Serafina Mitchell, MD; Location: [facility]; Service: Cardiovascular; Laterality: N/A;  . Ivc filter    . Total knee arthroplasty Right 02/02/2015    Procedure: RIGHT TOTAL KNEE ARTHROPLASTY;  Surgeon: Carole Civil, MD; Location: [facility]; Service: Orthopedics; Laterality: Right; LM with pt's daughter of new arrival time (10:45)     History   Social History  . Marital Status: Married    Spouse Name: N/A  . Number of Children: N/A  . Years of Education: N/A   Occupational History  . Not on file.   Social History Main Topics  . Smoking status: Former Smoker -- 1.50 packs/day for 20 years    Types: Cigarettes    Quit date: 02/03/1995  . Smokeless tobacco: Never Used  . Alcohol Use: No  . Drug Use: No  . Sexual Activity: Yes   Other Topics Concern  . Not on file   Social History Narrative    Family History  Problem Relation Age of Onset  . CAD Sister   . Deep vein thrombosis Mother   . Hypertension Mother   . Hypertension Father     Allergies as of 02/26/2015  . (No Known Allergies)    Current Outpatient Prescriptions on File Prior to Visit  Medication Sig Dispense Refill  . folic acid (FOLVITE) 1 MG tablet Take 1 mg by mouth daily.    Marland Kitchen HYDROcodone-acetaminophen (NORCO) 10-325 MG per tablet Take 1 tablet by mouth every 4 (four) hours as needed. 84 tablet 0  . methocarbamol (ROBAXIN) 500 MG tablet Take 1 tablet (500 mg total) by mouth every 6 (six) hours as needed for muscle spasms. [place]  tablet 2  . methotrexate (RHEUMATREX) 2.5 MG tablet Take 20 mg by mouth once a week. 8 tablets on Friday. Caution:Chemotherapy. Protect from light.    . predniSONE (DELTASONE) 5 MG tablet Take 5 mg by mouth daily.     Marland Kitchen senna (SENOKOT) 8.6 MG TABS tablet Take 1 tablet (8.6 mg total) by mouth 2 (two) times daily. 120 each 0  . warfarin (COUMADIN) 2.5 MG tablet Take 1 tablet (2.5 mg total) by mouth daily. 14 tablet 0  . cyclobenzaprine (FEXMID) 7.5 MG tablet Take 1 tablet (7.5 mg total) by mouth 3 (three) times daily as needed for muscle  spasms. (Patient not taking: Reported on 01/09/2015) 30 tablet 0   No current facility-administered medications on file prior to visit.     REVIEW OF SYSTEMS: Cardiovascular: No chest pain, chest pressure, palpitations, orthopnea, or dyspnea on exertion. No claudication or rest pain, history of DVT Pulmonary: No productive cough, asthma or wheezing. Neurologic: No weakness, paresthesias, aphasia, or amaurosis. No dizziness. Hematologic: No bleeding problems or clotting disorders. Musculoskeletal: Recent right knee replacement. Gastrointestinal: No blood in stool or hematemesis Genitourinary: No dysuria or hematuria. Psychiatric:: No history of major depression. Integumentary: No rashes or ulcers. Constitutional: No fever or chills.  PHYSICAL EXAMINATION:  Vital signs are  Filed Vitals:   02/26/15 1609  Height: 6\' 3"  (1.905 m)  Weight: 281 lb 12.8 oz (127.824 kg)   Body mass index is 35.22 kg/(m^2). General: The patient appears their stated age. HEENT: No gross abnormalities Pulmonary: Non labored breathing Musculoskeletal: There are no major deformities. Neurologic: No focal weakness or paresthesias are detected, Skin: There are no ulcer or rashes noted. Psychiatric: The patient has normal affect. Cardiovascular: Right leg is edematous following his knee replacement surgery   Diagnostic Studies Duplex ultrasound was performed today. There is no evidence of acute DVT.  Assessment: Status post placement of an IVC filter, prior to orthopedic surgery Plan: The patient's Doppler study was negative for DVT today. We discussed removing the filter through a right internal jugular vein approach. The patient is currently on Coumadin. He is to see his orthopedist this Thursday and the stop date for Coumadin will be determined. We discussed setting him up for IVC filter removal on July 26/27 over August 16/17. The patient will contact me after he sees his  orthopedic surgeon.  Eldridge Abrahams, M.D. [facility] Office: [phone] Pager: [phone]        No changes. Procedure delayed due to ortho issues  Willie Howe

## 2015-04-24 NOTE — Op Note (Signed)
    Patient name: Willie Howe MRN: [id number] DOB: 10-13-1960 Sex: male  04/24/2015 Pre-operative Diagnosis: IVC filter/DVT Post-operative diagnosis:  Same Surgeon:  [name] Procedure Performed:  1.  Ultrasound-guided access, right internal jugular vein  2.  Catheter in inferior vena cava  3.  Inferior venacavogram  4.  Conscious sedation 30 minutes   Indications:  The patient has a history of DVT.  He had a preoperative IVC filter placed for orthopedic surgery.  He is maintained on Coumadin.  His Coumadin has now been off for approximately one month.  He did have a duplex in the office which was negative for DVT.  He is here for filter removal  Procedure:  The patient was identified in the holding area and taken to room 8.  The patient was then placed supine on the table and prepped and draped in the usual sterile fashion.  A time out was called.  Ultrasound was used to evaluate the right internal jugular vein which was widely patent and easily compressible.  1% lidocaine was used for local anesthesia.  A #11 blade was used to make a skin nick.  2 mg of Versed and 50 mg of fentanyl were used for conscious sedation with continuous vital sign monitoring by myself for 30 minutes.  Using a micropuncture needle, right internal jugular vein was cannulated under ultrasound guidance.  An 014 wire was advanced without resistance and a micropuncture sheath was placed.  Next a 035 Bentson wire was inserted and advanced under fluoroscopic visualization into the inferior vena cava.  The micropuncture sheath was removed and a Omni flush catheter was advanced down to the vena cava bifurcation.  Contrast imaging was obtained in 2 separate oblique views.  Findings:   Vena cava:  The vena cava filter is in place just below the renal veins.  The vena cava is widely patent.  A IVC filter is positioned appropriately.  There is thrombus noted within the inferior vena cava filter.  After thrombus was identified  within the filter, the procedure was terminated.  Intervention:  None   Impression:  #1  thrombus visualized within the inferior vena cava filter  #2  the filter will not be removed today.  The patient will be restarted on his anticoagulation.  I will check a CT scan in approximately 3 months.  If the thrombus has resolved, I would still consider taking filter out.  If it persists, this will become a permanent vena cava filter.    Theotis Burrow, M.D. [facility] Office: [phone] Pager:  [phone]

## 2015-04-25 ENCOUNTER — Ambulatory Visit (INDEPENDENT_AMBULATORY_CARE_PROVIDER_SITE_OTHER): Payer: Self-pay | Admitting: Orthopedic Surgery

## 2015-04-25 ENCOUNTER — Encounter: Payer: Self-pay | Admitting: Orthopedic Surgery

## 2015-04-25 VITALS — BP 123/83 | Ht 75.0 in | Wt 275.0 lb

## 2015-04-25 DIAGNOSIS — M25661 Stiffness of right knee, not elsewhere classified: Secondary | ICD-10-CM

## 2015-04-25 DIAGNOSIS — Z96698 Presence of other orthopedic joint implants: Secondary | ICD-10-CM

## 2015-04-25 DIAGNOSIS — I82401 Acute embolism and thrombosis of unspecified deep veins of right lower extremity: Secondary | ICD-10-CM

## 2015-04-25 DIAGNOSIS — T8482XD Fibrosis due to internal orthopedic prosthetic devices, implants and grafts, subsequent encounter: Secondary | ICD-10-CM

## 2015-04-25 DIAGNOSIS — Z9660 Presence of unspecified orthopedic joint implant: Secondary | ICD-10-CM

## 2015-04-25 DIAGNOSIS — Z471 Aftercare following joint replacement surgery: Secondary | ICD-10-CM

## 2015-04-25 NOTE — Progress Notes (Signed)
Patient ID: SPARSH CALLENS, male   DOB: [birth year], 54 y.o.   MRN: [id number]  Follow up visit  Chief Complaint  Patient presents with  . Follow-up    post op 1, right knee manipulation, DOS 04/18/15, right total knee 01/31/2015    BP 123/83 mmHg  Ht 6\' 3"  (1.905 m)  Wt 275 lb (124.739 kg)  BMI 34.37 kg/m2  Encounter Diagnoses  Name Primary?  . Knee stiffness, right   . Aftercare following other joint replacement surgery   . Arthrofibrosis of total knee replacement, subsequent encounter Yes  . Deep vein thrombosis, right     [name] had his manipulation we got him to about 110 we hope to maintain 105, he got up to 107 with a lot of effort today. He comes in walking with a cane. Unfortunately he did have a clot in his IVC filter which required the filter to be left in place and resumption of Coumadin  His knee looks good his pain is controlled with hydrocodone 7.5 mg we did a refill today  We will start therapy at their a [facility] and follow-up with me in 4 weeks with a goal of 105 of knee flexion.

## 2015-04-25 NOTE — Patient Instructions (Signed)
CALL [facility] TO ARRANGE THERAPY VISITS

## 2015-04-26 ENCOUNTER — Ambulatory Visit: Payer: 59 | Admitting: Orthopedic Surgery

## 2015-05-01 ENCOUNTER — Telehealth: Payer: Self-pay | Admitting: Surgery

## 2015-05-01 NOTE — Telephone Encounter (Signed)
Spoke with Mr Willie Howe to schedule all appointments, sent CT letter. dpm

## 2015-05-01 NOTE — Telephone Encounter (Signed)
-----   Message from Mena Goes, RN sent at 04/24/2015  2:56 PM EDT ----- Regarding: Schedule   ----- Message -----    From: Serafina Mitchell, MD    Sent: 04/24/2015   8:29 AM      To: [facility] Charge Pool  04/24/2015:  Surgeon:  [name] Procedure Performed:  1.  Ultrasound-guided access, right internal jugular vein  2.  Catheter in inferior vena cava  3.  Inferior venacavogram  4.  Conscious sedation 30 minutes  Please schedule the patient to follow-up with me in the office in 3 months with a CT venogram to evaluate for IVC filter thrombus.  He will also need another lower extremity venous Doppler to rule out DVT.  The patient is going to need to be restarted on his Coumadin.  This was managed by his primary care office.  We facilitate getting this restarted and follow-up appointments.

## 2015-05-04 NOTE — Telephone Encounter (Signed)
I called [name] and spoke to his wife, she said that he is already back on his Coumadin. He is managed by Dr. Tollie Pizza and Mrs. Willie Howe said that he is having no problems presently.

## 2015-05-17 ENCOUNTER — Other Ambulatory Visit: Payer: Self-pay | Admitting: Orthopedic Surgery

## 2015-05-24 ENCOUNTER — Ambulatory Visit: Payer: 59 | Admitting: Orthopedic Surgery

## 2015-05-31 ENCOUNTER — Ambulatory Visit (INDEPENDENT_AMBULATORY_CARE_PROVIDER_SITE_OTHER): Payer: Self-pay | Admitting: Orthopedic Surgery

## 2015-05-31 VITALS — BP 119/83 | Ht 75.0 in | Wt 275.0 lb

## 2015-05-31 DIAGNOSIS — T8482XD Fibrosis due to internal orthopedic prosthetic devices, implants and grafts, subsequent encounter: Secondary | ICD-10-CM

## 2015-05-31 DIAGNOSIS — Z96698 Presence of other orthopedic joint implants: Secondary | ICD-10-CM

## 2015-05-31 DIAGNOSIS — Z9660 Presence of unspecified orthopedic joint implant: Secondary | ICD-10-CM

## 2015-05-31 DIAGNOSIS — Z471 Aftercare following joint replacement surgery: Secondary | ICD-10-CM

## 2015-05-31 MED ORDER — HYDROCODONE-ACETAMINOPHEN 7.5-325 MG PO TABS: 1.0000 | ORAL_TABLET | ORAL | Status: DC | PRN

## 2015-05-31 NOTE — Progress Notes (Signed)
Patient ID: Willie Howe, male   DOB: [birth year], 54 y.o.   MRN: [id number]  Follow up visit  Chief Complaint  Patient presents with  . Follow-up    follow up Rt knee MUA, DOS 8/31/160; 01-31-2015 dos tka    Ht 6\' 3"  (1.905 m)  Wt 275 lb (124.739 kg)  BMI 34.37 kg/m2  Encounter Diagnoses  Name Primary?  Marland Kitchen Aftercare following other joint replacement surgery   . Arthrofibrosis of total knee replacement, subsequent encounter Yes    43 DAYS POT mua   PAIN = MILD TO MODERATE WITH THERAPY   ROM = 105  Much improved at their a sports continue hydrocodone continue therapy follow-up 6 weeks again goal 105

## 2015-06-26 ENCOUNTER — Telehealth: Payer: Self-pay | Admitting: Orthopedic Surgery

## 2015-06-26 ENCOUNTER — Other Ambulatory Visit: Payer: Self-pay | Admitting: *Deleted

## 2015-06-26 MED ORDER — HYDROCODONE-ACETAMINOPHEN 7.5-325 MG PO TABS: 1.0000 | ORAL_TABLET | ORAL | Status: DC | PRN

## 2015-06-26 NOTE — Telephone Encounter (Signed)
Patient called for refill, pain medication: HYDROcodone-acetaminophen (NORCO) 7.5-325 MG tablet [[id number]] - his cell ph# is [phone]

## 2015-06-27 NOTE — Telephone Encounter (Signed)
Prescription available, patient aware  

## 2015-07-17 ENCOUNTER — Ambulatory Visit (INDEPENDENT_AMBULATORY_CARE_PROVIDER_SITE_OTHER): Payer: Self-pay | Admitting: Orthopedic Surgery

## 2015-07-17 VITALS — BP 133/87 | Ht 75.0 in | Wt 275.0 lb

## 2015-07-17 DIAGNOSIS — Z9660 Presence of unspecified orthopedic joint implant: Secondary | ICD-10-CM

## 2015-07-17 DIAGNOSIS — Z96698 Presence of other orthopedic joint implants: Principal | ICD-10-CM

## 2015-07-17 DIAGNOSIS — Z471 Aftercare following joint replacement surgery: Secondary | ICD-10-CM

## 2015-07-17 DIAGNOSIS — T8482XD Fibrosis due to internal orthopedic prosthetic devices, implants and grafts, subsequent encounter: Secondary | ICD-10-CM

## 2015-07-17 NOTE — Progress Notes (Signed)
Chief Complaint  Patient presents with  . Follow-up    6 week recheck on right knee, DOS 04-18-15.    The patient had 2 surgeries he had manipulation in August had total knee in June 2016, gated by blood clot still on Coumadin  Now has 95 of flexion with less than 5 flexion contracture  Does have intermittent swelling  Therapy was stopped because his insurance only allowed 23 visit ceased doing his own therapy at home  Continue therapy at home follow-up 3 months

## 2015-07-24 ENCOUNTER — Ambulatory Visit
Admission: RE | Admit: 2015-07-24 | Discharge: 2015-07-24 | Disposition: A | Discharge status: Home / Self Care | Payer: 59 | Source: Ambulatory Visit | Attending: Surgery | Admitting: Surgery

## 2015-07-24 ENCOUNTER — Encounter: Payer: Self-pay | Admitting: Surgery

## 2015-07-24 DIAGNOSIS — Z95828 Presence of other vascular implants and grafts: Secondary | ICD-10-CM

## 2015-07-24 DIAGNOSIS — I8291 Chronic embolism and thrombosis of unspecified vein: Secondary | ICD-10-CM

## 2015-07-24 MED ORDER — IOPAMIDOL (ISOVUE-300) INJECTION 61%
125.0000 mL | Freq: Once | INTRAVENOUS | Status: AC | PRN
  Administered 2015-07-24: 125 mL via INTRAVENOUS

## 2015-07-30 ENCOUNTER — Encounter: Payer: Self-pay | Admitting: Surgery

## 2015-07-30 ENCOUNTER — Ambulatory Visit (HOSPITAL_COMMUNITY)
Admission: RE | Admit: 2015-07-30 | Discharge: 2015-07-30 | Disposition: A | Discharge status: Home / Self Care | Payer: 59 | Source: Ambulatory Visit | Attending: Surgery | Admitting: Surgery

## 2015-07-30 ENCOUNTER — Ambulatory Visit (INDEPENDENT_AMBULATORY_CARE_PROVIDER_SITE_OTHER): Payer: 59 | Admitting: Surgery

## 2015-07-30 ENCOUNTER — Other Ambulatory Visit: Payer: Self-pay | Admitting: *Deleted

## 2015-07-30 ENCOUNTER — Telehealth: Payer: Self-pay | Admitting: *Deleted

## 2015-07-30 VITALS — BP 123/76 | HR 86 | Ht 75.0 in | Wt 282.0 lb

## 2015-07-30 DIAGNOSIS — Z9889 Other specified postprocedural states: Secondary | ICD-10-CM | POA: Diagnosis not present

## 2015-07-30 DIAGNOSIS — I8290 Acute embolism and thrombosis of unspecified vein: Secondary | ICD-10-CM

## 2015-07-30 DIAGNOSIS — I82409 Acute embolism and thrombosis of unspecified deep veins of unspecified lower extremity: Secondary | ICD-10-CM | POA: Diagnosis not present

## 2015-07-30 DIAGNOSIS — Z95828 Presence of other vascular implants and grafts: Secondary | ICD-10-CM

## 2015-07-30 MED ORDER — HYDROCODONE-ACETAMINOPHEN 7.5-325 MG PO TABS: 1.0000 | ORAL_TABLET | ORAL | Status: DC | PRN

## 2015-07-30 NOTE — Telephone Encounter (Signed)
Patient called requesting hydrocodone to be refilled, Patient said he have right knee swelling it started within the last month off and on and within the last week it has been worse. Please advise [phone]

## 2015-07-30 NOTE — Telephone Encounter (Signed)
i ll see him mon the 19th

## 2015-07-30 NOTE — Telephone Encounter (Signed)
Patient is scheduled for 08/06/15 at 10:00. Patient is aware his hydrocodone is ready to be picked up

## 2015-07-30 NOTE — Progress Notes (Signed)
Patient name: Willie Howe MRN: [id number] DOB: Feb 08, 1961 Sex: male     Chief Complaint  Patient presents with  . Re-evaluation    3 month f/u w/ CT prior - eval IVC filter    HISTORY OF PRESENT ILLNESS: The patient had a preoperative IVC filter placed for orthopedic surgery.  He has been maintained on Coumadin.  Prior to attempted filter removal 3 months ago, he had a venous duplex which was negative for DVT.  At the time of his venogram, thrombus was seen within the filter.  I elected to observe him for another 3 months and have them brought back with a CT scan.  He has no complaints at this time.  Past Medical History  Diagnosis Date  . Thyroid disease     Graves Disease  . Previous back surgery     x 3  . Dysrhythmia   . Anginal pain ([place])   . DVT (deep venous thrombosis) ([place])   . History of pulmonary embolism   . Graves disease 1996  . Arthritis   . History of kidney stones     Past Surgical History  Procedure Laterality Date  . Knee surgery      x 2  . Nephrolithotomy    . Back surgery    . Colonoscopy    . Lumbar wound debridement N/A 02/23/2014    Procedure: LUMBAR WOUND DEBRIDEMENT;  Surgeon: Faythe Ghee, MD;  Location: [facility];  Service: Neurosurgery;  Laterality: N/A;  exploration lumbar wound. repair of dural defect.  Marland Kitchen Spine surgery  2015    Dr Hal Neer Fusion   . Spine surgery  1999    discectomy  . Knee surgery Left 1976    ?ligament repair   . Menisectomy Right     open medial   . Knee arthroscopy Right 2006    meniscus   . Peripheral vascular catheterization N/A 01/30/2015    Procedure: IVC Filter Insertion;  Surgeon: Serafina Mitchell, MD;  Location: [facility];  Service: Cardiovascular;  Laterality: N/A;  . Ivc filter    . Total knee arthroplasty Right 02/02/2015    Procedure:  RIGHT TOTAL KNEE ARTHROPLASTY;  Surgeon: Carole Civil, MD;  Location: [facility];  Service: Orthopedics;  Laterality: Right;  LM with pt's daughter of new  arrival time (10:45)    . Exam under anesthesia with manipulation of knee Right 04/18/2015    Procedure: MANIPULATION OF RIGHT KNEE UNDER ANESTHESIA;  Surgeon: Carole Civil, MD;  Location: [facility];  Service: Orthopedics;  Laterality: Right;  . Peripheral vascular catheterization N/A 04/24/2015    Procedure: IVC Filter Removal;  Surgeon: Serafina Mitchell, MD;  Location: [facility];  Service: Cardiovascular;  Laterality: N/A;    Social History   Social History  . Marital Status: Married    Spouse Name: N/A  . Number of Children: N/A  . Years of Education: N/A   Occupational History  . Not on file.   Social History Main Topics  . Smoking status: Former Smoker -- 1.50 packs/day for 20 years    Types: Cigarettes    Quit date: 02/03/1995  . Smokeless tobacco: Never Used  . Alcohol Use: No  . Drug Use: No  . Sexual Activity: Yes   Other Topics Concern  . Not on file   Social History Narrative    Family History  Problem Relation Age of Onset  . CAD Sister   .  Deep vein thrombosis Mother   . Hypertension Mother   . Hypertension Father     Allergies as of 07/30/2015  . (No Known Allergies)    Current Outpatient Prescriptions on File Prior to Visit  Medication Sig Dispense Refill  . folic acid (FOLVITE) 1 MG tablet Take 1 mg by mouth daily.    . methotrexate (RHEUMATREX) 2.5 MG tablet Take 20 mg by mouth once a week. 8 tablets on Friday. Caution:Chemotherapy. Protect from light.    . predniSONE (DELTASONE) 5 MG tablet Take 5 mg by mouth daily.     Marland Kitchen senna (SENOKOT) 8.6 MG TABS tablet Take 1 tablet (8.6 mg total) by mouth 2 (two) times daily. 120 each 0  . WARFARIN SODIUM PO Take by mouth.     No current facility-administered medications on file prior to visit.     REVIEW OF SYSTEMS: Cardiovascular: No chest pain, chest pressure. Pulmonary: No productive cough, asthma or wheezing. Neurologic: No dizziness. Hematologic: No bleeding problems or clotting  disorders. Musculoskeletal: No joint pain or joint swelling. Gastrointestinal: No blood in stool or hematemesis Genitourinary: No dysuria or hematuria. Psychiatric:: No history of major depression. Integumentary: No rashes or ulcers. Constitutional: No fever or chills.  PHYSICAL EXAMINATION:   Vital signs are  Filed Vitals:   07/30/15 1513  BP: 123/76  Pulse: 86  Height: 6\' 3"  (1.905 m)  Weight: 282 lb (127.914 kg)  SpO2: 98%   Body mass index is 35.25 kg/(m^2). General: The patient appears their stated age. HEENT:  No gross abnormalities Pulmonary:  Non labored breathing Musculoskeletal: There are no major deformities. Neurologic: No focal weakness or paresthesias are detected, Skin: There are no ulcer or rashes noted. Psychiatric: The patient has normal affect.    Diagnostic Studies I have reviewed his CT scan.  There is no definitive evidence for thrombus within the IVC filter  Assessment: Status post IVC filter placement Plan: Based on the CT scan, I do not see any further thrombus within the filter.  Therefore I have recommended proceeding with filter removal.  This is been scheduled for Wednesday, December 27.  I have asked that his last Coumadin dose be the morning of December 23.    Eldridge Abrahams, M.D. [facility] Office: [phone] Pager:  [phone]

## 2015-07-30 NOTE — Telephone Encounter (Signed)
Routing to front to schedule

## 2015-07-30 NOTE — Telephone Encounter (Signed)
Routing to Dr Aline Brochure for review  Prescription printed

## 2015-07-31 ENCOUNTER — Other Ambulatory Visit: Payer: Self-pay

## 2015-07-31 NOTE — Telephone Encounter (Signed)
Patient picked up his rx

## 2015-08-06 ENCOUNTER — Ambulatory Visit (INDEPENDENT_AMBULATORY_CARE_PROVIDER_SITE_OTHER): Payer: 59

## 2015-08-06 ENCOUNTER — Ambulatory Visit (INDEPENDENT_AMBULATORY_CARE_PROVIDER_SITE_OTHER): Payer: 59 | Admitting: Orthopedic Surgery

## 2015-08-06 VITALS — BP 126/81 | Ht 75.0 in | Wt 275.0 lb

## 2015-08-06 DIAGNOSIS — M25469 Effusion, unspecified knee: Secondary | ICD-10-CM

## 2015-08-06 LAB — CBC WITH DIFFERENTIAL/PLATELET
BASOS PCT: 0 % (ref 0–1)
Basophils Absolute: 0 10*3/uL (ref 0.0–0.1)
EOS ABS: 0.1 10*3/uL (ref 0.0–0.7)
EOS PCT: 1 % (ref 0–5)
HCT: 40.9 % (ref 39.0–52.0)
Hemoglobin: 13.6 g/dL (ref 13.0–17.0)
Lymphocytes Relative: 23 % (ref 12–46)
Lymphs Abs: 1.8 10*3/uL (ref 0.7–4.0)
MCH: 28.4 pg (ref 26.0–34.0)
MCHC: 33.3 g/dL (ref 30.0–36.0)
MCV: 85.4 fL (ref 78.0–100.0)
MONO ABS: 0.5 10*3/uL (ref 0.1–1.0)
MONOS PCT: 7 % (ref 3–12)
MPV: 9.6 fL (ref 8.6–12.4)
NEUTROS PCT: 69 % (ref 43–77)
Neutro Abs: 5.3 10*3/uL (ref 1.7–7.7)
Platelets: 269 10*3/uL (ref 150–400)
RBC: 4.79 MIL/uL (ref 4.22–5.81)
RDW: 16.7 % — ABNORMAL HIGH (ref 11.5–15.5)
WBC: 7.7 10*3/uL (ref 4.0–10.5)

## 2015-08-06 LAB — C-REACTIVE PROTEIN

## 2015-08-06 NOTE — Progress Notes (Signed)
Chief Complaint  Patient presents with  . Follow-up    Recheck right knee, MUA on 04-18-15.    The patient had a's index total knee on June 2016 at manipulation as described above in August  Had a filter placed for history of pulmonary embolus.  He tells me that that filter will come out as his CT scan and ultrasound if shoulders no clot  He came in today because his knee is swelling intermittently  No pain or graft thought it might have buckled on him last week  Review of systems he denies any fever denies redness around the knee.  No increase in pain  Exam he is awake alert and oriented 3 mood and affect are normal he is appearance he is well-groomed BP 126/81 mmHg  Ht 6\' 3"  (1.905 m)  Wt 275 lb (124.739 kg)  BMI 34.37 kg/m2  He is ambulatory without assistive device  He has a slight flexion contracture approximately 100 of knee flexion He has excellent extension motor strength No instability in either plane The knee is not warm swollen and does not have effusion Skin incision clean dry intact no erythema  Normal sensation distally Very mild pitting edema distally  We will order a sedimentation rate C-reactive protein and CBC, call results   X-ray today shows no instability or loosening of the prosthesis no effusion

## 2015-08-06 NOTE — Patient Instructions (Signed)
Go to lab for blood draw Dr Aline Brochure will call the results to you in 5 days

## 2015-08-07 LAB — SEDIMENTATION RATE: Sed Rate: 4 mm/hr (ref 0–20)

## 2015-08-14 ENCOUNTER — Encounter (HOSPITAL_COMMUNITY)
Admission: RE | Disposition: A | Discharge status: Home / Self Care | Payer: 59 | Source: Ambulatory Visit | Attending: Surgery

## 2015-08-14 ENCOUNTER — Ambulatory Visit (HOSPITAL_COMMUNITY)
Admission: RE | Admit: 2015-08-14 | Discharge: 2015-08-14 | Disposition: A | Discharge status: Home / Self Care | Payer: 59 | Source: Ambulatory Visit | Attending: Surgery | Admitting: Surgery

## 2015-08-14 DIAGNOSIS — Z7901 Long term (current) use of anticoagulants: Secondary | ICD-10-CM | POA: Diagnosis not present

## 2015-08-14 DIAGNOSIS — Z8249 Family history of ischemic heart disease and other diseases of the circulatory system: Secondary | ICD-10-CM | POA: Diagnosis not present

## 2015-08-14 DIAGNOSIS — Z87442 Personal history of urinary calculi: Secondary | ICD-10-CM | POA: Diagnosis not present

## 2015-08-14 DIAGNOSIS — M1990 Unspecified osteoarthritis, unspecified site: Secondary | ICD-10-CM | POA: Diagnosis not present

## 2015-08-14 DIAGNOSIS — I82409 Acute embolism and thrombosis of unspecified deep veins of unspecified lower extremity: Secondary | ICD-10-CM | POA: Diagnosis not present

## 2015-08-14 DIAGNOSIS — Z7952 Long term (current) use of systemic steroids: Secondary | ICD-10-CM | POA: Diagnosis not present

## 2015-08-14 DIAGNOSIS — Z86711 Personal history of pulmonary embolism: Secondary | ICD-10-CM | POA: Diagnosis not present

## 2015-08-14 DIAGNOSIS — E0500 Thyrotoxicosis with diffuse goiter without thyrotoxic crisis or storm: Secondary | ICD-10-CM | POA: Diagnosis not present

## 2015-08-14 DIAGNOSIS — Z4689 Encounter for fitting and adjustment of other specified devices: Secondary | ICD-10-CM | POA: Insufficient documentation

## 2015-08-14 DIAGNOSIS — Z87891 Personal history of nicotine dependence: Secondary | ICD-10-CM | POA: Insufficient documentation

## 2015-08-14 DIAGNOSIS — Z86718 Personal history of other venous thrombosis and embolism: Secondary | ICD-10-CM | POA: Insufficient documentation

## 2015-08-14 HISTORY — PX: PERIPHERAL VASCULAR CATHETERIZATION: SHX172C

## 2015-08-14 LAB — POCT I-STAT, CHEM 8
BUN: 11 mg/dL (ref 6–20)
Calcium, Ion: 1.1 mmol/L — ABNORMAL LOW (ref 1.12–1.23)
Chloride: 106 mmol/L (ref 101–111)
Creatinine, Ser: 0.7 mg/dL (ref 0.61–1.24)
Glucose, Bld: 132 mg/dL — ABNORMAL HIGH (ref 65–99)
HEMATOCRIT: 45 % (ref 39.0–52.0)
HEMOGLOBIN: 15.3 g/dL (ref 13.0–17.0)
POTASSIUM: 4.1 mmol/L (ref 3.5–5.1)
SODIUM: 138 mmol/L (ref 135–145)
TCO2: 22 mmol/L (ref 0–100)

## 2015-08-14 LAB — PROTIME-INR
INR: 1.08 (ref 0.00–1.49)
PROTHROMBIN TIME: 14.2 s (ref 11.6–15.2)

## 2015-08-14 SURGERY — IVC FILTER REMOVAL

## 2015-08-14 MED ORDER — GUAIFENESIN-DM 100-10 MG/5ML PO SYRP: 15.0000 mL | ORAL_SOLUTION | ORAL | Status: DC | PRN

## 2015-08-14 MED ORDER — DOCUSATE SODIUM 100 MG PO CAPS: 100.0000 mg | ORAL_CAPSULE | Freq: Every day | ORAL | Status: DC

## 2015-08-14 MED ORDER — LABETALOL HCL 5 MG/ML IV SOLN
10.0000 mg | INTRAVENOUS | Status: DC | PRN
Start: 2015-08-14 — End: 2015-08-14

## 2015-08-14 MED ORDER — LIDOCAINE HCL (PF) 1 % IJ SOLN
INTRAMUSCULAR | Status: AC
  Filled 2015-08-14: qty 30

## 2015-08-14 MED ORDER — FENTANYL CITRATE (PF) 100 MCG/2ML IJ SOLN
INTRAMUSCULAR | Status: AC
  Filled 2015-08-14: qty 2

## 2015-08-14 MED ORDER — METOPROLOL TARTRATE 1 MG/ML IV SOLN
2.0000 mg | INTRAVENOUS | Status: DC | PRN
Start: 2015-08-14 — End: 2015-08-14

## 2015-08-14 MED ORDER — ACETAMINOPHEN 325 MG RE SUPP: 325.0000 mg | RECTAL | Status: DC | PRN

## 2015-08-14 MED ORDER — OXYCODONE HCL 5 MG PO TABS: 5.0000 mg | ORAL_TABLET | ORAL | Status: DC | PRN

## 2015-08-14 MED ORDER — HEPARIN (PORCINE) IN NACL 2-0.9 UNIT/ML-% IJ SOLN
INTRAMUSCULAR | Status: AC
  Filled 2015-08-14: qty 1000

## 2015-08-14 MED ORDER — SODIUM CHLORIDE 0.9 % IV SOLN: INTRAVENOUS | Status: DC

## 2015-08-14 MED ORDER — SODIUM CHLORIDE 0.9 % IV SOLN: 1.0000 mL/kg/h | INTRAVENOUS | Status: DC

## 2015-08-14 MED ORDER — MIDAZOLAM HCL 2 MG/2ML IJ SOLN
INTRAMUSCULAR | Status: DC | PRN
  Administered 2015-08-14 (×2): 1 mg via INTRAVENOUS

## 2015-08-14 MED ORDER — ONDANSETRON HCL 4 MG/2ML IJ SOLN: 4.0000 mg | Freq: Four times a day (QID) | INTRAMUSCULAR | Status: DC | PRN

## 2015-08-14 MED ORDER — PHENOL 1.4 % MT LIQD: 1.0000 | OROMUCOSAL | Status: DC | PRN

## 2015-08-14 MED ORDER — IODIXANOL 320 MG/ML IV SOLN
INTRAVENOUS | Status: DC | PRN
  Administered 2015-08-14: 78 mL via INTRAVENOUS

## 2015-08-14 MED ORDER — FENTANYL CITRATE (PF) 100 MCG/2ML IJ SOLN
INTRAMUSCULAR | Status: DC | PRN
  Administered 2015-08-14 (×2): 25 ug via INTRAVENOUS

## 2015-08-14 MED ORDER — ACETAMINOPHEN 325 MG PO TABS: 325.0000 mg | ORAL_TABLET | ORAL | Status: DC | PRN

## 2015-08-14 MED ORDER — HYDRALAZINE HCL 20 MG/ML IJ SOLN
5.0000 mg | INTRAMUSCULAR | Status: DC | PRN
Start: 2015-08-14 — End: 2015-08-14

## 2015-08-14 MED ORDER — ALUM & MAG HYDROXIDE-SIMETH 200-200-20 MG/5ML PO SUSP: 15.0000 mL | ORAL | Status: DC | PRN

## 2015-08-14 MED ORDER — MIDAZOLAM HCL 2 MG/2ML IJ SOLN
INTRAMUSCULAR | Status: AC
  Filled 2015-08-14: qty 2

## 2015-08-14 SURGICAL SUPPLY — 13 items
CATH OMNI FLUSH 5F 65CM (CATHETERS) ×2 IMPLANT
DEVICE ENSNARE  12MMX20MM (VASCULAR PRODUCTS) ×1
DEVICE ENSNARE 12MMX20MM (VASCULAR PRODUCTS) ×1 IMPLANT
KIT MICROINTRODUCER STIFF 5F (SHEATH) ×2 IMPLANT
KIT PV (KITS) ×2 IMPLANT
SHEATH PINNACLE 7F 10CM (SHEATH) IMPLANT
SHEATH PINNACLE MP 7F 45CM (SHEATH) ×2 IMPLANT
STOPCOCK MORSE 400PSI 3WAY (MISCELLANEOUS) ×2 IMPLANT
SYR MEDRAD MARK V 150ML (SYRINGE) ×2 IMPLANT
TRANSDUCER W/STOPCOCK (MISCELLANEOUS) ×2 IMPLANT
TRAY PV CATH (CUSTOM PROCEDURE TRAY) ×2 IMPLANT
TUBING HIGH PRESSURE 120CM (CONNECTOR) ×2 IMPLANT
WIRE BENTSON .035X145CM (WIRE) ×2 IMPLANT

## 2015-08-14 NOTE — Op Note (Signed)
    Patient name: LENUS DEMATTEO MRN: [id number] DOB: 20-Nov-1960 Sex: male  08/14/2015 Pre-operative Diagnosis: IVC filter Post-operative diagnosis:  Same Surgeon:  [name] Procedure Performed:  1.  Ultrasound-guided access, right internal jugular vein  2.  Inferior venacavogram  3.  Removal of foreign body (inferior vena cava filter)    Indications:  The patient had a IVC filter placed preoperatively for orthopedic surgery.  He was scheduled to have it removed approximately 3 months ago, however on venacavogram there was thrombus within the filter and therefore I elected to continue with anticoagulation for 3 months.  He had a CT scan which did not show any thrombus he is here today for possible removal of his filter  Procedure:  The patient was identified in the holding area and taken to room 8.  The patient was then placed supine on the table and prepped and draped in the usual sterile fashion.  A time out was called.  Ultrasound was used to evaluate the right internal jugular vein.  It was widely patent and easily compressible.  A digital ultrasound image was acquired.  1% lidocaine was used for local anesthesia.  An 11 blade was used to make a skin nick.  Under ultrasound guidance, the right internal jugular vein was cannulated with a micropuncture needle.  An 014 wire was advanced without resistance, followed by placement of a micropuncture sheath.  A Benson wire was then advanced into the inferior vena cava followed by a 5 French Omni Flush catheter.  A venacavogram was then performed  Findings:   Venacavogram:  No obvious thrombus was seen within the vena cava filter  Intervention:  After determining that there was no significant thrombus burden within the filter, I elected to proceed with removal.  Over a Benson wire, a 7 French sheath was inserted.  I then placed a 20 mm EnSnare.  The filter was easily grasped with the snare.  The snare was then used to secure the look on the  filter.  The sheath was then advanced down over top of the catheter and the filter was removed.  I did have to give a small tug to get the filter out.  A final venogram was performed which showed no complications.  Impression:  #1  successful removal of IVC filter    V. [name], M.D. [facility] Office: [phone] Pager:  [phone]

## 2015-08-14 NOTE — Discharge Instructions (Signed)
Incision Care An incision is when a surgeon cuts into your body. After surgery, the incision needs to be cared for properly to prevent infection.  HOW TO CARE FOR YOUR INCISION  Take medicines only as directed by your health care provider.  Do not take baths, swim, or use a hot tub until your health care provider approves. You may shower as directed by your health care provider.  Resume your normal diet and activities as directed.  Use anti-itch medicine (such as an antihistamine) as directed by your health care provider. The incision may itch while it is healing. Do not pick or scratch at the incision.  Drink enough fluid to keep your urine clear or pale yellow. SEEK MEDICAL CARE IF:   You have drainage, redness, swelling, or pain at your incision site.  You have muscle aches, chills, or a general ill feeling.  You notice a bad smell coming from the incision or dressing.  Your incision edges separate after the sutures, staples, or skin adhesive strips have been removed.  You have persistent nausea or vomiting.  You have a fever.  You are dizzy. SEEK IMMEDIATE MEDICAL CARE IF:   You have a rash.  You faint.  You have difficulty breathing. MAKE SURE YOU:   Understand these instructions.  Will watch your condition.  Will get help right away if you are not doing well or get worse.   This information is not intended to replace advice given to you by your health care provider. Make sure you discuss any questions you have with your health care provider.   Document Released: 02/21/2005 Document Revised: 08/25/2014 Document Reviewed: 09/28/2013 Elsevier Interactive Patient Education [employer].

## 2015-08-14 NOTE — H&P (View-Only) (Signed)
Patient name: Willie Howe MRN: [id number] DOB: [birth year] Sex: male     Chief Complaint  Patient presents with  . Re-evaluation    3 month f/u w/ CT prior - eval IVC filter    HISTORY OF PRESENT ILLNESS: The patient had a preoperative IVC filter placed for orthopedic surgery.  He has been maintained on Coumadin.  Prior to attempted filter removal 3 months ago, he had a venous duplex which was negative for DVT.  At the time of his venogram, thrombus was seen within the filter.  I elected to observe him for another 3 months and have them brought back with a CT scan.  He has no complaints at this time.  Past Medical History  Diagnosis Date  . Thyroid disease     Graves Disease  . Previous back surgery     x 3  . Dysrhythmia   . Anginal pain ([place])   . DVT (deep venous thrombosis) ([place])   . History of pulmonary embolism   . Graves disease 1996  . Arthritis   . History of kidney stones     Past Surgical History  Procedure Laterality Date  . Knee surgery      x 2  . Nephrolithotomy    . Back surgery    . Colonoscopy    . Lumbar wound debridement N/A 02/23/2014    Procedure: LUMBAR WOUND DEBRIDEMENT;  Surgeon: Faythe Ghee, MD;  Location: [facility];  Service: Neurosurgery;  Laterality: N/A;  exploration lumbar wound. repair of dural defect.  Marland Kitchen Spine surgery  2015    Dr Hal Neer Fusion   . Spine surgery  1999    discectomy  . Knee surgery Left 1976    ?ligament repair   . Menisectomy Right     open medial   . Knee arthroscopy Right 2006    meniscus   . Peripheral vascular catheterization N/A 01/30/2015    Procedure: IVC Filter Insertion;  Surgeon: Serafina Mitchell, MD;  Location: [facility];  Service: Cardiovascular;  Laterality: N/A;  . Ivc filter    . Total knee arthroplasty Right 02/02/2015    Procedure:  RIGHT TOTAL KNEE ARTHROPLASTY;  Surgeon: Carole Civil, MD;  Location: [facility];  Service: Orthopedics;  Laterality: Right;  LM with pt's daughter of new  arrival time (10:45)    . Exam under anesthesia with manipulation of knee Right 04/18/2015    Procedure: MANIPULATION OF RIGHT KNEE UNDER ANESTHESIA;  Surgeon: Carole Civil, MD;  Location: [facility];  Service: Orthopedics;  Laterality: Right;  . Peripheral vascular catheterization N/A 04/24/2015    Procedure: IVC Filter Removal;  Surgeon: Serafina Mitchell, MD;  Location: [facility];  Service: Cardiovascular;  Laterality: N/A;    Social History   Social History  . Marital Status: Married    Spouse Name: N/A  . Number of Children: N/A  . Years of Education: N/A   Occupational History  . Not on file.   Social History Main Topics  . Smoking status: Former Smoker -- 1.50 packs/day for 20 years    Types: Cigarettes    Quit date: 02/03/1995  . Smokeless tobacco: Never Used  . Alcohol Use: No  . Drug Use: No  . Sexual Activity: Yes   Other Topics Concern  . Not on file   Social History Narrative    Family History  Problem Relation Age of Onset  . CAD Sister   .  Deep vein thrombosis Mother   . Hypertension Mother   . Hypertension Father     Allergies as of 07/30/2015  . (No Known Allergies)    Current Outpatient Prescriptions on File Prior to Visit  Medication Sig Dispense Refill  . folic acid (FOLVITE) 1 MG tablet Take 1 mg by mouth daily.    . methotrexate (RHEUMATREX) 2.5 MG tablet Take 20 mg by mouth once a week. 8 tablets on Friday. Caution:Chemotherapy. Protect from light.    . predniSONE (DELTASONE) 5 MG tablet Take 5 mg by mouth daily.     Marland Kitchen senna (SENOKOT) 8.6 MG TABS tablet Take 1 tablet (8.6 mg total) by mouth 2 (two) times daily. 120 each 0  . WARFARIN SODIUM PO Take by mouth.     No current facility-administered medications on file prior to visit.     REVIEW OF SYSTEMS: Cardiovascular: No chest pain, chest pressure. Pulmonary: No productive cough, asthma or wheezing. Neurologic: No dizziness. Hematologic: No bleeding problems or clotting  disorders. Musculoskeletal: No joint pain or joint swelling. Gastrointestinal: No blood in stool or hematemesis Genitourinary: No dysuria or hematuria. Psychiatric:: No history of major depression. Integumentary: No rashes or ulcers. Constitutional: No fever or chills.  PHYSICAL EXAMINATION:   Vital signs are  Filed Vitals:   07/30/15 1513  BP: 123/76  Pulse: 86  Height: 6\' 3"  (1.905 m)  Weight: 282 lb (127.914 kg)  SpO2: 98%   Body mass index is 35.25 kg/(m^2). General: The patient appears their stated age. HEENT:  No gross abnormalities Pulmonary:  Non labored breathing Musculoskeletal: There are no major deformities. Neurologic: No focal weakness or paresthesias are detected, Skin: There are no ulcer or rashes noted. Psychiatric: The patient has normal affect.    Diagnostic Studies I have reviewed his CT scan.  There is no definitive evidence for thrombus within the IVC filter  Assessment: Status post IVC filter placement Plan: Based on the CT scan, I do not see any further thrombus within the filter.  Therefore I have recommended proceeding with filter removal.  This is been scheduled for Wednesday, December 27.  I have asked that his last Coumadin dose be the morning of December 23.    Eldridge Abrahams, M.D. [facility] Office: [phone] Pager:  [phone]

## 2015-08-14 NOTE — Interval H&P Note (Signed)
History and Physical Interval Note:  08/14/2015 10:33 AM  [name]  has presented today for surgery, with the diagnosis of completion of therpy  The various methods of treatment have been discussed with the patient and family. After consideration of risks, benefits and other options for treatment, the patient has consented to  Procedure(s): IVC Filter Removal (N/A) as a surgical intervention .  The patient's history has been reviewed, patient examined, no change in status, stable for surgery.  I have reviewed the patient's chart and labs.  Questions were answered to the patient's satisfaction.     Willie Howe

## 2015-08-15 ENCOUNTER — Encounter (HOSPITAL_COMMUNITY): Payer: Self-pay | Admitting: Surgery

## 2015-08-15 MED FILL — Heparin Sodium (Porcine) 2 Unit/ML in Sodium Chloride 0.9%: INTRAMUSCULAR | Qty: 1000 | Status: AC

## 2015-08-15 MED FILL — Lidocaine HCl Local Preservative Free (PF) Inj 1%: INTRAMUSCULAR | Qty: 30 | Status: AC

## 2015-09-10 ENCOUNTER — Ambulatory Visit (INDEPENDENT_AMBULATORY_CARE_PROVIDER_SITE_OTHER): Payer: BLUE CROSS/BLUE SHIELD | Admitting: Neurology

## 2015-09-10 ENCOUNTER — Encounter: Payer: Self-pay | Admitting: Neurology

## 2015-09-10 VITALS — BP 129/79 | HR 82 | Resp 18 | Ht 75.0 in | Wt 280.0 lb

## 2015-09-10 DIAGNOSIS — E669 Obesity, unspecified: Secondary | ICD-10-CM | POA: Diagnosis not present

## 2015-09-10 DIAGNOSIS — R0683 Snoring: Secondary | ICD-10-CM

## 2015-09-10 DIAGNOSIS — R0681 Apnea, not elsewhere classified: Secondary | ICD-10-CM | POA: Diagnosis not present

## 2015-09-10 DIAGNOSIS — R51 Headache: Secondary | ICD-10-CM

## 2015-09-10 DIAGNOSIS — R519 Headache, unspecified: Secondary | ICD-10-CM

## 2015-09-10 DIAGNOSIS — R351 Nocturia: Secondary | ICD-10-CM

## 2015-09-10 DIAGNOSIS — F1190 Opioid use, unspecified, uncomplicated: Secondary | ICD-10-CM

## 2015-09-10 DIAGNOSIS — R4189 Other symptoms and signs involving cognitive functions and awareness: Secondary | ICD-10-CM

## 2015-09-10 NOTE — Patient Instructions (Signed)
Based on your symptoms and your exam I believe you are at risk for obstructive sleep apnea or OSA, and I think we should proceed with a sleep study to determine whether you do or do not have OSA and how severe it is. If you have more than mild OSA, I want you to consider treatment with CPAP. Please remember, the risks and ramifications of moderate to severe obstructive sleep apnea or OSA are: Cardiovascular disease, including congestive heart failure, stroke, difficult to control hypertension, arrhythmias, and even type 2 diabetes has been linked to untreated OSA. Sleep apnea causes disruption of sleep and sleep deprivation in most cases, which, in turn, can cause recurrent headaches, problems with memory, mood, concentration, focus, and vigilance. Most people with untreated sleep apnea report excessive daytime sleepiness, which can affect their ability to drive. Please do not drive if you feel sleepy.   I will likely see you back after your sleep study to go over the test results and where to go from there. We will call you after your sleep study to advise about the results (most likely, you will hear from Beverlee Nims, my nurse) and to set up an appointment at the time, as necessary.    Our sleep lab administrative assistant, Arrie Aran will meet with you or call you to schedule your sleep study. If you don't hear back from her by next week please feel free to call her at [phone]. This is her direct line and please leave a message with your phone number to call back if you get the voicemail box. She will call back as soon as possible.   You may have a condition called REM behavior disorder (RBD). This means, that you have a tendency to act out your drains in your sleep. The frequency of this problem is highly variable and may range from night to night episodes to going days and weeks without any problems. This condition may result in sleep disruption, and therefore daytime symptoms such as lack of energy, problems  focusing and concentrating and daytime sleepiness. It may result in self injury if you flail your arms and legs and even roll out of bed but also injury to your bed partner if you accidentally grab, or punch or hit your bed partner. Making your sleep environment safe for you and your bed partner is important. We do not actually know what causes this condition. It can be at times linked to either neurological conditions, including neuro degenerative diseases such as Parkinson's disease (PD). We do not understand fully how RBD and Parkinson's disease or related and why RBD may occur at times many years before Parkinson's disease develops and some cases. Having RBD certainly does not mean he will go on to develop Parkinson's disease. Nevertheless, there may be up to 40% correlation between RBD and PD.    Some of your dream enactment issues may be due to your pain medication.
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
## 2015-09-17 ENCOUNTER — Ambulatory Visit (INDEPENDENT_AMBULATORY_CARE_PROVIDER_SITE_OTHER): Payer: BLUE CROSS/BLUE SHIELD | Admitting: Neurology

## 2015-09-17 DIAGNOSIS — G4733 Obstructive sleep apnea (adult) (pediatric): Secondary | ICD-10-CM

## 2015-09-17 DIAGNOSIS — G4720 Circadian rhythm sleep disorder, unspecified type: Secondary | ICD-10-CM

## 2015-09-18 NOTE — Sleep Study (Signed)
Please see the scanned sleep study interpretation located in the procedure tab within the chart review section.   

## 2015-09-21 ENCOUNTER — Telehealth: Payer: Self-pay | Admitting: Neurology

## 2015-09-21 DIAGNOSIS — G4733 Obstructive sleep apnea (adult) (pediatric): Secondary | ICD-10-CM

## 2015-09-21 DIAGNOSIS — G4720 Circadian rhythm sleep disorder, unspecified type: Secondary | ICD-10-CM

## 2015-09-21 NOTE — Telephone Encounter (Signed)
Patient referred by Ms. Couillard, seen by me on 1/231/7, diagnostic PSG on 09/17/15, ins: [employer].   Please call and notify the patient that the recent sleep study did not show any dream enactments, but did show obstructive sleep apnea and that I recommend treatment for this in the form of CPAP. This will require a repeat sleep study for proper titration and mask fitting. Please explain to patient and arrange for a CPAP titration study. I have placed an order in the chart. Thanks, and please route to [facility] for scheduling next sleep study.  Star Age, MD, PhD [facility])

## 2015-09-25 NOTE — Telephone Encounter (Signed)
I spoke to patient and he is willing to proceed with titration study. I will send report to PCP.

## 2015-10-01 ENCOUNTER — Ambulatory Visit (INDEPENDENT_AMBULATORY_CARE_PROVIDER_SITE_OTHER): Payer: BLUE CROSS/BLUE SHIELD | Admitting: Neurology

## 2015-10-01 DIAGNOSIS — G4720 Circadian rhythm sleep disorder, unspecified type: Secondary | ICD-10-CM

## 2015-10-01 DIAGNOSIS — G4733 Obstructive sleep apnea (adult) (pediatric): Secondary | ICD-10-CM

## 2015-10-01 DIAGNOSIS — G479 Sleep disorder, unspecified: Secondary | ICD-10-CM

## 2015-10-01 NOTE — Sleep Study (Signed)
Please see the scanned sleep study interpretation located in the Procedure tab within the Chart Review section. 

## 2015-10-03 ENCOUNTER — Telehealth: Payer: Self-pay | Admitting: Neurology

## 2015-10-03 DIAGNOSIS — G4733 Obstructive sleep apnea (adult) (pediatric): Secondary | ICD-10-CM

## 2015-10-03 NOTE — Telephone Encounter (Signed)
I spoke to patient and he is aware of results and recommendations. He is willing to proceed with treatment. I will send referral to DME company. I will send report to PCP. Patient will get a letter reminding him to make f/u appt and stress the importance of compliance.

## 2015-10-03 NOTE — Telephone Encounter (Signed)
Patient referred by Ms. Couillard, seen by me on 1/231/7, diagnostic PSG on 09/17/15, CPAP study on 10/01/15, ins: [employer].  Please call and inform patient that I have entered an order for treatment with positive airway pressure (PAP) treatment of obstructive sleep apnea (OSA). He did fairly well during the latest sleep study with CPAP. We will, therefore, arrange for a machine for home use through a DME (durable medical equipment) company of His choice; and I will see the patient back in follow-up in about 8-10 weeks. Please also explain to the patient that I will be looking out for compliance data, which can be downloaded from the machine (stored on an SD card, that is inserted in the machine) or via remote access through a modem, that is built into the machine. At the time of the followup appointment we will discuss sleep study results and how it is going with PAP treatment at home. Please advise patient to bring His machine at the time of the first FU visit, even though this is cumbersome. Bringing the machine for every visit after that will likely not be needed, but often helps for the first visit to troubleshoot if needed. Please re-enforce the importance of compliance with treatment and the need for [place] to monitor compliance data - often an insurance requirement and actually good feedback for the patient as far as how they are doing.  Also remind patient, that any interim PAP machine or mask issues should be first addressed with the DME company, as they can often help better with technical and mask fit issues. Please ask if patient has a preference regarding DME company.  Please also make sure, the patient has a follow-up appointment with me in about 8-10 weeks from the setup date, thanks.  Once you have spoken to the patient - and faxed/routed report to PCP and referring MD (if other than PCP), you can close this encounter, thanks,   Star Age, MD, PhD [facility] ([place])

## 2015-10-16 ENCOUNTER — Ambulatory Visit (INDEPENDENT_AMBULATORY_CARE_PROVIDER_SITE_OTHER): Payer: Self-pay | Admitting: Orthopedic Surgery

## 2015-10-16 ENCOUNTER — Encounter: Payer: Self-pay | Admitting: Orthopedic Surgery

## 2015-10-16 VITALS — BP 119/75 | Ht 75.0 in | Wt 281.0 lb

## 2015-10-16 DIAGNOSIS — M25661 Stiffness of right knee, not elsewhere classified: Secondary | ICD-10-CM

## 2015-10-16 DIAGNOSIS — Z96651 Presence of right artificial knee joint: Secondary | ICD-10-CM

## 2015-10-16 NOTE — Progress Notes (Signed)
Patient ID: Willie Howe, male   DOB: [birth year], 55 y.o.   MRN: [id number]  Chief Complaint  Patient presents with  . Follow-up    3 month follow up Rt TKA, DOS 02/02/15   History  RT TKA 6.17.16 RT MUA 04-18-15 Pain well controlled with improvement with surgery  Postoperative course was complicated by warfarin therapy for second clot with history of prior pulmonary embolus need for [place] filter  Review of systems otherwise doing well with no back pain no fever no chills  BP 119/75 mmHg  Ht 6\' 3"  (1.905 m)  Wt 281 lb (127.461 kg)  BMI 35.12 kg/m2 He is awake alert and oriented 3 mood and affect are normal he is ambulatory with a very very slight barely noticeable limp  His incision is clean dry and intact he has no effusion has mild soft tissue tenderness medial and lateral joint lines knee is stable his quadriceps strength is normal  Status post manipulation of right knee. Patient complains of tightness and stiffness. He is getting about 95-100 of knee flexion and has a slight flexion contracture  His annual surgery date will be June and we will take x-rays at that time    ASSESSMENT AND PLAN   S/P total knee and manipulation right knee with residual stiffness in his right   Continue exercises. Follow-up in June for x-ray right knee for annual film

## 2015-10-29 DIAGNOSIS — R972 Elevated prostate specific antigen [PSA]: Secondary | ICD-10-CM | POA: Insufficient documentation

## 2015-10-29 DIAGNOSIS — H905 Unspecified sensorineural hearing loss: Secondary | ICD-10-CM | POA: Insufficient documentation

## 2015-10-29 DIAGNOSIS — H8110 Benign paroxysmal vertigo, unspecified ear: Secondary | ICD-10-CM | POA: Insufficient documentation

## 2015-10-29 DIAGNOSIS — Z9229 Personal history of other drug therapy: Secondary | ICD-10-CM | POA: Insufficient documentation

## 2015-10-29 DIAGNOSIS — E119 Type 2 diabetes mellitus without complications: Secondary | ICD-10-CM | POA: Insufficient documentation

## 2015-10-29 DIAGNOSIS — G4730 Sleep apnea, unspecified: Secondary | ICD-10-CM | POA: Insufficient documentation

## 2015-10-29 DIAGNOSIS — G8929 Other chronic pain: Secondary | ICD-10-CM | POA: Insufficient documentation

## 2015-11-04 DIAGNOSIS — F1411 Cocaine abuse, in remission: Secondary | ICD-10-CM | POA: Insufficient documentation

## 2015-11-04 DIAGNOSIS — F1111 Opioid abuse, in remission: Secondary | ICD-10-CM | POA: Insufficient documentation

## 2015-11-04 DIAGNOSIS — Z86718 Personal history of other venous thrombosis and embolism: Secondary | ICD-10-CM | POA: Insufficient documentation

## 2015-11-04 DIAGNOSIS — G4750 Parasomnia, unspecified: Secondary | ICD-10-CM | POA: Insufficient documentation

## 2015-11-04 DIAGNOSIS — E669 Obesity, unspecified: Secondary | ICD-10-CM | POA: Insufficient documentation

## 2015-11-08 DIAGNOSIS — E7800 Pure hypercholesterolemia, unspecified: Secondary | ICD-10-CM | POA: Insufficient documentation

## 2015-11-21 ENCOUNTER — Telehealth: Payer: Self-pay | Admitting: Neurology

## 2015-11-21 NOTE — Telephone Encounter (Signed)
Patient called to schedule 30-60 day CPAP follow up, patient states he started CPAP machine therapy March 6th. Would need appointment by May 5th. First availability is May 15th. Please call patient to move this appointment up.

## 2015-11-21 NOTE — Telephone Encounter (Signed)
I spoke to patient and was able to fit him into schedule.

## 2015-12-12 ENCOUNTER — Ambulatory Visit (INDEPENDENT_AMBULATORY_CARE_PROVIDER_SITE_OTHER): Payer: BLUE CROSS/BLUE SHIELD | Admitting: Neurology

## 2015-12-12 ENCOUNTER — Encounter: Payer: Self-pay | Admitting: Neurology

## 2015-12-12 VITALS — BP 125/81 | HR 75 | Resp 18 | Ht 75.0 in | Wt 279.0 lb

## 2015-12-12 DIAGNOSIS — G4733 Obstructive sleep apnea (adult) (pediatric): Secondary | ICD-10-CM

## 2015-12-12 DIAGNOSIS — Z9989 Dependence on other enabling machines and devices: Secondary | ICD-10-CM

## 2015-12-12 NOTE — Patient Instructions (Addendum)
Please continue using your CPAP regularly. While your insurance requires that you use CPAP at least 4 hours each night on 70% of the nights, I recommend, that you not skip any nights and use it throughout the night if you can. Getting used to CPAP and staying with the treatment long term does take time and patience and discipline. Untreated obstructive sleep apnea when it is moderate to severe can have an adverse impact on cardiovascular health and raise her risk for heart disease, arrhythmias, hypertension, congestive heart failure, stroke and diabetes. Untreated obstructive sleep apnea causes sleep disruption, nonrestorative sleep, and sleep deprivation. This can have an impact on your day to day functioning and cause daytime sleepiness and impairment of cognitive function, memory loss, mood disturbance, and problems focussing. Using CPAP regularly can improve these symptoms.  Ask your DME provider, [place] about trying the [facility] Full face mask, when you can.  Keep up the good work! I will see you back in 6 months for sleep apnea check up, and if you continue to do well on CPAP I will see you once a year thereafter.
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
## 2015-12-31 ENCOUNTER — Ambulatory Visit: Payer: BLUE CROSS/BLUE SHIELD | Admitting: Neurology

## 2016-01-10 IMAGING — RF DG LUMBAR SPINE 2-3V
1 series · 2 of 2 positions shown · non-contrast
Comparison: CT 07/22/2013

CLINICAL DATA: lumbar fusion

EXAM:
LUMBAR SPINE - 2-3 VIEW; DG C-ARM 61-120 MIN

[Series 1: run · 2 of 2 slices shown]
[im 1/2]
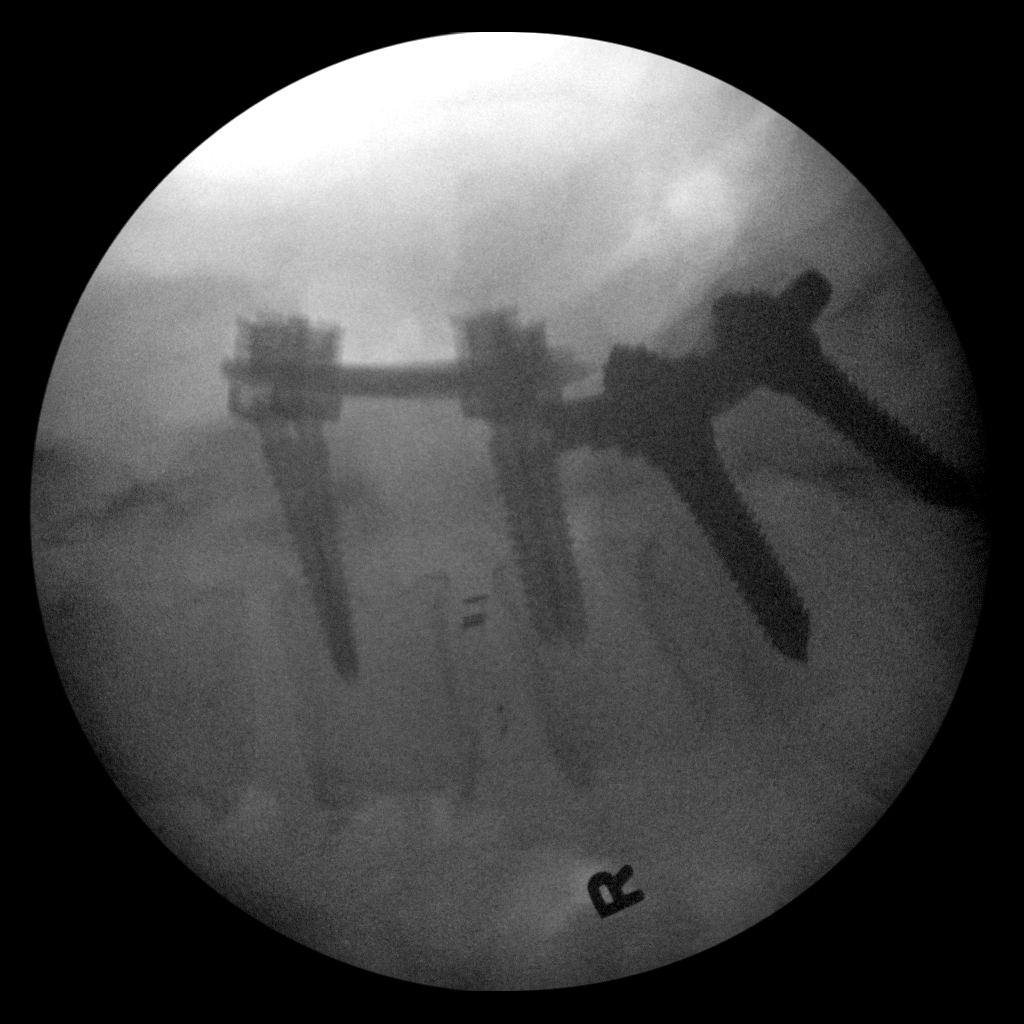
[im 2/2]
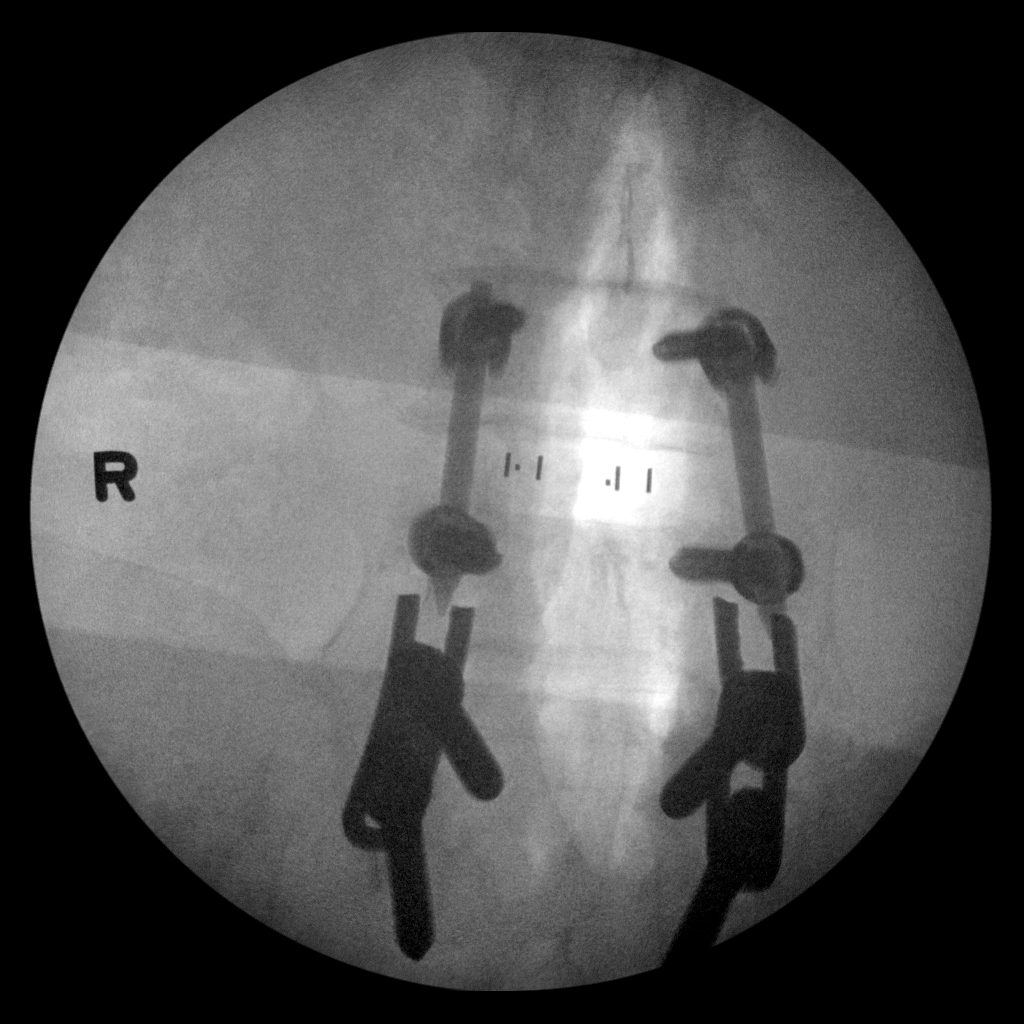

[2 of 2 positions shown; findings below may reference images not displayed]

FINDINGS: Two intraoperative fluoroscopic spot images document extension of
PLIF across the L3-4 interspace. Bilateral pedicle screws and
vertical interconnecting rods. Graft markers in the interspace.
Stable hardware at L5 and S1.
IMPRESSION: 1. Extension of PLIF across L3-4.

## 2016-01-28 ENCOUNTER — Ambulatory Visit (INDEPENDENT_AMBULATORY_CARE_PROVIDER_SITE_OTHER): Payer: BLUE CROSS/BLUE SHIELD

## 2016-01-28 ENCOUNTER — Ambulatory Visit (INDEPENDENT_AMBULATORY_CARE_PROVIDER_SITE_OTHER): Payer: BLUE CROSS/BLUE SHIELD | Admitting: Orthopedic Surgery

## 2016-01-28 VITALS — BP 127/81 | Ht 75.0 in | Wt 272.0 lb

## 2016-01-28 DIAGNOSIS — S8992XA Unspecified injury of left lower leg, initial encounter: Secondary | ICD-10-CM | POA: Diagnosis not present

## 2016-01-28 DIAGNOSIS — M25562 Pain in left knee: Secondary | ICD-10-CM

## 2016-01-28 NOTE — Patient Instructions (Signed)
MRI OF LEFT KNEE ORDERED, WE WILL CALL WITH APPT AND FOLLOW UP

## 2016-01-28 NOTE — Progress Notes (Signed)
Patient ID: Willie Howe, male   DOB: 12-12-1960, 55 y.o.   MRN: [id number]   Chief Complaint  Patient presents with  . Knee Pain    Left knee pain, no injury.    Willie Howe is a 56 y.o. male.   HPI 55 years old status post right total knee presents with new onset left knee pain. Patient stepped on possible small step had previous patellar tendon repair felt a pop in the knee medial posterior pain radiated proximally and then was associated with knee effusion and trouble weightbearing. He received Tylenol and hydrocodone for pain prednisone and an injection in the knee with did not get rid of his pain  Currently we note review of systems to have generalized arthritic joint pain swollen joints back pain muscle weakness limb pain weakness and burning pain in his legs constipation ankle and leg edema and some trouble with his vision Review of Systems See hpi  Past Medical History  Diagnosis Date  . Thyroid disease     Graves Disease  . Previous back surgery     x 3  . Dysrhythmia   . Anginal pain ([place])   . DVT (deep venous thrombosis) ([place])   . History of pulmonary embolism   . Graves disease 1996  . Arthritis   . History of kidney stones   . Rheumatoid arthritis ([place])   . BPPV (benign paroxysmal positional vertigo)   . Obesity     Past Surgical History  Procedure Laterality Date  . Knee surgery      x 2  . Nephrolithotomy    . Back surgery    . Colonoscopy    . Lumbar wound debridement N/A 02/23/2014    Procedure: LUMBAR WOUND DEBRIDEMENT;  Surgeon: Faythe Ghee, MD;  Location: [facility];  Service: Neurosurgery;  Laterality: N/A;  exploration lumbar wound. repair of dural defect.  Marland Kitchen Spine surgery  2015    Dr Hal Neer Fusion   . Spine surgery  1999    discectomy  . Knee surgery Left 1976    ?ligament repair   . Menisectomy Right     open medial   . Knee arthroscopy Right 2006    meniscus   . Peripheral vascular catheterization N/A 01/30/2015    Procedure: IVC Filter  Insertion;  Surgeon: Serafina Mitchell, MD;  Location: [facility];  Service: Cardiovascular;  Laterality: N/A;  . Ivc filter    . Total knee arthroplasty Right 02/02/2015    Procedure:  RIGHT TOTAL KNEE ARTHROPLASTY;  Surgeon: Carole Civil, MD;  Location: [facility];  Service: Orthopedics;  Laterality: Right;  LM with pt's daughter of new arrival time (10:45)    . Exam under anesthesia with manipulation of knee Right 04/18/2015    Procedure: MANIPULATION OF RIGHT KNEE UNDER ANESTHESIA;  Surgeon: Carole Civil, MD;  Location[facility];  Service: Orthopedics;  Laterality: Right;  . Peripheral vascular catheterization N/A 04/24/2015    Procedure: IVC Filter Removal;  Surgeon: Serafina Mitchell, MD;  Location: [facility];  Service: Cardiovascular;  Laterality: N/A;  . Peripheral vascular catheterization N/A 08/14/2015    Procedure: IVC Filter Removal;  Surgeon: Serafina Mitchell, MD;  Location: [facility];  Service: Cardiovascular;  Laterality: N/A;    No Known Allergies  Current Outpatient Prescriptions  Medication Sig Dispense Refill  . aspirin EC 81 MG tablet Take 81 mg by mouth.    . Docusate Calcium (STOOL  SOFTENER PO) Take by mouth.    . folic acid (FOLVITE) 1 MG tablet Take 1 mg by mouth daily.    Marland Kitchen HYDROcodone-acetaminophen (NORCO) 7.5-325 MG tablet Take 1 tablet by mouth every 4 (four) hours as needed for moderate pain. 112 tablet 0  . inFLIXimab (REMICADE) 100 MG injection Inject into the vein.    Marland Kitchen loratadine (CLARITIN) 10 MG tablet Take 10 mg by mouth daily.    . methotrexate (RHEUMATREX) 2.5 MG tablet Take 20 mg by mouth once a week. 8 tablets on Friday. Caution:Chemotherapy. Protect from light.    . predniSONE (DELTASONE) 5 MG tablet Take 5 mg by mouth daily.     . tamsulosin (FLOMAX) 0.4 MG CAPS capsule Take 0.4 mg by mouth daily.     No current facility-administered medications for this visit.     Physical Exam There were no vitals taken for this  visit. Physical Exam   The patient is well developed well nourished and well groomed.   Orientation to person place and time is normal   Mood is pleasant.  Ambulatory status is remarkable for a limp involving the involved extremity         left Knee examination:  Inspection: Tenderness is noted over the medial joint line   ROM: Is limited by pain with a maximum flexion arc of 95  Stability: Collateral ligaments are stable, the Lachman test and anterior   drawer tests are normal    We do palpate Joint effusion and posterior laxity     Motor exam: Grade 5 motor strength in the quadriceps musculature   Skin: Warm dry and intact over the right leg                       Neuro: normal sensation   Vascular: 2+ DP pulse with normal color and no edema.   Currently the right knee had knee replacement and incision clean no effusion   Assessment and Plan:  IMAGING: I have read and interpret the xrays as follows: Osteoarthritis previous probable patellar tendon repair   Diagnosis and treatment:  PCL INJURY  Osteoarthritis of the knee   Arther Abbott, MD 01/28/2016 1:41 PM

## 2016-01-30 ENCOUNTER — Other Ambulatory Visit: Payer: BLUE CROSS/BLUE SHIELD

## 2016-03-03 ENCOUNTER — Ambulatory Visit (INDEPENDENT_AMBULATORY_CARE_PROVIDER_SITE_OTHER): Payer: BLUE CROSS/BLUE SHIELD | Admitting: Orthopedic Surgery

## 2016-03-03 VITALS — BP 115/73 | HR 94 | Ht 75.0 in | Wt 272.0 lb

## 2016-03-03 DIAGNOSIS — S83242D Other tear of medial meniscus, current injury, left knee, subsequent encounter: Secondary | ICD-10-CM

## 2016-03-03 NOTE — Patient Instructions (Signed)
We will obtain pre-certification from the insurer and call you to schedule the study. The office make an appointment for follow-up

## 2016-03-03 NOTE — Progress Notes (Signed)
Recheck  Chief Complaint  Patient presents with  . Follow-up    4 week recheck on left knee.    55 year old male had a right total knee had a left knee injury after stepping up and felt acute pain had a previous patellar tendon repair in the same knee many years back. Complains of continued medial and posterior pain left knee now with increasing effusion increasing pain and more difficulty weightbearing  Prior treatment included Tylenol hydrocodone on prednisone an injection which didn't improve his pain. We submitted him for MRI of his knee and he did not get approved so is an additional 4 weeks of treatment still with the same symptoms  Review of systems right knee now hurting secondary to increase right leg low transfer  Physical Exam  Constitutional: The patient is oriented to person, place, and time. The patient appears well-developed and well-nourished. No distress.  Cardiovascular: Intact distal pulses.   Neurological: The patient alert and oriented to person, place, and time. The patient exhibits normal muscle tone. Coordination normal.  Skin: Skin is warm and dry. No rash noted. The patient is not diaphoretic. No erythema. No pallor.  Psychiatric: The patient has a normal mood and affect. Her behavior is normal. Judgment and thought content normal.  Gait is ABnormal  He has a severe limp favoring his left knee  He has pain over the medial joint line with large joint effusion is anterior scar which healed without difficulty. The McMurray sign is positive the knee feels stable the range of motion is limited  second of prior surgery in flexion is 110 stable in AP plane and coronal plane. No erythema. Mild peripheral edema. Normal sensation left leg  Right knee anterior incision knee flexion limited by postop arthrofibrosis and 100 but otherwise stable  Prior x-ray showed no acute trauma with osteoarthritis  Impression torn medial meniscus left knee  Recommend MRI left knee

## 2016-03-09 ENCOUNTER — Inpatient Hospital Stay: Admission: RE | Admit: 2016-03-09 | Payer: BLUE CROSS/BLUE SHIELD | Source: Ambulatory Visit

## 2016-03-11 ENCOUNTER — Telehealth: Payer: Self-pay | Admitting: *Deleted

## 2016-03-11 NOTE — Telephone Encounter (Signed)
Received fax form [employer] stating that MRI Left Knee has been denied, after faxing records to be reviewed. You can do a PEER TO PEER by calling [phone] ext [phone]

## 2016-03-12 NOTE — Telephone Encounter (Signed)
Bring him in for preop

## 2016-03-13 NOTE — Telephone Encounter (Signed)
Patient was scheduled for Tuesday 03/25/16

## 2016-03-25 ENCOUNTER — Ambulatory Visit (INDEPENDENT_AMBULATORY_CARE_PROVIDER_SITE_OTHER): Payer: BLUE CROSS/BLUE SHIELD | Admitting: Orthopedic Surgery

## 2016-03-25 ENCOUNTER — Encounter: Payer: Self-pay | Admitting: Orthopedic Surgery

## 2016-03-25 VITALS — BP 131/89 | HR 81 | Ht 75.0 in | Wt 275.0 lb

## 2016-03-25 DIAGNOSIS — S83242D Other tear of medial meniscus, current injury, left knee, subsequent encounter: Secondary | ICD-10-CM | POA: Diagnosis not present

## 2016-03-25 DIAGNOSIS — M25562 Pain in left knee: Secondary | ICD-10-CM | POA: Diagnosis not present

## 2016-03-25 NOTE — Patient Instructions (Signed)
You have decided to proceed with operative arthroscopy of the knee. You have decided not to continue with nonoperative measures such as but not limited to oral medication, weight loss, activity modification, physical therapy, bracing, or injection.  We will perform operative arthroscopy of the knee. Some of the risks associated with arthroscopic surgery of the knee include but are not limited to Bleeding Infection Swelling Stiffness Blood clot Pain  If you're not comfortable with these risks and would like to continue with nonoperative treatment please let Dr. Harrison know prior to your surgery. 

## 2016-03-25 NOTE — Progress Notes (Signed)
Subjective:     Patient ID: Willie Howe, male   DOB: [birth year], 55 y.o.   MRN: [id number]  Chief Complaint  Patient presents with  . Follow-up    PRE OP, Left knee pain  Possible surgery    HPI 54 year old male status post right total knee with history of filter for recurrent clotting. The filter since been removed and he is off all anti-coagulants. He reinjured or injured his left knee we tried to get an MRI it was denied. He still having pain catching locking giving way and swelling in the left knee  Review of Systems No shortness of breath or chest pain at this time  Past Medical History:  Diagnosis Date  . Anginal pain ([place])   . Arthritis   . BPPV (benign paroxysmal positional vertigo)   . DVT (deep venous thrombosis) ([place])   . Dysrhythmia   . Graves disease 1996  . History of kidney stones   . History of pulmonary embolism   . Obesity   . Previous back surgery    x 3  . Rheumatoid arthritis ([place])   . Thyroid disease    Graves Disease       Objective:   Physical Exam  BP 131/89   Pulse 81   Ht 6\' 3"  (1.905 m)   Wt 275 lb (124.7 kg)   BMI 34.37 kg/m  He is awake alert and oriented 3 mood and affect are normal he has an altered gait slightly with a slight left lower extremity left. Skin over the left knee is normal distally has very mild edema in the left leg Left knee swelling, loss of motion, medial joint line tenderness, stability is normal. Medial joint line is tender  Neurovascular exam remains intact in the left leg.  Right leg total knee 0-90 flexion.    Assessment:     Left knee pain torn medial meniscus.      Plan:     Is decided to proceed with surgery on the left knee for arthroscopy with medial meniscectomy  We all decided that he should go on a mini dose Coumadin after surgery for 2 weeks  This procedure has been fully reviewed with the patient and written informed consent has been obtained.

## 2016-03-26 ENCOUNTER — Other Ambulatory Visit: Payer: Self-pay | Admitting: *Deleted

## 2016-04-02 ENCOUNTER — Telehealth: Payer: Self-pay | Admitting: *Deleted

## 2016-04-02 NOTE — Telephone Encounter (Signed)
NO PRECERT REQUIRED FOR CPT [phone] [facility] SCHEDULED FOR 04/24/2016 PER GAIL J WITH [employer]

## 2016-04-14 ENCOUNTER — Telehealth: Payer: Self-pay | Admitting: *Deleted

## 2016-04-14 NOTE — Telephone Encounter (Signed)
REQUEST REFILL ON HYDROCODONE 7.5

## 2016-04-15 ENCOUNTER — Other Ambulatory Visit: Payer: Self-pay | Admitting: *Deleted

## 2016-04-15 MED ORDER — HYDROCODONE-ACETAMINOPHEN 7.5-325 MG PO TABS: 1.0000 | ORAL_TABLET | Freq: Four times a day (QID) | ORAL | 0 refills | Status: DC | PRN

## 2016-04-15 NOTE — Telephone Encounter (Signed)
Approved with change interval from every 4 to every 6 or every 6 to every 81 week

## 2016-04-16 ENCOUNTER — Other Ambulatory Visit: Payer: Self-pay | Admitting: Orthopedic Surgery

## 2016-04-16 MED ORDER — HYDROCODONE-ACETAMINOPHEN 7.5-325 MG PO TABS: 1.0000 | ORAL_TABLET | Freq: Four times a day (QID) | ORAL | 0 refills | Status: DC | PRN

## 2016-04-18 NOTE — Patient Instructions (Signed)
Willie Howe  04/18/2016     @PREFPERIOPPHARMACY @   Your procedure is scheduled on 04/24/2016.  Report to [name] at 8:30 A.M.  Call this number if you have problems the morning of surgery:  [phone]   Remember:  Do not eat food or drink liquids after midnight.  Take these medicines the morning of surgery with A SIP OF WATER Claritin, Methotrexate, Flomax   Do not wear jewelry, make-up or nail polish.  Do not wear lotions, powders, or perfumes, or deoderant.  Do not shave 48 hours prior to surgery.  Men may shave face and neck.  Do not bring valuables to the hospital.  [facility] is not responsible for any belongings or valuables.  Contacts, dentures or bridgework may not be worn into surgery.  Leave your suitcase in the car.  After surgery it may be brought to your room.  For patients admitted to the hospital, discharge time will be determined by your treatment team.  Patients discharged the day of surgery will not be allowed to drive home.    Please read over the following fact sheets that you were given. Surgical Site Infection Prevention and Anesthesia Post-op Instructions     PATIENT INSTRUCTIONS POST-ANESTHESIA  IMMEDIATELY FOLLOWING SURGERY:  Do not drive or operate machinery for the first twenty four hours after surgery.  Do not make any important decisions for twenty four hours after surgery or while taking narcotic pain medications or sedatives.  If you develop intractable nausea and vomiting or a severe headache please notify your doctor immediately.  FOLLOW-UP:  Please make an appointment with your surgeon as instructed. You do not need to follow up with anesthesia unless specifically instructed to do so.  WOUND CARE INSTRUCTIONS (if applicable):  Keep a dry clean dressing on the anesthesia/puncture wound site if there is drainage.  Once the wound has quit draining you may leave it open to air.  Generally you should leave the bandage intact for twenty four  hours unless there is drainage.  If the epidural site drains for more than 36-48 hours please call the anesthesia department.  QUESTIONS?:  Please feel free to call your physician or the hospital operator if you have any questions, and they will be happy to assist you.      Knee Arthroscopy Knee arthroscopy is a surgical procedure that is used to examine the inside of your knee joint and repair any damage. The surgeon puts a small, lighted instrument with a camera on the tip (arthroscope) through a small incision in your knee. The camera sends pictures to a monitor in the operating room. Your surgeon uses those pictures to guide the surgical instruments through other incisions to the area of damage. Knee arthroscopy can be used to treat many types of knee problems. It may be used:  To repair a torn ligament.  To repair or remove damaged tissue.  To remove a fluid-filled sac (cyst) from your knee. LET [facility] CARE PROVIDER KNOW ABOUT:  Any allergies you have.  All medicines you are taking, including vitamins, herbs, eye drops, creams, and over-the-counter medicines.  Previous problems you or members of your family have had with the use of anesthetics.  Any blood disorders you have.  Previous surgeries you have had.  Any medical conditions you may have. RISKS AND COMPLICATIONS Generally, this is a safe procedure. However, problems may occur, including:  Infection.  Bleeding.  Damage to blood vessels, nerves, or structures of your knee.  A blood clot that forms in your leg and travels to your lung.  Failure to relieve symptoms. BEFORE THE PROCEDURE  Ask your health care provider about:  Changing or stopping your regular medicines. This is especially important if you are taking diabetes medicines or blood thinners.  Taking medicines such as aspirin and ibuprofen. These medicines can thin your blood. Do not take these medicines before your procedure if your health care  provider instructs you not to.  Follow your health care provider's instructions about eating or drinking restrictions.  Plan to have someone take you home after the procedure.  If you go home right after the procedure, plan to have someone with you for 24 hours.  Do not drink alcohol unless your health care provider says that you can.  Do not use any tobacco products, including cigarettes, chewing tobacco, or electronic cigarettes unless your health care provider says that you can. If you need help quitting, ask your health care provider.  You may have a physical exam. PROCEDURE  An IV tube will be inserted into one of your veins.  You will be given one or more of the following:  A medicine that helps you relax (sedative).  A medicine that numbs the area (local anesthetic).  A medicine that makes you fall asleep (general anesthetic).  A medicine that is injected into your spine that numbs the area below and slightly above the injection site (spinal anesthetic).  A medicine that is injected into an area of your body that numbs everything below the injection site (regional anesthetic).  A cuff may be placed around your upper leg to slow bleeding during the procedure.  The surgeon will make a small number of incisions around your knee.  Your knee joint will be flushed and filled with a germ-free (sterile) solution.  The arthroscope will be passed through an incision into your knee joint.  More instruments will be passed through other incisions to repair your knee as needed.  The fluid will be removed from your knee.  The incisions will be closed with adhesive strips or stitches (sutures).  A bandage (dressing) will be placed over your knee. The procedure may vary among health care providers and hospitals. AFTER THE PROCEDURE  Your blood pressure, heart rate, breathing rate and blood oxygen level will be monitored often until the medicines you were given have worn  off.  You may be given medicine for pain.  You may get crutches to help you walk without using your knee to support your body weight.  You may have to wear compression stockings. These stocking help to prevent blood clots and reduce swelling in your legs.   This information is not intended to replace advice given to you by your health care provider. Make sure you discuss any questions you have with your health care provider.   Document Released: 08/01/2000 Document Revised: 12/19/2014 Document Reviewed: 07/31/2014 Elsevier Interactive Patient Education [employer].

## 2016-04-22 ENCOUNTER — Encounter (HOSPITAL_COMMUNITY): Payer: Self-pay

## 2016-04-22 ENCOUNTER — Encounter (HOSPITAL_COMMUNITY)
Admission: RE | Admit: 2016-04-22 | Discharge: 2016-04-22 | Disposition: A | Discharge status: Home / Self Care | Payer: BLUE CROSS/BLUE SHIELD | Source: Ambulatory Visit | Attending: Orthopedic Surgery | Admitting: Orthopedic Surgery

## 2016-04-22 ENCOUNTER — Other Ambulatory Visit: Payer: Self-pay

## 2016-04-22 DIAGNOSIS — M25562 Pain in left knee: Secondary | ICD-10-CM | POA: Diagnosis present

## 2016-04-22 DIAGNOSIS — G4730 Sleep apnea, unspecified: Secondary | ICD-10-CM | POA: Diagnosis not present

## 2016-04-22 DIAGNOSIS — Z7952 Long term (current) use of systemic steroids: Secondary | ICD-10-CM | POA: Diagnosis not present

## 2016-04-22 DIAGNOSIS — Z7982 Long term (current) use of aspirin: Secondary | ICD-10-CM | POA: Diagnosis not present

## 2016-04-22 DIAGNOSIS — Z79899 Other long term (current) drug therapy: Secondary | ICD-10-CM | POA: Diagnosis not present

## 2016-04-22 DIAGNOSIS — Z87891 Personal history of nicotine dependence: Secondary | ICD-10-CM | POA: Diagnosis not present

## 2016-04-22 DIAGNOSIS — S83242A Other tear of medial meniscus, current injury, left knee, initial encounter: Secondary | ICD-10-CM | POA: Diagnosis not present

## 2016-04-22 DIAGNOSIS — M1990 Unspecified osteoarthritis, unspecified site: Secondary | ICD-10-CM | POA: Diagnosis not present

## 2016-04-22 DIAGNOSIS — M659 Synovitis and tenosynovitis, unspecified: Secondary | ICD-10-CM | POA: Diagnosis not present

## 2016-04-22 DIAGNOSIS — M069 Rheumatoid arthritis, unspecified: Secondary | ICD-10-CM | POA: Diagnosis not present

## 2016-04-22 DIAGNOSIS — Z86711 Personal history of pulmonary embolism: Secondary | ICD-10-CM | POA: Diagnosis not present

## 2016-04-22 DIAGNOSIS — Z7984 Long term (current) use of oral hypoglycemic drugs: Secondary | ICD-10-CM | POA: Diagnosis not present

## 2016-04-22 DIAGNOSIS — Z86718 Personal history of other venous thrombosis and embolism: Secondary | ICD-10-CM | POA: Diagnosis not present

## 2016-04-22 DIAGNOSIS — X58XXXA Exposure to other specified factors, initial encounter: Secondary | ICD-10-CM | POA: Diagnosis not present

## 2016-04-22 DIAGNOSIS — Z96651 Presence of right artificial knee joint: Secondary | ICD-10-CM | POA: Diagnosis not present

## 2016-04-22 DIAGNOSIS — M1712 Unilateral primary osteoarthritis, left knee: Secondary | ICD-10-CM | POA: Diagnosis not present

## 2016-04-22 HISTORY — DX: Sleep apnea, unspecified: G47.30

## 2016-04-22 LAB — CBC
HEMATOCRIT: 43 % (ref 39.0–52.0)
Hemoglobin: 14.1 g/dL (ref 13.0–17.0)
MCH: 29.5 pg (ref 26.0–34.0)
MCHC: 32.8 g/dL (ref 30.0–36.0)
MCV: 90 fL (ref 78.0–100.0)
PLATELETS: 271 10*3/uL (ref 150–400)
RBC: 4.78 MIL/uL (ref 4.22–5.81)
RDW: 15.9 % — AB (ref 11.5–15.5)
WBC: 7.9 10*3/uL (ref 4.0–10.5)

## 2016-04-22 LAB — BASIC METABOLIC PANEL
ANION GAP: 6 (ref 5–15)
BUN: 14 mg/dL (ref 6–20)
CALCIUM: 9.3 mg/dL (ref 8.9–10.3)
CO2: 25 mmol/L (ref 22–32)
CREATININE: 0.85 mg/dL (ref 0.61–1.24)
Chloride: 106 mmol/L (ref 101–111)
Glucose, Bld: 146 mg/dL — ABNORMAL HIGH (ref 65–99)
Potassium: 3.9 mmol/L (ref 3.5–5.1)
SODIUM: 137 mmol/L (ref 135–145)

## 2016-04-22 NOTE — Progress Notes (Signed)
Patient given instructions with web site and code to sign up for "My Chart"  

## 2016-04-23 NOTE — H&P (Signed)
Patient ID: Willie Howe, male   DOB: Nov 06, 1960, 55 y.o.   MRN: [id number]   Chief Complaint  Patient presents with  . Follow-up      PRE OP, Left knee pain   Possible surgery     HPI 55 year old male status post right total knee with history of filter for recurrent clotting. The filter since been removed and he is off all anti-coagulants. He reinjured or injured his left knee we tried to get an MRI it was denied. He still having pain catching locking giving way and swelling in the left knee.   Review of Systems No shortness of breath or chest pain at this time  Remaining review of systems negative       Past Medical History:  Diagnosis Date  . Anginal pain ([place])    . Arthritis    . BPPV (benign paroxysmal positional vertigo)    . DVT (deep venous thrombosis) ([place])    . Dysrhythmia    . Graves disease 1996  . History of kidney stones    . History of pulmonary embolism    . Obesity    . Previous back surgery      x 3  . Rheumatoid arthritis ([place])    . Thyroid disease      Graves Disease      Past Surgical History:  Procedure Laterality Date  . BACK SURGERY    . COLONOSCOPY    . EXAM UNDER ANESTHESIA WITH MANIPULATION OF KNEE Right 04/18/2015   Procedure: MANIPULATION OF RIGHT KNEE UNDER ANESTHESIA;  Surgeon: Carole Civil, MD;  Location: [facility];  Service: Orthopedics;  Laterality: Right;  . IVC Filter    . KNEE ARTHROSCOPY Right 2006   meniscus   . KNEE SURGERY     x 2  . KNEE SURGERY Left 1976   ?ligament repair   . LUMBAR WOUND DEBRIDEMENT N/A 02/23/2014   Procedure: LUMBAR WOUND DEBRIDEMENT;  Surgeon: Faythe Ghee, MD;  Location: [facility];  Service: Neurosurgery;  Laterality: N/A;  exploration lumbar wound. repair of dural defect.  Marland Kitchen MENISECTOMY Right    open medial   . NEPHROLITHOTOMY    . PERIPHERAL VASCULAR CATHETERIZATION N/A 01/30/2015   Procedure: IVC Filter Insertion;  Surgeon: Serafina Mitchell, MD;  Location: [place] [facility];  Service: Cardiovascular;   Laterality: N/A;  . PERIPHERAL VASCULAR CATHETERIZATION N/A 04/24/2015   Procedure: IVC Filter Removal;  Surgeon: Serafina Mitchell, MD;  Location: [place] [facility];  Service: Cardiovascular;  Laterality: N/A;  . PERIPHERAL VASCULAR CATHETERIZATION N/A 08/14/2015   Procedure: IVC Filter Removal;  Surgeon: Serafina Mitchell, MD;  Location: [place] [facility];  Service: Cardiovascular;  Laterality: N/A;  . SPINE SURGERY  2015   Dr Hal Neer Fusion   . SPINE SURGERY  1999   discectomy  . TOTAL KNEE ARTHROPLASTY Right 02/02/2015   Procedure:  RIGHT TOTAL KNEE ARTHROPLASTY;  Surgeon: Carole Civil, MD;  Location: [facility];  Service: Orthopedics;  Laterality: Right;  LM with pt's daughter of new arrival time (10:45)     No current facility-administered medications for this encounter.   Current Outpatient Prescriptions:  .  aspirin EC 81 MG tablet, Take 81 mg by mouth daily. , Disp: , Rfl:  .  Docusate Calcium (STOOL SOFTENER PO), Take 1 capsule by mouth daily. , Disp: , Rfl:  .  folic acid (FOLVITE) 1 MG tablet, Take 1 mg by mouth daily., Disp: , Rfl:  .  inFLIXimab (REMICADE) 100 MG injection, Inject into the vein., Disp: , Rfl:  .  loratadine (CLARITIN) 10 MG tablet, Take 10 mg by mouth daily as needed for allergies. , Disp: , Rfl:  .  metFORMIN (GLUCOPHAGE) 500 MG tablet, Take by mouth daily with breakfast., Disp: , Rfl:  .  methotrexate (RHEUMATREX) 2.5 MG tablet, Take 20 mg by mouth once a week. 8 tablets on Friday. Caution:Chemotherapy. Protect from light., Disp: , Rfl:  .  predniSONE (DELTASONE) 5 MG tablet, Take 5 mg by mouth daily. , Disp: , Rfl:  .  tamsulosin (FLOMAX) 0.4 MG CAPS capsule, Take 0.4 mg by mouth daily., Disp: , Rfl:  .  HYDROcodone-acetaminophen (NORCO) 7.5-325 MG tablet, Take 1 tablet by mouth every 6 (six) hours as needed for moderate pain., Disp: 56 tablet, Rfl: 0 Social History  Substance Use Topics  . Smoking status: Former Smoker    Packs/day: 1.50    Years: 20.00     Types: Cigarettes    Quit date: 02/03/1995  . Smokeless tobacco: Never Used  . Alcohol use No   Family History  Problem Relation Age of Onset  . Deep vein thrombosis Mother   . Hypertension Mother   . Breast cancer Mother   . Hypertension Father   . Prostate cancer Father   . CAD Sister        Objective:   Physical Exam  BP 131/89   Pulse 81   Ht 6\' 3"  (1.905 m)   Wt 275 lb (124.7 kg)   BMI 34.37 kg/m  He is awake alert and oriented 3 mood and affect are normal he has an altered gait slightly with a slight left lower extremity left. Skin over the left knee is normal distally has very mild edema in the left leg  Left knee swelling, loss of motion, medial joint line tenderness, stability is normal. Medial joint line is tender   Neurovascular exam remains intact in the left leg.   Right leg total knee 0-90 flexion.No tenderness no swelling. Knee is stable and the coronal and sagittal plane. Strength normal. Skin incision healed nicely. Mild peripheral edema. Normal pulses.  Upper extremities normal.    Assessment:     Left knee pain torn medial meniscus.        Plan:     Is decided to proceed with surgery on the left knee for arthroscopy with medial meniscectomy   We all decided that he should go on a mini dose Coumadin after surgery for 2 weeks   This procedure has been fully reviewed with the patient and written informed consent has been obtained.

## 2016-04-24 ENCOUNTER — Encounter (HOSPITAL_COMMUNITY): Payer: Self-pay | Admitting: *Deleted

## 2016-04-24 ENCOUNTER — Encounter (HOSPITAL_COMMUNITY)
Admission: RE | Disposition: A | Discharge status: Home / Self Care | Payer: Self-pay | Source: Ambulatory Visit | Attending: Orthopedic Surgery

## 2016-04-24 ENCOUNTER — Ambulatory Visit (HOSPITAL_COMMUNITY): Payer: BLUE CROSS/BLUE SHIELD | Admitting: Anesthesiology

## 2016-04-24 ENCOUNTER — Ambulatory Visit (HOSPITAL_COMMUNITY)
Admission: RE | Admit: 2016-04-24 | Discharge: 2016-04-24 | Disposition: A | Discharge status: Home / Self Care | Payer: BLUE CROSS/BLUE SHIELD | Source: Ambulatory Visit | Attending: Orthopedic Surgery | Admitting: Orthopedic Surgery

## 2016-04-24 DIAGNOSIS — Z79899 Other long term (current) drug therapy: Secondary | ICD-10-CM | POA: Insufficient documentation

## 2016-04-24 DIAGNOSIS — G4730 Sleep apnea, unspecified: Secondary | ICD-10-CM | POA: Insufficient documentation

## 2016-04-24 DIAGNOSIS — M659 Synovitis and tenosynovitis, unspecified: Secondary | ICD-10-CM | POA: Insufficient documentation

## 2016-04-24 DIAGNOSIS — Z86711 Personal history of pulmonary embolism: Secondary | ICD-10-CM | POA: Insufficient documentation

## 2016-04-24 DIAGNOSIS — S83242D Other tear of medial meniscus, current injury, left knee, subsequent encounter: Secondary | ICD-10-CM

## 2016-04-24 DIAGNOSIS — Z7982 Long term (current) use of aspirin: Secondary | ICD-10-CM | POA: Insufficient documentation

## 2016-04-24 DIAGNOSIS — Z7952 Long term (current) use of systemic steroids: Secondary | ICD-10-CM | POA: Insufficient documentation

## 2016-04-24 DIAGNOSIS — Z86718 Personal history of other venous thrombosis and embolism: Secondary | ICD-10-CM | POA: Insufficient documentation

## 2016-04-24 DIAGNOSIS — Z7984 Long term (current) use of oral hypoglycemic drugs: Secondary | ICD-10-CM | POA: Insufficient documentation

## 2016-04-24 DIAGNOSIS — M1990 Unspecified osteoarthritis, unspecified site: Secondary | ICD-10-CM | POA: Insufficient documentation

## 2016-04-24 DIAGNOSIS — S83249A Other tear of medial meniscus, current injury, unspecified knee, initial encounter: Secondary | ICD-10-CM

## 2016-04-24 DIAGNOSIS — M069 Rheumatoid arthritis, unspecified: Secondary | ICD-10-CM | POA: Insufficient documentation

## 2016-04-24 DIAGNOSIS — M1711 Unilateral primary osteoarthritis, right knee: Secondary | ICD-10-CM

## 2016-04-24 DIAGNOSIS — M1712 Unilateral primary osteoarthritis, left knee: Secondary | ICD-10-CM | POA: Insufficient documentation

## 2016-04-24 DIAGNOSIS — S83242A Other tear of medial meniscus, current injury, left knee, initial encounter: Secondary | ICD-10-CM | POA: Diagnosis not present

## 2016-04-24 DIAGNOSIS — Z96651 Presence of right artificial knee joint: Secondary | ICD-10-CM | POA: Insufficient documentation

## 2016-04-24 DIAGNOSIS — Z87891 Personal history of nicotine dependence: Secondary | ICD-10-CM | POA: Insufficient documentation

## 2016-04-24 DIAGNOSIS — X58XXXA Exposure to other specified factors, initial encounter: Secondary | ICD-10-CM | POA: Insufficient documentation

## 2016-04-24 HISTORY — PX: KNEE ARTHROSCOPY WITH MEDIAL MENISECTOMY: SHX5651

## 2016-04-24 LAB — GLUCOSE, CAPILLARY
Glucose-Capillary: 119 mg/dL — ABNORMAL HIGH (ref 65–99)
Glucose-Capillary: 145 mg/dL — ABNORMAL HIGH (ref 65–99)

## 2016-04-24 SURGERY — ARTHROSCOPY, KNEE, WITH MEDIAL MENISCECTOMY
Anesthesia: General | Site: Knee | Laterality: Left

## 2016-04-24 MED ORDER — OXYCODONE-ACETAMINOPHEN 5-325 MG PO TABS: 1.0000 | ORAL_TABLET | ORAL | 0 refills | Status: DC | PRN

## 2016-04-24 MED ORDER — GLYCOPYRROLATE 0.2 MG/ML IJ SOLN
INTRAMUSCULAR | Status: AC
  Filled 2016-04-24: qty 1

## 2016-04-24 MED ORDER — LACTATED RINGERS IV SOLN
INTRAVENOUS | Status: DC | PRN
  Administered 2016-04-24 (×2): via INTRAVENOUS

## 2016-04-24 MED ORDER — FENTANYL CITRATE (PF) 100 MCG/2ML IJ SOLN
INTRAMUSCULAR | Status: AC
  Filled 2016-04-24: qty 2

## 2016-04-24 MED ORDER — CEFAZOLIN SODIUM-DEXTROSE 2-4 GM/100ML-% IV SOLN
2.0000 g | INTRAVENOUS | Status: AC
  Administered 2016-04-24: 2 g via INTRAVENOUS
  Filled 2016-04-24: qty 100

## 2016-04-24 MED ORDER — SUCCINYLCHOLINE CHLORIDE 20 MG/ML IJ SOLN
INTRAMUSCULAR | Status: AC
  Filled 2016-04-24: qty 2

## 2016-04-24 MED ORDER — FENTANYL CITRATE (PF) 100 MCG/2ML IJ SOLN
INTRAMUSCULAR | Status: DC | PRN
  Administered 2016-04-24 (×2): 25 ug via INTRAVENOUS
  Administered 2016-04-24 (×3): 50 ug via INTRAVENOUS

## 2016-04-24 MED ORDER — ONDANSETRON HCL 4 MG/2ML IJ SOLN
INTRAMUSCULAR | Status: AC
  Filled 2016-04-24: qty 2

## 2016-04-24 MED ORDER — MIDAZOLAM HCL 2 MG/2ML IJ SOLN
1.0000 mg | INTRAMUSCULAR | Status: DC | PRN
  Administered 2016-04-24: 2 mg via INTRAVENOUS
  Filled 2016-04-24: qty 2

## 2016-04-24 MED ORDER — SUCCINYLCHOLINE CHLORIDE 20 MG/ML IJ SOLN
INTRAMUSCULAR | Status: DC | PRN
  Administered 2016-04-24: 140 mg via INTRAVENOUS

## 2016-04-24 MED ORDER — HYDROMORPHONE HCL 1 MG/ML IJ SOLN
0.2500 mg | INTRAMUSCULAR | Status: DC | PRN
  Administered 2016-04-24 (×4): 0.5 mg via INTRAVENOUS
  Filled 2016-04-24 (×2): qty 1

## 2016-04-24 MED ORDER — LIDOCAINE HCL (PF) 1 % IJ SOLN
INTRAMUSCULAR | Status: AC
  Filled 2016-04-24: qty 5

## 2016-04-24 MED ORDER — SODIUM CHLORIDE 0.9 % IJ SOLN
INTRAMUSCULAR | Status: AC
  Filled 2016-04-24: qty 10

## 2016-04-24 MED ORDER — SUCCINYLCHOLINE CHLORIDE 20 MG/ML IJ SOLN
INTRAMUSCULAR | Status: AC
  Filled 2016-04-24: qty 1

## 2016-04-24 MED ORDER — KETOROLAC TROMETHAMINE 30 MG/ML IJ SOLN
INTRAMUSCULAR | Status: AC
Start: 2016-04-24 — End: 2016-04-24
  Filled 2016-04-24: qty 1

## 2016-04-24 MED ORDER — BUPIVACAINE-EPINEPHRINE (PF) 0.5% -1:200000 IJ SOLN
INTRAMUSCULAR | Status: DC | PRN
  Administered 2016-04-24 (×2): 30 mL

## 2016-04-24 MED ORDER — WARFARIN SODIUM 2.5 MG PO TABS: 2.5000 mg | ORAL_TABLET | Freq: Every day | ORAL | 0 refills | Status: DC

## 2016-04-24 MED ORDER — ROCURONIUM BROMIDE 50 MG/5ML IV SOLN
INTRAVENOUS | Status: AC
  Filled 2016-04-24: qty 1

## 2016-04-24 MED ORDER — ONDANSETRON HCL 4 MG/2ML IJ SOLN
INTRAMUSCULAR | Status: DC | PRN
  Administered 2016-04-24: 4 mg via INTRAVENOUS

## 2016-04-24 MED ORDER — OXYCODONE HCL 5 MG PO TABS
5.0000 mg | ORAL_TABLET | Freq: Once | ORAL | Status: AC
  Administered 2016-04-24: 5 mg via ORAL
  Filled 2016-04-24: qty 1

## 2016-04-24 MED ORDER — ONDANSETRON HCL 4 MG/2ML IJ SOLN
4.0000 mg | Freq: Once | INTRAMUSCULAR | Status: AC
  Administered 2016-04-24: 4 mg via INTRAVENOUS

## 2016-04-24 MED ORDER — DEXAMETHASONE SODIUM PHOSPHATE 4 MG/ML IJ SOLN
INTRAMUSCULAR | Status: AC
  Filled 2016-04-24: qty 2

## 2016-04-24 MED ORDER — EPINEPHRINE HCL 1 MG/ML IJ SOLN
INTRAMUSCULAR | Status: AC
  Filled 2016-04-24: qty 5

## 2016-04-24 MED ORDER — PHENYLEPHRINE 40 MCG/ML (10ML) SYRINGE FOR IV PUSH (FOR BLOOD PRESSURE SUPPORT)
PREFILLED_SYRINGE | INTRAVENOUS | Status: AC
  Filled 2016-04-24: qty 10

## 2016-04-24 MED ORDER — ONDANSETRON HCL 4 MG/2ML IJ SOLN
4.0000 mg | Freq: Once | INTRAMUSCULAR | Status: AC
  Administered 2016-04-24: 4 mg via INTRAVENOUS
  Filled 2016-04-24: qty 2

## 2016-04-24 MED ORDER — DEXAMETHASONE SODIUM PHOSPHATE 4 MG/ML IJ SOLN
4.0000 mg | INTRAMUSCULAR | Status: AC
  Administered 2016-04-24: 4 mg via INTRAVENOUS
  Filled 2016-04-24: qty 1

## 2016-04-24 MED ORDER — PROPOFOL 10 MG/ML IV BOLUS
INTRAVENOUS | Status: AC
  Filled 2016-04-24: qty 40

## 2016-04-24 MED ORDER — FENTANYL CITRATE (PF) 100 MCG/2ML IJ SOLN
25.0000 ug | INTRAMUSCULAR | Status: DC | PRN
Start: 2016-04-24 — End: 2016-04-24
  Administered 2016-04-24: 25 ug via INTRAVENOUS
  Filled 2016-04-24: qty 2

## 2016-04-24 MED ORDER — KETOROLAC TROMETHAMINE 30 MG/ML IJ SOLN
30.0000 mg | Freq: Once | INTRAMUSCULAR | Status: AC
  Administered 2016-04-24: 30 mg via INTRAVENOUS

## 2016-04-24 MED ORDER — BUPIVACAINE-EPINEPHRINE (PF) 0.5% -1:200000 IJ SOLN
INTRAMUSCULAR | Status: AC
  Filled 2016-04-24: qty 60

## 2016-04-24 MED ORDER — LIDOCAINE HCL (CARDIAC) 20 MG/ML IV SOLN
INTRAVENOUS | Status: DC | PRN
  Administered 2016-04-24: 50 mg via INTRAVENOUS

## 2016-04-24 MED ORDER — CHLORHEXIDINE GLUCONATE 4 % EX LIQD: 60.0000 mL | Freq: Once | CUTANEOUS | Status: DC

## 2016-04-24 MED ORDER — LACTATED RINGERS IV SOLN
INTRAVENOUS | Status: DC
  Administered 2016-04-24: 10:00:00 via INTRAVENOUS

## 2016-04-24 MED ORDER — DEXAMETHASONE SODIUM PHOSPHATE 4 MG/ML IJ SOLN
INTRAMUSCULAR | Status: DC | PRN
  Administered 2016-04-24: 8 mg via INTRAVENOUS

## 2016-04-24 MED ORDER — SODIUM CHLORIDE 0.9 % IR SOLN
Status: DC | PRN
  Administered 2016-04-24 (×3): 3000 mL

## 2016-04-24 MED ORDER — SODIUM CHLORIDE 0.9 % IR SOLN
Status: DC | PRN
  Administered 2016-04-24: 1000 mL

## 2016-04-24 MED ORDER — EPHEDRINE SULFATE 50 MG/ML IJ SOLN
INTRAMUSCULAR | Status: AC
  Filled 2016-04-24: qty 1

## 2016-04-24 MED ORDER — GLYCOPYRROLATE 0.2 MG/ML IJ SOLN
INTRAMUSCULAR | Status: DC | PRN
  Administered 2016-04-24: 0.2 mg via INTRAVENOUS

## 2016-04-24 MED ORDER — PROPOFOL 10 MG/ML IV BOLUS
INTRAVENOUS | Status: DC | PRN
  Administered 2016-04-24: 80 mg via INTRAVENOUS
  Administered 2016-04-24: 100 mg via INTRAVENOUS
  Administered 2016-04-24: 50 mg via INTRAVENOUS
  Administered 2016-04-24: 70 mg via INTRAVENOUS
  Administered 2016-04-24: 150 mg via INTRAVENOUS

## 2016-04-24 SURGICAL SUPPLY — 47 items
BAG HAMPER (MISCELLANEOUS) ×2 IMPLANT
BANDAGE ELASTIC 6 LF NS (GAUZE/BANDAGES/DRESSINGS) ×2 IMPLANT
BANDAGE ELASTIC 6 VELCRO NS (GAUZE/BANDAGES/DRESSINGS) ×2 IMPLANT
BLADE AGGRESSIVE PLUS 4.0 (BLADE) ×2 IMPLANT
BLADE SURG SZ11 CARB STEEL (BLADE) ×2 IMPLANT
BUR AGGRESSIVE PLUS 5.0 (BURR) ×2 IMPLANT
CHLORAPREP W/TINT 26ML (MISCELLANEOUS) ×4 IMPLANT
CLOTH BEACON ORANGE TIMEOUT ST (SAFETY) ×2 IMPLANT
COOLER CRYO IC GRAV AND TUBE (ORTHOPEDIC SUPPLIES) ×2 IMPLANT
CUFF CRYO KNEE LG 20X31 COOLER (ORTHOPEDIC SUPPLIES) ×2 IMPLANT
CUFF TOURNIQUET SINGLE 34IN LL (TOURNIQUET CUFF) ×2 IMPLANT
DECANTER SPIKE VIAL GLASS SM (MISCELLANEOUS) ×4 IMPLANT
GAUZE SPONGE 4X4 12PLY STRL (GAUZE/BANDAGES/DRESSINGS) ×2 IMPLANT
GAUZE SPONGE 4X4 16PLY XRAY LF (GAUZE/BANDAGES/DRESSINGS) ×2 IMPLANT
GAUZE XEROFORM 5X9 LF (GAUZE/BANDAGES/DRESSINGS) ×2 IMPLANT
GLOVE BIOGEL PI IND STRL 7.0 (GLOVE) ×1 IMPLANT
GLOVE BIOGEL PI INDICATOR 7.0 (GLOVE) ×1
GLOVE SKINSENSE NS SZ8.0 LF (GLOVE) ×1
GLOVE SKINSENSE STRL SZ8.0 LF (GLOVE) ×1 IMPLANT
GLOVE SS N UNI LF 8.5 STRL (GLOVE) ×2 IMPLANT
GOWN STRL REUS W/ TWL LRG LVL3 (GOWN DISPOSABLE) ×1 IMPLANT
GOWN STRL REUS W/TWL LRG LVL3 (GOWN DISPOSABLE) ×1
GOWN STRL REUS W/TWL XL LVL3 (GOWN DISPOSABLE) ×2 IMPLANT
HLDR LEG FOAM (MISCELLANEOUS) ×1 IMPLANT
IV NS IRRIG 3000ML ARTHROMATIC (IV SOLUTION) ×6 IMPLANT
KIT BLADEGUARD II DBL (SET/KITS/TRAYS/PACK) ×2 IMPLANT
KIT ROOM TURNOVER AP CYSTO (KITS) ×2 IMPLANT
LEG HOLDER FOAM (MISCELLANEOUS) ×1
MANIFOLD NEPTUNE II (INSTRUMENTS) ×2 IMPLANT
MARKER SKIN DUAL TIP RULER LAB (MISCELLANEOUS) ×2 IMPLANT
NEEDLE HYPO 18GX1.5 BLUNT FILL (NEEDLE) ×2 IMPLANT
NEEDLE HYPO 21X1.5 SAFETY (NEEDLE) ×2 IMPLANT
NEEDLE SPNL 18GX3.5 QUINCKE PK (NEEDLE) ×2 IMPLANT
NS IRRIG 1000ML POUR BTL (IV SOLUTION) ×2 IMPLANT
PACK ARTHRO LIMB DRAPE STRL (MISCELLANEOUS) ×2 IMPLANT
PAD ABD 5X9 TENDERSORB (GAUZE/BANDAGES/DRESSINGS) ×4 IMPLANT
PAD ARMBOARD 7.5X6 YLW CONV (MISCELLANEOUS) ×2 IMPLANT
PADDING CAST COTTON 6X4 STRL (CAST SUPPLIES) ×2 IMPLANT
PADDING WEBRIL 6 STERILE (GAUZE/BANDAGES/DRESSINGS) ×2 IMPLANT
SET ARTHROSCOPY INST (INSTRUMENTS) ×2 IMPLANT
SET ARTHROSCOPY PUMP TUBE (IRRIGATION / IRRIGATOR) ×2 IMPLANT
SET BASIN LINEN APH (SET/KITS/TRAYS/PACK) ×2 IMPLANT
SUT ETHILON 3 0 FSL (SUTURE) ×2 IMPLANT
SYR 30ML LL (SYRINGE) ×2 IMPLANT
SYRINGE 10CC LL (SYRINGE) ×2 IMPLANT
TUBE CONNECTING 12X1/4 (SUCTIONS) ×6 IMPLANT
WAND 90 DEG TURBOVAC W/CORD (SURGICAL WAND) ×2 IMPLANT

## 2016-04-24 NOTE — Transfer of Care (Signed)
Immediate Anesthesia Transfer of Care Note  Patient: Willie Howe  Procedure(s) Performed: Procedure(s): LEFT KNEE ARTHROSCOPY WITH PARTIAL MEDIAL MENISECTOMY (Left)  Patient Location: PACU  Anesthesia Type:General  Level of Consciousness: awake, oriented and patient cooperative  Airway & Oxygen Therapy: Patient Spontanous Breathing and Patient connected to face mask oxygen  Post-op Assessment: Report given to RN and Post -op Vital signs reviewed and stable  Post vital signs: Reviewed and stable  Last Vitals:  Vitals:   04/24/16 0930 04/24/16 0935  BP: 117/76 116/80  Resp: 17 14  Temp:      Last Pain:  Vitals:   04/24/16 0832  TempSrc: Oral  PainSc: 7       Patients Stated Pain Goal: 6 ([phone] [id number])  Complications: No apparent anesthesia complications

## 2016-04-24 NOTE — Anesthesia Postprocedure Evaluation (Signed)
Anesthesia Post Note  Patient: Willie Howe  Procedure(s) Performed: Procedure(s) (LRB): LEFT KNEE ARTHROSCOPY WITH PARTIAL MEDIAL MENISECTOMY (Left)  Patient location during evaluation: PACU Anesthesia Type: General Level of consciousness: awake and alert and oriented Pain management: pain level controlled Vital Signs Assessment: post-procedure vital signs reviewed and stable Respiratory status: spontaneous breathing and patient connected to face mask oxygen Cardiovascular status: stable Postop Assessment: no signs of nausea or vomiting Anesthetic complications: no    Last Vitals:  Vitals:   04/24/16 0935 04/24/16 1135  BP: 116/80   Resp: 14   Temp:  36.5 C    Last Pain:  Vitals:   04/24/16 1135  TempSrc:   PainSc: 0-No pain                 ADAMS, AMY A

## 2016-04-24 NOTE — Interval H&P Note (Signed)
History and Physical Interval Note:  04/24/2016 9:55 AM BP 116/80   Temp 97.7 F (36.5 C) (Oral)   Resp 14   SpO2 96%  Skin normal at surgical site Willie Howe  has presented today for surgery, with the diagnosis of left knee medial meniscus tear  The various methods of treatment have been discussed with the patient and family. After consideration of risks, benefits and other options for treatment, the patient has consented to  Procedure(s) with comments: KNEE ARTHROSCOPY WITH MEDIAL MENISECTOMY (Left) - no crutch training as a surgical intervention .  The patient's history has been reviewed, patient examined, no change in status, stable for surgery.  I have reviewed the patient's chart and labs.  Questions were answered to the patient's satisfaction.     Arther Abbott

## 2016-04-24 NOTE — Anesthesia Procedure Notes (Signed)
Procedure Name: LMA Insertion Date/Time: 04/24/2016 10:11 AM Performed by: Andree Elk, Bernita Beckstrom A Pre-anesthesia Checklist: Timeout performed, Patient identified, Emergency Drugs available, Suction available and Patient being monitored Patient Re-evaluated:Patient Re-evaluated prior to inductionOxygen Delivery Method: Circle system utilized Preoxygenation: Pre-oxygenation with 100% oxygen Intubation Type: IV induction LMA: LMA inserted LMA Size: 4.5 Number of attempts: 1 Placement Confirmation: positive ETCO2 and breath sounds checked- equal and bilateral Tube secured with: Tape Dental Injury: Teeth and Oropharynx as per pre-operative assessment

## 2016-04-24 NOTE — Brief Op Note (Signed)
04/24/2016  11:19 AM  PATIENT:  Willie Howe  55 y.o. male  PRE-OPERATIVE DIAGNOSIS:  left knee medial meniscus tear  POST-OPERATIVE DIAGNOSIS:  left knee medial meniscus tear with severe arthritis  PROCEDURE:  Procedure(s): LEFT KNEE ARTHROSCOPY WITH PARTIAL MEDIAL MENISECTOMY (Left) Operative findings severe arthritis of the patellofemoral compartment primarily lateral facet with grade 4 patella and trochlear changes  The lateral compartment had a mild chondral lesion grade 1  Medial compartment had grade 2 chondral lesion  Extensive tear posterior horn medial meniscus  Anterior cruciate ligament PCL intact  Significant events severe synovitis noted in the knee  Knee arthroscopy dictation  The patient was identified in the preoperative holding area using 2 approved identification mechanisms. The chart was reviewed and updated. The surgical site was confirmed as left knee and marked with an indelible marker.  The patient was taken to the operating room for anesthesia. After  anesthesi initial LMA we converted to general to get better relaxation  Ancef 2 g IV was used as IV antibiotics.  The patient was placed in the supine position with the (left the operative extremity in an arthroscopic leg holder and the opposite extremity in a padded leg holder.  The timeout was executed.  A lateral portal was established with an 11 blade and the scope was introduced into the joint. A diagnostic arthroscopy was performed in circumferential manner examining the entire knee joint. A medial portal was established and the diagnostic arthroscopy was repeated using a probe to palpate intra-articular structures as they were encountered.    The medial meniscus was resected using a duckbill forceps. The meniscal fragments were removed with a motorized shaver. The meniscus was balanced with a combination of a motorized shaver and a 50 ArthroCare wand until a stable rim was obtained. brief side effect  me was performed to improve visualization where needed  The arthroscopic pump was placed on the wash mode and any excess debris was removed from the joint using suction.  60 cc of Marcaine with epinephrine was injected through the arthroscope.  The portals were closed with 3-0 nylon suture.  A sterile bandage, Ace wrap and Cryo/Cuff was placed and the Cryo/Cuff was activated. The patient was taken to the recovery room in stable condition.  SURGEON:  Surgeon(s) and Role:    * Carole Civil, MD - Primary  PHYSICIAN ASSISTANT:   ASSISTANTS: none   ANESTHESIA:   general and Worse with LMA then conversion to intubation with kaleidoscope  EBL:  Total I/O In: 400 [I.V.:400] Out: -   BLOOD ADMINISTERED:none  DRAINS: none   LOCAL MEDICATIONS USED:  MARCAINE     SPECIMEN:  No Specimen  DISPOSITION OF SPECIMEN:  N/A  COUNTS:  YES  TOURNIQUET:    DICTATION: .Dragon Dictation  PLAN OF CARE: Discharge to home after PACU  PATIENT DISPOSITION:  PACU - hemodynamically stable.   Delay start of Pharmacological VTE agent (>24hrs) due to surgical blood loss or risk of bleeding: yes

## 2016-04-24 NOTE — Op Note (Signed)
04/24/2016  11:19 AM  PATIENT:  Willie Howe  55 y.o. male  PRE-OPERATIVE DIAGNOSIS:  left knee medial meniscus tear  POST-OPERATIVE DIAGNOSIS:  left knee medial meniscus tear with severe arthritis  PROCEDURE:  Procedure(s): LEFT KNEE ARTHROSCOPY WITH PARTIAL MEDIAL MENISECTOMY (Left) Operative findings severe arthritis of the patellofemoral compartment primarily lateral facet with grade 4 patella and trochlear changes  The lateral compartment had a mild chondral lesion grade 1  Medial compartment had grade 2 chondral lesion  Extensive tear posterior horn medial meniscus  Anterior cruciate ligament PCL intact  Significant events severe synovitis noted in the knee  Knee arthroscopy dictation  The patient was identified in the preoperative holding area using 2 approved identification mechanisms. The chart was reviewed and updated. The surgical site was confirmed as left knee and marked with an indelible marker.  The patient was taken to the operating room for anesthesia. After  anesthesi initial LMA we converted to general to get better relaxation  Ancef 2 g IV was used as IV antibiotics.  The patient was placed in the supine position with the (left the operative extremity in an arthroscopic leg holder and the opposite extremity in a padded leg holder.  The timeout was executed.  A lateral portal was established with an 11 blade and the scope was introduced into the joint. A diagnostic arthroscopy was performed in circumferential manner examining the entire knee joint. A medial portal was established and the diagnostic arthroscopy was repeated using a probe to palpate intra-articular structures as they were encountered.    The medial meniscus was resected using a duckbill forceps. The meniscal fragments were removed with a motorized shaver. The meniscus was balanced with a combination of a motorized shaver and a 50 ArthroCare wand until a stable rim was obtained. brief side effect  me was performed to improve visualization where needed  The arthroscopic pump was placed on the wash mode and any excess debris was removed from the joint using suction.  60 cc of Marcaine with epinephrine was injected through the arthroscope.  The portals were closed with 3-0 nylon suture.  A sterile bandage, Ace wrap and Cryo/Cuff was placed and the Cryo/Cuff was activated. The patient was taken to the recovery room in stable condition.  SURGEON:  Surgeon(s) and Role:    * Carole Civil, MD - Primary  PHYSICIAN ASSISTANT:   ASSISTANTS: none   ANESTHESIA:   general and Worse with LMA then conversion to intubation with kaleidoscope  EBL:  Total I/O In: 400 [I.V.:400] Out: -   BLOOD ADMINISTERED:none  DRAINS: none   LOCAL MEDICATIONS USED:  MARCAINE     SPECIMEN:  No Specimen  DISPOSITION OF SPECIMEN:  N/A  COUNTS:  YES  TOURNIQUET:    DICTATION: .Dragon Dictation  PLAN OF CARE: Discharge to home after PACU  PATIENT DISPOSITION:  PACU - hemodynamically stable.   Delay start of Pharmacological VTE agent (>24hrs) due to surgical blood loss or risk of bleeding: yes

## 2016-04-24 NOTE — Discharge Instructions (Signed)
PATIENT INSTRUCTIONS POST-ANESTHESIA  IMMEDIATELY FOLLOWING SURGERY:  Do not drive or operate machinery for the first twenty four hours after surgery.  Do not make any important decisions for twenty four hours after surgery or while taking narcotic pain medications or sedatives.  If you develop intractable nausea and vomiting or a severe headache please notify your doctor immediately.  FOLLOW-UP:  Please make an appointment with your surgeon as instructed. You do not need to follow up with anesthesia unless specifically instructed to do so.  WOUND CARE INSTRUCTIONS (if applicable):  Keep a dry clean dressing on the anesthesia/puncture wound site if there is drainage.  Once the wound has quit draining you may leave it open to air.  Generally you should leave the bandage intact for twenty four hours unless there is drainage.  If the epidural site drains for more than 36-48 hours please call the anesthesia department.  QUESTIONS?:  Please feel free to call your physician or the hospital operator if you have any questions, and they will be happy to assist you.       Knee Arthroscopy, Care After Refer to this sheet in the next few weeks. These instructions provide you with information about caring for yourself after your procedure. Your health care provider may also give you more specific instructions. Your treatment has been planned according to current medical practices, but problems sometimes occur. Call your health care provider if you have any problems or questions after your procedure. WHAT TO EXPECT AFTER THE PROCEDURE After your procedure, it is common to have:  Soreness.  Pain. HOME CARE INSTRUCTIONS Bathing  Do not take baths, swim, or use a hot tub until your health care provider approves. Incision Care  There are many different ways to close and cover an incision, including stitches, skin glue, and adhesive strips. Follow your health care provider's instructions about:  Incision  care.  Bandage (dressing) changes and removal.  Incision closure removal.  Check your incision area every day for signs of infection. Watch for:  Redness, swelling, or pain.  Fluid, blood, or pus. Activity  Avoid strenuous activities for as long as directed by your health care provider.  Return to your normal activities as directed by your health care provider. Ask your health care provider what activities are safe for you.  Perform range-of-motion exercises only as directed by your health care provider.  Do not lift anything that is heavier than 10 lb (4.5 kg).  Do not drive or operate heavy machinery while taking pain medicine.  If you were given crutches, use them as directed by your health care provider. Managing Pain, Stiffness, and Swelling  If directed, apply ice to the injured area:  Put ice in a plastic bag.  Place a towel between your skin and the bag.  Leave the ice on for 20 minutes, 2-3 times per day.  Raise the injured area above the level of your heart while you are sitting or lying down as directed by your health care provider. General Instructions  Keep all follow-up visits as directed by your health care provider. This is important.  Take medicines only as directed by your health care provider.  Do not use any tobacco products, including cigarettes, chewing tobacco, or electronic cigarettes. If you need help quitting, ask your health care provider.  If you were given compression stockings, wear them as directed by your health care provider. These stockings help prevent blood clots and reduce swelling in your legs. SEEK MEDICAL CARE IF:  You have severe pain with any movement of your knee.  You notice a bad smell coming from the incision or dressing.  You have redness, swelling, or pain at the site of your incision.  You have fluid, blood, or pus coming from your incision. SEEK IMMEDIATE MEDICAL CARE IF:  You develop a rash.  You have a  fever.  You have difficulty breathing or have shortness of breath.  You develop pain in your calves or in the back of your knee.  You develop chest pain.  You develop numbness or tingling in your leg or foot.   This information is not intended to replace advice given to you by your health care provider. Make sure you discuss any questions you have with your health care provider.   Document Released: 02/21/2005 Document Revised: 12/19/2014 Document Reviewed: 07/31/2014 Elsevier Interactive Patient Education 2016 [employer]  WEIGHT BARING AS TOLERATED REST WITH LEG ELEVATED USE CRYO CUFF AS INSTRUCTED

## 2016-04-24 NOTE — Anesthesia Preprocedure Evaluation (Signed)
Anesthesia Evaluation  Patient identified by MRN, date of birth, ID band Patient awake    Reviewed: Allergy & Precautions, NPO status , Patient's Chart, lab work & pertinent test results  Airway Mallampati: II  TM Distance: >3 FB     Dental  (+) Partial Upper, Dental Advisory Given, Teeth Intact   Pulmonary sleep apnea , former smoker, PE   breath sounds clear to auscultation       Cardiovascular (-) angina+ DVT (Pulm embolus after back surgery. IVC filter now.)  + dysrhythmias (hx irreg HR, neg workup by cardiologist)  Rhythm:Regular Rate:Normal     Neuro/Psych  Headaches,    GI/Hepatic negative GI ROS,   Endo/Other  Hyperthyroidism (euthyroid now)   Renal/GU      Musculoskeletal  (+) Arthritis , Rheumatoid disorders,    Abdominal   Peds  Hematology   Anesthesia Other Findings   Reproductive/Obstetrics                             Anesthesia Physical Anesthesia Plan  ASA: III  Anesthesia Plan: General   Post-op Pain Management:    Induction: Intravenous  Airway Management Planned: LMA  Additional Equipment:   Intra-op Plan:   Post-operative Plan: Extubation in OR  Informed Consent: I have reviewed the patients History and Physical, chart, labs and discussed the procedure including the risks, benefits and alternatives for the proposed anesthesia with the patient or authorized representative who has indicated his/her understanding and acceptance.     Plan Discussed with:   Anesthesia Plan Comments:         Anesthesia Quick Evaluation

## 2016-04-24 NOTE — Anesthesia Procedure Notes (Signed)
Procedure Name: Intubation Date/Time: 04/24/2016 10:45 AM Performed by: Andree Elk, Laniqua Torrens A Pre-anesthesia Checklist: Patient identified, Timeout performed, Emergency Drugs available, Suction available and Patient being monitored Patient Re-evaluated:Patient Re-evaluated prior to inductionOxygen Delivery Method: Circle system utilized Preoxygenation: Pre-oxygenation with 100% oxygen Intubation Type: Combination inhalational/ intravenous induction Laryngoscope Size: Glidescope and 4 Number of attempts: 1 Airway Equipment and Method: Video-laryngoscopy Placement Confirmation: ETT inserted through vocal cords under direct vision,  positive ETCO2 and breath sounds checked- equal and bilateral Secured at: 24 cm Tube secured with: Tape Dental Injury: Teeth and Oropharynx as per pre-operative assessment

## 2016-04-24 NOTE — Addendum Note (Signed)
Addendum  created 04/24/16 1253 by Mickel Baas, CRNA   Anesthesia Intra Flowsheets edited

## 2016-04-28 ENCOUNTER — Encounter: Payer: Self-pay | Admitting: Orthopedic Surgery

## 2016-04-28 ENCOUNTER — Ambulatory Visit (INDEPENDENT_AMBULATORY_CARE_PROVIDER_SITE_OTHER): Payer: BLUE CROSS/BLUE SHIELD | Admitting: Orthopedic Surgery

## 2016-04-28 VITALS — BP 129/80 | HR 92 | Ht 74.5 in | Wt 277.0 lb

## 2016-04-28 DIAGNOSIS — Z9889 Other specified postprocedural states: Secondary | ICD-10-CM

## 2016-04-28 DIAGNOSIS — Z4789 Encounter for other orthopedic aftercare: Secondary | ICD-10-CM

## 2016-04-28 NOTE — Patient Instructions (Signed)
START THERAPY

## 2016-04-28 NOTE — Progress Notes (Signed)
Chief Complaint  Patient presents with  . Follow-up    POST OP 1 SALK MM, DOS 04/24/16    BP 129/80   Pulse 92   Ht 6' 2.5" (1.892 m)   Wt 277 lb (125.6 kg)   BMI 35.09 kg/m   Portals are clean Is soft and supple  Patient is on Coumadin 2.5 nontherapeutic dose  Recommend therapy for 3 weeks follow-up in 4 weeks.

## 2016-04-29 ENCOUNTER — Encounter (HOSPITAL_COMMUNITY): Payer: Self-pay | Admitting: Orthopedic Surgery

## 2016-05-01 ENCOUNTER — Telehealth: Payer: Self-pay | Admitting: Orthopedic Surgery

## 2016-05-01 NOTE — Telephone Encounter (Signed)
Patient called with question, status post arthroscopy left knee,04/24/16.  States "hard to put weight on it".  States is scheduled to start therapy next week, and has been doing home exercises. Appointment offered for tomorrow morning with Dr Aline Brochure.  States has another appointment, unable to come.  Please advise of any recommendations.  (routing to Dr Aline Brochure, nurse not available this afternoon)

## 2016-05-01 NOTE — Telephone Encounter (Signed)
Please try to put weight on it  as much as he can tolerate even if only a little

## 2016-05-02 NOTE — Telephone Encounter (Signed)
Returned call, no answer.

## 2016-05-02 NOTE — Telephone Encounter (Signed)
Routing to nurse, as patient may have more quesitons

## 2016-05-05 ENCOUNTER — Other Ambulatory Visit: Payer: Self-pay | Admitting: Orthopedic Surgery

## 2016-05-05 MED ORDER — OXYCODONE-ACETAMINOPHEN 5-325 MG PO TABS: 1.0000 | ORAL_TABLET | ORAL | 0 refills | Status: DC | PRN

## 2016-05-05 NOTE — Telephone Encounter (Signed)
Patient aware  Requesting refill on pain medication, last filled oxycodone after surgery

## 2016-05-06 ENCOUNTER — Encounter: Payer: Self-pay | Admitting: Orthopedic Surgery

## 2016-05-13 ENCOUNTER — Other Ambulatory Visit: Payer: Self-pay | Admitting: Orthopedic Surgery

## 2016-05-13 MED ORDER — HYDROCODONE-ACETAMINOPHEN 10-325 MG PO TABS: 1.0000 | ORAL_TABLET | ORAL | 0 refills | Status: DC | PRN

## 2016-05-26 ENCOUNTER — Ambulatory Visit (INDEPENDENT_AMBULATORY_CARE_PROVIDER_SITE_OTHER): Payer: BLUE CROSS/BLUE SHIELD | Admitting: Orthopedic Surgery

## 2016-05-26 ENCOUNTER — Encounter: Payer: Self-pay | Admitting: Orthopedic Surgery

## 2016-05-26 VITALS — BP 135/85 | HR 86 | Wt 275.0 lb

## 2016-05-26 DIAGNOSIS — Z981 Arthrodesis status: Secondary | ICD-10-CM

## 2016-05-26 DIAGNOSIS — G8929 Other chronic pain: Secondary | ICD-10-CM

## 2016-05-26 DIAGNOSIS — M05762 Rheumatoid arthritis with rheumatoid factor of left knee without organ or systems involvement: Secondary | ICD-10-CM

## 2016-05-26 DIAGNOSIS — M25562 Pain in left knee: Secondary | ICD-10-CM

## 2016-05-26 DIAGNOSIS — Z9889 Other specified postprocedural states: Secondary | ICD-10-CM

## 2016-05-26 MED ORDER — HYDROCODONE-ACETAMINOPHEN 10-325 MG PO TABS: 1.0000 | ORAL_TABLET | ORAL | 0 refills | Status: DC | PRN

## 2016-05-26 NOTE — Patient Instructions (Signed)
Continue exercises left knee at home

## 2016-05-26 NOTE — Progress Notes (Signed)
Patient ID: Willie Howe, male   DOB: [birth year], 55 y.o.   MRN: [id number]  Post op visit   Chief Complaint  Patient presents with  . Follow-up    post op 2, SALK MM, DOS 04/24/16    BP 135/85   Pulse 86   Wt 275 lb (124.7 kg)   BMI 34.84 kg/m   Postop visit status post arthroscopy left knee medial meniscectomy patient had severe arthritis of his left knee  Still complains of anterior knee pain. Patellar pain  However he is regained good flexion and his knee 115 has a 3 flexion contracture which is chronic  He would like to have his knee replacement done prior to the end of the year  I will like him to continue his exercises at this time and then will come back in a month and we'll try to get his knee replacement scheduled for the years over.

## 2016-06-05 ENCOUNTER — Other Ambulatory Visit: Payer: Self-pay | Admitting: Orthopedic Surgery

## 2016-06-05 MED ORDER — HYDROCODONE-ACETAMINOPHEN 10-325 MG PO TABS: 1.0000 | ORAL_TABLET | ORAL | 0 refills | Status: DC | PRN

## 2016-06-06 ENCOUNTER — Other Ambulatory Visit: Payer: Self-pay | Admitting: *Deleted

## 2016-06-06 MED ORDER — HYDROCODONE-ACETAMINOPHEN 10-325 MG PO TABS: 1.0000 | ORAL_TABLET | ORAL | 0 refills | Status: DC | PRN

## 2016-06-10 ENCOUNTER — Encounter: Payer: Self-pay | Admitting: Neurology

## 2016-06-12 ENCOUNTER — Encounter: Payer: Self-pay | Admitting: Neurology

## 2016-06-12 ENCOUNTER — Ambulatory Visit (INDEPENDENT_AMBULATORY_CARE_PROVIDER_SITE_OTHER): Payer: BLUE CROSS/BLUE SHIELD | Admitting: Neurology

## 2016-06-12 VITALS — BP 134/82 | HR 78 | Resp 18 | Ht 74.5 in | Wt 276.0 lb

## 2016-06-12 DIAGNOSIS — G4733 Obstructive sleep apnea (adult) (pediatric): Secondary | ICD-10-CM

## 2016-06-12 DIAGNOSIS — G4752 REM sleep behavior disorder: Secondary | ICD-10-CM

## 2016-06-12 DIAGNOSIS — Z9989 Dependence on other enabling machines and devices: Secondary | ICD-10-CM | POA: Diagnosis not present

## 2016-06-12 NOTE — Patient Instructions (Addendum)
Please continue using your CPAP regularly. While your insurance requires that you use CPAP at least 4 hours each night on 70% of the nights, I recommend, that you not skip any nights and use it throughout the night if you can. Getting used to CPAP and staying with the treatment long term does take time and patience and discipline. Untreated obstructive sleep apnea when it is moderate to severe can have an adverse impact on cardiovascular health and raise her risk for heart disease, arrhythmias, hypertension, congestive heart failure, stroke and diabetes. Untreated obstructive sleep apnea causes sleep disruption, nonrestorative sleep, and sleep deprivation. This can have an impact on your day to day functioning and cause daytime sleepiness and impairment of cognitive function, memory loss, mood disturbance, and problems focussing. Using CPAP regularly can improve these symptoms.  We have two options to troubleshoot CPAP mask discomfort: I will have Robin, sleep lab manager, call you: you may be able to come in for a PAP nap in the sleep lab, with a daytime appointment. We may be able to try another full mask too. You will have to bring your machine and mask. Again, Shirlean Mylar should call you directly for this.   In the interim, I will reduce your pressure to 13 cm.   We will continue to monitor your dream enactments.
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
## 2016-06-17 ENCOUNTER — Telehealth: Payer: Self-pay | Admitting: Orthopedic Surgery

## 2016-06-17 MED ORDER — HYDROCODONE-ACETAMINOPHEN 10-325 MG PO TABS: 1.0000 | ORAL_TABLET | ORAL | 0 refills | Status: DC | PRN

## 2016-06-18 ENCOUNTER — Telehealth: Payer: Self-pay

## 2016-06-18 NOTE — Telephone Encounter (Signed)
Patient came for pap nap. He brought his Airview mask size medium. After hooking him up to cpap his mask had a high leak. He needed a large in this. He liked this fit better.He did not have to tighten mask as much. I also tried the airtouch F20 medium. He did great with this mask at a 0 leak. He liked the feel of the mask. This mask should not leave a mark on nose. He will take both of these home and try for a few weeks. He will let me know which one he likes. I will let his DME company know which one he will be needing when it is time for a new mask.

## 2016-06-24 ENCOUNTER — Encounter: Payer: Self-pay | Admitting: Orthopedic Surgery

## 2016-06-24 ENCOUNTER — Ambulatory Visit (INDEPENDENT_AMBULATORY_CARE_PROVIDER_SITE_OTHER): Payer: BLUE CROSS/BLUE SHIELD | Admitting: Orthopedic Surgery

## 2016-06-24 DIAGNOSIS — Z9889 Other specified postprocedural states: Secondary | ICD-10-CM

## 2016-06-24 DIAGNOSIS — M05762 Rheumatoid arthritis with rheumatoid factor of left knee without organ or systems involvement: Secondary | ICD-10-CM

## 2016-06-24 DIAGNOSIS — Z981 Arthrodesis status: Secondary | ICD-10-CM

## 2016-06-24 DIAGNOSIS — Z4789 Encounter for other orthopedic aftercare: Secondary | ICD-10-CM

## 2016-06-24 DIAGNOSIS — M25562 Pain in left knee: Secondary | ICD-10-CM | POA: Diagnosis not present

## 2016-06-24 DIAGNOSIS — M1712 Unilateral primary osteoarthritis, left knee: Secondary | ICD-10-CM

## 2016-06-24 DIAGNOSIS — G8929 Other chronic pain: Secondary | ICD-10-CM

## 2016-06-24 NOTE — Progress Notes (Signed)
Patient ID: Willie Howe, male   DOB: 1961-06-24, 55 y.o.   MRN: [id number]  Chief Complaint  Patient presents with  . Follow-up    POST OP SALK 04/24/16    HPI ABBIE Howe is a 55 y.o. male.  [name] had a right total knee in 2016 had a knee arthroscopy in September of this year presents with continued pain in his left knee, arthroscopic, imaging showing severe arthritis in the left knee. He had prior surgery at the age of 26 for ligament damage, not sure the exact procedure.  He now complains of persistent knee pain which is severe seems to be episodic lasting 2-3 days at a time when present past states him from activities of daily living. He is requiring use of a cane as well as hydrocodone 10 mg every 4-6 hour basis  He also takes Remicade, methotrexate and prednisone for rheumatoid arthritis.  He's had several blood clots.  He recently had his IVC filter removed.  After his knee arthroscopy we placed him on low-dose nontherapeutic Coumadin.  Review of Systems Review of Systems  Constitutional: Negative.   Gastrointestinal: Negative.   Genitourinary: Negative.     Past Medical History:  Diagnosis Date  . Anginal pain ([place])   . Arthritis   . BPPV (benign paroxysmal positional vertigo)   . DVT (deep venous thrombosis) ([place])   . Dysrhythmia   . Graves disease 1996  . History of kidney stones   . History of pulmonary embolism   . Obesity   . Previous back surgery    x 3  . Rheumatoid arthritis ([place])   . Sleep apnea   . Thyroid disease    Graves Disease    Past Surgical History:  Procedure Laterality Date  . BACK SURGERY    . COLONOSCOPY    . EXAM UNDER ANESTHESIA WITH MANIPULATION OF KNEE Right 04/18/2015   Procedure: MANIPULATION OF RIGHT KNEE UNDER ANESTHESIA;  Surgeon: Carole Civil, MD;  [facility];  Service: Orthopedics;  Laterality: Right;  . IVC Filter    . KNEE ARTHROSCOPY Right 2006   meniscus   . KNEE ARTHROSCOPY WITH MEDIAL MENISECTOMY Left  04/24/2016   Procedure: LEFT KNEE ARTHROSCOPY WITH PARTIAL MEDIAL MENISECTOMY;  Surgeon: Carole Civil, MD;  [facility];  Service: Orthopedics;  Laterality: Left;  . KNEE SURGERY     x 2  . KNEE SURGERY Left 1976   ?ligament repair   . LUMBAR WOUND DEBRIDEMENT N/A 02/23/2014   Procedure: LUMBAR WOUND DEBRIDEMENT;  Surgeon: Faythe Ghee, MD;  Location: [facility];  Service: Neurosurgery;  Laterality: N/A;  exploration lumbar wound. repair of dural defect.  Marland Kitchen MENISECTOMY Right    open medial   . NEPHROLITHOTOMY    . PERIPHERAL VASCULAR CATHETERIZATION N/A 01/30/2015   Procedure: IVC Filter Insertion;  Surgeon: Serafina Mitchell, MD;  Location: [facility];  Service: Cardiovascular;  Laterality: N/A;  . PERIPHERAL VASCULAR CATHETERIZATION N/A 04/24/2015   Procedure: IVC Filter Removal;  Surgeon: Serafina Mitchell, MD;  Location: [facility];  Service: Cardiovascular;  Laterality: N/A;  . PERIPHERAL VASCULAR CATHETERIZATION N/A 08/14/2015   Procedure: IVC Filter Removal;  Surgeon: Serafina Mitchell, MD;  Location: [facility];  Service: Cardiovascular;  Laterality: N/A;  . SPINE SURGERY  2015   Dr Hal Neer Fusion   . SPINE SURGERY  1999   discectomy  . TOTAL KNEE ARTHROPLASTY Right 02/02/2015   Procedure:  RIGHT TOTAL KNEE ARTHROPLASTY;  Surgeon: Carole Civil, MD;  [facility];  Service: Orthopedics;  Laterality: Right;  LM with pt's daughter of new arrival time (10:45)      Social History Social History  Substance Use Topics  . Smoking status: Former Smoker    Packs/day: 1.50    Years: 20.00    Types: Cigarettes    Quit date: 02/03/1995  . Smokeless tobacco: Never Used  . Alcohol use No    No Known Allergies  Current Meds  Medication Sig  . aspirin EC 81 MG tablet Take 81 mg by mouth daily.   Mariane Baumgarten Calcium (STOOL SOFTENER PO) Take 1 capsule by mouth daily.   . folic acid (FOLVITE) 1 MG tablet Take 1 mg by mouth daily.  Marland Kitchen HYDROcodone-acetaminophen  (NORCO) 10-325 MG tablet Take 1 tablet by mouth every 4 (four) hours as needed.  . inFLIXimab (REMICADE) 100 MG injection Inject into the vein.  Marland Kitchen loratadine (CLARITIN) 10 MG tablet Take 10 mg by mouth daily as needed for allergies.   . metFORMIN (GLUCOPHAGE) 500 MG tablet Take by mouth daily with breakfast.  . methotrexate (RHEUMATREX) 2.5 MG tablet Take 20 mg by mouth once a week. 8 tablets on Friday. Caution:Chemotherapy. Protect from light.  . predniSONE (DELTASONE) 5 MG tablet Take 5 mg by mouth daily.   . tamsulosin (FLOMAX) 0.4 MG CAPS capsule Take 0.4 mg by mouth daily.    Physical Exam Physical Exam There were no vitals taken for this visit.  Gen. appearance. The patient is well-developed and well-nourished, grooming and hygiene are normal. There are no gross congenital abnormalities  The patient is alert and oriented to person place and time  Mood and affect are normal  AmCane supported ambulation  Examination reveals the following: On inspection we fianterior scar over the left knee from prior surgeries. There is no swelling he has a varus deformity and medial joint line tenderness and crepitation on range of motion  With the range of motion of   3-110 degrees in the left knee  Stability tests were normal   no instability  Strength tests revealed grade 5 motor strength  Skin we find no rash ulceration or erythema  Sensation remains intact  Impression vascular system shows no peripheral edema  Data Reviewed  June 2017 Left knee no trauma left knee 3 x-rays for pop felt in left knee   Patellofemoral joint looks severely diseased bone spur at the tibial tubercle   This is most visible on the axial view with bone spurs looks to have central tracking   [facility] x-ray shows medial and lateral mild degenerative disease with some spurring. May have effusion.   Impression mild OA with effusion severe patellofemoral disease   Assessment    osteoarthritis left  knee Encounter Diagnoses  Name Primary?  . S/P left knee arthroscopy   . Chronic pain of left knee   . Rheumatoid arthritis involving left knee with positive rheumatoid factor ([place])   . S/P lumbar spinal fusion   . Surgical aftercare, musculoskeletal system   . Primary osteoarthritis of left knee Yes       Plan    This procedure has been fully reviewed with the patient and written informed consent has been obtained.  Left total knee       Arther Abbott 06/24/2016, 9:21 AM

## 2016-06-24 NOTE — Progress Notes (Signed)
Addendum the patient's rheumatologist is Dr. Gavin Pound  The Remicade injection was done on October 27 take her 6 week intervals  The patient would like to go to therapy sport for postoperative rehabilitation  The patient is at higher risk than aspirin would allow since he's had prior DVT and PE  Agreed to Coumadin therapy

## 2016-06-24 NOTE — Patient Instructions (Signed)
LEFT TKA 07/24/16

## 2016-06-24 NOTE — Addendum Note (Signed)
Addended by: Baldomero Lamy B on: 06/24/2016 09:47 AM   Modules accepted: Orders, SmartSet

## 2016-06-30 ENCOUNTER — Other Ambulatory Visit: Payer: Self-pay | Admitting: Orthopaedic Surgery

## 2016-06-30 ENCOUNTER — Other Ambulatory Visit: Payer: Self-pay | Admitting: *Deleted

## 2016-06-30 MED ORDER — HYDROCODONE-ACETAMINOPHEN 10-325 MG PO TABS: 1.0000 | ORAL_TABLET | ORAL | 0 refills | Status: DC | PRN

## 2016-07-03 ENCOUNTER — Encounter: Payer: Self-pay | Admitting: *Deleted

## 2016-07-08 ENCOUNTER — Telehealth: Payer: Self-pay | Admitting: Neurology

## 2016-07-09 ENCOUNTER — Encounter: Payer: Self-pay | Admitting: *Deleted

## 2016-07-14 ENCOUNTER — Other Ambulatory Visit: Payer: Self-pay | Admitting: Orthopedic Surgery

## 2016-07-14 MED ORDER — HYDROCODONE-ACETAMINOPHEN 7.5-325 MG PO TABS: 1.0000 | ORAL_TABLET | Freq: Four times a day (QID) | ORAL | 0 refills | Status: DC | PRN

## 2016-07-14 NOTE — Progress Notes (Signed)
Refill med with decrease dose

## 2016-07-15 NOTE — Telephone Encounter (Signed)
This was entered in error

## 2016-07-18 NOTE — Patient Instructions (Signed)
Willie Howe  07/18/2016     @PREFPERIOPPHARMACY @   Your procedure is scheduled on  07/24/2016   Report to [name] at  52   A.M.  Call this number if you have problems the morning of surgery:  [phone]   Remember:  Do not eat food or drink liquids after midnight.  Take these medicines the morning of surgery with A SIP OF WATER  Hydrocodone, claritin, deltasone, flomax.   Do not wear jewelry, make-up or nail polish.  Do not wear lotions, powders, or perfumes, or deoderant.  Do not shave 48 hours prior to surgery.  Men may shave face and neck.  Do not bring valuables to the hospital.  [facility] is not responsible for any belongings or valuables.  Contacts, dentures or bridgework may not be worn into surgery.  Leave your suitcase in the car.  After surgery it may be brought to your room.  For patients admitted to the hospital, discharge time will be determined by your treatment team.  Patients discharged the day of surgery will not be allowed to drive home.   Name and phone number of your driver:   family Special instructions:   none  Please read over the following fact sheets that you were given. Anesthesia Post-op Instructions and Care and Recovery After Surgery       Total Knee Replacement Total knee replacement is a surgery to replace your knee joint with a man-made (prosthetic) joint. The man-made joint is called a prosthesis. It replaces parts of the thigh bone (femur), lower leg bone (tibia), and kneecap (patella). This surgery is done to lessen pain and improve knee movement. What happens before the procedure?  Ask your doctor about:  Changing or stopping your normal medicines. This is especially important if you take diabetes medicines or blood thinners.  Taking medicines such as aspirin and ibuprofen. These medicines can thin your blood. Do not take these medicines before your procedure if your doctor tells you not to.  Get all dental care that  you need done before your procedure. Plan to not have dental work done for 3 months after your surgery.  Follow instructions from your doctor about what you cannot eat or drink.  Ask your doctor how your surgical site will be marked or identified.  You may be given antibiotic medicine to help prevent infection.  If your doctor prescribes physical therapy, do exercises as told.  Do not use any tobacco products, such as cigarettes, chewing tobacco, or e-cigarettes. If you need help quitting, ask your doctor.  You may have a physical exam.  You may have tests, such as:  X-rays.  MRI.  CT scan.  Bone scans.  You may have a blood or urine sample taken.  Plan to have someone take you home after the procedure.  If you will be going home right after the procedure, plan to have someone with you for at least 24 hours. It is best to have someone help care for you for at least 4-6 weeks after surgery. What happens during the procedure?  To reduce your risk of infection:  Your health care team will wash or sanitize their hands.  Your skin will be washed with soap.  An IV tube will be put into one of your veins.  You will be given one or more of the following:  Sedative. This is a medicine that makes you relaxed.  Local anesthetic. This is a medicine  to numb the area.  General anesthetic. This is a medicine that makes you fall asleep.  Spinal anesthetic. This is a medicine that numbs your body below the waist.  Regional anesthetic. This is a medicine that numbs everything below the injection site.  A cut (incision) will be made in your knee.  Damaged parts of your thigh bone, lower leg bone, and kneecap will be removed.  A piece of metal (liner) will be placed on your thigh bone. Pieces of plastic will be placed on your lower leg bone and the underside of your kneecap.  One or more small tubes (drains) may be placed near your cut to help drain fluid.  Your cut will be  closed with stitches (sutures), skin glue, or skin tape (adhesive) strips. Medicine may be put on your cut.  A bandage (dressing) will be placed over your cut. The procedure may vary among doctors and hospitals. What happens after the procedure?  Your blood pressure, heart rate, breathing rate, and blood oxygen level will be monitored often until the medicines you were given have worn off.  You may continue to get fluids and medicines through an IV tube.  You will have some pain. There will be medicines to help you.  You may have fluid coming from a drain.  You may have to wear special socks (compression stockings). These help to prevent blood clots and reduce swelling in your legs.  You will be told to move around as much as possible.  You may be given a continuous passive motion machine to use at home. You will be shown how to use this machine.  Do not drive for 24 hours if you received a sedative. This information is not intended to replace advice given to you by your health care provider. Make sure you discuss any questions you have with your health care provider. Document Released: 10/27/2011 Document Revised: 04/07/2016 Document Reviewed: 07/11/2015 Elsevier Interactive Patient Education  2017 [employer]. Total Knee Replacement, Care After These instructions give you information about caring for yourself after your procedure. Your doctor may also give you more specific instructions. Call your doctor if you have any problems or questions after your procedure. Follow these instructions at home: Medicines  Take over-the-counter and prescription medicines only as told by your doctor.  If you were prescribed an antibiotic medicine, take it as told by your doctor. Do not stop taking the antibiotic even if you start to feel better.  If you were prescribed a blood thinner (anticoagulant), take it as told by your doctor. If you have a splint or brace:  Wear the splint or brace as  told by your doctor. Remove it only as told by your doctor.  Loosen the splint or brace if your toes tingle, get numb, or turn cold and blue.  Do not let your splint or brace get wet if it is not waterproof.  Keep the splint or brace clean. Bathing  Do not take baths, swim, or use a hot tub until your doctor says it is okay. Ask your doctor if you can take showers. You may only be allowed to take sponge baths for bathing.  If you have a splint or brace that is not waterproof, cover it with a watertight covering when you take a bath or a shower.  Keep your bandage (dressing) dry until your doctor says it can be taken off. Incision care and drain care  Check your cut from surgery (incision) and your drain every  day for signs of infection. Check for:  More redness, swelling, or pain.  More fluid or blood.  Warmth.  Pus or a bad smell.  Follow instructions from your doctor about how to take care of your cut from surgery. Make sure you:  Wash your hands with soap and water before you change your bandage. If you cannot use soap and water, use hand sanitizer.  Change your bandage as told by your doctor.  Leave stitches (sutures), skin glue, or skin tape (adhesive) strips in place. They may need to stay in place for 2 weeks or longer. If tape strips get loose and curl up, you may trim the loose edges. Do not remove tape strips completely unless your doctor says it is okay.  If you have a drain, follow instructions from your doctor about caring for it. Do not remove the drain tube or any bandages unless your doctor says it is okay. Managing pain, stiffness, and swelling  If directed, put ice on your knee.  Put ice in a plastic bag.  Place a towel between your skin and the bag.  Leave the ice on for 20 minutes, 2-3 times per day.  If directed, apply heat to the affected area as often as told by your doctor. Use the heat source that your doctor recommends, such as a moist heat pack  or a heating pad.  Place a towel between your skin and the heat source.  Leave the heat on for 20-30 minutes.  Remove the heat if your skin turns bright red. This is especially important if you are unable to feel pain, heat, or cold. You may have a greater risk of getting burned.  Move your toes often to avoid stiffness and to lessen swelling.  Raise (elevate) your knee above the level of your heart while you are sitting or lying down.  Wear elastic knee support for as long as told by your doctor. Driving  Do not drive until your doctor says it is okay. Ask your doctor when it is safe to drive if you have a splint or brace on your knee.  Do not drive or use heavy machinery while taking prescription pain medicine.  Do not drive for 24 hours if you received a sedative. Activity  Do not lift anything that is heavier than 10 lb (4.5 kg) until your doctor says it is okay.  Do not play contact sports until your doctor says it is okay.  Avoid high-impact activities, including running, jumping rope, and jumping jacks.  Avoid sitting for a long time without moving. Get up and move around at least every few hours.  If physical therapy was prescribed, do exercises as told by your doctor.  Return to your normal activities as told by your doctor. Ask your doctor what activities are safe for you. Safety  Do not use your leg to support your body weight until your doctor says that you can. Use crutches or a walker as told by your doctor. General instructions  Do not have any dental work done for at least 3 months after your surgery. When you do have dental work done, tell your dentist about your joint replacement.  Do not use any tobacco products, such as cigarettes, chewing tobacco, or e-cigarettes. If you need help quitting, ask your doctor.  Wear special socks (compression stockings) as told by your doctor.  If you have been sent home with a knee joint motion machine (continuous  passive motion machine), use it as  told by your doctor.  Drink enough fluid to keep your pee (urine) clear or pale yellow.  If you have been told to lose weight, follow instructions from your doctor about how to do this safely.  Keep all follow-up visits as told by your doctor. This is important. Contact a doctor if:  You have more redness, swelling, or pain around your cut from surgery or your drain.  You have more fluid or blood coming from your cut from surgery or your drain.  Your cut from surgery or your drain area feels warm to the touch.  You have pus or a bad smell coming from your cut from surgery or your drain.  You have a fever.  Your cut breaks open after your doctor removes your stitches, skin glue, or skin tape strips.  Your new joint feels loose.  You have knee pain that does not go away. Get help right away if:  You have a rash.  You have pain in your calf or thigh.  You have swelling in your calf or thigh.  You have shortness of breath.  You have trouble breathing.  You have chest pain.  Your ability to move your knee is getting worse. This information is not intended to replace advice given to you by your health care provider. Make sure you discuss any questions you have with your health care provider. Document Released: 10/27/2011 Document Revised: 04/07/2016 Document Reviewed: 07/11/2015 Elsevier Interactive Patient Education  2017 [employer].

## 2016-07-21 ENCOUNTER — Encounter (HOSPITAL_COMMUNITY)
Admission: RE | Admit: 2016-07-21 | Discharge: 2016-07-21 | Disposition: A | Discharge status: Home / Self Care | Payer: BLUE CROSS/BLUE SHIELD | Source: Ambulatory Visit | Attending: Orthopedic Surgery | Admitting: Orthopedic Surgery

## 2016-07-21 ENCOUNTER — Encounter (HOSPITAL_COMMUNITY): Payer: Self-pay

## 2016-07-21 DIAGNOSIS — Z01810 Encounter for preprocedural cardiovascular examination: Secondary | ICD-10-CM | POA: Insufficient documentation

## 2016-07-21 DIAGNOSIS — Z01812 Encounter for preprocedural laboratory examination: Secondary | ICD-10-CM

## 2016-07-21 LAB — BASIC METABOLIC PANEL
ANION GAP: 8 (ref 5–15)
BUN: 13 mg/dL (ref 6–20)
CO2: 23 mmol/L (ref 22–32)
Calcium: 9.2 mg/dL (ref 8.9–10.3)
Chloride: 105 mmol/L (ref 101–111)
Creatinine, Ser: 0.8 mg/dL (ref 0.61–1.24)
GFR calc Af Amer: >60 mL/min (ref >60)
GFR calc non Af Amer: >60 mL/min (ref >60)
GLUCOSE: 120 mg/dL — AB (ref 65–99)
POTASSIUM: 4.1 mmol/L (ref 3.5–5.1)
Sodium: 136 mmol/L (ref 135–145)

## 2016-07-21 LAB — CBC WITH DIFFERENTIAL/PLATELET
Basophils Absolute: 0 10*3/uL (ref 0.0–0.1)
Basophils Relative: 0 %
EOS PCT: 1 %
Eosinophils Absolute: 0 10*3/uL (ref 0.0–0.7)
HEMATOCRIT: 44.2 % (ref 39.0–52.0)
Hemoglobin: 14.7 g/dL (ref 13.0–17.0)
LYMPHS PCT: 22 %
Lymphs Abs: 1.7 10*3/uL (ref 0.7–4.0)
MCH: 30 pg (ref 26.0–34.0)
MCHC: 33.3 g/dL (ref 30.0–36.0)
MCV: 90.2 fL (ref 78.0–100.0)
MONO ABS: 0.4 10*3/uL (ref 0.1–1.0)
MONOS PCT: 5 %
NEUTROS ABS: 5.6 10*3/uL (ref 1.7–7.7)
Neutrophils Relative %: 72 %
PLATELETS: 261 10*3/uL (ref 150–400)
RBC: 4.9 MIL/uL (ref 4.22–5.81)
RDW: 14.9 % (ref 11.5–15.5)
WBC: 7.8 10*3/uL (ref 4.0–10.5)

## 2016-07-21 LAB — APTT: APTT: 33 s (ref 24–36)

## 2016-07-21 LAB — SURGICAL PCR SCREEN
MRSA, PCR: NEGATIVE
STAPHYLOCOCCUS AUREUS: NEGATIVE

## 2016-07-21 LAB — PROTIME-INR
INR: 0.95
Prothrombin Time: 12.7 seconds (ref 11.4–15.2)

## 2016-07-21 LAB — PREPARE RBC (CROSSMATCH)

## 2016-07-22 NOTE — Progress Notes (Signed)
Dr. Aline Brochure contacted about anticoagulant therapy.  A filter is not indicated for this procedure and regular anticoagulants will be given after surgery. Dr. Patsey Berthold aware.

## 2016-07-23 NOTE — H&P (Addendum)
TOTAL KNEE ADMISSION H&P  Patient is being admitted for left total knee arthroplasty.  Subjective:  Chief Complaint:left knee pain.  HPI: Willie Howe, 55 y.o. male, has a history of pain and functional disability in the left knee due to arthritis and has failed non-surgical conservative treatments for greater than 12 weeks to includeNSAID's and/or analgesics, corticosteriod injections, flexibility and strengthening excercises, supervised PT with diminished ADL's post treatment, weight reduction as appropriate and activity modification.  Onset of symptoms was gradual, starting 4-5 yrs  years ago with gradually worsening course since that time. The patient noted prior procedures on the knee to include  arthroscopy and menisectomy on the left knee(s).  Patient currently rates pain in the left knee(s) at 7 out of 10 with activity. Patient has night pain, worsening of pain with activity and weight bearing, pain that interferes with activities of daily living, pain with passive range of motion, crepitus and joint swelling.  Patient has evidence of subchondral cysts, subchondral sclerosis, periarticular osteophytes and joint space narrowing by imaging studies. There is no active infection.  (On June 2016 he had a right total knee with a size 4 femur 5 tibia 10 PS insert [place] stigma fixed-bearing with a 41 patella, on 8 2016 he had a manipulation under anesthesia and on September 2017 had arthroscopy of left knee)  Patient Active Problem List   Diagnosis Date Noted  . Torn medial meniscus   . Arthrofibrosis of total knee arthroplasty ([place])   . Arthritis of knee, right 02/02/2015  . Primary osteoarthritis of right knee   . Pulmonary embolus ([place]) 01/09/2015  . UTI (lower urinary tract infection) 02/21/2014  . Other headache syndrome 02/21/2014  . Nausea with vomiting 02/21/2014  . Rheumatoid arthritis ([place]) 02/21/2014  . Nausea & vomiting 02/21/2014  . Headache(784.0) 02/21/2014  . Lumbar spinal  stenosis 02/10/2014   Past Medical History:  Diagnosis Date  . Anginal pain ([place])   . Arthritis   . BPPV (benign paroxysmal positional vertigo)   . DVT (deep venous thrombosis) ([place])   . Dysrhythmia   . Graves disease 1996  . History of kidney stones   . History of pulmonary embolism   . Obesity   . Previous back surgery    x 3  . Rheumatoid arthritis ([place])   . Sleep apnea   . Thyroid disease    Graves Disease    Past Surgical History:  Procedure Laterality Date  . BACK SURGERY    . COLONOSCOPY    . EXAM UNDER ANESTHESIA WITH MANIPULATION OF KNEE Right 04/18/2015   Procedure: MANIPULATION OF RIGHT KNEE UNDER ANESTHESIA;  Surgeon: Carole Civil, MD;  Location: [facility];  Service: Orthopedics;  Laterality: Right;  . IVC Filter     removed December 2016  . [place]  2010  . KNEE ARTHROSCOPY Right 2006   meniscus   . KNEE ARTHROSCOPY WITH MEDIAL MENISECTOMY Left 04/24/2016   Procedure: LEFT KNEE ARTHROSCOPY WITH PARTIAL MEDIAL MENISECTOMY;  Surgeon: Carole Civil, MD;  Location: [facility];  Service: Orthopedics;  Laterality: Left;  . KNEE SURGERY     x 2  . KNEE SURGERY Left 1976   ?ligament repair   . LUMBAR WOUND DEBRIDEMENT N/A 02/23/2014   Procedure: LUMBAR WOUND DEBRIDEMENT;  Surgeon: Faythe Ghee, MD;  Location: [facility];  Service: Neurosurgery;  Laterality: N/A;  exploration lumbar wound. repair of dural defect.  Marland Kitchen MENISECTOMY Right    open medial   .  NEPHROLITHOTOMY    . PERIPHERAL VASCULAR CATHETERIZATION N/A 01/30/2015   Procedure: IVC Filter Insertion;  Surgeon: Serafina Mitchell, MD;  Location: [facility];  Service: Cardiovascular;  Laterality: N/A;  . PERIPHERAL VASCULAR CATHETERIZATION N/A 04/24/2015   Procedure: IVC Filter Removal;  Surgeon: Serafina Mitchell, MD;  Location: [facility];  Service: Cardiovascular;  Laterality: N/A;  . PERIPHERAL VASCULAR CATHETERIZATION N/A 08/14/2015   Procedure: IVC Filter Removal;  Surgeon: Serafina Mitchell, MD;  Location: [facility];  Service: Cardiovascular;  Laterality: N/A;  . SPINE SURGERY  2015   Dr Hal Neer Fusion   . SPINE SURGERY  1999   discectomy  . TOTAL KNEE ARTHROPLASTY Right 02/02/2015   Procedure:  RIGHT TOTAL KNEE ARTHROPLASTY;  Surgeon: Carole Civil, MD;  Location: [facility];  Service: Orthopedics;  Laterality: Right;  LM with pt's daughter of new arrival time (10:45)      No prescriptions prior to admission.   No Known Allergies  Social History  Substance Use Topics  . Smoking status: Former Smoker    Packs/day: 1.50    Years: 20.00    Types: Cigarettes    Quit date: 02/03/1995  . Smokeless tobacco: Never Used  . Alcohol use No    Family History  Problem Relation Age of Onset  . Deep vein thrombosis Mother   . Hypertension Mother   . Breast cancer Mother   . Hypertension Father   . Prostate cancer Father   . CAD Sister      Review of Systems  Respiratory: Positive for shortness of breath. Negative for cough, hemoptysis, sputum production and wheezing.   Cardiovascular: Positive for leg swelling. Negative for chest pain, palpitations, orthopnea and claudication.  Gastrointestinal: Negative.   Genitourinary: Negative.   Musculoskeletal: Positive for back pain.  All other systems reviewed and are negative.   Objective:  Physical Exam  Constitutional: He is oriented to person, place, and time. He appears well-developed and well-nourished. No distress.  HENT:  Head: Normocephalic and atraumatic.  Right Ear: External ear normal.  Left Ear: External ear normal.  Eyes: Conjunctivae and EOM are normal. Pupils are equal, round, and reactive to light. Right eye exhibits no discharge. Left eye exhibits no discharge. No scleral icterus.  Neck: Normal range of motion. Neck supple. No JVD present. No tracheal deviation present. No thyromegaly present.  Cardiovascular: Normal rate, regular rhythm and intact distal pulses.   Respiratory: Effort normal  and breath sounds normal. No stridor. No respiratory distress. He has no wheezes. He exhibits no tenderness.  GI: Soft. Bowel sounds are normal. He exhibits no distension and no mass. There is no tenderness.  Lymphadenopathy:    He has no cervical adenopathy.  Neurological: He is alert and oriented to person, place, and time. He has normal reflexes. He displays normal reflexes. No cranial nerve deficit. He exhibits normal muscle tone. Coordination normal.  Skin: Skin is warm and dry. No rash noted. He is not diaphoretic. No erythema. No pallor.  Psychiatric: He has a normal mood and affect. His behavior is normal. Judgment and thought content normal.   Right knee total knee incision anteriorly normal healing. Flexion 100. Mild flexion contracture. All ligament stable. Alignment normal. No instability in the anterior posterior coronal plane. Muscle tone and strength are normal  Upper extremities alignment is normal. There are no contractures. No joint subluxation atrophy or tremor skin is intact and normal pulses are good with good color  and capillary refill  Left knee well-healed portals. Mild effusion. Flexion limited with flexion contracture ligament stable alignment varus no instability. Skin is normal. Distal pulses are intact capillary refill and color normal sensation remains intact and normal as well.  Vital signs in last 24 hours:    Labs:   Estimated body mass index is 34.96 kg/m as calculated from the following:   Height as of 06/12/16: 6' 2.5" (1.892 m).   Weight as of 06/12/16: 276 lb (125.2 kg).   Imaging Review Plain radiographs demonstrate moderate degenerative joint disease of the left knee(s). The overall alignment ismild varus. The bone quality appears to be good for age and reported activity level.  Assessment/Plan: Willie Howe had a prior DVT and pulmonary embolus. He had a prior filter which was removed at the appropriate time per vascular and cardiothoracic  He  opts for routine postoperative anticoagulation with Coumadin.  This procedure has been fully reviewed with the patient and written informed consent has been obtained. The following discussion was entertained and completed with the patient   "You have decided to proceed with knee replacement surgery. You have decided not to continue with nonoperative measures such as but not limited to oral medication, weight loss, activity modification, physical therapy, bracing, or injection."  We will perform the procedure commonly known as total knee replacement. Some of the risks associated with knee replacement surgery include but are not limited to Bleeding Infection Swelling Stiffness Blood clot Pain that persists even after surgery  Infection is especially devastating complication of knee surgery although rare. If infection does occur your implant will usually have to be removed and several surgeries and antibiotics will be needed to eradicate the infection prior to performing a repeat replacement.   In some cases amputation is required to eradicate the infection. In other rare cases a knee fusion is needed    If you're not comfortable with these risks and would like to continue with nonoperative treatment please let Dr. Aline Brochure know prior to your surgery.  End stage arthritis, left knee    Left total knee arthroplasty  The patient history, physical examination, clinical judgment of the provider and imaging studies are consistent with end stage degenerative joint disease of the left knee(s) and total knee arthroplasty is deemed medically necessary. The treatment options including medical management, injection therapy arthroscopy and arthroplasty were discussed at length. The risks and benefits of total knee arthroplasty were presented and reviewed. The risks due to aseptic loosening, infection, stiffness, patella tracking problems, thromboembolic complications and other imponderables were  discussed. The patient acknowledged the explanation, agreed to proceed with the plan and consent was signed. Patient is being admitted for inpatient treatment for surgery, pain control, PT, OT, prophylactic antibiotics, VTE prophylaxis, progressive ambulation and ADL's and discharge planning. The patient is planning to be discharged home with home health services

## 2016-07-24 ENCOUNTER — Inpatient Hospital Stay (HOSPITAL_COMMUNITY): Payer: BLUE CROSS/BLUE SHIELD | Admitting: Anesthesiology

## 2016-07-24 ENCOUNTER — Inpatient Hospital Stay (HOSPITAL_COMMUNITY)
Admission: RE | Admit: 2016-07-24 | Discharge: 2016-07-27 | DRG: 470 | Disposition: A | Discharge status: Home / Self Care | Payer: BLUE CROSS/BLUE SHIELD | Source: Ambulatory Visit | Attending: Orthopedic Surgery | Admitting: Orthopedic Surgery

## 2016-07-24 ENCOUNTER — Encounter (HOSPITAL_COMMUNITY)
Admission: RE | Disposition: A | Discharge status: Home / Self Care | Payer: Self-pay | Source: Ambulatory Visit | Attending: Orthopedic Surgery

## 2016-07-24 ENCOUNTER — Encounter (HOSPITAL_COMMUNITY): Payer: Self-pay | Admitting: *Deleted

## 2016-07-24 ENCOUNTER — Inpatient Hospital Stay (HOSPITAL_COMMUNITY): Payer: BLUE CROSS/BLUE SHIELD

## 2016-07-24 DIAGNOSIS — M25562 Pain in left knee: Secondary | ICD-10-CM

## 2016-07-24 DIAGNOSIS — Z86718 Personal history of other venous thrombosis and embolism: Secondary | ICD-10-CM | POA: Diagnosis not present

## 2016-07-24 DIAGNOSIS — E0500 Thyrotoxicosis with diffuse goiter without thyrotoxic crisis or storm: Secondary | ICD-10-CM | POA: Diagnosis present

## 2016-07-24 DIAGNOSIS — R2689 Other abnormalities of gait and mobility: Secondary | ICD-10-CM

## 2016-07-24 DIAGNOSIS — Z87891 Personal history of nicotine dependence: Secondary | ICD-10-CM

## 2016-07-24 DIAGNOSIS — G4730 Sleep apnea, unspecified: Secondary | ICD-10-CM | POA: Diagnosis present

## 2016-07-24 DIAGNOSIS — Z6834 Body mass index (BMI) 34.0-34.9, adult: Secondary | ICD-10-CM | POA: Diagnosis not present

## 2016-07-24 DIAGNOSIS — Z9889 Other specified postprocedural states: Secondary | ICD-10-CM

## 2016-07-24 DIAGNOSIS — Z96651 Presence of right artificial knee joint: Secondary | ICD-10-CM | POA: Diagnosis present

## 2016-07-24 DIAGNOSIS — M069 Rheumatoid arthritis, unspecified: Secondary | ICD-10-CM | POA: Diagnosis present

## 2016-07-24 DIAGNOSIS — M659 Synovitis and tenosynovitis, unspecified: Secondary | ICD-10-CM | POA: Diagnosis present

## 2016-07-24 DIAGNOSIS — M1712 Unilateral primary osteoarthritis, left knee: Secondary | ICD-10-CM | POA: Diagnosis present

## 2016-07-24 DIAGNOSIS — E669 Obesity, unspecified: Secondary | ICD-10-CM | POA: Diagnosis present

## 2016-07-24 DIAGNOSIS — Z86711 Personal history of pulmonary embolism: Secondary | ICD-10-CM

## 2016-07-24 HISTORY — PX: TOTAL KNEE ARTHROPLASTY: SHX125

## 2016-07-24 LAB — CBC
HEMATOCRIT: 38.1 % — AB (ref 39.0–52.0)
Hemoglobin: 12.4 g/dL — ABNORMAL LOW (ref 13.0–17.0)
MCH: 29.7 pg (ref 26.0–34.0)
MCHC: 32.5 g/dL (ref 30.0–36.0)
MCV: 91.4 fL (ref 78.0–100.0)
PLATELETS: 210 10*3/uL (ref 150–400)
RBC: 4.17 MIL/uL — AB (ref 4.22–5.81)
RDW: 15.3 % (ref 11.5–15.5)
WBC: 14.5 10*3/uL — ABNORMAL HIGH (ref 4.0–10.5)

## 2016-07-24 LAB — CREATININE, SERUM
Creatinine, Ser: 0.84 mg/dL (ref 0.61–1.24)
GFR calc Af Amer: >60 mL/min (ref >60)
GFR calc non Af Amer: >60 mL/min (ref >60)

## 2016-07-24 LAB — GLUCOSE, CAPILLARY
Glucose-Capillary: 126 mg/dL — ABNORMAL HIGH (ref 65–99)
Glucose-Capillary: 158 mg/dL — ABNORMAL HIGH (ref 65–99)

## 2016-07-24 SURGERY — ARTHROPLASTY, KNEE, TOTAL
Anesthesia: General | Site: Knee | Laterality: Left

## 2016-07-24 MED ORDER — FOLIC ACID 1 MG PO TABS
1.0000 mg | ORAL_TABLET | Freq: Every day | ORAL | Status: DC
  Administered 2016-07-25 – 2016-07-27 (×3): 1 mg via ORAL
  Filled 2016-07-24 (×4): qty 1

## 2016-07-24 MED ORDER — BUPIVACAINE LIPOSOME 1.3 % IJ SUSP
20.0000 mL | Freq: Once | INTRAMUSCULAR | Status: DC
  Filled 2016-07-24: qty 20

## 2016-07-24 MED ORDER — FENTANYL CITRATE (PF) 100 MCG/2ML IJ SOLN
INTRAMUSCULAR | Status: DC | PRN
  Administered 2016-07-24 (×2): 100 ug via INTRAVENOUS
  Administered 2016-07-24 (×4): 50 ug via INTRAVENOUS
  Administered 2016-07-24: 100 ug via INTRAVENOUS
  Administered 2016-07-24: 50 ug via INTRAVENOUS

## 2016-07-24 MED ORDER — POLYETHYLENE GLYCOL 3350 17 G PO PACK
17.0000 g | PACK | Freq: Every day | ORAL | Status: DC
  Administered 2016-07-26 – 2016-07-27 (×2): 17 g via ORAL
  Filled 2016-07-24 (×3): qty 1

## 2016-07-24 MED ORDER — SODIUM CHLORIDE 0.9 % IJ SOLN
INTRAMUSCULAR | Status: AC
  Filled 2016-07-24: qty 40

## 2016-07-24 MED ORDER — PREDNISONE 10 MG PO TABS
5.0000 mg | ORAL_TABLET | Freq: Every day | ORAL | Status: DC
  Administered 2016-07-25 – 2016-07-27 (×3): 5 mg via ORAL
  Filled 2016-07-24 (×3): qty 1

## 2016-07-24 MED ORDER — ONDANSETRON HCL 4 MG/2ML IJ SOLN
4.0000 mg | Freq: Once | INTRAMUSCULAR | Status: AC
  Administered 2016-07-24: 4 mg via INTRAVENOUS
  Filled 2016-07-24: qty 2

## 2016-07-24 MED ORDER — ROCURONIUM BROMIDE 100 MG/10ML IV SOLN
INTRAVENOUS | Status: DC | PRN
  Administered 2016-07-24: 60 mg via INTRAVENOUS

## 2016-07-24 MED ORDER — METFORMIN HCL 500 MG PO TABS
500.0000 mg | ORAL_TABLET | Freq: Every day | ORAL | Status: DC
  Administered 2016-07-25 – 2016-07-27 (×3): 500 mg via ORAL
  Filled 2016-07-24 (×3): qty 1

## 2016-07-24 MED ORDER — LIDOCAINE HCL (CARDIAC) 20 MG/ML IV SOLN
INTRAVENOUS | Status: DC | PRN
  Administered 2016-07-24: 50 mg via INTRAVENOUS

## 2016-07-24 MED ORDER — HYDROMORPHONE HCL 1 MG/ML IJ SOLN
0.2500 mg | INTRAMUSCULAR | Status: DC | PRN
  Administered 2016-07-24: 0.5 mg via INTRAVENOUS
  Administered 2016-07-24: 0.25 mg via INTRAVENOUS
  Administered 2016-07-24 (×2): 0.5 mg via INTRAVENOUS
  Administered 2016-07-24: 0.25 mg via INTRAVENOUS
  Administered 2016-07-24: 0.5 mg via INTRAVENOUS
  Filled 2016-07-24 (×5): qty 0.5

## 2016-07-24 MED ORDER — METHOCARBAMOL 500 MG PO TABS: 500.0000 mg | ORAL_TABLET | Freq: Four times a day (QID) | ORAL | Status: DC | PRN

## 2016-07-24 MED ORDER — SUGAMMADEX SODIUM 500 MG/5ML IV SOLN
INTRAVENOUS | Status: AC
  Filled 2016-07-24: qty 5

## 2016-07-24 MED ORDER — LIDOCAINE HCL (PF) 1 % IJ SOLN
INTRAMUSCULAR | Status: AC
  Filled 2016-07-24: qty 5

## 2016-07-24 MED ORDER — MENTHOL 3 MG MT LOZG: 1.0000 | LOZENGE | OROMUCOSAL | Status: DC | PRN

## 2016-07-24 MED ORDER — ONDANSETRON HCL 4 MG/2ML IJ SOLN: 4.0000 mg | Freq: Four times a day (QID) | INTRAMUSCULAR | Status: DC | PRN

## 2016-07-24 MED ORDER — OXYCODONE HCL 5 MG PO TABS
5.0000 mg | ORAL_TABLET | Freq: Once | ORAL | Status: AC
  Administered 2016-07-24: 5 mg via ORAL
  Filled 2016-07-24: qty 1

## 2016-07-24 MED ORDER — PROPOFOL 10 MG/ML IV BOLUS
INTRAVENOUS | Status: DC | PRN
  Administered 2016-07-24: 250 mg via INTRAVENOUS

## 2016-07-24 MED ORDER — HYDROMORPHONE HCL 1 MG/ML IJ SOLN: 0.5000 mg | INTRAMUSCULAR | Status: DC | PRN

## 2016-07-24 MED ORDER — GLYCOPYRROLATE 0.2 MG/ML IJ SOLN
INTRAMUSCULAR | Status: AC
  Filled 2016-07-24: qty 4

## 2016-07-24 MED ORDER — MAGNESIUM CITRATE PO SOLN: 1.0000 | Freq: Once | ORAL | Status: DC | PRN

## 2016-07-24 MED ORDER — HYDROMORPHONE HCL 1 MG/ML IJ SOLN
INTRAMUSCULAR | Status: AC
  Filled 2016-07-24: qty 0.5

## 2016-07-24 MED ORDER — PREGABALIN 50 MG PO CAPS
50.0000 mg | ORAL_CAPSULE | Freq: Once | ORAL | Status: AC
  Administered 2016-07-24: 50 mg via ORAL
  Filled 2016-07-24: qty 1

## 2016-07-24 MED ORDER — 0.9 % SODIUM CHLORIDE (POUR BTL) OPTIME
TOPICAL | Status: DC | PRN
  Administered 2016-07-24: 1000 mL

## 2016-07-24 MED ORDER — METOCLOPRAMIDE HCL 10 MG PO TABS: 5.0000 mg | ORAL_TABLET | Freq: Three times a day (TID) | ORAL | Status: DC | PRN

## 2016-07-24 MED ORDER — GLYCOPYRROLATE 0.2 MG/ML IJ SOLN
INTRAMUSCULAR | Status: DC | PRN
  Administered 2016-07-24: .8 mg via INTRAVENOUS

## 2016-07-24 MED ORDER — GABAPENTIN 300 MG PO CAPS
300.0000 mg | ORAL_CAPSULE | Freq: Three times a day (TID) | ORAL | Status: DC
  Administered 2016-07-24 – 2016-07-27 (×9): 300 mg via ORAL
  Filled 2016-07-24 (×9): qty 1

## 2016-07-24 MED ORDER — FENTANYL CITRATE (PF) 100 MCG/2ML IJ SOLN
INTRAMUSCULAR | Status: AC
  Filled 2016-07-24: qty 2

## 2016-07-24 MED ORDER — HYDROMORPHONE BOLUS VIA INFUSION
0.5000 mg | INTRAVENOUS | Status: DC | PRN
  Administered 2016-07-24: 0.5 mg via INTRAVENOUS

## 2016-07-24 MED ORDER — CEFAZOLIN SODIUM-DEXTROSE 2-4 GM/100ML-% IV SOLN
2.0000 g | Freq: Four times a day (QID) | INTRAVENOUS | Status: AC
  Administered 2016-07-24 (×2): 2 g via INTRAVENOUS
  Filled 2016-07-24 (×2): qty 100

## 2016-07-24 MED ORDER — SODIUM CHLORIDE 0.9 % IJ SOLN
INTRAMUSCULAR | Status: AC
  Filled 2016-07-24: qty 20

## 2016-07-24 MED ORDER — MIDAZOLAM HCL 2 MG/2ML IJ SOLN
1.0000 mg | INTRAMUSCULAR | Status: DC | PRN
  Administered 2016-07-24 (×2): 2 mg via INTRAVENOUS
  Filled 2016-07-24 (×2): qty 2

## 2016-07-24 MED ORDER — DOCUSATE SODIUM 100 MG PO CAPS
200.0000 mg | ORAL_CAPSULE | Freq: Every day | ORAL | Status: DC
  Administered 2016-07-24 – 2016-07-27 (×4): 200 mg via ORAL
  Filled 2016-07-24 (×4): qty 2

## 2016-07-24 MED ORDER — ENOXAPARIN SODIUM 30 MG/0.3ML ~~LOC~~ SOLN
30.0000 mg | Freq: Two times a day (BID) | SUBCUTANEOUS | Status: AC
  Administered 2016-07-25: 30 mg via SUBCUTANEOUS
  Filled 2016-07-24: qty 0.3

## 2016-07-24 MED ORDER — WARFARIN - PHYSICIAN DOSING INPATIENT
Freq: Every day | Status: DC
Start: 2016-07-24 — End: 2016-07-27

## 2016-07-24 MED ORDER — EPHEDRINE SULFATE 50 MG/ML IJ SOLN
INTRAMUSCULAR | Status: DC | PRN
  Administered 2016-07-24: 10 mg via INTRAVENOUS
  Administered 2016-07-24: 15 mg via INTRAVENOUS

## 2016-07-24 MED ORDER — SODIUM CHLORIDE 0.9 % IV SOLN
INTRAVENOUS | Status: DC
  Administered 2016-07-24 – 2016-07-25 (×2): via INTRAVENOUS

## 2016-07-24 MED ORDER — SODIUM CHLORIDE 0.9 % IJ SOLN
INTRAMUSCULAR | Status: AC
  Filled 2016-07-24: qty 10

## 2016-07-24 MED ORDER — SODIUM CHLORIDE 0.9 % IV SOLN
INTRAVENOUS | Status: DC | PRN
  Administered 2016-07-24: 60 mL

## 2016-07-24 MED ORDER — PHENOL 1.4 % MT LIQD: 1.0000 | OROMUCOSAL | Status: DC | PRN

## 2016-07-24 MED ORDER — FENTANYL CITRATE (PF) 250 MCG/5ML IJ SOLN
INTRAMUSCULAR | Status: AC
  Filled 2016-07-24: qty 5

## 2016-07-24 MED ORDER — CEFAZOLIN SODIUM-DEXTROSE 2-4 GM/100ML-% IV SOLN
2.0000 g | INTRAVENOUS | Status: AC
  Administered 2016-07-24: 2 g via INTRAVENOUS
  Filled 2016-07-24: qty 100

## 2016-07-24 MED ORDER — ACETAMINOPHEN 325 MG PO TABS: 650.0000 mg | ORAL_TABLET | Freq: Four times a day (QID) | ORAL | Status: DC | PRN

## 2016-07-24 MED ORDER — ONDANSETRON HCL 4 MG PO TABS: 4.0000 mg | ORAL_TABLET | Freq: Four times a day (QID) | ORAL | Status: DC | PRN

## 2016-07-24 MED ORDER — PROPOFOL 10 MG/ML IV BOLUS
INTRAVENOUS | Status: AC
  Filled 2016-07-24: qty 20

## 2016-07-24 MED ORDER — DEXTROSE 5 % IV SOLN: 500.0000 mg | Freq: Four times a day (QID) | INTRAVENOUS | Status: DC | PRN

## 2016-07-24 MED ORDER — CELECOXIB 100 MG PO CAPS
200.0000 mg | ORAL_CAPSULE | Freq: Two times a day (BID) | ORAL | Status: DC
  Administered 2016-07-25 – 2016-07-27 (×5): 200 mg via ORAL
  Filled 2016-07-24 (×6): qty 2

## 2016-07-24 MED ORDER — CHLORHEXIDINE GLUCONATE 4 % EX LIQD: 60.0000 mL | Freq: Once | CUTANEOUS | Status: DC

## 2016-07-24 MED ORDER — DIPHENHYDRAMINE HCL 12.5 MG/5ML PO ELIX: 12.5000 mg | ORAL_SOLUTION | ORAL | Status: DC | PRN

## 2016-07-24 MED ORDER — TAMSULOSIN HCL 0.4 MG PO CAPS
0.4000 mg | ORAL_CAPSULE | Freq: Every day | ORAL | Status: DC
  Administered 2016-07-25 – 2016-07-27 (×3): 0.4 mg via ORAL
  Filled 2016-07-24 (×3): qty 1

## 2016-07-24 MED ORDER — METHOCARBAMOL 1000 MG/10ML IJ SOLN
500.0000 mg | Freq: Once | INTRAVENOUS | Status: AC
  Administered 2016-07-24: 500 mg via INTRAVENOUS
  Filled 2016-07-24: qty 5

## 2016-07-24 MED ORDER — BUPIVACAINE-EPINEPHRINE (PF) 0.5% -1:200000 IJ SOLN
INTRAMUSCULAR | Status: DC | PRN
  Administered 2016-07-24: 30 mL

## 2016-07-24 MED ORDER — DEXAMETHASONE SODIUM PHOSPHATE 10 MG/ML IJ SOLN
10.0000 mg | Freq: Once | INTRAMUSCULAR | Status: DC
  Filled 2016-07-24: qty 1

## 2016-07-24 MED ORDER — EPHEDRINE SULFATE 50 MG/ML IJ SOLN
INTRAMUSCULAR | Status: AC
  Filled 2016-07-24: qty 1

## 2016-07-24 MED ORDER — BUPIVACAINE LIPOSOME 1.3 % IJ SUSP
INTRAMUSCULAR | Status: AC
  Filled 2016-07-24: qty 20

## 2016-07-24 MED ORDER — CELECOXIB 400 MG PO CAPS
400.0000 mg | ORAL_CAPSULE | Freq: Once | ORAL | Status: AC
  Administered 2016-07-24: 400 mg via ORAL
  Filled 2016-07-24: qty 1

## 2016-07-24 MED ORDER — PHENYLEPHRINE HCL 10 MG/ML IJ SOLN
INTRAMUSCULAR | Status: DC | PRN
  Administered 2016-07-24 (×2): 100 ug via INTRAVENOUS

## 2016-07-24 MED ORDER — NEOSTIGMINE METHYLSULFATE 10 MG/10ML IV SOLN
INTRAVENOUS | Status: DC | PRN
  Administered 2016-07-24: 5 mg via INTRAVENOUS

## 2016-07-24 MED ORDER — PROPOFOL 10 MG/ML IV BOLUS
INTRAVENOUS | Status: AC
Start: 2016-07-24 — End: 2016-07-24
  Filled 2016-07-24: qty 60

## 2016-07-24 MED ORDER — DEXAMETHASONE SODIUM PHOSPHATE 4 MG/ML IJ SOLN
4.0000 mg | Freq: Once | INTRAMUSCULAR | Status: AC
  Administered 2016-07-24: 4 mg via INTRAVENOUS
  Filled 2016-07-24: qty 1

## 2016-07-24 MED ORDER — ACETAMINOPHEN 650 MG RE SUPP
650.0000 mg | Freq: Four times a day (QID) | RECTAL | Status: DC | PRN
  Filled 2016-07-24: qty 1

## 2016-07-24 MED ORDER — HYDROMORPHONE HCL 1 MG/ML IJ SOLN
1.0000 mg | INTRAMUSCULAR | Status: DC | PRN
  Administered 2016-07-24 – 2016-07-27 (×13): 1 mg via INTRAVENOUS
  Filled 2016-07-24 (×13): qty 1

## 2016-07-24 MED ORDER — WARFARIN SODIUM 5 MG PO TABS
5.0000 mg | ORAL_TABLET | Freq: Once | ORAL | Status: AC
  Administered 2016-07-24: 5 mg via ORAL
  Filled 2016-07-24: qty 1

## 2016-07-24 MED ORDER — PHENYLEPHRINE HCL 10 MG/ML IJ SOLN
INTRAMUSCULAR | Status: AC
  Filled 2016-07-24: qty 1

## 2016-07-24 MED ORDER — LORATADINE 10 MG PO TABS: 10.0000 mg | ORAL_TABLET | Freq: Every day | ORAL | Status: DC | PRN

## 2016-07-24 MED ORDER — ALUM & MAG HYDROXIDE-SIMETH 200-200-20 MG/5ML PO SUSP: 30.0000 mL | ORAL | Status: DC | PRN

## 2016-07-24 MED ORDER — ACETAMINOPHEN 10 MG/ML IV SOLN
1000.0000 mg | Freq: Once | INTRAVENOUS | Status: AC
  Administered 2016-07-24: 1000 mg via INTRAVENOUS
  Filled 2016-07-24: qty 100

## 2016-07-24 MED ORDER — SODIUM CHLORIDE 0.9 % IR SOLN
Status: DC | PRN
  Administered 2016-07-24: 3000 mL

## 2016-07-24 MED ORDER — METOCLOPRAMIDE HCL 5 MG/ML IJ SOLN: 5.0000 mg | Freq: Three times a day (TID) | INTRAMUSCULAR | Status: DC | PRN

## 2016-07-24 MED ORDER — LACTATED RINGERS IV SOLN
INTRAVENOUS | Status: DC
  Administered 2016-07-24 (×3): via INTRAVENOUS

## 2016-07-24 MED ORDER — NEOSTIGMINE METHYLSULFATE 10 MG/10ML IV SOLN
INTRAVENOUS | Status: AC
  Filled 2016-07-24: qty 1

## 2016-07-24 SURGICAL SUPPLY — 69 items
BAG HAMPER (MISCELLANEOUS) ×2 IMPLANT
BANDAGE ESMARK 6X9 LF (GAUZE/BANDAGES/DRESSINGS) ×1 IMPLANT
BIT DRILL 3.2X128 (BIT) IMPLANT
BLADE HEX COATED 2.75 (ELECTRODE) ×2 IMPLANT
BLADE SAGITTAL 25.0X1.27X90 (BLADE) ×2 IMPLANT
BNDG ESMARK 6X9 LF (GAUZE/BANDAGES/DRESSINGS) ×2
BOWL SMART MIX CTS (DISPOSABLE) IMPLANT
CAP KNEE TOTAL 3 SIGMA ×2 IMPLANT
CEMENT HV SMART SET (Cement) ×4 IMPLANT
CLOTH BEACON ORANGE TIMEOUT ST (SAFETY) ×2 IMPLANT
COOLER CRYO CUFF IC AND MOTOR (MISCELLANEOUS) ×2 IMPLANT
COVER LIGHT HANDLE STERIS (MISCELLANEOUS) ×4 IMPLANT
CUFF CRYO KNEE LG 20X31 COOLER (ORTHOPEDIC SUPPLIES) ×2 IMPLANT
CUFF CRYO KNEE18X23 MED (MISCELLANEOUS) IMPLANT
CUFF TOURNIQUET SINGLE 34IN LL (TOURNIQUET CUFF) ×2 IMPLANT
DECANTER SPIKE VIAL GLASS SM (MISCELLANEOUS) ×2 IMPLANT
DRAPE BACK TABLE (DRAPES) ×2 IMPLANT
DRAPE EXTREMITY T 121X128X90 (DRAPE) ×2 IMPLANT
DRESSING AQUACEL AG ADV 3.5X12 (MISCELLANEOUS) ×1 IMPLANT
DRESSING MEPILEX BORDER 6X8 (GAUZE/BANDAGES/DRESSINGS) ×1 IMPLANT
DRSG AQUACEL AG ADV 3.5X12 (MISCELLANEOUS) ×2
DRSG MEPILEX BORDER 4X12 (GAUZE/BANDAGES/DRESSINGS) ×2 IMPLANT
DRSG MEPILEX BORDER 6X8 (GAUZE/BANDAGES/DRESSINGS) ×2
DURAPREP 26ML APPLICATOR (WOUND CARE) ×4 IMPLANT
ELECT REM PT RETURN 9FT ADLT (ELECTROSURGICAL) ×2
ELECTRODE REM PT RTRN 9FT ADLT (ELECTROSURGICAL) ×1 IMPLANT
EVACUATOR 3/16  PVC DRAIN (DRAIN) ×1
EVACUATOR 3/16 PVC DRAIN (DRAIN) ×1 IMPLANT
GLOVE BIO SURGEON STRL SZ7 (GLOVE) ×4 IMPLANT
GLOVE BIOGEL PI IND STRL 7.0 (GLOVE) ×2 IMPLANT
GLOVE BIOGEL PI INDICATOR 7.0 (GLOVE) ×2
GLOVE SKINSENSE NS SZ8.0 LF (GLOVE) ×2
GLOVE SKINSENSE STRL SZ8.0 LF (GLOVE) ×2 IMPLANT
GLOVE SS N UNI LF 8.5 STRL (GLOVE) ×2 IMPLANT
GOWN STRL REUS W/ TWL LRG LVL3 (GOWN DISPOSABLE) ×1 IMPLANT
GOWN STRL REUS W/TWL LRG LVL3 (GOWN DISPOSABLE) ×5 IMPLANT
GOWN STRL REUS W/TWL XL LVL3 (GOWN DISPOSABLE) ×2 IMPLANT
HANDPIECE INTERPULSE COAX TIP (DISPOSABLE) ×1
HOOD W/PEELAWAY (MISCELLANEOUS) ×8 IMPLANT
INST SET MAJOR BONE (KITS) ×2 IMPLANT
IV NS IRRIG 3000ML ARTHROMATIC (IV SOLUTION) ×2 IMPLANT
KIT BLADEGUARD II DBL (SET/KITS/TRAYS/PACK) ×2 IMPLANT
KIT ROOM TURNOVER APOR (KITS) ×2 IMPLANT
MANIFOLD NEPTUNE II (INSTRUMENTS) ×2 IMPLANT
MARKER SKIN DUAL TIP RULER LAB (MISCELLANEOUS) ×2 IMPLANT
NEEDLE HYPO 21X1.5 SAFETY (NEEDLE) ×4 IMPLANT
NS IRRIG 1000ML POUR BTL (IV SOLUTION) ×2 IMPLANT
PACK TOTAL JOINT (CUSTOM PROCEDURE TRAY) ×2 IMPLANT
PAD ARMBOARD 7.5X6 YLW CONV (MISCELLANEOUS) ×2 IMPLANT
PAD DANNIFLEX CPM (ORTHOPEDIC SUPPLIES) ×2 IMPLANT
PIN TROCAR 3 INCH (PIN) ×2 IMPLANT
SAW OSC TIP CART 19.5X105X1.3 (SAW) ×2 IMPLANT
SET BASIN LINEN APH (SET/KITS/TRAYS/PACK) ×2 IMPLANT
SET HNDPC FAN SPRY TIP SCT (DISPOSABLE) ×1 IMPLANT
STAPLER VISISTAT 35W (STAPLE) ×2 IMPLANT
SUT BRALON NAB BRD #1 30IN (SUTURE) ×4 IMPLANT
SUT MNCRL 0 VIOLET CTX 36 (SUTURE) ×1 IMPLANT
SUT MON AB 0 CT1 (SUTURE) ×2 IMPLANT
SUT MON AB 2-0 CT1 36 (SUTURE) ×2 IMPLANT
SUT MONOCRYL 0 CTX 36 (SUTURE) ×1
SYR 20CC LL (SYRINGE) ×6 IMPLANT
SYR 30ML LL (SYRINGE) ×2 IMPLANT
SYR BULB IRRIGATION 50ML (SYRINGE) ×2 IMPLANT
TOWEL OR 17X26 4PK STRL BLUE (TOWEL DISPOSABLE) ×2 IMPLANT
TOWER CARTRIDGE SMART MIX (DISPOSABLE) ×2 IMPLANT
TRAY FOLEY W/METER SILVER 16FR (SET/KITS/TRAYS/PACK) ×2 IMPLANT
WATER STERILE IRR 1000ML POUR (IV SOLUTION) ×4 IMPLANT
YANKAUER SUCT 12FT TUBE ARGYLE (SUCTIONS) ×2 IMPLANT
YANKAUER SUCT BULB TIP NO VENT (SUCTIONS) ×2 IMPLANT

## 2016-07-24 NOTE — Transfer of Care (Signed)
Immediate Anesthesia Transfer of Care Note  Patient: Willie Howe  Procedure(s) Performed: Procedure(s): TOTAL KNEE ARTHROPLASTY (Left)  Patient Location: PACU  Anesthesia Type:General  Level of Consciousness: awake, alert , oriented and patient cooperative  Airway & Oxygen Therapy: Patient Spontanous Breathing, Patient connected to nasal cannula oxygen and Patient connected to face mask oxygen  Post-op Assessment: Report given to RN and Post -op Vital signs reviewed and stable  Post vital signs: Reviewed and stable  Last Vitals:  Vitals:   07/24/16 0720 07/24/16 0725  BP: 131/87 120/81  Resp: 12 18  Temp:      Last Pain:  Vitals:   07/24/16 0626  TempSrc: Oral  PainSc: 7       Patients Stated Pain Goal: 7 ([id number])  Complications: No apparent anesthesia complications

## 2016-07-24 NOTE — Anesthesia Preprocedure Evaluation (Addendum)
Anesthesia Evaluation  Patient identified by MRN, date of birth, ID band Patient awake    Reviewed: Allergy & Precautions, NPO status , Patient's Chart, lab work & pertinent test results  Airway Mallampati: II  TM Distance: >3 FB     Dental  (+) Partial Upper, Dental Advisory Given, Teeth Intact   Pulmonary sleep apnea , former smoker, PE   breath sounds clear to auscultation       Cardiovascular (-) angina+ DVT (Pulm embolus after back surgery. IVC filter placed and removed. Cardiology to anticoagulate after surgery. )  + dysrhythmias (hx irreg HR, neg workup by cardiologist)  Rhythm:Regular Rate:Normal     Neuro/Psych  Headaches,    GI/Hepatic negative GI ROS,   Endo/Other  Hyperthyroidism (euthyroid now)   Renal/GU      Musculoskeletal  (+) Arthritis , Rheumatoid disorders,    Abdominal   Peds  Hematology   Anesthesia Other Findings   Reproductive/Obstetrics                            Anesthesia Physical Anesthesia Plan  ASA: III  Anesthesia Plan: General   Post-op Pain Management:    Induction: Intravenous  Airway Management Planned: Oral ETT  Additional Equipment:   Intra-op Plan:   Post-operative Plan: Extubation in OR  Informed Consent: I have reviewed the patients History and Physical, chart, labs and discussed the procedure including the risks, benefits and alternatives for the proposed anesthesia with the patient or authorized representative who has indicated his/her understanding and acceptance.     Plan Discussed with:   Anesthesia Plan Comments:         Anesthesia Quick Evaluation

## 2016-07-24 NOTE — Interval H&P Note (Signed)
History and Physical Interval Note:  07/24/2016 7:19 AM BP 123/76   Temp 98 F (36.7 C) (Oral)   Resp (!) 24   Ht 6' 2.5" (1.892 m)   Wt 276 lb (125.2 kg)   SpO2 98%   BMI 34.96 kg/m   Skin area clean for sugrery  Willie Howe  has presented today for surgery, with the diagnosis of LEFT KNEE ARTHRITIS  The various methods of treatment have been discussed with the patient and family. After consideration of risks, benefits and other options for treatment, the patient has consented to  Procedure(s): TOTAL KNEE ARTHROPLASTY (Left) as a surgical intervention .  The patient's history has been reviewed, patient examined, no change in status, stable for surgery.  I have reviewed the patient's chart and labs.  Questions were answered to the patient's satisfaction.     Arther Abbott

## 2016-07-24 NOTE — Evaluation (Signed)
Physical Therapy Evaluation Patient Details Name: JESHAUN JEANPIERRE MRN: [id number] DOB: 1960-12-08 Today's Date: 07/24/2016   History of Present Illness  55 yo M admitted for elective L TKA.   Clinical Impression  Pt received in bed, wife dtr, and friend present, and pt was agreeable to PT evaluation.  Family stepped out during mobility assessment.  Pt expressed that he was using a cane for ambulation prior to admission, however he was independent with all ADL's.  During PT evaluation, he demonstrated good technique with supine exercises, and was modified independent with transfer supine<>sit.  He required Min guard for transfer sit<>stand with RW, and was able to ambulate 70ft with RW and step through pattern.  He is recommended to f/u with OPPT upon d/c.  He is determined to regain strength and AROM and wants to avoid having a manipulation like last time.     Follow Up Recommendations Outpatient PT    Equipment Recommendations  None recommended by PT    Recommendations for Other Services       Precautions / Restrictions Precautions Precautions: Fall Precaution Comments: Pt reports falling out of the bed Restrictions Weight Bearing Restrictions: No      Mobility  Bed Mobility Overal bed mobility: Modified Independent                Transfers Overall transfer level: Needs assistance Equipment used: Rolling walker (2 wheeled) Transfers: Sit to/from Stand Sit to Stand: Min guard            Ambulation/Gait Ambulation/Gait assistance: Min guard Ambulation Distance (Feet): 20 Feet Assistive device: Rolling walker (2 wheeled) Gait Pattern/deviations: Step-through pattern        Stairs            Wheelchair Mobility    Modified Rankin (Stroke Patients Only)       Balance Overall balance assessment: Needs assistance         Standing balance support: Bilateral upper extremity supported Standing balance-Leahy Scale: Poor                                Pertinent Vitals/Pain Pain Assessment: 0-10 Pain Score: 4  Pain Location: L knee Pain Descriptors / Indicators: Aching Pain Intervention(s): Limited activity within patient's tolerance;Monitored during session;Repositioned    Home Living Family/patient expects to be discharged to:: Private residence Living Arrangements: Spouse/significant other Available Help at Discharge: Available 24 hours/day Type of Home: House Home Access: Stairs to enter   [employer] of Steps: 5 with HR Home Layout: One level Home Equipment: Walker - 2 wheels;Cane - single point;Bedside commode      Prior Function Level of Independence: Independent with assistive device(s)   Gait / Transfers Assistance Needed: using cane for ambulation  ADL's / Homemaking Assistance Needed: independent with ADL's.         Hand Dominance   Dominant Hand: Right    Extremity/Trunk Assessment   Upper Extremity Assessment: Overall WFL for tasks assessed           Lower Extremity Assessment: LLE deficits/detail;RLE deficits/detail RLE Deficits / Details: WFL LLE Deficits / Details: Grossly 3/5     Communication   Communication: No difficulties  Cognition Arousal/Alertness: Awake/alert Behavior During Therapy: WFL for tasks assessed/performed Overall Cognitive Status: Within Functional Limits for tasks assessed                      General Comments  Exercises Total Joint Exercises Ankle Circles/Pumps: AROM;Both;10 reps;Supine Quad Sets: Strengthening;Both;10 reps;Supine Gluteal Sets: Strengthening;Both;10 reps;Supine Short Arc Quad: Strengthening;Left;10 reps;Supine Heel Slides: AROM;Left;10 reps;Supine   Assessment/Plan    PT Assessment Patient needs continued PT services  PT Problem List Decreased strength;Decreased range of motion;Decreased activity tolerance;Decreased balance;Decreased mobility;Decreased knowledge of use of DME;Decreased safety  awareness;Decreased knowledge of precautions;Pain;Decreased skin integrity          PT Treatment Interventions DME instruction;Gait training;Stair training;Functional mobility training;Therapeutic activities;Therapeutic exercise;Balance training;Patient/family education    PT Goals (Current goals can be found in the Care Plan section)  Acute Rehab PT Goals Patient Stated Goal: To get stronger and be more active to be able to loose weight.  PT Goal Formulation: With patient Time For Goal Achievement: 07/31/16 Potential to Achieve Goals: Good    Frequency BID   Barriers to discharge        Co-evaluation               End of Session Equipment Utilized During Treatment: Gait belt Activity Tolerance: Patient tolerated treatment well Patient left: in chair;with call bell/phone within reach;with family/visitor present Nurse Communication: Mobility status Apolonio Schneiders, RN notified of pt's mobiltiy status, and mobiltiy sheet left in the room. )         Time: [id number] PT Time Calculation (min) (ACUTE ONLY): 36 min   Charges:   PT Evaluation $PT Eval Low Complexity: 1 Procedure PT Treatments $Gait Training: 8-22 mins   PT G Codes:        Beth Diego Delancey, PT, DPT X: [phone]

## 2016-07-24 NOTE — Anesthesia Postprocedure Evaluation (Signed)
Anesthesia Post Note  Patient: Willie Howe  Procedure(s) Performed: Procedure(s) (LRB): TOTAL KNEE ARTHROPLASTY (Left)  Patient location during evaluation: PACU Anesthesia Type: General Level of consciousness: awake Pain management: pain level controlled Vital Signs Assessment: post-procedure vital signs reviewed and stable Respiratory status: spontaneous breathing Cardiovascular status: stable Anesthetic complications: no    Last Vitals:  Vitals:   07/24/16 1030 07/24/16 1045  BP: (!) 144/81 137/72  Pulse: 94 (!) 105  Resp: 18 19  Temp:      Last Pain:  Vitals:   07/24/16 1045  TempSrc:   PainSc: 5                  Sekai Gitlin

## 2016-07-24 NOTE — Brief Op Note (Signed)
07/24/2016  9:57 AM  PATIENT:  Willie Howe  55 y.o. male  PRE-OPERATIVE DIAGNOSIS:  LEFT KNEE ARTHRITIS  POST-OPERATIVE DIAGNOSIS:  LEFT KNEE ARTHRITIS  PROCEDURE:  Procedure(s): TOTAL KNEE ARTHROPLASTY (Left)  SURGEON:  Surgeon(s) and Role:    * Carole Civil, MD - Primary  PHYSICIAN ASSISTANT:   ASSISTANTS: betty Caryl Pina and julie ?   ANESTHESIA:   general  EBL:  Total I/O In: 2200 [I.V.:2200] Out: 305 [Urine:300; Blood:5]  BLOOD ADMINISTERED:none  DRAINS: hemovac LOCAL MEDICATIONS USED:  MARCAINE   , Amount: 30 ml and OTHER exparel 20 /40 saline   SPECIMEN:  No Specimen  DISPOSITION OF SPECIMEN:  N/A  COUNTS:  YES  TOURNIQUET:   Total Tourniquet Time Documented: Thigh (Left) - 95 minutes Total: Thigh (Left) - 95 minutes   DICTATION: .Viviann Spare Dictation  PLAN OF CARE: Admit to inpatient   PATIENT DISPOSITION:  PACU - hemodynamically stable.   Delay start of Pharmacological VTE agent (>24hrs) due to surgical blood loss or risk of bleeding: not applicable

## 2016-07-24 NOTE — Op Note (Signed)
07/24/2016 9:59 AM Arther Abbott, MD  Surgical dictation for left total knee  Preop diagnosis osteoarthritis left knee  Postop diagnosis osteoarthritis left knee  Surgeon Dr. Aline Brochure [phone]  Assisted by betty Caryl Pina and Almyra Free  Anesthetic spinal  Implants DEPUY  SIGMA PS FB   SIZES:    F 5   T 4   P 38 x 9  Poly 10 PS  Drains: one Hemovac drain in the joint   Exparel 20MG  DILUTED W/ 40 CC SALINE   Marcaine with epinephrine    Operative findings :  Severe diffuse grade 3 cartilage wear medial femoral condyle.  Lateral and medial meniscus intact Anterior cruciate ligament and PCL intact Grade 4 cartilage wear patella Large amount of hypertrophy and synovitis diffuse Mild varus alignment Severe scarring of the patella tendon to the tibia    details of procedure:   The patient was identified in the preop holding area and the surgical site was confirmed as the left knee. Chart review and update were completed. The patient was taken to the operating room for spinal anesthesia. After successful spinal anesthesia Foley catheter was inserted. The patient was placed supine on the operating table.   the left leg was prepped with DuraPrep and draped sterilely. Timeout was completed. The limb was then exsanguinated a  6 inch Esmarch. The tourniquet was elevated to 300 mmHg.   A midline incision was made and taken down to the extensor mechanism followed by medial arthrotomy. The patella was everted. A synovectomy was performed as needed. The osteophytes were resected.  Anterior cruciate ligament and PCL and medial and lateral meniscus were resected.   a 3/8 inch drill bit was used to enter the femoral canal which was suctioned and irrigated until the fluid was clear. The distal femoral cut was set for 11 millimeter resection with a 5   Left Valgus angle. This cut was completed and checked for flatness.   the femur was then measured to a size 5.  The cutting block was placed to  match the epicondyles and the 4 distal cuts were made.   the tibia was subluxated forward and the external alignment guide was placed. We removed 4 mm of bone from the lower side. I chose the lower side in this instance because of the bulky patellar tendon and tibial tubercle and scarring on the lateral side  At this point I decided to debulk the patella by doing the patellar resection The patella measured a size 22 in thickness   We resected down to a size 14 with freehand technique after deep debulking the patellar tendon of scar tissue. We then used a 38 x 9 button and resected there is surrounding bone  We set the guide for neutral varus valgus cut related to the  Mechanical axis of the tibia and for slope matching the patient's anatomy. Rotational alignment was set using the tibial tubercle, tibial spine and second metatarsal. The cutting block was pinned and the proximal tibia was resected.    spacer blocks were placed starting with a 10 mm insert to confirm equal flexion-extension gaps. A size  10  mm insert balanced the gaps.   We placed the femoral notch cutting guide size 5  and resected the notch.   Trial implants were placed using appropriate size femur , appropriate size tibial baseplate which was measured after the proximal tibia resection. Tibial rotation was set patella tracking was normal   The tibia was then punched per manufacture technique  making sure to avoid internal rotation.     Final range of motion check was performed with the appropriate size trials as mentioned above. Satisfactory reduction and motion were obtained. Full extension was obtained and 130 of knee flexion was obtained (note preop 5 flexion contracture with 125 knee flexion)   Trial implants were removed. The bone was irrigated and dried and the cement was mixed on the back table  exparel was injected in the soft tissues and posterior capsule of the knee  These implants were then cemented in place.  Excess cement was removed. The cement was allowed to cure. Second irrigation was performed.    FInal range of motion check and stability check was completed  The wound was irrigated third time Hemovac drain was placed, extensor mechanism was closed with #1 Nurolon followed by 0 Monocryl and staples to reapproximate the skin edges and subcutaneous tissue.   Sterile dressing was applied  The patient was taken recovery in stable condition  [phone]

## 2016-07-24 NOTE — Anesthesia Procedure Notes (Signed)
Procedure Name: Intubation Date/Time: 07/24/2016 7:42 AM Performed by: Raenette Rover Pre-anesthesia Checklist: Patient identified, Emergency Drugs available, Suction available and Patient being monitored Patient Re-evaluated:Patient Re-evaluated prior to inductionOxygen Delivery Method: Circle system utilized Preoxygenation: Pre-oxygenation with 100% oxygen Intubation Type: IV induction and Cricoid Pressure applied Ventilation: Mask ventilation without difficulty and Oral airway inserted - appropriate to patient size Laryngoscope Size: Mac and 3 Grade View: Grade I Tube type: Oral Tube size: 7.0 mm Number of attempts: 1 Airway Equipment and Method: Stylet Placement Confirmation: ETT inserted through vocal cords under direct vision,  positive ETCO2,  CO2 detector and breath sounds checked- equal and bilateral Secured at: 22 cm Tube secured with: Tape Dental Injury: Teeth and Oropharynx as per pre-operative assessment

## 2016-07-25 ENCOUNTER — Encounter (HOSPITAL_COMMUNITY): Payer: Self-pay | Admitting: Orthopedic Surgery

## 2016-07-25 LAB — CBC
HEMATOCRIT: 32.8 % — AB (ref 39.0–52.0)
Hemoglobin: 10.5 g/dL — ABNORMAL LOW (ref 13.0–17.0)
MCH: 29.2 pg (ref 26.0–34.0)
MCHC: 32 g/dL (ref 30.0–36.0)
MCV: 91.4 fL (ref 78.0–100.0)
Platelets: 211 10*3/uL (ref 150–400)
RBC: 3.59 MIL/uL — ABNORMAL LOW (ref 4.22–5.81)
RDW: 14.8 % (ref 11.5–15.5)
WBC: 11.2 10*3/uL — AB (ref 4.0–10.5)

## 2016-07-25 LAB — BASIC METABOLIC PANEL
ANION GAP: 5 (ref 5–15)
BUN: 10 mg/dL (ref 6–20)
CALCIUM: 8.4 mg/dL — AB (ref 8.9–10.3)
CO2: 26 mmol/L (ref 22–32)
Chloride: 104 mmol/L (ref 101–111)
Creatinine, Ser: 0.87 mg/dL (ref 0.61–1.24)
GFR calc Af Amer: >60 mL/min (ref >60)
GFR calc non Af Amer: >60 mL/min (ref >60)
GLUCOSE: 129 mg/dL — AB (ref 65–99)
POTASSIUM: 3.8 mmol/L (ref 3.5–5.1)
Sodium: 135 mmol/L (ref 135–145)

## 2016-07-25 LAB — TYPE AND SCREEN
ABO/RH(D): A POS
Antibody Screen: NEGATIVE
DONOR AG TYPE: NEGATIVE
DONOR AG TYPE: NEGATIVE
DONOR AG TYPE: NEGATIVE
Donor AG Type: NEGATIVE
UNIT DIVISION: 0
UNIT DIVISION: 0
Unit division: 0
Unit division: 0

## 2016-07-25 LAB — PROTIME-INR
INR: 1.09
Prothrombin Time: 14.1 seconds (ref 11.4–15.2)

## 2016-07-25 LAB — GLUCOSE, CAPILLARY: GLUCOSE-CAPILLARY: 224 mg/dL — AB (ref 65–99)

## 2016-07-25 MED ORDER — OXYCODONE-ACETAMINOPHEN 5-325 MG PO TABS
1.0000 | ORAL_TABLET | ORAL | Status: DC | PRN
  Administered 2016-07-25 – 2016-07-27 (×7): 1 via ORAL
  Filled 2016-07-25 (×7): qty 1

## 2016-07-25 NOTE — Clinical Social Work Note (Signed)
CSW received consult for possible SNF. PT evaluated pt and recommendation is for outpatient PT. CSW will sign off, but can be reconsulted if needed.  Benay Pike, [place]

## 2016-07-25 NOTE — Progress Notes (Signed)
Patient ID: Willie Howe, male   DOB: 1961/05/30, 55 y.o.   MRN: [id number] BP 119/65 (BP Location: Right Arm)   Pulse 88   Temp 98 F (36.7 C) (Oral)   Resp 18   Ht 6' 2.5" (1.892 m)   Wt 276 lb (125.2 kg)   SpO2 98%   BMI 34.96 kg/m   CBC Latest Ref Rng & Units 07/25/2016 07/24/2016 07/21/2016  WBC 4.0 - 10.5 K/uL 11.2(H) 14.5(H) 7.8  Hemoglobin 13.0 - 17.0 g/dL 10.5(L) 12.4(L) 14.7  Hematocrit 39.0 - 52.0 % 32.8(L) 38.1(L) 44.2  Platelets 150 - 400 K/uL 211 210 261   BMP Latest Ref Rng & Units 07/25/2016 07/24/2016 07/21/2016  Glucose 65 - 99 mg/dL 129(H) - 120(H)  BUN 6 - 20 mg/dL 10 - 13  Creatinine 0.61 - 1.24 mg/dL 0.87 0.84 0.80  Sodium 135 - 145 mmol/L 135 - 136  Potassium 3.5 - 5.1 mmol/L 3.8 - 4.1  Chloride 101 - 111 mmol/L 104 - 105  CO2 22 - 32 mmol/L 26 - 23  Calcium 8.9 - 10.3 mg/dL 8.4(L) - 9.2   INR/Prothrombin Time 1.09

## 2016-07-25 NOTE — Anesthesia Postprocedure Evaluation (Signed)
Anesthesia Post Note  Patient: Willie Howe  Procedure(s) Performed: Procedure(s) (LRB): TOTAL KNEE ARTHROPLASTY (Left)  Patient location during evaluation: Nursing Unit Anesthesia Type: General Level of consciousness: awake and alert and oriented Pain management: pain level controlled (Receiving Dilaudid IV for pain control) Vital Signs Assessment: post-procedure vital signs reviewed and stable Respiratory status: spontaneous breathing Cardiovascular status: stable Postop Assessment: no signs of nausea or vomiting Anesthetic complications: no    Last Vitals:  Vitals:   07/24/16 2043 07/25/16 0632  BP: 111/88 119/65  Pulse: 95 88  Resp: 20 18  Temp: 36.4 C 36.7 C    Last Pain:  Vitals:   07/25/16 0639  TempSrc:   PainSc: 5                  ADAMS, AMY A

## 2016-07-25 NOTE — Care Management Note (Signed)
Case Management Note  Patient Details  Name: SAHEIM GEARY MRN: [id number] Date of Birth: 27-Sep-1960  Subjective/Objective:   LTKA. Ind with ADL's PTA. Lives with wife, has cane and walker PTA. Has PCP, transportation and insurance with prescription coverage. PT recommends OP PT per patient's request. Patient wants to go to [facility]. Called [facility], earliest appointment available, is Dec 12 @ 0900. Verified that CPM has been ordered by  [facility].             Action/Plan: Patient will DC tomorrow, Referral for PT (ambulatory referral PT) will need to be co-signed by Dr. Aline Brochure and faxed to [place] at [phone].   Expected Discharge Date:       07/25/2016          Expected Discharge Plan:  Home/Self Care (OP PT)  In-House Referral:  NA  Discharge planning Services  CM Consult  Post Acute Care Choice:  NA Choice offered to:  NA  DME Arranged:    DME Agency:     HH Arranged:    HH Agency:     Status of Service:  In process, will continue to follow  If discussed at Long Length of Stay Meetings, dates discussed:    Additional Comments:  Oryan Winterton, Chauncey Reading, RN 07/25/2016, 2:51 PM

## 2016-07-25 NOTE — Addendum Note (Signed)
Addendum  created 07/25/16 0746 by Mickel Baas, CRNA   Sign clinical note

## 2016-07-25 NOTE — Progress Notes (Signed)
Subjective: 1 Day Post-Op Procedure(s) (LRB): TOTAL KNEE ARTHROPLASTY (Left) Patient reports pain as 7 on 0-10 scale.    Objective: Vital signs in last 24 hours: Temp:  [97.5 F (36.4 C)-98.5 F (36.9 C)] 98 F (36.7 C) (12/08 [id number]) Pulse Rate:  [88-111] 88 (12/08 0632) Resp:  [15-21] 18 (12/08 [id number]) BP: (111-146)/(62-88) 119/65 (12/08 [id number]) SpO2:  [95 %-100 %] 98 % (12/08 [id number])  Intake/Output from previous day: 12/07 0701 - 12/08 0700 In: 5077 [P.O.:822; I.V.:4005; IV Piggyback:250] Out: [id number] [Urine:2550; Drains:775; Blood:5] Intake/Output this shift: No intake/output data recorded.   Recent Labs  07/24/16 1530 07/25/16 0523  HGB 12.4* 10.5*    Recent Labs  07/24/16 1530 07/25/16 0523  WBC 14.5* 11.2*  RBC 4.17* 3.59*  HCT 38.1* 32.8*  PLT 210 211    Recent Labs  07/24/16 1530 07/25/16 0523  NA  --  135  K  --  3.8  CL  --  104  CO2  --  26  BUN  --  10  CREATININE 0.84 0.87  GLUCOSE  --  129*  CALCIUM  --  8.4*    Recent Labs  07/25/16 0523  INR 1.09    Neurologically intact Neurovascular intact Sensation intact distally Intact pulses distally Dorsiflexion/Plantar flexion intact Incision: moderate drainage Compartment soft  Assessment/Plan: He continues to progress well with physical therapy. He can start oral pain medication today continue physical therapy and plan for discharge tomorrow to home with home health 1 Day Post-Op Procedure(s) (LRB): TOTAL KNEE ARTHROPLASTY (Left) Advance diet Up with therapy D/C IV fluids Plan for discharge tomorrow  Arther Abbott 07/25/2016, 8:02 AM

## 2016-07-25 NOTE — Progress Notes (Signed)
OT Cancellation Note  Patient Details Name: Willie Howe MRN: [id number] DOB: 12-May-1961   Cancelled Treatment:     Reason evaluation not completed: Chart reviewed, pt screened for OT needs. PTA independent in ADL and IADL completion, recently had TKA in early 2017 and is familiar with rehab process. Pt is currently Mod I for bed mobility, min guard for standing tasks. Pt will have assistance for ADL completion on discharge, is currently limited in LB ADL tasks due to pain. No further OT services required at this time, pt has all necessary DME.    Guadelupe Sabin, OTR/L  [phone] 07/25/2016, 8:40 AM

## 2016-07-25 NOTE — Progress Notes (Addendum)
Physical Therapy Treatment Patient Details Name: Willie Howe MRN: [id number] DOB: 1961-07-07 Today's Date: 07/25/2016    History of Present Illness 55 yo M admitted for elective L TKA.     PT Comments    Pt received in bed, and expressed increased pain in L knee this PM, but just had pain medication.  Pt was able to complete seated and standing exercises with PT today with limited reps due to increased pain.  Pt was assisted back into the bed, and into CPM.  Encouraged pt to ambulate with nursing staff this PM.  However, he has met all acute are PT goals at this time, and is cleared for d/c home with OPPT.     Follow Up Recommendations  Outpatient PT     Equipment Recommendations  None recommended by PT    Recommendations for Other Services       Precautions / Restrictions Precautions Precautions: Fall Precaution Comments: Pt reports falling out of the bed Restrictions Weight Bearing Restrictions: No    Mobility  Bed Mobility Overal bed mobility: Modified Independent                Transfers Overall transfer level: Modified independent Equipment used: Rolling walker (2 wheeled) Transfers: Sit to/from Stand Sit to Stand: Modified independent (Device/Increase time) (increased time)            Ambulation/Gait Ambulation/Gait assistance:  (Deferred this PM due to increased pain.  )               Stairs            Wheelchair Mobility    Modified Rankin (Stroke Patients Only)       Balance Overall balance assessment: Needs assistance         Standing balance support: Bilateral upper extremity supported Standing balance-Leahy Scale: Poor                      Cognition Arousal/Alertness: Awake/alert Behavior During Therapy: WFL for tasks assessed/performed Overall Cognitive Status: Within Functional Limits for tasks assessed                      Exercises General Exercises - Lower Extremity Long Arc Quad:  Strengthening;Left;5 reps;Seated Hip Flexion/Marching: Strengthening;Both;5 reps;Standing;Other (comment) (B UE support on RW) Other Exercises Other Exercises: Hamstring curl x 5 reps on the L LE in standing with B UE support on RW.     General Comments        Pertinent Vitals/Pain Pain Score: 8  Pain Location: L knee Pain Descriptors / Indicators: Aching Pain Intervention(s): Limited activity within patient's tolerance;Monitored during session;Premedicated before session;Repositioned    Home Living                      Prior Function            PT Goals (current goals can now be found in the care plan section) Acute Rehab PT Goals Patient Stated Goal: To get stronger and be more active to be able to loose weight.  PT Goal Formulation: With patient Time For Goal Achievement: 07/31/16 Potential to Achieve Goals: Good Progress towards PT goals: Progressing toward goals    Frequency    BID      PT Plan Current plan remains appropriate    Co-evaluation             End of Session Equipment Utilized During Treatment: Gait belt Activity  Tolerance: Patient tolerated treatment well Patient left: with call bell/phone within reach;with family/visitor present;in bed;in CPM     Time: 1300-1329 PT Time Calculation (min) (ACUTE ONLY): 29 min  Charges:  $Therapeutic Exercise: 8-22 mins $Therapeutic Activity: 8-22 mins                    G Codes:      Willie Howe, PT, DPT X: [phone]

## 2016-07-25 NOTE — Progress Notes (Signed)
Physical Therapy Treatment Patient Details Name: Willie Howe MRN: [id number] DOB: 1961-07-15 Today's Date: 07/25/2016    History of Present Illness 55 yo M admitted for elective L TKA.     PT Comments    Pt received in bed, and was agreeable to PT tx.  Pt demonstrated good technique with exercises, and was able to ambulate 159ft with RW and Min guard.  He also was able to negotiate 4 steps with min guard.  Will continue to progress LE strengthening during PM session today.     Follow Up Recommendations  Outpatient PT     Equipment Recommendations  None recommended by PT    Recommendations for Other Services       Precautions / Restrictions Precautions Precautions: Fall Precaution Comments: Pt reports falling out of the bed Restrictions Weight Bearing Restrictions: No    Mobility  Bed Mobility Overal bed mobility: Modified Independent                Transfers Overall transfer level: Needs assistance Equipment used: Rolling walker (2 wheeled) Transfers: Sit to/from Stand Sit to Stand: Min guard            Ambulation/Gait Ambulation/Gait assistance: Min guard Ambulation Distance (Feet): 100 Feet Assistive device: Rolling walker (2 wheeled) Gait Pattern/deviations: Step-through pattern   Gait velocity interpretation: <1.8 ft/sec, indicative of risk for recurrent falls General Gait Details: poor terminal knee extension on the left with noted heel whip and some L hip circumduction.    Stairs Stairs: Yes   Stair Management: Two rails;Step to pattern;Forwards Number of Stairs: 4    Wheelchair Mobility    Modified Rankin (Stroke Patients Only)       Balance Overall balance assessment: Needs assistance         Standing balance support: Bilateral upper extremity supported Standing balance-Leahy Scale: Poor                      Cognition Arousal/Alertness: Awake/alert Behavior During Therapy: WFL for tasks assessed/performed Overall  Cognitive Status: Within Functional Limits for tasks assessed                      Exercises Total Joint Exercises Ankle Circles/Pumps: AROM;Both;10 reps;Supine Quad Sets: Strengthening;Both;10 reps;Supine Gluteal Sets: Strengthening;Both;10 reps;Supine Heel Slides: AROM;Left;10 reps;Supine;Other (comment) (With belt to provide overpressure at max flexion range. )    General Comments        Pertinent Vitals/Pain Pain Score: 7  Pain Location: L knee Pain Descriptors / Indicators: Aching Pain Intervention(s): Limited activity within patient's tolerance;Monitored during session;Premedicated before session;Repositioned;Ice applied    Home Living                      Prior Function            PT Goals (current goals can now be found in the care plan section) Acute Rehab PT Goals Patient Stated Goal: To get stronger and be more active to be able to loose weight.  PT Goal Formulation: With patient Time For Goal Achievement: 07/31/16 Potential to Achieve Goals: Good Progress towards PT goals: Progressing toward goals    Frequency    BID      PT Plan Current plan remains appropriate    Co-evaluation             End of Session Equipment Utilized During Treatment: Gait belt Activity Tolerance: Patient tolerated treatment well Patient left: in chair;with call  bell/phone within reach;with family/visitor present     Time: [id number] PT Time Calculation (min) (ACUTE ONLY): 32 min  Charges:  $Gait Training: 8-22 mins $Therapeutic Exercise: 8-22 mins                    G Codes:      Beth Mireyah Chervenak, PT, DPT X: [phone]

## 2016-07-26 LAB — CBC
HEMATOCRIT: 32.7 % — AB (ref 39.0–52.0)
Hemoglobin: 10.8 g/dL — ABNORMAL LOW (ref 13.0–17.0)
MCH: 30.2 pg (ref 26.0–34.0)
MCHC: 33 g/dL (ref 30.0–36.0)
MCV: 91.3 fL (ref 78.0–100.0)
Platelets: 201 10*3/uL (ref 150–400)
RBC: 3.58 MIL/uL — ABNORMAL LOW (ref 4.22–5.81)
RDW: 15.2 % (ref 11.5–15.5)
WBC: 11.2 10*3/uL — ABNORMAL HIGH (ref 4.0–10.5)

## 2016-07-26 LAB — PROTIME-INR
INR: 1.13
Prothrombin Time: 14.5 seconds (ref 11.4–15.2)

## 2016-07-26 MED ORDER — OXYCODONE HCL 5 MG PO TABS
5.0000 mg | ORAL_TABLET | ORAL | Status: DC | PRN
Start: 2016-07-26 — End: 2016-07-27
  Administered 2016-07-26 (×2): 5 mg via ORAL
  Filled 2016-07-26 (×2): qty 1

## 2016-07-26 NOTE — Progress Notes (Signed)
Patient ID: Willie Howe, male   DOB: 1961-08-11, 55 y.o.   MRN: [id number] Postoperative 3 left total knee  BP (!) 142/68 (BP Location: Right Arm)   Pulse (!) 108   Temp 99.1 F (37.3 C) (Oral)   Resp 18   Ht 6' 2.5" (1.892 m)   Wt 276 lb (125.2 kg)   SpO2 97%   BMI 34.96 kg/m   Complains of increased pain today we'll delay discharge wanted to address pain control increase Percocet 10 mg a day  Drain removed

## 2016-07-27 LAB — CBC
HEMATOCRIT: 30.2 % — AB (ref 39.0–52.0)
HEMOGLOBIN: 10 g/dL — AB (ref 13.0–17.0)
MCH: 30.5 pg (ref 26.0–34.0)
MCHC: 33.1 g/dL (ref 30.0–36.0)
MCV: 92.1 fL (ref 78.0–100.0)
Platelets: 197 10*3/uL (ref 150–400)
RBC: 3.28 MIL/uL — ABNORMAL LOW (ref 4.22–5.81)
RDW: 15.1 % (ref 11.5–15.5)
WBC: 10 10*3/uL (ref 4.0–10.5)

## 2016-07-27 LAB — PROTIME-INR
INR: 1.19
PROTHROMBIN TIME: 15.1 s (ref 11.4–15.2)

## 2016-07-27 MED ORDER — OXYCODONE-ACETAMINOPHEN 10-325 MG PO TABS: 1.0000 | ORAL_TABLET | ORAL | 0 refills | Status: DC | PRN

## 2016-07-27 MED ORDER — WARFARIN SODIUM 5 MG PO TABS: 5.0000 mg | ORAL_TABLET | Freq: Every day | ORAL | Status: DC

## 2016-07-27 MED ORDER — METHOCARBAMOL 500 MG PO TABS: 500.0000 mg | ORAL_TABLET | Freq: Four times a day (QID) | ORAL | 3 refills | Status: DC | PRN

## 2016-07-27 MED ORDER — WARFARIN SODIUM 5 MG PO TABS: 5.0000 mg | ORAL_TABLET | Freq: Every day | ORAL | 0 refills | Status: DC

## 2016-07-27 MED ORDER — ENOXAPARIN SODIUM 40 MG/0.4ML ~~LOC~~ SOLN
40.0000 mg | SUBCUTANEOUS | Status: AC
  Administered 2016-07-27: 40 mg via SUBCUTANEOUS
  Filled 2016-07-27: qty 0.4

## 2016-07-27 NOTE — Discharge Summary (Signed)
Physician Discharge Summary  Patient ID: Willie Howe MRN: [id number] DOB/AGE: Nov 10, 1960 55 y.o.  Admit date: 07/24/2016 Discharge date: 07/27/2016  Admission Diagnoses: LEFT KNEE PRIMARY OSTEOARTHRITIS Discharge Diagnoses: SAME Active Problems:   Primary osteoarthritis of one knee, left   Discharged Condition: good  Hospital Course: DEC 7 HD 1 LEFT TKA GENERAL ANESTHESIA UNCOMPLICATED, OOB IN THE PM  DEC 8 HD 2 STARTED PT DID WELL  DEC 9 HD 3 INCREASED PAIN , HELD IN HOSPITAL FOR PAIN CONTROL  DEC 10 HD 4 DISCHARGED IN STABLE CONDITION   Discharge Exam: Blood pressure 120/66, pulse 91, temperature 98.9 F (37.2 C), temperature source Oral, resp. rate 18, height 6' 2.5" (1.892 m), weight 276 lb (125.2 kg), SpO2 97 %.     BMP Latest Ref Rng & Units 07/25/2016 07/24/2016 07/21/2016  Glucose 65 - 99 mg/dL 129(H) - 120(H)  BUN 6 - 20 mg/dL 10 - 13  Creatinine 0.61 - 1.24 mg/dL 0.87 0.84 0.80  Sodium 135 - 145 mmol/L 135 - 136  Potassium 3.5 - 5.1 mmol/L 3.8 - 4.1  Chloride 101 - 111 mmol/L 104 - 105  CO2 22 - 32 mmol/L 26 - 23  Calcium 8.9 - 10.3 mg/dL 8.4(L) - 9.2   CBC Latest Ref Rng & Units 07/27/2016 07/26/2016 07/25/2016  WBC 4.0 - 10.5 K/uL 10.0 11.2(H) 11.2(H)  Hemoglobin 13.0 - 17.0 g/dL 10.0(L) 10.8(L) 10.5(L)  Hematocrit 39.0 - 52.0 % 30.2(L) 32.7(L) 32.8(L)  Platelets 150 - 400 K/uL 197 201 211   INR IS 1.19  Disposition: 01-Home or Self Care  Discharge Instructions    Ambulatory referral to Physical Therapy    Complete by:  As directed    CPM    Complete by:  As directed    Continuous passive motion machine (CPM):      Use the CPM from 0 to 60 for 8 hours per day.      You may increase by 5 per day.  You may break it up into 2 or 3 sessions per day.      Use CPM for 2 weeks or until you are told to stop.   Call MD / Call 911    Complete by:  As directed    If you experience chest pain or shortness of breath, CALL 911 and be transported to the hospital  emergency room.  If you develope a fever above 101 F, pus (white drainage) or increased drainage or redness at the wound, or calf pain, call your surgeon's office.   Change dressing    Complete by:  As directed    DO NOT CHANGE DRESSING   Constipation Prevention    Complete by:  As directed    Drink plenty of fluids.  Prune juice may be helpful.  You may use a stool softener, such as Colace (over the counter) 100 mg twice a day.  Use MiraLax (over the counter) for constipation as needed.   Diet - low sodium heart healthy    Complete by:  As directed    Do not put a pillow under the knee. Place it under the heel.    Complete by:  As directed    Driving restrictions    Complete by:  As directed    No driving for 3 weeks   Increase activity slowly as tolerated    Complete by:  As directed    TED hose    Complete by:  As directed    Use stockings (  TED hose) for 2 weeks on BOTH leg(s).  You may remove them at night for sleeping.        Signed: Arther Abbott 07/27/2016, 9:34 AM

## 2016-07-27 NOTE — Progress Notes (Signed)
CM spoke with patient regarding equipment (CPM).  Per family [facility] [employer] has contacted them and will be delivering equipment.  Staff has faxed information to [place].

## 2016-07-27 NOTE — Progress Notes (Signed)
Patient discharged home, IV removed and site intact. Patient discharged home with prescriptions and personal belongings.

## 2016-07-27 NOTE — Progress Notes (Signed)
Physical Therapy Treatment Patient Details Name: HANSEN CARINO MRN: [id number] DOB: 03/30/1961 Today's Date: 07/27/2016    History of Present Illness 55 yo M admitted for elective L TKA.     PT Comments    Pt with decreased pain.  All goals are met at this time.    Follow Up Recommendations  Outpatient PT     Equipment Recommendations  None recommended by PT    Recommendations for Other Services       Precautions / Restrictions Restrictions Weight Bearing Restrictions: No    Mobility  Bed Mobility Overal bed mobility: Modified Independent                Transfers Overall transfer level: Modified independent Equipment used: Rolling walker (2 wheeled) Transfers: Sit to/from Stand Sit to Stand: Modified independent (Device/Increase time) (increased time)            Ambulation/Gait Ambulation/Gait assistance: Modified independent (Device/Increase time) (Deferred this PM due to increased pain.  ) Ambulation Distance (Feet): 200 Feet Assistive device: Rolling walker (2 wheeled)     Gait velocity interpretation: Below normal speed for age/gender     Stairs            Wheelchair Mobility    Modified Rankin (Stroke Patients Only)          Cognition Arousal/Alertness: Awake/alert Behavior During Therapy: WFL for tasks assessed/performed Overall Cognitive Status: Within Functional Limits for tasks assessed                      Exercises Total Joint Exercises Ankle Circles/Pumps: AROM;Both;10 reps;Supine Quad Sets: Strengthening;Both;10 reps;Supine Gluteal Sets: Strengthening;Both;10 reps;Supine Heel Slides: AROM;Left;10 reps;Supine;Other (comment) (With belt to provide overpressure at max flexion range. ) Straight Leg Raises: Left;5 reps Goniometric ROM: 5-94 Marching in Standing: AROM    General Comments        Pertinent Vitals/Pain      Home Living                      Prior Function   Independent         PT  Goals (current goals can now be found in the care plan section) Acute Rehab PT Goals Patient Stated Goal: To get stronger and be more active to be able to loose weight.  PT Goal Formulation: With patient Time For Goal Achievement: 07/31/16 Potential to Achieve Goals: Good    Frequency    BID      PT Plan Current plan remains appropriate       End of Session Equipment Utilized During Treatment: Gait belt Activity Tolerance: Patient tolerated treatment well Patient left: with family/visitor present;in chair;with call bell/phone within reach     Time: 0930-0954 PT Time Calculation (min) (ACUTE ONLY): 24 min  Charges:  $Gait Training: 8-22 mins $Therapeutic Exercise: 8-22 mins                    G CodesRayetta Humphrey, PT CLT [phone] 07/27/2016, 9:55 AM

## 2016-07-28 ENCOUNTER — Other Ambulatory Visit: Payer: Self-pay | Admitting: *Deleted

## 2016-07-28 ENCOUNTER — Telehealth: Payer: Self-pay | Admitting: Orthopedic Surgery

## 2016-07-28 DIAGNOSIS — Z96652 Presence of left artificial knee joint: Secondary | ICD-10-CM

## 2016-07-28 NOTE — Telephone Encounter (Signed)
DONE

## 2016-07-28 NOTE — Telephone Encounter (Signed)
We received a call from [facility] in [place] stating that they received an order for home PT.  [name] had a left total knee on 07-24-16.  She states the order needs to read outpatient PT and not home PT.    He has an appointment with them on Tuesday at 9:00.  She wants [place] to fax this order to [phone]. If any questions, please call [phone]  Thanks York Cerise

## 2016-07-30 ENCOUNTER — Telehealth: Payer: Self-pay | Admitting: Orthopedic Surgery

## 2016-07-30 NOTE — Telephone Encounter (Signed)
SPOKE WITH PATIENT, ADVISED TO LEAVE BANDAGE IN PLACE UNLESS IT STARTS TO LEAK

## 2016-07-30 NOTE — Telephone Encounter (Signed)
Patient's wife called asking to speak with you, she is concerned about the patient's bandage on his left knee.  Myra  [phone]    Please call and advise

## 2016-08-05 ENCOUNTER — Ambulatory Visit (INDEPENDENT_AMBULATORY_CARE_PROVIDER_SITE_OTHER): Payer: BLUE CROSS/BLUE SHIELD | Admitting: Orthopedic Surgery

## 2016-08-05 ENCOUNTER — Encounter: Payer: Self-pay | Admitting: Orthopedic Surgery

## 2016-08-05 DIAGNOSIS — Z96652 Presence of left artificial knee joint: Secondary | ICD-10-CM

## 2016-08-05 DIAGNOSIS — Z4889 Encounter for other specified surgical aftercare: Secondary | ICD-10-CM

## 2016-08-05 MED ORDER — OXYCODONE-ACETAMINOPHEN 10-325 MG PO TABS: 1.0000 | ORAL_TABLET | ORAL | 0 refills | Status: DC | PRN

## 2016-08-05 NOTE — Progress Notes (Signed)
Patient ID: Willie Howe, male   DOB: November 14, 1960, 55 y.o.   MRN: [id number]  Post op visit   Chief Complaint  Patient presents with  . Follow-up    POST OP 1, LEFT TKA, DOS 07/24/16    He s doing very well  He is using a cane and he has 0-90 AROM  Ankle no swelling and there are no signs of DVT   Warfarin 5 mg per day INR pending   Fu in 4 weeks  Meds ordered this encounter  Medications  . oxyCODONE-acetaminophen (PERCOCET) 10-325 MG tablet    Sig: Take 1 tablet by mouth every 4 (four) hours as needed for pain.    Dispense:  84 tablet    Refill:  0

## 2016-08-20 ENCOUNTER — Other Ambulatory Visit (HOSPITAL_COMMUNITY): Payer: Self-pay | Admitting: Orthopedic Surgery

## 2016-08-20 ENCOUNTER — Other Ambulatory Visit: Payer: Self-pay | Admitting: Orthopedic Surgery

## 2016-08-20 DIAGNOSIS — Z4889 Encounter for other specified surgical aftercare: Secondary | ICD-10-CM

## 2016-08-21 ENCOUNTER — Other Ambulatory Visit (HOSPITAL_COMMUNITY): Payer: Self-pay | Admitting: Orthopedic Surgery

## 2016-08-21 ENCOUNTER — Other Ambulatory Visit: Payer: Self-pay | Admitting: Orthopedic Surgery

## 2016-08-21 DIAGNOSIS — Z4889 Encounter for other specified surgical aftercare: Secondary | ICD-10-CM

## 2016-08-22 MED ORDER — OXYCODONE-ACETAMINOPHEN 7.5-325 MG PO TABS: 1.0000 | ORAL_TABLET | ORAL | 0 refills | Status: DC | PRN

## 2016-08-22 MED ORDER — WARFARIN SODIUM 5 MG PO TABS: 5.0000 mg | ORAL_TABLET | Freq: Every day | ORAL | 0 refills | Status: DC

## 2016-09-02 ENCOUNTER — Ambulatory Visit (INDEPENDENT_AMBULATORY_CARE_PROVIDER_SITE_OTHER): Payer: BLUE CROSS/BLUE SHIELD | Admitting: Orthopedic Surgery

## 2016-09-02 ENCOUNTER — Other Ambulatory Visit (HOSPITAL_COMMUNITY): Payer: Self-pay | Admitting: Orthopedic Surgery

## 2016-09-02 DIAGNOSIS — Z4889 Encounter for other specified surgical aftercare: Secondary | ICD-10-CM

## 2016-09-02 DIAGNOSIS — Z96652 Presence of left artificial knee joint: Secondary | ICD-10-CM

## 2016-09-02 MED ORDER — OXYCODONE-ACETAMINOPHEN 7.5-325 MG PO TABS: 1.0000 | ORAL_TABLET | ORAL | 0 refills | Status: DC | PRN

## 2016-09-02 NOTE — Progress Notes (Signed)
Patient ID: Willie Howe, male   DOB: 11/04/1960, 56 y.o.   MRN: [id number]  Follow up visit/  POST OP   Chief Complaint  Patient presents with  . Follow-up    POST OP LEFT TKA, DOS 07/24/16   Postop day #40  Knee range of motion 3-105  No peripheral edema swelling or calf tenderness  The patient ambulatory with a cane  His pain is improving we can reduce his oxycodone down to 5 mg NEXT TIME   Return 6 weeks continue therapy  STOP WARFARIN THURS (POD42)  [facility] controlled substance reporting system reviewed  2:00 PM Arther Abbott, MD 09/02/2016

## 2016-09-02 NOTE — Patient Instructions (Signed)
STOP WARFARIN THURS

## 2016-09-12 ENCOUNTER — Other Ambulatory Visit: Payer: Self-pay | Admitting: Orthopedic Surgery

## 2016-09-15 ENCOUNTER — Other Ambulatory Visit: Payer: Self-pay | Admitting: Orthopedic Surgery

## 2016-09-15 MED ORDER — OXYCODONE-ACETAMINOPHEN 7.5-325 MG PO TABS: 1.0000 | ORAL_TABLET | ORAL | 0 refills | Status: DC | PRN

## 2016-09-15 NOTE — Progress Notes (Signed)
[place] controlled substance reporting system reviewed  

## 2016-09-29 ENCOUNTER — Other Ambulatory Visit: Payer: Self-pay | Admitting: Orthopedic Surgery

## 2016-09-29 ENCOUNTER — Telehealth: Payer: Self-pay | Admitting: Orthopedic Surgery

## 2016-09-30 MED ORDER — OXYCODONE-ACETAMINOPHEN 7.5-325 MG PO TABS: 1.0000 | ORAL_TABLET | ORAL | 0 refills | Status: DC | PRN

## 2016-10-14 ENCOUNTER — Encounter: Payer: Self-pay | Admitting: Orthopedic Surgery

## 2016-10-14 ENCOUNTER — Ambulatory Visit (INDEPENDENT_AMBULATORY_CARE_PROVIDER_SITE_OTHER): Payer: BLUE CROSS/BLUE SHIELD

## 2016-10-14 ENCOUNTER — Ambulatory Visit (INDEPENDENT_AMBULATORY_CARE_PROVIDER_SITE_OTHER): Payer: BLUE CROSS/BLUE SHIELD | Admitting: Orthopedic Surgery

## 2016-10-14 DIAGNOSIS — M25562 Pain in left knee: Secondary | ICD-10-CM

## 2016-10-14 DIAGNOSIS — Z96652 Presence of left artificial knee joint: Secondary | ICD-10-CM

## 2016-10-14 MED ORDER — OXYCODONE-ACETAMINOPHEN 5-325 MG PO TABS: 1.0000 | ORAL_TABLET | ORAL | 0 refills | Status: DC | PRN

## 2016-10-14 MED ORDER — FUROSEMIDE 20 MG PO TABS: 20.0000 mg | ORAL_TABLET | Freq: Every day | ORAL | 0 refills | Status: DC

## 2016-10-14 NOTE — Progress Notes (Signed)
Chief Complaint  Patient presents with  . Follow-up    LEFT TKA, DOS 07/24/16   Shams comes in for her some postop visit status post left total knees coming up on 12 weeks post surgery. She complains of pain in his left tibia radiating down to his left knee which is episodic appears to be more prominent after he's been sitting and gets up and stands on the knee.  He notices more swelling. He says he can bend his knee very well he's happy with that but he is concerned about the recent pain that is felt radiating down to his toes  Examination reveals no joint effusion but has bilateral pitting edema with tenderness in the left shin starting at the end of the incision down to the foot  The x-ray of the knee compared to the postop film shows no evidence of loosening or infection  I placed him on Lasix  I refilled his Percocet and decreased the dose to 5 mg I will see him in a month.  It appears to me that he has peripheral edema with tenderness.

## 2016-10-20 ENCOUNTER — Ambulatory Visit: Payer: Self-pay | Admitting: Orthopedic Surgery

## 2016-10-27 ENCOUNTER — Ambulatory Visit: Payer: Self-pay | Admitting: Orthopedic Surgery

## 2016-11-03 ENCOUNTER — Other Ambulatory Visit: Payer: Self-pay | Admitting: Orthopedic Surgery

## 2016-11-03 DIAGNOSIS — Z96652 Presence of left artificial knee joint: Secondary | ICD-10-CM

## 2016-11-03 MED ORDER — OXYCODONE-ACETAMINOPHEN 5-325 MG PO TABS
1.0000 | ORAL_TABLET | ORAL | 0 refills | Status: DC | PRN
Start: 2016-11-03 — End: 2016-11-20

## 2016-11-08 ENCOUNTER — Other Ambulatory Visit: Payer: Self-pay | Admitting: Orthopedic Surgery

## 2016-11-08 DIAGNOSIS — Z96652 Presence of left artificial knee joint: Secondary | ICD-10-CM

## 2016-11-11 ENCOUNTER — Ambulatory Visit (INDEPENDENT_AMBULATORY_CARE_PROVIDER_SITE_OTHER): Payer: BLUE CROSS/BLUE SHIELD | Admitting: Orthopedic Surgery

## 2016-11-11 ENCOUNTER — Encounter: Payer: Self-pay | Admitting: Orthopedic Surgery

## 2016-11-11 ENCOUNTER — Ambulatory Visit (INDEPENDENT_AMBULATORY_CARE_PROVIDER_SITE_OTHER): Payer: BLUE CROSS/BLUE SHIELD

## 2016-11-11 DIAGNOSIS — M25562 Pain in left knee: Secondary | ICD-10-CM

## 2016-11-11 DIAGNOSIS — M1710 Unilateral primary osteoarthritis, unspecified knee: Secondary | ICD-10-CM

## 2016-11-11 DIAGNOSIS — Z96653 Presence of artificial knee joint, bilateral: Secondary | ICD-10-CM

## 2016-11-11 NOTE — Progress Notes (Signed)
Annual follow-up visit status post right total knee  Chief Complaint  Patient presents with  . Follow-up    annual xray RT TKA, 02/02/15  . Knee Pain    LT knee pain s/p fall; left total knee 07/24/2016     [name] fell down his stairs onto a flexed left knee: Immediate pain and swelling had trouble weightbearing for a few days. He put ice on it rested and now he comes in with a slight limp and medial knee pain. He denies any instability  As far as his right knee goes is not having any pain  On last visit he was having some pretibial pain and we put him on Lasix for swelling  His pretibial pain is 75% better  Past Medical History:  Diagnosis Date  . Anginal pain ([place])   . Arthritis   . BPPV (benign paroxysmal positional vertigo)   . DVT (deep venous thrombosis) ([place])   . Dysrhythmia   . Graves disease 1996  . History of kidney stones   . History of pulmonary embolism   . Obesity   . Previous back surgery    x 3  . Rheumatoid arthritis ([place])   . Sleep apnea   . Thyroid disease    Graves Disease     ROS Skin remains normal with no rash or erythema  He has no chest pain or shortness of breath  Physical Exam The patient is in good spirits today's mood and affect are normal is oriented 3 his appearance is normal his gait and station show slight limp favoring the left leg   Right knee range of motion 0-1 05 Stability normal, skin intact no tenderness, good quadriceps strength is exhibited. No swelling is noted in the joint.  Left knee range of motion 0-1 20  He is tender over the medial side of the left knee there is no instability in extension or flexion there is no tenderness in the pretibial area  His peripheral edema today is under control with no pitting edema in either leg  Sensation remains normal in both lower extremities  We took x-rays 3 views of both knees  Both knees show stable in pants with no acute fracture dislocation or loosening  Encounter  Diagnoses  Name Primary?  . History of bilateral knee replacement Yes  . Acute pain of left knee     Recommend three-month follow-up

## 2016-11-20 ENCOUNTER — Other Ambulatory Visit: Payer: Self-pay | Admitting: Orthopedic Surgery

## 2016-11-20 DIAGNOSIS — Z96652 Presence of left artificial knee joint: Secondary | ICD-10-CM

## 2016-11-21 MED ORDER — OXYCODONE-ACETAMINOPHEN 5-325 MG PO TABS: 1.0000 | ORAL_TABLET | ORAL | 0 refills | Status: DC | PRN

## 2016-12-08 ENCOUNTER — Encounter: Payer: Self-pay | Admitting: Orthopedic Surgery

## 2016-12-10 ENCOUNTER — Other Ambulatory Visit: Payer: Self-pay | Admitting: Orthopedic Surgery

## 2016-12-10 DIAGNOSIS — Z96652 Presence of left artificial knee joint: Secondary | ICD-10-CM

## 2016-12-11 ENCOUNTER — Other Ambulatory Visit: Payer: Self-pay | Admitting: *Deleted

## 2016-12-11 ENCOUNTER — Encounter: Payer: Self-pay | Admitting: Neurology

## 2016-12-11 ENCOUNTER — Ambulatory Visit (INDEPENDENT_AMBULATORY_CARE_PROVIDER_SITE_OTHER): Payer: BLUE CROSS/BLUE SHIELD | Admitting: Neurology

## 2016-12-11 VITALS — BP 122/74 | HR 85 | Ht 75.0 in | Wt 283.0 lb

## 2016-12-11 DIAGNOSIS — G4733 Obstructive sleep apnea (adult) (pediatric): Secondary | ICD-10-CM

## 2016-12-11 DIAGNOSIS — Z9989 Dependence on other enabling machines and devices: Secondary | ICD-10-CM

## 2016-12-11 DIAGNOSIS — G4752 REM sleep behavior disorder: Secondary | ICD-10-CM | POA: Diagnosis not present

## 2016-12-11 DIAGNOSIS — Z96652 Presence of left artificial knee joint: Secondary | ICD-10-CM

## 2016-12-11 MED ORDER — OXYCODONE-ACETAMINOPHEN 5-325 MG PO TABS: 1.0000 | ORAL_TABLET | ORAL | 0 refills | Status: DC | PRN
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
## 2016-12-11 NOTE — Patient Instructions (Addendum)
Please continue using your CPAP regularly. While your insurance requires that you use CPAP at least 4 hours each night on 70% of the nights, I recommend, that you not skip any nights and use it throughout the night if you can. Getting used to CPAP and staying with the treatment long term does take time and patience and discipline. Untreated obstructive sleep apnea when it is moderate to severe can have an adverse impact on cardiovascular health and raise her risk for heart disease, arrhythmias, hypertension, congestive heart failure, stroke and diabetes. Untreated obstructive sleep apnea causes sleep disruption, nonrestorative sleep, and sleep deprivation. This can have an impact on your day to day functioning and cause daytime sleepiness and impairment of cognitive function, memory loss, mood disturbance, and problems focussing. Using CPAP regularly can improve these symptoms.  We can see you in 1 year, you can see one of our nurse practitioners as you are doing better. I will see you after that.   You can try some Melatonin at night for sleep and for your dream enactments: take 1 mg to 3 mg, one to 2 hours before your bedtime. It is over the counter and comes in pill form, chewable form.

## 2016-12-15 ENCOUNTER — Other Ambulatory Visit: Payer: Self-pay | Admitting: Orthopedic Surgery

## 2016-12-15 DIAGNOSIS — Z96652 Presence of left artificial knee joint: Secondary | ICD-10-CM

## 2016-12-22 ENCOUNTER — Encounter: Payer: Self-pay | Admitting: Orthopedic Surgery

## 2017-01-01 IMAGING — CR DG KNEE 1-2V PORT*R*
1 series · 2 of 2 positions shown · non-contrast
Comparison: 06/29/2014

CLINICAL DATA: Status post right knee replacement

EXAM:
PORTABLE RIGHT KNEE - 1-2 VIEW

[Series 2: ap · 0.17mm/px · 2 of 2 slices shown]
[im 1/2]
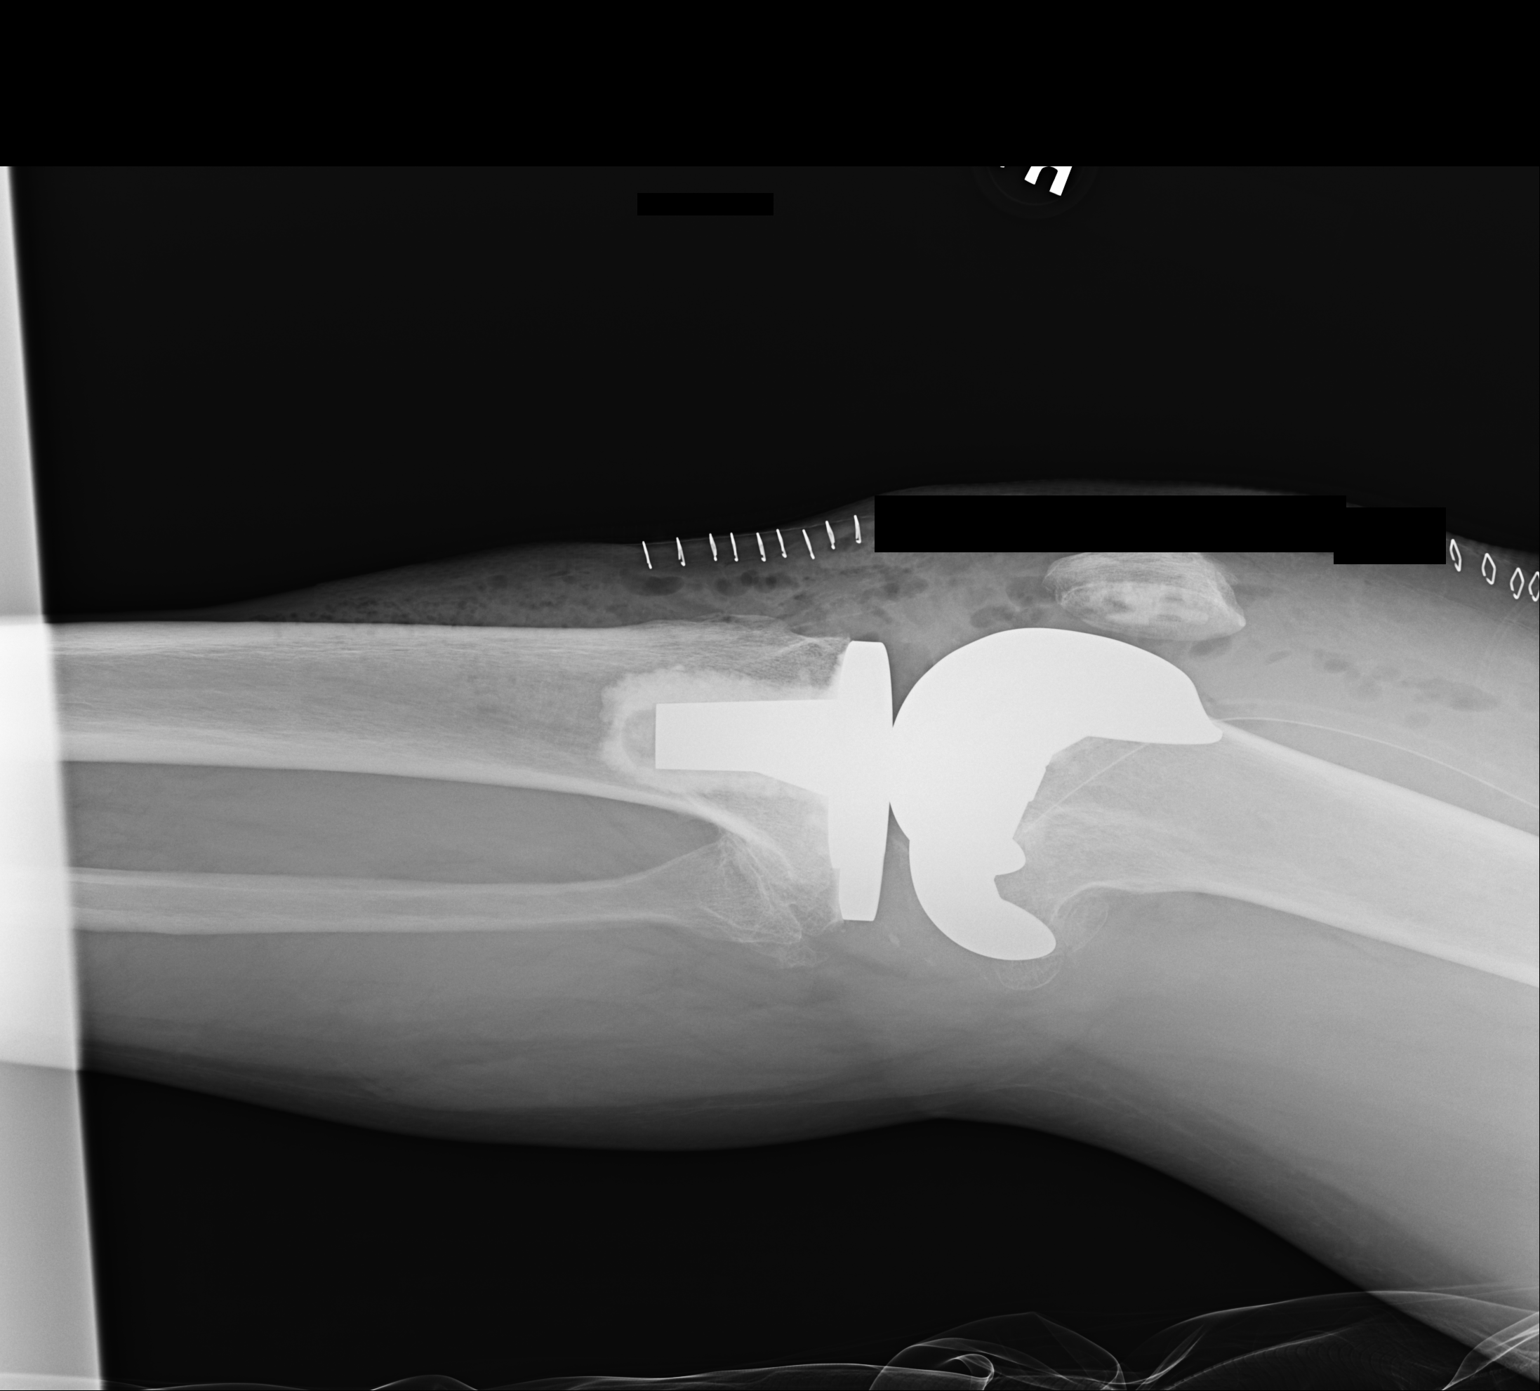
[im 2/2]
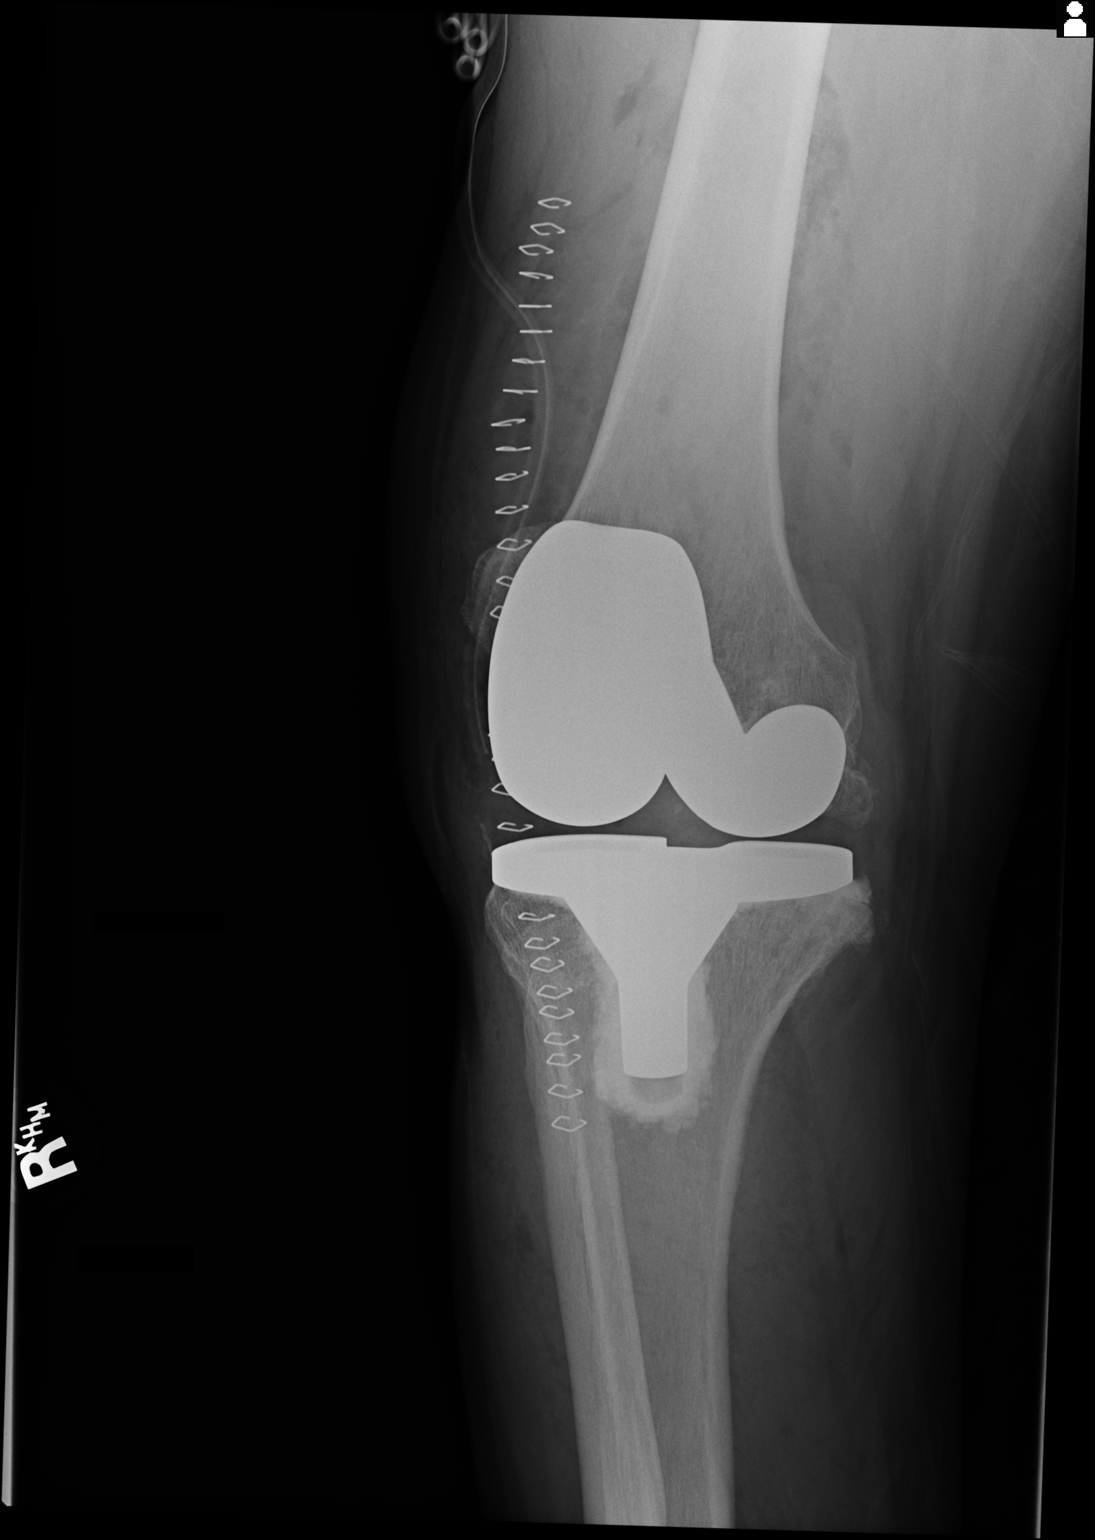

[2 of 2 positions shown; findings below may reference images not displayed]

FINDINGS: A right knee replacement is now noted. No acute bony abnormality is
seen. A surgical drain is noted in place.
IMPRESSION: Status post knee replacement without acute abnormality.

## 2017-01-04 ENCOUNTER — Other Ambulatory Visit: Payer: Self-pay | Admitting: Orthopedic Surgery

## 2017-01-04 DIAGNOSIS — Z96652 Presence of left artificial knee joint: Secondary | ICD-10-CM

## 2017-01-05 ENCOUNTER — Other Ambulatory Visit: Payer: Self-pay | Admitting: Orthopedic Surgery

## 2017-01-05 DIAGNOSIS — Z96652 Presence of left artificial knee joint: Secondary | ICD-10-CM

## 2017-01-05 MED ORDER — OXYCODONE-ACETAMINOPHEN 5-325 MG PO TABS: 1.0000 | ORAL_TABLET | Freq: Four times a day (QID) | ORAL | 0 refills | Status: DC | PRN

## 2017-01-16 ENCOUNTER — Other Ambulatory Visit: Payer: Self-pay | Admitting: Orthopedic Surgery

## 2017-01-16 DIAGNOSIS — Z96652 Presence of left artificial knee joint: Secondary | ICD-10-CM

## 2017-01-20 ENCOUNTER — Telehealth: Payer: Self-pay | Admitting: Orthopedic Surgery

## 2017-01-20 NOTE — Telephone Encounter (Signed)
Patient called and also had come by and spoke with front office staff. States today upon my returning call is that "all he needs is a note from Dr Aline Brochure stating that he had surgery".  His total left knee replacement surgery was 07/24/16.  Patient now relays that he is in process of appealing a denial by [employer] for his CPM machine.  Please advise if patient's note needs to include any other specific information.

## 2017-01-20 NOTE — Telephone Encounter (Signed)
A LETTER IS IN IN HIS CHART

## 2017-01-21 NOTE — Telephone Encounter (Signed)
Per call back from [facility] representative, Avis Rodgers-we have signed authorization on file by patient; the note has already been issued by Dr Aline Brochure, on 12/23/16.  Copy printed, along with requested note.  Faxed to: Fax# [phone], Reference [phone] [employer]). Patient aware.

## 2017-02-10 ENCOUNTER — Ambulatory Visit (INDEPENDENT_AMBULATORY_CARE_PROVIDER_SITE_OTHER): Payer: BLUE CROSS/BLUE SHIELD | Admitting: Orthopedic Surgery

## 2017-02-10 ENCOUNTER — Encounter: Payer: Self-pay | Admitting: Orthopedic Surgery

## 2017-02-10 DIAGNOSIS — Z96653 Presence of artificial knee joint, bilateral: Secondary | ICD-10-CM | POA: Diagnosis not present

## 2017-02-10 NOTE — Progress Notes (Signed)
Chief Complaint  Patient presents with  . Follow-up    LEFT TKA, DOS 07/24/16    01/2015 RT TKA  C/O Of pain along the iliotibial band when he is lying down or if he makes a sudden move this is in the left knee  He weaned himself off of his pain medicine which he really took more for his back and his knee  Review of Systems  Musculoskeletal: Positive for back pain.   Left Knee Exam  Swelling: None Effusion: No  Tenderness  The patient is experiencing tenderness in the itb.  Range of Motion  Extension: 0 Flexion:     110  Tests  Drawer:       Anterior - Negative     Posterior - Negative  Right Knee Exam   Tenderness  None  Range of Motion  Extension: Flexion:    90  Muscle Strength  Normal right knee strength  Tests  Drawer:       Posterior - Negative     Encounter Diagnosis  Name Primary?  . History of bilateral knee replacement Yes    FU DEC XRAYS BOTH KNEES

## 2017-03-11 ENCOUNTER — Other Ambulatory Visit: Payer: Self-pay | Admitting: Orthopedic Surgery

## 2017-03-11 DIAGNOSIS — Z96652 Presence of left artificial knee joint: Secondary | ICD-10-CM

## 2017-05-10 ENCOUNTER — Other Ambulatory Visit: Payer: Self-pay | Admitting: Orthopedic Surgery

## 2017-05-10 DIAGNOSIS — Z96652 Presence of left artificial knee joint: Secondary | ICD-10-CM

## 2017-06-09 ENCOUNTER — Other Ambulatory Visit: Payer: Self-pay | Admitting: Orthopedic Surgery

## 2017-06-09 DIAGNOSIS — Z96652 Presence of left artificial knee joint: Secondary | ICD-10-CM

## 2017-07-01 DIAGNOSIS — M2550 Pain in unspecified joint: Secondary | ICD-10-CM | POA: Diagnosis not present

## 2017-07-01 DIAGNOSIS — M0589 Other rheumatoid arthritis with rheumatoid factor of multiple sites: Secondary | ICD-10-CM | POA: Diagnosis not present

## 2017-07-01 DIAGNOSIS — Z79899 Other long term (current) drug therapy: Secondary | ICD-10-CM | POA: Diagnosis not present

## 2017-07-01 DIAGNOSIS — Z6836 Body mass index (BMI) 36.0-36.9, adult: Secondary | ICD-10-CM | POA: Diagnosis not present

## 2017-07-01 DIAGNOSIS — E669 Obesity, unspecified: Secondary | ICD-10-CM | POA: Diagnosis not present

## 2017-07-01 DIAGNOSIS — Z719 Counseling, unspecified: Secondary | ICD-10-CM | POA: Diagnosis not present

## 2017-07-01 DIAGNOSIS — M150 Primary generalized (osteo)arthritis: Secondary | ICD-10-CM | POA: Diagnosis not present

## 2017-07-17 DIAGNOSIS — K635 Polyp of colon: Secondary | ICD-10-CM | POA: Diagnosis not present

## 2017-07-17 DIAGNOSIS — Z1211 Encounter for screening for malignant neoplasm of colon: Secondary | ICD-10-CM | POA: Diagnosis not present

## 2017-07-17 DIAGNOSIS — D122 Benign neoplasm of ascending colon: Secondary | ICD-10-CM | POA: Diagnosis not present

## 2017-08-12 DIAGNOSIS — Z96651 Presence of right artificial knee joint: Secondary | ICD-10-CM | POA: Insufficient documentation

## 2017-08-12 DIAGNOSIS — Z96652 Presence of left artificial knee joint: Secondary | ICD-10-CM | POA: Insufficient documentation

## 2017-08-14 ENCOUNTER — Ambulatory Visit: Payer: BLUE CROSS/BLUE SHIELD | Admitting: Orthopedic Surgery

## 2017-08-19 ENCOUNTER — Ambulatory Visit: Payer: BLUE CROSS/BLUE SHIELD | Admitting: Orthopedic Surgery

## 2017-09-02 ENCOUNTER — Ambulatory Visit (INDEPENDENT_AMBULATORY_CARE_PROVIDER_SITE_OTHER): Payer: Medicare HMO

## 2017-09-02 ENCOUNTER — Encounter: Payer: Self-pay | Admitting: Orthopedic Surgery

## 2017-09-02 ENCOUNTER — Ambulatory Visit: Payer: Medicare HMO | Admitting: Orthopedic Surgery

## 2017-09-02 VITALS — BP 124/76 | HR 89 | Ht 75.0 in | Wt 279.0 lb

## 2017-09-02 DIAGNOSIS — Z96652 Presence of left artificial knee joint: Secondary | ICD-10-CM | POA: Diagnosis not present

## 2017-09-02 DIAGNOSIS — T8482XD Fibrosis due to internal orthopedic prosthetic devices, implants and grafts, subsequent encounter: Secondary | ICD-10-CM | POA: Diagnosis not present

## 2017-09-02 DIAGNOSIS — Z96651 Presence of right artificial knee joint: Secondary | ICD-10-CM

## 2017-09-02 NOTE — Progress Notes (Signed)
Progress Note   Patient ID: Willie Howe, male   DOB: 03-15-1961, 57 y.o.   MRN: [id number]  Chief Complaint  Patient presents with  . Follow-up    Annual Xrays bilateral knee replacements LEFT -2017, RIGHT - 1262    57 year old male status post bilateral total knees the first 1 was done in 2016 the second 1 in 2017  He complains of a popping sensation early in the morning on the right and soreness on the left.  He walks 2-1/2 miles per day 4 days/week with no complaints     Review of Systems  Constitutional: Negative for chills and fever.  Skin: Negative.   Neurological: Negative for sensory change.   Current Meds  Medication Sig  . acetaminophen (TYLENOL) 325 MG tablet Take 650 mg by mouth every 6 (six) hours as needed.  Mariane Baumgarten Calcium (STOOL SOFTENER PO) Take 1 capsule by mouth daily.   . folic acid (FOLVITE) 1 MG tablet Take 1 mg by mouth daily.  Marland Kitchen loratadine (CLARITIN) 10 MG tablet Take 10 mg by mouth daily as needed for allergies.   . metFORMIN (GLUCOPHAGE) 500 MG tablet Take 500 mg by mouth daily with breakfast.   . methotrexate (RHEUMATREX) 2.5 MG tablet Take 20 mg by mouth once a week. 8 tablets on Friday. Caution:Chemotherapy. Protect from light.  . tamsulosin (FLOMAX) 0.4 MG CAPS capsule Take 0.4 mg by mouth daily.    No Known Allergies   BP 124/76   Pulse 89   Ht 6\' 3"  (1.905 m)   Wt 279 lb (126.6 kg)   BMI 34.87 kg/m   Physical Exam  Constitutional: He is oriented to person, place, and time. He appears well-developed and well-nourished.  Vital signs have been reviewed and are stable. Gen. appearance the patient is well-developed and well-nourished with normal grooming and hygiene.   Musculoskeletal:       Legs: GAIT IS no assistive devices needed no limping is noted on walking  Neurological: He is alert and oriented to person, place, and time.  Skin: Skin is warm and dry. No erythema.  Psychiatric: He has a normal mood and affect.  Vitals  reviewed.    Medical decision-making Encounter Diagnoses  Name Primary?  . S/P total knee replacement, right 02/02/15 Yes  . S/P total knee replacement, left 07/24/16   . Arthrofibrosis of total knee replacement, subsequent encounter     AP and lateral of both knees today show no loosening normal alignment please see dictated report  Follow-up in a year x-ray both knees return sooner if there are any problems     Arther Abbott, MD 09/02/2017 10:02 AM

## 2017-09-21 DIAGNOSIS — R69 Illness, unspecified: Secondary | ICD-10-CM | POA: Diagnosis not present

## 2017-10-01 DIAGNOSIS — M150 Primary generalized (osteo)arthritis: Secondary | ICD-10-CM | POA: Diagnosis not present

## 2017-10-01 DIAGNOSIS — M0589 Other rheumatoid arthritis with rheumatoid factor of multiple sites: Secondary | ICD-10-CM | POA: Diagnosis not present

## 2017-10-01 DIAGNOSIS — Z719 Counseling, unspecified: Secondary | ICD-10-CM | POA: Diagnosis not present

## 2017-10-01 DIAGNOSIS — M2550 Pain in unspecified joint: Secondary | ICD-10-CM | POA: Diagnosis not present

## 2017-10-01 DIAGNOSIS — Z79899 Other long term (current) drug therapy: Secondary | ICD-10-CM | POA: Diagnosis not present

## 2017-10-01 DIAGNOSIS — Z6837 Body mass index (BMI) 37.0-37.9, adult: Secondary | ICD-10-CM | POA: Diagnosis not present

## 2017-10-01 DIAGNOSIS — E669 Obesity, unspecified: Secondary | ICD-10-CM | POA: Diagnosis not present

## 2017-10-16 DIAGNOSIS — Z0100 Encounter for examination of eyes and vision without abnormal findings: Secondary | ICD-10-CM | POA: Diagnosis not present

## 2017-10-16 DIAGNOSIS — Z0101 Encounter for examination of eyes and vision with abnormal findings: Secondary | ICD-10-CM | POA: Diagnosis not present

## 2017-11-10 DIAGNOSIS — E119 Type 2 diabetes mellitus without complications: Secondary | ICD-10-CM | POA: Diagnosis not present

## 2017-11-10 DIAGNOSIS — E7800 Pure hypercholesterolemia, unspecified: Secondary | ICD-10-CM | POA: Diagnosis not present

## 2017-11-11 DIAGNOSIS — Z8639 Personal history of other endocrine, nutritional and metabolic disease: Secondary | ICD-10-CM | POA: Insufficient documentation

## 2017-11-11 DIAGNOSIS — Z125 Encounter for screening for malignant neoplasm of prostate: Secondary | ICD-10-CM | POA: Diagnosis not present

## 2017-11-11 DIAGNOSIS — E7800 Pure hypercholesterolemia, unspecified: Secondary | ICD-10-CM | POA: Diagnosis not present

## 2017-11-11 DIAGNOSIS — Z1159 Encounter for screening for other viral diseases: Secondary | ICD-10-CM | POA: Diagnosis not present

## 2017-11-11 DIAGNOSIS — Z23 Encounter for immunization: Secondary | ICD-10-CM | POA: Diagnosis not present

## 2017-11-11 DIAGNOSIS — Z6834 Body mass index (BMI) 34.0-34.9, adult: Secondary | ICD-10-CM | POA: Diagnosis not present

## 2017-11-11 DIAGNOSIS — E119 Type 2 diabetes mellitus without complications: Secondary | ICD-10-CM | POA: Diagnosis not present

## 2017-11-11 DIAGNOSIS — E6609 Other obesity due to excess calories: Secondary | ICD-10-CM | POA: Diagnosis not present

## 2017-12-04 DIAGNOSIS — H524 Presbyopia: Secondary | ICD-10-CM | POA: Diagnosis not present

## 2017-12-04 DIAGNOSIS — H52203 Unspecified astigmatism, bilateral: Secondary | ICD-10-CM | POA: Diagnosis not present

## 2017-12-04 DIAGNOSIS — H2513 Age-related nuclear cataract, bilateral: Secondary | ICD-10-CM | POA: Diagnosis not present

## 2017-12-04 DIAGNOSIS — E119 Type 2 diabetes mellitus without complications: Secondary | ICD-10-CM | POA: Diagnosis not present

## 2017-12-04 DIAGNOSIS — H5213 Myopia, bilateral: Secondary | ICD-10-CM | POA: Diagnosis not present

## 2017-12-15 ENCOUNTER — Ambulatory Visit: Payer: BLUE CROSS/BLUE SHIELD | Admitting: Neurology

## 2017-12-16 DIAGNOSIS — R69 Illness, unspecified: Secondary | ICD-10-CM | POA: Diagnosis not present

## 2017-12-30 DIAGNOSIS — E669 Obesity, unspecified: Secondary | ICD-10-CM | POA: Diagnosis not present

## 2017-12-30 DIAGNOSIS — Z79899 Other long term (current) drug therapy: Secondary | ICD-10-CM | POA: Diagnosis not present

## 2017-12-30 DIAGNOSIS — M150 Primary generalized (osteo)arthritis: Secondary | ICD-10-CM | POA: Diagnosis not present

## 2017-12-30 DIAGNOSIS — Z6835 Body mass index (BMI) 35.0-35.9, adult: Secondary | ICD-10-CM | POA: Diagnosis not present

## 2017-12-30 DIAGNOSIS — M2550 Pain in unspecified joint: Secondary | ICD-10-CM | POA: Diagnosis not present

## 2017-12-30 DIAGNOSIS — M0589 Other rheumatoid arthritis with rheumatoid factor of multiple sites: Secondary | ICD-10-CM | POA: Diagnosis not present

## 2018-01-20 DIAGNOSIS — M0589 Other rheumatoid arthritis with rheumatoid factor of multiple sites: Secondary | ICD-10-CM | POA: Diagnosis not present

## 2018-01-22 DIAGNOSIS — R972 Elevated prostate specific antigen [PSA]: Secondary | ICD-10-CM | POA: Diagnosis not present

## 2018-01-22 DIAGNOSIS — B9620 Unspecified Escherichia coli [E. coli] as the cause of diseases classified elsewhere: Secondary | ICD-10-CM | POA: Diagnosis not present

## 2018-01-22 DIAGNOSIS — N390 Urinary tract infection, site not specified: Secondary | ICD-10-CM | POA: Diagnosis not present

## 2018-01-22 DIAGNOSIS — Z8042 Family history of malignant neoplasm of prostate: Secondary | ICD-10-CM | POA: Diagnosis not present

## 2018-02-08 DIAGNOSIS — M0589 Other rheumatoid arthritis with rheumatoid factor of multiple sites: Secondary | ICD-10-CM | POA: Diagnosis not present

## 2018-03-01 ENCOUNTER — Other Ambulatory Visit: Payer: Self-pay | Admitting: Family Medicine

## 2018-03-01 ENCOUNTER — Ambulatory Visit
Admission: RE | Admit: 2018-03-01 | Discharge: 2018-03-01 | Disposition: A | Discharge status: Home / Self Care | Payer: Medicare HMO | Source: Ambulatory Visit | Attending: Family Medicine | Admitting: Family Medicine

## 2018-03-01 DIAGNOSIS — M48062 Spinal stenosis, lumbar region with neurogenic claudication: Secondary | ICD-10-CM

## 2018-03-01 DIAGNOSIS — M4326 Fusion of spine, lumbar region: Secondary | ICD-10-CM | POA: Diagnosis not present

## 2018-03-01 DIAGNOSIS — M48061 Spinal stenosis, lumbar region without neurogenic claudication: Secondary | ICD-10-CM | POA: Diagnosis not present

## 2018-03-01 DIAGNOSIS — M5416 Radiculopathy, lumbar region: Secondary | ICD-10-CM | POA: Diagnosis not present

## 2018-03-01 DIAGNOSIS — M069 Rheumatoid arthritis, unspecified: Secondary | ICD-10-CM | POA: Diagnosis not present

## 2018-03-09 DIAGNOSIS — Z79899 Other long term (current) drug therapy: Secondary | ICD-10-CM | POA: Diagnosis not present

## 2018-03-09 DIAGNOSIS — M0589 Other rheumatoid arthritis with rheumatoid factor of multiple sites: Secondary | ICD-10-CM | POA: Diagnosis not present

## 2018-03-23 DIAGNOSIS — M533 Sacrococcygeal disorders, not elsewhere classified: Secondary | ICD-10-CM | POA: Diagnosis not present

## 2018-03-23 DIAGNOSIS — R030 Elevated blood-pressure reading, without diagnosis of hypertension: Secondary | ICD-10-CM | POA: Diagnosis not present

## 2018-03-23 DIAGNOSIS — Z6834 Body mass index (BMI) 34.0-34.9, adult: Secondary | ICD-10-CM | POA: Diagnosis not present

## 2018-03-31 ENCOUNTER — Other Ambulatory Visit: Payer: Self-pay | Admitting: Neurological Surgery

## 2018-03-31 DIAGNOSIS — M533 Sacrococcygeal disorders, not elsewhere classified: Secondary | ICD-10-CM

## 2018-04-01 DIAGNOSIS — M150 Primary generalized (osteo)arthritis: Secondary | ICD-10-CM | POA: Diagnosis not present

## 2018-04-01 DIAGNOSIS — M2550 Pain in unspecified joint: Secondary | ICD-10-CM | POA: Diagnosis not present

## 2018-04-01 DIAGNOSIS — E669 Obesity, unspecified: Secondary | ICD-10-CM | POA: Diagnosis not present

## 2018-04-01 DIAGNOSIS — M0589 Other rheumatoid arthritis with rheumatoid factor of multiple sites: Secondary | ICD-10-CM | POA: Diagnosis not present

## 2018-04-01 DIAGNOSIS — Z79899 Other long term (current) drug therapy: Secondary | ICD-10-CM | POA: Diagnosis not present

## 2018-04-01 DIAGNOSIS — Z6836 Body mass index (BMI) 36.0-36.9, adult: Secondary | ICD-10-CM | POA: Diagnosis not present

## 2018-04-05 DIAGNOSIS — R69 Illness, unspecified: Secondary | ICD-10-CM | POA: Diagnosis not present

## 2018-04-09 ENCOUNTER — Ambulatory Visit
Admission: RE | Admit: 2018-04-09 | Discharge: 2018-04-09 | Disposition: A | Discharge status: Home / Self Care | Payer: Medicare HMO | Source: Ambulatory Visit | Attending: Neurological Surgery | Admitting: Neurological Surgery

## 2018-04-09 DIAGNOSIS — M533 Sacrococcygeal disorders, not elsewhere classified: Secondary | ICD-10-CM

## 2018-04-09 MED ORDER — METHYLPREDNISOLONE ACETATE 40 MG/ML INJ SUSP (RADIOLOG
120.0000 mg | Freq: Once | INTRAMUSCULAR | Status: AC
  Administered 2018-04-09: 120 mg via INTRA_ARTICULAR

## 2018-04-09 MED ORDER — IOPAMIDOL (ISOVUE-M 200) INJECTION 41%
1.0000 mL | Freq: Once | INTRAMUSCULAR | Status: AC
  Administered 2018-04-09: 1 mL via INTRA_ARTICULAR

## 2018-04-09 NOTE — Discharge Instructions (Signed)

## 2018-04-12 DIAGNOSIS — R69 Illness, unspecified: Secondary | ICD-10-CM | POA: Diagnosis not present

## 2018-05-04 DIAGNOSIS — Z79899 Other long term (current) drug therapy: Secondary | ICD-10-CM | POA: Diagnosis not present

## 2018-05-04 DIAGNOSIS — M0589 Other rheumatoid arthritis with rheumatoid factor of multiple sites: Secondary | ICD-10-CM | POA: Diagnosis not present

## 2018-05-06 DIAGNOSIS — E039 Hypothyroidism, unspecified: Secondary | ICD-10-CM | POA: Diagnosis not present

## 2018-05-06 DIAGNOSIS — I10 Essential (primary) hypertension: Secondary | ICD-10-CM | POA: Diagnosis not present

## 2018-05-06 DIAGNOSIS — E669 Obesity, unspecified: Secondary | ICD-10-CM | POA: Diagnosis not present

## 2018-05-06 DIAGNOSIS — E119 Type 2 diabetes mellitus without complications: Secondary | ICD-10-CM | POA: Diagnosis not present

## 2018-05-06 DIAGNOSIS — Z809 Family history of malignant neoplasm, unspecified: Secondary | ICD-10-CM | POA: Diagnosis not present

## 2018-05-06 DIAGNOSIS — E785 Hyperlipidemia, unspecified: Secondary | ICD-10-CM | POA: Diagnosis not present

## 2018-05-06 DIAGNOSIS — R32 Unspecified urinary incontinence: Secondary | ICD-10-CM | POA: Diagnosis not present

## 2018-05-06 DIAGNOSIS — K219 Gastro-esophageal reflux disease without esophagitis: Secondary | ICD-10-CM | POA: Diagnosis not present

## 2018-05-06 DIAGNOSIS — Z803 Family history of malignant neoplasm of breast: Secondary | ICD-10-CM | POA: Diagnosis not present

## 2018-05-06 DIAGNOSIS — Z6834 Body mass index (BMI) 34.0-34.9, adult: Secondary | ICD-10-CM | POA: Diagnosis not present

## 2018-05-13 DIAGNOSIS — Z1159 Encounter for screening for other viral diseases: Secondary | ICD-10-CM | POA: Diagnosis not present

## 2018-05-13 DIAGNOSIS — Z125 Encounter for screening for malignant neoplasm of prostate: Secondary | ICD-10-CM | POA: Diagnosis not present

## 2018-05-13 DIAGNOSIS — E6609 Other obesity due to excess calories: Secondary | ICD-10-CM | POA: Diagnosis not present

## 2018-05-13 DIAGNOSIS — Z6834 Body mass index (BMI) 34.0-34.9, adult: Secondary | ICD-10-CM | POA: Diagnosis not present

## 2018-05-13 DIAGNOSIS — E7800 Pure hypercholesterolemia, unspecified: Secondary | ICD-10-CM | POA: Diagnosis not present

## 2018-05-13 DIAGNOSIS — E119 Type 2 diabetes mellitus without complications: Secondary | ICD-10-CM | POA: Diagnosis not present

## 2018-05-13 DIAGNOSIS — Z8639 Personal history of other endocrine, nutritional and metabolic disease: Secondary | ICD-10-CM | POA: Diagnosis not present

## 2018-05-18 DIAGNOSIS — R972 Elevated prostate specific antigen [PSA]: Secondary | ICD-10-CM | POA: Diagnosis not present

## 2018-05-18 DIAGNOSIS — D72825 Bandemia: Secondary | ICD-10-CM | POA: Diagnosis not present

## 2018-05-18 DIAGNOSIS — E7800 Pure hypercholesterolemia, unspecified: Secondary | ICD-10-CM | POA: Diagnosis not present

## 2018-05-18 DIAGNOSIS — Z23 Encounter for immunization: Secondary | ICD-10-CM | POA: Diagnosis not present

## 2018-05-18 DIAGNOSIS — Z6833 Body mass index (BMI) 33.0-33.9, adult: Secondary | ICD-10-CM | POA: Diagnosis not present

## 2018-05-18 DIAGNOSIS — Z0000 Encounter for general adult medical examination without abnormal findings: Secondary | ICD-10-CM | POA: Diagnosis not present

## 2018-05-18 DIAGNOSIS — E6609 Other obesity due to excess calories: Secondary | ICD-10-CM | POA: Diagnosis not present

## 2018-05-18 DIAGNOSIS — E119 Type 2 diabetes mellitus without complications: Secondary | ICD-10-CM | POA: Diagnosis not present

## 2018-07-01 DIAGNOSIS — E669 Obesity, unspecified: Secondary | ICD-10-CM | POA: Diagnosis not present

## 2018-07-01 DIAGNOSIS — Z79899 Other long term (current) drug therapy: Secondary | ICD-10-CM | POA: Diagnosis not present

## 2018-07-01 DIAGNOSIS — M150 Primary generalized (osteo)arthritis: Secondary | ICD-10-CM | POA: Diagnosis not present

## 2018-07-01 DIAGNOSIS — M0589 Other rheumatoid arthritis with rheumatoid factor of multiple sites: Secondary | ICD-10-CM | POA: Diagnosis not present

## 2018-07-01 DIAGNOSIS — Z6836 Body mass index (BMI) 36.0-36.9, adult: Secondary | ICD-10-CM | POA: Diagnosis not present

## 2018-07-01 DIAGNOSIS — M2550 Pain in unspecified joint: Secondary | ICD-10-CM | POA: Diagnosis not present

## 2018-07-06 DIAGNOSIS — M0589 Other rheumatoid arthritis with rheumatoid factor of multiple sites: Secondary | ICD-10-CM | POA: Diagnosis not present

## 2018-07-07 DIAGNOSIS — R972 Elevated prostate specific antigen [PSA]: Secondary | ICD-10-CM | POA: Diagnosis not present

## 2018-07-12 DIAGNOSIS — R69 Illness, unspecified: Secondary | ICD-10-CM | POA: Diagnosis not present

## 2018-07-27 DIAGNOSIS — Z86010 Personal history of colonic polyps: Secondary | ICD-10-CM | POA: Diagnosis not present

## 2018-07-27 DIAGNOSIS — K5904 Chronic idiopathic constipation: Secondary | ICD-10-CM | POA: Diagnosis not present

## 2018-07-27 DIAGNOSIS — Z1211 Encounter for screening for malignant neoplasm of colon: Secondary | ICD-10-CM | POA: Diagnosis not present

## 2018-07-27 DIAGNOSIS — Z800 Family history of malignant neoplasm of digestive organs: Secondary | ICD-10-CM | POA: Diagnosis not present

## 2018-07-27 DIAGNOSIS — E669 Obesity, unspecified: Secondary | ICD-10-CM | POA: Diagnosis not present

## 2018-07-28 DIAGNOSIS — Z8042 Family history of malignant neoplasm of prostate: Secondary | ICD-10-CM | POA: Insufficient documentation

## 2018-07-30 DIAGNOSIS — Z8042 Family history of malignant neoplasm of prostate: Secondary | ICD-10-CM | POA: Diagnosis not present

## 2018-07-30 DIAGNOSIS — R972 Elevated prostate specific antigen [PSA]: Secondary | ICD-10-CM | POA: Diagnosis not present

## 2018-08-20 DIAGNOSIS — M0589 Other rheumatoid arthritis with rheumatoid factor of multiple sites: Secondary | ICD-10-CM | POA: Diagnosis not present

## 2018-09-01 ENCOUNTER — Ambulatory Visit: Payer: Self-pay | Admitting: Orthopedic Surgery

## 2018-09-01 DIAGNOSIS — K635 Polyp of colon: Secondary | ICD-10-CM | POA: Diagnosis not present

## 2018-09-01 DIAGNOSIS — Z1211 Encounter for screening for malignant neoplasm of colon: Secondary | ICD-10-CM | POA: Diagnosis not present

## 2018-09-01 DIAGNOSIS — Z800 Family history of malignant neoplasm of digestive organs: Secondary | ICD-10-CM | POA: Diagnosis not present

## 2018-09-01 DIAGNOSIS — D125 Benign neoplasm of sigmoid colon: Secondary | ICD-10-CM | POA: Diagnosis not present

## 2018-09-01 DIAGNOSIS — Z86010 Personal history of colonic polyps: Secondary | ICD-10-CM | POA: Diagnosis not present

## 2018-09-15 ENCOUNTER — Ambulatory Visit (INDEPENDENT_AMBULATORY_CARE_PROVIDER_SITE_OTHER): Payer: Medicare HMO

## 2018-09-15 ENCOUNTER — Ambulatory Visit: Payer: Medicare HMO | Admitting: Orthopedic Surgery

## 2018-09-15 ENCOUNTER — Encounter: Payer: Self-pay | Admitting: Orthopedic Surgery

## 2018-09-15 VITALS — BP 144/84 | HR 80 | Ht 75.0 in | Wt 282.0 lb

## 2018-09-15 DIAGNOSIS — Z96652 Presence of left artificial knee joint: Secondary | ICD-10-CM | POA: Diagnosis not present

## 2018-09-15 DIAGNOSIS — Z96651 Presence of right artificial knee joint: Secondary | ICD-10-CM

## 2018-09-15 NOTE — Progress Notes (Signed)
ANNUAL FOLLOW UP FOR  Bilateral  TKA   Chief Complaint  Patient presents with  . Post-op Follow-up    left 07/24/16 right 02/02/15/ knee replacements      HPI: The patient is here for the annual  follow-up x-ray for knee replacement. The patient is c/o pain in both knees   Review of Systems  Constitutional: Negative for fever and malaise/fatigue.    Past Medical History:  Diagnosis Date  . Anginal pain ([place])   . Arthritis   . BPPV (benign paroxysmal positional vertigo)   . DVT (deep venous thrombosis) ([place])   . Dysrhythmia   . Graves disease 1996  . History of kidney stones   . History of pulmonary embolism   . Obesity   . Previous back surgery    x 3  . Rheumatoid arthritis ([place])   . Sleep apnea   . Thyroid disease    Graves Disease     Examination of the right  KNEE  BP (!) 144/84   Pulse 80   Ht 6\' 3"  (1.905 m)   Wt 282 lb (127.9 kg)   BMI 35.25 kg/m   General the patient is normally groomed in no distress  Mood normal Affect pleasant   The patient is Awake and alert ; oriented normal   Inspection shows : incision healed nicely without erythema, no tenderness no swelling, warmth  Range of motion total range of motion is 95  Stability the knee is stable anterior to posterior as well as medial to lateral  Strength quadriceps strength is normal  Skin no erythema around the skin incision  Cardiovascular NO EDEMA   Neuro: normal sensation in the operative leg   Gait: normal expected gait without cane   Left knee  Normal incision no warmth no swelling in the joint Flexion arc is 115 degrees Stress test confirm stability in anterior and posterior plane as well as coronal coronal plane No erythema quadricep strength is normal   Medical decision-making section  X-rays ordered with the following personal interpretation  Normal alignment without loosening   Diagnosis  Encounter Diagnoses  Name Primary?  . S/P total knee replacement, right  02/02/15 Yes  . S/P total knee replacement, left 07/24/16    I dont see anything unusual  If he has fever erythema or change in the joint he can see [place] sooner  Topicals are ok   Plan follow-up 1 year repeat x-rays

## 2018-09-15 NOTE — Patient Instructions (Signed)
These are the muscle and arthrits creams I recommend:  PLEASE READ THE PACKAGE INSTRUCTIONS BEFORE USING   Ben Gay arthritis cream  Icy hot vanishing gel  Aspercreme odor free  Myoflex Oderless pain reliever  Capzasin  Sportscreme  Max freeze  

## 2018-10-01 DIAGNOSIS — M0589 Other rheumatoid arthritis with rheumatoid factor of multiple sites: Secondary | ICD-10-CM | POA: Diagnosis not present

## 2018-10-01 DIAGNOSIS — Z79899 Other long term (current) drug therapy: Secondary | ICD-10-CM | POA: Diagnosis not present

## 2018-10-06 DIAGNOSIS — R69 Illness, unspecified: Secondary | ICD-10-CM | POA: Diagnosis not present

## 2018-10-25 DIAGNOSIS — R300 Dysuria: Secondary | ICD-10-CM | POA: Diagnosis not present

## 2018-10-25 DIAGNOSIS — R972 Elevated prostate specific antigen [PSA]: Secondary | ICD-10-CM | POA: Diagnosis not present

## 2018-10-25 DIAGNOSIS — S3992XA Unspecified injury of lower back, initial encounter: Secondary | ICD-10-CM | POA: Diagnosis not present

## 2018-10-26 ENCOUNTER — Ambulatory Visit
Admission: RE | Admit: 2018-10-26 | Discharge: 2018-10-26 | Disposition: A | Discharge status: Home / Self Care | Payer: Medicare HMO | Source: Ambulatory Visit | Attending: Family Medicine | Admitting: Family Medicine

## 2018-10-26 ENCOUNTER — Other Ambulatory Visit: Payer: Self-pay | Admitting: Family Medicine

## 2018-10-26 DIAGNOSIS — T1490XA Injury, unspecified, initial encounter: Secondary | ICD-10-CM

## 2018-10-26 DIAGNOSIS — M545 Low back pain: Secondary | ICD-10-CM | POA: Diagnosis not present

## 2018-10-26 DIAGNOSIS — S3992XA Unspecified injury of lower back, initial encounter: Secondary | ICD-10-CM | POA: Diagnosis not present

## 2018-12-02 DIAGNOSIS — M0589 Other rheumatoid arthritis with rheumatoid factor of multiple sites: Secondary | ICD-10-CM | POA: Diagnosis not present

## 2018-12-24 DIAGNOSIS — N400 Enlarged prostate without lower urinary tract symptoms: Secondary | ICD-10-CM | POA: Diagnosis not present

## 2018-12-24 DIAGNOSIS — E669 Obesity, unspecified: Secondary | ICD-10-CM | POA: Diagnosis not present

## 2018-12-24 DIAGNOSIS — K219 Gastro-esophageal reflux disease without esophagitis: Secondary | ICD-10-CM | POA: Diagnosis not present

## 2018-12-24 DIAGNOSIS — I499 Cardiac arrhythmia, unspecified: Secondary | ICD-10-CM | POA: Diagnosis not present

## 2018-12-24 DIAGNOSIS — D649 Anemia, unspecified: Secondary | ICD-10-CM | POA: Diagnosis not present

## 2018-12-24 DIAGNOSIS — G4700 Insomnia, unspecified: Secondary | ICD-10-CM | POA: Diagnosis not present

## 2018-12-24 DIAGNOSIS — M069 Rheumatoid arthritis, unspecified: Secondary | ICD-10-CM | POA: Diagnosis not present

## 2018-12-24 DIAGNOSIS — K5900 Constipation, unspecified: Secondary | ICD-10-CM | POA: Diagnosis not present

## 2018-12-24 DIAGNOSIS — G8929 Other chronic pain: Secondary | ICD-10-CM | POA: Diagnosis not present

## 2018-12-24 DIAGNOSIS — E119 Type 2 diabetes mellitus without complications: Secondary | ICD-10-CM | POA: Diagnosis not present

## 2018-12-28 ENCOUNTER — Other Ambulatory Visit: Payer: Self-pay

## 2018-12-28 ENCOUNTER — Other Ambulatory Visit: Payer: Self-pay | Admitting: Physician Assistant

## 2018-12-28 ENCOUNTER — Ambulatory Visit
Admission: RE | Admit: 2018-12-28 | Discharge: 2018-12-28 | Disposition: A | Discharge status: Home / Self Care | Payer: Medicare HMO | Source: Ambulatory Visit | Attending: Physician Assistant | Admitting: Physician Assistant

## 2018-12-28 DIAGNOSIS — M25512 Pain in left shoulder: Secondary | ICD-10-CM | POA: Diagnosis not present

## 2018-12-28 DIAGNOSIS — R52 Pain, unspecified: Secondary | ICD-10-CM

## 2019-01-06 DIAGNOSIS — Z79899 Other long term (current) drug therapy: Secondary | ICD-10-CM | POA: Diagnosis not present

## 2019-01-06 DIAGNOSIS — M0589 Other rheumatoid arthritis with rheumatoid factor of multiple sites: Secondary | ICD-10-CM | POA: Diagnosis not present

## 2019-01-06 DIAGNOSIS — M2550 Pain in unspecified joint: Secondary | ICD-10-CM | POA: Diagnosis not present

## 2019-01-06 DIAGNOSIS — R69 Illness, unspecified: Secondary | ICD-10-CM | POA: Diagnosis not present

## 2019-01-06 DIAGNOSIS — M150 Primary generalized (osteo)arthritis: Secondary | ICD-10-CM | POA: Diagnosis not present

## 2019-01-07 ENCOUNTER — Ambulatory Visit: Payer: Medicare HMO | Admitting: Orthopedic Surgery

## 2019-01-07 ENCOUNTER — Other Ambulatory Visit: Payer: Self-pay

## 2019-01-07 ENCOUNTER — Encounter: Payer: Self-pay | Admitting: Orthopedic Surgery

## 2019-01-07 VITALS — BP 151/91 | HR 83 | Temp 97.0°F | Ht 75.0 in | Wt 283.0 lb

## 2019-01-07 DIAGNOSIS — S46012A Strain of muscle(s) and tendon(s) of the rotator cuff of left shoulder, initial encounter: Secondary | ICD-10-CM

## 2019-01-07 DIAGNOSIS — M25512 Pain in left shoulder: Secondary | ICD-10-CM | POA: Diagnosis not present

## 2019-01-07 MED ORDER — IBUPROFEN 800 MG PO TABS: 800.0000 mg | ORAL_TABLET | Freq: Three times a day (TID) | ORAL | 0 refills | Status: DC | PRN

## 2019-01-07 MED ORDER — HYDROCODONE-ACETAMINOPHEN 5-325 MG PO TABS: 1.0000 | ORAL_TABLET | Freq: Four times a day (QID) | ORAL | 0 refills | Status: DC | PRN

## 2019-01-07 NOTE — Patient Instructions (Addendum)
Take norco 5 mg for 5 days and ibuprofen every day   Rotator Cuff Tear  A rotator cuff tear is a partial or complete tear of the cord-like bands (tendons) that connect muscle to bone in the rotator cuff. The rotator cuff is a group of muscles and tendons that surround the shoulder joint and keep the upper arm bone (humerus) in the shoulder socket. The tear can occur suddenly (acute tear) or can develop over a long period of time (chronic tear). What are the causes? Acute tears may be caused by:  A fall, especially on an outstretched arm.  Lifting very heavy objects with a jerking motion. Chronic tears may be caused by overuse of the muscles. This may happen in sports, physical work, or activities in which your arm repeatedly moves over your head. What increases the risk? This condition is more likely to occur in:  Athletes and workers who frequently use their shoulder or reach over their heads. This may include activities such as: ? Tennis. ? Baseball and softball. ? Swimming and rowing. ? Weightlifting. ? Architect work. ? Painting.  People who smoke.  Older people who have arthritis or poor blood supply. These can make the muscles and tendons weaker. What are the signs or symptoms? Symptoms of this condition depend on the type and severity of the injury:  An acute tear may include a sudden tearing feeling, followed by severe pain that goes from your upper shoulder, down your arm, and toward your elbow.  A chronic tear includes a gradual weakness and decreased shoulder motion as the pain gets worse. The pain is usually worse at night. Both types may have symptoms such as:  Pain that spreads (radiates) from the shoulder to the upper arm.  Swelling and tenderness in front of the shoulder.  Decreased range of motion.  Pain when: ? Reaching, pulling, or lifting the arm above the head. ? Lowering the arm from above the head.  Not being able to raise your arm out to the  side.  Difficulty placing the arm behind your back. How is this diagnosed? This condition is diagnosed with a medical history and physical exam. Imaging tests may also be done, including:  X-rays.  MRI.  Ultrasound.  CT or MR arthrogram. During this test, a contrast material is injected into your shoulder and then images are taken. How is this treated? Treatment for this condition depends on the type and severity of the condition. In less severe cases, treatment may include:  Rest. This may be done with a sling that holds the shoulder still (immobilization). Your health care provider may also recommend avoiding activities that involve lifting your arm over your head.  Icing the shoulder.  Anti-inflammatory medicines, such as aspirin or ibuprofen.  Strengthening and stretching exercises. Your health care provider may recommend specific exercises to improve your range of motion and strengthen your shoulder. In more severe cases, treatment may include:  Physical therapy.  Steroid injections.  Surgery. Follow these instructions at home: Managing pain, stiffness, and swelling  If directed, put ice on the injured area. ? If you have a removable sling, remove it as told by your health care provider. ? Put ice in a plastic bag. ? Place a towel between your skin and the bag. ? Leave the ice on for 20 minutes, 2-3 times a day.  Raise (elevate) the injured area above the level of your heart while you are lying down.  Find a comfortable sleeping position or sleep  on a recliner, if available.  Move your fingers often to avoid stiffness and to lessen swelling.  Once the swelling has gone down, your health care provider may direct you to apply heat to relax the muscles. Use the heat source that your health care provider recommends, such as a moist heat pack or a heating pad. ? Place a towel between your skin and the heat source. ? Leave the heat on for 20-30 minutes. ? Remove the  heat if your skin turns bright red. This is especially important if you are unable to feel pain, heat, or cold. You may have a greater risk of getting burned. If you have a sling:  Wear the sling as told by your health care provider. Remove it only as told by your health care provider.  Loosen the sling if your fingers tingle, become numb, or turn cold and blue.  Keep the sling clean.  If the sling is not waterproof: ? Do not let it get wet. ? Cover it with a watertight covering when you take a bath or a shower. Driving  Do not drive or use heavy machinery while taking prescription pain medicine.  Ask your health care provider when it is safe to drive if you have a sling on your arm. Activity  Rest your shoulder as told by your health care provider.  Return to your normal activities as told by your health care provider. Ask your health care provider what activities are safe for you.  Do any exercises or stretches as told by your health care provider. General instructions  Do not use any products that contain nicotine or tobacco, such as cigarettes and e-cigarettes. If you need help quitting, ask your health care provider.  Take over-the-counter and prescription medicines only as told by your health care provider.  Keep all follow-up visits as told by your health care provider. This is important. Contact a health care provider if:  Your pain gets worse.  You have new pain in your arm, hands, or fingers.  Medicine does not help your pain. Get help right away if:  Your arm, hand, or fingers are numb or tingling.  Your arm, hand, or fingers are swollen or painful or they turn white or blue.  Your hand or fingers on your injured arm are colder than your other hand. Summary  A rotator cuff tear is a partial or complete tear of the cord-like bands (tendons) that connect muscle to bone in the rotator cuff.  The tear can occur suddenly (acute tear) or can develop over a long  period of time (chronic tear).  Treatment generally includes rest, anti-inflammatory medicines, and icing. In some cases, physical therapy and steroid injections may be needed. In severe cases, surgery may be needed. This information is not intended to replace advice given to you by your health care provider. Make sure you discuss any questions you have with your health care provider. Document Released: 08/01/2000 Document Revised: 10/20/2016 Document Reviewed: 10/20/2016 Elsevier Interactive Patient Education  2019 [employer].

## 2019-01-07 NOTE — Progress Notes (Signed)
NEW PROBLEM OFFICE VISIT  Chief Complaint  Patient presents with  . Shoulder Pain    left shoulder pain s/p fall 12/24/18     58 yo male traumatic injury left shoulder 2 weeks ago (5/8)   Since that time cannot raise left arm   Pain left shoulder 8/10 Sharp Loss of motion      Review of Systems  Musculoskeletal: Positive for joint pain.  Endo/Heme/Allergies: Negative for environmental allergies.  All other systems reviewed and are negative.    Past Medical History:  Diagnosis Date  . Anginal pain ([place])   . Arthritis   . BPPV (benign paroxysmal positional vertigo)   . DVT (deep venous thrombosis) ([place])   . Dysrhythmia   . Graves disease 1996  . History of kidney stones   . History of pulmonary embolism   . Obesity   . Previous back surgery    x 3  . Rheumatoid arthritis ([place])   . Sleep apnea   . Thyroid disease    Graves Disease    Past Surgical History:  Procedure Laterality Date  . BACK SURGERY    . COLONOSCOPY    . EXAM UNDER ANESTHESIA WITH MANIPULATION OF KNEE Right 04/18/2015   Procedure: MANIPULATION OF RIGHT KNEE UNDER ANESTHESIA;  Surgeon: Carole Civil, MD;  Location: [facility];  Service: Orthopedics;  Laterality: Right;  . IVC Filter     removed December 2016  . [place]  2010  . KNEE ARTHROSCOPY Right 2006   meniscus   . KNEE ARTHROSCOPY WITH MEDIAL MENISECTOMY Left 04/24/2016   Procedure: LEFT KNEE ARTHROSCOPY WITH PARTIAL MEDIAL MENISECTOMY;  Surgeon: Carole Civil, MD;  Location: [facility];  Service: Orthopedics;  Laterality: Left;  . KNEE SURGERY     x 2  . KNEE SURGERY Left 1976   ?ligament repair   . LUMBAR WOUND DEBRIDEMENT N/A 02/23/2014   Procedure: LUMBAR WOUND DEBRIDEMENT;  Surgeon: Faythe Ghee, MD;  Location: [facility];  Service: Neurosurgery;  Laterality: N/A;  exploration lumbar wound. repair of dural defect.  Marland Kitchen MENISECTOMY Right    open medial   . NEPHROLITHOTOMY    . PERIPHERAL VASCULAR CATHETERIZATION N/A  01/30/2015   Procedure: IVC Filter Insertion;  Surgeon: Serafina Mitchell, MD;  Location: [facility];  Service: Cardiovascular;  Laterality: N/A;  . PERIPHERAL VASCULAR CATHETERIZATION N/A 04/24/2015   Procedure: IVC Filter Removal;  Surgeon: Serafina Mitchell, MD;  Location: [facility];  Service: Cardiovascular;  Laterality: N/A;  . PERIPHERAL VASCULAR CATHETERIZATION N/A 08/14/2015   Procedure: IVC Filter Removal;  Surgeon: Serafina Mitchell, MD;  Location: [facility];  Service: Cardiovascular;  Laterality: N/A;  . SPINE SURGERY  2015   Dr Hal Neer Fusion   . SPINE SURGERY  1999   discectomy  . TOTAL KNEE ARTHROPLASTY Right 02/02/2015   Procedure:  RIGHT TOTAL KNEE ARTHROPLASTY;  Surgeon: Carole Civil, MD;  Location: [facility];  Service: Orthopedics;  Laterality: Right;  LM with pt's daughter of new arrival time (10:45)    . TOTAL KNEE ARTHROPLASTY Left 07/24/2016   Procedure: TOTAL KNEE ARTHROPLASTY;  Surgeon: Carole Civil, MD;  Location: [facility];  Service: Orthopedics;  Laterality: Left;    Family History  Problem Relation Age of Onset  . Deep vein thrombosis Mother   . Hypertension Mother   . Breast cancer Mother   . Hypertension Father   . Prostate cancer Father   .  CAD Sister    Social History   Tobacco Use  . Smoking status: Former Smoker    Packs/day: 1.50    Years: 20.00    Pack years: 30.00    Types: Cigarettes    Last attempt to quit: 02/03/1995    Years since quitting: 23.9  . Smokeless tobacco: Never Used  Substance Use Topics  . Alcohol use: No    Alcohol/week: 0.0 standard drinks  . Drug use: No    Allergies  Allergen Reactions  . Lisinopril Cough    Current Meds  Medication Sig  . acetaminophen (TYLENOL) 325 MG tablet Take 650 mg by mouth every 6 (six) hours as needed.  Mariane Baumgarten Calcium (STOOL SOFTENER PO) Take 1 capsule by mouth daily.   . folic acid (FOLVITE) 1 MG tablet Take 1 mg by mouth daily.  . furosemide (LASIX) 20 MG  tablet TAKE 1 TABLET (20 MG TOTAL) BY MOUTH DAILY.  . inFLIXimab (REMICADE) 100 MG injection Inject 100 mg into the vein. Every 6 to 8 weeks  . loratadine (CLARITIN) 10 MG tablet Take 10 mg by mouth daily as needed for allergies.   . metFORMIN (GLUCOPHAGE) 500 MG tablet Take 500 mg by mouth daily with breakfast.   . methotrexate (RHEUMATREX) 2.5 MG tablet Take 20 mg by mouth once a week. 8 tablets on Friday. Caution:Chemotherapy. Protect from light.  . tamsulosin (FLOMAX) 0.4 MG CAPS capsule Take 0.4 mg by mouth daily.    BP (!) 151/91   Pulse 83   Temp (!) 97 F (36.1 C)   Ht 6\' 3"  (1.905 m)   Wt 283 lb (128.4 kg)   BMI 35.37 kg/m   Physical Exam Vitals signs and nursing note reviewed.  Constitutional:      Appearance: Normal appearance.  Neurological:     Mental Status: He is alert and oriented to person, place, and time.  Psychiatric:        Mood and Affect: Mood normal.     Right Shoulder Exam  Right shoulder exam is normal.  Tenderness  The patient is experiencing no tenderness.  Range of Motion  The patient has normal right shoulder ROM.  Muscle Strength  The patient has normal right shoulder strength.  Tests  Apprehension: negative  Other  Erythema: absent Sensation: normal Pulse: present   Left Shoulder Exam  Left shoulder exam is normal.  Tenderness  The patient is experiencing tenderness in the acromion and biceps tendon (deltoid).  Range of Motion  Active abduction: 50  Passive abduction: 90  External rotation: normal  Forward flexion: 60   Muscle Strength  The patient has normal left shoulder strength. Abduction: 3/5  Internal rotation: 5/5  External rotation: 5/5  Supraspinatus: 3/5  Subscapularis: 5/5  Biceps: 5/5   Tests  Apprehension: negative Cross arm: negative Impingement: positive Drop arm: positive Sulcus: absent  Other  Erythema: absent Sensation: normal Pulse: present         MEDICAL DECISION SECTION   Xrays were done at [facility] imaging to clavicle views and one [facility] of the shoulder  My independent reading of xrays:  No fracture dislocation seen in the clavicle or proximal humerus, the acromioclavicular joint is arthritic the acromion cannot be appropriately analyzed on the [facility] x-ray  [facility] reports from [facility] imaging   IMPRESSION: 1. No acute findings. 2. Moderate degenerative change of the left AC joint. 3. Osteophytosis involving the undersurface of the acromion could predispose the patient to shoulder impingement syndrome. Clinical  correlation is advised.     Electronically Signed   By: Sandi Mariscal M.D.   On: 12/28/2018 14:15   IMPRESSION: 1. No acute findings. 2. Moderate degenerative change of the left AC joint. 3. Osteophytosis involving the undersurface of the acromion could predispose the patient to shoulder impingement syndrome. Clinical correlation is advised.     Electronically Signed   By: Sandi Mariscal M.D.   On: 12/28/2018 14:14   Encounter Diagnoses  Name Primary?  . Traumatic complete tear of left rotator cuff, initial encounter Yes  . Acute pain of left shoulder     PLAN: (Rx., injectx, surgery, frx, mri/ct) MRI LEFT SHOULDER   Meds ordered this encounter  Medications  . HYDROcodone-acetaminophen (NORCO/VICODIN) 5-325 MG tablet    Sig: Take 1 tablet by mouth every 6 (six) hours as needed for up to 5 days for moderate pain.    Dispense:  20 tablet    Refill:  0  . ibuprofen (ADVIL) 800 MG tablet    Sig: Take 1 tablet (800 mg total) by mouth every 8 (eight) hours as needed.    Dispense:  30 tablet    Refill:  0    Arther Abbott, MD  01/07/2019 8:59 AM

## 2019-01-13 ENCOUNTER — Other Ambulatory Visit: Payer: Self-pay | Admitting: Orthopedic Surgery

## 2019-01-13 DIAGNOSIS — S46012A Strain of muscle(s) and tendon(s) of the rotator cuff of left shoulder, initial encounter: Secondary | ICD-10-CM

## 2019-01-13 MED ORDER — IBUPROFEN 800 MG PO TABS: 800.0000 mg | ORAL_TABLET | Freq: Three times a day (TID) | ORAL | 5 refills | Status: DC | PRN

## 2019-01-13 NOTE — Telephone Encounter (Addendum)
[name] called asking about his MRI appointment. He says he hasn't heard anything and his shoulder is really bothering him. Please call and advise.  He is also asking for a refill   Ibuprofen 800 MG  Qty 30 Tablets  Take 1 tablet(800 mg total) by mouth every 8 (eight) hours as needed.  PATIENT USES [facility] [place]

## 2019-01-13 NOTE — Telephone Encounter (Signed)
I gave him the number to call [facility]

## 2019-01-14 DIAGNOSIS — M0589 Other rheumatoid arthritis with rheumatoid factor of multiple sites: Secondary | ICD-10-CM | POA: Diagnosis not present

## 2019-01-14 DIAGNOSIS — Z79899 Other long term (current) drug therapy: Secondary | ICD-10-CM | POA: Diagnosis not present

## 2019-01-17 ENCOUNTER — Other Ambulatory Visit: Payer: Self-pay | Admitting: Orthopedic Surgery

## 2019-01-17 ENCOUNTER — Telehealth: Payer: Self-pay | Admitting: Orthopedic Surgery

## 2019-01-17 DIAGNOSIS — M25512 Pain in left shoulder: Secondary | ICD-10-CM

## 2019-01-17 MED ORDER — TRAMADOL-ACETAMINOPHEN 37.5-325 MG PO TABS: 1.0000 | ORAL_TABLET | ORAL | 0 refills | Status: AC | PRN

## 2019-01-17 NOTE — Telephone Encounter (Signed)
Patient called to inquire about whether Dr Aline Brochure would order another medication in addition to the Ibuprofen 800mg  which was ordered by Dr Aline Brochure 01/13/19; states the Ibuprofen is not helping enough with the pain. Pharmacy is [place]

## 2019-01-26 ENCOUNTER — Other Ambulatory Visit: Payer: Self-pay | Admitting: Orthopedic Surgery

## 2019-01-26 DIAGNOSIS — M25512 Pain in left shoulder: Secondary | ICD-10-CM

## 2019-01-28 DIAGNOSIS — Z8042 Family history of malignant neoplasm of prostate: Secondary | ICD-10-CM | POA: Diagnosis not present

## 2019-01-28 DIAGNOSIS — R972 Elevated prostate specific antigen [PSA]: Secondary | ICD-10-CM | POA: Diagnosis not present

## 2019-01-29 ENCOUNTER — Other Ambulatory Visit: Payer: Self-pay

## 2019-01-29 ENCOUNTER — Ambulatory Visit
Admission: RE | Admit: 2019-01-29 | Discharge: 2019-01-29 | Disposition: A | Discharge status: Home / Self Care | Payer: Medicare HMO | Source: Ambulatory Visit | Attending: Orthopedic Surgery | Admitting: Orthopedic Surgery

## 2019-01-29 DIAGNOSIS — M25512 Pain in left shoulder: Secondary | ICD-10-CM

## 2019-01-29 DIAGNOSIS — S46112A Strain of muscle, fascia and tendon of long head of biceps, left arm, initial encounter: Secondary | ICD-10-CM | POA: Diagnosis not present

## 2019-01-31 ENCOUNTER — Telehealth: Payer: Self-pay | Admitting: Radiology

## 2019-01-31 ENCOUNTER — Other Ambulatory Visit: Payer: Self-pay | Admitting: Orthopedic Surgery

## 2019-01-31 ENCOUNTER — Telehealth: Payer: Self-pay | Admitting: Orthopedic Surgery

## 2019-01-31 DIAGNOSIS — S46012A Strain of muscle(s) and tendon(s) of the rotator cuff of left shoulder, initial encounter: Secondary | ICD-10-CM

## 2019-01-31 MED ORDER — HYDROCODONE-ACETAMINOPHEN 5-325 MG PO TABS: 1.0000 | ORAL_TABLET | Freq: Four times a day (QID) | ORAL | 0 refills | Status: AC | PRN

## 2019-01-31 NOTE — Telephone Encounter (Addendum)
June 30th     He has requested pain meds, please advise

## 2019-01-31 NOTE — Telephone Encounter (Signed)
-----   Message from Carole Civil, MD sent at 01/31/2019 12:55 PM EDT ----- ARTHROSCOPY LEFT SHOULDER  ROTATOR CUFF REPAIR DISTAL CLAVICLE EXCISION DATE HIS CHOICE

## 2019-01-31 NOTE — Telephone Encounter (Signed)
  IMPRESSION: 1. Full-thickness full width rupture of the supraspinatus tendon with 2.4 cm retraction. This is possibly subacute given the edematous margins of the tendon. 2. Partial tearing of the intra-articular segment of the long head of the biceps. 3. Prominent infraspinatus and mild subscapularis tendinopathy. 4. Moderate degenerative AC joint arthropathy. 5. Small glenohumeral joint effusion.

## 2019-01-31 NOTE — Telephone Encounter (Signed)
Patient wanted to know if Dr. Aline Brochure had received his MRI report from [place]. He is also asking if he could get something for pain [facility] to [facility] [place] or can he come in for a shot or something.  Please call and advise

## 2019-01-31 NOTE — Telephone Encounter (Deleted)
-----   Message from Carole Civil, MD sent at 01/31/2019 12:55 PM EDT ----- ARTHROSCOPY LEFT SHOULDER  ROTATOR CUFF REPAIR DISTAL CLAVICLE EXCISION DATE HIS CHOICE

## 2019-02-04 DIAGNOSIS — H2513 Age-related nuclear cataract, bilateral: Secondary | ICD-10-CM | POA: Diagnosis not present

## 2019-02-04 DIAGNOSIS — H52203 Unspecified astigmatism, bilateral: Secondary | ICD-10-CM | POA: Diagnosis not present

## 2019-02-04 DIAGNOSIS — H5213 Myopia, bilateral: Secondary | ICD-10-CM | POA: Diagnosis not present

## 2019-02-04 DIAGNOSIS — E119 Type 2 diabetes mellitus without complications: Secondary | ICD-10-CM | POA: Diagnosis not present

## 2019-02-04 DIAGNOSIS — H524 Presbyopia: Secondary | ICD-10-CM | POA: Diagnosis not present

## 2019-02-07 NOTE — Patient Instructions (Signed)
Willie Howe  02/07/2019     @PREFPERIOPPHARMACY @   Your procedure is scheduled on  02/15/2019  Report to [name] at  615   A.M.  Call this number if you have problems the morning of surgery:  [phone]   Remember:  Do not eat or drink after midnight.                      Take these medicines the morning of surgery with A SIP OF WATER  Claritin, flomax.    Do not wear jewelry, make-up or nail polish.  Do not wear lotions, powders, or perfumes, or deodorant.  Do not shave 48 hours prior to surgery.  Men may shave face and neck.  Do not bring valuables to the hospital.  [facility] is not responsible for any belongings or valuables.  Contacts, dentures or bridgework may not be worn into surgery.  Leave your suitcase in the car.  After surgery it may be brought to your room.  For patients admitted to the hospital, discharge time will be determined by your treatment team.  Patients discharged the day of surgery will not be allowed to drive home.   Name and phone number of your driver:   family Special instructions:  None  Please read over the following fact sheets that you were given. Anesthesia Post-op Instructions and Care and Recovery After Surgery       Surgery for Rotator Cuff Tear, Care After Refer to this sheet in the next few weeks. These instructions provide you with information about caring for yourself after your procedure. Your health care provider may also give you more specific instructions. Your treatment has been planned according to current medical practices, but problems sometimes occur. Call your health care provider if you have any problems or questions after your procedure. What can I expect after the procedure? After the procedure, it is common to have:  Swelling.  Pain.  Stiffness.  Tenderness. Follow these instructions at home: If you have a sling:   Wear the sling as told by your health care provider. Remove it only as  told by your health care provider.  Loosen the sling if your fingers tingle, become numb, or turn cold and blue.  Do not let your sling get wet if it is not waterproof.  Keep the sling clean. Bathing  Do not take baths, swim, or use a hot tub until your health care provider approves. Ask your health care provider if you can take showers. You may only be allowed to take sponge baths for bathing.  Keep your bandage (dressing) dry until your health care provider says it can be removed. Incision care  Follow instructions from your health care provider about how to take care of your incision. Make sure you: ? Wash your hands with soap and water before you change your dressing. If soap and water are not available, use hand sanitizer. ? Change your dressing as told by your health care provider. ? Leave stitches (sutures), skin glue, or adhesive strips in place. These skin closures may need to stay in place for 2 weeks or longer. If adhesive strip edges start to loosen and curl up, you may trim the loose edges. Do not remove adhesive strips completely unless your health care provider tells you to do that.  Check your incision area every day for signs of infection. Check for: ? More redness, swelling, or pain. ?  More fluid or blood. ? Warmth. ? Pus or a bad smell. Managing pain, stiffness, and swelling   If directed, put ice on your shoulder area. ? Put ice in a plastic bag. ? Place a towel between your skin and the bag. ? Leave the ice on for 20 minutes, 2-3 times a day.  Move your fingers often to avoid stiffness and to lessen swelling.  Raise (elevate) your upper body on pillows when you lie down and when you sleep. ? Do not sleep on the front of your body (abdomen). ? Do not sleep on the side that your surgery was performed on. Driving  Do not drive for 24 hours if you received a medicine to help you relax (sedative) during your procedure.  Do not drive or operate heavy machinery  while taking prescription pain medicine.  Ask your health care provider when it is safe for you to drive. Activity  Do not use your arm to support your body weight until your health care provider approves.  Do not lift or hold anything with your arm until your health care provider approves.  Return to your normal activities as told by your health care provider. Ask your health care provider what activities are safe for you.  Do exercises as told by your health care provider. General instructions   Do not use any tobacco products, such as cigarettes, chewing tobacco, or e-cigarettes. Tobacco can delay healing. If you need help quitting, ask your health care provider.  Take over-the-counter and prescription medicines only as told by your health care provider.  If you were prescribed an antibiotic medicine, take it as told by your health care provider. Do not stop taking the antibiotic even if you start to feel better.  Keep all follow-up visits as told by your health care provider. This is important. Contact a health care provider if:  You have a fever.  You have more redness, swelling, or pain around your incision.  You have more fluid or blood coming from your incision.  Your incision feels warm to the touch.  You have pus or a bad smell coming from your incision.  You have pain that gets worse or does not get better with medicine. Get help right away if:  You have severe pain.  You lose feeling in your arm or hand.  Your hand or fingers turn very pale or blue. This information is not intended to replace advice given to you by your health care provider. Make sure you discuss any questions you have with your health care provider. Document Released: 08/04/2005 Document Revised: 04/02/2018 Document Reviewed: 08/18/2015 Elsevier Interactive Patient Education  2019 [place] Anesthesia, Adult, Care After This sheet gives you information about how to care for  yourself after your procedure. Your health care provider may also give you more specific instructions. If you have problems or questions, contact your health care provider. What can I expect after the procedure? After the procedure, the following side effects are common:  Pain or discomfort at the IV site.  Nausea.  Vomiting.  Sore throat.  Trouble concentrating.  Feeling cold or chills.  Weak or tired.  Sleepiness and fatigue.  Soreness and body aches. These side effects can affect parts of the body that were not involved in surgery. Follow these instructions at home:  For at least 24 hours after the procedure:  Have a responsible adult stay with you. It is important to have someone help care for you until  you are awake and alert.  Rest as needed.  Do not: ? Participate in activities in which you could fall or become injured. ? Drive. ? Use heavy machinery. ? Drink alcohol. ? Take sleeping pills or medicines that cause drowsiness. ? Make important decisions or sign legal documents. ? Take care of children on your own. Eating and drinking  Follow any instructions from your health care provider about eating or drinking restrictions.  When you feel hungry, start by eating small amounts of foods that are soft and easy to digest (bland), such as toast. Gradually return to your regular diet.  Drink enough fluid to keep your urine pale yellow.  If you vomit, rehydrate by drinking water, juice, or clear broth. General instructions  If you have sleep apnea, surgery and certain medicines can increase your risk for breathing problems. Follow instructions from your health care provider about wearing your sleep device: ? Anytime you are sleeping, including during daytime naps. ? While taking prescription pain medicines, sleeping medicines, or medicines that make you drowsy.  Return to your normal activities as told by your health care provider. Ask your health care provider  what activities are safe for you.  Take over-the-counter and prescription medicines only as told by your health care provider.  If you smoke, do not smoke without supervision.  Keep all follow-up visits as told by your health care provider. This is important. Contact a health care provider if:  You have nausea or vomiting that does not get better with medicine.  You cannot eat or drink without vomiting.  You have pain that does not get better with medicine.  You are unable to pass urine.  You develop a skin rash.  You have a fever.  You have redness around your IV site that gets worse. Get help right away if:  You have difficulty breathing.  You have chest pain.  You have blood in your urine or stool, or you vomit blood. Summary  After the procedure, it is common to have a sore throat or nausea. It is also common to feel tired.  Have a responsible adult stay with you for the first 24 hours after general anesthesia. It is important to have someone help care for you until you are awake and alert.  When you feel hungry, start by eating small amounts of foods that are soft and easy to digest (bland), such as toast. Gradually return to your regular diet.  Drink enough fluid to keep your urine pale yellow.  Return to your normal activities as told by your health care provider. Ask your health care provider what activities are safe for you. This information is not intended to replace advice given to you by your health care provider. Make sure you discuss any questions you have with your health care provider. Document Released: 11/10/2000 Document Revised: 03/20/2017 Document Reviewed: 03/20/2017 Elsevier Interactive Patient Education  2019 [place]. How to Use Chlorhexidine Before Surgery Chlorhexidine gluconate (CHG) is a germ-killing (antiseptic) solution that is used to clean the skin. It gets rid of the bacteria that normally live on the skin. Cleaning your skin with CHG  before surgery helps lower the risk for infection after surgery. To clean your skin before surgery, you may be given:  A CHG solution to use in the shower.  A prepackaged cloth that contains CHG. What are the risks? Risks of using CHG include:  A skin reaction.  Hearing loss, if CHG gets in your ears.  Eye  injury, if CHG gets in your eyes and is not rinsed out.  The CHG product catching fire. Make sure that you avoid smoking and flames after applying CHG to your skin. Do not use CHG:  If you have a chlorhexidine allergy or have previously reacted to chlorhexidine.  On babies younger than 78 months of age. How to use CHG solution   Use CHG only as told by your health care provider, and follow the instructions on the label.  Use CHG solution while taking a shower. Follow these steps when using CHG solution (unless your health care provider gives you different instructions): 1. Start the shower. 2. Use your normal soap and shampoo to wash your face and hair. 3. Turn off the shower or move out of the shower stream. 4. Pour the CHG onto a clean washcloth. Do not use any type of brush or rough-edged sponge. 5. Starting at your neck, lather your body down to your toes. Make sure you:  Pay special attention to the part of your body where you will be having surgery. Scrub this area for at least 1 minute.  Use the full amount of CHG as directed. Usually, this is one bottle.  Do not use CHG on your head or face. If the solution gets into your ears or eyes, rinse them well with water.  Avoid your genital area.  Avoid any areas of skin that have broken skin, cuts, or scrapes.  Scrub your back and under your arms. Make sure to wash skin folds. 6. Let the lather sit on your skin for 1-2 minutes or as long as told by your health care provider. 7. Thoroughly rinse your entire body in the shower. Make sure that all body creases and crevices are rinsed well. 8. Dry off with a clean towel.  Do not put any substances on your body afterward, such as powder, lotion, or perfume. 9. Put on clean clothes or pajamas. 10. If it is the night before your surgery, sleep in clean sheets. How to use CHG prepackaged cloths   Only use CHG cloths as told by your health care provider, and follow the instructions on the label.  Use the CHG cloth on clean, dry skin. Follow these steps when using a CHG cloth (unless your health care provider gives you different instructions): 1. Using the CHG cloth, vigorously scrub the part of your body where you will be having surgery. Scrub using a back-and-forth motion for 3 minutes. The area on your body should be completely wet with CHG when you are done scrubbing. 2. Do not rinse. Discard the cloth and let the area air-dry for 1 minute. Do not put any substances on your body afterward, such as powder, lotion, or perfume. 3. Put on clean clothes or pajamas. 4. If it is the night before your surgery, sleep in clean sheets. Contact a health care provider if:  Your skin gets irritated after scrubbing.  You have questions about using your solution or cloth. Get help right away if:  Your eyes become very red or swollen.  Your eyes itch badly.  Your skin itches badly and is red or swollen.  Your hearing changes.  You have trouble seeing.  You have swelling or tingling in your mouth or throat.  You have trouble breathing.  You swallow any chlorhexidine. Summary  Chlorhexidine gluconate (CHG) is a germ-killing (antiseptic) solution that is used to clean the skin. Cleaning your skin with CHG before surgery helps lower the risk for  infection after surgery.  You may be given CHG to use at home. It may be in a bottle or in a prepackaged cloth to use on your skin. Carefully follow your health care provider's instructions and the instructions on the product label.  Do not use CHG if you have a chlorhexidine allergy.  Contact your health care provider if  your skin gets irritated after scrubbing. This information is not intended to replace advice given to you by your health care provider. Make sure you discuss any questions you have with your health care provider. Document Released: 04/28/2012 Document Revised: 07/02/2017 Document Reviewed: 07/02/2017 Elsevier Interactive Patient Education  2019 [employer].

## 2019-02-08 ENCOUNTER — Other Ambulatory Visit: Payer: Self-pay | Admitting: Radiology

## 2019-02-08 ENCOUNTER — Telehealth: Payer: Self-pay | Admitting: Radiology

## 2019-02-08 NOTE — Telephone Encounter (Signed)
Rx refill request.   [facility].

## 2019-02-08 NOTE — Telephone Encounter (Signed)
Ref # [id number] there is not authorization required for codes [phone] and [phone] if we are participating providers, which Dr Aline Brochure is. Ref # [id number] for the call, both codes same ref #

## 2019-02-09 MED ORDER — HYDROCODONE-ACETAMINOPHEN 5-325 MG PO TABS: 1.0000 | ORAL_TABLET | Freq: Four times a day (QID) | ORAL | 0 refills | Status: DC | PRN

## 2019-02-11 ENCOUNTER — Encounter (HOSPITAL_COMMUNITY)
Admission: RE | Admit: 2019-02-11 | Discharge: 2019-02-11 | Disposition: A | Discharge status: Home / Self Care | Payer: Medicare HMO | Source: Ambulatory Visit | Attending: Orthopedic Surgery | Admitting: Orthopedic Surgery

## 2019-02-11 ENCOUNTER — Other Ambulatory Visit: Payer: Self-pay

## 2019-02-11 ENCOUNTER — Telehealth: Payer: Self-pay | Admitting: Orthopedic Surgery

## 2019-02-11 ENCOUNTER — Encounter (HOSPITAL_COMMUNITY): Payer: Self-pay

## 2019-02-11 ENCOUNTER — Other Ambulatory Visit (HOSPITAL_COMMUNITY)
Admission: RE | Admit: 2019-02-11 | Discharge: 2019-02-11 | Disposition: A | Discharge status: Home / Self Care | Payer: Medicare HMO | Source: Ambulatory Visit | Attending: Orthopedic Surgery | Admitting: Orthopedic Surgery

## 2019-02-11 DIAGNOSIS — Z86718 Personal history of other venous thrombosis and embolism: Secondary | ICD-10-CM | POA: Diagnosis not present

## 2019-02-11 DIAGNOSIS — Z01812 Encounter for preprocedural laboratory examination: Secondary | ICD-10-CM | POA: Insufficient documentation

## 2019-02-11 DIAGNOSIS — Z86711 Personal history of pulmonary embolism: Secondary | ICD-10-CM | POA: Diagnosis not present

## 2019-02-11 DIAGNOSIS — M75122 Complete rotator cuff tear or rupture of left shoulder, not specified as traumatic: Secondary | ICD-10-CM | POA: Diagnosis not present

## 2019-02-11 DIAGNOSIS — Z1159 Encounter for screening for other viral diseases: Secondary | ICD-10-CM | POA: Insufficient documentation

## 2019-02-11 DIAGNOSIS — Z87891 Personal history of nicotine dependence: Secondary | ICD-10-CM | POA: Insufficient documentation

## 2019-02-11 DIAGNOSIS — Z87442 Personal history of urinary calculi: Secondary | ICD-10-CM | POA: Diagnosis not present

## 2019-02-11 DIAGNOSIS — G4730 Sleep apnea, unspecified: Secondary | ICD-10-CM | POA: Diagnosis not present

## 2019-02-11 DIAGNOSIS — M069 Rheumatoid arthritis, unspecified: Secondary | ICD-10-CM | POA: Diagnosis not present

## 2019-02-11 DIAGNOSIS — E0500 Thyrotoxicosis with diffuse goiter without thyrotoxic crisis or storm: Secondary | ICD-10-CM | POA: Diagnosis not present

## 2019-02-11 DIAGNOSIS — Z79899 Other long term (current) drug therapy: Secondary | ICD-10-CM | POA: Diagnosis not present

## 2019-02-11 LAB — CBC WITH DIFFERENTIAL/PLATELET
Abs Immature Granulocytes: 0.02 10*3/uL (ref 0.00–0.07)
Basophils Absolute: 0 10*3/uL (ref 0.0–0.1)
Basophils Relative: 0 %
Eosinophils Absolute: 0.1 10*3/uL (ref 0.0–0.5)
Eosinophils Relative: 1 %
HCT: 42.5 % (ref 39.0–52.0)
Hemoglobin: 13.4 g/dL (ref 13.0–17.0)
Immature Granulocytes: 0 %
Lymphocytes Relative: 40 %
Lymphs Abs: 3.2 10*3/uL (ref 0.7–4.0)
MCH: 29.7 pg (ref 26.0–34.0)
MCHC: 31.5 g/dL (ref 30.0–36.0)
MCV: 94.2 fL (ref 80.0–100.0)
Monocytes Absolute: 0.6 10*3/uL (ref 0.1–1.0)
Monocytes Relative: 7 %
Neutro Abs: 4.1 10*3/uL (ref 1.7–7.7)
Neutrophils Relative %: 52 %
Platelets: 277 10*3/uL (ref 150–400)
RBC: 4.51 MIL/uL (ref 4.22–5.81)
RDW: 15.2 % (ref 11.5–15.5)
WBC: 8 10*3/uL (ref 4.0–10.5)
nRBC: 0 % (ref 0.0–0.2)

## 2019-02-11 LAB — BASIC METABOLIC PANEL
Anion gap: 9 (ref 5–15)
BUN: 13 mg/dL (ref 6–20)
CO2: 23 mmol/L (ref 22–32)
Calcium: 8.9 mg/dL (ref 8.9–10.3)
Chloride: 107 mmol/L (ref 98–111)
Creatinine, Ser: 0.79 mg/dL (ref 0.61–1.24)
GFR calc Af Amer: >60 mL/min (ref >60)
GFR calc non Af Amer: >60 mL/min (ref >60)
Glucose, Bld: 107 mg/dL — ABNORMAL HIGH (ref 70–99)
Potassium: 3.6 mmol/L (ref 3.5–5.1)
Sodium: 139 mmol/L (ref 135–145)

## 2019-02-11 LAB — GLUCOSE, CAPILLARY: Glucose-Capillary: 102 mg/dL — ABNORMAL HIGH (ref 70–99)

## 2019-02-11 NOTE — Telephone Encounter (Signed)
Discussed with [name] upcoming surgery findings procedure planned operative and postoperative course  Patient understands wishes to proceed with surgery

## 2019-02-12 LAB — HEMOGLOBIN A1C
Hgb A1c MFr Bld: 6.3 % — ABNORMAL HIGH (ref 4.8–5.6)
Mean Plasma Glucose: 134 mg/dL

## 2019-02-12 LAB — NOVEL CORONAVIRUS, NAA (HOSP ORDER, SEND-OUT TO REF LAB; TAT 18-24 HRS): SARS-CoV-2, NAA: NOT DETECTED

## 2019-02-14 NOTE — H&P (Signed)
Chief Complaint  Patient presents with  . Shoulder Pain      left shoulder pain s/p fall 12/24/18      58 yo male traumatic injury left shoulder 2 weeks ago (5/8)    Since that time cannot raise left arm    Pain left shoulder 8/10 Sharp Loss of motion          Review of Systems  Musculoskeletal: Positive for joint pain.  Endo/Heme/Allergies: Negative for environmental allergies.  All other systems reviewed and are negative.           Past Medical History:  Diagnosis Date  . Anginal pain ([place])    . Arthritis    . BPPV (benign paroxysmal positional vertigo)    . DVT (deep venous thrombosis) ([place])    . Dysrhythmia    . Graves disease 1996  . History of kidney stones    . History of pulmonary embolism    . Obesity    . Previous back surgery      x 3  . Rheumatoid arthritis ([place])    . Sleep apnea    . Thyroid disease      Graves Disease          Past Surgical History:  Procedure Laterality Date  . BACK SURGERY      . COLONOSCOPY      . EXAM UNDER ANESTHESIA WITH MANIPULATION OF KNEE Right 04/18/2015    Procedure: MANIPULATION OF RIGHT KNEE UNDER ANESTHESIA;  Surgeon: Carole Civil, MD;  Location: [facility];  Service: Orthopedics;  Laterality: Right;  . IVC Filter        removed December 2016  . [place]   2010  . KNEE ARTHROSCOPY Right 2006    meniscus   . KNEE ARTHROSCOPY WITH MEDIAL MENISECTOMY Left 04/24/2016    Procedure: LEFT KNEE ARTHROSCOPY WITH PARTIAL MEDIAL MENISECTOMY;  Surgeon: Carole Civil, MD;  Location: [facility];  Service: Orthopedics;  Laterality: Left;  . KNEE SURGERY        x 2  . KNEE SURGERY Left 1976    ?ligament repair   . LUMBAR WOUND DEBRIDEMENT N/A 02/23/2014    Procedure: LUMBAR WOUND DEBRIDEMENT;  Surgeon: Faythe Ghee, MD;  Location: [facility];  Service: Neurosurgery;  Laterality: N/A;  exploration lumbar wound. repair of dural defect.  Marland Kitchen MENISECTOMY Right      open medial   . NEPHROLITHOTOMY      . PERIPHERAL  VASCULAR CATHETERIZATION N/A 01/30/2015    Procedure: IVC Filter Insertion;  Surgeon: Serafina Mitchell, MD;  Location: [facility];  Service: Cardiovascular;  Laterality: N/A;  . PERIPHERAL VASCULAR CATHETERIZATION N/A 04/24/2015    Procedure: IVC Filter Removal;  Surgeon: Serafina Mitchell, MD;  Location: [facility];  Service: Cardiovascular;  Laterality: N/A;  . PERIPHERAL VASCULAR CATHETERIZATION N/A 08/14/2015    Procedure: IVC Filter Removal;  Surgeon: Serafina Mitchell, MD;  Location: [facility];  Service: Cardiovascular;  Laterality: N/A;  . SPINE SURGERY   2015    Dr Hal Neer Fusion   . SPINE SURGERY   1999    discectomy  . TOTAL KNEE ARTHROPLASTY Right 02/02/2015    Procedure:  RIGHT TOTAL KNEE ARTHROPLASTY;  Surgeon: Carole Civil, MD;  Location: [facility];  Service: Orthopedics;  Laterality: Right;  LM with pt's daughter of new arrival time (10:45)    . TOTAL KNEE ARTHROPLASTY Left 07/24/2016    Procedure: TOTAL  KNEE ARTHROPLASTY;  Surgeon: Carole Civil, MD;  Location: [facility];  Service: Orthopedics;  Laterality: Left;          Family History  Problem Relation Age of Onset  . Deep vein thrombosis Mother    . Hypertension Mother    . Breast cancer Mother    . Hypertension Father    . Prostate cancer Father    . CAD Sister     Social History         Tobacco Use  . Smoking status: Former Smoker      Packs/day: 1.50      Years: 20.00      Pack years: 30.00      Types: Cigarettes      Last attempt to quit: 02/03/1995      Years since quitting: 23.9  . Smokeless tobacco: Never Used  Substance Use Topics  . Alcohol use: No      Alcohol/week: 0.0 standard drinks  . Drug use: No         Allergies  Allergen Reactions  . Lisinopril Cough     Active Medications      Current Meds  Medication Sig  . acetaminophen (TYLENOL) 325 MG tablet Take 650 mg by mouth every 6 (six) hours as needed.  Mariane Baumgarten Calcium (STOOL SOFTENER PO) Take 1 capsule by  mouth daily.   . folic acid (FOLVITE) 1 MG tablet Take 1 mg by mouth daily.  . furosemide (LASIX) 20 MG tablet TAKE 1 TABLET (20 MG TOTAL) BY MOUTH DAILY.  . inFLIXimab (REMICADE) 100 MG injection Inject 100 mg into the vein. Every 6 to 8 weeks  . loratadine (CLARITIN) 10 MG tablet Take 10 mg by mouth daily as needed for allergies.   . metFORMIN (GLUCOPHAGE) 500 MG tablet Take 500 mg by mouth daily with breakfast.   . methotrexate (RHEUMATREX) 2.5 MG tablet Take 20 mg by mouth once a week. 8 tablets on Friday. Caution:Chemotherapy. Protect from light.  . tamsulosin (FLOMAX) 0.4 MG CAPS capsule Take 0.4 mg by mouth daily.       BP (!) 151/91   Pulse 83   Temp (!) 97 F (36.1 C)   Ht 6\' 3"  (1.905 m)   Wt 283 lb (128.4 kg)   BMI 35.37 kg/m    Physical Exam Vitals signs and nursing note reviewed.  Constitutional:      Appearance: Normal appearance.  Neurological:     Mental Status: He is alert and oriented to person, place, and time.  Psychiatric:        Mood and Affect: Mood normal.        Right Shoulder Exam  Right shoulder exam is normal.   Tenderness  The patient is experiencing no tenderness.   Range of Motion  The patient has normal right shoulder ROM.   Muscle Strength  The patient has normal right shoulder strength.   Tests  Apprehension: negative   Other  Erythema: absent Sensation: normal Pulse: present     Left Shoulder Exam  Left shoulder exam is normal.   Tenderness  The patient is experiencing tenderness in the acromion and biceps tendon (deltoid).   Range of Motion  Active abduction: 50  Passive abduction: 90  External rotation: normal  Forward flexion: 60    Muscle Strength  The patient has normal left shoulder strength. Abduction: 3/5  Internal rotation: 5/5  External rotation: 5/5  Supraspinatus: 3/5  Subscapularis: 5/5  Biceps: 5/5  Tests  Apprehension: negative Cross arm: negative Impingement: positive Drop arm: positive  Sulcus: absent   Other  Erythema: absent Sensation: normal Pulse: present                MEDICAL DECISION SECTION  Xrays were done at [facility] imaging to clavicle views and one [facility] of the shoulder   My independent reading of xrays:  No fracture dislocation seen in the clavicle or proximal humerus, the acromioclavicular joint is arthritic the acromion cannot be appropriately analyzed on the [facility] x-ray   X-ray reports from [facility] imaging   IMPRESSION: 1. No acute findings. 2. Moderate degenerative change of the left AC joint. 3. Osteophytosis involving the undersurface of the acromion could predispose the patient to shoulder impingement syndrome. Clinical correlation is advised.     Electronically Signed   By: Sandi Mariscal M.D.   On: 12/28/2018 14:15   IMPRESSION: 1. No acute findings. 2. Moderate degenerative change of the left AC joint. 3. Osteophytosis involving the undersurface of the acromion could predispose the patient to shoulder impingement syndrome. Clinical correlation is advised.     Electronically Signed   By: Sandi Mariscal M.D.   On: 12/28/2018 14:14    CLINICAL DATA:  Left shoulder pain with reduced range of motion. Fall 6 weeks ago.   EXAM: MRI OF THE LEFT SHOULDER WITHOUT CONTRAST   TECHNIQUE: Multiplanar, multisequence MR imaging of the shoulder was performed. No intravenous contrast was administered.   COMPARISON:  Radiographs from 12/28/2018   FINDINGS: Rotator cuff: Full-thickness full width rupture of the supraspinatus tendon, retracted 2.4 cm, with edematous margins of the tendon and a small segment of distal tendon remaining.   Prominent infraspinatus and mild subscapularis tendinopathy.   Muscles:  Unremarkable   Biceps long head: Expanded intra-articular segment with abnormal accentuated signal compatible with partial tearing.   Acromioclavicular Joint: Moderate spurring at the [facility] joint with a small amount of fluid in the  joint and moderate subcortical marrow edema. Type I acromion. They are spurring along the acromial attachment of the coracoacromial ligament. As expected given the rotator cuff tear, there is fluid in the subacromial subdeltoid bursa.   Glenohumeral Joint: Small joint effusion distending the axillary pouch. Degenerative subcortical cyst formation posteriorly along the humeral head near the greater tuberosity.   Labrum:  Grossly unremarkable   Bones: No significant extra-articular osseous abnormalities identified.   Other: No supplemental non-categorized findings.   IMPRESSION: 1. Full-thickness full width rupture of the supraspinatus tendon with 2.4 cm retraction. This is possibly subacute given the edematous margins of the tendon. 2. Partial tearing of the intra-articular segment of the long head of the biceps. 3. Prominent infraspinatus and mild subscapularis tendinopathy. 4. Moderate degenerative AC joint arthropathy. 5. Small glenohumeral joint effusion.     Electronically Signed   By: Van Clines M.D.   On: 01/30/2019 10:45      Encounter Diagnoses  Name Primary?  . Traumatic complete tear of left rotator cuff, initial encounter    Torn rotator cuff left shoulder  Arthroscopic left rotator cuff repair

## 2019-02-14 NOTE — Pre-Procedure Instructions (Signed)
HgbA1C routed to PCP. 

## 2019-02-15 ENCOUNTER — Encounter (HOSPITAL_COMMUNITY)
Admission: RE | Disposition: A | Discharge status: Home / Self Care | Payer: Self-pay | Source: Home / Self Care | Attending: Orthopedic Surgery

## 2019-02-15 ENCOUNTER — Ambulatory Visit (HOSPITAL_COMMUNITY): Payer: Medicare HMO | Admitting: Anesthesiology

## 2019-02-15 ENCOUNTER — Ambulatory Visit (HOSPITAL_COMMUNITY)
Admission: RE | Admit: 2019-02-15 | Discharge: 2019-02-15 | Disposition: A | Discharge status: Home / Self Care | Payer: Medicare HMO | Attending: Orthopedic Surgery | Admitting: Orthopedic Surgery

## 2019-02-15 ENCOUNTER — Encounter (HOSPITAL_COMMUNITY): Payer: Self-pay | Admitting: Anesthesiology

## 2019-02-15 DIAGNOSIS — M069 Rheumatoid arthritis, unspecified: Secondary | ICD-10-CM | POA: Insufficient documentation

## 2019-02-15 DIAGNOSIS — I4510 Unspecified right bundle-branch block: Secondary | ICD-10-CM | POA: Diagnosis not present

## 2019-02-15 DIAGNOSIS — W19XXXA Unspecified fall, initial encounter: Secondary | ICD-10-CM | POA: Diagnosis not present

## 2019-02-15 DIAGNOSIS — Z86718 Personal history of other venous thrombosis and embolism: Secondary | ICD-10-CM | POA: Insufficient documentation

## 2019-02-15 DIAGNOSIS — Z888 Allergy status to other drugs, medicaments and biological substances status: Secondary | ICD-10-CM | POA: Insufficient documentation

## 2019-02-15 DIAGNOSIS — R51 Headache: Secondary | ICD-10-CM | POA: Insufficient documentation

## 2019-02-15 DIAGNOSIS — G4730 Sleep apnea, unspecified: Secondary | ICD-10-CM | POA: Insufficient documentation

## 2019-02-15 DIAGNOSIS — Z6835 Body mass index (BMI) 35.0-35.9, adult: Secondary | ICD-10-CM | POA: Insufficient documentation

## 2019-02-15 DIAGNOSIS — Z96651 Presence of right artificial knee joint: Secondary | ICD-10-CM | POA: Diagnosis not present

## 2019-02-15 DIAGNOSIS — Z7984 Long term (current) use of oral hypoglycemic drugs: Secondary | ICD-10-CM | POA: Insufficient documentation

## 2019-02-15 DIAGNOSIS — M19012 Primary osteoarthritis, left shoulder: Secondary | ICD-10-CM | POA: Insufficient documentation

## 2019-02-15 DIAGNOSIS — H8110 Benign paroxysmal vertigo, unspecified ear: Secondary | ICD-10-CM | POA: Insufficient documentation

## 2019-02-15 DIAGNOSIS — S46012D Strain of muscle(s) and tendon(s) of the rotator cuff of left shoulder, subsequent encounter: Secondary | ICD-10-CM

## 2019-02-15 DIAGNOSIS — Z981 Arthrodesis status: Secondary | ICD-10-CM | POA: Diagnosis not present

## 2019-02-15 DIAGNOSIS — E0500 Thyrotoxicosis with diffuse goiter without thyrotoxic crisis or storm: Secondary | ICD-10-CM | POA: Insufficient documentation

## 2019-02-15 DIAGNOSIS — S4382XD Sprain of other specified parts of left shoulder girdle, subsequent encounter: Secondary | ICD-10-CM

## 2019-02-15 DIAGNOSIS — S46112D Strain of muscle, fascia and tendon of long head of biceps, left arm, subsequent encounter: Secondary | ICD-10-CM

## 2019-02-15 DIAGNOSIS — I499 Cardiac arrhythmia, unspecified: Secondary | ICD-10-CM | POA: Diagnosis not present

## 2019-02-15 DIAGNOSIS — M75122 Complete rotator cuff tear or rupture of left shoulder, not specified as traumatic: Secondary | ICD-10-CM | POA: Diagnosis not present

## 2019-02-15 DIAGNOSIS — Z87442 Personal history of urinary calculi: Secondary | ICD-10-CM | POA: Insufficient documentation

## 2019-02-15 DIAGNOSIS — E119 Type 2 diabetes mellitus without complications: Secondary | ICD-10-CM | POA: Insufficient documentation

## 2019-02-15 DIAGNOSIS — S4382XA Sprain of other specified parts of left shoulder girdle, initial encounter: Secondary | ICD-10-CM

## 2019-02-15 DIAGNOSIS — Z8249 Family history of ischemic heart disease and other diseases of the circulatory system: Secondary | ICD-10-CM | POA: Insufficient documentation

## 2019-02-15 DIAGNOSIS — Z87891 Personal history of nicotine dependence: Secondary | ICD-10-CM | POA: Insufficient documentation

## 2019-02-15 DIAGNOSIS — E669 Obesity, unspecified: Secondary | ICD-10-CM | POA: Insufficient documentation

## 2019-02-15 DIAGNOSIS — Z8042 Family history of malignant neoplasm of prostate: Secondary | ICD-10-CM | POA: Diagnosis not present

## 2019-02-15 DIAGNOSIS — Z79899 Other long term (current) drug therapy: Secondary | ICD-10-CM | POA: Insufficient documentation

## 2019-02-15 DIAGNOSIS — Z803 Family history of malignant neoplasm of breast: Secondary | ICD-10-CM | POA: Diagnosis not present

## 2019-02-15 DIAGNOSIS — S46012A Strain of muscle(s) and tendon(s) of the rotator cuff of left shoulder, initial encounter: Secondary | ICD-10-CM

## 2019-02-15 DIAGNOSIS — S46112A Strain of muscle, fascia and tendon of long head of biceps, left arm, initial encounter: Secondary | ICD-10-CM

## 2019-02-15 DIAGNOSIS — S46812A Strain of other muscles, fascia and tendons at shoulder and upper arm level, left arm, initial encounter: Secondary | ICD-10-CM

## 2019-02-15 DIAGNOSIS — M75102 Unspecified rotator cuff tear or rupture of left shoulder, not specified as traumatic: Secondary | ICD-10-CM | POA: Diagnosis not present

## 2019-02-15 DIAGNOSIS — M7552 Bursitis of left shoulder: Secondary | ICD-10-CM | POA: Diagnosis not present

## 2019-02-15 DIAGNOSIS — Z86711 Personal history of pulmonary embolism: Secondary | ICD-10-CM | POA: Insufficient documentation

## 2019-02-15 HISTORY — PX: SHOULDER ARTHROSCOPY WITH ROTATOR CUFF REPAIR AND OPEN BICEPS TENODESIS: SHX6677

## 2019-02-15 LAB — GLUCOSE, CAPILLARY
Glucose-Capillary: 101 mg/dL — ABNORMAL HIGH (ref 70–99)
Glucose-Capillary: 133 mg/dL — ABNORMAL HIGH (ref 70–99)

## 2019-02-15 SURGERY — SHOULDER ARTHROSCOPY WITH ROTATOR CUFF REPAIR AND OPEN BICEPS TENODESIS
Anesthesia: General | Site: Shoulder | Laterality: Left

## 2019-02-15 MED ORDER — OXYCODONE HCL 5 MG PO TABS
ORAL_TABLET | ORAL | Status: AC
  Filled 2019-02-15: qty 1

## 2019-02-15 MED ORDER — CEFAZOLIN SODIUM-DEXTROSE 2-4 GM/100ML-% IV SOLN
INTRAVENOUS | Status: AC
  Filled 2019-02-15: qty 100

## 2019-02-15 MED ORDER — LIDOCAINE 2% (20 MG/ML) 5 ML SYRINGE
INTRAMUSCULAR | Status: AC
  Filled 2019-02-15: qty 5

## 2019-02-15 MED ORDER — CHLORHEXIDINE GLUCONATE 4 % EX LIQD: 60.0000 mL | Freq: Once | CUTANEOUS | Status: DC

## 2019-02-15 MED ORDER — OXYCODONE-ACETAMINOPHEN 5-325 MG PO TABS: 1.0000 | ORAL_TABLET | ORAL | 0 refills | Status: AC | PRN

## 2019-02-15 MED ORDER — ONDANSETRON HCL 4 MG/2ML IJ SOLN
INTRAMUSCULAR | Status: AC
  Filled 2019-02-15: qty 4

## 2019-02-15 MED ORDER — HYDROMORPHONE HCL 1 MG/ML IJ SOLN
INTRAMUSCULAR | Status: AC
  Filled 2019-02-15: qty 1

## 2019-02-15 MED ORDER — METHOCARBAMOL 500 MG PO TABS: 500.0000 mg | ORAL_TABLET | Freq: Four times a day (QID) | ORAL | 2 refills | Status: DC

## 2019-02-15 MED ORDER — SUGAMMADEX SODIUM 200 MG/2ML IV SOLN
INTRAVENOUS | Status: DC | PRN
  Administered 2019-02-15: 256.8 mg via INTRAVENOUS

## 2019-02-15 MED ORDER — MIDAZOLAM HCL 2 MG/2ML IJ SOLN: 0.5000 mg | Freq: Once | INTRAMUSCULAR | Status: DC | PRN

## 2019-02-15 MED ORDER — ROCURONIUM 10MG/ML (10ML) SYRINGE FOR MEDFUSION PUMP - OPTIME
INTRAVENOUS | Status: DC | PRN
  Administered 2019-02-15 (×3): 10 mg via INTRAVENOUS
  Administered 2019-02-15: 20 mg via INTRAVENOUS
  Administered 2019-02-15 (×2): 10 mg via INTRAVENOUS

## 2019-02-15 MED ORDER — IBUPROFEN 800 MG PO TABS: 800.0000 mg | ORAL_TABLET | Freq: Three times a day (TID) | ORAL | 1 refills | Status: DC | PRN

## 2019-02-15 MED ORDER — HYDROCORTISONE NA SUCCINATE PF 100 MG IJ SOLR
INTRAMUSCULAR | Status: DC | PRN
  Administered 2019-02-15: 100 mg via INTRAVENOUS

## 2019-02-15 MED ORDER — BUPIVACAINE-EPINEPHRINE 0.5% -1:200000 IJ SOLN
INTRAMUSCULAR | Status: DC | PRN
  Administered 2019-02-15: 20 mL

## 2019-02-15 MED ORDER — HYDROCODONE-ACETAMINOPHEN 7.5-325 MG PO TABS: 1.0000 | ORAL_TABLET | Freq: Once | ORAL | Status: DC | PRN

## 2019-02-15 MED ORDER — BUPIVACAINE-EPINEPHRINE (PF) 0.5% -1:200000 IJ SOLN
INTRAMUSCULAR | Status: AC
  Filled 2019-02-15: qty 30

## 2019-02-15 MED ORDER — FENTANYL CITRATE (PF) 100 MCG/2ML IJ SOLN
INTRAMUSCULAR | Status: AC
  Filled 2019-02-15: qty 2

## 2019-02-15 MED ORDER — SUCCINYLCHOLINE CHLORIDE 200 MG/10ML IV SOSY
PREFILLED_SYRINGE | INTRAVENOUS | Status: AC
  Filled 2019-02-15: qty 10

## 2019-02-15 MED ORDER — EPHEDRINE 5 MG/ML INJ
INTRAVENOUS | Status: AC
  Filled 2019-02-15: qty 10

## 2019-02-15 MED ORDER — CEFAZOLIN SODIUM-DEXTROSE 1-4 GM/50ML-% IV SOLN
INTRAVENOUS | Status: AC
  Filled 2019-02-15: qty 50

## 2019-02-15 MED ORDER — SEVOFLURANE IN SOLN
RESPIRATORY_TRACT | Status: AC
  Filled 2019-02-15: qty 250

## 2019-02-15 MED ORDER — SODIUM CHLORIDE 0.9 % IR SOLN
Status: DC | PRN
  Administered 2019-02-15 (×7): 3000 mL

## 2019-02-15 MED ORDER — EPHEDRINE SULFATE 50 MG/ML IJ SOLN
INTRAMUSCULAR | Status: DC | PRN
  Administered 2019-02-15 (×2): 12.5 mg via INTRAVENOUS
  Administered 2019-02-15: 10 mg via INTRAVENOUS

## 2019-02-15 MED ORDER — ONDANSETRON HCL 4 MG/2ML IJ SOLN
INTRAMUSCULAR | Status: DC | PRN
  Administered 2019-02-15: 4 mg via INTRAVENOUS

## 2019-02-15 MED ORDER — PREGABALIN 50 MG PO CAPS
ORAL_CAPSULE | ORAL | Status: AC
  Filled 2019-02-15: qty 1

## 2019-02-15 MED ORDER — HYDROMORPHONE HCL 1 MG/ML IJ SOLN
0.2500 mg | INTRAMUSCULAR | Status: DC | PRN
  Administered 2019-02-15 (×5): 0.5 mg via INTRAVENOUS
  Filled 2019-02-15 (×3): qty 0.5

## 2019-02-15 MED ORDER — BUPIVACAINE LIPOSOME 1.3 % IJ SUSP
INTRAMUSCULAR | Status: DC | PRN
  Administered 2019-02-15: 20 mL

## 2019-02-15 MED ORDER — METHOCARBAMOL 1000 MG/10ML IJ SOLN
500.0000 mg | Freq: Once | INTRAVENOUS | Status: AC
  Administered 2019-02-15: 500 mg via INTRAVENOUS
  Filled 2019-02-15: qty 5

## 2019-02-15 MED ORDER — SUCCINYLCHOLINE 20MG/ML (10ML) SYRINGE FOR MEDFUSION PUMP - OPTIME
INTRAMUSCULAR | Status: DC | PRN
  Administered 2019-02-15: 140 mg via INTRAVENOUS

## 2019-02-15 MED ORDER — ONDANSETRON HCL 4 MG/2ML IJ SOLN
INTRAMUSCULAR | Status: AC
  Filled 2019-02-15: qty 2

## 2019-02-15 MED ORDER — LACTATED RINGERS IV SOLN
INTRAVENOUS | Status: DC
  Administered 2019-02-15 (×3): via INTRAVENOUS

## 2019-02-15 MED ORDER — OXYCODONE HCL 5 MG PO TABS
5.0000 mg | ORAL_TABLET | Freq: Once | ORAL | Status: AC
  Administered 2019-02-15: 5 mg via ORAL

## 2019-02-15 MED ORDER — PROPOFOL 10 MG/ML IV BOLUS
INTRAVENOUS | Status: DC | PRN
  Administered 2019-02-15: 200 mg via INTRAVENOUS

## 2019-02-15 MED ORDER — ROCURONIUM BROMIDE 10 MG/ML (PF) SYRINGE
PREFILLED_SYRINGE | INTRAVENOUS | Status: AC
  Filled 2019-02-15: qty 10

## 2019-02-15 MED ORDER — PROMETHAZINE HCL 12.5 MG PO TABS: 12.5000 mg | ORAL_TABLET | Freq: Four times a day (QID) | ORAL | 0 refills | Status: DC | PRN

## 2019-02-15 MED ORDER — FENTANYL CITRATE (PF) 250 MCG/5ML IJ SOLN
INTRAMUSCULAR | Status: AC
  Filled 2019-02-15: qty 5

## 2019-02-15 MED ORDER — HYDROCORTISONE NA SUCCINATE PF 100 MG IJ SOLR
INTRAMUSCULAR | Status: AC
  Filled 2019-02-15: qty 2

## 2019-02-15 MED ORDER — PREGABALIN 50 MG PO CAPS
50.0000 mg | ORAL_CAPSULE | Freq: Once | ORAL | Status: AC
  Administered 2019-02-15: 50 mg via ORAL

## 2019-02-15 MED ORDER — BUPIVACAINE LIPOSOME 1.3 % IJ SUSP
INTRAMUSCULAR | Status: AC
  Filled 2019-02-15: qty 20

## 2019-02-15 MED ORDER — GLYCOPYRROLATE PF 0.2 MG/ML IJ SOSY
PREFILLED_SYRINGE | INTRAMUSCULAR | Status: AC
  Filled 2019-02-15: qty 1

## 2019-02-15 MED ORDER — PHENYLEPHRINE 40 MCG/ML (10ML) SYRINGE FOR IV PUSH (FOR BLOOD PRESSURE SUPPORT)
PREFILLED_SYRINGE | INTRAVENOUS | Status: AC
  Filled 2019-02-15: qty 10

## 2019-02-15 MED ORDER — PROPOFOL 10 MG/ML IV BOLUS
INTRAVENOUS | Status: AC
  Filled 2019-02-15: qty 40

## 2019-02-15 MED ORDER — DEXTROSE 5 % IV SOLN
3.0000 g | INTRAVENOUS | Status: AC
  Administered 2019-02-15: 3 g via INTRAVENOUS

## 2019-02-15 MED ORDER — PHENYLEPHRINE HCL (PRESSORS) 10 MG/ML IV SOLN
INTRAVENOUS | Status: DC | PRN
  Administered 2019-02-15 (×5): 80 ug via INTRAVENOUS

## 2019-02-15 MED ORDER — 0.9 % SODIUM CHLORIDE (POUR BTL) OPTIME
TOPICAL | Status: DC | PRN
  Administered 2019-02-15: 1000 mL

## 2019-02-15 MED ORDER — CELECOXIB 400 MG PO CAPS
400.0000 mg | ORAL_CAPSULE | Freq: Once | ORAL | Status: AC
  Administered 2019-02-15: 400 mg via ORAL

## 2019-02-15 MED ORDER — PROMETHAZINE HCL 25 MG/ML IJ SOLN: 6.2500 mg | INTRAMUSCULAR | Status: DC | PRN

## 2019-02-15 MED ORDER — FENTANYL CITRATE (PF) 100 MCG/2ML IJ SOLN
INTRAMUSCULAR | Status: DC | PRN
  Administered 2019-02-15: 75 ug via INTRAVENOUS
  Administered 2019-02-15 (×3): 50 ug via INTRAVENOUS
  Administered 2019-02-15: 100 ug via INTRAVENOUS
  Administered 2019-02-15: 25 ug via INTRAVENOUS

## 2019-02-15 MED ORDER — CELECOXIB 400 MG PO CAPS
ORAL_CAPSULE | ORAL | Status: AC
  Filled 2019-02-15: qty 1

## 2019-02-15 MED ORDER — DEXAMETHASONE SODIUM PHOSPHATE 10 MG/ML IJ SOLN
INTRAMUSCULAR | Status: AC
  Filled 2019-02-15: qty 1

## 2019-02-15 MED ORDER — ONDANSETRON HCL 4 MG/2ML IJ SOLN
4.0000 mg | Freq: Once | INTRAMUSCULAR | Status: AC
  Administered 2019-02-15: 4 mg via INTRAVENOUS

## 2019-02-15 SURGICAL SUPPLY — 68 items
ANCHOR SUT BIO SW 4.75X19.1 (Anchor) ×2 IMPLANT
BENZOIN TINCTURE PRP APPL 2/3 (GAUZE/BANDAGES/DRESSINGS) ×3 IMPLANT
BLADE 10 SAFETY STRL DISP (BLADE) ×6 IMPLANT
BLADE 11 SAFETY STRL DISP (BLADE) ×3 IMPLANT
BLADE 15 SAFETY STRL DISP (BLADE) ×3 IMPLANT
BLADE HEX COATED 2.75 (ELECTRODE) ×3 IMPLANT
BNDG COHESIVE 4X5 TAN STRL (GAUZE/BANDAGES/DRESSINGS) ×3 IMPLANT
CANNULA DRILOCK 6.5X75 (CANNULA) ×1 IMPLANT
CHLORAPREP W/TINT 26 (MISCELLANEOUS) ×3 IMPLANT
CLOTH BEACON ORANGE TIMEOUT ST (SAFETY) ×3 IMPLANT
COVER LIGHT HANDLE STERIS (MISCELLANEOUS) ×10 IMPLANT
COVER WAND RF STERILE (DRAPES) ×1 IMPLANT
DECANTER SPIKE VIAL GLASS SM (MISCELLANEOUS) ×5 IMPLANT
DRAPE PROXIMA HALF (DRAPES) ×3 IMPLANT
DRAPE SHOULDER BEACH CHAIR (DRAPES) ×3 IMPLANT
DRAPE U-SHAPE 47X51 STRL (DRAPES) ×3 IMPLANT
DRESSING MEPILEX BORDER 6X8 (GAUZE/BANDAGES/DRESSINGS) ×2 IMPLANT
DRSG MEPILEX BORDER 4X8 (GAUZE/BANDAGES/DRESSINGS) ×3 IMPLANT
DRSG MEPILEX BORDER 6X8 (GAUZE/BANDAGES/DRESSINGS) ×6
ELECT REM PT RETURN 9FT ADLT (ELECTROSURGICAL) ×3
ELECTRODE REM PT RTRN 9FT ADLT (ELECTROSURGICAL) ×2 IMPLANT
GAUZE 4X4 16PLY RFD (DISPOSABLE) ×3 IMPLANT
GLOVE BIO SURGEON STRL SZ7 (GLOVE) ×2 IMPLANT
GLOVE BIOGEL PI IND STRL 7.0 (GLOVE) ×2 IMPLANT
GLOVE BIOGEL PI INDICATOR 7.0 (GLOVE) ×3
GLOVE SKINSENSE NS SZ8.0 LF (GLOVE) ×1
GLOVE SKINSENSE STRL SZ8.0 LF (GLOVE) ×2 IMPLANT
GLOVE SS N UNI LF 8.5 STRL (GLOVE) ×3 IMPLANT
GOWN STRL REUS W/TWL LRG LVL3 (GOWN DISPOSABLE) ×10 IMPLANT
GOWN STRL REUS W/TWL XL LVL3 (GOWN DISPOSABLE) ×3 IMPLANT
INST SET MINOR BONE (KITS) ×3 IMPLANT
IV NS IRRIG 3000ML ARTHROMATIC (IV SOLUTION) ×11 IMPLANT
KIT BLADEGUARD II DBL (SET/KITS/TRAYS/PACK) ×3 IMPLANT
KIT POSITION SHOULDER SCHLEI (MISCELLANEOUS) ×3 IMPLANT
KIT TURNOVER KIT A (KITS) ×3 IMPLANT
MANIFOLD NEPTUNE II (INSTRUMENTS) ×3 IMPLANT
MARKER SKIN DUAL TIP RULER LAB (MISCELLANEOUS) ×3 IMPLANT
NDL HYPO 21X1.5 SAFETY (NEEDLE) ×2 IMPLANT
NDL MAYO 6 CRC TAPER PT (NEEDLE) IMPLANT
NDL SCORPION MULTI FIRE (NEEDLE) IMPLANT
NDL SPNL 18GX3.5 QUINCKE PK (NEEDLE) ×2 IMPLANT
NEEDLE HYPO 21X1.5 SAFETY (NEEDLE) ×3 IMPLANT
NEEDLE MAYO 6 CRC TAPER PT (NEEDLE) ×3 IMPLANT
NEEDLE SCORPION MULTI FIRE (NEEDLE) ×3 IMPLANT
NEEDLE SPNL 18GX3.5 QUINCKE PK (NEEDLE) ×3 IMPLANT
NS IRRIG 1000ML POUR BTL (IV SOLUTION) ×3 IMPLANT
PACK TOTAL JOINT (CUSTOM PROCEDURE TRAY) ×1 IMPLANT
PAD ARMBOARD 7.5X6 YLW CONV (MISCELLANEOUS) ×3 IMPLANT
PENCIL HANDSWITCHING (ELECTRODE) ×3 IMPLANT
SET ARTHROSCOPY INST (INSTRUMENTS) ×3 IMPLANT
SET BASIN LINEN APH (SET/KITS/TRAYS/PACK) ×3 IMPLANT
SET TUBE SUCT SHAVER OUTFL 24K (TUBING) ×5 IMPLANT
SLING ARM FOAM STRAP XLG (SOFTGOODS) ×1 IMPLANT
SPONGE GAUZE 2X2 8PLY STRL LF (GAUZE/BANDAGES/DRESSINGS) ×3 IMPLANT
STOCKINETTE IMPERVIOUS LG (DRAPES) ×3 IMPLANT
STRIP CLOSURE SKIN 1/2X4 (GAUZE/BANDAGES/DRESSINGS) ×2 IMPLANT
SUT ETHILON 3 0 FSL (SUTURE) ×1 IMPLANT
SUT FIBERWIRE #2 38 REV NDL BL (SUTURE)
SUT MON AB 0 CT1 (SUTURE) ×3 IMPLANT
SUT MON AB 2-0 CT1 36 (SUTURE) ×1 IMPLANT
SUT PROLENE 3 0 PS 1 (SUTURE) IMPLANT
SUTURE FIBERWR#2 38 REV NDL BL (SUTURE) IMPLANT
SYR 30ML LL (SYRINGE) ×3 IMPLANT
SYR BULB IRRIGATION 50ML (SYRINGE) ×5 IMPLANT
TAPE FIBER 2MM 7IN #2 BLUE (SUTURE) ×3 IMPLANT
TOWEL OR 17X26 4PK STRL BLUE (TOWEL DISPOSABLE) ×3 IMPLANT
YANKAUER SUCT 12FT TUBE ARGYLE (SUCTIONS) ×3 IMPLANT
YANKAUER SUCT BULB TIP 10FT TU (MISCELLANEOUS) ×6 IMPLANT

## 2019-02-15 NOTE — Transfer of Care (Signed)
Immediate Anesthesia Transfer of Care Note  Patient: Willie Howe  Procedure(s) Performed: SHOULDER ARTHROSCOPY WITH open ROTATOR CUFF REPAIR AND OPEN BICEPS TENODESIS (Left Shoulder)  Patient Location: PACU  Anesthesia Type:General  Level of Consciousness: awake and alert   Airway & Oxygen Therapy: Patient Spontanous Breathing  Post-op Assessment: Report given to RN  Post vital signs: Reviewed and stable  Last Vitals:  Vitals Value Taken Time  BP 136/66 02/15/19 1057  Temp    Pulse 97 02/15/19 1059  Resp 21 02/15/19 1059  SpO2 92 % 02/15/19 1059  Vitals shown include unvalidated device data.  Last Pain:  Vitals:   02/15/19 0709  TempSrc: Oral  PainSc: 8       Patients Stated Pain Goal: 7 (84/06/98 6148)  Complications: No apparent anesthesia complications

## 2019-02-15 NOTE — Brief Op Note (Signed)
02/15/2019  10:48 AM  PATIENT:  Willie Howe  58 y.o. male  PRE-OPERATIVE DIAGNOSIS:  left rotator cuff tear acromioclavicular arthritis  POST-OPERATIVE DIAGNOSIS:  left rotator cuff tear acromioclavicular arthritis  PROCEDURE:  Procedure(s): SHOULDER ARTHROSCOPY WITH open ROTATOR CUFF REPAIR AND OPEN BICEPS TENOTOMY (Left)  SWIVEL LOCK ANCHORS ARTHREX X 2   FINDINGS: large rotator cuff tear retracted to past midpoint of humeral head. Supraspinatus and partial Infraspinatus; with extra-articular tear of the biceps tendon  Normal glenohumeral joint  Extensive subacromial bursitis  Medial overgrowth acromion anterior edge  Small degenerative superior edge subscapularis tear                    SURGEON:  Surgeon(s) and Role:    Carole Civil, MD - Primary  Surgery done as follows: Patient was seen in preop left shoulder confirmed the surgical site.  Patient taken to surgery general anesthesia beachchair position sterile prep and drape.  Preop range of motion was normal  Timeout was completed after sterile prep and drape.   The scope was placed in the posterior portal diagnostic arthroscopy was performed the findings are noted above.  An anterior portal was established superior to the subscapularis a shaver was placed in the joint the biceps tendon anchor was normal however when the biceps was pulled into the joint and approximately 2 cm of the tendon that was in the joint was torn and degenerated.  Once this was debrided there was less than 50% tendon left and this was converted to a tenotomy.  The cuff tear was evaluated arthroscopically from the joint was a large tear to involve the supraspinatus and a portion of the infraspinatus tendon  Gleno humeral joint was normal  The scope was placed into the subacromial space portal was established anterolaterally and a bursectomy was performed.  The distal clavicle was not prominent in the subacromial space although the medial  edge of the anterior edge of the acromion was this was removed with a bur.  After bursectomy was completed the tear was identified from the 50 yard line portal view a second accessory lateral portal was established posteriorly and the greater tuberosity was debrided of soft tissue and down to bleeding bone bed.  Care was assessed for mobility.  The tear had good mobility.  This was an L-shaped tear.  A mini incision was made.  2 fiber tapes which were passed arthroscopically were used to bring the posterior leaf back to the anterior leaf and anchored down with a swivel lock and then a second fiber tape was repositioned anteriorly and brought posteriorly with a second swivel lock and then a fiber wire suture was used to close the defect in the L-shaped tear once the 2 edges were reapproximated  Wound was irrigated and closed with 0 Monocryl in 2 layers 2-0 Monocryl Steri-Strips and benzoin  3-0 nylon was placed in the portals  Sterile dressing was applied patient was extubated patient in a sling taken to recovery room in stable condition PHYSICIAN ASSISTANT:   ASSISTANTS: none   ANESTHESIA:   GENERAL EBL:  none   BLOOD ADMINISTERED:none  DRAINS: none   LOCAL MEDICATIONS USED:  MARCAINE 30 CC WITH EPI AND 20 CC EXPAREL  SPECIMEN:  No Specimen  DISPOSITION OF SPECIMEN:  N/A  COUNTS:  YES  DICTATION: .Dragon Dictation  PLAN OF CARE: Discharge to home after PACU  PATIENT DISPOSITION:  PACU - hemodynamically stable.   Delay start of Pharmacological VTE  agent (>24hrs) due to surgical blood loss or risk of bleeding: not applicable  TOURNIQUET:  * No tourniquets in log *  DICTATION: .Dragon Dictation  PLAN OF CARE: Discharge to home after PACU  PATIENT DISPOSITION:  PACU - hemodynamically stable.   Delay start of Pharmacological VTE agent (>24hrs) due to surgical blood loss or risk of bleeding: not applicable

## 2019-02-15 NOTE — Addendum Note (Signed)
Addendum  created 02/15/19 1320 by Mickel Baas, CRNA   Attestation recorded in [place], [place] filed

## 2019-02-15 NOTE — Discharge Instructions (Signed)
Surgery for Rotator Cuff Tear, Care After This sheet gives you information about how to care for yourself after your procedure. Your health care provider may also give you more specific instructions. If you have problems or questions, contact your health care provider. What can I expect after the procedure? After the procedure, it is common to have:  Swelling.  Pain.  Stiffness.  Tenderness. Follow these instructions at home: If you have a sling or a shoulder immobilizer:  Wear it as told by your health care provider. Remove it only as told by your health care provider.  Loosen it if your fingers tingle, become numb, or turn cold and blue.  Keep it clean. Bathing  Do not take baths, swim, or use a hot tub until your health care provider approves. Ask your health care provider if you may take showers. You may only be allowed to take sponge baths.  Keep your bandage (dressing) dry until your health care provider says it can be removed.  If your sling or shoulder immobilizer is not waterproof: ? Do not let it get wet. ? Remove it when you take a bath or shower as told by your health care provider. Once the sling or shoulder immobilizer is removed, try not to move your shoulder until your health care provider says that you can. Incision care   Follow instructions from your health care provider about how to take care of your incision. Make sure you: ? Wash your hands with soap and water before and after you change your dressing. If soap and water are not available, use hand sanitizer. ? Change your dressing as told by your health care provider. ? Leave stitches (sutures), skin glue, or adhesive strips in place. These skin closures may need to stay in place for 2 weeks or longer. If adhesive strip edges start to loosen and curl up, you may trim the loose edges. Do not remove adhesive strips completely unless your health care provider tells you to do that.  Check your incision area  every day for signs of infection. Check for: ? More redness, swelling, or pain. ? More fluid or blood. ? Warmth. ? Pus or a bad smell. Managing pain, stiffness, and swelling   If directed, put ice on your shoulder area. ? Put ice in a plastic bag. ? Place a towel between your skin and the bag. ? Leave the ice on for 20 minutes, 2-3 times a day.  Move your fingers often to reduce stiffness and swelling.  Raise (elevate) your upper body on pillows when you lie down and when you sleep. ? Do not sleep on the front of your body (abdomen). ? Do not sleep on the side that your surgery was performed on. Medicines  Take over-the-counter and prescription medicines only as told by your health care provider.  Ask your health care provider if the medicine prescribed to you: ? Requires you to avoid driving or using heavy machinery. ? Can cause constipation. You may need to take actions to prevent or treat constipation, such as:  Drink enough fluid to keep your urine pale yellow.  Take over-the-counter or prescription medicines.  Eat foods that are high in fiber, such as beans, whole grains, and fresh fruits and vegetables.  Limit foods that are high in fat and processed sugars, such as fried or sweet foods. Driving  Do not drive for 24 hours if you were given a sedative during your procedure.  Do not drive while wearing a sling  or a shoulder immobilizer. Ask your health care provider when it is safe to drive. Activity  Do not use your arm to support your body weight until your health care provider says that you can.  Do not lift or hold anything with your arm until your health care provider approves.  Return to your normal activities as told by your health care provider. Ask your health care provider what activities are safe for you.  Do exercises as told by your health care provider. General instructions  Do not use any products that contain nicotine or tobacco, such as  cigarettes, e-cigarettes, and chewing tobacco. These can delay healing after surgery. If you need help quitting, ask your health care provider.  Keep all follow-up visits as told by your health care provider. This is important. Contact a health care provider if:  You have a fever.  You have more redness, swelling, or pain around your incision.  You have more fluid or blood coming from your incision.  Your incision feels warm to the touch.  You have pus or a bad smell coming from your incision.  You have pain that gets worse or does not get better with medicine. Get help right away if:  You have severe pain.  You lose feeling in your arm or hand.  Your hand or fingers turn very pale or blue. Summary  If you have a sling, wear it as told by your health care provider. Remove it only as told by your health care provider.  Change your dressing as told by your health care provider. Check the incision area every day for signs of infection.  If directed, put ice on your shoulder area 2-3 times a day.  Do not use your arm to lift anything or to support your body weight until your health care provider says that you can. This information is not intended to replace advice given to you by your health care provider. Make sure you discuss any questions you have with your health care provider. Document Released: 08/04/2005 Document Revised: 05/10/2018 Document Reviewed: 05/12/2018 Elsevier Patient Education  2020 [place] Anesthesia, Adult, Care After This sheet gives you information about how to care for yourself after your procedure. Your health care provider may also give you more specific instructions. If you have problems or questions, contact your health care provider. What can I expect after the procedure? After the procedure, the following side effects are common:  Pain or discomfort at the IV site.  Nausea.  Vomiting.  Sore throat.  Trouble  concentrating.  Feeling cold or chills.  Weak or tired.  Sleepiness and fatigue.  Soreness and body aches. These side effects can affect parts of the body that were not involved in surgery. Follow these instructions at home:  For at least 24 hours after the procedure:  Have a responsible adult stay with you. It is important to have someone help care for you until you are awake and alert.  Rest as needed.  Do not: ? Participate in activities in which you could fall or become injured. ? Drive. ? Use heavy machinery. ? Drink alcohol. ? Take sleeping pills or medicines that cause drowsiness. ? Make important decisions or sign legal documents. ? Take care of children on your own. Eating and drinking  Follow any instructions from your health care provider about eating or drinking restrictions.  When you feel hungry, start by eating small amounts of foods that are soft and easy to digest (bland),  such as toast. Gradually return to your regular diet.  Drink enough fluid to keep your urine pale yellow.  If you vomit, rehydrate by drinking water, juice, or clear broth. General instructions  If you have sleep apnea, surgery and certain medicines can increase your risk for breathing problems. Follow instructions from your health care provider about wearing your sleep device: ? Anytime you are sleeping, including during daytime naps. ? While taking prescription pain medicines, sleeping medicines, or medicines that make you drowsy.  Return to your normal activities as told by your health care provider. Ask your health care provider what activities are safe for you.  Take over-the-counter and prescription medicines only as told by your health care provider.  If you smoke, do not smoke without supervision.  Keep all follow-up visits as told by your health care provider. This is important. Contact a health care provider if:  You have nausea or vomiting that does not get better with  medicine.  You cannot eat or drink without vomiting.  You have pain that does not get better with medicine.  You are unable to pass urine.  You develop a skin rash.  You have a fever.  You have redness around your IV site that gets worse. Get help right away if:  You have difficulty breathing.  You have chest pain.  You have blood in your urine or stool, or you vomit blood. Summary  After the procedure, it is common to have a sore throat or nausea. It is also common to feel tired.  Have a responsible adult stay with you for the first 24 hours after general anesthesia. It is important to have someone help care for you until you are awake and alert.  When you feel hungry, start by eating small amounts of foods that are soft and easy to digest (bland), such as toast. Gradually return to your regular diet.  Drink enough fluid to keep your urine pale yellow.  Return to your normal activities as told by your health care provider. Ask your health care provider what activities are safe for you. This information is not intended to replace advice given to you by your health care provider. Make sure you discuss any questions you have with your health care provider. Document Released: 11/10/2000 Document Revised: 08/07/2017 Document Reviewed: 03/20/2017 Elsevier Patient Education  2020 [employer].

## 2019-02-15 NOTE — Anesthesia Postprocedure Evaluation (Signed)
Anesthesia Post Note  Patient: Willie Howe  Procedure(s) Performed: SHOULDER ARTHROSCOPY WITH open ROTATOR CUFF REPAIR AND OPEN BICEPS TENODESIS (Left Shoulder)  Patient location during evaluation: PACU Anesthesia Type: General Level of consciousness: awake and alert and oriented Pain management: pain level controlled Vital Signs Assessment: post-procedure vital signs reviewed and stable Respiratory status: spontaneous breathing Cardiovascular status: blood pressure returned to baseline and stable Postop Assessment: no apparent nausea or vomiting Anesthetic complications: no     Last Vitals:  Vitals:   02/15/19 1215 02/15/19 1230  BP: 136/86 135/77  Pulse: 98 (!) 101  Resp: 13 18  Temp:  36.7 C  SpO2: 100% 94%    Last Pain:  Vitals:   02/15/19 1230  TempSrc: Oral  PainSc: 7                  Adir Schicker

## 2019-02-15 NOTE — Op Note (Signed)
02/15/2019  10:48 AM  PATIENT:  Willie Howe  58 y.o. male  PRE-OPERATIVE DIAGNOSIS:  left rotator cuff tear acromioclavicular arthritis  POST-OPERATIVE DIAGNOSIS:  left rotator cuff tear acromioclavicular arthritis  PROCEDURE:  Procedure(s): SHOULDER ARTHROSCOPY WITH open ROTATOR CUFF REPAIR AND OPEN BICEPS TENOTOMY (Left)  SWIVEL LOCK ANCHORS ARTHREX X 2   FINDINGS: large rotator cuff tear retracted to past midpoint of humeral head. Supraspinatus and partial Infraspinatus; with extra-articular tear of the biceps tendon  Normal glenohumeral joint  Extensive subacromial bursitis  Medial overgrowth acromion anterior edge  Small degenerative superior edge subscapularis tear                    SURGEON:  Surgeon(s) and Role:    Carole Civil, MD - Primary  Surgery done as follows: Patient was seen in preop left shoulder confirmed the surgical site.  Patient taken to surgery general anesthesia beachchair position sterile prep and drape.  Preop range of motion was normal  Timeout was completed after sterile prep and drape.   The scope was placed in the posterior portal diagnostic arthroscopy was performed the findings are noted above.  An anterior portal was established superior to the subscapularis a shaver was placed in the joint the biceps tendon anchor was normal however when the biceps was pulled into the joint and approximately 2 cm of the tendon that was in the joint was torn and degenerated.  Once this was debrided there was less than 50% tendon left and this was converted to a tenotomy.  The cuff tear was evaluated arthroscopically from the joint was a large tear to involve the supraspinatus and a portion of the infraspinatus tendon  Gleno humeral joint was normal  The scope was placed into the subacromial space portal was established anterolaterally and a bursectomy was performed.  The distal clavicle was not prominent in the subacromial space although the medial  edge of the anterior edge of the acromion was this was removed with a bur.  After bursectomy was completed the tear was identified from the 50 yard line portal view a second accessory lateral portal was established posteriorly and the greater tuberosity was debrided of soft tissue and down to bleeding bone bed.  Care was assessed for mobility.  The tear had good mobility.  This was an L-shaped tear.  A mini incision was made.  2 fiber tapes which were passed arthroscopically were used to bring the posterior leaf back to the anterior leaf and anchored down with a swivel lock and then a second fiber tape was repositioned anteriorly and brought posteriorly with a second swivel lock and then a fiber wire suture was used to close the defect in the L-shaped tear once the 2 edges were reapproximated  Wound was irrigated and closed with 0 Monocryl in 2 layers 2-0 Monocryl Steri-Strips and benzoin  3-0 nylon was placed in the portals  Sterile dressing was applied patient was extubated patient in a sling taken to recovery room in stable condition PHYSICIAN ASSISTANT:   ASSISTANTS: none   ANESTHESIA:   GENERAL EBL:  none   BLOOD ADMINISTERED:none  DRAINS: none   LOCAL MEDICATIONS USED:  MARCAINE 30 CC WITH EPI AND 20 CC EXPAREL  SPECIMEN:  No Specimen  DISPOSITION OF SPECIMEN:  N/A  COUNTS:  YES  DICTATION: .Dragon Dictation  PLAN OF CARE: Discharge to home after PACU  PATIENT DISPOSITION:  PACU - hemodynamically stable.   Delay start of Pharmacological VTE  agent (>24hrs) due to surgical blood loss or risk of bleeding: not applicable  TOURNIQUET:  * No tourniquets in log *  DICTATION: .Dragon Dictation  PLAN OF CARE: Discharge to home after PACU  PATIENT DISPOSITION:  PACU - hemodynamically stable.   Delay start of Pharmacological VTE agent (>24hrs) due to surgical blood loss or risk of bleeding: not applicable

## 2019-02-15 NOTE — Anesthesia Preprocedure Evaluation (Signed)
Anesthesia Evaluation  Patient identified by MRN, date of birth, ID band Patient awake    Reviewed: Allergy & Precautions, NPO status , Patient's Chart, lab work & pertinent test results  Airway Mallampati: II  TM Distance: >3 FB Neck ROM: Full    Dental no notable dental hx. (+) Edentulous Upper   Pulmonary sleep apnea , former smoker,  Reports [place] with CPAP Denies O2 use at home    Pulmonary exam normal breath sounds clear to auscultation       Cardiovascular Exercise Tolerance: Good Normal cardiovascular exam(-) dysrhythmias I Rhythm:Regular Rate:Normal  Pt denies any cardiac issues -reports good ET - 2017 ECG showed RBBB,LVH   Neuro/Psych  Headaches, Pt with RA on IV q6 weeks andPO MTX/ deltasone 10 q d  negative psych ROS   GI/Hepatic negative GI ROS, Neg liver ROS,   Endo/Other  diabetes, Well Controlled, Type 2Hyperthyroidism States had graves Dz -treated ~2000  Renal/GU negative Renal ROS  negative genitourinary   Musculoskeletal negative musculoskeletal ROS (+)   Abdominal   Peds negative pediatric ROS (+)  Hematology negative hematology ROS (+)   Anesthesia Other Findings   Reproductive/Obstetrics negative OB ROS                             Anesthesia Physical Anesthesia Plan  ASA: III  Anesthesia Plan: General   Post-op Pain Management:    Induction: Intravenous  PONV Risk Score and Plan: 2 and Ondansetron, Treatment may vary due to age or medical condition and Dexamethasone  Airway Management Planned: Oral ETT  Additional Equipment:   Intra-op Plan:   Post-operative Plan: Extubation in OR  Informed Consent:     Dental advisory given  Plan Discussed with: CRNA  Anesthesia Plan Comments: (Plan Full PPE use  Plan GETA -D/w pt poss ISB prior to OR -WTP with GETA only -states if miserable and awake -may consider doing ISB post op , if requested )         Anesthesia Quick Evaluation

## 2019-02-15 NOTE — Interval H&P Note (Signed)
History and Physical Interval Note:  02/15/2019 7:25 AM  Willie Howe  has presented today for surgery, with the diagnosis of left rotator cuff tear acromioclavicular arthritis.  The various methods of treatment have been discussed with the patient and family. After consideration of risks, benefits and other options for treatment, the patient has consented to  Procedure(s): ARTHROSCOPIC ROTATOR CUFF REPAIR AND DISTAL CLAVICLE EXCISION (Left) as a surgical intervention.  The patient's history has been reviewed, patient examined, no change in status, stable for surgery.  I have reviewed the patient's chart and labs.  Questions were answered to the patient's satisfaction.     Arther Abbott

## 2019-02-15 NOTE — Anesthesia Procedure Notes (Addendum)
Procedure Name: Intubation Date/Time: 02/15/2019 7:47 AM Performed by: Ollen Bowl, CRNA Pre-anesthesia Checklist: Patient identified, Patient being monitored, Timeout performed, Emergency Drugs available and Suction available Patient Re-evaluated:Patient Re-evaluated prior to induction Oxygen Delivery Method: Circle system utilized Preoxygenation: Pre-oxygenation with 100% oxygen Induction Type: IV induction Ventilation: Mask ventilation without difficulty Laryngoscope Size: Mac and 3 Grade View: Grade I Tube type: Oral Tube size: 7.0 mm Number of attempts: 1 Airway Equipment and Method: Stylet Placement Confirmation: ETT inserted through vocal cords under direct vision,  positive ETCO2 and breath sounds checked- equal and bilateral Secured at: 23 cm Tube secured with: Tape Dental Injury: Teeth and Oropharynx as per pre-operative assessment

## 2019-02-15 NOTE — Anesthesia Procedure Notes (Deleted)
Performed by: Ollen Bowl, CRNA

## 2019-02-15 NOTE — Progress Notes (Signed)
Attempted to call wife Myra at home and mobile number with no answer.

## 2019-02-16 ENCOUNTER — Encounter (HOSPITAL_COMMUNITY): Payer: Self-pay | Admitting: Orthopedic Surgery

## 2019-02-21 DIAGNOSIS — E119 Type 2 diabetes mellitus without complications: Secondary | ICD-10-CM | POA: Diagnosis not present

## 2019-02-21 DIAGNOSIS — E782 Mixed hyperlipidemia: Secondary | ICD-10-CM | POA: Diagnosis not present

## 2019-02-21 DIAGNOSIS — R3912 Poor urinary stream: Secondary | ICD-10-CM | POA: Diagnosis not present

## 2019-02-21 DIAGNOSIS — N401 Enlarged prostate with lower urinary tract symptoms: Secondary | ICD-10-CM | POA: Diagnosis not present

## 2019-02-21 DIAGNOSIS — I10 Essential (primary) hypertension: Secondary | ICD-10-CM | POA: Diagnosis not present

## 2019-02-24 ENCOUNTER — Other Ambulatory Visit: Payer: Self-pay | Admitting: Orthopedic Surgery

## 2019-02-24 NOTE — Telephone Encounter (Signed)
Patient requests refill on Percocet 5-325  Mgs.   Qty  42  Sig: Take 1 tablet by mouth every 4 (four) hours as needed for up to 7 days for severe pain.  Patient states he uses [facility] in [place]

## 2019-02-25 MED ORDER — OXYCODONE-ACETAMINOPHEN 5-325 MG PO TABS: 1.0000 | ORAL_TABLET | Freq: Four times a day (QID) | ORAL | 0 refills | Status: DC | PRN

## 2019-02-28 DIAGNOSIS — Z79899 Other long term (current) drug therapy: Secondary | ICD-10-CM | POA: Diagnosis not present

## 2019-02-28 DIAGNOSIS — M0589 Other rheumatoid arthritis with rheumatoid factor of multiple sites: Secondary | ICD-10-CM | POA: Diagnosis not present

## 2019-03-01 DIAGNOSIS — Z9889 Other specified postprocedural states: Secondary | ICD-10-CM | POA: Insufficient documentation

## 2019-03-02 ENCOUNTER — Encounter: Payer: Self-pay | Admitting: Orthopedic Surgery

## 2019-03-02 ENCOUNTER — Ambulatory Visit (INDEPENDENT_AMBULATORY_CARE_PROVIDER_SITE_OTHER): Payer: Medicare HMO | Admitting: Orthopedic Surgery

## 2019-03-02 ENCOUNTER — Other Ambulatory Visit: Payer: Self-pay

## 2019-03-02 DIAGNOSIS — Z9889 Other specified postprocedural states: Secondary | ICD-10-CM

## 2019-03-02 NOTE — Progress Notes (Signed)
Chief Complaint  Patient presents with  . Routine Post Op    02/15/19 left RCR    [name] is doing well his wound looks good  Percocet ibuprofen and Robaxin current medications  Recommend physical therapy start in [place]  Follow-up in 4 weeks  No orders of the defined types were placed in this encounter.   Current Outpatient Medications:  .  aspirin EC 81 MG tablet, Take 81 mg by mouth daily., Disp: , Rfl:  .  docusate sodium (COLACE) 100 MG capsule, Take 100 mg by mouth every evening., Disp: , Rfl:  .  folic acid (FOLVITE) 1 MG tablet, Take 1 mg by mouth daily., Disp: , Rfl:  .  ibuprofen (ADVIL) 800 MG tablet, Take 1 tablet (800 mg total) by mouth every 8 (eight) hours as needed. (Patient taking differently: Take 800 mg by mouth every 8 (eight) hours as needed for moderate pain. ), Disp: 90 tablet, Rfl: 5 .  ibuprofen (ADVIL) 800 MG tablet, Take 1 tablet (800 mg total) by mouth every 8 (eight) hours as needed., Disp: 90 tablet, Rfl: 1 .  inFLIXimab-abda (RENFLEXIS IV), Inject 1 Dose into the vein every 6 (six) weeks., Disp: , Rfl:  .  loratadine (CLARITIN) 10 MG tablet, Take 10 mg by mouth 2 (two) times a day. , Disp: , Rfl:  .  Melatonin 10 MG CAPS, Take 10 mg by mouth at bedtime as needed (sleep)., Disp: , Rfl:  .  metFORMIN (GLUCOPHAGE-XR) 500 MG 24 hr tablet, Take 500 mg by mouth 2 (two) times a day., Disp: , Rfl:  .  methocarbamol (ROBAXIN) 500 MG tablet, Take 1 tablet (500 mg total) by mouth 4 (four) times daily., Disp: 60 tablet, Rfl: 2 .  methotrexate 50 MG/2ML injection, Inject 20 mg into the muscle every Saturday., Disp: , Rfl:  .  oxyCODONE-acetaminophen (PERCOCET/ROXICET) 5-325 MG tablet, Take 1 tablet by mouth every 6 (six) hours as needed for severe pain., Disp: 42 tablet, Rfl: 0 .  predniSONE (DELTASONE) 5 MG tablet, Take 5 mg by mouth daily., Disp: , Rfl:  .  promethazine (PHENERGAN) 12.5 MG tablet, Take 1 tablet (12.5 mg total) by mouth every 6 (six) hours as needed  for nausea or vomiting., Disp: 30 tablet, Rfl: 0 .  tamsulosin (FLOMAX) 0.4 MG CAPS capsule, Take 0.4 mg by mouth daily., Disp: , Rfl:

## 2019-03-02 NOTE — Patient Instructions (Signed)
Sling x 2 more weeks   Start therapy   For pain  Percocet ibuprofen  Muscle relaxer

## 2019-03-07 ENCOUNTER — Ambulatory Visit: Payer: Medicare HMO | Admitting: Physical Therapy

## 2019-03-08 ENCOUNTER — Other Ambulatory Visit: Payer: Self-pay | Admitting: Orthopedic Surgery

## 2019-03-08 DIAGNOSIS — Z9889 Other specified postprocedural states: Secondary | ICD-10-CM

## 2019-03-08 MED ORDER — OXYCODONE-ACETAMINOPHEN 5-325 MG PO TABS: 1.0000 | ORAL_TABLET | ORAL | 0 refills | Status: AC | PRN

## 2019-03-08 NOTE — Telephone Encounter (Signed)
Patient called for refill: oxyCODONE-acetaminophen (PERCOCET/ROXICET) 5-325 MG tablet 42 tablet  -[facility] in [place]

## 2019-03-09 ENCOUNTER — Other Ambulatory Visit: Payer: Self-pay

## 2019-03-09 ENCOUNTER — Ambulatory Visit: Payer: Medicare HMO | Attending: Orthopedic Surgery | Admitting: Physical Therapy

## 2019-03-09 ENCOUNTER — Encounter: Payer: Self-pay | Admitting: Physical Therapy

## 2019-03-09 DIAGNOSIS — M25512 Pain in left shoulder: Secondary | ICD-10-CM | POA: Diagnosis not present

## 2019-03-09 DIAGNOSIS — M25612 Stiffness of left shoulder, not elsewhere classified: Secondary | ICD-10-CM

## 2019-03-09 NOTE — Therapy (Signed)
[facility] [place], [place], [address] Phone: [phone]   Fax:  [phone]  Physical Therapy Evaluation  Patient Details  Name: Willie Howe MRN: [id number] Date of Birth: [birth year] Referring Provider (PT): Arther Abbott MD   Encounter Date: 03/09/2019  PT End of Session - 03/09/19 1423    Visit Number  1    Number of Visits  16    Date for PT Re-Evaluation  06/07/19    Authorization Type  FOTO AT LEAST EVERY 5TH VISIT.  PROGRESS NOTE AT 10TH VISIT.  KX MODIFIER AFTER 15 VISITS.    PT Start Time  0115    PT Stop Time  0159    PT Time Calculation (min)  44 min    Activity Tolerance  Patient tolerated treatment well    Behavior During Therapy  WFL for tasks assessed/performed       Past Medical History:  Diagnosis Date  . Anginal pain ([place])   . Arthritis   . BPPV (benign paroxysmal positional vertigo)   . DVT (deep venous thrombosis) ([place])   . Dysrhythmia   . Graves disease 1996  . History of kidney stones   . History of pulmonary embolism   . Obesity   . Previous back surgery    x 3  . Rheumatoid arthritis ([place])   . Sleep apnea   . Thyroid disease    Graves Disease    Past Surgical History:  Procedure Laterality Date  . BACK SURGERY    . COLONOSCOPY    . EXAM UNDER ANESTHESIA WITH MANIPULATION OF KNEE Right 04/18/2015   Procedure: MANIPULATION OF RIGHT KNEE UNDER ANESTHESIA;  Surgeon: Carole Civil, MD;  Location: [facility];  Service: Orthopedics;  Laterality: Right;  . IVC Filter     removed December 2016  . [place]  2010  . KNEE ARTHROSCOPY Right 2006   meniscus   . KNEE ARTHROSCOPY WITH MEDIAL MENISECTOMY Left 04/24/2016   Procedure: LEFT KNEE ARTHROSCOPY WITH PARTIAL MEDIAL MENISECTOMY;  Surgeon: Carole Civil, MD;  Location: [facility];  Service: Orthopedics;  Laterality: Left;  . KNEE SURGERY     x 2  . KNEE SURGERY Left 1976   ?ligament repair   . LUMBAR WOUND  DEBRIDEMENT N/A 02/23/2014   Procedure: LUMBAR WOUND DEBRIDEMENT;  Surgeon: Faythe Ghee, MD;  Location: [facility];  Service: Neurosurgery;  Laterality: N/A;  exploration lumbar wound. repair of dural defect.  Marland Kitchen MENISECTOMY Right    open medial   . NEPHROLITHOTOMY    . PERIPHERAL VASCULAR CATHETERIZATION N/A 01/30/2015   Procedure: IVC Filter Insertion;  Surgeon: Serafina Mitchell, MD;  Location: [facility];  Service: Cardiovascular;  Laterality: N/A;  . PERIPHERAL VASCULAR CATHETERIZATION N/A 04/24/2015   Procedure: IVC Filter Removal;  Surgeon: Serafina [place], MD;  Location: [facility];  Service: Cardiovascular;  Laterality: N/A;  . PERIPHERAL VASCULAR CATHETERIZATION N/A 08/14/2015   Procedure: IVC Filter Removal;  Surgeon: Serafina [place], MD;  Location: [facility];  Service: Cardiovascular;  Laterality: N/A;  . SHOULDER ARTHROSCOPY WITH ROTATOR CUFF REPAIR AND OPEN BICEPS TENODESIS Left 02/15/2019   Procedure: SHOULDER ARTHROSCOPY WITH open ROTATOR CUFF REPAIR AND OPEN BICEPS TENODESIS;  Surgeon: Carole Civil, MD;  [facility];  Service: Orthopedics;  Laterality: Left;  . SPINE SURGERY  2015   Dr Hal Neer Fusion   . SPINE SURGERY  1999   discectomy  .  TOTAL KNEE ARTHROPLASTY Right 02/02/2015   Procedure:  RIGHT TOTAL KNEE ARTHROPLASTY;  Surgeon: Carole Civil, MD;  [facility];  Service: Orthopedics;  Laterality: Right;  LM with pt's daughter of new arrival time (10:45)    . TOTAL KNEE ARTHROPLASTY Left 07/24/2016   Procedure: TOTAL KNEE ARTHROPLASTY;  Surgeon: Carole Civil, MD;  Location: [facility];  Service: Orthopedics;  Laterality: Left;    There were no vitals filed for this visit.   Subjective Assessment - 03/09/19 1409    Subjective  COVID-19 screen performed prior to patient entering clinic. The patient reports falling in May of this year and injuring his left shoulder resulting in a RTC repair surgery performed on 02/15/19.  He has been  compliant to his sling usgae.  At rest his pain-level is a 4/10 and significantly higher with movement out of his sling.  Pain medication and no movement decrease his pain.    Pertinent History  Bilateral TKA's, DM, RA, spinal surgery.    Patient Stated Goals  Use left UE without pain.    Currently in Pain?  Yes    Pain Score  4     Pain Location  Shoulder    Pain Orientation  Left    Pain Descriptors / Indicators  Aching    Pain Type  Surgical pain    Pain Onset  More than a month ago    Pain Frequency  Constant    Aggravating Factors   See above.    Pain Relieving Factors  See above.         [facility] PT Assessment - 03/09/19 0001      Assessment   Medical Diagnosis  S/p left rotator cuff repair.    Referring Provider (PT)  Arther Abbott MD    Onset Date/Surgical Date  --   02/15/19(surgery date).     Precautions   Precautions  --   Begin with gentle left shoulder P and PAROM.     Restrictions   Weight Bearing Restrictions  --   No left UE weight bearing.     Balance Screen   Has the patient fallen in the past 6 months  Yes    How many times?  --   1.   Has the patient had a decrease in activity level because of a fear of falling?   No    Is the patient reluctant to leave their home because of a fear of falling?   No      Home Film/video editor residence      Prior Function   Level of Independence  Independent      Posture/Postural Control   Posture Comments  --   Guarded.     ROM / Strength   AROM / PROM / Strength  PROM      PROM   Overall PROM Comments  In supine:  The patient's left shoulder passive flexion= 70 degrees, IR to abdomen and ER to 20 degrees.      Palpation   Palpation comment  Mild tenderness over left anterior shoulder region.      Ambulation/Gait   Gait Comments  WNL.                Objective measurements completed on examination: See above findings.      [facility] Adult PT Treatment/Exercise -  03/09/19 0001      Modalities   Modalities  Vasopneumatic  Vasopneumatic   Number Minutes Vasopneumatic   15 minutes    Vasopnuematic Location   --   LT shoulder with pillow between LT elbow and thorax.              PT Short Term Goals - 03/09/19 1458      PT SHORT TERM GOAL #1   Title  STG's=LTG's        PT Long Term Goals - 03/09/19 1458      PT LONG TERM GOAL #1   Title  Independent with a HEP.    Time  8    Period  Weeks    Status  New      PT LONG TERM GOAL #2   Title  Active left shoulder flexion to 145-5 degrees so the patient can easily reach overhead.    Time  8    Period  Weeks    Status  New      PT LONG TERM GOAL #3   Title  Active ER to 70 degrees+ to allow for easily donning/doffing of apparel.    Time  8    Period  Weeks    Status  New      PT LONG TERM GOAL #4   Title  Increase ROM so patient is able to reach behind back to L3.    Time  8    Period  Weeks    Status  New      PT LONG TERM GOAL #5   Title  Increase left shoulder strength to a solid 4+/5 to increase stability for performance of functional activities.    Time  8    Period  Weeks    Status  New      PT LONG TERM GOAL #6   Title  Perform ADL's with pain not > 2-3/10.    Time  8    Period  Weeks    Status  New             Plan - 03/09/19 1450    Clinical Impression Statement  The patient presents to OPPT s/p left shoulder RTC repair and biceps tenotomy performed on 02/15/19.  As expected he has a loss of left shoulder passive range of motion.  He has been compliant to using his sling.  His FOTO score demonstrated a 71% limitation.  Patient will benefit from skilled physical therapy intervention to address deficits and pain.    Personal Factors and Comorbidities  Age;Comorbidity 1;Comorbidity 2    Comorbidities  RA, DM.    Examination-Activity Limitations  Dressing    Stability/Clinical Decision Making  Stable/Uncomplicated    Clinical Decision Making  Low     Rehab Potential  Excellent    PT Frequency  2x / week    PT Duration  8 weeks    PT Treatment/Interventions  ADLs/Self Care Home Management;Cryotherapy;Electrical Stimulation;Ultrasound;Moist Heat;Therapeutic activities;Therapeutic exercise;Manual techniques;Patient/family education;Passive range of motion;Vasopneumatic Device    PT Next Visit Plan  Begin with left shoulder P and PAROM, vasopneumatic on low with pillow between left elbow and thorax.  Please review HEP.       Patient will benefit from skilled therapeutic intervention in order to improve the following deficits and impairments:  Pain, Decreased activity tolerance, Decreased range of motion  Visit Diagnosis: 1. Acute pain of left shoulder   2. Stiffness of left shoulder, not elsewhere classified        Problem List Patient Active Problem List   Diagnosis  Date Noted  . S/P left rotator cuff repair/ biceps tenodesis 02/15/19  03/01/2019  . Labral tear of long head of left biceps tendon   . Partial tear of left subscapularis tendon   . Traumatic complete tear of left rotator cuff   . Family history of malignant neoplasm of prostate 07/28/2018  . History of Graves' disease 11/11/2017  . S/P total knee replacement, right 02/02/15 08/12/2017  . S/P total knee replacement, left 07/24/16 08/12/2017  . Hypercholesterolemia 11/08/2015  . History of DVT (deep vein thrombosis) 11/04/2015  . History of cocaine abuse ([place]) 11/04/2015  . History of heroin abuse ([place]) 11/04/2015  . Obesity 11/04/2015  . Parasomnia 11/04/2015  . Benign paroxysmal positional vertigo 10/29/2015  . Controlled type 2 diabetes mellitus without complication ([place]) 10/10/3610  . Elevated prostate specific antigen (PSA) 10/29/2015  . HX: long term anticoagulant use 10/29/2015  . Sensorineural hearing loss 10/29/2015  . Sleep apnea with use of continuous positive airway pressure (CPAP) 10/29/2015  . Arthrofibrosis of total knee arthroplasty ([place])   .  Pulmonary embolus ([place]) 01/09/2015  . UTI (lower urinary tract infection) 02/21/2014  . Other headache syndrome 02/21/2014  . Nausea with vomiting 02/21/2014  . Rheumatoid arthritis ([place]) 02/21/2014  . Nausea & vomiting 02/21/2014  . Headache(784.0) 02/21/2014  . Lumbar spinal stenosis 02/10/2014    Phoebe Marter, [place] MPT 03/09/2019, 3:14 PM  [facility] [place], [place], [address] Phone: [phone]   Fax:  [phone]  Name: Willie Howe MRN: [id number] Date of Birth: [birth year]

## 2019-03-11 ENCOUNTER — Ambulatory Visit: Payer: Medicare HMO | Admitting: Physical Therapy

## 2019-03-11 ENCOUNTER — Other Ambulatory Visit: Payer: Self-pay

## 2019-03-11 DIAGNOSIS — M25612 Stiffness of left shoulder, not elsewhere classified: Secondary | ICD-10-CM | POA: Diagnosis not present

## 2019-03-11 DIAGNOSIS — M25512 Pain in left shoulder: Secondary | ICD-10-CM | POA: Diagnosis not present

## 2019-03-11 NOTE — Therapy (Signed)
[facility] [place], [place], [address] Phone: [phone]   Fax:  [phone]  Physical Therapy Treatment  Patient Details  Name: Willie Howe MRN: [id number] Date of Birth: 10-Jan-1961 Referring Provider (PT): Arther Abbott MD   Encounter Date: 03/11/2019  PT End of Session - 03/11/19 1102    Visit Number  2    Number of Visits  16    Date for PT Re-Evaluation  06/07/19    Authorization Type  FOTO AT LEAST EVERY 5TH VISIT.  PROGRESS NOTE AT 10TH VISIT.  KX MODIFIER AFTER 15 VISITS.    PT Start Time  0815    PT Stop Time  0915    PT Time Calculation (min)  60 min       Past Medical History:  Diagnosis Date  . Anginal pain ([place])   . Arthritis   . BPPV (benign paroxysmal positional vertigo)   . DVT (deep venous thrombosis) ([place])   . Dysrhythmia   . Graves disease 1996  . History of kidney stones   . History of pulmonary embolism   . Obesity   . Previous back surgery    x 3  . Rheumatoid arthritis ([place])   . Sleep apnea   . Thyroid disease    Graves Disease    Past Surgical History:  Procedure Laterality Date  . BACK SURGERY    . COLONOSCOPY    . EXAM UNDER ANESTHESIA WITH MANIPULATION OF KNEE Right 04/18/2015   Procedure: MANIPULATION OF RIGHT KNEE UNDER ANESTHESIA;  Surgeon: Carole Civil, MD;  Location: [facility];  Service: Orthopedics;  Laterality: Right;  . IVC Filter     removed December 2016  . [place]  2010  . KNEE ARTHROSCOPY Right 2006   meniscus   . KNEE ARTHROSCOPY WITH MEDIAL MENISECTOMY Left 04/24/2016   Procedure: LEFT KNEE ARTHROSCOPY WITH PARTIAL MEDIAL MENISECTOMY;  Surgeon: Carole Civil, MD;  Location: [facility];  Service: Orthopedics;  Laterality: Left;  . KNEE SURGERY     x 2  . KNEE SURGERY Left 1976   ?ligament repair   . LUMBAR WOUND DEBRIDEMENT N/A 02/23/2014   Procedure: LUMBAR WOUND DEBRIDEMENT;  Surgeon: Faythe Ghee, MD;  Location: [facility];  Service:  Neurosurgery;  Laterality: N/A;  exploration lumbar wound. repair of dural defect.  Marland Kitchen MENISECTOMY Right    open medial   . NEPHROLITHOTOMY    . PERIPHERAL VASCULAR CATHETERIZATION N/A 01/30/2015   Procedure: IVC Filter Insertion;  Surgeon: Serafina Mitchell, MD;  Location: [facility];  Service: Cardiovascular;  Laterality: N/A;  . PERIPHERAL VASCULAR CATHETERIZATION N/A 04/24/2015   Procedure: IVC Filter Removal;  Surgeon: Serafina Mitchell, MD;  Location: [facility];  Service: Cardiovascular;  Laterality: N/A;  . PERIPHERAL VASCULAR CATHETERIZATION N/A 08/14/2015   Procedure: IVC Filter Removal;  Surgeon: Serafina Mitchell, MD;  Location: [facility];  Service: Cardiovascular;  Laterality: N/A;  . SHOULDER ARTHROSCOPY WITH ROTATOR CUFF REPAIR AND OPEN BICEPS TENODESIS Left 02/15/2019   Procedure: SHOULDER ARTHROSCOPY WITH open ROTATOR CUFF REPAIR AND OPEN BICEPS TENODESIS;  Surgeon: Carole Civil, MD;  Location: [facility];  Service: Orthopedics;  Laterality: Left;  . SPINE SURGERY  2015   Dr Hal Neer Fusion   . SPINE SURGERY  1999   discectomy  . TOTAL KNEE ARTHROPLASTY Right 02/02/2015   Procedure:  RIGHT TOTAL KNEE ARTHROPLASTY;  Surgeon: Carole Civil, MD;  [facility];  Service: Orthopedics;  Laterality: Right;  LM with pt's daughter of new arrival time (10:45)    . TOTAL KNEE ARTHROPLASTY Left 07/24/2016   Procedure: TOTAL KNEE ARTHROPLASTY;  Surgeon: Carole Civil, MD;  Location: [facility];  Service: Orthopedics;  Laterality: Left;    There were no vitals filed for this visit.  Subjective Assessment - 03/11/19 1102    Subjective  COVID-19 screen performed prior to patient entering clinic.  No new complaints.    Pertinent History  Bilateral TKA's, DM, RA, spinal surgery.    Patient Stated Goals  Use left UE without pain.    Currently in Pain?  Yes    Pain Score  4     Pain Location  Shoulder    Pain Orientation  Left    Pain Descriptors / Indicators   Aching    Pain Type  Surgical pain    Pain Onset  More than a month ago                       [facility] Adult PT Treatment/Exercise - 03/11/19 0001      Modalities   Modalities  Vasopneumatic      Vasopneumatic   Number Minutes Vasopneumatic   15 minutes    Vasopnuematic Location   --   LT shld with pillow btw lt elbow and thorax.   Vasopneumatic Pressure  Low      Manual Therapy   Manual Therapy  Passive ROM    Passive ROM  In supine:  PROM x 38 minutes to patient's left shoulder with low load long duration stretching technique ultilized.               PT Short Term Goals - 03/09/19 1458      PT SHORT TERM GOAL #1   Title  STG's=LTG's        PT Long Term Goals - 03/09/19 1458      PT LONG TERM GOAL #1   Title  Independent with a HEP.    Time  8    Period  Weeks    Status  New      PT LONG TERM GOAL #2   Title  Active left shoulder flexion to 145-5 degrees so the patient can easily reach overhead.    Time  8    Period  Weeks    Status  New      PT LONG TERM GOAL #3   Title  Active ER to 70 degrees+ to allow for easily donning/doffing of apparel.    Time  8    Period  Weeks    Status  New      PT LONG TERM GOAL #4   Title  Increase ROM so patient is able to reach behind back to L3.    Time  8    Period  Weeks    Status  New      PT LONG TERM GOAL #5   Title  Increase left shoulder strength to a solid 4+/5 to increase stability for performance of functional activities.    Time  8    Period  Weeks    Status  New      PT LONG TERM GOAL #6   Title  Perform ADL's with pain not > 2-3/10.    Time  8    Period  Weeks    Status  New  Plan - 03/11/19 1255    Clinical Impression Statement  The patient tolerated passive range of motion to his left shoulder today.  He has been compliant to his HEP.    PT Treatment/Interventions  ADLs/Self Care Home Management;Cryotherapy;Electrical Stimulation;Ultrasound;Moist  Heat;Therapeutic activities;Therapeutic exercise;Manual techniques;Patient/family education;Passive range of motion;Vasopneumatic Device    PT Next Visit Plan  Begin with left shoulder P and PAROM, vasopneumatic on low with pillow between left elbow and thorax.  Please review HEP.       Patient will benefit from skilled therapeutic intervention in order to improve the following deficits and impairments:  Pain, Decreased activity tolerance, Decreased range of motion  Visit Diagnosis: 1. Acute pain of left shoulder   2. Stiffness of left shoulder, not elsewhere classified        Problem List Patient Active Problem List   Diagnosis Date Noted  . S/P left rotator cuff repair/ biceps tenodesis 02/15/19  03/01/2019  . Labral tear of long head of left biceps tendon   . Partial tear of left subscapularis tendon   . Traumatic complete tear of left rotator cuff   . Family history of malignant neoplasm of prostate 07/28/2018  . History of Graves' disease 11/11/2017  . S/P total knee replacement, right 02/02/15 08/12/2017  . S/P total knee replacement, left 07/24/16 08/12/2017  . Hypercholesterolemia 11/08/2015  . History of DVT (deep vein thrombosis) 11/04/2015  . History of cocaine abuse ([place]) 11/04/2015  . History of heroin abuse ([place]) 11/04/2015  . Obesity 11/04/2015  . Parasomnia 11/04/2015  . Benign paroxysmal positional vertigo 10/29/2015  . Controlled type 2 diabetes mellitus without complication ([place]) 41/96/2229  . Elevated prostate specific antigen (PSA) 10/29/2015  . HX: long term anticoagulant use 10/29/2015  . Sensorineural hearing loss 10/29/2015  . Sleep apnea with use of continuous positive airway pressure (CPAP) 10/29/2015  . Arthrofibrosis of total knee arthroplasty ([place])   . Pulmonary embolus ([place]) 01/09/2015  . UTI (lower urinary tract infection) 02/21/2014  . Other headache syndrome 02/21/2014  . Nausea with vomiting 02/21/2014  . Rheumatoid arthritis ([place]) 02/21/2014   . Nausea & vomiting 02/21/2014  . Headache(784.0) 02/21/2014  . Lumbar spinal stenosis 02/10/2014    Kassady Laboy, [place] MPT 03/11/2019, 12:56 PM  [facility] [address] [place], [place], [address] Phone: [phone]   Fax:  [phone]  Name: ZAKHI DUPRE MRN: [id number] Date of Birth: 11-25-1960

## 2019-03-16 ENCOUNTER — Other Ambulatory Visit: Payer: Self-pay

## 2019-03-16 ENCOUNTER — Ambulatory Visit: Payer: Medicare HMO | Admitting: Physical Therapy

## 2019-03-16 ENCOUNTER — Encounter: Payer: Self-pay | Admitting: Physical Therapy

## 2019-03-16 DIAGNOSIS — M25612 Stiffness of left shoulder, not elsewhere classified: Secondary | ICD-10-CM

## 2019-03-16 DIAGNOSIS — M25512 Pain in left shoulder: Secondary | ICD-10-CM

## 2019-03-16 NOTE — Therapy (Signed)
[facility] [place], [place], [address] Phone: [phone]   Fax:  [phone]  Physical Therapy Treatment  Patient Details  Name: Willie Howe MRN: [id number] Date of Birth: 08-21-1960 Referring Provider (PT): Arther Abbott MD   Encounter Date: 03/16/2019  PT End of Session - 03/16/19 0818    Visit Number  3    Number of Visits  16    Date for PT Re-Evaluation  06/07/19    Authorization Type  FOTO AT LEAST EVERY 5TH VISIT.  PROGRESS NOTE AT 10TH VISIT.  KX MODIFIER AFTER 15 VISITS.    PT Start Time  0818    PT Stop Time  0901    PT Time Calculation (min)  43 min    Activity Tolerance  Patient tolerated treatment well    Behavior During Therapy  WFL for tasks assessed/performed       Past Medical History:  Diagnosis Date  . Anginal pain ([place])   . Arthritis   . BPPV (benign paroxysmal positional vertigo)   . DVT (deep venous thrombosis) ([place])   . Dysrhythmia   . Graves disease 1996  . History of kidney stones   . History of pulmonary embolism   . Obesity   . Previous back surgery    x 3  . Rheumatoid arthritis ([place])   . Sleep apnea   . Thyroid disease    Graves Disease    Past Surgical History:  Procedure Laterality Date  . BACK SURGERY    . COLONOSCOPY    . EXAM UNDER ANESTHESIA WITH MANIPULATION OF KNEE Right 04/18/2015   Procedure: MANIPULATION OF RIGHT KNEE UNDER ANESTHESIA;  Surgeon: Carole Civil, MD;  Location: [facility];  Service: Orthopedics;  Laterality: Right;  . IVC Filter     removed December 2016  . [place]  2010  . KNEE ARTHROSCOPY Right 2006   meniscus   . KNEE ARTHROSCOPY WITH MEDIAL MENISECTOMY Left 04/24/2016   Procedure: LEFT KNEE ARTHROSCOPY WITH PARTIAL MEDIAL MENISECTOMY;  Surgeon: Carole Civil, MD;  Location: [facility];  Service: Orthopedics;  Laterality: Left;  . KNEE SURGERY     x 2  . KNEE SURGERY Left 1976   ?ligament repair   . LUMBAR WOUND  DEBRIDEMENT N/A 02/23/2014   Procedure: LUMBAR WOUND DEBRIDEMENT;  Surgeon: Faythe Ghee, MD;  Location: [facility];  Service: Neurosurgery;  Laterality: N/A;  exploration lumbar wound. repair of dural defect.  Marland Kitchen MENISECTOMY Right    open medial   . NEPHROLITHOTOMY    . PERIPHERAL VASCULAR CATHETERIZATION N/A 01/30/2015   Procedure: IVC Filter Insertion;  Surgeon: Serafina Mitchell, MD;  Location: [facility];  Service: Cardiovascular;  Laterality: N/A;  . PERIPHERAL VASCULAR CATHETERIZATION N/A 04/24/2015   Procedure: IVC Filter Removal;  Surgeon: Serafina Mitchell, MD;  Location: [facility];  Service: Cardiovascular;  Laterality: N/A;  . PERIPHERAL VASCULAR CATHETERIZATION N/A 08/14/2015   Procedure: IVC Filter Removal;  Surgeon: Serafina Mitchell, MD;  Location: [facility];  Service: Cardiovascular;  Laterality: N/A;  . SHOULDER ARTHROSCOPY WITH ROTATOR CUFF REPAIR AND OPEN BICEPS TENODESIS Left 02/15/2019   Procedure: SHOULDER ARTHROSCOPY WITH open ROTATOR CUFF REPAIR AND OPEN BICEPS TENODESIS;  Surgeon: Carole Civil, MD;  Location: [facility];  Service: Orthopedics;  Laterality: Left;  . SPINE SURGERY  2015   Dr Hal Neer Fusion   . SPINE SURGERY  1999   discectomy  .  TOTAL KNEE ARTHROPLASTY Right 02/02/2015   Procedure:  RIGHT TOTAL KNEE ARTHROPLASTY;  Surgeon: Carole Civil, MD;  [facility]: [facility];  Service: Orthopedics;  Laterality: Right;  LM with pt's daughter of new arrival time (10:45)    . TOTAL KNEE ARTHROPLASTY Left 07/24/2016   Procedure: TOTAL KNEE ARTHROPLASTY;  Surgeon: Carole Civil, MD;  Location: [facility];  Service: Orthopedics;  Laterality: Left;    There were no vitals filed for this visit.  Subjective Assessment - 03/16/19 0817    Subjective  COVID-19 screen performed prior to patient entering clinic.  Reports shoulder discomfort is not that bad today.    Pertinent History  Bilateral TKA's, DM, RA, spinal surgery.    Patient Stated Goals  Use left  UE without pain.    Currently in Pain?  Yes    Pain Score  3     Pain Location  Shoulder    Pain Orientation  Left    Pain Descriptors / Indicators  Tender    Pain Type  Surgical pain    Pain Onset  More than a month ago    Pain Frequency  Constant         [facility] PT Assessment - 03/16/19 0001      Assessment   Medical Diagnosis  S/p left rotator cuff repair.    Referring Provider (PT)  Arther Abbott MD    Onset Date/Surgical Date  02/15/19    Next MD Visit  03/24/2019                   [facility] Adult PT Treatment/Exercise - 03/16/19 0001      Modalities   Modalities  Electrical Stimulation;Vasopneumatic      Electrical Stimulation   Electrical Stimulation Location  L shoulder    Electrical Stimulation Action  Pre-Mod    Electrical Stimulation Parameters  80-150 hz x15 min    Electrical Stimulation Goals  Pain      Vasopneumatic   Number Minutes Vasopneumatic   15 minutes    Vasopnuematic Location   Shoulder    Vasopneumatic Pressure  Low    Vasopneumatic Temperature   42      Manual Therapy   Manual Therapy  Passive ROM    Passive ROM  PROM of L shoulder into flexion, ER, IR with gentle holds at end range             PT Education - 03/16/19 0854    Education Details  HEP- pendulums, closed chain flexion stretch, AAROM ER (provided at evaluation)    Person(s) Educated  Patient    Methods  Explanation;Handout    Comprehension  Verbalized understanding       PT Short Term Goals - 03/09/19 1458      PT SHORT TERM GOAL #1   Title  STG's=LTG's        PT Long Term Goals - 03/16/19 0851      PT LONG TERM GOAL #1   Title  Independent with a HEP.    Time  8    Period  Weeks    Status  Achieved      PT LONG TERM GOAL #2   Title  Active left shoulder flexion to 145-5 degrees so the patient can easily reach overhead.    Time  8    Period  Weeks    Status  On-going      PT LONG TERM GOAL #3   Title  Active ER  to 70 degrees+ to allow for  easily donning/doffing of apparel.    Time  8    Period  Weeks    Status  On-going      PT LONG TERM GOAL #4   Title  Increase ROM so patient is able to reach behind back to L3.    Time  8    Period  Weeks    Status  On-going      PT LONG TERM GOAL #5   Title  Increase left shoulder strength to a solid 4+/5 to increase stability for performance of functional activities.    Time  8    Period  Weeks    Status  On-going      PT LONG TERM GOAL #6   Title  Perform ADL's with pain not > 2-3/10.    Time  8    Period  Weeks    Status  On-going            Plan - 03/16/19 4097    Clinical Impression Statement  Patient presented in clinic with minimal L shoulder discomfort. Patient reports compliance with HEP provided at evaluation as noted by impressive improvement with L shoulder ER. Patient able to tolerate gentle PROM of L shoulder in flexion, ER, IR. Firm end feels and smooth arc of motion noted with all directions of PROM of L shoulder. Normal modalities response noted following removal of the modalities.    Personal Factors and Comorbidities  Age;Comorbidity 1;Comorbidity 2    Comorbidities  RA, DM.    Examination-Activity Limitations  Dressing    Stability/Clinical Decision Making  Stable/Uncomplicated    Rehab Potential  Excellent    PT Frequency  2x / week    PT Duration  8 weeks    PT Treatment/Interventions  ADLs/Self Care Home Management;Cryotherapy;Electrical Stimulation;Ultrasound;Moist Heat;Therapeutic activities;Therapeutic exercise;Manual techniques;Patient/family education;Passive range of motion;Vasopneumatic Device    PT Next Visit Plan  Begin with left shoulder P and PAROM, vasopneumatic on low with pillow between left elbow and thorax.  Please review HEP.    PT Home Exercise Plan  HEP- pendulums, closed chain flexion stretch, AAROM ER    Consulted and Agree with Plan of Care  Patient       Patient will benefit from skilled therapeutic intervention in order to  improve the following deficits and impairments:  Pain, Decreased activity tolerance, Decreased range of motion  Visit Diagnosis: 1. Acute pain of left shoulder   2. Stiffness of left shoulder, not elsewhere classified        Problem List Patient Active Problem List   Diagnosis Date Noted  . S/P left rotator cuff repair/ biceps tenodesis 02/15/19  03/01/2019  . Labral tear of long head of left biceps tendon   . Partial tear of left subscapularis tendon   . Traumatic complete tear of left rotator cuff   . Family history of malignant neoplasm of prostate 07/28/2018  . History of Graves' disease 11/11/2017  . S/P total knee replacement, right 02/02/15 08/12/2017  . S/P total knee replacement, left 07/24/16 08/12/2017  . Hypercholesterolemia 11/08/2015  . History of DVT (deep vein thrombosis) 11/04/2015  . History of cocaine abuse ([place]) 11/04/2015  . History of heroin abuse ([place]) 11/04/2015  . Obesity 11/04/2015  . Parasomnia 11/04/2015  . Benign paroxysmal positional vertigo 10/29/2015  . Controlled type 2 diabetes mellitus without complication ([place]) 35/32/9924  . Elevated prostate specific antigen (PSA) 10/29/2015  . HX: long term anticoagulant use 10/29/2015  .  Sensorineural hearing loss 10/29/2015  . Sleep apnea with use of continuous positive airway pressure (CPAP) 10/29/2015  . Arthrofibrosis of total knee arthroplasty ([place])   . Pulmonary embolus ([place]) 01/09/2015  . UTI (lower urinary tract infection) 02/21/2014  . Other headache syndrome 02/21/2014  . Nausea with vomiting 02/21/2014  . Rheumatoid arthritis ([place]) 02/21/2014  . Nausea & vomiting 02/21/2014  . Headache(784.0) 02/21/2014  . Lumbar spinal stenosis 02/10/2014    Standley Brooking, PTA 03/16/2019, 9:11 AM  [facility], [place], [address] Phone: [phone]   Fax:  [phone]  Name: Willie Howe MRN: [id number] Date of Birth: 1961/04/04

## 2019-03-21 ENCOUNTER — Other Ambulatory Visit: Payer: Self-pay | Admitting: Orthopedic Surgery

## 2019-03-21 DIAGNOSIS — Z9889 Other specified postprocedural states: Secondary | ICD-10-CM

## 2019-03-21 MED ORDER — HYDROCODONE-ACETAMINOPHEN 10-325 MG PO TABS: 1.0000 | ORAL_TABLET | ORAL | 0 refills | Status: DC | PRN

## 2019-03-21 MED ORDER — HYDROCODONE-ACETAMINOPHEN 10-325 MG PO TABS: 1.0000 | ORAL_TABLET | ORAL | 0 refills | Status: AC | PRN

## 2019-03-21 NOTE — Progress Notes (Signed)
Change from percocet

## 2019-03-21 NOTE — Telephone Encounter (Signed)
oxyCODONE-acetaminophen (PERCOCET/ROXICET) 5-325 MG tablet 42 tablet 0   - [facility], [place]      - per patient, refill request; aware of scheduled appointment next week, 03/30/19

## 2019-03-23 ENCOUNTER — Ambulatory Visit: Payer: Medicare HMO | Attending: Orthopedic Surgery | Admitting: Physical Therapy

## 2019-03-23 ENCOUNTER — Other Ambulatory Visit: Payer: Self-pay

## 2019-03-23 ENCOUNTER — Encounter: Payer: Self-pay | Admitting: Physical Therapy

## 2019-03-23 DIAGNOSIS — M25612 Stiffness of left shoulder, not elsewhere classified: Secondary | ICD-10-CM | POA: Diagnosis not present

## 2019-03-23 DIAGNOSIS — M25512 Pain in left shoulder: Secondary | ICD-10-CM | POA: Insufficient documentation

## 2019-03-23 NOTE — Therapy (Signed)
[facility] [place], [place], [address] Phone: [phone]   Fax:  [phone]  Physical Therapy Treatment  Patient Details  Name: Willie Howe MRN: [id number] Date of Birth: [birth year] Referring Provider (PT): Arther Abbott MD   Encounter Date: 03/23/2019  PT End of Session - 03/23/19 0812    Visit Number  4    Number of Visits  16    Date for PT Re-Evaluation  06/07/19    Authorization Type  FOTO AT LEAST EVERY 5TH VISIT.  PROGRESS NOTE AT 10TH VISIT.  KX MODIFIER AFTER 15 VISITS.    PT Start Time  [phone]    PT Stop Time  0901    PT Time Calculation (min)  42 min    Activity Tolerance  Patient tolerated treatment well    Behavior During Therapy  WFL for tasks assessed/performed       Past Medical History:  Diagnosis Date  . Anginal pain ([place])   . Arthritis   . BPPV (benign paroxysmal positional vertigo)   . DVT (deep venous thrombosis) ([place])   . Dysrhythmia   . Graves disease 1996  . History of kidney stones   . History of pulmonary embolism   . Obesity   . Previous back surgery    x 3  . Rheumatoid arthritis ([place])   . Sleep apnea   . Thyroid disease    Graves Disease    Past Surgical History:  Procedure Laterality Date  . BACK SURGERY    . COLONOSCOPY    . EXAM UNDER ANESTHESIA WITH MANIPULATION OF KNEE Right 04/18/2015   Procedure: MANIPULATION OF RIGHT KNEE UNDER ANESTHESIA;  Surgeon: Carole Civil, MD;  Location: [facility];  Service: Orthopedics;  Laterality: Right;  . IVC Filter     removed December 2016  . [place]  2010  . KNEE ARTHROSCOPY Right 2006   meniscus   . KNEE ARTHROSCOPY WITH MEDIAL MENISECTOMY Left 04/24/2016   Procedure: LEFT KNEE ARTHROSCOPY WITH PARTIAL MEDIAL MENISECTOMY;  Surgeon: Carole Civil, MD;  Location: [facility];  Service: Orthopedics;  Laterality: Left;  . KNEE SURGERY     x 2  . KNEE SURGERY Left 1976   ?ligament repair   . LUMBAR WOUND DEBRIDEMENT  N/A 02/23/2014   Procedure: LUMBAR WOUND DEBRIDEMENT;  Surgeon: Faythe Ghee, MD;  Location: [facility];  Service: Neurosurgery;  Laterality: N/A;  exploration lumbar wound. repair of dural defect.  Marland Kitchen MENISECTOMY Right    open medial   . NEPHROLITHOTOMY    . PERIPHERAL VASCULAR CATHETERIZATION N/A 01/30/2015   Procedure: IVC Filter Insertion;  Surgeon: Serafina Mitchell, MD;  Location: [facility];  Service: Cardiovascular;  Laterality: N/A;  . PERIPHERAL VASCULAR CATHETERIZATION N/A 04/24/2015   Procedure: IVC Filter Removal;  Surgeon: Serafina Mitchell, MD;  Location: [facility];  Service: Cardiovascular;  Laterality: N/A;  . PERIPHERAL VASCULAR CATHETERIZATION N/A 08/14/2015   Procedure: IVC Filter Removal;  Surgeon: Serafina Mitchell, MD;  Location: [facility];  Service: Cardiovascular;  Laterality: N/A;  . SHOULDER ARTHROSCOPY WITH ROTATOR CUFF REPAIR AND OPEN BICEPS TENODESIS Left 02/15/2019   Procedure: SHOULDER ARTHROSCOPY WITH open ROTATOR CUFF REPAIR AND OPEN BICEPS TENODESIS;  Surgeon: Carole Civil, MD;  [facility]: [facility];  Service: Orthopedics;  Laterality: Left;  . SPINE SURGERY  2015   Dr Hal Neer Fusion   . SPINE SURGERY  1999   discectomy  .  TOTAL KNEE ARTHROPLASTY Right 02/02/2015   Procedure:  RIGHT TOTAL KNEE ARTHROPLASTY;  Surgeon: Carole Civil, MD;  [facility]: [facility];  Service: Orthopedics;  Laterality: Right;  LM with pt's daughter of new arrival time (10:45)    . TOTAL KNEE ARTHROPLASTY Left 07/24/2016   Procedure: TOTAL KNEE ARTHROPLASTY;  Surgeon: Carole Civil, MD;  Location: [facility];  Service: Orthopedics;  Laterality: Left;    There were no vitals filed for this visit.  Subjective Assessment - 03/23/19 0811    Subjective  COVID-19 screen performed prior to patient entering clinic. Reports some discomfort recently.    Pertinent History  Bilateral TKA's, DM, RA, spinal surgery.    Patient Stated Goals  Use left UE without pain.    Currently  in Pain?  Yes    Pain Score  4     Pain Location  Shoulder    Pain Orientation  Left    Pain Descriptors / Indicators  Sore;Throbbing    Pain Type  Surgical pain    Pain Onset  More than a month ago         [facility] PT Assessment - 03/23/19 0001      Assessment   Medical Diagnosis  S/p left rotator cuff repair.    Referring Provider (PT)  Arther Abbott MD    Onset Date/Surgical Date  02/15/19    Next MD Visit  03/2019                   [facility] Adult PT Treatment/Exercise - 03/23/19 0001      Modalities   Modalities  Electrical Stimulation;Vasopneumatic      Electrical Stimulation   Electrical Stimulation Location  L shoulder    Electrical Stimulation Action  IFC    Electrical Stimulation Parameters  80-150 hz x15 min    Electrical Stimulation Goals  Pain      Vasopneumatic   Number Minutes Vasopneumatic   15 minutes    Vasopnuematic Location   Shoulder    Vasopneumatic Pressure  Low    Vasopneumatic Temperature   34      Manual Therapy   Manual Therapy  Passive ROM    Passive ROM  PROM of L shoulder into flexion, ER, IR with gentle holds at end range               PT Short Term Goals - 03/09/19 1458      PT SHORT TERM GOAL #1   Title  STG's=LTG's        PT Long Term Goals - 03/16/19 0851      PT LONG TERM GOAL #1   Title  Independent with a HEP.    Time  8    Period  Weeks    Status  Achieved      PT LONG TERM GOAL #2   Title  Active left shoulder flexion to 145-5 degrees so the patient can easily reach overhead.    Time  8    Period  Weeks    Status  On-going      PT LONG TERM GOAL #3   Title  Active ER to 70 degrees+ to allow for easily donning/doffing of apparel.    Time  8    Period  Weeks    Status  On-going      PT LONG TERM GOAL #4   Title  Increase ROM so patient is able to reach behind back to L3.    Time  8    Period  Weeks    Status  On-going      PT LONG TERM GOAL #5   Title  Increase left shoulder strength to  a solid 4+/5 to increase stability for performance of functional activities.    Time  8    Period  Weeks    Status  On-going      PT LONG TERM GOAL #6   Title  Perform ADL's with pain not > 2-3/10.    Time  8    Period  Weeks    Status  On-going            Plan - 03/23/19 0901    Clinical Impression Statement  Patient presented in clinic with reports of increased discomfort in L shoulder. Increased inflammation and muscle guarding palpable in L deltoids. Firm end feels and smooth arc of motion noted during PROM of L shoulder in all directions. Patient educated to be aware and allow L shoulder to rest and avoid any unnecessary activites with LUE. Normal modalities response noted following removal of the modalities.    Personal Factors and Comorbidities  Age;Comorbidity 1;Comorbidity 2    Comorbidities  RA, DM.    Examination-Activity Limitations  Dressing    Stability/Clinical Decision Making  Stable/Uncomplicated    Rehab Potential  Excellent    PT Frequency  2x / week    PT Duration  8 weeks    PT Treatment/Interventions  ADLs/Self Care Home Management;Cryotherapy;Electrical Stimulation;Ultrasound;Moist Heat;Therapeutic activities;Therapeutic exercise;Manual techniques;Patient/family education;Passive range of motion;Vasopneumatic Device    PT Next Visit Plan  Begin with left shoulder P and PAROM, vasopneumatic on low with pillow between left elbow and thorax.  Please review HEP.    PT Home Exercise Plan  HEP- pendulums, closed chain flexion stretch, AAROM ER    Consulted and Agree with Plan of Care  Patient       Patient will benefit from skilled therapeutic intervention in order to improve the following deficits and impairments:  Pain, Decreased activity tolerance, Decreased range of motion  Visit Diagnosis: 1. Acute pain of left shoulder   2. Stiffness of left shoulder, not elsewhere classified        Problem List Patient Active Problem List   Diagnosis Date Noted   . S/P left rotator cuff repair/ biceps tenodesis 02/15/19  03/01/2019  . Labral tear of long head of left biceps tendon   . Partial tear of left subscapularis tendon   . Traumatic complete tear of left rotator cuff   . Family history of malignant neoplasm of prostate 07/28/2018  . History of Graves' disease 11/11/2017  . S/P total knee replacement, right 02/02/15 08/12/2017  . S/P total knee replacement, left 07/24/16 08/12/2017  . Hypercholesterolemia 11/08/2015  . History of DVT (deep vein thrombosis) 11/04/2015  . History of cocaine abuse ([place]) 11/04/2015  . History of heroin abuse ([place]) 11/04/2015  . Obesity 11/04/2015  . Parasomnia 11/04/2015  . Benign paroxysmal positional vertigo 10/29/2015  . Controlled type 2 diabetes mellitus without complication ([place]) 24/40/1027  . Elevated prostate specific antigen (PSA) 10/29/2015  . HX: long term anticoagulant use 10/29/2015  . Sensorineural hearing loss 10/29/2015  . Sleep apnea with use of continuous positive airway pressure (CPAP) 10/29/2015  . Arthrofibrosis of total knee arthroplasty ([place])   . Pulmonary embolus ([place]) 01/09/2015  . UTI (lower urinary tract infection) 02/21/2014  . Other headache syndrome 02/21/2014  . Nausea with vomiting 02/21/2014  . Rheumatoid arthritis ([place]) 02/21/2014  .  Nausea & vomiting 02/21/2014  . Headache(784.0) 02/21/2014  . Lumbar spinal stenosis 02/10/2014    Standley Brooking, PTA 03/23/2019, 9:46 AM  [facility] [place], [place], [address] Phone: [phone]   Fax:  [phone]  Name: Willie Howe MRN: [id number] Date of Birth: 1961/03/18

## 2019-03-30 ENCOUNTER — Other Ambulatory Visit: Payer: Self-pay

## 2019-03-30 ENCOUNTER — Ambulatory Visit (INDEPENDENT_AMBULATORY_CARE_PROVIDER_SITE_OTHER): Payer: Medicare HMO | Admitting: Orthopedic Surgery

## 2019-03-30 ENCOUNTER — Encounter: Payer: Self-pay | Admitting: Orthopedic Surgery

## 2019-03-30 ENCOUNTER — Ambulatory Visit: Payer: Medicare HMO | Admitting: Physical Therapy

## 2019-03-30 VITALS — BP 148/86 | HR 87 | Ht 75.0 in | Wt 282.0 lb

## 2019-03-30 DIAGNOSIS — M25512 Pain in left shoulder: Secondary | ICD-10-CM

## 2019-03-30 DIAGNOSIS — M25612 Stiffness of left shoulder, not elsewhere classified: Secondary | ICD-10-CM

## 2019-03-30 DIAGNOSIS — Z9889 Other specified postprocedural states: Secondary | ICD-10-CM

## 2019-03-30 MED ORDER — HYDROCODONE-ACETAMINOPHEN 10-325 MG PO TABS: 1.0000 | ORAL_TABLET | ORAL | 0 refills | Status: DC | PRN

## 2019-03-30 MED ORDER — HYDROCODONE-ACETAMINOPHEN 5-325 MG PO TABS: 1.0000 | ORAL_TABLET | Freq: Four times a day (QID) | ORAL | 0 refills | Status: DC | PRN

## 2019-03-30 NOTE — Progress Notes (Signed)
Chief Complaint  Patient presents with  . Routine Post Op    02/15/19 left shoulder / some soreness / going to therapy   . Medication Refill    refill of Hydrocodone   Overall Willie Howe doing pretty well he is having some discomfort upper biceps at night with some spasms  He is on Robaxin ibuprofen and hydrocodone 10  His motion is excellent passively  Encounter Diagnosis  Name Primary?  . S/P left rotator cuff repair/ biceps tenodesis 02/15/19  Yes    I have asked him to put some heat and take his Robaxin about 9:00 since he goes to bed at 10  Continue therapy  Follow-up in 4 weeks

## 2019-03-30 NOTE — Therapy (Signed)
[facility] [place], [place], [address] Phone: [phone]   Fax:  [phone]  Physical Therapy Treatment  Patient Details  Name: Willie Howe MRN: [id number] Date of Birth: October 04, 1960 Referring Provider (PT): Arther Abbott MD   Encounter Date: 03/30/2019  PT End of Session - 03/30/19 1027    Visit Number  5    Number of Visits  16    Date for PT Re-Evaluation  06/07/19    Authorization Type  FOTO AT LEAST EVERY 5TH VISIT.  PROGRESS NOTE AT 10TH VISIT.  KX MODIFIER AFTER 15 VISITS.    PT Start Time  0815    PT Stop Time  0917    PT Time Calculation (min)  62 min    Activity Tolerance  Patient tolerated treatment well    Behavior During Therapy  [facility] for tasks assessed/performed       Past Medical History:  Diagnosis Date  . Anginal pain ([place])   . Arthritis   . BPPV (benign paroxysmal positional vertigo)   . DVT (deep venous thrombosis) ([place])   . Dysrhythmia   . Graves disease 1996  . History of kidney stones   . History of pulmonary embolism   . Obesity   . Previous back surgery    x 3  . Rheumatoid arthritis ([place])   . Sleep apnea   . Thyroid disease    Graves Disease    Past Surgical History:  Procedure Laterality Date  . BACK SURGERY    . COLONOSCOPY    . EXAM UNDER ANESTHESIA WITH MANIPULATION OF KNEE Right 04/18/2015   Procedure: MANIPULATION OF RIGHT KNEE UNDER ANESTHESIA;  Surgeon: Carole Civil, MD;  [facility];  Service: Orthopedics;  Laterality: Right;  . IVC Filter     removed December 2016  . [place]  2010  . KNEE ARTHROSCOPY Right 2006   meniscus   . KNEE ARTHROSCOPY WITH MEDIAL MENISECTOMY Left 04/24/2016   Procedure: LEFT KNEE ARTHROSCOPY WITH PARTIAL MEDIAL MENISECTOMY;  Surgeon: Carole Civil, MD;  [facility]: [facility];  Service: Orthopedics;  Laterality: Left;  . KNEE SURGERY     x 2  . KNEE SURGERY Left 1976   ?ligament repair   . LUMBAR WOUND  DEBRIDEMENT N/A 02/23/2014   Procedure: LUMBAR WOUND DEBRIDEMENT;  Surgeon: Faythe Ghee, MD;  Location: [place];  Service: Neurosurgery;  Laterality: N/A;  exploration lumbar wound. repair of dural defect.  Marland Kitchen MENISECTOMY Right    open medial   . NEPHROLITHOTOMY    . PERIPHERAL VASCULAR CATHETERIZATION N/A 01/30/2015   Procedure: IVC Filter Insertion;  Surgeon: Serafina Mitchell, MD;  Location: [place] [facility];  Service: Cardiovascular;  Laterality: N/A;  . PERIPHERAL VASCULAR CATHETERIZATION N/A 04/24/2015   Procedure: IVC Filter Removal;  Surgeon: Serafina Mitchell, MD;  Location: [place] [facility];  Service: Cardiovascular;  Laterality: N/A;  . PERIPHERAL VASCULAR CATHETERIZATION N/A 08/14/2015   Procedure: IVC Filter Removal;  Surgeon: Serafina Mitchell, MD;  Location: [place] [facility];  Service: Cardiovascular;  Laterality: N/A;  . SHOULDER ARTHROSCOPY WITH ROTATOR CUFF REPAIR AND OPEN BICEPS TENODESIS Left 02/15/2019   Procedure: SHOULDER ARTHROSCOPY WITH open ROTATOR CUFF REPAIR AND OPEN BICEPS TENODESIS;  Surgeon: Carole Civil, MD;  [facility];  Service: Orthopedics;  Laterality: Left;  . SPINE SURGERY  2015   Dr Hal Neer Fusion   . SPINE SURGERY  1999   discectomy  .  TOTAL KNEE ARTHROPLASTY Right 02/02/2015   Procedure:  RIGHT TOTAL KNEE ARTHROPLASTY;  Surgeon: Carole Civil, MD;  [facility];  Service: Orthopedics;  Laterality: Right;  LM with pt's daughter of new arrival time (10:45)    . TOTAL KNEE ARTHROPLASTY Left 07/24/2016   Procedure: TOTAL KNEE ARTHROPLASTY;  Surgeon: Carole Civil, MD;  [facility];  Service: Orthopedics;  Laterality: Left;    There were no vitals filed for this visit.  Subjective Assessment - 03/30/19 1024    Subjective  COVID-19 screen performed prior to patient entering clinic. Some increased soreness.    Pertinent History  Bilateral TKA's, DM, RA, spinal surgery.    Patient Stated Goals  Use left UE without pain.     Currently in Pain?  Yes    Pain Score  5     Pain Location  Shoulder    Pain Orientation  Left    Pain Descriptors / Indicators  Throbbing;Sore    Pain Type  Surgical pain    Pain Onset  More than a month ago         [facility] PT Assessment - 03/30/19 0001      PROM   Overall PROM Comments  In supine:  Passive left shoulder flexion= 128 degrees and ER= 68 degreess.                   [facility] Adult PT Treatment/Exercise - 03/30/19 0001      Modalities   Modalities  Electrical Stimulation;Vasopneumatic      Electrical Stimulation   Electrical Stimulation Location  Left shoulder.    Electrical Stimulation Action  IFC (non-motoric).    Electrical Stimulation Parameters  80-150 Hz x 20 minutes.      Vasopneumatic   Number Minutes Vasopneumatic   20 minutes    Vasopnuematic Location   --   Left shoulder.   Vasopneumatic Pressure  Low      Manual Therapy   Manual Therapy  Passive ROM    Passive ROM  PROM to patient's left shoulder x 26 minutes.               PT Short Term Goals - 03/09/19 1458      PT SHORT TERM GOAL #1   Title  STG's=LTG's        PT Long Term Goals - 03/16/19 0851      PT LONG TERM GOAL #1   Title  Independent with a HEP.    Time  8    Period  Weeks    Status  Achieved      PT LONG TERM GOAL #2   Title  Active left shoulder flexion to 145-5 degrees so the patient can easily reach overhead.    Time  8    Period  Weeks    Status  On-going      PT LONG TERM GOAL #3   Title  Active ER to 70 degrees+ to allow for easily donning/doffing of apparel.    Time  8    Period  Weeks    Status  On-going      PT LONG TERM GOAL #4   Title  Increase ROM so patient is able to reach behind back to L3.    Time  8    Period  Weeks    Status  On-going      PT LONG TERM GOAL #5   Title  Increase left shoulder strength to a solid  4+/5 to increase stability for performance of functional activities.    Time  8    Period  Weeks    Status   On-going      PT LONG TERM GOAL #6   Title  Perform ADL's with pain not > 2-3/10.    Time  8    Period  Weeks    Status  On-going            Plan - 03/30/19 1028    Clinical Impression Statement  Patient's left shoulder passive range of motion has improved nicely since beginning PT.       Patient will benefit from skilled therapeutic intervention in order to improve the following deficits and impairments:     Visit Diagnosis: 1. Acute pain of left shoulder   2. Stiffness of left shoulder, not elsewhere classified        Problem List Patient Active Problem List   Diagnosis Date Noted  . S/P left rotator cuff repair/ biceps tenodesis 02/15/19  03/01/2019  . Labral tear of long head of left biceps tendon   . Partial tear of left subscapularis tendon   . Traumatic complete tear of left rotator cuff   . Family history of malignant neoplasm of prostate 07/28/2018  . History of Graves' disease 11/11/2017  . S/P total knee replacement, right 02/02/15 08/12/2017  . S/P total knee replacement, left 07/24/16 08/12/2017  . Hypercholesterolemia 11/08/2015  . History of DVT (deep vein thrombosis) 11/04/2015  . History of cocaine abuse ([place]) 11/04/2015  . History of heroin abuse ([place]) 11/04/2015  . Obesity 11/04/2015  . Parasomnia 11/04/2015  . Benign paroxysmal positional vertigo 10/29/2015  . Controlled type 2 diabetes mellitus without complication ([place]) 43/15/4008  . Elevated prostate specific antigen (PSA) 10/29/2015  . HX: long term anticoagulant use 10/29/2015  . Sensorineural hearing loss 10/29/2015  . Sleep apnea with use of continuous positive airway pressure (CPAP) 10/29/2015  . Arthrofibrosis of total knee arthroplasty ([place])   . Pulmonary embolus ([place]) 01/09/2015  . UTI (lower urinary tract infection) 02/21/2014  . Other headache syndrome 02/21/2014  . Nausea with vomiting 02/21/2014  . Rheumatoid arthritis ([place]) 02/21/2014  . Nausea & vomiting 02/21/2014  .  Headache(784.0) 02/21/2014  . Lumbar spinal stenosis 02/10/2014    Jodette Wik, [place] MPT 03/30/2019, 10:48 AM  [facility] [address] [place], [place], [address] Phone: [phone]   Fax:  [phone]  Name: Willie Howe MRN: [id number] Date of Birth: 08-25-1960

## 2019-04-06 ENCOUNTER — Ambulatory Visit: Payer: Medicare HMO | Admitting: Physical Therapy

## 2019-04-06 ENCOUNTER — Encounter: Payer: Self-pay | Admitting: Physical Therapy

## 2019-04-06 ENCOUNTER — Other Ambulatory Visit: Payer: Self-pay

## 2019-04-06 DIAGNOSIS — M25512 Pain in left shoulder: Secondary | ICD-10-CM

## 2019-04-06 DIAGNOSIS — M25612 Stiffness of left shoulder, not elsewhere classified: Secondary | ICD-10-CM

## 2019-04-06 NOTE — Therapy (Signed)
[facility] [place], [place], [address] Phone: [phone]   Fax:  [phone]  Physical Therapy Treatment  Patient Details  Name: Willie Howe MRN: [id number] Date of Birth: September 08, 1960 Referring Provider (PT): Arther Abbott MD   Encounter Date: 04/06/2019  PT End of Session - 04/06/19 0810    Visit Number  6    Number of Visits  16    Date for PT Re-Evaluation  06/07/19    Authorization Type  FOTO AT LEAST EVERY 5TH VISIT.  PROGRESS NOTE AT 10TH VISIT.  KX MODIFIER AFTER 15 VISITS.    PT Start Time  [phone]    PT Stop Time  0912    PT Time Calculation (min)  56 min    Activity Tolerance  Patient tolerated treatment well    Behavior During Therapy  [facility] for tasks assessed/performed       Past Medical History:  Diagnosis Date  . Anginal pain ([place])   . Arthritis   . BPPV (benign paroxysmal positional vertigo)   . DVT (deep venous thrombosis) ([place])   . Dysrhythmia   . Graves disease 1996  . History of kidney stones   . History of pulmonary embolism   . Obesity   . Previous back surgery    x 3  . Rheumatoid arthritis ([place])   . Sleep apnea   . Thyroid disease    Graves Disease    Past Surgical History:  Procedure Laterality Date  . BACK SURGERY    . COLONOSCOPY    . EXAM UNDER ANESTHESIA WITH MANIPULATION OF KNEE Right 04/18/2015   Procedure: MANIPULATION OF RIGHT KNEE UNDER ANESTHESIA;  Surgeon: Carole Civil, MD;  [facility];  Service: Orthopedics;  Laterality: Right;  . IVC Filter     removed December 2016  . [place]  2010  . KNEE ARTHROSCOPY Right 2006   meniscus   . KNEE ARTHROSCOPY WITH MEDIAL MENISECTOMY Left 04/24/2016   Procedure: LEFT KNEE ARTHROSCOPY WITH PARTIAL MEDIAL MENISECTOMY;  Surgeon: Carole Civil, MD;  [facility];  Service: Orthopedics;  Laterality: Left;  . KNEE SURGERY     x 2  . KNEE SURGERY Left 1976   ?ligament repair   . LUMBAR WOUND  DEBRIDEMENT N/A 02/23/2014   Procedure: LUMBAR WOUND DEBRIDEMENT;  Surgeon: Faythe Ghee, MD;  Location: [place];  Service: Neurosurgery;  Laterality: N/A;  exploration lumbar wound. repair of dural defect.  Marland Kitchen MENISECTOMY Right    open medial   . NEPHROLITHOTOMY    . PERIPHERAL VASCULAR CATHETERIZATION N/A 01/30/2015   Procedure: IVC Filter Insertion;  Surgeon: Serafina Mitchell, MD;  Location: [place] [facility];  Service: Cardiovascular;  Laterality: N/A;  . PERIPHERAL VASCULAR CATHETERIZATION N/A 04/24/2015   Procedure: IVC Filter Removal;  Surgeon: Serafina Mitchell, MD;  Location: [place] [facility];  Service: Cardiovascular;  Laterality: N/A;  . PERIPHERAL VASCULAR CATHETERIZATION N/A 08/14/2015   Procedure: IVC Filter Removal;  Surgeon: Serafina Mitchell, MD;  Location: [place] [facility];  Service: Cardiovascular;  Laterality: N/A;  . SHOULDER ARTHROSCOPY WITH ROTATOR CUFF REPAIR AND OPEN BICEPS TENODESIS Left 02/15/2019   Procedure: SHOULDER ARTHROSCOPY WITH open ROTATOR CUFF REPAIR AND OPEN BICEPS TENODESIS;  Surgeon: Carole Civil, MD;  [facility];  Service: Orthopedics;  Laterality: Left;  . SPINE SURGERY  2015   Dr Hal Neer Fusion   . SPINE SURGERY  1999   discectomy  .  TOTAL KNEE ARTHROPLASTY Right 02/02/2015   Procedure:  RIGHT TOTAL KNEE ARTHROPLASTY;  Surgeon: Carole Civil, MD;  [facility];  Service: Orthopedics;  Laterality: Right;  LM with pt's daughter of new arrival time (10:45)    . TOTAL KNEE ARTHROPLASTY Left 07/24/2016   Procedure: TOTAL KNEE ARTHROPLASTY;  Surgeon: Carole Civil, MD;  Location: [facility];  Service: Orthopedics;  Laterality: Left;    There were no vitals filed for this visit.  Subjective Assessment - 04/06/19 0810    Subjective  COVID 19 screening performed on patient upon arrival. Patient reports some tightness upon arrival.    Pertinent History  Bilateral TKA's, DM, RA, spinal surgery.    Patient Stated Goals  Use left UE without  pain.    Currently in Pain?  Yes    Pain Score  3     Pain Location  Shoulder    Pain Orientation  Left    Pain Descriptors / Indicators  Throbbing;Tightness    Pain Type  Surgical pain    Pain Onset  More than a month ago    Pain Frequency  Intermittent         [facility] PT Assessment - 04/06/19 0001      Assessment   Medical Diagnosis  S/p left rotator cuff repair.    Referring Provider (PT)  Arther Abbott MD    Onset Date/Surgical Date  02/15/19    Next MD Visit  04/2019                   [facility] Adult PT Treatment/Exercise - 04/06/19 0001      Exercises   Exercises  Shoulder      Shoulder Exercises: Standing   Other Standing Exercises  Wall ladder x10 reps       Shoulder Exercises: Pulleys   Flexion  5 minutes      Shoulder Exercises: ROM/Strengthening   Ranger  Seated UE ranger into flex, CW circles        Modalities   Modalities  Electrical Stimulation;Vasopneumatic      Electrical Stimulation   Electrical Stimulation Location  L shoulder    Electrical Stimulation Action  IFC    Electrical Stimulation Parameters  80-150 hz x15 min    Electrical Stimulation Goals  Pain      Vasopneumatic   Number Minutes Vasopneumatic   15 minutes    Vasopnuematic Location   Shoulder    Vasopneumatic Pressure  Low    Vasopneumatic Temperature   34      Manual Therapy   Manual Therapy  Passive ROM    Passive ROM  PROM of L shoulder into flex, ER, IR with gentle holds at end range               PT Short Term Goals - 03/09/19 1458      PT SHORT TERM GOAL #1   Title  STG's=LTG's        PT Long Term Goals - 03/16/19 0851      PT LONG TERM GOAL #1   Title  Independent with a HEP.    Time  8    Period  Weeks    Status  Achieved      PT LONG TERM GOAL #2   Title  Active left shoulder flexion to 145-5 degrees so the patient can easily reach overhead.    Time  8    Period  Weeks    Status  On-going  PT LONG TERM GOAL #3   Title  Active ER  to 70 degrees+ to allow for easily donning/doffing of apparel.    Time  8    Period  Weeks    Status  On-going      PT LONG TERM GOAL #4   Title  Increase ROM so patient is able to reach behind back to L3.    Time  8    Period  Weeks    Status  On-going      PT LONG TERM GOAL #5   Title  Increase left shoulder strength to a solid 4+/5 to increase stability for performance of functional activities.    Time  8    Period  Weeks    Status  On-going      PT LONG TERM GOAL #6   Title  Perform ADL's with pain not > 2-3/10.    Time  8    Period  Weeks    Status  On-going            Plan - 04/06/19 0902    Clinical Impression Statement  Patient presented in clinic with tightness of L proximal shoulder. Patient progressed to light AAROM exercises with only minimal complaints of discomfort. Firm end feels and smooth arc of motion noted during PROM session in all directions. Normal modalities response noted following removal of the modalities.    Personal Factors and Comorbidities  Age;Comorbidity 1;Comorbidity 2    Comorbidities  RA, DM.    Examination-Activity Limitations  Dressing    Stability/Clinical Decision Making  Stable/Uncomplicated    Rehab Potential  Excellent    PT Frequency  2x / week    PT Duration  8 weeks    PT Treatment/Interventions  ADLs/Self Care Home Management;Cryotherapy;Electrical Stimulation;Ultrasound;Moist Heat;Therapeutic activities;Therapeutic exercise;Manual techniques;Patient/family education;Passive range of motion;Vasopneumatic Device    PT Next Visit Plan  Begin with left shoulder P and PAROM, vasopneumatic on low with pillow between left elbow and thorax.  Please review HEP.    PT Home Exercise Plan  HEP- pendulums, closed chain flexion stretch, AAROM ER    Consulted and Agree with Plan of Care  Patient       Patient will benefit from skilled therapeutic intervention in order to improve the following deficits and impairments:  Pain, Decreased  activity tolerance, Decreased range of motion  Visit Diagnosis: 1. Acute pain of left shoulder   2. Stiffness of left shoulder, not elsewhere classified        Problem List Patient Active Problem List   Diagnosis Date Noted  . S/P left rotator cuff repair/ biceps tenodesis 02/15/19  03/01/2019  . Labral tear of long head of left biceps tendon   . Partial tear of left subscapularis tendon   . Traumatic complete tear of left rotator cuff   . Family history of malignant neoplasm of prostate 07/28/2018  . History of Graves' disease 11/11/2017  . S/P total knee replacement, right 02/02/15 08/12/2017  . S/P total knee replacement, left 07/24/16 08/12/2017  . Hypercholesterolemia 11/08/2015  . History of DVT (deep vein thrombosis) 11/04/2015  . History of cocaine abuse ([place]) 11/04/2015  . History of heroin abuse ([place]) 11/04/2015  . Obesity 11/04/2015  . Parasomnia 11/04/2015  . Benign paroxysmal positional vertigo 10/29/2015  . Controlled type 2 diabetes mellitus without complication ([place]) 94/17/4081  . Elevated prostate specific antigen (PSA) 10/29/2015  . HX: long term anticoagulant use 10/29/2015  . Sensorineural hearing loss 10/29/2015  . Sleep  apnea with use of continuous positive airway pressure (CPAP) 10/29/2015  . Arthrofibrosis of total knee arthroplasty ([place])   . Pulmonary embolus ([place]) 01/09/2015  . UTI (lower urinary tract infection) 02/21/2014  . Other headache syndrome 02/21/2014  . Nausea with vomiting 02/21/2014  . Rheumatoid arthritis ([place]) 02/21/2014  . Nausea & vomiting 02/21/2014  . Headache(784.0) 02/21/2014  . Lumbar spinal stenosis 02/10/2014    Standley Brooking, PTA 04/06/2019, 9:50 AM  [facility] [address] [place], [place], [address] Phone: [phone]   Fax:  [phone]  Name: Willie Howe MRN: [id number] Date of Birth: [birth year]

## 2019-04-07 ENCOUNTER — Other Ambulatory Visit: Payer: Self-pay | Admitting: Orthopedic Surgery

## 2019-04-07 ENCOUNTER — Telehealth: Payer: Self-pay | Admitting: Orthopedic Surgery

## 2019-04-07 DIAGNOSIS — Z9889 Other specified postprocedural states: Secondary | ICD-10-CM

## 2019-04-07 MED ORDER — HYDROCODONE-ACETAMINOPHEN 10-325 MG PO TABS: 1.0000 | ORAL_TABLET | ORAL | 0 refills | Status: AC | PRN

## 2019-04-07 NOTE — Telephone Encounter (Signed)
I did this one but please tell patients to request thru the pharmacy

## 2019-04-07 NOTE — Telephone Encounter (Signed)
Patient requests refill: HYDROcodone-acetaminophen (NORCO) 10-325 MG tablet 42 tablet 0  -[facility], [employer]

## 2019-04-18 DIAGNOSIS — M0589 Other rheumatoid arthritis with rheumatoid factor of multiple sites: Secondary | ICD-10-CM | POA: Diagnosis not present

## 2019-04-19 ENCOUNTER — Telehealth: Payer: Self-pay | Admitting: Radiology

## 2019-04-19 ENCOUNTER — Other Ambulatory Visit: Payer: Self-pay | Admitting: Orthopedic Surgery

## 2019-04-19 DIAGNOSIS — Z9889 Other specified postprocedural states: Secondary | ICD-10-CM

## 2019-04-19 MED ORDER — HYDROCODONE-ACETAMINOPHEN 7.5-325 MG PO TABS: 1.0000 | ORAL_TABLET | Freq: Four times a day (QID) | ORAL | 0 refills | Status: AC | PRN

## 2019-04-19 NOTE — Progress Notes (Signed)
norco 7.5  

## 2019-04-19 NOTE — Telephone Encounter (Signed)
Pt calls, requests refill of hydrocodone. Not listed on current medications.   [facility]

## 2019-04-20 ENCOUNTER — Other Ambulatory Visit: Payer: Self-pay

## 2019-04-20 ENCOUNTER — Ambulatory Visit: Payer: Medicare HMO | Attending: Orthopedic Surgery | Admitting: Physical Therapy

## 2019-04-20 ENCOUNTER — Encounter: Payer: Self-pay | Admitting: Physical Therapy

## 2019-04-20 DIAGNOSIS — M25612 Stiffness of left shoulder, not elsewhere classified: Secondary | ICD-10-CM | POA: Insufficient documentation

## 2019-04-20 DIAGNOSIS — M25512 Pain in left shoulder: Secondary | ICD-10-CM | POA: Diagnosis not present

## 2019-04-20 NOTE — Therapy (Signed)
[facility] [place], [place], [address] Phone: [phone]   Fax:  [phone]  Physical Therapy Treatment  Patient Details  Name: Willie Howe MRN: [id number] Date of Birth: 1961/07/17 Referring Provider (PT): Arther Abbott MD   Encounter Date: 04/20/2019  PT End of Session - 04/20/19 0827    Visit Number  7    Number of Visits  16    Date for PT Re-Evaluation  06/07/19    Authorization Type  FOTO AT LEAST EVERY 5TH VISIT.  PROGRESS NOTE AT 10TH VISIT.  KX MODIFIER AFTER 15 VISITS.    PT Start Time  0815    PT Stop Time  0858    PT Time Calculation (min)  43 min    Activity Tolerance  Patient tolerated treatment well    Behavior During Therapy  [facility] for tasks assessed/performed       Past Medical History:  Diagnosis Date  . Anginal pain ([place])   . Arthritis   . BPPV (benign paroxysmal positional vertigo)   . DVT (deep venous thrombosis) ([place])   . Dysrhythmia   . Graves disease 1996  . History of kidney stones   . History of pulmonary embolism   . Obesity   . Previous back surgery    x 3  . Rheumatoid arthritis ([place])   . Sleep apnea   . Thyroid disease    Graves Disease    Past Surgical History:  Procedure Laterality Date  . BACK SURGERY    . COLONOSCOPY    . EXAM UNDER ANESTHESIA WITH MANIPULATION OF KNEE Right 04/18/2015   Procedure: MANIPULATION OF RIGHT KNEE UNDER ANESTHESIA;  Surgeon: Carole Civil, MD;  Location: [facility];  Service: Orthopedics;  Laterality: Right;  . IVC Filter     removed December 2016  . [place]  2010  . KNEE ARTHROSCOPY Right 2006   meniscus   . KNEE ARTHROSCOPY WITH MEDIAL MENISECTOMY Left 04/24/2016   Procedure: LEFT KNEE ARTHROSCOPY WITH PARTIAL MEDIAL MENISECTOMY;  Surgeon: Carole Civil, MD;  Location: [facility];  Service: Orthopedics;  Laterality: Left;  . KNEE SURGERY     x 2  . KNEE SURGERY Left 1976   ?ligament repair   . LUMBAR WOUND DEBRIDEMENT  N/A 02/23/2014   Procedure: LUMBAR WOUND DEBRIDEMENT;  Surgeon: Faythe Ghee, MD;  Location: [facility];  Service: Neurosurgery;  Laterality: N/A;  exploration lumbar wound. repair of dural defect.  Marland Kitchen MENISECTOMY Right    open medial   . NEPHROLITHOTOMY    . PERIPHERAL VASCULAR CATHETERIZATION N/A 01/30/2015   Procedure: IVC Filter Insertion;  Surgeon: Serafina Mitchell, MD;  Location: [place] [facility];  Service: Cardiovascular;  Laterality: N/A;  . PERIPHERAL VASCULAR CATHETERIZATION N/A 04/24/2015   Procedure: IVC Filter Removal;  Surgeon: Serafina Mitchell, MD;  Location: [place] [facility];  Service: Cardiovascular;  Laterality: N/A;  . PERIPHERAL VASCULAR CATHETERIZATION N/A 08/14/2015   Procedure: IVC Filter Removal;  Surgeon: Serafina Mitchell, MD;  Location: [place] [facility];  Service: Cardiovascular;  Laterality: N/A;  . SHOULDER ARTHROSCOPY WITH ROTATOR CUFF REPAIR AND OPEN BICEPS TENODESIS Left 02/15/2019   Procedure: SHOULDER ARTHROSCOPY WITH open ROTATOR CUFF REPAIR AND OPEN BICEPS TENODESIS;  Surgeon: Carole Civil, MD;  [facility]: [facility];  Service: Orthopedics;  Laterality: Left;  . SPINE SURGERY  2015   Dr Hal Neer Fusion   . SPINE SURGERY  1999   discectomy  .  TOTAL KNEE ARTHROPLASTY Right 02/02/2015   Procedure:  RIGHT TOTAL KNEE ARTHROPLASTY;  Surgeon: Carole Civil, MD;  [facility];  Service: Orthopedics;  Laterality: Right;  LM with pt's daughter of new arrival time (10:45)    . TOTAL KNEE ARTHROPLASTY Left 07/24/2016   Procedure: TOTAL KNEE ARTHROPLASTY;  Surgeon: Carole Civil, MD;  Location: [facility];  Service: Orthopedics;  Laterality: Left;    There were no vitals filed for this visit.  Subjective Assessment - 04/20/19 0825    Subjective  COVID 19 screening performed on patient upon arrival. Patient reports more soreness in L shoulder now but thinks that he ends up rolling on L shoulder at night.    Pertinent History  Bilateral TKA's, DM, RA, spinal surgery.     Patient Stated Goals  Use left UE without pain.    Currently in Pain?  Yes    Pain Score  5     Pain Location  Shoulder    Pain Orientation  Left    Pain Descriptors / Indicators  Sore    Pain Type  Surgical pain    Pain Onset  More than a month ago    Pain Frequency  Intermittent         [facility] PT Assessment - 04/20/19 0001      Assessment   Medical Diagnosis  S/p left rotator cuff repair.    Referring Provider (PT)  Arther Abbott MD    Onset Date/Surgical Date  02/15/19    Next MD Visit  04/27/2019                   [facility] Adult PT Treatment/Exercise - 04/20/19 0001      Shoulder Exercises: Supine   Protraction  AAROM;Both;20 reps    External Rotation  AAROM;Left;20 reps    Flexion  AAROM;Both;20 reps      Shoulder Exercises: Pulleys   Flexion  5 minutes      Shoulder Exercises: ROM/Strengthening   [place]  Standing UE ranger into flex, CW circles        Modalities   Modalities  Electrical Stimulation;Vasopneumatic      Acupuncturist Location  L shoulder    Electrical Stimulation Action  IFC    Electrical Stimulation Parameters  80-150 hz x10 min    Electrical Stimulation Goals  Pain      Vasopneumatic   Number Minutes Vasopneumatic   10 minutes    Vasopnuematic Location   Shoulder    Vasopneumatic Pressure  Low    Vasopneumatic Temperature   34      Manual Therapy   Manual Therapy  Passive ROM    Passive ROM  PROM of L shoulder into flex, ER, IR with gentle holds at end range               PT Short Term Goals - 03/09/19 1458      PT SHORT TERM GOAL #1   Title  STG's=LTG's        PT Long Term Goals - 03/16/19 0851      PT LONG TERM GOAL #1   Title  Independent with a HEP.    Time  8    Period  Weeks    Status  Achieved      PT LONG TERM GOAL #2   Title  Active left shoulder flexion to 145-5 degrees so the patient can easily reach overhead.    Time  8    Period  Weeks    Status   On-going      PT LONG TERM GOAL #3   Title  Active ER to 70 degrees+ to allow for easily donning/doffing of apparel.    Time  8    Period  Weeks    Status  On-going      PT LONG TERM GOAL #4   Title  Increase ROM so patient is able to reach behind back to L3.    Time  8    Period  Weeks    Status  On-going      PT LONG TERM GOAL #5   Title  Increase left shoulder strength to a solid 4+/5 to increase stability for performance of functional activities.    Time  8    Period  Weeks    Status  On-going      PT LONG TERM GOAL #6   Title  Perform ADL's with pain not > 2-3/10.    Time  8    Period  Weeks    Status  On-going            Plan - 04/20/19 0853    Clinical Impression Statement  Patient tolerated today's treatment well after returning from vacation. More soreness reported in L shoulder compared to pain per patient report. Patient reported intermittant popping sensation felt in L shoulder more durng supine AAROM exercises. Multimodal cueng required to enforce proper technique. Firm end feels and smooth arc of motion noted during PROM of L shoulder in all directions. Normal modalities response noted following removal of the modalities.    Personal Factors and Comorbidities  Age;Comorbidity 1;Comorbidity 2    Comorbidities  RA, DM.    Examination-Activity Limitations  Dressing    Stability/Clinical Decision Making  Stable/Uncomplicated    Rehab Potential  Excellent    PT Frequency  2x / week    PT Duration  8 weeks    PT Treatment/Interventions  ADLs/Self Care Home Management;Cryotherapy;Electrical Stimulation;Ultrasound;Moist Heat;Therapeutic activities;Therapeutic exercise;Manual techniques;Patient/family education;Passive range of motion;Vasopneumatic Device    PT Next Visit Plan  Begin with left shoulder P and PAROM, vasopneumatic on low with pillow between left elbow and thorax.  Please review HEP.    PT Home Exercise Plan  HEP- pendulums, closed chain flexion stretch,  AAROM ER    Consulted and Agree with Plan of Care  Patient       Patient will benefit from skilled therapeutic intervention in order to improve the following deficits and impairments:  Pain, Decreased activity tolerance, Decreased range of motion  Visit Diagnosis: Acute pain of left shoulder  Stiffness of left shoulder, not elsewhere classified     Problem List Patient Active Problem List   Diagnosis Date Noted  . S/P left rotator cuff repair/ biceps tenodesis 02/15/19  03/01/2019  . Labral tear of long head of left biceps tendon   . Partial tear of left subscapularis tendon   . Traumatic complete tear of left rotator cuff   . Family history of malignant neoplasm of prostate 07/28/2018  . History of Graves' disease 11/11/2017  . S/P total knee replacement, right 02/02/15 08/12/2017  . S/P total knee replacement, left 07/24/16 08/12/2017  . Hypercholesterolemia 11/08/2015  . History of DVT (deep vein thrombosis) 11/04/2015  . History of cocaine abuse ([place]) 11/04/2015  . History of heroin abuse ([place]) 11/04/2015  . Obesity 11/04/2015  . Parasomnia 11/04/2015  . Benign paroxysmal positional vertigo 10/29/2015  .  Controlled type 2 diabetes mellitus without complication ([place]) XX123456  . Elevated prostate specific antigen (PSA) 10/29/2015  . HX: long term anticoagulant use 10/29/2015  . Sensorineural hearing loss 10/29/2015  . Sleep apnea with use of continuous positive airway pressure (CPAP) 10/29/2015  . Arthrofibrosis of total knee arthroplasty ([place])   . Pulmonary embolus ([place]) 01/09/2015  . UTI (lower urinary tract infection) 02/21/2014  . Other headache syndrome 02/21/2014  . Nausea with vomiting 02/21/2014  . Rheumatoid arthritis ([place]) 02/21/2014  . Nausea & vomiting 02/21/2014  . Headache(784.0) 02/21/2014  . Lumbar spinal stenosis 02/10/2014    Standley Brooking, PTA 04/20/2019, 9:01 AM  [facility] [place], [place], [address] Phone: [phone]   Fax:  [phone]  Name: Willie Howe MRN: [id number] Date of Birth: February 24, 1961

## 2019-04-27 ENCOUNTER — Other Ambulatory Visit: Payer: Self-pay

## 2019-04-27 ENCOUNTER — Ambulatory Visit: Payer: Medicare HMO | Admitting: Physical Therapy

## 2019-04-27 ENCOUNTER — Encounter: Payer: Self-pay | Admitting: Physical Therapy

## 2019-04-27 ENCOUNTER — Ambulatory Visit (INDEPENDENT_AMBULATORY_CARE_PROVIDER_SITE_OTHER): Payer: Medicare HMO | Admitting: Orthopedic Surgery

## 2019-04-27 ENCOUNTER — Encounter: Payer: Self-pay | Admitting: Orthopedic Surgery

## 2019-04-27 VITALS — BP 158/85 | HR 84 | Temp 96.4°F | Ht 75.0 in | Wt 279.0 lb

## 2019-04-27 DIAGNOSIS — Z9889 Other specified postprocedural states: Secondary | ICD-10-CM

## 2019-04-27 DIAGNOSIS — M25612 Stiffness of left shoulder, not elsewhere classified: Secondary | ICD-10-CM | POA: Diagnosis not present

## 2019-04-27 DIAGNOSIS — M25512 Pain in left shoulder: Secondary | ICD-10-CM | POA: Diagnosis not present

## 2019-04-27 MED ORDER — HYDROCODONE-ACETAMINOPHEN 7.5-325 MG PO TABS: 1.0000 | ORAL_TABLET | Freq: Three times a day (TID) | ORAL | 0 refills | Status: DC | PRN

## 2019-04-27 NOTE — Progress Notes (Signed)
   Post op 02/15/19  Left RCR slightly less than 12 weeks  Willie Howe has soreness and aching in his left shoulder.  He would like to decrease his therapy to twice a week for the next 2 weeks and then try home therapy  He did have some trouble with his hydrocodone as we tried to switch it to 5 mg a did give his much pain relief he would like to go back to 7.5 mg he is taking ibuprofen 3 times a week and had concerned because he was taking methotrexate injections  He has crepitance in his shoulder but his drop arm test is normal and he has no stiffness in the joint with good external rotation active abduction is 90 degrees  Recommend resume hydrocodone 7.5 mg continue ibuprofen daily Therapy for 2 weeks and then home program Follow-up 6 weeks   02/15/2019  10:48 AM  PATIENT:  Willie Howe  58 y.o. male  PRE-OPERATIVE DIAGNOSIS:  left rotator cuff tear acromioclavicular arthritis  POST-OPERATIVE DIAGNOSIS:  left rotator cuff tear acromioclavicular arthritis  PROCEDURE:  Procedure(s): SHOULDER ARTHROSCOPY WITH open ROTATOR CUFF REPAIR AND OPEN BICEPS TENOTOMY (Left)  SWIVEL LOCK ANCHORS ARTHREX X 2   FINDINGS: large rotator cuff tear retracted to past midpoint of humeral head. Supraspinatus and partial Infraspinatus; with extra-articular tear of the biceps tendon  Normal glenohumeral joint  Extensive subacromial bursitis  Medial overgrowth acromion anterior edge  Small degenerative superior edge subscapularis tear                    SURGEON:  Surgeon(s) and Role:    Carole Civil, MD - Primary

## 2019-04-27 NOTE — Therapy (Signed)
[facility] [place], [place], [address] Phone: [phone]   Fax:  [phone]  Physical Therapy Treatment  Patient Details  Name: Willie Howe MRN: [id number] Date of Birth: 01/15/1961 Referring Provider (PT): Arther Abbott MD   Encounter Date: 04/27/2019  PT End of Session - 04/27/19 0818    Visit Number  8    Number of Visits  16    Date for PT Re-Evaluation  06/07/19    Authorization Type  FOTO AT LEAST EVERY 5TH VISIT.  PROGRESS NOTE AT 10TH VISIT.  KX MODIFIER AFTER 15 VISITS.    PT Start Time  [phone]    PT Stop Time  0905    PT Time Calculation (min)  49 min    Activity Tolerance  Patient tolerated treatment well    Behavior During Therapy  [facility] for tasks assessed/performed       Past Medical History:  Diagnosis Date  . Anginal pain ([place])   . Arthritis   . BPPV (benign paroxysmal positional vertigo)   . DVT (deep venous thrombosis) ([place])   . Dysrhythmia   . Graves disease 1996  . History of kidney stones   . History of pulmonary embolism   . Obesity   . Previous back surgery    x 3  . Rheumatoid arthritis ([place])   . Sleep apnea   . Thyroid disease    Graves Disease    Past Surgical History:  Procedure Laterality Date  . BACK SURGERY    . COLONOSCOPY    . EXAM UNDER ANESTHESIA WITH MANIPULATION OF KNEE Right 04/18/2015   Procedure: MANIPULATION OF RIGHT KNEE UNDER ANESTHESIA;  Surgeon: Carole Civil, MD;  Location: [facility];  Service: Orthopedics;  Laterality: Right;  . IVC Filter     removed December 2016  . [place]  2010  . KNEE ARTHROSCOPY Right 2006   meniscus   . KNEE ARTHROSCOPY WITH MEDIAL MENISECTOMY Left 04/24/2016   Procedure: LEFT KNEE ARTHROSCOPY WITH PARTIAL MEDIAL MENISECTOMY;  Surgeon: Carole Civil, MD;  Location: [facility];  Service: Orthopedics;  Laterality: Left;  . KNEE SURGERY     x 2  . KNEE SURGERY Left 1976   ?ligament repair   . LUMBAR WOUND DEBRIDEMENT  N/A 02/23/2014   Procedure: LUMBAR WOUND DEBRIDEMENT;  Surgeon: Faythe Ghee, MD;  Location: [place];  Service: Neurosurgery;  Laterality: N/A;  exploration lumbar wound. repair of dural defect.  Marland Kitchen MENISECTOMY Right    open medial   . NEPHROLITHOTOMY    . PERIPHERAL VASCULAR CATHETERIZATION N/A 01/30/2015   Procedure: IVC Filter Insertion;  Surgeon: Serafina Mitchell, MD;  Location: [place] [facility];  Service: Cardiovascular;  Laterality: N/A;  . PERIPHERAL VASCULAR CATHETERIZATION N/A 04/24/2015   Procedure: IVC Filter Removal;  Surgeon: Serafina Mitchell, MD;  Location: [place] [facility];  Service: Cardiovascular;  Laterality: N/A;  . PERIPHERAL VASCULAR CATHETERIZATION N/A 08/14/2015   Procedure: IVC Filter Removal;  Surgeon: Serafina Mitchell, MD;  Location: [place] [facility];  Service: Cardiovascular;  Laterality: N/A;  . SHOULDER ARTHROSCOPY WITH ROTATOR CUFF REPAIR AND OPEN BICEPS TENODESIS Left 02/15/2019   Procedure: SHOULDER ARTHROSCOPY WITH open ROTATOR CUFF REPAIR AND OPEN BICEPS TENODESIS;  Surgeon: Carole Civil, MD;  [facility]: [facility];  Service: Orthopedics;  Laterality: Left;  . SPINE SURGERY  2015   Dr Hal Neer Fusion   . SPINE SURGERY  1999   discectomy  .  TOTAL KNEE ARTHROPLASTY Right 02/02/2015   Procedure:  RIGHT TOTAL KNEE ARTHROPLASTY;  Surgeon: Carole Civil, MD;  [facility]: [facility];  Service: Orthopedics;  Laterality: Right;  LM with pt's daughter of new arrival time (10:45)    . TOTAL KNEE ARTHROPLASTY Left 07/24/2016   Procedure: TOTAL KNEE ARTHROPLASTY;  Surgeon: Carole Civil, MD;  Location: [facility];  Service: Orthopedics;  Laterality: Left;    There were no vitals filed for this visit.  Subjective Assessment - 04/27/19 0818    Subjective  COVID 19 screening performed on patient upon arrival. Patient reports some ache in the shoulder. Reports nagging pain overnight but goes to see Dr. Aline Brochure this morning. Reports trying to pick up weedeater the other day but  unable to.    Pertinent History  Bilateral TKA's, DM, RA, spinal surgery.    Patient Stated Goals  Use left UE without pain.    Currently in Pain?  Yes    Pain Score  3     Pain Location  Shoulder    Pain Orientation  Left    Pain Descriptors / Indicators  Aching    Pain Type  Surgical pain    Pain Onset  More than a month ago    Pain Frequency  Constant         [facility] PT Assessment - 04/27/19 0001      Assessment   Medical Diagnosis  S/p left rotator cuff repair.    Referring Provider (PT)  Arther Abbott MD    Onset Date/Surgical Date  02/15/19    Next MD Visit  04/27/2019      ROM / Strength   AROM / PROM / Strength  AROM      AROM   Overall AROM   Deficits    AROM Assessment Site  Shoulder    Right/Left Shoulder  Left    Left Shoulder Flexion  120 Degrees    Left Shoulder Internal Rotation  60 Degrees    Left Shoulder External Rotation  55 Degrees                   [facility] Adult PT Treatment/Exercise - 04/27/19 0001      Shoulder Exercises: Supine   Protraction  AAROM;Both;20 reps    External Rotation  AAROM;Left;20 reps    Flexion  AAROM;Both;20 reps      Shoulder Exercises: Pulleys   Flexion  5 minutes      Shoulder Exercises: ROM/Strengthening   Wall Wash  flex x15 reps, CW and CCW x5 reps each      Modalities   Modalities  Electrical Stimulation;Vasopneumatic      Electrical Stimulation   Electrical Stimulation Location  L shoulder    Electrical Stimulation Action  IFC    Electrical Stimulation Parameters  80-150 hz x15 min    Electrical Stimulation Goals  Pain      Vasopneumatic   Number Minutes Vasopneumatic   15 minutes    Vasopnuematic Location   Shoulder    Vasopneumatic Pressure  Low    Vasopneumatic Temperature   34      Manual Therapy   Manual Therapy  Passive ROM    Passive ROM  PROM of L shoulder into flex, ER, IR with gentle holds at end range               PT Short Term Goals - 03/09/19 1458      PT SHORT TERM  GOAL #1  Title  STG's=LTG's        PT Long Term Goals - 03/16/19 0851      PT LONG TERM GOAL #1   Title  Independent with a HEP.    Time  8    Period  Weeks    Status  Achieved      PT LONG TERM GOAL #2   Title  Active left shoulder flexion to 145-5 degrees so the patient can easily reach overhead.    Time  8    Period  Weeks    Status  On-going      PT LONG TERM GOAL #3   Title  Active ER to 70 degrees+ to allow for easily donning/doffing of apparel.    Time  8    Period  Weeks    Status  On-going      PT LONG TERM GOAL #4   Title  Increase ROM so patient is able to reach behind back to L3.    Time  8    Period  Weeks    Status  On-going      PT LONG TERM GOAL #5   Title  Increase left shoulder strength to a solid 4+/5 to increase stability for performance of functional activities.    Time  8    Period  Weeks    Status  On-going      PT LONG TERM GOAL #6   Title  Perform ADL's with pain not > 2-3/10.    Time  8    Period  Weeks    Status  On-going            Plan - 04/27/19 0851    Clinical Impression Statement  Patient arrived in clinic with reports of discomfort and aching in L shoulder. Patient reports being more active in daily life and attempted to use weedeater but unable to use it. Patient educated to not lift anything like that too heavy to avoid reinjury. Patient limited with wall wash due to fatigue and discomfort. Firm end feels and smooth arc of motion noted during PROM of L shoulder. PROM of L shoulder measurements provided in today's note. Normal modalities response noted following removal of the modalities.    Personal Factors and Comorbidities  Age;Comorbidity 1;Comorbidity 2    Comorbidities  RA, DM.    Examination-Activity Limitations  Dressing    Stability/Clinical Decision Making  Stable/Uncomplicated    Rehab Potential  Excellent    PT Frequency  2x / week    PT Duration  8 weeks    PT Treatment/Interventions  ADLs/Self Care Home  Management;Cryotherapy;Electrical Stimulation;Ultrasound;Moist Heat;Therapeutic activities;Therapeutic exercise;Manual techniques;Patient/family education;Passive range of motion;Vasopneumatic Device    PT Next Visit Plan  Begin with left shoulder P and PAROM, vasopneumatic on low with pillow between left elbow and thorax.  Please review HEP.    PT Home Exercise Plan  HEP- pendulums, closed chain flexion stretch, AAROM ER    Consulted and Agree with Plan of Care  Patient       Patient will benefit from skilled therapeutic intervention in order to improve the following deficits and impairments:  Pain, Decreased activity tolerance, Decreased range of motion  Visit Diagnosis: Acute pain of left shoulder  Stiffness of left shoulder, not elsewhere classified     Problem List Patient Active Problem List   Diagnosis Date Noted  . S/P left rotator cuff repair/ biceps tenodesis 02/15/19  03/01/2019  . Labral tear of long head of left  biceps tendon   . Partial tear of left subscapularis tendon   . Traumatic complete tear of left rotator cuff   . Family history of malignant neoplasm of prostate 07/28/2018  . History of Graves' disease 11/11/2017  . S/P total knee replacement, right 02/02/15 08/12/2017  . S/P total knee replacement, left 07/24/16 08/12/2017  . Hypercholesterolemia 11/08/2015  . History of DVT (deep vein thrombosis) 11/04/2015  . History of cocaine abuse ([place]) 11/04/2015  . History of heroin abuse ([place]) 11/04/2015  . Obesity 11/04/2015  . Parasomnia 11/04/2015  . Benign paroxysmal positional vertigo 10/29/2015  . Controlled type 2 diabetes mellitus without complication ([place]) XX123456  . Elevated prostate specific antigen (PSA) 10/29/2015  . HX: long term anticoagulant use 10/29/2015  . Sensorineural hearing loss 10/29/2015  . Sleep apnea with use of continuous positive airway pressure (CPAP) 10/29/2015  . Arthrofibrosis of total knee arthroplasty ([place])   . Pulmonary  embolus ([place]) 01/09/2015  . UTI (lower urinary tract infection) 02/21/2014  . Other headache syndrome 02/21/2014  . Nausea with vomiting 02/21/2014  . Rheumatoid arthritis ([place]) 02/21/2014  . Nausea & vomiting 02/21/2014  . Headache(784.0) 02/21/2014  . Lumbar spinal stenosis 02/10/2014   Standley Brooking, PTA 04/27/19 9:11 AM   [facility] [place], [place], [address] Phone: [phone]   Fax:  [phone]  Name: YOUSAF HANSARD MRN: [id number] Date of Birth: 09-05-1960

## 2019-05-04 ENCOUNTER — Ambulatory Visit: Payer: Medicare HMO | Admitting: Physical Therapy

## 2019-05-04 ENCOUNTER — Encounter: Payer: Self-pay | Admitting: Physical Therapy

## 2019-05-04 ENCOUNTER — Other Ambulatory Visit: Payer: Self-pay

## 2019-05-04 DIAGNOSIS — M25612 Stiffness of left shoulder, not elsewhere classified: Secondary | ICD-10-CM

## 2019-05-04 DIAGNOSIS — M25512 Pain in left shoulder: Secondary | ICD-10-CM | POA: Diagnosis not present

## 2019-05-04 NOTE — Therapy (Signed)
[facility] [place], [place], [address] Phone: [phone]   Fax:  [phone]  Physical Therapy Treatment  Patient Details  Name: Willie Howe MRN: [id number] Date of Birth: 1960/12/24 Referring Provider (PT): Arther Abbott MD   Encounter Date: 05/04/2019  PT End of Session - 05/04/19 0818    Visit Number  9    Number of Visits  16    Date for PT Re-Evaluation  06/07/19    Authorization Type  FOTO AT LEAST EVERY 5TH VISIT.  PROGRESS NOTE AT 10TH VISIT.  KX MODIFIER AFTER 15 VISITS.    PT Start Time  0815    PT Stop Time  0902    PT Time Calculation (min)  47 min    Activity Tolerance  Patient tolerated treatment well    Behavior During Therapy  [facility] for tasks assessed/performed       Past Medical History:  Diagnosis Date  . Anginal pain ([place])   . Arthritis   . BPPV (benign paroxysmal positional vertigo)   . DVT (deep venous thrombosis) ([place])   . Dysrhythmia   . Graves disease 1996  . History of kidney stones   . History of pulmonary embolism   . Obesity   . Previous back surgery    x 3  . Rheumatoid arthritis ([place])   . Sleep apnea   . Thyroid disease    Graves Disease    Past Surgical History:  Procedure Laterality Date  . BACK SURGERY    . COLONOSCOPY    . EXAM UNDER ANESTHESIA WITH MANIPULATION OF KNEE Right 04/18/2015   Procedure: MANIPULATION OF RIGHT KNEE UNDER ANESTHESIA;  Surgeon: Carole Civil, MD;  Location: [facility];  Service: Orthopedics;  Laterality: Right;  . IVC Filter     removed December 2016  . [place]  2010  . KNEE ARTHROSCOPY Right 2006   meniscus   . KNEE ARTHROSCOPY WITH MEDIAL MENISECTOMY Left 04/24/2016   Procedure: LEFT KNEE ARTHROSCOPY WITH PARTIAL MEDIAL MENISECTOMY;  Surgeon: Carole Civil, MD;  [facility]: [facility];  Service: Orthopedics;  Laterality: Left;  . KNEE SURGERY     x 2  . KNEE SURGERY Left 1976   ?ligament repair   . LUMBAR WOUND  DEBRIDEMENT N/A 02/23/2014   Procedure: LUMBAR WOUND DEBRIDEMENT;  Surgeon: Faythe Ghee, MD;  Location: [facility];  Service: Neurosurgery;  Laterality: N/A;  exploration lumbar wound. repair of dural defect.  Marland Kitchen MENISECTOMY Right    open medial   . NEPHROLITHOTOMY    . PERIPHERAL VASCULAR CATHETERIZATION N/A 01/30/2015   Procedure: IVC Filter Insertion;  Surgeon: Serafina Mitchell, MD;  Location: [place] [facility];  Service: Cardiovascular;  Laterality: N/A;  . PERIPHERAL VASCULAR CATHETERIZATION N/A 04/24/2015   Procedure: IVC Filter Removal;  Surgeon: Serafina Mitchell, MD;  Location: [place] [facility];  Service: Cardiovascular;  Laterality: N/A;  . PERIPHERAL VASCULAR CATHETERIZATION N/A 08/14/2015   Procedure: IVC Filter Removal;  Surgeon: Serafina Mitchell, MD;  Location: [place] [facility];  Service: Cardiovascular;  Laterality: N/A;  . SHOULDER ARTHROSCOPY WITH ROTATOR CUFF REPAIR AND OPEN BICEPS TENODESIS Left 02/15/2019   Procedure: SHOULDER ARTHROSCOPY WITH open ROTATOR CUFF REPAIR AND OPEN BICEPS TENODESIS;  Surgeon: Carole Civil, MD;  [facility];  Service: Orthopedics;  Laterality: Left;  . SPINE SURGERY  2015   Dr Hal Neer Fusion   . SPINE SURGERY  1999   discectomy  .  TOTAL KNEE ARTHROPLASTY Right 02/02/2015   Procedure:  RIGHT TOTAL KNEE ARTHROPLASTY;  Surgeon: Carole Civil, MD;  [facility];  Service: Orthopedics;  Laterality: Right;  LM with pt's daughter of new arrival time (10:45)    . TOTAL KNEE ARTHROPLASTY Left 07/24/2016   Procedure: TOTAL KNEE ARTHROPLASTY;  Surgeon: Carole Civil, MD;  [facility];  Service: Orthopedics;  Laterality: Left;    There were no vitals filed for this visit.  Subjective Assessment - 05/04/19 0814    Subjective  COVID 19 screening performed on patient upon arrival. Patient reports aching this morning.    Pertinent History  Bilateral TKA's, DM, RA, spinal surgery.    Patient Stated Goals  Use left UE without pain.     Currently in Pain?  Yes    Pain Score  4     Pain Location  Shoulder    Pain Orientation  Left    Pain Descriptors / Indicators  Aching    Pain Type  Surgical pain    Pain Onset  More than a month ago    Pain Frequency  Intermittent         [facility] PT Assessment - 05/04/19 0001      Assessment   Medical Diagnosis  S/p left rotator cuff repair.    Referring Provider (PT)  Arther Abbott MD    Onset Date/Surgical Date  02/15/19                   [facility] Adult PT Treatment/Exercise - 05/04/19 0001      Shoulder Exercises: Pulleys   Flexion  5 minutes      Shoulder Exercises: ROM/Strengthening   UBE (Upper Arm Bike)  120 RPM x6 min    Ranger  UE ranger in standing into flexion    Other ROM/Strengthening Exercises  Wall ladder to max x10 reps      Modalities   Modalities  Electrical Stimulation;Vasopneumatic      Electrical Stimulation   Electrical Stimulation Location  L shoulder    Electrical Stimulation Action  IFC    Electrical Stimulation Parameters  80-150 hz x15 min    Electrical Stimulation Goals  Pain      Vasopneumatic   Number Minutes Vasopneumatic   15 minutes    Vasopnuematic Location   Shoulder    Vasopneumatic Pressure  Low    Vasopneumatic Temperature   34      Manual Therapy   Manual Therapy  Passive ROM    Passive ROM  PROM of L shoulder into flex, ER, IR with gentle holds at end range               PT Short Term Goals - 03/09/19 1458      PT SHORT TERM GOAL #1   Title  STG's=LTG's        PT Long Term Goals - 03/16/19 0851      PT LONG TERM GOAL #1   Title  Independent with a HEP.    Time  8    Period  Weeks    Status  Achieved      PT LONG TERM GOAL #2   Title  Active left shoulder flexion to 145-5 degrees so the patient can easily reach overhead.    Time  8    Period  Weeks    Status  On-going      PT LONG TERM GOAL #3   Title  Active ER to  70 degrees+ to allow for easily donning/doffing of apparel.    Time  8     Period  Weeks    Status  On-going      PT LONG TERM GOAL #4   Title  Increase ROM so patient is able to reach behind back to L3.    Time  8    Period  Weeks    Status  On-going      PT LONG TERM GOAL #5   Title  Increase left shoulder strength to a solid 4+/5 to increase stability for performance of functional activities.    Time  8    Period  Weeks    Status  On-going      PT LONG TERM GOAL #6   Title  Perform ADL's with pain not > 2-3/10.    Time  8    Period  Weeks    Status  On-going            Plan - 05/04/19 0850    Clinical Impression Statement  Patient guided through more AAROM exercises after arriving in clinic with reports of aching of L shoulder. Patient had no complaints of any increased pain during therex session. Firm end feels and smooth arc of motion noted during PROM of L shoulder in all directions. Normal modalities response noted following removal of the modalities.    Personal Factors and Comorbidities  Age;Comorbidity 1;Comorbidity 2    Comorbidities  RA, DM.    Examination-Activity Limitations  Dressing    Stability/Clinical Decision Making  Stable/Uncomplicated    Rehab Potential  Excellent    PT Frequency  2x / week    PT Duration  8 weeks    PT Treatment/Interventions  ADLs/Self Care Home Management;Cryotherapy;Electrical Stimulation;Ultrasound;Moist Heat;Therapeutic activities;Therapeutic exercise;Manual techniques;Patient/family education;Passive range of motion;Vasopneumatic Device    PT Next Visit Plan  Continue with AAROM/AROM progression per protocol.    PT Home Exercise Plan  HEP- pendulums, closed chain flexion stretch, AAROM ER    Consulted and Agree with Plan of Care  Patient       Patient will benefit from skilled therapeutic intervention in order to improve the following deficits and impairments:  Pain, Decreased activity tolerance, Decreased range of motion  Visit Diagnosis: Acute pain of left shoulder  Stiffness of left  shoulder, not elsewhere classified     Problem List Patient Active Problem List   Diagnosis Date Noted  . S/P left rotator cuff repair/ biceps tenodesis 02/15/19  03/01/2019  . Labral tear of long head of left biceps tendon   . Partial tear of left subscapularis tendon   . Traumatic complete tear of left rotator cuff   . Family history of malignant neoplasm of prostate 07/28/2018  . History of Graves' disease 11/11/2017  . S/P total knee replacement, right 02/02/15 08/12/2017  . S/P total knee replacement, left 07/24/16 08/12/2017  . Hypercholesterolemia 11/08/2015  . History of DVT (deep vein thrombosis) 11/04/2015  . History of cocaine abuse ([place]) 11/04/2015  . History of heroin abuse ([place]) 11/04/2015  . Obesity 11/04/2015  . Parasomnia 11/04/2015  . Benign paroxysmal positional vertigo 10/29/2015  . Controlled type 2 diabetes mellitus without complication ([place]) XX123456  . Elevated prostate specific antigen (PSA) 10/29/2015  . HX: long term anticoagulant use 10/29/2015  . Sensorineural hearing loss 10/29/2015  . Sleep apnea with use of continuous positive airway pressure (CPAP) 10/29/2015  . Arthrofibrosis of total knee arthroplasty ([place])   . Pulmonary embolus ([place]) 01/09/2015  .  UTI (lower urinary tract infection) 02/21/2014  . Other headache syndrome 02/21/2014  . Nausea with vomiting 02/21/2014  . Rheumatoid arthritis ([place]) 02/21/2014  . Nausea & vomiting 02/21/2014  . Headache(784.0) 02/21/2014  . Lumbar spinal stenosis 02/10/2014    Standley Brooking, PTA 05/04/2019, 9:12 AM  [facility] [place], [place], [address] Phone: [phone]   Fax:  [phone]  Name: Willie Howe MRN: [id number] Date of Birth: 03-Jul-1961

## 2019-05-09 DIAGNOSIS — M150 Primary generalized (osteo)arthritis: Secondary | ICD-10-CM | POA: Diagnosis not present

## 2019-05-09 DIAGNOSIS — M2550 Pain in unspecified joint: Secondary | ICD-10-CM | POA: Diagnosis not present

## 2019-05-09 DIAGNOSIS — Z6837 Body mass index (BMI) 37.0-37.9, adult: Secondary | ICD-10-CM | POA: Diagnosis not present

## 2019-05-09 DIAGNOSIS — M0589 Other rheumatoid arthritis with rheumatoid factor of multiple sites: Secondary | ICD-10-CM | POA: Diagnosis not present

## 2019-05-09 DIAGNOSIS — E663 Overweight: Secondary | ICD-10-CM | POA: Diagnosis not present

## 2019-05-09 DIAGNOSIS — Z79899 Other long term (current) drug therapy: Secondary | ICD-10-CM | POA: Diagnosis not present

## 2019-05-10 ENCOUNTER — Other Ambulatory Visit: Payer: Self-pay

## 2019-05-10 DIAGNOSIS — Z9889 Other specified postprocedural states: Secondary | ICD-10-CM

## 2019-05-11 ENCOUNTER — Encounter: Payer: Self-pay | Admitting: Physical Therapy

## 2019-05-11 ENCOUNTER — Ambulatory Visit: Payer: Medicare HMO | Admitting: Physical Therapy

## 2019-05-11 ENCOUNTER — Other Ambulatory Visit: Payer: Self-pay

## 2019-05-11 DIAGNOSIS — M25612 Stiffness of left shoulder, not elsewhere classified: Secondary | ICD-10-CM | POA: Diagnosis not present

## 2019-05-11 DIAGNOSIS — M25512 Pain in left shoulder: Secondary | ICD-10-CM

## 2019-05-11 MED ORDER — HYDROCODONE-ACETAMINOPHEN 7.5-325 MG PO TABS: 1.0000 | ORAL_TABLET | Freq: Three times a day (TID) | ORAL | 0 refills | Status: DC | PRN

## 2019-05-11 NOTE — Therapy (Signed)
[facility] [place], [place], [address] Phone: [phone]   Fax:  [phone]  Physical Therapy Treatment  Patient Details  Name: Willie Howe MRN: [id number] Date of Birth: 05/29/1961 Referring Provider (PT): Arther Abbott MD   Encounter Date: 05/11/2019  PT End of Session - 05/11/19 0817    Visit Number  10    Number of Visits  16    Date for PT Re-Evaluation  06/07/19    Authorization Type  FOTO AT LEAST EVERY 5TH VISIT.  PROGRESS NOTE AT 10TH VISIT.  KX MODIFIER AFTER 15 VISITS.    PT Start Time  [phone]    PT Stop Time  0910    PT Time Calculation (min)  54 min    Activity Tolerance  Patient tolerated treatment well    Behavior During Therapy  [facility] for tasks assessed/performed       Past Medical History:  Diagnosis Date  . Anginal pain ([place])   . Arthritis   . BPPV (benign paroxysmal positional vertigo)   . DVT (deep venous thrombosis) ([place])   . Dysrhythmia   . Graves disease 1996  . History of kidney stones   . History of pulmonary embolism   . Obesity   . Previous back surgery    x 3  . Rheumatoid arthritis ([place])   . Sleep apnea   . Thyroid disease    Graves Disease    Past Surgical History:  Procedure Laterality Date  . BACK SURGERY    . COLONOSCOPY    . EXAM UNDER ANESTHESIA WITH MANIPULATION OF KNEE Right 04/18/2015   Procedure: MANIPULATION OF RIGHT KNEE UNDER ANESTHESIA;  Surgeon: Carole Civil, MD;  Location: [facility];  Service: Orthopedics;  Laterality: Right;  . IVC Filter     removed December 2016  . [place]  2010  . KNEE ARTHROSCOPY Right 2006   meniscus   . KNEE ARTHROSCOPY WITH MEDIAL MENISECTOMY Left 04/24/2016   Procedure: LEFT KNEE ARTHROSCOPY WITH PARTIAL MEDIAL MENISECTOMY;  Surgeon: Carole Civil, MD;  Location: [facility];  Service: Orthopedics;  Laterality: Left;  . KNEE SURGERY     x 2  . KNEE SURGERY Left 1976   ?ligament repair   . LUMBAR WOUND  DEBRIDEMENT N/A 02/23/2014   Procedure: LUMBAR WOUND DEBRIDEMENT;  Surgeon: Faythe Ghee, MD;  Location: [facility];  Service: Neurosurgery;  Laterality: N/A;  exploration lumbar wound. repair of dural defect.  Marland Kitchen MENISECTOMY Right    open medial   . NEPHROLITHOTOMY    . PERIPHERAL VASCULAR CATHETERIZATION N/A 01/30/2015   Procedure: IVC Filter Insertion;  Surgeon: Serafina Mitchell, MD;  Location: [place] [facility];  Service: Cardiovascular;  Laterality: N/A;  . PERIPHERAL VASCULAR CATHETERIZATION N/A 04/24/2015   Procedure: IVC Filter Removal;  Surgeon: Serafina Mitchell, MD;  Location: [place] [facility];  Service: Cardiovascular;  Laterality: N/A;  . PERIPHERAL VASCULAR CATHETERIZATION N/A 08/14/2015   Procedure: IVC Filter Removal;  Surgeon: Serafina Mitchell, MD;  Location: [place] [facility];  Service: Cardiovascular;  Laterality: N/A;  . SHOULDER ARTHROSCOPY WITH ROTATOR CUFF REPAIR AND OPEN BICEPS TENODESIS Left 02/15/2019   Procedure: SHOULDER ARTHROSCOPY WITH open ROTATOR CUFF REPAIR AND OPEN BICEPS TENODESIS;  Surgeon: Carole Civil, MD;  [facility]: [facility];  Service: Orthopedics;  Laterality: Left;  . SPINE SURGERY  2015   Dr Hal Neer Fusion   . SPINE SURGERY  1999   discectomy  .  TOTAL KNEE ARTHROPLASTY Right 02/02/2015   Procedure:  RIGHT TOTAL KNEE ARTHROPLASTY;  Surgeon: Carole Civil, MD;  [facility]: [facility];  Service: Orthopedics;  Laterality: Right;  LM with pt's daughter of new arrival time (10:45)    . TOTAL KNEE ARTHROPLASTY Left 07/24/2016   Procedure: TOTAL KNEE ARTHROPLASTY;  Surgeon: Carole Civil, MD;  Location: [facility];  Service: Orthopedics;  Laterality: Left;    There were no vitals filed for this visit.  Subjective Assessment - 05/11/19 0816    Subjective  COVID 19 screening performed on patient upon arrival. Patient reports some aching this morning.    Pertinent History  Bilateral TKA's, DM, RA, spinal surgery.    Patient Stated Goals  Use left UE without pain.     Currently in Pain?  Yes    Pain Score  4     Pain Location  Shoulder    Pain Orientation  Left    Pain Descriptors / Indicators  Aching    Pain Type  Surgical pain    Pain Onset  More than a month ago    Pain Frequency  Intermittent         [facility] PT Assessment - 05/11/19 0001      Assessment   Medical Diagnosis  S/p left rotator cuff repair.    Referring Provider (PT)  Arther Abbott MD    Onset Date/Surgical Date  02/15/19    Next MD Visit  05/2019      Observation/Other Assessments   Focus on Therapeutic Outcomes (FOTO)   49%, CK      ROM / Strength   AROM / PROM / Strength  AROM      AROM   Overall AROM   Deficits    AROM Assessment Site  Shoulder    Right/Left Shoulder  Left    Left Shoulder Flexion  123 Degrees    Left Shoulder Internal Rotation  63 Degrees    Left Shoulder External Rotation  60 Degrees                   [facility] Adult PT Treatment/Exercise - 05/11/19 0001      Shoulder Exercises: Pulleys   Flexion  5 minutes      Shoulder Exercises: ROM/Strengthening   UBE (Upper Arm Bike)  90 RPM x8 min    Ranger  UE ranger in standing into flexion x15 reps, CCW circles x15 reps      Shoulder Exercises: Stretch   Internal Rotation Stretch  3 reps    Internal Rotation Stretch Limitations  20 sec holds      Modalities   Modalities  Psychologist, educational Location  L shoulder    Electrical Stimulation Action  IFC    Electrical Stimulation Parameters  80-150 hz x15 min    Electrical Stimulation Goals  Pain      Vasopneumatic   Number Minutes Vasopneumatic   15 minutes    Vasopnuematic Location   Shoulder    Vasopneumatic Pressure  Low    Vasopneumatic Temperature   34      Manual Therapy   Manual Therapy  Passive ROM    Passive ROM  PROM of L shoulder into flex, ER, IR with gentle holds at end range               PT Short Term Goals - 03/09/19 1458      PT  SHORT TERM GOAL #1   Title  STG's=LTG's        PT Long Term Goals - 03/16/19 0851      PT LONG TERM GOAL #1   Title  Independent with a HEP.    Time  8    Period  Weeks    Status  Achieved      PT LONG TERM GOAL #2   Title  Active left shoulder flexion to 145-5 degrees so the patient can easily reach overhead.    Time  8    Period  Weeks    Status  On-going      PT LONG TERM GOAL #3   Title  Active ER to 70 degrees+ to allow for easily donning/doffing of apparel.    Time  8    Period  Weeks    Status  On-going      PT LONG TERM GOAL #4   Title  Increase ROM so patient is able to reach behind back to L3.    Time  8    Period  Weeks    Status  On-going      PT LONG TERM GOAL #5   Title  Increase left shoulder strength to a solid 4+/5 to increase stability for performance of functional activities.    Time  8    Period  Weeks    Status  On-going      PT LONG TERM GOAL #6   Title  Perform ADL's with pain not > 2-3/10.    Time  8    Period  Weeks    Status  On-going            Plan - 05/11/19 0901    Clinical Impression Statement  Patient presented in clinic with aching in L shoulder. Patient able to complete AAROM therex but limited more with end range flexion. Patient guided through IR stretching per report of tightness of L shoulder when reaching behind him. Firm end feels and smooth arc of motion noted during PROM of L shoulder session. AROM measurements provided in today's note. Normal modalities response noted following removal of the modalities. 49% 10th visit limitation on FOTO.    Personal Factors and Comorbidities  Age;Comorbidity 1;Comorbidity 2    Comorbidities  RA, DM.    Examination-Activity Limitations  Dressing    Stability/Clinical Decision Making  Stable/Uncomplicated    Rehab Potential  Excellent    PT Frequency  2x / week    PT Duration  8 weeks    PT Treatment/Interventions  ADLs/Self Care Home Management;Cryotherapy;Electrical  Stimulation;Ultrasound;Moist Heat;Therapeutic activities;Therapeutic exercise;Manual techniques;Patient/family education;Passive range of motion;Vasopneumatic Device    PT Next Visit Plan  Continue with AAROM/AROM progression per protocol.    PT Home Exercise Plan  HEP- pendulums, closed chain flexion stretch, AAROM ER    Consulted and Agree with Plan of Care  Patient       Patient will benefit from skilled therapeutic intervention in order to improve the following deficits and impairments:  Pain, Decreased activity tolerance, Decreased range of motion  Visit Diagnosis: Acute pain of left shoulder  Stiffness of left shoulder, not elsewhere classified     Problem List Patient Active Problem List   Diagnosis Date Noted  . S/P left rotator cuff repair/ biceps tenodesis 02/15/19  03/01/2019  . Labral tear of long head of left biceps tendon   . Partial tear of left subscapularis tendon   . Traumatic complete tear of left rotator cuff   .  Family history of malignant neoplasm of prostate 07/28/2018  . History of Graves' disease 11/11/2017  . S/P total knee replacement, right 02/02/15 08/12/2017  . S/P total knee replacement, left 07/24/16 08/12/2017  . Hypercholesterolemia 11/08/2015  . History of DVT (deep vein thrombosis) 11/04/2015  . History of cocaine abuse ([place]) 11/04/2015  . History of heroin abuse ([place]) 11/04/2015  . Obesity 11/04/2015  . Parasomnia 11/04/2015  . Benign paroxysmal positional vertigo 10/29/2015  . Controlled type 2 diabetes mellitus without complication ([place]) XX123456  . Elevated prostate specific antigen (PSA) 10/29/2015  . HX: long term anticoagulant use 10/29/2015  . Sensorineural hearing loss 10/29/2015  . Sleep apnea with use of continuous positive airway pressure (CPAP) 10/29/2015  . Arthrofibrosis of total knee arthroplasty ([place])   . Pulmonary embolus ([place]) 01/09/2015  . UTI (lower urinary tract infection) 02/21/2014  . Other headache syndrome  02/21/2014  . Nausea with vomiting 02/21/2014  . Rheumatoid arthritis ([place]) 02/21/2014  . Nausea & vomiting 02/21/2014  . Headache(784.0) 02/21/2014  . Lumbar spinal stenosis 02/10/2014    Standley Brooking, PTA 05/11/2019, 9:13 AM  [facility] [address] [place], [place], [address] Phone: [phone]   Fax:  [phone]  Name: Willie Howe MRN: [id number] Date of Birth: 10/08/1960

## 2019-05-18 ENCOUNTER — Other Ambulatory Visit: Payer: Self-pay

## 2019-05-18 ENCOUNTER — Encounter: Payer: Self-pay | Admitting: Physical Therapy

## 2019-05-18 ENCOUNTER — Ambulatory Visit: Payer: Medicare HMO | Admitting: Physical Therapy

## 2019-05-18 DIAGNOSIS — M25612 Stiffness of left shoulder, not elsewhere classified: Secondary | ICD-10-CM | POA: Diagnosis not present

## 2019-05-18 DIAGNOSIS — M25512 Pain in left shoulder: Secondary | ICD-10-CM

## 2019-05-18 NOTE — Therapy (Signed)
[facility] [place], [place], [address] Phone: [phone]   Fax:  [phone]  Physical Therapy Treatment  Patient Details  Name: Willie Howe MRN: [id number] Date of Birth: 07/31/1961 Referring Provider (PT): Arther Abbott MD   Encounter Date: 05/18/2019  PT End of Session - 05/18/19 0819    Visit Number  11    Number of Visits  16    Date for PT Re-Evaluation  06/07/19    Authorization Type  FOTO AT LEAST EVERY 5TH VISIT.  PROGRESS NOTE AT 10TH VISIT.  KX MODIFIER AFTER 15 VISITS.    PT Start Time  [phone]    PT Stop Time  0902    PT Time Calculation (min)  46 min    Activity Tolerance  Patient tolerated treatment well    Behavior During Therapy  [facility] for tasks assessed/performed       Past Medical History:  Diagnosis Date  . Anginal pain ([place])   . Arthritis   . BPPV (benign paroxysmal positional vertigo)   . DVT (deep venous thrombosis) ([place])   . Dysrhythmia   . Graves disease 1996  . History of kidney stones   . History of pulmonary embolism   . Obesity   . Previous back surgery    x 3  . Rheumatoid arthritis ([place])   . Sleep apnea   . Thyroid disease    Graves Disease    Past Surgical History:  Procedure Laterality Date  . BACK SURGERY    . COLONOSCOPY    . EXAM UNDER ANESTHESIA WITH MANIPULATION OF KNEE Right 04/18/2015   Procedure: MANIPULATION OF RIGHT KNEE UNDER ANESTHESIA;  Surgeon: Carole Civil, MD;  [facility]: [facility];  Service: Orthopedics;  Laterality: Right;  . IVC Filter     removed December 2016  . [place]  2010  . KNEE ARTHROSCOPY Right 2006   meniscus   . KNEE ARTHROSCOPY WITH MEDIAL MENISECTOMY Left 04/24/2016   Procedure: LEFT KNEE ARTHROSCOPY WITH PARTIAL MEDIAL MENISECTOMY;  Surgeon: Carole Civil, MD;  [facility]: [facility];  Service: Orthopedics;  Laterality: Left;  . KNEE SURGERY     x 2  . KNEE SURGERY Left 1976   ?ligament repair   . LUMBAR WOUND  DEBRIDEMENT N/A 02/23/2014   Procedure: LUMBAR WOUND DEBRIDEMENT;  Surgeon: Faythe Ghee, MD;  Location: [place];  Service: Neurosurgery;  Laterality: N/A;  exploration lumbar wound. repair of dural defect.  Marland Kitchen MENISECTOMY Right    open medial   . NEPHROLITHOTOMY    . PERIPHERAL VASCULAR CATHETERIZATION N/A 01/30/2015   Procedure: IVC Filter Insertion;  Surgeon: Serafina Mitchell, MD;  Location: [place] [facility];  Service: Cardiovascular;  Laterality: N/A;  . PERIPHERAL VASCULAR CATHETERIZATION N/A 04/24/2015   Procedure: IVC Filter Removal;  Surgeon: Serafina Mitchell, MD;  Location: [place] [facility];  Service: Cardiovascular;  Laterality: N/A;  . PERIPHERAL VASCULAR CATHETERIZATION N/A 08/14/2015   Procedure: IVC Filter Removal;  Surgeon: Serafina Mitchell, MD;  Location: [place] [facility];  Service: Cardiovascular;  Laterality: N/A;  . SHOULDER ARTHROSCOPY WITH ROTATOR CUFF REPAIR AND OPEN BICEPS TENODESIS Left 02/15/2019   Procedure: SHOULDER ARTHROSCOPY WITH open ROTATOR CUFF REPAIR AND OPEN BICEPS TENODESIS;  Surgeon: Carole Civil, MD;  [facility];  Service: Orthopedics;  Laterality: Left;  . SPINE SURGERY  2015   Dr Hal Neer Fusion   . SPINE SURGERY  1999   discectomy  .  TOTAL KNEE ARTHROPLASTY Right 02/02/2015   Procedure:  RIGHT TOTAL KNEE ARTHROPLASTY;  Surgeon: Carole Civil, MD;  [facility];  Service: Orthopedics;  Laterality: Right;  LM with pt's daughter of new arrival time (10:45)    . TOTAL KNEE ARTHROPLASTY Left 07/24/2016   Procedure: TOTAL KNEE ARTHROPLASTY;  Surgeon: Carole Civil, MD;  [facility];  Service: Orthopedics;  Laterality: Left;    There were no vitals filed for this visit.  Subjective Assessment - 05/18/19 0815    Subjective  COVID 19 screening performed on patient upon arrival. Patient reports he has had a lot more pain in deltoids and more crepitus in L shoulder for the past few days. Reports that he has not done anything out of the  normal. Had to take pain medication this morning upon waking.    Pertinent History  Bilateral TKA's, DM, RA, spinal surgery.    Patient Stated Goals  Use left UE without pain.    Currently in Pain?  Yes    Pain Score  7     Pain Location  Shoulder    Pain Orientation  Left    Pain Descriptors / Indicators  Discomfort;Tightness;Tender    Pain Type  Surgical pain    Pain Onset  More than a month ago    Pain Frequency  Constant         [facility] PT Assessment - 05/18/19 0001      Assessment   Medical Diagnosis  S/p left rotator cuff repair.    Referring Provider (PT)  Arther Abbott MD    Onset Date/Surgical Date  02/15/19    Next MD Visit  05/2019                   [facility] Adult PT Treatment/Exercise - 05/18/19 0001      Shoulder Exercises: Pulleys   Flexion  5 minutes      Shoulder Exercises: ROM/Strengthening   [place]  in sitting flex/ext x3 min, CW and CCW circles x3 min      Modalities   Modalities  Electrical Stimulation;Vasopneumatic      Electrical Stimulation   Electrical Stimulation Location  L shoulder    Electrical Stimulation Action  IFC    Electrical Stimulation Parameters  80-150 hz x15 min    Electrical Stimulation Goals  Pain      Vasopneumatic   Number Minutes Vasopneumatic   15 minutes    Vasopnuematic Location   Shoulder    Vasopneumatic Pressure  Low    Vasopneumatic Temperature   34      Manual Therapy   Manual Therapy  Passive ROM    Passive ROM  PROM of L shoulder into flex, ER, IR with gentle holds at end range               PT Short Term Goals - 03/09/19 1458      PT SHORT TERM GOAL #1   Title  STG's=LTG's        PT Long Term Goals - 03/16/19 0851      PT LONG TERM GOAL #1   Title  Independent with a HEP.    Time  8    Period  Weeks    Status  Achieved      PT LONG TERM GOAL #2   Title  Active left shoulder flexion to 145-5 degrees so the patient can easily reach overhead.    Time  8    Period  Weeks     Status  On-going      PT LONG TERM GOAL #3   Title  Active ER to 70 degrees+ to allow for easily donning/doffing of apparel.    Time  8    Period  Weeks    Status  On-going      PT LONG TERM GOAL #4   Title  Increase ROM so patient is able to reach behind back to L3.    Time  8    Period  Weeks    Status  On-going      PT LONG TERM GOAL #5   Title  Increase left shoulder strength to a solid 4+/5 to increase stability for performance of functional activities.    Time  8    Period  Weeks    Status  On-going      PT LONG TERM GOAL #6   Title  Perform ADL's with pain not > 2-3/10.    Time  8    Period  Weeks    Status  On-going            Plan - 05/18/19 0849    Clinical Impression Statement  Patient presented in clinic with reports of increased L shoulder pain that has started within the last few days. Patient reported tightness of L shoulder with seated AAROM exercises intermittantly. Firm end feels and smooth arc of motion noted during PROM of L shoulder into flexion, ER, IR. Intermittant oscillations provided during PROM of L shoulder. No muscle guarding palpable in L deltoids upon palpation. Normal modalities response noted following removal of the modalities. Biofreeze applied to L deltoids and shoulder to reduce pain and soreness.    Personal Factors and Comorbidities  Age;Comorbidity 1;Comorbidity 2    Comorbidities  RA, DM.    Examination-Activity Limitations  Dressing    Stability/Clinical Decision Making  Stable/Uncomplicated    Rehab Potential  Excellent    PT Frequency  2x / week    PT Duration  8 weeks    PT Treatment/Interventions  ADLs/Self Care Home Management;Cryotherapy;Electrical Stimulation;Ultrasound;Moist Heat;Therapeutic activities;Therapeutic exercise;Manual techniques;Patient/family education;Passive range of motion;Vasopneumatic Device    PT Next Visit Plan  Continue with AAROM/AROM progression per protocol.    PT Home Exercise Plan  HEP- pendulums,  closed chain flexion stretch, AAROM ER    Consulted and Agree with Plan of Care  Patient       Patient will benefit from skilled therapeutic intervention in order to improve the following deficits and impairments:  Pain, Decreased activity tolerance, Decreased range of motion  Visit Diagnosis: Acute pain of left shoulder  Stiffness of left shoulder, not elsewhere classified     Problem List Patient Active Problem List   Diagnosis Date Noted  . S/P left rotator cuff repair/ biceps tenodesis 02/15/19  03/01/2019  . Labral tear of long head of left biceps tendon   . Partial tear of left subscapularis tendon   . Traumatic complete tear of left rotator cuff   . Family history of malignant neoplasm of prostate 07/28/2018  . History of Graves' disease 11/11/2017  . S/P total knee replacement, right 02/02/15 08/12/2017  . S/P total knee replacement, left 07/24/16 08/12/2017  . Hypercholesterolemia 11/08/2015  . History of DVT (deep vein thrombosis) 11/04/2015  . History of cocaine abuse ([place]) 11/04/2015  . History of heroin abuse ([place]) 11/04/2015  . Obesity 11/04/2015  . Parasomnia 11/04/2015  . Benign paroxysmal positional vertigo 10/29/2015  . Controlled type 2  diabetes mellitus without complication ([place]) XX123456  . Elevated prostate specific antigen (PSA) 10/29/2015  . HX: long term anticoagulant use 10/29/2015  . Sensorineural hearing loss 10/29/2015  . Sleep apnea with use of continuous positive airway pressure (CPAP) 10/29/2015  . Arthrofibrosis of total knee arthroplasty ([place])   . Pulmonary embolus ([place]) 01/09/2015  . UTI (lower urinary tract infection) 02/21/2014  . Other headache syndrome 02/21/2014  . Nausea with vomiting 02/21/2014  . Rheumatoid arthritis ([place]) 02/21/2014  . Nausea & vomiting 02/21/2014  . Headache(784.0) 02/21/2014  . Lumbar spinal stenosis 02/10/2014    Standley Brooking, PTA 05/18/2019, 10:09 AM  [facility] [place], [place], [address] Phone: [phone]   Fax:  [phone]  Name: Willie Howe MRN: [id number] Date of Birth: 09-20-1960

## 2019-05-24 ENCOUNTER — Other Ambulatory Visit: Payer: Self-pay

## 2019-05-24 DIAGNOSIS — Z9889 Other specified postprocedural states: Secondary | ICD-10-CM

## 2019-05-24 MED ORDER — HYDROCODONE-ACETAMINOPHEN 7.5-325 MG PO TABS: 1.0000 | ORAL_TABLET | Freq: Three times a day (TID) | ORAL | 0 refills | Status: DC | PRN

## 2019-05-25 ENCOUNTER — Other Ambulatory Visit: Payer: Self-pay

## 2019-05-25 ENCOUNTER — Encounter: Payer: Self-pay | Admitting: Physical Therapy

## 2019-05-25 ENCOUNTER — Ambulatory Visit: Payer: Medicare HMO | Attending: Orthopedic Surgery | Admitting: Physical Therapy

## 2019-05-25 DIAGNOSIS — M25512 Pain in left shoulder: Secondary | ICD-10-CM | POA: Insufficient documentation

## 2019-05-25 DIAGNOSIS — M25612 Stiffness of left shoulder, not elsewhere classified: Secondary | ICD-10-CM | POA: Insufficient documentation

## 2019-05-25 NOTE — Therapy (Signed)
[facility] [place], [place], [address] Phone: [phone]   Fax:  [phone]  Physical Therapy Treatment  Patient Details  Name: Willie Howe MRN: [id number] Date of Birth: [birth year] Referring Provider (PT): Arther Abbott MD   Encounter Date: 05/25/2019  PT End of Session - 05/25/19 0819    Visit Number  12    Number of Visits  16    Date for PT Re-Evaluation  06/07/19    Authorization Type  FOTO AT LEAST EVERY 5TH VISIT.  PROGRESS NOTE AT 10TH VISIT.  KX MODIFIER AFTER 15 VISITS.    PT Start Time  [phone]    PT Stop Time  0914    PT Time Calculation (min)  58 min    Activity Tolerance  Patient tolerated treatment well    Behavior During Therapy  [facility] for tasks assessed/performed       Past Medical History:  Diagnosis Date  . Anginal pain ([place])   . Arthritis   . BPPV (benign paroxysmal positional vertigo)   . DVT (deep venous thrombosis) ([place])   . Dysrhythmia   . Graves disease 1996  . History of kidney stones   . History of pulmonary embolism   . Obesity   . Previous back surgery    x 3  . Rheumatoid arthritis ([place])   . Sleep apnea   . Thyroid disease    Graves Disease    Past Surgical History:  Procedure Laterality Date  . BACK SURGERY    . COLONOSCOPY    . EXAM UNDER ANESTHESIA WITH MANIPULATION OF KNEE Right 04/18/2015   Procedure: MANIPULATION OF RIGHT KNEE UNDER ANESTHESIA;  Surgeon: Carole Civil, MD;  Location: [facility];  Service: Orthopedics;  Laterality: Right;  . IVC Filter     removed December 2016  . [place]  2010  . KNEE ARTHROSCOPY Right 2006   meniscus   . KNEE ARTHROSCOPY WITH MEDIAL MENISECTOMY Left 04/24/2016   Procedure: LEFT KNEE ARTHROSCOPY WITH PARTIAL MEDIAL MENISECTOMY;  Surgeon: Carole Civil, MD;  [facility];  Service: Orthopedics;  Laterality: Left;  . KNEE SURGERY     x 2  . KNEE SURGERY Left 1976   ?ligament repair   . LUMBAR WOUND  DEBRIDEMENT N/A 02/23/2014   Procedure: LUMBAR WOUND DEBRIDEMENT;  Surgeon: Faythe Ghee, MD;  Location: [place];  Service: Neurosurgery;  Laterality: N/A;  exploration lumbar wound. repair of dural defect.  Marland Kitchen MENISECTOMY Right    open medial   . NEPHROLITHOTOMY    . PERIPHERAL VASCULAR CATHETERIZATION N/A 01/30/2015   Procedure: IVC Filter Insertion;  Surgeon: Serafina Mitchell, MD;  Location: [place] [facility];  Service: Cardiovascular;  Laterality: N/A;  . PERIPHERAL VASCULAR CATHETERIZATION N/A 04/24/2015   Procedure: IVC Filter Removal;  Surgeon: Serafina Mitchell, MD;  Location: [place] [facility];  Service: Cardiovascular;  Laterality: N/A;  . PERIPHERAL VASCULAR CATHETERIZATION N/A 08/14/2015   Procedure: IVC Filter Removal;  Surgeon: Serafina Mitchell, MD;  Location: [place] [facility];  Service: Cardiovascular;  Laterality: N/A;  . SHOULDER ARTHROSCOPY WITH ROTATOR CUFF REPAIR AND OPEN BICEPS TENODESIS Left 02/15/2019   Procedure: SHOULDER ARTHROSCOPY WITH open ROTATOR CUFF REPAIR AND OPEN BICEPS TENODESIS;  Surgeon: Carole Civil, MD;  [facility]: [facility];  Service: Orthopedics;  Laterality: Left;  . SPINE SURGERY  2015   Dr Hal Neer Fusion   . SPINE SURGERY  1999   discectomy  .  TOTAL KNEE ARTHROPLASTY Right 02/02/2015   Procedure:  RIGHT TOTAL KNEE ARTHROPLASTY;  Surgeon: Carole Civil, MD;  [facility];  Service: Orthopedics;  Laterality: Right;  LM with pt's daughter of new arrival time (10:45)    . TOTAL KNEE ARTHROPLASTY Left 07/24/2016   Procedure: TOTAL KNEE ARTHROPLASTY;  Surgeon: Carole Civil, MD;  [facility]: [facility];  Service: Orthopedics;  Laterality: Left;    There were no vitals filed for this visit.  Subjective Assessment - 05/25/19 0818    Subjective  COVID 19 screening performed on patient upon arrival. Patient reports 4/10 L shoulder soreness today. Not as much catch in L shoulder than last week.    Pertinent History  Bilateral TKA's, DM, RA, spinal  surgery.    Patient Stated Goals  Use left UE without pain.    Currently in Pain?  Yes    Pain Score  4     Pain Location  Shoulder    Pain Orientation  Left    Pain Descriptors / Indicators  Sore    Pain Type  Surgical pain    Pain Onset  More than a month ago    Pain Frequency  Constant         [facility] PT Assessment - 05/25/19 0001      Assessment   Medical Diagnosis  S/p left rotator cuff repair.    Referring Provider (PT)  Arther Abbott MD    Onset Date/Surgical Date  02/15/19    Next MD Visit  05/2019                   [facility] Adult PT Treatment/Exercise - 05/25/19 0001      Shoulder Exercises: Standing   External Rotation  Strengthening;Left;20 reps;Theraband    Theraband Level (Shoulder External Rotation)  Level 1 (Yellow)    Internal Rotation  Strengthening;Left;20 reps;Theraband    Theraband Level (Shoulder Internal Rotation)  Level 1 (Yellow)    Extension  Strengthening;Left;20 reps;Theraband    Theraband Level (Shoulder Extension)  Level 1 (Yellow)    Row  Strengthening;Left;20 reps;Theraband    Theraband Level (Shoulder Row)  Level 1 (Yellow)      Shoulder Exercises: Pulleys   Flexion  5 minutes      Shoulder Exercises: ROM/Strengthening   UBE (Upper Arm Bike)  90 RPM x8 min    [place]  in standing flexion x20 reps      Modalities   Modalities  Electrical Stimulation;Vasopneumatic      Electrical Stimulation   Electrical Stimulation Location  L shoulder    Electrical Stimulation Action  IFC    Electrical Stimulation Parameters  80-150 hz x15 min    Electrical Stimulation Goals  Pain      Vasopneumatic   Number Minutes Vasopneumatic   15 minutes    Vasopnuematic Location   Shoulder    Vasopneumatic Pressure  Low    Vasopneumatic Temperature   34      Manual Therapy   Manual Therapy  Passive ROM    Passive ROM  PROM of L shoulder into flex, ER, IR with gentle holds at end range               PT Short Term Goals - 03/09/19 1458       PT SHORT TERM GOAL #1   Title  STG's=LTG's        PT Long Term Goals - 03/16/19 0851      PT LONG TERM GOAL #  1   Title  Independent with a HEP.    Time  8    Period  Weeks    Status  Achieved      PT LONG TERM GOAL #2   Title  Active left shoulder flexion to 145-5 degrees so the patient can easily reach overhead.    Time  8    Period  Weeks    Status  On-going      PT LONG TERM GOAL #3   Title  Active ER to 70 degrees+ to allow for easily donning/doffing of apparel.    Time  8    Period  Weeks    Status  On-going      PT LONG TERM GOAL #4   Title  Increase ROM so patient is able to reach behind back to L3.    Time  8    Period  Weeks    Status  On-going      PT LONG TERM GOAL #5   Title  Increase left shoulder strength to a solid 4+/5 to increase stability for performance of functional activities.    Time  8    Period  Weeks    Status  On-going      PT LONG TERM GOAL #6   Title  Perform ADL's with pain not > 2-3/10.    Time  8    Period  Weeks    Status  On-going            Plan - 05/25/19 0905    Clinical Impression Statement  Patient presented in clinic with less muscle guarding pain and less sharp catching pain. Patient guided through more AAROM exercises as well as introduced to light strengthening. Only mid range completed with L shoulder ER. No complaints reported by patient during therex. Firm end feels and smooth arc of motion noted during PROM of L shoulder in all directions. Normal modalites response noted following removal of the modalities.    Personal Factors and Comorbidities  Age;Comorbidity 1;Comorbidity 2    Comorbidities  RA, DM.    Examination-Activity Limitations  Dressing    Stability/Clinical Decision Making  Stable/Uncomplicated    Rehab Potential  Excellent    PT Frequency  2x / week    PT Duration  8 weeks    PT Treatment/Interventions  ADLs/Self Care Home Management;Cryotherapy;Electrical Stimulation;Ultrasound;Moist  Heat;Therapeutic activities;Therapeutic exercise;Manual techniques;Patient/family education;Passive range of motion;Vasopneumatic Device;Iontophoresis 4mg /ml Dexamethasone    PT Next Visit Plan  Continue with AAROM/AROM progression per protocol.    PT Home Exercise Plan  HEP- pendulums, closed chain flexion stretch, AAROM ER    Consulted and Agree with Plan of Care  Patient       Patient will benefit from skilled therapeutic intervention in order to improve the following deficits and impairments:  Pain, Decreased activity tolerance, Decreased range of motion  Visit Diagnosis: Acute pain of left shoulder  Stiffness of left shoulder, not elsewhere classified     Problem List Patient Active Problem List   Diagnosis Date Noted  . S/P left rotator cuff repair/ biceps tenodesis 02/15/19  03/01/2019  . Labral tear of long head of left biceps tendon   . Partial tear of left subscapularis tendon   . Traumatic complete tear of left rotator cuff   . Family history of malignant neoplasm of prostate 07/28/2018  . History of Graves' disease 11/11/2017  . S/P total knee replacement, right 02/02/15 08/12/2017  . S/P total knee replacement, left 07/24/16 08/12/2017  .  Hypercholesterolemia 11/08/2015  . History of DVT (deep vein thrombosis) 11/04/2015  . History of cocaine abuse ([place]) 11/04/2015  . History of heroin abuse ([place]) 11/04/2015  . Obesity 11/04/2015  . Parasomnia 11/04/2015  . Benign paroxysmal positional vertigo 10/29/2015  . Controlled type 2 diabetes mellitus without complication ([place]) XX123456  . Elevated prostate specific antigen (PSA) 10/29/2015  . HX: long term anticoagulant use 10/29/2015  . Sensorineural hearing loss 10/29/2015  . Sleep apnea with use of continuous positive airway pressure (CPAP) 10/29/2015  . Arthrofibrosis of total knee arthroplasty ([place])   . Pulmonary embolus ([place]) 01/09/2015  . UTI (lower urinary tract infection) 02/21/2014  . Other headache syndrome  02/21/2014  . Nausea with vomiting 02/21/2014  . Rheumatoid arthritis ([place]) 02/21/2014  . Nausea & vomiting 02/21/2014  . Headache(784.0) 02/21/2014  . Lumbar spinal stenosis 02/10/2014    Standley Brooking, PTA 05/25/2019, 9:18 AM  [facility] [place], [place], [address] Phone: [phone]   Fax:  [phone]  Name: Willie Howe MRN: [id number] Date of Birth: 31-May-1961

## 2019-05-30 DIAGNOSIS — Z79899 Other long term (current) drug therapy: Secondary | ICD-10-CM | POA: Diagnosis not present

## 2019-05-30 DIAGNOSIS — M0589 Other rheumatoid arthritis with rheumatoid factor of multiple sites: Secondary | ICD-10-CM | POA: Diagnosis not present

## 2019-05-31 DIAGNOSIS — E6609 Other obesity due to excess calories: Secondary | ICD-10-CM | POA: Diagnosis not present

## 2019-05-31 DIAGNOSIS — Z0000 Encounter for general adult medical examination without abnormal findings: Secondary | ICD-10-CM | POA: Diagnosis not present

## 2019-05-31 DIAGNOSIS — Z6831 Body mass index (BMI) 31.0-31.9, adult: Secondary | ICD-10-CM | POA: Diagnosis not present

## 2019-05-31 DIAGNOSIS — E7800 Pure hypercholesterolemia, unspecified: Secondary | ICD-10-CM | POA: Diagnosis not present

## 2019-05-31 DIAGNOSIS — Z23 Encounter for immunization: Secondary | ICD-10-CM | POA: Diagnosis not present

## 2019-05-31 DIAGNOSIS — Z1329 Encounter for screening for other suspected endocrine disorder: Secondary | ICD-10-CM | POA: Diagnosis not present

## 2019-05-31 DIAGNOSIS — E119 Type 2 diabetes mellitus without complications: Secondary | ICD-10-CM | POA: Diagnosis not present

## 2019-05-31 DIAGNOSIS — E781 Pure hyperglyceridemia: Secondary | ICD-10-CM | POA: Diagnosis not present

## 2019-05-31 DIAGNOSIS — I10 Essential (primary) hypertension: Secondary | ICD-10-CM | POA: Diagnosis not present

## 2019-06-01 ENCOUNTER — Other Ambulatory Visit: Payer: Self-pay

## 2019-06-01 ENCOUNTER — Ambulatory Visit: Payer: Medicare HMO | Admitting: Physical Therapy

## 2019-06-01 ENCOUNTER — Encounter: Payer: Self-pay | Admitting: Physical Therapy

## 2019-06-01 DIAGNOSIS — M25512 Pain in left shoulder: Secondary | ICD-10-CM

## 2019-06-01 DIAGNOSIS — M25612 Stiffness of left shoulder, not elsewhere classified: Secondary | ICD-10-CM | POA: Diagnosis not present

## 2019-06-01 NOTE — Therapy (Signed)
[facility] [place], [place], [address] Phone: [phone]   Fax:  [phone]  Physical Therapy Treatment  Patient Details  Name: Willie Howe MRN: [id number] Date of Birth: [birth year] Referring Provider (PT): Arther Abbott MD   Encounter Date: 06/01/2019  PT End of Session - 06/01/19 0857    Visit Number  13    Number of Visits  16    Date for PT Re-Evaluation  06/07/19    Authorization Type  FOTO AT LEAST EVERY 5TH VISIT.  PROGRESS NOTE AT 10TH VISIT.  KX MODIFIER AFTER 15 VISITS.    PT Start Time  [phone]    PT Stop Time  0912    PT Time Calculation (min)  56 min    Activity Tolerance  Patient tolerated treatment well;Patient limited by pain    Behavior During Therapy  [facility] for tasks assessed/performed       Past Medical History:  Diagnosis Date  . Anginal pain ([place])   . Arthritis   . BPPV (benign paroxysmal positional vertigo)   . DVT (deep venous thrombosis) ([place])   . Dysrhythmia   . Graves disease 1996  . History of kidney stones   . History of pulmonary embolism   . Obesity   . Previous back surgery    x 3  . Rheumatoid arthritis ([place])   . Sleep apnea   . Thyroid disease    Graves Disease    Past Surgical History:  Procedure Laterality Date  . BACK SURGERY    . COLONOSCOPY    . EXAM UNDER ANESTHESIA WITH MANIPULATION OF KNEE Right 04/18/2015   Procedure: MANIPULATION OF RIGHT KNEE UNDER ANESTHESIA;  Surgeon: Carole Civil, MD;  Location: [facility];  Service: Orthopedics;  Laterality: Right;  . IVC Filter     removed December 2016  . [place]  2010  . KNEE ARTHROSCOPY Right 2006   meniscus   . KNEE ARTHROSCOPY WITH MEDIAL MENISECTOMY Left 04/24/2016   Procedure: LEFT KNEE ARTHROSCOPY WITH PARTIAL MEDIAL MENISECTOMY;  Surgeon: Carole Civil, MD;  Location: [facility];  Service: Orthopedics;  Laterality: Left;  . KNEE SURGERY     x 2  . KNEE SURGERY Left 1976   ?ligament repair    . LUMBAR WOUND DEBRIDEMENT N/A 02/23/2014   Procedure: LUMBAR WOUND DEBRIDEMENT;  Surgeon: Faythe Ghee, MD;  Location: [place];  Service: Neurosurgery;  Laterality: N/A;  exploration lumbar wound. repair of dural defect.  Marland Kitchen MENISECTOMY Right    open medial   . NEPHROLITHOTOMY    . PERIPHERAL VASCULAR CATHETERIZATION N/A 01/30/2015   Procedure: IVC Filter Insertion;  Surgeon: Serafina Mitchell, MD;  Location: [place] [facility];  Service: Cardiovascular;  Laterality: N/A;  . PERIPHERAL VASCULAR CATHETERIZATION N/A 04/24/2015   Procedure: IVC Filter Removal;  Surgeon: Serafina Mitchell, MD;  Location: [place] [facility];  Service: Cardiovascular;  Laterality: N/A;  . PERIPHERAL VASCULAR CATHETERIZATION N/A 08/14/2015   Procedure: IVC Filter Removal;  Surgeon: Serafina Mitchell, MD;  Location: [place] [facility];  Service: Cardiovascular;  Laterality: N/A;  . SHOULDER ARTHROSCOPY WITH ROTATOR CUFF REPAIR AND OPEN BICEPS TENODESIS Left 02/15/2019   Procedure: SHOULDER ARTHROSCOPY WITH open ROTATOR CUFF REPAIR AND OPEN BICEPS TENODESIS;  Surgeon: Carole Civil, MD;  [facility];  Service: Orthopedics;  Laterality: Left;  . SPINE SURGERY  2015   Dr Hal Neer Fusion   . [place]  discectomy  . TOTAL KNEE ARTHROPLASTY Right 02/02/2015   Procedure:  RIGHT TOTAL KNEE ARTHROPLASTY;  Surgeon: Carole Civil, MD;  [facility];  Service: Orthopedics;  Laterality: Right;  LM with pt's daughter of new arrival time (10:45)    . TOTAL KNEE ARTHROPLASTY Left 07/24/2016   Procedure: TOTAL KNEE ARTHROPLASTY;  Surgeon: Carole Civil, MD;  [facility];  Service: Orthopedics;  Laterality: Left;    There were no vitals filed for this visit.  Subjective Assessment - 06/01/19 0819    Subjective  COVID 19 screening performed on patient upon arrival. Patient reports discomfort in shoulder but may be due to flu shot    Pertinent History  Bilateral TKA's, DM, RA, spinal surgery.    Patient  Stated Goals  Use left UE without pain.    Currently in Pain?  Yes    Pain Score  5     Pain Location  Shoulder    Pain Orientation  Left    Pain Descriptors / Indicators  Sore;Discomfort    Pain Type  Surgical pain    Pain Onset  More than a month ago    Pain Frequency  Intermittent    Aggravating Factors   use and movement of shoulder    Pain Relieving Factors  at rest         [facility] PT Assessment - 06/01/19 0001      ROM / Strength   AROM / PROM / Strength  AROM;PROM      AROM   AROM Assessment Site  Shoulder    Right/Left Shoulder  Left    Left Shoulder Flexion  129 Degrees    Left Shoulder Internal Rotation  45 Degrees      PROM   PROM Assessment Site  Shoulder    Right/Left Shoulder  Left    Left Shoulder Flexion  134 Degrees    Left Shoulder External Rotation  60 Degrees                   [facility] Adult PT Treatment/Exercise - 06/01/19 0001      Shoulder Exercises: Standing   External Rotation  Strengthening;Left;20 reps;Theraband    Theraband Level (Shoulder External Rotation)  Level 1 (Yellow)    Internal Rotation  Strengthening;Left;20 reps;Theraband    Theraband Level (Shoulder Internal Rotation)  Level 1 (Yellow)    Extension  Strengthening;Left;20 reps;Theraband    Theraband Level (Shoulder Extension)  Level 1 (Yellow)    Row  Strengthening;Left;20 reps;Theraband    Theraband Level (Shoulder Row)  Level 1 (Yellow)    Other Standing Exercises  wall slides and lower x20      Shoulder Exercises: Pulleys   Flexion  5 minutes      Shoulder Exercises: ROM/Strengthening   UBE (Upper Arm Bike)  90 RPM x8 min      Electrical Stimulation   Electrical Stimulation Location  L shoulder    Electrical Stimulation Action  IFC    Electrical Stimulation Parameters  80-150hz  x45min    Electrical Stimulation Goals  Pain      Vasopneumatic   Number Minutes Vasopneumatic   15 minutes    Vasopnuematic Location   Shoulder    Vasopneumatic Pressure  Low     Vasopneumatic Temperature   34      Manual Therapy   Manual Therapy  Passive ROM    Passive ROM  PROM of L shoulder into flex, ER, IR with gentle holds at end  range               PT Short Term Goals - 03/09/19 1458      PT SHORT TERM GOAL #1   Title  STG's=LTG's        PT Long Term Goals - 06/01/19 0827      PT LONG TERM GOAL #1   Title  Independent with a HEP.    Time  8    Period  Weeks    Status  Achieved      PT LONG TERM GOAL #2   Title  Active left shoulder flexion to 145-5 degrees so the patient can easily reach overhead.    Time  8    Period  Weeks    Status  On-going   AROM 129 degrees 06/01/19     PT LONG TERM GOAL #3   Title  Active ER to 70 degrees+ to allow for easily donning/doffing of apparel.    Time  8    Period  Weeks    Status  On-going   AROM 45 degees 06/01/19     PT LONG TERM GOAL #4   Title  Increase ROM so patient is able to reach behind back to L3.    Time  8    Period  Weeks    Status  On-going   NT 06/03/19     PT LONG TERM GOAL #5   Title  Increase left shoulder strength to a solid 4+/5 to increase stability for performance of functional activities.    Time  8    Period  Weeks    Status  On-going   NT 06/01/19     PT LONG TERM GOAL #6   Title  Perform ADL's with pain not > 2-3/10.    Time  8    Period  Weeks    Status  On-going            Plan - 06/01/19 0859    Clinical Impression Statement  Patient tolerated treatment fair today due to increased soreness from a flu shot in left arm yesterday. Patient able to progress with exercises today followed by manual ROM. Patient has improved overall yet ongoing limitations with ROM, strength and pain deficts. Goals progressing at this time.    Personal Factors and Comorbidities  Age;Comorbidity 1;Comorbidity 2    Comorbidities  RA, DM.    Examination-Activity Limitations  Dressing    Stability/Clinical Decision Making  Stable/Uncomplicated    Rehab Potential  Excellent     PT Frequency  2x / week    PT Duration  8 weeks    PT Treatment/Interventions  ADLs/Self Care Home Management;Cryotherapy;Electrical Stimulation;Ultrasound;Moist Heat;Therapeutic activities;Therapeutic exercise;Manual techniques;Patient/family education;Passive range of motion;Vasopneumatic Device;Iontophoresis 4mg /ml Dexamethasone    PT Next Visit Plan  Continue with AAROM/AROM progression per protocol.    Consulted and Agree with Plan of Care  Patient       Patient will benefit from skilled therapeutic intervention in order to improve the following deficits and impairments:  Pain, Decreased activity tolerance, Decreased range of motion  Visit Diagnosis: Acute pain of left shoulder  Stiffness of left shoulder, not elsewhere classified     Problem List Patient Active Problem List   Diagnosis Date Noted  . S/P left rotator cuff repair/ biceps tenodesis 02/15/19  03/01/2019  . Labral tear of long head of left biceps tendon   . Partial tear of left subscapularis tendon   . Traumatic complete tear of left  rotator cuff   . Family history of malignant neoplasm of prostate 07/28/2018  . History of Graves' disease 11/11/2017  . S/P total knee replacement, right 02/02/15 08/12/2017  . S/P total knee replacement, left 07/24/16 08/12/2017  . Hypercholesterolemia 11/08/2015  . History of DVT (deep vein thrombosis) 11/04/2015  . History of cocaine abuse ([place]) 11/04/2015  . History of heroin abuse ([place]) 11/04/2015  . Obesity 11/04/2015  . Parasomnia 11/04/2015  . Benign paroxysmal positional vertigo 10/29/2015  . Controlled type 2 diabetes mellitus without complication ([place]) [id number]  . Elevated prostate specific antigen (PSA) 10/29/2015  . HX: long term anticoagulant use 10/29/2015  . Sensorineural hearing loss 10/29/2015  . Sleep apnea with use of continuous positive airway pressure (CPAP) 10/29/2015  . Arthrofibrosis of total knee arthroplasty ([place])   . Pulmonary embolus ([place])  01/09/2015  . UTI (lower urinary tract infection) 02/21/2014  . Other headache syndrome 02/21/2014  . Nausea with vomiting 02/21/2014  . Rheumatoid arthritis ([place]) 02/21/2014  . Nausea & vomiting 02/21/2014  . Headache(784.0) 02/21/2014  . Lumbar spinal stenosis 02/10/2014    Nadege Carriger P, PTA 06/01/2019, 9:26 AM  [facility] [place], [place], [address] Phone: [phone]   Fax:  [phone]  Name: RAMSEY BJERK MRN: [id number] Date of Birth: 11/11/1960

## 2019-06-08 ENCOUNTER — Encounter: Payer: Self-pay | Admitting: Orthopedic Surgery

## 2019-06-08 ENCOUNTER — Ambulatory Visit: Payer: Medicare HMO | Admitting: Physical Therapy

## 2019-06-08 ENCOUNTER — Encounter: Payer: Self-pay | Admitting: Physical Therapy

## 2019-06-08 ENCOUNTER — Ambulatory Visit (INDEPENDENT_AMBULATORY_CARE_PROVIDER_SITE_OTHER): Payer: Medicare HMO | Admitting: Orthopedic Surgery

## 2019-06-08 ENCOUNTER — Other Ambulatory Visit: Payer: Self-pay

## 2019-06-08 VITALS — BP 132/87 | HR 81 | Temp 97.2°F | Ht 75.0 in | Wt 283.0 lb

## 2019-06-08 DIAGNOSIS — M25612 Stiffness of left shoulder, not elsewhere classified: Secondary | ICD-10-CM | POA: Diagnosis not present

## 2019-06-08 DIAGNOSIS — M25512 Pain in left shoulder: Secondary | ICD-10-CM | POA: Diagnosis not present

## 2019-06-08 DIAGNOSIS — Z9889 Other specified postprocedural states: Secondary | ICD-10-CM | POA: Diagnosis not present

## 2019-06-08 MED ORDER — HYDROCODONE-ACETAMINOPHEN 7.5-325 MG PO TABS: 1.0000 | ORAL_TABLET | Freq: Three times a day (TID) | ORAL | 0 refills | Status: DC | PRN

## 2019-06-08 NOTE — Therapy (Signed)
[facility] [place], [place], [address] Phone: [phone]   Fax:  [phone]  Physical Therapy Treatment  Patient Details  Name: Willie Howe MRN: [id number] Date of Birth: [birth year] Referring Provider (PT): Arther Abbott MD   Encounter Date: 06/08/2019  PT End of Session - 06/08/19 0819    Visit Number  14    Number of Visits  16    Date for PT Re-Evaluation  06/07/19    Authorization Type  FOTO AT LEAST EVERY 5TH VISIT.  PROGRESS NOTE AT 10TH VISIT.  KX MODIFIER AFTER 15 VISITS.    PT Start Time  [phone]    PT Stop Time  0904    PT Time Calculation (min)  48 min    Activity Tolerance  Patient tolerated treatment well    Behavior During Therapy  [facility] for tasks assessed/performed       Past Medical History:  Diagnosis Date  . Anginal pain ([place])   . Arthritis   . BPPV (benign paroxysmal positional vertigo)   . DVT (deep venous thrombosis) ([place])   . Dysrhythmia   . Graves disease 1996  . History of kidney stones   . History of pulmonary embolism   . Obesity   . Previous back surgery    x 3  . Rheumatoid arthritis ([place])   . Sleep apnea   . Thyroid disease    Graves Disease    Past Surgical History:  Procedure Laterality Date  . BACK SURGERY    . COLONOSCOPY    . EXAM UNDER ANESTHESIA WITH MANIPULATION OF KNEE Right 04/18/2015   Procedure: MANIPULATION OF RIGHT KNEE UNDER ANESTHESIA;  Surgeon: Carole Civil, MD;  Location[facility];  Service: Orthopedics;  Laterality: Right;  . IVC Filter     removed December 2016  . [place]  2010  . KNEE ARTHROSCOPY Right 2006   meniscus   . KNEE ARTHROSCOPY WITH MEDIAL MENISECTOMY Left 04/24/2016   Procedure: LEFT KNEE ARTHROSCOPY WITH PARTIAL MEDIAL MENISECTOMY;  Surgeon: Carole Civil, MD;  [facility]: [facility];  Service: Orthopedics;  Laterality: Left;  . KNEE SURGERY     x 2  . KNEE SURGERY Left 1976   ?ligament repair   . LUMBAR WOUND  DEBRIDEMENT N/A 02/23/2014   Procedure: LUMBAR WOUND DEBRIDEMENT;  Surgeon: Faythe Ghee, MD;  Location: [place];  Service: Neurosurgery;  Laterality: N/A;  exploration lumbar wound. repair of dural defect.  Marland Kitchen MENISECTOMY Right    open medial   . NEPHROLITHOTOMY    . PERIPHERAL VASCULAR CATHETERIZATION N/A 01/30/2015   Procedure: IVC Filter Insertion;  Surgeon: Serafina Mitchell, MD;  Location: [place] [facility];  Service: Cardiovascular;  Laterality: N/A;  . PERIPHERAL VASCULAR CATHETERIZATION N/A 04/24/2015   Procedure: IVC Filter Removal;  Surgeon: Serafina Mitchell, MD;  Location: [place] [facility];  Service: Cardiovascular;  Laterality: N/A;  . PERIPHERAL VASCULAR CATHETERIZATION N/A 08/14/2015   Procedure: IVC Filter Removal;  Surgeon: Serafina Mitchell, MD;  Location: [place] [facility];  Service: Cardiovascular;  Laterality: N/A;  . SHOULDER ARTHROSCOPY WITH ROTATOR CUFF REPAIR AND OPEN BICEPS TENODESIS Left 02/15/2019   Procedure: SHOULDER ARTHROSCOPY WITH open ROTATOR CUFF REPAIR AND OPEN BICEPS TENODESIS;  Surgeon: Carole Civil, MD;  [facility];  Service: Orthopedics;  Laterality: Left;  . SPINE SURGERY  2015   Dr Hal Neer Fusion   . SPINE SURGERY  1999   discectomy  .  TOTAL KNEE ARTHROPLASTY Right 02/02/2015   Procedure:  RIGHT TOTAL KNEE ARTHROPLASTY;  Surgeon: Carole Civil, MD;  [facility]: [facility];  Service: Orthopedics;  Laterality: Right;  LM with pt's daughter of new arrival time (10:45)    . TOTAL KNEE ARTHROPLASTY Left 07/24/2016   Procedure: TOTAL KNEE ARTHROPLASTY;  Surgeon: Carole Civil, MD;  Location: [facility];  Service: Orthopedics;  Laterality: Left;    There were no vitals filed for this visit.  Subjective Assessment - 06/08/19 0818    Subjective  COVID 19 screening performed on patient upon arrival. Patient reports shoulder being very crunchy with movement. Reports aching last night and very sore this morning.    Pertinent History  Bilateral TKA's, DM,  RA, spinal surgery.    Patient Stated Goals  Use left UE without pain.    Currently in Pain?  Yes    Pain Score  4     Pain Location  Shoulder    Pain Orientation  Left    Pain Descriptors / Indicators  Sore;Throbbing    Pain Type  Surgical pain    Pain Onset  More than a month ago    Pain Frequency  Constant         [facility] PT Assessment - 06/08/19 0001      Assessment   Medical Diagnosis  S/p left rotator cuff repair.    Referring Provider (PT)  Arther Abbott MD    Onset Date/Surgical Date  02/15/19    Next MD Visit  05/2019    Prior Therapy  06/08/2019      ROM / Strength   AROM / PROM / Strength  AROM;Strength      AROM   Overall AROM   Deficits    AROM Assessment Site  Shoulder    Right/Left Shoulder  Left    Left Shoulder Flexion  120 Degrees    Left Shoulder Internal Rotation  50 Degrees    Left Shoulder External Rotation  50 Degrees      Strength   Overall Strength  Deficits    Strength Assessment Site  Shoulder    Right/Left Shoulder  Left    Left Shoulder Flexion  4/5    Left Shoulder Internal Rotation  4+/5    Left Shoulder External Rotation  4/5                   [facility] Adult PT Treatment/Exercise - 06/08/19 0001      Shoulder Exercises: Standing   Protraction  Strengthening;Left;20 reps;Theraband    Theraband Level (Shoulder Protraction)  Level 1 (Yellow)    External Rotation  Strengthening;Left;20 reps;Theraband    Theraband Level (Shoulder External Rotation)  Level 1 (Yellow)    Internal Rotation  Strengthening;Left;20 reps;Theraband    Theraband Level (Shoulder Internal Rotation)  Level 1 (Yellow)    Flexion  AROM;Left;20 reps   wall slides with less pain in ER   Extension  Strengthening;Left;20 reps;Theraband    Theraband Level (Shoulder Extension)  Level 1 (Yellow)    Row  Strengthening;Left;20 reps;Theraband    Theraband Level (Shoulder Row)  Level 1 (Yellow)      Shoulder Exercises: Pulleys   Flexion  5 minutes       Shoulder Exercises: ROM/Strengthening   UBE (Upper Arm Bike)  90 RPM x8 min      Modalities   Modalities  Electrical Stimulation;Vasopneumatic      Electrical Stimulation   Electrical Stimulation Location  L shoulder  Electrical Stimulation Action  IFC    Electrical Stimulation Parameters  80-150 hz x15 min    Electrical Stimulation Goals  Pain      Vasopneumatic   Number Minutes Vasopneumatic   15 minutes    Vasopnuematic Location   Shoulder    Vasopneumatic Pressure  Low    Vasopneumatic Temperature   34               PT Short Term Goals - 03/09/19 1458      PT SHORT TERM GOAL #1   Title  STG's=LTG's        PT Long Term Goals - 06/08/19 0913      PT LONG TERM GOAL #1   Title  Independent with a HEP.    Time  8    Period  Weeks    Status  Achieved      PT LONG TERM GOAL #2   Title  Active left shoulder flexion to 145-5 degrees so the patient can easily reach overhead.    Time  8    Period  Weeks    Status  Not Met   due to pain     PT LONG TERM GOAL #3   Title  Active ER to 70 degrees+ to allow for easily donning/doffing of apparel.    Time  8    Period  Weeks    Status  Not Met   AROM 45 degees 06/01/19     PT LONG TERM GOAL #4   Title  Increase ROM so patient is able to reach behind back to L3.    Time  8    Period  Weeks    Status  On-going   NT 06/03/19     PT LONG TERM GOAL #5   Title  Increase left shoulder strength to a solid 4+/5 to increase stability for performance of functional activities.    Time  8    Period  Weeks    Status  Partially Met   NT 06/01/19     PT LONG TERM GOAL #6   Title  Perform ADL's with pain not > 2-3/10.    Time  8    Period  Weeks    Status  On-going            Plan - 06/08/19 0913    Clinical Impression Statement  Patient presented in clinic with greater discomfort of L shoulder. Patient able to complete wall slides with less discomfort with shoulder in ER. ROM measurements provided in today's  note limited by pain especially L shoulder flexion. Flexion and ER MMT deficient as well but no pain reported by patient. Normal modalities response noted following removal of the modalities.    Personal Factors and Comorbidities  Age;Comorbidity 1;Comorbidity 2    Comorbidities  RA, DM.    Examination-Activity Limitations  Dressing    Stability/Clinical Decision Making  Stable/Uncomplicated    Rehab Potential  Excellent    PT Frequency  2x / week    PT Duration  8 weeks    PT Treatment/Interventions  ADLs/Self Care Home Management;Cryotherapy;Electrical Stimulation;Ultrasound;Moist Heat;Therapeutic activities;Therapeutic exercise;Manual techniques;Patient/family education;Passive range of motion;Vasopneumatic Device;Iontophoresis 15m/ml Dexamethasone    PT Next Visit Plan  Continue with AAROM/AROM progression per protocol.    PT Home Exercise Plan  HEP- pendulums, closed chain flexion stretch, AAROM ER    Consulted and Agree with Plan of Care  Patient       Patient will benefit from skilled  therapeutic intervention in order to improve the following deficits and impairments:  Pain, Decreased activity tolerance, Decreased range of motion  Visit Diagnosis: Acute pain of left shoulder  Stiffness of left shoulder, not elsewhere classified     Problem List Patient Active Problem List   Diagnosis Date Noted  . S/P left rotator cuff repair/ biceps tenodesis 02/15/19  03/01/2019  . Labral tear of long head of left biceps tendon   . Partial tear of left subscapularis tendon   . Traumatic complete tear of left rotator cuff   . Family history of malignant neoplasm of prostate 07/28/2018  . History of Graves' disease 11/11/2017  . S/P total knee replacement, right 02/02/15 08/12/2017  . S/P total knee replacement, left 07/24/16 08/12/2017  . Hypercholesterolemia 11/08/2015  . History of DVT (deep vein thrombosis) 11/04/2015  . History of cocaine abuse ([place]) 11/04/2015  . History of heroin  abuse ([place]) 11/04/2015  . Obesity 11/04/2015  . Parasomnia 11/04/2015  . Benign paroxysmal positional vertigo 10/29/2015  . Controlled type 2 diabetes mellitus without complication ([place]) 73/42/8768  . Elevated prostate specific antigen (PSA) 10/29/2015  . HX: long term anticoagulant use 10/29/2015  . Sensorineural hearing loss 10/29/2015  . Sleep apnea with use of continuous positive airway pressure (CPAP) 10/29/2015  . Arthrofibrosis of total knee arthroplasty ([place])   . Pulmonary embolus ([place]) 01/09/2015  . UTI (lower urinary tract infection) 02/21/2014  . Other headache syndrome 02/21/2014  . Nausea with vomiting 02/21/2014  . Rheumatoid arthritis ([place]) 02/21/2014  . Nausea & vomiting 02/21/2014  . Headache(784.0) 02/21/2014  . Lumbar spinal stenosis 02/10/2014    Standley Brooking, PTA 06/08/19 9:32 AM   [facility] [address] [place], [place], [address] Phone: [phone]   Fax:  [phone]  Name: Willie Howe MRN: [id number] Date of Birth: Dec 14, 1960

## 2019-06-08 NOTE — Progress Notes (Signed)
Chief Complaint  Patient presents with  . Follow-up    Recheck on left shoulder, DOS 02-15-19.   [name] is 58 years old status post left rotator cuff repair about 4 months ago he complains of aching throbbing pain somewhat during the day but more at night and feels crunching when he raises his arm  He has regained his forward flexion abduction and has good control of his arm  Review of systems he has no symptoms  He is awake alert and oriented x3 his mood and affect are normal Exam shows the incision is healed but when he moves his arm he can feel crepitation at the incision especially with internal/external rotation He is neurovascularly intact He has good abduction good flexion good strength and he is regained all of his motion  I injected the area with cortisone  Patient gave consent for injection left shoulder site confirmed injection 40 mg Depo-Medrol 3 cc 1% lidocaine  Patient advised to continue home exercises for strengthening and follow-up in 5 weeks  Meds ordered this encounter  Medications  . HYDROcodone-acetaminophen (NOR[place]) 7.5-325 MG tablet    Sig: Take 1 tablet by mouth every 8 (eight) hours as needed for moderate pain.    Dispense:  42 tablet    Refill:  0

## 2019-06-15 ENCOUNTER — Encounter: Payer: Medicare HMO | Admitting: Physical Therapy

## 2019-06-21 ENCOUNTER — Other Ambulatory Visit: Payer: Self-pay

## 2019-06-21 DIAGNOSIS — Z9889 Other specified postprocedural states: Secondary | ICD-10-CM

## 2019-06-21 MED ORDER — HYDROCODONE-ACETAMINOPHEN 7.5-325 MG PO TABS: 1.0000 | ORAL_TABLET | Freq: Three times a day (TID) | ORAL | 0 refills | Status: DC | PRN

## 2019-07-05 ENCOUNTER — Other Ambulatory Visit: Payer: Self-pay

## 2019-07-05 DIAGNOSIS — Z9889 Other specified postprocedural states: Secondary | ICD-10-CM

## 2019-07-05 MED ORDER — HYDROCODONE-ACETAMINOPHEN 7.5-325 MG PO TABS: 1.0000 | ORAL_TABLET | Freq: Three times a day (TID) | ORAL | 0 refills | Status: DC | PRN

## 2019-07-11 DIAGNOSIS — M0589 Other rheumatoid arthritis with rheumatoid factor of multiple sites: Secondary | ICD-10-CM | POA: Diagnosis not present

## 2019-07-18 ENCOUNTER — Other Ambulatory Visit: Payer: Self-pay

## 2019-07-18 DIAGNOSIS — R69 Illness, unspecified: Secondary | ICD-10-CM | POA: Diagnosis not present

## 2019-07-18 DIAGNOSIS — Z9889 Other specified postprocedural states: Secondary | ICD-10-CM

## 2019-07-18 MED ORDER — HYDROCODONE-ACETAMINOPHEN 7.5-325 MG PO TABS: 1.0000 | ORAL_TABLET | Freq: Three times a day (TID) | ORAL | 0 refills | Status: DC | PRN

## 2019-07-20 ENCOUNTER — Encounter: Payer: Self-pay | Admitting: Orthopedic Surgery

## 2019-07-20 ENCOUNTER — Ambulatory Visit (INDEPENDENT_AMBULATORY_CARE_PROVIDER_SITE_OTHER): Payer: Medicare HMO | Admitting: Orthopedic Surgery

## 2019-07-20 ENCOUNTER — Other Ambulatory Visit: Payer: Self-pay

## 2019-07-20 VITALS — BP 152/88 | HR 80 | Temp 97.5°F | Ht 75.0 in | Wt 286.8 lb

## 2019-07-20 DIAGNOSIS — Z9889 Other specified postprocedural states: Secondary | ICD-10-CM

## 2019-07-20 NOTE — Patient Instructions (Signed)
Continue current medication for severe pain

## 2019-07-20 NOTE — Progress Notes (Signed)
Chief Complaint  Patient presents with  . Follow-up    Post op RCR Left DOS 02/15/19    [name] had rotator cuff repair on 2019-02-15 he is doing well except he has a crunching noise right at the surgical site which did improve with injection but still bothersome he can sleep on his left side he does have excellent forward elevation and good strength in his shoulder range of motion is excellent  Recommend observation at this point I do not think it would be prudent to go back in the shoulder at this time but if it persists at a year then a debridement might be in order  He can continue his hydrocodone on a every 8 hour basis for pain at a 7 out of 10 or greater  I will see him around mid January  Encounter Diagnosis  Name Primary?  . S/P left rotator cuff repair/ biceps tenodesis 02/15/19  Yes

## 2019-07-29 ENCOUNTER — Other Ambulatory Visit: Payer: Self-pay

## 2019-07-29 DIAGNOSIS — Z9889 Other specified postprocedural states: Secondary | ICD-10-CM

## 2019-08-01 DIAGNOSIS — Z8042 Family history of malignant neoplasm of prostate: Secondary | ICD-10-CM | POA: Diagnosis not present

## 2019-08-01 DIAGNOSIS — R972 Elevated prostate specific antigen [PSA]: Secondary | ICD-10-CM | POA: Diagnosis not present

## 2019-08-01 MED ORDER — HYDROCODONE-ACETAMINOPHEN 7.5-325 MG PO TABS: 1.0000 | ORAL_TABLET | Freq: Three times a day (TID) | ORAL | 0 refills | Status: DC | PRN

## 2019-08-15 ENCOUNTER — Other Ambulatory Visit: Payer: Self-pay

## 2019-08-15 DIAGNOSIS — Z9889 Other specified postprocedural states: Secondary | ICD-10-CM

## 2019-08-15 MED ORDER — HYDROCODONE-ACETAMINOPHEN 7.5-325 MG PO TABS: 1.0000 | ORAL_TABLET | Freq: Three times a day (TID) | ORAL | 0 refills | Status: DC | PRN

## 2019-08-28 ENCOUNTER — Other Ambulatory Visit: Payer: Self-pay

## 2019-08-28 DIAGNOSIS — Z9889 Other specified postprocedural states: Secondary | ICD-10-CM

## 2019-08-29 MED ORDER — HYDROCODONE-ACETAMINOPHEN 7.5-325 MG PO TABS: 1.0000 | ORAL_TABLET | Freq: Three times a day (TID) | ORAL | 0 refills | Status: DC | PRN

## 2019-08-31 ENCOUNTER — Ambulatory Visit: Payer: Medicare HMO | Admitting: Orthopedic Surgery

## 2019-08-31 ENCOUNTER — Encounter: Payer: Self-pay | Admitting: Orthopedic Surgery

## 2019-08-31 ENCOUNTER — Other Ambulatory Visit: Payer: Self-pay

## 2019-08-31 VITALS — BP 140/78 | HR 89 | Ht 75.0 in | Wt 280.0 lb

## 2019-08-31 DIAGNOSIS — M25512 Pain in left shoulder: Secondary | ICD-10-CM

## 2019-08-31 DIAGNOSIS — Z9889 Other specified postprocedural states: Secondary | ICD-10-CM | POA: Diagnosis not present

## 2019-08-31 DIAGNOSIS — I10 Essential (primary) hypertension: Secondary | ICD-10-CM | POA: Insufficient documentation

## 2019-08-31 DIAGNOSIS — G8929 Other chronic pain: Secondary | ICD-10-CM

## 2019-08-31 NOTE — Progress Notes (Signed)
Chief Complaint  Patient presents with  . Shoulder Pain    left/ has gotten worse had RCR 02/15/19    59 year old male had a rotator cuff repair left shoulder June 2020 he has experienced significant crepitance in the shoulder received a cortisone injection anteriorly near the incision which helped with the pain at that site but he continues with crepitance dull aching throbbing pain still requiring opioids.  Was using Norco 7.5mg  every 8 hours as needed for pain 7 out of 10 or greater  He denies any discomfort when he is actually moving the arm or elevating it  He has no tenderness to palpation but has crepitance on range of motion with popping seems to be coming from the repair site  His active abduction is 90 degrees flexion is 120 degrees his drop test is normal  I gave him an lidocaine injection test  Repeat examination  Plan  Encounter Diagnoses  Name Primary?  . S/P left rotator cuff repair/ biceps tenodesis 02/15/19  Yes  . Chronic pain in left shoulder     Procedure patient gave consent circumflex site was confirmed and we did 9 cc of 1% lidocaine and 40 mg Depo-Medrol subacromial space there were no complications  Post lidocaine injection test no change in range of motion  Recommend surgical debridement left shoulder arthroscopically at the patient's convenience  Summation chronic pain with exacerbation  Injection  Plan for surgery elective

## 2019-08-31 NOTE — Patient Instructions (Signed)
Diagnosis postoperative scarring and pain after rotator cuff repair left shoulder  Lidocaine injection test revealed that the cuff repair seems to be intact  Please give [place] a call when you are ready to schedule surgery left shoulder  We will plan to do an arthroscopic debridement of the shoulder

## 2019-09-02 ENCOUNTER — Telehealth: Payer: Self-pay | Admitting: Orthopedic Surgery

## 2019-09-02 NOTE — Telephone Encounter (Signed)
Patient called to discuss a date for surgery as discussed at office visit 08/31/2019; states he is considering Thursday, 10/13/19. Please advise.

## 2019-09-05 ENCOUNTER — Other Ambulatory Visit: Payer: Self-pay | Admitting: Orthopedic Surgery

## 2019-09-05 NOTE — Telephone Encounter (Signed)
arthroscopic debridement of the  Left shoulder  Called him discussed 10/13/19 open, have put in orders .

## 2019-09-08 ENCOUNTER — Other Ambulatory Visit: Payer: Self-pay | Admitting: Orthopedic Surgery

## 2019-09-08 DIAGNOSIS — S46012A Strain of muscle(s) and tendon(s) of the rotator cuff of left shoulder, initial encounter: Secondary | ICD-10-CM

## 2019-09-11 ENCOUNTER — Other Ambulatory Visit: Payer: Self-pay

## 2019-09-11 DIAGNOSIS — Z9889 Other specified postprocedural states: Secondary | ICD-10-CM

## 2019-09-12 MED ORDER — HYDROCODONE-ACETAMINOPHEN 7.5-325 MG PO TABS: 1.0000 | ORAL_TABLET | Freq: Three times a day (TID) | ORAL | 0 refills | Status: DC | PRN

## 2019-09-25 ENCOUNTER — Other Ambulatory Visit: Payer: Self-pay

## 2019-09-25 DIAGNOSIS — Z9889 Other specified postprocedural states: Secondary | ICD-10-CM

## 2019-09-26 MED ORDER — HYDROCODONE-ACETAMINOPHEN 7.5-325 MG PO TABS: 1.0000 | ORAL_TABLET | Freq: Three times a day (TID) | ORAL | 0 refills | Status: DC | PRN

## 2019-10-09 ENCOUNTER — Other Ambulatory Visit: Payer: Self-pay

## 2019-10-09 DIAGNOSIS — Z9889 Other specified postprocedural states: Secondary | ICD-10-CM

## 2019-10-10 ENCOUNTER — Other Ambulatory Visit: Payer: Self-pay | Admitting: Orthopedic Surgery

## 2019-10-10 ENCOUNTER — Telehealth: Payer: Self-pay | Admitting: Radiology

## 2019-10-10 MED ORDER — HYDROCODONE-ACETAMINOPHEN 7.5-325 MG PO TABS: 1.0000 | ORAL_TABLET | Freq: Three times a day (TID) | ORAL | 0 refills | Status: DC | PRN

## 2019-10-10 NOTE — Patient Instructions (Signed)
Willie Howe  10/10/2019     @PREFPERIOPPHARMACY @   Your procedure is scheduled on   10/10/2019   Report to [facility] at   [place]  A.M.  Call this number if you have problems the morning of surgery:  [phone]   Remember:  Do not eat or drink after midnight.                    Take these medicines the morning of surgery with A SIP OF WATER zyrtec, hydrocodone(if needed), losartan, prednisone. DO NOT take any medications for diabetes the morning of your procedure.    Do not wear jewelry, make-up or nail polish.  Do not wear lotions, powders, or perfumes. Please wear deodorant and brush your teeth.  Do not shave 48 hours prior to surgery.  Men may shave face and neck.  Do not bring valuables to the hospital.  [facility] is not responsible for any belongings or valuables.  Contacts, dentures or bridgework may not be worn into surgery.  Leave your suitcase in the car.  After surgery it may be brought to your room.  For patients admitted to the hospital, discharge time will be determined by your treatment team.  Patients discharged the day of surgery will not be allowed to drive home.   Name and phone number of your driver:   family Special instructions:  DO NOT smoke the morning of your surgery.  Please read over the following fact sheets that you were given. Anesthesia Post-op Instructions and Care and Recovery After Surgery       Shoulder Arthroscopy, Care After This sheet gives you information about how to care for yourself after your procedure. Your health care provider may also give you more specific instructions. If you have problems or questions, contact your health care provider. What can I expect after the procedure? After the procedure, it is common to have:  Pain that can be relieved by taking pain medicine.  Swelling.  A small amount of fluid from the incision.  Stiffness that improves over time. Follow these instructions at home: If you  have a sling or immobilizer:  Wear the sling or immobilizer as told by your health care provider. Remove it only as told by your health care provider. These devices protect your shoulder and help it heal by keeping it in place.  Loosen the sling or immobilizer if your fingers tingle, become numb, or turn cold and blue.  Keep the sling or immobilizer clean.  Ask if you may remove the sling or immobilizer for bathing. If you need to keep it on while bathing and it is not waterproof: ? Do not let it get wet. ? Cover it with a watertight covering when you take a bath or a shower. Incision care   Follow instructions from your health care provider about how to take care of your incisions. Make sure you: ? Wash your hands with soap and water before you change your bandage (dressing). If soap and water are not available, use hand sanitizer. ? Change your dressing as told by your health care provider. ? Leave stitches (sutures), staples, skin glue, or adhesive strips in place. These skin closures may need to stay in place for 2 weeks or longer. If adhesive strip edges start to loosen and curl up, you may trim the loose edges. Do not remove adhesive strips completely unless your health care provider tells you to  do that.  Check your incision areas every day for signs of infection. Check for: ? Redness ? More swelling or pain. ? Blood or more fluid. ? Warmth. ? Pus or a bad smell. Bathing  Do not take baths, swim, or use a hot tub until your health care provider approves. Ask your health care provider if you may take showers. You may only be allowed to take sponge baths. Activity  Ask your health care provider what activities are safe for you during recovery, and ask what activities you need to avoid.  Do not lift with your affected shoulder until your health care provider approves.  Avoid pulling and pushing with the arm on your affected side.  If physical therapy was prescribed, do  exercises as directed. Doing exercises may help to improve shoulder movement and flexibility (range of motion). Driving  Do not drive until your health care provider approves.  Do not drive or use heavy machinery while taking prescription pain medicine. Managing pain, stiffness, and swelling   If lying down flat causes shoulder discomfort, it may help to sleep in a sitting position for a few days after your procedure. Try sleeping in a reclining chair or propping yourself up with extra pillows in bed.  If directed, put ice on the affected area: ? Put ice in a plastic bag or use the icing device (cold therapy unit) that you were given. Follow instructions from your health care provider about how to use the icing device. ? Place a towel between your skin and the bag or between your skin and the icing device. ? Leave the ice on for 20 minutes, 2-3 times a day.  Move your fingers often to avoid stiffness and to lessen swelling. General instructions  Take over-the-counter and prescription medicines only as told by your health care provider.  If you are taking prescription pain medicine, take actions to prevent or treat constipation. Your health care provider may recommend that you: ? Drink enough fluid to keep your urine pale yellow. ? Eat foods that are high in fiber, such as fresh fruits and vegetables, whole grains, and beans. ? Limit foods that are high in fat and processed sugars, such as fried or sweet foods. ? Take an over-the-counter or prescription medicine for constipation.  Do not use any products that contain nicotine or tobacco, such as cigarettes and e-cigarettes. These can delay incision or bone healing. If you need help quitting, ask your health care provider.  Keep all follow-up visits as told by your health care provider. This is important. Contact a health care provider if you:  Have a fever.  Have severe pain.  Have redness around an incision.  Have more swelling  or pain in an incision area.  Have blood or more fluid coming from an incision.  Notice that an incision feels warm to the touch.  Notice pus or a bad smell coming from an incision.  Notice that an incision has opened up.  Develop a rash. Get help right away if you:  Have difficulty breathing.  Have chest pain.  Notice that your fingers tingle, are numb, or are cold and blue even after you loosen your sling or immobilizer.  Develop pain in your lower leg or at the back of your knee. Summary  If you have a sling or immobilizer, wear it as told by your health care provider. These devices protect your shoulder and help it heal by keeping it in place.  If lying down flat  causes shoulder discomfort, it may help to sleep in a sitting position for a few days after your procedure. Try sleeping in a reclining chair, or try propping yourself up with extra pillows in bed.  If physical therapy was prescribed, do exercises as directed. Doing exercises may help to improve shoulder movement and flexibility (range of motion).  Keep all follow-up visits as told by your health care provider. This is important. This information is not intended to replace advice given to you by your health care provider. Make sure you discuss any questions you have with your health care provider. Document Revised: 07/17/2017 Document Reviewed: 06/19/2017 Elsevier Patient Education  2020 [place] Anesthesia, Adult, Care After This sheet gives you information about how to care for yourself after your procedure. Your health care provider may also give you more specific instructions. If you have problems or questions, contact your health care provider. What can I expect after the procedure? After the procedure, the following side effects are common:  Pain or discomfort at the IV site.  Nausea.  Vomiting.  Sore throat.  Trouble concentrating.  Feeling cold or chills.  Weak or  tired.  Sleepiness and fatigue.  Soreness and body aches. These side effects can affect parts of the body that were not involved in surgery. Follow these instructions at home:  For at least 24 hours after the procedure:  Have a responsible adult stay with you. It is important to have someone help care for you until you are awake and alert.  Rest as needed.  Do not: ? Participate in activities in which you could fall or become injured. ? Drive. ? Use heavy machinery. ? Drink alcohol. ? Take sleeping pills or medicines that cause drowsiness. ? Make important decisions or sign legal documents. ? Take care of children on your own. Eating and drinking  Follow any instructions from your health care provider about eating or drinking restrictions.  When you feel hungry, start by eating small amounts of foods that are soft and easy to digest (bland), such as toast. Gradually return to your regular diet.  Drink enough fluid to keep your urine pale yellow.  If you vomit, rehydrate by drinking water, juice, or clear broth. General instructions  If you have sleep apnea, surgery and certain medicines can increase your risk for breathing problems. Follow instructions from your health care provider about wearing your sleep device: ? Anytime you are sleeping, including during daytime naps. ? While taking prescription pain medicines, sleeping medicines, or medicines that make you drowsy.  Return to your normal activities as told by your health care provider. Ask your health care provider what activities are safe for you.  Take over-the-counter and prescription medicines only as told by your health care provider.  If you smoke, do not smoke without supervision.  Keep all follow-up visits as told by your health care provider. This is important. Contact a health care provider if:  You have nausea or vomiting that does not get better with medicine.  You cannot eat or drink without  vomiting.  You have pain that does not get better with medicine.  You are unable to pass urine.  You develop a skin rash.  You have a fever.  You have redness around your IV site that gets worse. Get help right away if:  You have difficulty breathing.  You have chest pain.  You have blood in your urine or stool, or you vomit blood. Summary  After the  procedure, it is common to have a sore throat or nausea. It is also common to feel tired.  Have a responsible adult stay with you for the first 24 hours after general anesthesia. It is important to have someone help care for you until you are awake and alert.  When you feel hungry, start by eating small amounts of foods that are soft and easy to digest (bland), such as toast. Gradually return to your regular diet.  Drink enough fluid to keep your urine pale yellow.  Return to your normal activities as told by your health care provider. Ask your health care provider what activities are safe for you. This information is not intended to replace advice given to you by your health care provider. Make sure you discuss any questions you have with your health care provider. Document Revised: 08/07/2017 Document Reviewed: 03/20/2017 Elsevier Patient Education  [place]. How to Use Chlorhexidine for Bathing Chlorhexidine gluconate (CHG) is a germ-killing (antiseptic) solution that is used to clean the skin. It can get rid of the bacteria that normally live on the skin and can keep them away for about 24 hours. To clean your skin with CHG, you may be given:  A CHG solution to use in the shower or as part of a sponge bath.  A prepackaged cloth that contains CHG. Cleaning your skin with CHG may help lower the risk for infection:  While you are staying in the intensive care unit of the hospital.  If you have a vascular access, such as a central line, to provide short-term or long-term access to your veins.  If you have a catheter  to drain urine from your bladder.  If you are on a ventilator. A ventilator is a machine that helps you breathe by moving air in and out of your lungs.  After surgery. What are the risks? Risks of using CHG include:  A skin reaction.  Hearing loss, if CHG gets in your ears.  Eye injury, if CHG gets in your eyes and is not rinsed out.  The CHG product catching fire. Make sure that you avoid smoking and flames after applying CHG to your skin. Do not use CHG:  If you have a chlorhexidine allergy or have previously reacted to chlorhexidine.  On babies younger than 47 months of age. How to use CHG solution  Use CHG only as told by your health care provider, and follow the instructions on the label.  Use the full amount of CHG as directed. Usually, this is one bottle. During a shower Follow these steps when using CHG solution during a shower (unless your health care provider gives you different instructions): 1. Start the shower. 2. Use your normal soap and shampoo to wash your face and hair. 3. Turn off the shower or move out of the shower stream. 4. Pour the CHG onto a clean washcloth. Do not use any type of brush or rough-edged sponge. 5. Starting at your neck, lather your body down to your toes. Make sure you follow these instructions: ? If you will be having surgery, pay special attention to the part of your body where you will be having surgery. Scrub this area for at least 1 minute. ? Do not use CHG on your head or face. If the solution gets into your ears or eyes, rinse them well with water. ? Avoid your genital area. ? Avoid any areas of skin that have broken skin, cuts, or scrapes. ? Scrub your back  and under your arms. Make sure to wash skin folds. 6. Let the lather sit on your skin for 1-2 minutes or as long as told by your health care provider. 7. Thoroughly rinse your entire body in the shower. Make sure that all body creases and crevices are rinsed well. 8. Dry off  with a clean towel. Do not put any substances on your body afterward--such as powder, lotion, or perfume--unless you are told to do so by your health care provider. Only use lotions that are recommended by the manufacturer. 9. Put on clean clothes or pajamas. 10. If it is the night before your surgery, sleep in clean sheets.  During a sponge bath Follow these steps when using CHG solution during a sponge bath (unless your health care provider gives you different instructions): 1. Use your normal soap and shampoo to wash your face and hair. 2. Pour the CHG onto a clean washcloth. 3. Starting at your neck, lather your body down to your toes. Make sure you follow these instructions: ? If you will be having surgery, pay special attention to the part of your body where you will be having surgery. Scrub this area for at least 1 minute. ? Do not use CHG on your head or face. If the solution gets into your ears or eyes, rinse them well with water. ? Avoid your genital area. ? Avoid any areas of skin that have broken skin, cuts, or scrapes. ? Scrub your back and under your arms. Make sure to wash skin folds. 4. Let the lather sit on your skin for 1-2 minutes or as long as told by your health care provider. 5. Using a different clean, wet washcloth, thoroughly rinse your entire body. Make sure that all body creases and crevices are rinsed well. 6. Dry off with a clean towel. Do not put any substances on your body afterward--such as powder, lotion, or perfume--unless you are told to do so by your health care provider. Only use lotions that are recommended by the manufacturer. 7. Put on clean clothes or pajamas. 8. If it is the night before your surgery, sleep in clean sheets. How to use CHG prepackaged cloths  Only use CHG cloths as told by your health care provider, and follow the instructions on the label.  Use the CHG cloth on clean, dry skin.  Do not use the CHG cloth on your head or face unless  your health care provider tells you to.  When washing with the CHG cloth: ? Avoid your genital area. ? Avoid any areas of skin that have broken skin, cuts, or scrapes. Before surgery Follow these steps when using a CHG cloth to clean before surgery (unless your health care provider gives you different instructions): 1. Using the CHG cloth, vigorously scrub the part of your body where you will be having surgery. Scrub using a back-and-forth motion for 3 minutes. The area on your body should be completely wet with CHG when you are done scrubbing. 2. Do not rinse. Discard the cloth and let the area air-dry. Do not put any substances on the area afterward, such as powder, lotion, or perfume. 3. Put on clean clothes or pajamas. 4. If it is the night before your surgery, sleep in clean sheets.  For general bathing Follow these steps when using CHG cloths for general bathing (unless your health care provider gives you different instructions). 1. Use a separate CHG cloth for each area of your body. Make sure you wash between  any folds of skin and between your fingers and toes. Wash your body in the following order, switching to a new cloth after each step: ? The front of your neck, shoulders, and chest. ? Both of your arms, under your arms, and your hands. ? Your stomach and groin area, avoiding the genitals. ? Your right leg and foot. ? Your left leg and foot. ? The back of your neck, your back, and your buttocks. 2. Do not rinse. Discard the cloth and let the area air-dry. Do not put any substances on your body afterward--such as powder, lotion, or perfume--unless you are told to do so by your health care provider. Only use lotions that are recommended by the manufacturer. 3. Put on clean clothes or pajamas. Contact a health care provider if:  Your skin gets irritated after scrubbing.  You have questions about using your solution or cloth. Get help right away if:  Your eyes become very red or  swollen.  Your eyes itch badly.  Your skin itches badly and is red or swollen.  Your hearing changes.  You have trouble seeing.  You have swelling or tingling in your mouth or throat.  You have trouble breathing.  You swallow any chlorhexidine. Summary  Chlorhexidine gluconate (CHG) is a germ-killing (antiseptic) solution that is used to clean the skin. Cleaning your skin with CHG may help to lower your risk for infection.  You may be given CHG to use for bathing. It may be in a bottle or in a prepackaged cloth to use on your skin. Carefully follow your health care provider's instructions and the instructions on the product label.  Do not use CHG if you have a chlorhexidine allergy.  Contact your health care provider if your skin gets irritated after scrubbing. This information is not intended to replace advice given to you by your health care provider. Make sure you discuss any questions you have with your health care provider. Document Revised: 10/21/2018 Document Reviewed: 07/02/2017 Elsevier Patient Education  [place].

## 2019-10-10 NOTE — Telephone Encounter (Signed)
Authorization not required for surgery [address]  Participating providers not required.

## 2019-10-11 ENCOUNTER — Other Ambulatory Visit: Payer: Self-pay

## 2019-10-11 ENCOUNTER — Other Ambulatory Visit (HOSPITAL_COMMUNITY)
Admission: RE | Admit: 2019-10-11 | Discharge: 2019-10-11 | Disposition: A | Discharge status: Home / Self Care | Payer: Medicare HMO | Source: Ambulatory Visit | Attending: Orthopedic Surgery | Admitting: Orthopedic Surgery

## 2019-10-11 ENCOUNTER — Encounter (HOSPITAL_COMMUNITY)
Admission: RE | Admit: 2019-10-11 | Discharge: 2019-10-11 | Disposition: A | Discharge status: Home / Self Care | Payer: Medicare HMO | Source: Ambulatory Visit | Attending: Orthopedic Surgery | Admitting: Orthopedic Surgery

## 2019-10-11 ENCOUNTER — Encounter (HOSPITAL_COMMUNITY): Payer: Self-pay

## 2019-10-11 ENCOUNTER — Encounter (HOSPITAL_COMMUNITY): Payer: Self-pay | Admitting: Anesthesiology

## 2019-10-11 DIAGNOSIS — Z01810 Encounter for preprocedural cardiovascular examination: Secondary | ICD-10-CM | POA: Insufficient documentation

## 2019-10-11 DIAGNOSIS — U071 COVID-19: Secondary | ICD-10-CM | POA: Diagnosis not present

## 2019-10-11 DIAGNOSIS — Z01812 Encounter for preprocedural laboratory examination: Secondary | ICD-10-CM | POA: Diagnosis not present

## 2019-10-11 LAB — CBC WITH DIFFERENTIAL/PLATELET
Abs Immature Granulocytes: 0.03 10*3/uL (ref 0.00–0.07)
Basophils Absolute: 0 10*3/uL (ref 0.0–0.1)
Basophils Relative: 0 %
Eosinophils Absolute: 0.1 10*3/uL (ref 0.0–0.5)
Eosinophils Relative: 1 %
HCT: 41.1 % (ref 39.0–52.0)
Hemoglobin: 13.2 g/dL (ref 13.0–17.0)
Immature Granulocytes: 0 %
Lymphocytes Relative: 17 %
Lymphs Abs: 1.4 10*3/uL (ref 0.7–4.0)
MCH: 30.3 pg (ref 26.0–34.0)
MCHC: 32.1 g/dL (ref 30.0–36.0)
MCV: 94.3 fL (ref 80.0–100.0)
Monocytes Absolute: 0.5 10*3/uL (ref 0.1–1.0)
Monocytes Relative: 6 %
Neutro Abs: 6.3 10*3/uL (ref 1.7–7.7)
Neutrophils Relative %: 76 %
Platelets: 289 10*3/uL (ref 150–400)
RBC: 4.36 MIL/uL (ref 4.22–5.81)
RDW: 14.6 % (ref 11.5–15.5)
WBC: 8.3 10*3/uL (ref 4.0–10.5)
nRBC: 0 % (ref 0.0–0.2)

## 2019-10-11 LAB — BASIC METABOLIC PANEL
Anion gap: 8 (ref 5–15)
BUN: 13 mg/dL (ref 6–20)
CO2: 25 mmol/L (ref 22–32)
Calcium: 9 mg/dL (ref 8.9–10.3)
Chloride: 106 mmol/L (ref 98–111)
Creatinine, Ser: 0.91 mg/dL (ref 0.61–1.24)
GFR calc Af Amer: >60 mL/min (ref >60)
GFR calc non Af Amer: >60 mL/min (ref >60)
Glucose, Bld: 130 mg/dL — ABNORMAL HIGH (ref 70–99)
Potassium: 3.8 mmol/L (ref 3.5–5.1)
Sodium: 139 mmol/L (ref 135–145)

## 2019-10-11 LAB — HEMOGLOBIN A1C
Hgb A1c MFr Bld: 6.5 % — ABNORMAL HIGH (ref 4.8–5.6)
Mean Plasma Glucose: 139.85 mg/dL

## 2019-10-11 LAB — SARS CORONAVIRUS 2 (TAT 6-24 HRS): SARS Coronavirus 2: POSITIVE — AB

## 2019-10-12 ENCOUNTER — Telehealth: Payer: Self-pay | Admitting: Orthopedic Surgery

## 2019-10-12 ENCOUNTER — Ambulatory Visit (HOSPITAL_COMMUNITY)
Admission: RE | Admit: 2019-10-12 | Discharge: 2019-10-12 | Disposition: A | Discharge status: Home / Self Care | Payer: Medicare Other | Source: Ambulatory Visit | Attending: Pulmonary Disease | Admitting: Pulmonary Disease

## 2019-10-12 ENCOUNTER — Other Ambulatory Visit: Payer: Self-pay | Admitting: Internal Medicine

## 2019-10-12 DIAGNOSIS — Z23 Encounter for immunization: Secondary | ICD-10-CM | POA: Diagnosis not present

## 2019-10-12 DIAGNOSIS — I10 Essential (primary) hypertension: Secondary | ICD-10-CM | POA: Insufficient documentation

## 2019-10-12 DIAGNOSIS — U071 COVID-19: Secondary | ICD-10-CM | POA: Diagnosis present

## 2019-10-12 DIAGNOSIS — E119 Type 2 diabetes mellitus without complications: Secondary | ICD-10-CM

## 2019-10-12 MED ORDER — METHYLPREDNISOLONE SODIUM SUCC 125 MG IJ SOLR: 125.0000 mg | Freq: Once | INTRAMUSCULAR | Status: DC | PRN

## 2019-10-12 MED ORDER — DIPHENHYDRAMINE HCL 50 MG/ML IJ SOLN: 50.0000 mg | Freq: Once | INTRAMUSCULAR | Status: DC | PRN

## 2019-10-12 MED ORDER — FAMOTIDINE IN NACL 20-0.9 MG/50ML-% IV SOLN: 20.0000 mg | Freq: Once | INTRAVENOUS | Status: DC | PRN

## 2019-10-12 MED ORDER — SODIUM CHLORIDE 0.9 % IV SOLN: INTRAVENOUS | Status: DC | PRN

## 2019-10-12 MED ORDER — SODIUM CHLORIDE 0.9 % IV SOLN
700.0000 mg | Freq: Once | INTRAVENOUS | Status: AC
  Administered 2019-10-12: 15:00:00 700 mg via INTRAVENOUS
  Filled 2019-10-12: qty 20

## 2019-10-12 MED ORDER — EPINEPHRINE 0.3 MG/0.3ML IJ SOAJ: 0.3000 mg | Freq: Once | INTRAMUSCULAR | Status: DC | PRN

## 2019-10-12 MED ORDER — ALBUTEROL SULFATE HFA 108 (90 BASE) MCG/ACT IN AERS: 2.0000 | INHALATION_SPRAY | Freq: Once | RESPIRATORY_TRACT | Status: DC | PRN

## 2019-10-12 NOTE — Telephone Encounter (Signed)
I called patient, he is aware of positive COVID test, and will call his PCP for instruction.  He is aware we will call him in 21 days to reschedule.  Sending to you as Juluis Rainier

## 2019-10-12 NOTE — Telephone Encounter (Signed)
Send to Salvatore Marvel can you call this patient let him know his test was positive  He needs to call his primary care doctor   We will call him to reschedule in 21 days

## 2019-10-12 NOTE — Telephone Encounter (Signed)
I received a call from [place] at [facility] this morning.  She said this patient tested positive for Covid therefore his surgery for tomorrow needs to be canceled.    She said that we are responsible for letting the patient know of this situation.  Pamala Hurry also stated that they have texted Dr Aline Brochure regarding this.

## 2019-10-12 NOTE — Progress Notes (Unsigned)
  I connected by phone with Willie Howe on 10/12/2019 at 11:08 AM to discuss the potential use of an new treatment for mild to moderate COVID-19 viral infection in non-hospitalized patients.  This patient is a 59 y.o. male that meets the FDA criteria for Emergency Use Authorization of bamlanivimab or casirivimab\imdevimab.  Has a (+) direct SARS-CoV-2 viral test result  Has mild or moderate COVID-19   Is ? 59 years of age and weighs ? 40 kg  Is NOT hospitalized due to COVID-19  Is NOT requiring oxygen therapy or requiring an increase in baseline oxygen flow rate due to COVID-19  Is within 10 days of symptom onset  Has at least one of the high risk factor(s) for progression to severe COVID-19 and/or hospitalization as defined in EUA.  Specific high risk criteria : Diabetes, htn, BMI >35   I have spoken and communicated the following to the patient or parent/caregiver:  1. FDA has authorized the emergency use of bamlanivimab and casirivimab\imdevimab for the treatment of mild to moderate COVID-19 in adults and pediatric patients with positive results of direct SARS-CoV-2 viral testing who are 47 years of age and older weighing at least 40 kg, and who are at high risk for progressing to severe COVID-19 and/or hospitalization.  2. The significant known and potential risks and benefits of bamlanivimab and casirivimab\imdevimab, and the extent to which such potential risks and benefits are unknown.  3. Information on available alternative treatments and the risks and benefits of those alternatives, including clinical trials.  4. Patients treated with bamlanivimab and casirivimab\imdevimab should continue to self-isolate and use infection control measures (e.g., wear mask, isolate, social distance, avoid sharing personal items, clean and disinfect "high touch" surfaces, and frequent handwashing) according to CDC guidelines.   5. The patient or parent/caregiver has the option to accept or  refuse bamlanivimab or casirivimab\imdevimab .  After reviewing this information with the patient, The patient agreed to proceed with receiving the bamlanimivab infusion and will be provided a copy of the Fact sheet prior to receiving the infusion.   Infusion scheduled for 2/24 at [place], [facility] [place]  pgr [phone]

## 2019-10-12 NOTE — Progress Notes (Signed)
  Diagnosis: COVID-19  Physician: Dr. Wright  Procedure: [facility] Med: bamlanivimab infusion - Provided patient with bamlanimivab fact sheet for patients, parents and caregivers prior to infusion.  Complications: No immediate complications noted.  Discharge: Discharged home   Willie Howe A 10/12/2019  

## 2019-10-12 NOTE — Discharge Instructions (Signed)

## 2019-10-12 NOTE — OR Nursing (Signed)
Patient told our PAT nurse that he thought he was exposed to [place] in December. He went to the health department and got tested. That result was negative.

## 2019-10-13 ENCOUNTER — Encounter (HOSPITAL_COMMUNITY): Admission: RE | Payer: Self-pay | Source: Home / Self Care

## 2019-10-13 ENCOUNTER — Ambulatory Visit (HOSPITAL_COMMUNITY): Admission: RE | Admit: 2019-10-13 | Payer: Medicare HMO | Source: Home / Self Care | Admitting: Orthopedic Surgery

## 2019-10-13 SURGERY — ARTHROSCOPY, SHOULDER
Anesthesia: General | Site: Shoulder | Laterality: Left

## 2019-10-13 SURGERY — ARTHROSCOPY, SHOULDER
Anesthesia: Choice | Site: Shoulder | Laterality: Left

## 2019-10-17 ENCOUNTER — Telehealth: Payer: Self-pay | Admitting: Radiology

## 2019-10-17 ENCOUNTER — Other Ambulatory Visit: Payer: Self-pay | Admitting: Orthopedic Surgery

## 2019-10-17 NOTE — Telephone Encounter (Signed)
Put in orders again, again.

## 2019-10-17 NOTE — Telephone Encounter (Signed)
I don't think we can at this point.  It will be ok, just add him where you need to, and don't worry about the past one.  If anyone needs to know they can look at the op note for the dos.

## 2019-10-17 NOTE — Telephone Encounter (Signed)
-----   Message from Josue Hector sent at 10/17/2019  1:43 PM EST ----- I do not have him in the depot.  You will need to book the case again.  Please put in special needs what day he tested positive.  Thanks,  Hoyle Sauer ----- Message ----- From: Candice Camp, RT Sent: 10/17/2019   1:05 PM EST To: Victory Dakin Young  His surgery was cancelled last week due to positive covid test Would like to RS to March 25th, does he need new orders?

## 2019-10-17 NOTE — Telephone Encounter (Signed)
I have RS surgery for him  I have questions about pulling him off schedule for 10/13/19, I tried this, and could not. Can you look at it with me when you have a chance?

## 2019-10-23 ENCOUNTER — Other Ambulatory Visit: Payer: Self-pay

## 2019-10-23 DIAGNOSIS — Z9889 Other specified postprocedural states: Secondary | ICD-10-CM

## 2019-10-24 MED ORDER — HYDROCODONE-ACETAMINOPHEN 7.5-325 MG PO TABS: 1.0000 | ORAL_TABLET | Freq: Three times a day (TID) | ORAL | 0 refills | Status: DC | PRN

## 2019-10-31 ENCOUNTER — Other Ambulatory Visit: Payer: Self-pay

## 2019-10-31 DIAGNOSIS — Z9889 Other specified postprocedural states: Secondary | ICD-10-CM

## 2019-10-31 MED ORDER — HYDROCODONE-ACETAMINOPHEN 7.5-325 MG PO TABS: 1.0000 | ORAL_TABLET | Freq: Three times a day (TID) | ORAL | 0 refills | Status: DC | PRN

## 2019-11-05 ENCOUNTER — Other Ambulatory Visit: Payer: Self-pay | Admitting: Orthopedic Surgery

## 2019-11-05 DIAGNOSIS — S46012A Strain of muscle(s) and tendon(s) of the rotator cuff of left shoulder, initial encounter: Secondary | ICD-10-CM

## 2019-11-07 ENCOUNTER — Other Ambulatory Visit: Payer: Self-pay

## 2019-11-07 ENCOUNTER — Encounter (HOSPITAL_COMMUNITY)
Admission: RE | Admit: 2019-11-07 | Discharge: 2019-11-07 | Disposition: A | Discharge status: Home / Self Care | Payer: Medicare HMO | Source: Ambulatory Visit | Attending: Orthopedic Surgery | Admitting: Orthopedic Surgery

## 2019-11-09 NOTE — H&P (Signed)
Chief Complaint  Patient presents with  . Shoulder Pain      left/ has gotten worse had RCR 02/15/19      59 year old male had a rotator cuff repair left shoulder June 2020 he has experienced significant crepitance in the shoulder received a cortisone injection anteriorly near the incision which helped with the pain at that site but he continues with crepitance dull aching throbbing pain still requiring opioids.  Was using Norco 7.5mg  every 8 hours as needed for pain 7 out of 10 or greater   He denies any discomfort when he is actually moving the arm or elevating it     I gave him an lidocaine injection test   Repeat examination no change in range of motion we decided to perform arthroscopic surgery to check the repair try to discern whether crepitance is coming from  Review of systems no recent shortness of breath or chest pain no abdominal problems no urinary symptoms other systems were negative  Past Medical History:  Diagnosis Date  . Anginal pain ([place])   . Arthritis   . BPPV (benign paroxysmal positional vertigo)   . Diabetes mellitus without complication ([place])   . DVT (deep venous thrombosis) ([place])   . Dysrhythmia   . Graves disease 1996  . History of kidney stones   . History of pulmonary embolism   . Obesity   . Previous back surgery    x 3  . Rheumatoid arthritis ([place])   . Sleep apnea   . Thyroid disease    Graves Disease    Past Surgical History:  Procedure Laterality Date  . BACK SURGERY  2015  . COLONOSCOPY    . EXAM UNDER ANESTHESIA WITH MANIPULATION OF KNEE Right 04/18/2015   Procedure: MANIPULATION OF RIGHT KNEE UNDER ANESTHESIA;  Surgeon: Carole Civil, MD;  Location: [facility];  Service: Orthopedics;  Laterality: Right;  . IVC Filter     removed December 2016  . [place]  2010  . KNEE ARTHROSCOPY Right 2006   meniscus   . KNEE ARTHROSCOPY WITH MEDIAL MENISECTOMY Left 04/24/2016   Procedure: LEFT KNEE ARTHROSCOPY WITH PARTIAL MEDIAL  MENISECTOMY;  Surgeon: Carole Civil, MD;  Location: [facility];  Service: Orthopedics;  Laterality: Left;  . KNEE SURGERY     x 2  . KNEE SURGERY Left 1976   ?ligament repair   . LUMBAR WOUND DEBRIDEMENT N/A 02/23/2014   Procedure: LUMBAR WOUND DEBRIDEMENT;  Surgeon: Faythe Ghee, MD;  Location: [facility];  Service: Neurosurgery;  Laterality: N/A;  exploration lumbar wound. repair of dural defect.  Marland Kitchen MENISECTOMY Right    open medial   . NEPHROLITHOTOMY    . PERIPHERAL VASCULAR CATHETERIZATION N/A 01/30/2015   Procedure: IVC Filter Insertion;  Surgeon: Serafina Mitchell, MD;  Location: [facility];  Service: Cardiovascular;  Laterality: N/A;  . PERIPHERAL VASCULAR CATHETERIZATION N/A 04/24/2015   Procedure: IVC Filter Removal;  Surgeon: Serafina Mitchell, MD;  Location: [facility];  Service: Cardiovascular;  Laterality: N/A;  . PERIPHERAL VASCULAR CATHETERIZATION N/A 08/14/2015   Procedure: IVC Filter Removal;  Surgeon: Serafina Mitchell, MD;  Location: [facility];  Service: Cardiovascular;  Laterality: N/A;  . SHOULDER ARTHROSCOPY WITH ROTATOR CUFF REPAIR AND OPEN BICEPS TENODESIS Left 02/15/2019   Procedure: SHOULDER ARTHROSCOPY WITH open ROTATOR CUFF REPAIR AND OPEN BICEPS TENODESIS;  Surgeon: Carole Civil, MD;  Location: [facility];  Service: Orthopedics;  Laterality: Left;  .  SPINE SURGERY  2015   Dr Hal Neer Fusion   . SPINE SURGERY  1999   discectomy  . TOTAL KNEE ARTHROPLASTY Right 02/02/2015   Procedure:  RIGHT TOTAL KNEE ARTHROPLASTY;  Surgeon: Carole Civil, MD;  Location: [facility];  Service: Orthopedics;  Laterality: Right;  LM with pt's daughter of new arrival time (10:45)    . TOTAL KNEE ARTHROPLASTY Left 07/24/2016   Procedure: TOTAL KNEE ARTHROPLASTY;  Surgeon: Carole Civil, MD;  Location: [facility];  Service: Orthopedics;  Laterality: Left;    Family History  Problem Relation Age of Onset  . Deep vein thrombosis Mother   . Hypertension Mother   .  Breast cancer Mother   . Hypertension Father   . Prostate cancer Father   . CAD Sister     Social History   Tobacco Use  . Smoking status: Former Smoker    Packs/day: 1.50    Years: 20.00    Pack years: 30.00    Types: Cigarettes    Quit date: 02/03/1995    Years since quitting: 24.7  . Smokeless tobacco: Never Used  Substance Use Topics  . Alcohol use: No    Alcohol/week: 0.0 standard drinks  . Drug use: No    General appearance normal development normal nutrition large body habitus no deformities well-groomed  Good distal pulses without varicosities in the upper extremities temperature normal  Gait and station remain relatively normal without assistive device.  Left shoulder  He has no tenderness to palpation but has crepitance on range of motion with popping seems to be coming from the repair site   His active abduction is 90 degrees flexion is 120 degrees his drop test is normal Stability tests were normal, there was some weakness in abduction and flexion but nothing significant  Skin incisions were clean dry and intact  Neurologic exam was normal  Diagnosis pain left shoulder after rotator cuff repair  Plan for arthroscopy left shoulder to evaluate cuff repair and source of crepitance and pain

## 2019-11-10 ENCOUNTER — Ambulatory Visit (HOSPITAL_COMMUNITY): Payer: Medicare HMO | Admitting: Anesthesiology

## 2019-11-10 ENCOUNTER — Ambulatory Visit (HOSPITAL_COMMUNITY)
Admission: AD | Admit: 2019-11-10 | Discharge: 2019-11-10 | Disposition: A | Discharge status: Home / Self Care | Payer: Medicare HMO | Source: Ambulatory Visit | Attending: Orthopedic Surgery | Admitting: Orthopedic Surgery

## 2019-11-10 ENCOUNTER — Encounter (HOSPITAL_COMMUNITY): Payer: Self-pay | Admitting: Orthopedic Surgery

## 2019-11-10 ENCOUNTER — Encounter (HOSPITAL_COMMUNITY)
Admission: AD | Disposition: A | Discharge status: Home / Self Care | Payer: Self-pay | Source: Ambulatory Visit | Attending: Orthopedic Surgery

## 2019-11-10 DIAGNOSIS — E079 Disorder of thyroid, unspecified: Secondary | ICD-10-CM | POA: Insufficient documentation

## 2019-11-10 DIAGNOSIS — Z86718 Personal history of other venous thrombosis and embolism: Secondary | ICD-10-CM | POA: Insufficient documentation

## 2019-11-10 DIAGNOSIS — E119 Type 2 diabetes mellitus without complications: Secondary | ICD-10-CM | POA: Insufficient documentation

## 2019-11-10 DIAGNOSIS — Z8261 Family history of arthritis: Secondary | ICD-10-CM | POA: Insufficient documentation

## 2019-11-10 DIAGNOSIS — M069 Rheumatoid arthritis, unspecified: Secondary | ICD-10-CM | POA: Insufficient documentation

## 2019-11-10 DIAGNOSIS — Z86711 Personal history of pulmonary embolism: Secondary | ICD-10-CM | POA: Diagnosis not present

## 2019-11-10 DIAGNOSIS — M75122 Complete rotator cuff tear or rupture of left shoulder, not specified as traumatic: Secondary | ICD-10-CM | POA: Diagnosis not present

## 2019-11-10 DIAGNOSIS — Z6833 Body mass index (BMI) 33.0-33.9, adult: Secondary | ICD-10-CM | POA: Insufficient documentation

## 2019-11-10 DIAGNOSIS — Z832 Family history of diseases of the blood and blood-forming organs and certain disorders involving the immune mechanism: Secondary | ICD-10-CM | POA: Diagnosis not present

## 2019-11-10 DIAGNOSIS — E669 Obesity, unspecified: Secondary | ICD-10-CM | POA: Insufficient documentation

## 2019-11-10 DIAGNOSIS — Z87891 Personal history of nicotine dependence: Secondary | ICD-10-CM | POA: Insufficient documentation

## 2019-11-10 DIAGNOSIS — M75112 Incomplete rotator cuff tear or rupture of left shoulder, not specified as traumatic: Secondary | ICD-10-CM | POA: Diagnosis not present

## 2019-11-10 DIAGNOSIS — Z8249 Family history of ischemic heart disease and other diseases of the circulatory system: Secondary | ICD-10-CM | POA: Diagnosis not present

## 2019-11-10 DIAGNOSIS — Z8616 Personal history of COVID-19: Secondary | ICD-10-CM | POA: Insufficient documentation

## 2019-11-10 DIAGNOSIS — M1990 Unspecified osteoarthritis, unspecified site: Secondary | ICD-10-CM | POA: Diagnosis not present

## 2019-11-10 DIAGNOSIS — S46012A Strain of muscle(s) and tendon(s) of the rotator cuff of left shoulder, initial encounter: Secondary | ICD-10-CM

## 2019-11-10 DIAGNOSIS — G4730 Sleep apnea, unspecified: Secondary | ICD-10-CM | POA: Insufficient documentation

## 2019-11-10 DIAGNOSIS — Z96653 Presence of artificial knee joint, bilateral: Secondary | ICD-10-CM | POA: Insufficient documentation

## 2019-11-10 HISTORY — PX: SHOULDER ARTHROSCOPY WITH OPEN ROTATOR CUFF REPAIR: SHX6092

## 2019-11-10 LAB — GLUCOSE, CAPILLARY
Glucose-Capillary: 126 mg/dL — ABNORMAL HIGH (ref 70–99)
Glucose-Capillary: 141 mg/dL — ABNORMAL HIGH (ref 70–99)

## 2019-11-10 SURGERY — ARTHROSCOPY, SHOULDER WITH REPAIR, ROTATOR CUFF, OPEN
Anesthesia: General | Site: Shoulder | Laterality: Left

## 2019-11-10 MED ORDER — PHENYLEPHRINE HCL (PRESSORS) 10 MG/ML IV SOLN
INTRAVENOUS | Status: AC
  Filled 2019-11-10: qty 1

## 2019-11-10 MED ORDER — BUPIVACAINE-EPINEPHRINE (PF) 0.5% -1:200000 IJ SOLN
INTRAMUSCULAR | Status: AC
  Filled 2019-11-10: qty 30

## 2019-11-10 MED ORDER — ROPIVACAINE HCL 5 MG/ML IJ SOLN
INTRAMUSCULAR | Status: DC | PRN
  Administered 2019-11-10: 20 mL via EPIDURAL

## 2019-11-10 MED ORDER — PROPOFOL 10 MG/ML IV BOLUS
INTRAVENOUS | Status: AC
  Filled 2019-11-10: qty 40

## 2019-11-10 MED ORDER — SODIUM CHLORIDE 0.9 % IR SOLN
Status: DC | PRN
  Administered 2019-11-10: 1000 mL

## 2019-11-10 MED ORDER — SUCCINYLCHOLINE CHLORIDE 200 MG/10ML IV SOSY
PREFILLED_SYRINGE | INTRAVENOUS | Status: DC | PRN
  Administered 2019-11-10: 120 mg via INTRAVENOUS

## 2019-11-10 MED ORDER — IBUPROFEN 800 MG PO TABS: 800.0000 mg | ORAL_TABLET | Freq: Three times a day (TID) | ORAL | 5 refills | Status: DC | PRN

## 2019-11-10 MED ORDER — CEFAZOLIN SODIUM-DEXTROSE 2-4 GM/100ML-% IV SOLN
INTRAVENOUS | Status: AC
  Filled 2019-11-10: qty 100

## 2019-11-10 MED ORDER — OXYCODONE-ACETAMINOPHEN 7.5-325 MG PO TABS: 1.0000 | ORAL_TABLET | ORAL | 0 refills | Status: DC | PRN

## 2019-11-10 MED ORDER — LIDOCAINE HCL (CARDIAC) PF 100 MG/5ML IV SOSY
PREFILLED_SYRINGE | INTRAVENOUS | Status: DC | PRN
  Administered 2019-11-10: 60 mg via INTRAVENOUS

## 2019-11-10 MED ORDER — ROPIVACAINE HCL 5 MG/ML IJ SOLN
INTRAMUSCULAR | Status: AC
  Filled 2019-11-10: qty 30

## 2019-11-10 MED ORDER — PROPOFOL 10 MG/ML IV BOLUS
INTRAVENOUS | Status: DC | PRN
  Administered 2019-11-10: 50 mg via INTRAVENOUS
  Administered 2019-11-10: 300 mg via INTRAVENOUS

## 2019-11-10 MED ORDER — PHENYLEPHRINE 40 MCG/ML (10ML) SYRINGE FOR IV PUSH (FOR BLOOD PRESSURE SUPPORT)
PREFILLED_SYRINGE | INTRAVENOUS | Status: AC
  Filled 2019-11-10: qty 20

## 2019-11-10 MED ORDER — CHLORHEXIDINE GLUCONATE 4 % EX LIQD: 60.0000 mL | Freq: Once | CUTANEOUS | Status: DC

## 2019-11-10 MED ORDER — TIZANIDINE HCL 4 MG PO TABS: 4.0000 mg | ORAL_TABLET | Freq: Three times a day (TID) | ORAL | 1 refills | Status: DC

## 2019-11-10 MED ORDER — LACTATED RINGERS IV SOLN: INTRAVENOUS | Status: DC

## 2019-11-10 MED ORDER — ONDANSETRON HCL 4 MG/2ML IJ SOLN
INTRAMUSCULAR | Status: AC
  Filled 2019-11-10: qty 2

## 2019-11-10 MED ORDER — ONDANSETRON HCL 4 MG/2ML IJ SOLN
INTRAMUSCULAR | Status: DC | PRN
  Administered 2019-11-10: 4 mg via INTRAVENOUS

## 2019-11-10 MED ORDER — PHENYLEPHRINE HCL-NACL 10-0.9 MG/250ML-% IV SOLN
INTRAVENOUS | Status: DC | PRN
  Administered 2019-11-10: 25 ug/min via INTRAVENOUS

## 2019-11-10 MED ORDER — CELECOXIB 400 MG PO CAPS
400.0000 mg | ORAL_CAPSULE | Freq: Once | ORAL | Status: AC
  Administered 2019-11-10: 400 mg via ORAL
  Filled 2019-11-10: qty 1

## 2019-11-10 MED ORDER — PHENYLEPHRINE HCL (PRESSORS) 10 MG/ML IV SOLN
INTRAVENOUS | Status: DC | PRN
  Administered 2019-11-10: 200 ug via INTRAVENOUS
  Administered 2019-11-10: 100 ug via INTRAVENOUS
  Administered 2019-11-10: 200 ug via INTRAVENOUS
  Administered 2019-11-10: 100 ug via INTRAVENOUS
  Administered 2019-11-10 (×3): 200 ug via INTRAVENOUS

## 2019-11-10 MED ORDER — METHOCARBAMOL 1000 MG/10ML IJ SOLN
500.0000 mg | Freq: Once | INTRAVENOUS | Status: AC
  Administered 2019-11-10: 500 mg via INTRAVENOUS
  Filled 2019-11-10: qty 5

## 2019-11-10 MED ORDER — FENTANYL CITRATE (PF) 100 MCG/2ML IJ SOLN
INTRAMUSCULAR | Status: DC | PRN
  Administered 2019-11-10: 100 ug via INTRAVENOUS

## 2019-11-10 MED ORDER — MIDAZOLAM HCL 2 MG/2ML IJ SOLN
INTRAMUSCULAR | Status: AC
  Filled 2019-11-10: qty 2

## 2019-11-10 MED ORDER — PROMETHAZINE HCL 12.5 MG PO TABS: 12.5000 mg | ORAL_TABLET | Freq: Four times a day (QID) | ORAL | 0 refills | Status: DC | PRN

## 2019-11-10 MED ORDER — EPINEPHRINE PF 1 MG/ML IJ SOLN
INTRAMUSCULAR | Status: AC
  Filled 2019-11-10: qty 8

## 2019-11-10 MED ORDER — SUCCINYLCHOLINE CHLORIDE 200 MG/10ML IV SOSY
PREFILLED_SYRINGE | INTRAVENOUS | Status: AC
  Filled 2019-11-10: qty 10

## 2019-11-10 MED ORDER — LIDOCAINE 2% (20 MG/ML) 5 ML SYRINGE
INTRAMUSCULAR | Status: AC
  Filled 2019-11-10: qty 5

## 2019-11-10 MED ORDER — PHENYLEPHRINE 40 MCG/ML (10ML) SYRINGE FOR IV PUSH (FOR BLOOD PRESSURE SUPPORT)
PREFILLED_SYRINGE | INTRAVENOUS | Status: AC
  Filled 2019-11-10: qty 10

## 2019-11-10 MED ORDER — FENTANYL CITRATE (PF) 250 MCG/5ML IJ SOLN
INTRAMUSCULAR | Status: AC
  Filled 2019-11-10: qty 5

## 2019-11-10 MED ORDER — ACETAMINOPHEN 500 MG PO TABS
500.0000 mg | ORAL_TABLET | Freq: Once | ORAL | Status: AC
  Administered 2019-11-10: 500 mg via ORAL
  Filled 2019-11-10: qty 1

## 2019-11-10 MED ORDER — MIDAZOLAM HCL 5 MG/5ML IJ SOLN: INTRAMUSCULAR | Status: DC | PRN

## 2019-11-10 MED ORDER — OXYCODONE HCL 5 MG PO TABS
5.0000 mg | ORAL_TABLET | Freq: Once | ORAL | Status: AC
  Administered 2019-11-10: 5 mg via ORAL
  Filled 2019-11-10: qty 1

## 2019-11-10 MED ORDER — CEFAZOLIN SODIUM-DEXTROSE 2-4 GM/100ML-% IV SOLN
2.0000 g | INTRAVENOUS | Status: AC
  Administered 2019-11-10: 08:00:00 2 g via INTRAVENOUS

## 2019-11-10 MED ORDER — SODIUM CHLORIDE 0.9 % IR SOLN
Status: DC | PRN
  Administered 2019-11-10 (×2): 3000 mL

## 2019-11-10 MED ORDER — ONDANSETRON HCL 4 MG/2ML IJ SOLN
4.0000 mg | Freq: Once | INTRAMUSCULAR | Status: AC
  Administered 2019-11-10: 4 mg via INTRAVENOUS
  Filled 2019-11-10: qty 2

## 2019-11-10 SURGICAL SUPPLY — 55 items
ANCHOR SUT CORKSCREW 5X15.5 (Anchor) ×1 IMPLANT
BLADE AGGRESSIVE PLUS 4.0 (BLADE) ×2 IMPLANT
BLADE SURG SZ11 CARB STEEL (BLADE) ×2 IMPLANT
CANNULA DRILOCK 5.0X75 (CANNULA) ×1 IMPLANT
CHLORAPREP W/TINT 26 (MISCELLANEOUS) ×2 IMPLANT
CLOTH BEACON ORANGE TIMEOUT ST (SAFETY) ×2 IMPLANT
COVER LIGHT HANDLE STERIS (MISCELLANEOUS) ×4 IMPLANT
COVER WAND RF STERILE (DRAPES) ×2 IMPLANT
DRAPE SHOULDER BEACH CHAIR (DRAPES) ×2 IMPLANT
DRAPE U-SHAPE 47X51 STRL (DRAPES) ×2 IMPLANT
DRESSING ALLEVYN BORDER 5X5 (GAUZE/BANDAGES/DRESSINGS) ×1 IMPLANT
DRESSING MEPILEX BORDER 6X8 (GAUZE/BANDAGES/DRESSINGS) ×2 IMPLANT
DRSG MEPILEX BORDER 6X8 (GAUZE/BANDAGES/DRESSINGS) ×4
DRSG TEGADERM 2-3/8X2-3/4 SM (GAUZE/BANDAGES/DRESSINGS) ×2 IMPLANT
ELECT REM PT RETURN 9FT ADLT (ELECTROSURGICAL) ×2
ELECTRODE REM PT RTRN 9FT ADLT (ELECTROSURGICAL) ×1 IMPLANT
GLOVE BIO SURGEON STRL SZ7 (GLOVE) ×1 IMPLANT
GLOVE BIOGEL PI IND STRL 7.0 (GLOVE) ×3 IMPLANT
GLOVE BIOGEL PI INDICATOR 7.0 (GLOVE) ×3
GLOVE ECLIPSE 6.5 STRL STRAW (GLOVE) ×1 IMPLANT
GLOVE SKINSENSE NS SZ8.0 LF (GLOVE) ×2
GLOVE SKINSENSE STRL SZ8.0 LF (GLOVE) ×2 IMPLANT
GLOVE SS N UNI LF 8.5 STRL (GLOVE) ×2 IMPLANT
GOWN STRL REUS W/TWL LRG LVL3 (GOWN DISPOSABLE) ×3 IMPLANT
GOWN STRL REUS W/TWL XL LVL3 (GOWN DISPOSABLE) ×2 IMPLANT
IV NS IRRIG 3000ML ARTHROMATIC (IV SOLUTION) ×4 IMPLANT
KIT BLADEGUARD II DBL (SET/KITS/TRAYS/PACK) ×2 IMPLANT
KIT POSITION SHOULDER SCHLEI (MISCELLANEOUS) ×2 IMPLANT
KIT TURNOVER KIT A (KITS) ×2 IMPLANT
MANIFOLD NEPTUNE II (INSTRUMENTS) ×2 IMPLANT
MARKER SKIN DUAL TIP RULER LAB (MISCELLANEOUS) ×2 IMPLANT
NDL SPNL 18GX3.5 QUINCKE PK (NEEDLE) ×1 IMPLANT
NEEDLE SPNL 18GX3.5 QUINCKE PK (NEEDLE) ×2 IMPLANT
NS IRRIG 1000ML POUR BTL (IV SOLUTION) ×2 IMPLANT
PACK TOTAL JOINT (CUSTOM PROCEDURE TRAY) ×2 IMPLANT
PAD ARMBOARD 7.5X6 YLW CONV (MISCELLANEOUS) ×2 IMPLANT
PEEK PUSHLOCK 4.5X18.5 (Peek) ×1 IMPLANT
SET ARTHROSCOPY INST (INSTRUMENTS) ×2 IMPLANT
SET BASIN LINEN APH (SET/KITS/TRAYS/PACK) ×2 IMPLANT
SET TUBE SUCT SHAVER OUTFL 24K (TUBING) ×2 IMPLANT
SLING ARM FOAM STRAP LRG (SOFTGOODS) ×2 IMPLANT
SLING ARM FOAM STRAP XLG (SOFTGOODS) ×1 IMPLANT
SLING ARM IMMOBILIZER XL (CAST SUPPLIES) ×2 IMPLANT
SPONGE GAUZE 2X2 8PLY STRL LF (GAUZE/BANDAGES/DRESSINGS) ×2 IMPLANT
STAPLER VISISTAT 35W (STAPLE) ×1 IMPLANT
SUT ETHILON 3 0 FSL (SUTURE) ×2 IMPLANT
SUT MON AB 0 CT1 (SUTURE) ×1 IMPLANT
SUT MON AB 2-0 CT1 36 (SUTURE) ×1 IMPLANT
SUTURE TAPE 1.3 40 TPR END (SUTURE) IMPLANT
SUTURETAPE 1.3 40 TPR END (SUTURE) ×2
SYR 30ML LL (SYRINGE) ×2 IMPLANT
SYR BULB IRRIGATION 50ML (SYRINGE) ×1 IMPLANT
TUBING ARTHRO INFLOW-ONLY STRL (TUBING) ×2 IMPLANT
WAND 90 DEG TURBOVAC W/CORD (SURGICAL WAND) ×2 IMPLANT
YANKAUER SUCT 12FT TUBE ARGYLE (SUCTIONS) ×1 IMPLANT

## 2019-11-10 NOTE — Discharge Instructions (Signed)
General Anesthesia, Adult, Care After This sheet gives you information about how to care for yourself after your procedure. Your health care provider may also give you more specific instructions. If you have problems or questions, contact your health care provider. What can I expect after the procedure? After the procedure, the following side effects are common:  Pain or discomfort at the IV site.  Nausea.  Vomiting.  Sore throat.  Trouble concentrating.  Feeling cold or chills.  Weak or tired.  Sleepiness and fatigue.  Soreness and body aches. These side effects can affect parts of the body that were not involved in surgery. Follow these instructions at home:  For at least 24 hours after the procedure:  Have a responsible adult stay with you. It is important to have someone help care for you until you are awake and alert.  Rest as needed.  Do not: ? Participate in activities in which you could fall or become injured. ? Drive. ? Use heavy machinery. ? Drink alcohol. ? Take sleeping pills or medicines that cause drowsiness. ? Make important decisions or sign legal documents. ? Take care of children on your own. Eating and drinking  Follow any instructions from your health care provider about eating or drinking restrictions.  When you feel hungry, start by eating small amounts of foods that are soft and easy to digest (bland), such as toast. Gradually return to your regular diet.  Drink enough fluid to keep your urine pale yellow.  If you vomit, rehydrate by drinking water, juice, or clear broth. General instructions  If you have sleep apnea, surgery and certain medicines can increase your risk for breathing problems. Follow instructions from your health care provider about wearing your sleep device: ? Anytime you are sleeping, including during daytime naps. ? While taking prescription pain medicines, sleeping medicines, or medicines that make you drowsy.  Return to  your normal activities as told by your health care provider. Ask your health care provider what activities are safe for you.  Take over-the-counter and prescription medicines only as told by your health care provider.  If you smoke, do not smoke without supervision.  Keep all follow-up visits as told by your health care provider. This is important. Contact a health care provider if:  You have nausea or vomiting that does not get better with medicine.  You cannot eat or drink without vomiting.  You have pain that does not get better with medicine.  You are unable to pass urine.  You develop a skin rash.  You have a fever.  You have redness around your IV site that gets worse. Get help right away if:  You have difficulty breathing.  You have chest pain.  You have blood in your urine or stool, or you vomit blood. Summary  After the procedure, it is common to have a sore throat or nausea. It is also common to feel tired.  Have a responsible adult stay with you for the first 24 hours after general anesthesia. It is important to have someone help care for you until you are awake and alert.  When you feel hungry, start by eating small amounts of foods that are soft and easy to digest (bland), such as toast. Gradually return to your regular diet.  Drink enough fluid to keep your urine pale yellow.  Return to your normal activities as told by your health care provider. Ask your health care provider what activities are safe for you. This information is not   intended to replace advice given to you by your health care provider. Make sure you discuss any questions you have with your health care provider. Document Revised: 08/07/2017 Document Reviewed: 03/20/2017 Elsevier Patient Education  2020 [employer] Inc.  

## 2019-11-10 NOTE — Anesthesia Postprocedure Evaluation (Signed)
Anesthesia Post Note  Patient: Willie Howe  Procedure(s) Performed: SHOULDER ARTHROSCOPY WITH OPEN ROTATOR CUFF REPAIR (Left Shoulder)  Patient location during evaluation: PACU Anesthesia Type: General Level of consciousness: awake and alert, patient cooperative, awake and oriented Pain management: pain level controlled Vital Signs Assessment: post-procedure vital signs reviewed and stable Respiratory status: spontaneous breathing, respiratory function stable and nonlabored ventilation Cardiovascular status: stable Postop Assessment: no apparent nausea or vomiting Anesthetic complications: no     Last Vitals:  Vitals:   11/10/19 0648 11/10/19 0951  BP: (!) 140/91   Pulse: 81   Resp: 18 (P) 20  Temp: 36.7 C (P) 36.8 C  SpO2: 93%     Last Pain:  Vitals:   11/10/19 0648  TempSrc: Oral  PainSc: 6                  Donnika Kucher

## 2019-11-10 NOTE — Anesthesia Procedure Notes (Signed)
Procedure Name: Intubation Date/Time: 11/10/2019 8:03 AM Performed by: Jonna Munro, CRNA Pre-anesthesia Checklist: Patient identified, Emergency Drugs available, Suction available, Patient being monitored and Timeout performed Patient Re-evaluated:Patient Re-evaluated prior to induction Oxygen Delivery Method: Circle system utilized Preoxygenation: Pre-oxygenation with 100% oxygen Induction Type: IV induction and Rapid sequence Ventilation: Two handed mask ventilation required Laryngoscope Size: Mac and 4 Grade View: Grade I Tube type: Oral Tube size: 7.5 mm Number of attempts: 1 Airway Equipment and Method: Stylet Placement Confirmation: ETT inserted through vocal cords under direct vision,  positive ETCO2 and breath sounds checked- equal and bilateral Secured at: 22 cm Tube secured with: Tape Dental Injury: Teeth and Oropharynx as per pre-operative assessment  Comments: LMA not seated well, attempted to readjust, and unsuccessful. Patient positioned supine, LMA removed, bag mask ventilation successfully. RSI with easy atraumatic intubation. +ETCO2, equal bilat breath sounds. Tube secured.

## 2019-11-10 NOTE — Addendum Note (Signed)
Addendum  created 11/10/19 1532 by Vista Deck, CRNA   Charge Capture section accepted

## 2019-11-10 NOTE — Anesthesia Procedure Notes (Signed)
Procedure Name: LMA Insertion Date/Time: 11/10/2019 7:48 AM Performed by: Jonna Munro, CRNA Pre-anesthesia Checklist: Patient identified, Emergency Drugs available, Suction available, Patient being monitored and Timeout performed Patient Re-evaluated:Patient Re-evaluated prior to induction Oxygen Delivery Method: Circle system utilized Preoxygenation: Pre-oxygenation with 100% oxygen Induction Type: IV induction LMA: LMA inserted LMA Size: 5.0 Number of attempts: 1 Placement Confirmation: positive ETCO2 and breath sounds checked- equal and bilateral Tube secured with: Tape Dental Injury: Teeth and Oropharynx as per pre-operative assessment

## 2019-11-10 NOTE — Anesthesia Preprocedure Evaluation (Signed)
Anesthesia Evaluation  Patient identified by MRN, date of birth, ID band Patient awake    Reviewed: Allergy & Precautions, H&P , NPO status , Patient's Chart, lab work & pertinent test results, reviewed documented beta blocker date and time   Airway Mallampati: II  TM Distance: >3 FB Neck ROM: limited    Dental  (+) Teeth Intact   Pulmonary sleep apnea , former smoker,    Pulmonary exam normal        Cardiovascular hypertension, On Medications + angina Normal cardiovascular exam     Neuro/Psych  Headaches,    GI/Hepatic negative GI ROS, Neg liver ROS,   Endo/Other  diabetesHyperthyroidism   Renal/GU negative Renal ROS     Musculoskeletal   Abdominal   Peds  Hematology negative hematology ROS (+)   Anesthesia Other Findings   Reproductive/Obstetrics negative OB ROS                             Anesthesia Physical Anesthesia Plan  ASA: II  Anesthesia Plan: General   Post-op Pain Management: GA combined w/ Regional for post-op pain   Induction:   PONV Risk Score and Plan: 2 and Ondansetron  Airway Management Planned:   Additional Equipment:   Intra-op Plan:   Post-operative Plan:   Informed Consent: I have reviewed the patients History and Physical, chart, labs and discussed the procedure including the risks, benefits and alternatives for the proposed anesthesia with the patient or authorized representative who has indicated his/her understanding and acceptance.       Plan Discussed with: CRNA  Anesthesia Plan Comments:         Anesthesia Quick Evaluation

## 2019-11-10 NOTE — Transfer of Care (Signed)
Immediate Anesthesia Transfer of Care Note  Patient: Willie Howe  Procedure(s) Performed: SHOULDER ARTHROSCOPY WITH OPEN ROTATOR CUFF REPAIR (Left Shoulder)  Patient Location: PACU  Anesthesia Type:General  Level of Consciousness: awake, alert , oriented and patient cooperative  Airway & Oxygen Therapy: Patient Spontanous Breathing and Patient connected to face mask oxygen  Post-op Assessment: Report given to RN, Post -op Vital signs reviewed and stable and Patient moving all extremities X 4  Post vital signs: Reviewed and stable  Last Vitals:  Vitals Value Taken Time  BP 135/94 11/10/19 0951  Temp    Pulse 82 11/10/19 0953  Resp 20 11/10/19 0953  SpO2 99 % 11/10/19 0953  Vitals shown include unvalidated device data.  Last Pain:  Vitals:   11/10/19 0648  TempSrc: Oral  PainSc: 6       Patients Stated Pain Goal: 7 ([id number])  Complications: No apparent anesthesia complications

## 2019-11-10 NOTE — Op Note (Signed)
11/10/2019  9:53 AM  PATIENT:  Willie Howe  59 y.o. male  PRE-OPERATIVE DIAGNOSIS:  Adhesions left shoulder  POST-OPERATIVE DIAGNOSIS:  re-tear of left rotator cuff  PROCEDURE:  Procedure(s) with comments:---[id number] SHOULDER ARTHROSCOPY WITH OPEN ROTATOR CUFF REPAIR (Left) - pt tested + on 2/23, does not need Covid test < 90 days   Findings the patient had a rerupture of his previous repair the cuff pulled through the sutures.  The previous sutures were removed at surgery.  The rotator cuff tendinous portion was externally rotated from the anterior portion of the cuff making this a 2 tendon type tear involving the posterior portion of the supraspinatus and the infraspinatus.  Repair was performed with margin convergence using suture tape from [employer] and then a corkscrew anchor with 1 suture anteriorly and one posteriorly to capture the remaining portions of the cuff there was a small 5 mm place in the cuff that was not covering the humeral head and then the push lock anchor was used to anchor the suture ends to the humerus   SURGEON:  Surgeon(s) and Role:    Aline Brochure, Tim Lair, MD - Primary  Assisted by Corrie Dandy  The patient had an interscalene block and general anesthesia  Minimal blood loss  No local medication  No specimens  Counts were correct    TOURNIQUET:  * No tourniquets in log *  DICTATION: .Dragon Dictation  PLAN OF CARE: Discharge to home after PACU  PATIENT DISPOSITION:  PACU - hemodynamically stable.   Delay start of Pharmacological VTE agent (>24hrs) due to surgical blood loss or risk of bleeding: not applicable   The surgery was done as follows  Willie Howe was seen in preop surgical site was confirmed and marked chart review was completed patient had an interscalene block and then was brought to surgery had an LMA anesthetic which had to be converted to general intubation after the LMA would not stay in position  He was then placed in the  modified beachchair position with appropriate padding  He was prepped and draped sterilely.  After timeout his exam under anesthesia revealed no restrictions in his range of motion  A posterior portal was placed and the scope was placed into the joint.  The glenohumeral joint was relatively normal.  The cuff tear was obvious and the scope was then placed in the subacromial space.  The cuff tear was identified debrided through an anterior portal going through the previous mini open incision.  And then we converted to a straight open procedure using the previous incision we divided down to the deltoid fascia extended proximally and distally.  With finger dissection mobilize the skin flaps and then divided the deltoid peeling some of it from the anterior acromion.  The patient did not need acromioplasty  The tendon was identified it was not mobile from medial to lateral but was mobile from posterior to anterior.  With margin convergence sutures x4 we were able to bring most of the cuff together except for about a 5 mm portion towards the tuberosity.  I then used a rongeured to create a bony bleeding bed and then placed a suture anchor and took the anterior suture in the anterior portion of the cuff and then the posterior suture in the posterior portion of the cuff and then took the 4 sutures and placed a push lock anchor laterally  This anchored the repair nicely there was no tension on the repair of their entire repair  was done with the patient's arm at his side and after checking for range of motion closed with irrigation interrupted #2 Ethibond to close the deltoid split and then 0 Monocryl and staples to close the skin using a 3-0 nylon for the posterior portal  Sterile dressing was applied patient was extubated placed in a sling and taken to recovery room in stable condition

## 2019-11-10 NOTE — Interval H&P Note (Signed)
History and Physical Interval Note:  11/10/2019 7:26 AM  Willie Howe  has presented today for surgery, with the diagnosis of Adhesions left shoulder.  The various methods of treatment have been discussed with the patient and family. After consideration of risks, benefits and other options for treatment, the patient has consented to  Procedure(s) with comments: ARTHROSCOPY SHOULDER / DEBRIDEMENT (Left) - pt tested + on 2/23, does not need Covid test < 90 days as a surgical intervention.  The patient's history has been reviewed, patient examined, no change in status, stable for surgery.  I have reviewed the patient's chart and labs.  Questions were answered to the patient's satisfaction.     Arther Abbott

## 2019-11-10 NOTE — Anesthesia Procedure Notes (Signed)
Anesthesia Regional Block: Interscalene brachial plexus block   Pre-Anesthetic Checklist: ,, timeout performed, Correct Patient, Correct Site, Correct Laterality, Correct Procedure, Correct Position, site marked, Risks and benefits discussed,  Surgical consent,  Pre-op evaluation,  At surgeon's request and post-op pain management  Laterality: Left  Prep: Maximum Sterile Barrier Precautions used, chloraprep       Needles:  Injection technique: Single-shot  Needle Type: Stimiplex     Needle Length: 5cm  Needle Gauge: 22     Additional Needles:   Procedures:, nerve stimulator,,, ultrasound used (permanent image in chart),,,,   Nerve Stimulator or Paresthesia:  Response: Thenar Twitch, 0.4 mA,   Additional Responses:   Narrative:  Start time: 11/10/2019 7:13 AM End time: 11/10/2019 7:16 AM

## 2019-11-10 NOTE — Brief Op Note (Signed)
11/10/2019  9:53 AM  PATIENT:  Willie Howe  59 y.o. male  PRE-OPERATIVE DIAGNOSIS:  Adhesions left shoulder  POST-OPERATIVE DIAGNOSIS:  re-tear of left rotator cuff  PROCEDURE:  Procedure(s) with comments: SHOULDER ARTHROSCOPY WITH OPEN ROTATOR CUFF REPAIR (Left) - pt tested + on 2/23, does not need Covid test < 90 days   Findings the patient had a rerupture of his previous repair the cuff pulled through the sutures.  The previous sutures were removed at surgery.  The rotator cuff tendinous portion was externally rotated from the anterior portion of the cuff making this a 2 tendon type tear involving the posterior portion of the supraspinatus and the infraspinatus.  Repair was performed with margin convergence using suture tape from [employer] and then a corkscrew anchor with 1 suture anteriorly and one posteriorly to capture the remaining portions of the cuff there was a small 5 mm place in the cuff that was not covering the humeral head and then the push lock anchor was used to anchor the suture ends to the humerus   SURGEON:  Surgeon(s) and Role:    Aline Brochure, Tim Lair, MD - Primary  Assisted by Corrie Dandy  The patient had an interscalene block and general anesthesia  Minimal blood loss  No local medication  No specimens  Counts were correct    TOURNIQUET:  * No tourniquets in log *  DICTATION: .Dragon Dictation  PLAN OF CARE: Discharge to home after PACU  PATIENT DISPOSITION:  PACU - hemodynamically stable.   Delay start of Pharmacological VTE agent (>24hrs) due to surgical blood loss or risk of bleeding: not applicable   The surgery was done as follows  [name] was seen in preop surgical site was confirmed and marked chart review was completed patient had an interscalene block and then was brought to surgery had an LMA anesthetic which had to be converted to general intubation after the LMA would not stay in position  He was then placed in the modified  beachchair position with appropriate padding  He was prepped and draped sterilely.  After timeout his exam under anesthesia revealed no restrictions in his range of motion  A posterior portal was placed and the scope was placed into the joint.  The glenohumeral joint was relatively normal.  The cuff tear was obvious and the scope was then placed in the subacromial space.  The cuff tear was identified debrided through an anterior portal going through the previous mini open incision.  And then we converted to a straight open procedure using the previous incision we divided down to the deltoid fascia extended proximally and distally.  With finger dissection mobilize the skin flaps and then divided the deltoid peeling some of it from the anterior acromion.  The patient did not need acromioplasty  The tendon was identified it was not mobile from medial to lateral but was mobile from posterior to anterior.  With margin convergence sutures x4 we were able to bring most of the cuff together except for about a 5 mm portion towards the tuberosity.  I then used a rongeured to create a bony bleeding bed and then placed a suture anchor and took the anterior suture in the anterior portion of the cuff and then the posterior suture in the posterior portion of the cuff and then took the 4 sutures and placed a push lock anchor laterally  This anchored the repair nicely there was no tension on the repair of their entire repair  was done with the patient's arm at his side and after checking for range of motion closed with irrigation interrupted #2 Ethibond to close the deltoid split and then 0 Monocryl and staples to close the skin using a 3-0 nylon for the posterior portal  Sterile dressing was applied patient was extubated placed in a sling and taken to recovery room in stable condition

## 2019-11-16 ENCOUNTER — Ambulatory Visit (INDEPENDENT_AMBULATORY_CARE_PROVIDER_SITE_OTHER): Payer: Medicare HMO | Admitting: Orthopedic Surgery

## 2019-11-16 ENCOUNTER — Other Ambulatory Visit: Payer: Self-pay

## 2019-11-16 ENCOUNTER — Encounter: Payer: Self-pay | Admitting: Orthopedic Surgery

## 2019-11-16 DIAGNOSIS — Z4889 Encounter for other specified surgical aftercare: Secondary | ICD-10-CM

## 2019-11-16 DIAGNOSIS — Z9889 Other specified postprocedural states: Secondary | ICD-10-CM

## 2019-11-16 MED ORDER — OXYCODONE-ACETAMINOPHEN 7.5-325 MG PO TABS: 1.0000 | ORAL_TABLET | ORAL | 0 refills | Status: DC | PRN

## 2019-11-16 NOTE — Progress Notes (Signed)
Chief Complaint  Patient presents with  . Routine Post Op    11/10/19 left shoulder repair Rotator cuff   . Medication Refill    pain meds/ oxycodone    Encounter Diagnoses  Name Primary?  . Tear of left rotator cuff, unspecified tear extent, unspecified whether traumatic Yes  . Aftercare following surgery     Postop visit #1 status post rerepair of retear previous rotator cuff repair left shoulder  Meds oxycodone, tizanidine, ibuprofen, Phenergan  [name] is getting good pain control postop day 6 with oxycodone 7.5.  Wound looks good.  He is going to stay in a sling for 6 weeks.  He can remove it for getting dressed and bathing.  Staples out nurse visit in a week    Meds ordered this encounter  Medications  . oxyCODONE-acetaminophen (PERCOCET) 7.5-325 MG tablet    Sig: Take 1 tablet by mouth every 4 (four) hours as needed for severe pain.    Dispense:  42 tablet    Refill:  0

## 2019-11-23 ENCOUNTER — Ambulatory Visit (INDEPENDENT_AMBULATORY_CARE_PROVIDER_SITE_OTHER): Payer: Medicare HMO | Admitting: Orthopedic Surgery

## 2019-11-23 ENCOUNTER — Other Ambulatory Visit: Payer: Self-pay

## 2019-11-23 ENCOUNTER — Encounter: Payer: Self-pay | Admitting: Orthopedic Surgery

## 2019-11-23 DIAGNOSIS — Z9889 Other specified postprocedural states: Secondary | ICD-10-CM

## 2019-11-23 DIAGNOSIS — Z4889 Encounter for other specified surgical aftercare: Secondary | ICD-10-CM

## 2019-11-23 MED ORDER — OXYCODONE-ACETAMINOPHEN 7.5-325 MG PO TABS: 1.0000 | ORAL_TABLET | ORAL | 0 refills | Status: DC | PRN

## 2019-11-23 NOTE — Progress Notes (Signed)
Chief Complaint  Patient presents with  . Routine Post Op    11/10/19 right RCR (recurrent tear )   2 weeks post op   Staples Staples were removed 1 weeks good he can shower  Placed him in a sling shot  Come back in 4 weeks to start therapy  Meds ordered this encounter  Medications  . oxyCODONE-acetaminophen (PERCOCET) 7.5-325 MG tablet    Sig: Take 1 tablet by mouth every 4 (four) hours as needed for severe pain.    Dispense:  42 tablet    Refill:  0    Encounter Diagnoses  Name Primary?  . S/P left rotator cuff repair/ biceps tenodesis 02/15/19 repeat RCR 11/10/19  Yes  . Aftercare following surgery

## 2019-11-23 NOTE — Patient Instructions (Signed)
Wear sling

## 2019-11-27 ENCOUNTER — Encounter (HOSPITAL_COMMUNITY): Payer: Self-pay | Admitting: Emergency Medicine

## 2019-11-27 ENCOUNTER — Observation Stay (HOSPITAL_COMMUNITY)
Admission: EM | Admit: 2019-11-27 | Discharge: 2019-11-29 | Disposition: A | Discharge status: Home / Self Care | Payer: Medicare HMO | Attending: Family Medicine | Admitting: Family Medicine

## 2019-11-27 ENCOUNTER — Other Ambulatory Visit: Payer: Self-pay

## 2019-11-27 ENCOUNTER — Emergency Department (HOSPITAL_COMMUNITY): Payer: Medicare HMO

## 2019-11-27 DIAGNOSIS — E119 Type 2 diabetes mellitus without complications: Secondary | ICD-10-CM | POA: Diagnosis not present

## 2019-11-27 DIAGNOSIS — Z8249 Family history of ischemic heart disease and other diseases of the circulatory system: Secondary | ICD-10-CM | POA: Diagnosis not present

## 2019-11-27 DIAGNOSIS — E669 Obesity, unspecified: Secondary | ICD-10-CM | POA: Diagnosis present

## 2019-11-27 DIAGNOSIS — Z79899 Other long term (current) drug therapy: Secondary | ICD-10-CM | POA: Diagnosis not present

## 2019-11-27 DIAGNOSIS — R079 Chest pain, unspecified: Secondary | ICD-10-CM | POA: Diagnosis present

## 2019-11-27 DIAGNOSIS — Z791 Long term (current) use of non-steroidal anti-inflammatories (NSAID): Secondary | ICD-10-CM | POA: Diagnosis not present

## 2019-11-27 DIAGNOSIS — G4733 Obstructive sleep apnea (adult) (pediatric): Secondary | ICD-10-CM | POA: Insufficient documentation

## 2019-11-27 DIAGNOSIS — Z7901 Long term (current) use of anticoagulants: Secondary | ICD-10-CM | POA: Insufficient documentation

## 2019-11-27 DIAGNOSIS — E079 Disorder of thyroid, unspecified: Secondary | ICD-10-CM | POA: Diagnosis not present

## 2019-11-27 DIAGNOSIS — Z87891 Personal history of nicotine dependence: Secondary | ICD-10-CM | POA: Insufficient documentation

## 2019-11-27 DIAGNOSIS — Z7952 Long term (current) use of systemic steroids: Secondary | ICD-10-CM | POA: Diagnosis not present

## 2019-11-27 DIAGNOSIS — Z888 Allergy status to other drugs, medicaments and biological substances status: Secondary | ICD-10-CM | POA: Diagnosis not present

## 2019-11-27 DIAGNOSIS — M069 Rheumatoid arthritis, unspecified: Secondary | ICD-10-CM | POA: Insufficient documentation

## 2019-11-27 DIAGNOSIS — Z86711 Personal history of pulmonary embolism: Secondary | ICD-10-CM | POA: Insufficient documentation

## 2019-11-27 DIAGNOSIS — R001 Bradycardia, unspecified: Secondary | ICD-10-CM | POA: Diagnosis present

## 2019-11-27 DIAGNOSIS — Z86718 Personal history of other venous thrombosis and embolism: Secondary | ICD-10-CM | POA: Insufficient documentation

## 2019-11-27 DIAGNOSIS — I4510 Unspecified right bundle-branch block: Secondary | ICD-10-CM | POA: Insufficient documentation

## 2019-11-27 DIAGNOSIS — R072 Precordial pain: Secondary | ICD-10-CM

## 2019-11-27 DIAGNOSIS — Z7982 Long term (current) use of aspirin: Secondary | ICD-10-CM | POA: Insufficient documentation

## 2019-11-27 DIAGNOSIS — Z6834 Body mass index (BMI) 34.0-34.9, adult: Secondary | ICD-10-CM | POA: Diagnosis not present

## 2019-11-27 HISTORY — DX: Type 2 diabetes mellitus without complications: E11.9

## 2019-11-27 LAB — CBC WITH DIFFERENTIAL/PLATELET
Abs Immature Granulocytes: 0.03 10*3/uL (ref 0.00–0.07)
Basophils Absolute: 0 10*3/uL (ref 0.0–0.1)
Basophils Relative: 0 %
Eosinophils Absolute: 0.1 10*3/uL (ref 0.0–0.5)
Eosinophils Relative: 1 %
HCT: 40 % (ref 39.0–52.0)
Hemoglobin: 12.9 g/dL — ABNORMAL LOW (ref 13.0–17.0)
Immature Granulocytes: 0 %
Lymphocytes Relative: 30 %
Lymphs Abs: 2.5 10*3/uL (ref 0.7–4.0)
MCH: 30 pg (ref 26.0–34.0)
MCHC: 32.3 g/dL (ref 30.0–36.0)
MCV: 93 fL (ref 80.0–100.0)
Monocytes Absolute: 0.6 10*3/uL (ref 0.1–1.0)
Monocytes Relative: 7 %
Neutro Abs: 5.2 10*3/uL (ref 1.7–7.7)
Neutrophils Relative %: 62 %
Platelets: 346 10*3/uL (ref 150–400)
RBC: 4.3 MIL/uL (ref 4.22–5.81)
RDW: 14.7 % (ref 11.5–15.5)
WBC: 8.4 10*3/uL (ref 4.0–10.5)
nRBC: 0 % (ref 0.0–0.2)

## 2019-11-27 LAB — COMPREHENSIVE METABOLIC PANEL
ALT: 26 U/L (ref 0–44)
AST: 24 U/L (ref 15–41)
Albumin: 4 g/dL (ref 3.5–5.0)
Alkaline Phosphatase: 86 U/L (ref 38–126)
Anion gap: 7 (ref 5–15)
BUN: 15 mg/dL (ref 6–20)
CO2: 23 mmol/L (ref 22–32)
Calcium: 8.9 mg/dL (ref 8.9–10.3)
Chloride: 107 mmol/L (ref 98–111)
Creatinine, Ser: 0.9 mg/dL (ref 0.61–1.24)
GFR calc Af Amer: >60 mL/min (ref >60)
GFR calc non Af Amer: >60 mL/min (ref >60)
Glucose, Bld: 134 mg/dL — ABNORMAL HIGH (ref 70–99)
Potassium: 3.6 mmol/L (ref 3.5–5.1)
Sodium: 137 mmol/L (ref 135–145)
Total Bilirubin: 0.3 mg/dL (ref 0.3–1.2)
Total Protein: 7.6 g/dL (ref 6.5–8.1)

## 2019-11-27 LAB — TROPONIN I (HIGH SENSITIVITY)
Troponin I (High Sensitivity): 5 ng/L (ref <18)
Troponin I (High Sensitivity): 5 ng/L (ref <18)

## 2019-11-27 MED ORDER — OXYCODONE-ACETAMINOPHEN 5-325 MG PO TABS
1.5000 | ORAL_TABLET | Freq: Once | ORAL | Status: AC
  Administered 2019-11-27: 23:00:00 1.5 via ORAL
  Filled 2019-11-27: qty 2

## 2019-11-27 MED ORDER — OXYCODONE-ACETAMINOPHEN 5-325 MG PO TABS
1.5000 | ORAL_TABLET | Freq: Once | ORAL | Status: DC
  Filled 2019-11-27: qty 2

## 2019-11-27 NOTE — ED Provider Notes (Signed)
Emergency Department Provider Note   I have reviewed the triage vital signs and the nursing notes.   HISTORY  Chief Complaint Chest Pain   HPI Willie Howe is a 59 y.o. male with past medical history of DVT thought to be provoked by surgery but no longer anticoagulated presents to the  emergency department for evaluation of central chest pressure with dyspnea.  He has had 2 days of symptoms describes the pain as dull and feeling like something is sitting on his chest.  He denies fevers or cough.  He had rotator cuff surgery 2 weeks ago and has been recovering normally since then.  He notes a remote history of DVT for which she was placed on Coumadin.  He has been off of the medication for years.  He denies any swelling or pain in the legs.  He is not experiencing abdominal or back pain.  No prior history of ACS. No radiation of symptoms or other modifying factors.   Past Medical History:  Diagnosis Date  . Anginal pain ([place])   . Arthritis   . BPPV (benign paroxysmal positional vertigo)   . Diabetes mellitus without complication ([place])   . DVT (deep venous thrombosis) ([place])   . Dysrhythmia   . Graves disease 1996  . History of kidney stones   . History of pulmonary embolism   . Obesity   . Previous back surgery    x 3  . Rheumatoid arthritis ([place])   . Sleep apnea   . Thyroid disease    Graves Disease    Patient Active Problem List   Diagnosis Date Noted  . Nontraumatic complete tear of left rotator cuff   . Hypertension 08/31/2019  . S/P left rotator cuff repair/ biceps tenodesis 02/15/19  03/01/2019  . Labral tear of [place] head of left biceps tendon   . Partial tear of left subscapularis tendon   . Traumatic complete tear of left rotator cuff   . Family history of malignant neoplasm of prostate 07/28/2018  . History of Graves' disease 11/11/2017  . S/P total knee replacement, right 02/02/15 08/12/2017  . S/P total knee replacement, left 07/24/16 08/12/2017  .  Hypercholesterolemia 11/08/2015  . History of DVT (deep vein thrombosis) 11/04/2015  . History of cocaine abuse ([place]) 11/04/2015  . History of heroin abuse ([place]) 11/04/2015  . Obesity 11/04/2015  . Parasomnia 11/04/2015  . Benign paroxysmal positional vertigo 10/29/2015  . Controlled type 2 diabetes mellitus without complication ([place]) [id number]  . Elevated prostate specific antigen (PSA) 10/29/2015  . HX: Armella Stogner term anticoagulant use 10/29/2015  . Sensorineural hearing loss 10/29/2015  . Sleep apnea with use of continuous positive airway pressure (CPAP) 10/29/2015  . Chronic radicular lumbar pain 10/29/2015  . Arthrofibrosis of total knee arthroplasty ([place])   . Pulmonary embolus ([place]) 01/09/2015  . History of pulmonary embolism 01/09/2015  . UTI (lower urinary tract infection) 02/21/2014  . Other headache syndrome 02/21/2014  . Nausea with vomiting 02/21/2014  . Rheumatoid arthritis ([place]) 02/21/2014  . Nausea & vomiting 02/21/2014  . Headache(784.0) 02/21/2014  . Lumbar spinal stenosis 02/10/2014    Past Surgical History:  Procedure Laterality Date  . BACK SURGERY  2015  . COLONOSCOPY    . EXAM UNDER ANESTHESIA WITH MANIPULATION OF KNEE Right 04/18/2015   Procedure: MANIPULATION OF RIGHT KNEE UNDER ANESTHESIA;  Surgeon: Carole Civil, MD;  Location: [facility];  Service: Orthopedics;  Laterality: Right;  . IVC Filter  removed December 2016  . [place]  2010  . KNEE ARTHROSCOPY Right 2006   meniscus   . KNEE ARTHROSCOPY WITH MEDIAL MENISECTOMY Left 04/24/2016   Procedure: LEFT KNEE ARTHROSCOPY WITH PARTIAL MEDIAL MENISECTOMY;  Surgeon: Carole Civil, MD;  Location: [facility];  Service: Orthopedics;  Laterality: Left;  . KNEE SURGERY     x 2  . KNEE SURGERY Left 1976   ?ligament repair   . LUMBAR WOUND DEBRIDEMENT N/A 02/23/2014   Procedure: LUMBAR WOUND DEBRIDEMENT;  Surgeon: Faythe Ghee, MD;  Location: [facility];  Service: Neurosurgery;  Laterality: N/A;   exploration lumbar wound. repair of dural defect.  Marland Kitchen MENISECTOMY Right    open medial   . NEPHROLITHOTOMY    . PERIPHERAL VASCULAR CATHETERIZATION N/A 01/30/2015   Procedure: IVC Filter Insertion;  Surgeon: Serafina Mitchell, MD;  Location: [facility];  Service: Cardiovascular;  Laterality: N/A;  . PERIPHERAL VASCULAR CATHETERIZATION N/A 04/24/2015   Procedure: IVC Filter Removal;  Surgeon: Serafina Mitchell, MD;  Location: [facility];  Service: Cardiovascular;  Laterality: N/A;  . PERIPHERAL VASCULAR CATHETERIZATION N/A 08/14/2015   Procedure: IVC Filter Removal;  Surgeon: Serafina Mitchell, MD;  Location: [facility];  Service: Cardiovascular;  Laterality: N/A;  . SHOULDER ARTHROSCOPY WITH OPEN ROTATOR CUFF REPAIR Left 11/10/2019   Procedure: SHOULDER ARTHROSCOPY WITH OPEN ROTATOR CUFF REPAIR;  Surgeon: Carole Civil, MD;  Location: [facility];  Service: Orthopedics;  Laterality: Left;  pt tested + on 2/23, does not need Covid test < 90 days  . SHOULDER ARTHROSCOPY WITH ROTATOR CUFF REPAIR AND OPEN BICEPS TENODESIS Left 02/15/2019   Procedure: SHOULDER ARTHROSCOPY WITH open ROTATOR CUFF REPAIR AND OPEN BICEPS TENODESIS;  Surgeon: Carole Civil, MD;  Location: [facility];  Service: Orthopedics;  Laterality: Left;  . SPINE SURGERY  2015   Dr Hal Neer Fusion   . SPINE SURGERY  1999   discectomy  . TOTAL KNEE ARTHROPLASTY Right 02/02/2015   Procedure:  RIGHT TOTAL KNEE ARTHROPLASTY;  Surgeon: Carole Civil, MD;  Location: [facility];  Service: Orthopedics;  Laterality: Right;  LM with pt's daughter of new arrival time (10:45)    . TOTAL KNEE ARTHROPLASTY Left 07/24/2016   Procedure: TOTAL KNEE ARTHROPLASTY;  Surgeon: Carole Civil, MD;  Location: [facility];  Service: Orthopedics;  Laterality: Left;    Allergies Statins and Lisinopril  Family History  Problem Relation Age of Onset  . Deep vein thrombosis Mother   . Hypertension Mother   . Breast cancer Mother   .  Hypertension Father   . Prostate cancer Father   . CAD Sister     Social History Social History   Tobacco Use  . Smoking status: Former Smoker    Packs/day: 1.50    Years: 20.00    Pack years: 30.00    Types: Cigarettes    Quit date: 02/03/1995    Years since quitting: 24.8  . Smokeless tobacco: Never Used  Substance Use Topics  . Alcohol use: No    Alcohol/week: 0.0 standard drinks  . Drug use: No    Review of Systems  Constitutional: No fever/chills Eyes: No visual changes. ENT: No sore throat. Cardiovascular: Positive chest pain. Respiratory: Positive shortness of breath. Gastrointestinal: No abdominal pain.  No nausea, no vomiting.  No diarrhea.  No constipation. Genitourinary: Negative for dysuria. Musculoskeletal: Negative for back pain. Skin: Negative for rash. Neurological: Negative for headaches, focal weakness  or numbness.  10-point ROS otherwise negative.  ____________________________________________   PHYSICAL EXAM:  VITAL SIGNS: ED Triage Vitals  Enc Vitals Group     BP 11/27/19 2038 (!) 155/78     Pulse --      Resp 11/27/19 2038 16     Temp 11/27/19 2040 98.1 F (36.7 C)     Temp Source 11/27/19 2040 Oral     SpO2 --      Weight 11/27/19 2037 275 lb (124.7 kg)     Height 11/27/19 2037 6\' 3"  (1.905 m)   Constitutional: Alert and oriented. Well appearing and in no acute distress. Eyes: Conjunctivae are normal.  Head: Atraumatic. Nose: No congestion/rhinnorhea. Mouth/Throat: Mucous membranes are moist. Neck: No stridor.   Cardiovascular: Bradycardia. Good peripheral circulation. Grossly normal heart sounds.   Respiratory: Normal respiratory effort.  No retractions. Lungs CTAB.  Gastrointestinal: Soft and nontender. No distention.  Musculoskeletal: No lower extremity tenderness with trace bilateral pitting edema. No gross deformities of extremities. Neurologic:  Normal speech and language.  Skin:  Skin is warm, dry and intact. No rash  noted.   ____________________________________________   LABS (all labs ordered are listed, but only abnormal results are displayed)  Labs Reviewed  COMPREHENSIVE METABOLIC PANEL - Abnormal; Notable for the following components:      Result Value   Glucose, Bld 134 (*)    All other components within normal limits  CBC WITH DIFFERENTIAL/PLATELET - Abnormal; Notable for the following components:   Hemoglobin 12.9 (*)    All other components within normal limits  TROPONIN I (HIGH SENSITIVITY)  TROPONIN I (HIGH SENSITIVITY)   ____________________________________________  EKG   EKG Interpretation  Date/Time:  Sunday November 27 2019 20:38:57 EDT Ventricular Rate:  47 PR Interval:    QRS Duration: 147 QT Interval:  476 QTC Calculation: 421 R Axis:   -69 Text Interpretation: Sinus bradycardia RBBB and LAFB LVH by voltage Inferior infarct, old No STEMI Confirmed by Nanda Quinton [phone]) on 11/27/2019 8:42:09 PM       ____________________________________________  RADIOLOGY  DG Chest Portable 1 View  Result Date: 11/27/2019 CLINICAL DATA:  Chest pain EXAM: PORTABLE CHEST 1 VIEW COMPARISON:  03/10/2014 FINDINGS: Mild lingular opacity. Right lung is essentially clear. No pleural effusion or pneumothorax. The heart is mildly enlarged. IMPRESSION: Mild lingular opacity, atelectasis versus pneumonia. Electronically Signed   By: Julian Hy M.D.   On: 11/27/2019 21:54    ____________________________________________   PROCEDURES  Procedure(s) performed:   Procedures  None  ____________________________________________   INITIAL IMPRESSION / ASSESSMENT AND PLAN / ED COURSE  Pertinent labs & imaging results that were available during my care of the patient were reviewed by me and considered in my medical decision making (see chart for details).   Patient presents to the emergency department for evaluation of chest discomfort with dyspnea in the setting of recent surgery.   He has history of DVT/PE provoked by surgery in the distant past and is no longer anticoagulated.  His vital signs are not as concerning for PE with normal respiratory rate, normal oxygen, bradycardia.  EKG interpreted as above and similar to prior.  Chest x-ray showing possible infiltrate at the lingula but no pneumonia symptoms.  Following with CTA of the chest to evaluate for PE as the patient is high risk and would not be adequately risk stratified with D-dimer.  He also was diagnosed with COVID-19 in February increasing his risk for PE.  Discussed this with the patient  and wife now at bedside who are in agreement with plan.  Initial troponin is within normal limits and second troponin is pending.  Of note, nursing contacted me that the patient's initial 7.5/325 oxycodone was dropped and had to be reordered.  The patient only received a single dose.   Care transferred to Dr. Wyvonnia Dusky pending CTA and repeat troponin.    ____________________________________________  FINAL CLINICAL IMPRESSION(S) / ED DIAGNOSES  Final diagnoses:  Precordial chest pain    MEDICATIONS GIVEN DURING THIS VISIT:  Medications  oxyCODONE-acetaminophen (PERCOCET/ROXICET) 5-325 MG per tablet 1.5 tablet (1.5 tablets Oral Not Given 11/27/19 2318)  oxyCODONE-acetaminophen (PERCOCET/ROXICET) 5-325 MG per tablet 1.5 tablet (1.5 tablets Oral Given 11/27/19 2329)    Note:  This document was prepared using Dragon voice recognition software and may include unintentional dictation errors.  Nanda Quinton, MD, [facility]    Viola Howe, [facility], MD 11/27/19 [phone]

## 2019-11-27 NOTE — ED Triage Notes (Signed)
Rotator surgery 2 weeks ago   2 day hx of CP and today SP severe and experiences dyspnea  describes pain as dull and feels like someone sitting on his chest

## 2019-11-28 ENCOUNTER — Observation Stay (HOSPITAL_BASED_OUTPATIENT_CLINIC_OR_DEPARTMENT_OTHER): Payer: Medicare HMO

## 2019-11-28 ENCOUNTER — Encounter (HOSPITAL_COMMUNITY): Payer: Self-pay | Admitting: Internal Medicine

## 2019-11-28 DIAGNOSIS — R072 Precordial pain: Secondary | ICD-10-CM | POA: Diagnosis not present

## 2019-11-28 DIAGNOSIS — I459 Conduction disorder, unspecified: Secondary | ICD-10-CM

## 2019-11-28 DIAGNOSIS — R001 Bradycardia, unspecified: Secondary | ICD-10-CM | POA: Diagnosis not present

## 2019-11-28 DIAGNOSIS — R9431 Abnormal electrocardiogram [ECG] [EKG]: Secondary | ICD-10-CM | POA: Diagnosis not present

## 2019-11-28 DIAGNOSIS — I200 Unstable angina: Secondary | ICD-10-CM | POA: Diagnosis not present

## 2019-11-28 DIAGNOSIS — G4733 Obstructive sleep apnea (adult) (pediatric): Secondary | ICD-10-CM

## 2019-11-28 DIAGNOSIS — R079 Chest pain, unspecified: Secondary | ICD-10-CM | POA: Diagnosis present

## 2019-11-28 LAB — CBC
HCT: 39.4 % (ref 39.0–52.0)
Hemoglobin: 12.7 g/dL — ABNORMAL LOW (ref 13.0–17.0)
MCH: 29.9 pg (ref 26.0–34.0)
MCHC: 32.2 g/dL (ref 30.0–36.0)
MCV: 92.7 fL (ref 80.0–100.0)
Platelets: 345 10*3/uL (ref 150–400)
RBC: 4.25 MIL/uL (ref 4.22–5.81)
RDW: 14.6 % (ref 11.5–15.5)
WBC: 6.9 10*3/uL (ref 4.0–10.5)
nRBC: 0 % (ref 0.0–0.2)

## 2019-11-28 LAB — BASIC METABOLIC PANEL
Anion gap: 7 (ref 5–15)
BUN: 12 mg/dL (ref 6–20)
CO2: 25 mmol/L (ref 22–32)
Calcium: 8.8 mg/dL — ABNORMAL LOW (ref 8.9–10.3)
Chloride: 105 mmol/L (ref 98–111)
Creatinine, Ser: 0.7 mg/dL (ref 0.61–1.24)
GFR calc Af Amer: >60 mL/min (ref >60)
GFR calc non Af Amer: >60 mL/min (ref >60)
Glucose, Bld: 124 mg/dL — ABNORMAL HIGH (ref 70–99)
Potassium: 3.7 mmol/L (ref 3.5–5.1)
Sodium: 137 mmol/L (ref 135–145)

## 2019-11-28 LAB — ECHOCARDIOGRAM COMPLETE
Height: 75 in
Weight: 4493.86 oz

## 2019-11-28 LAB — T4, FREE: Free T4: 0.92 ng/dL (ref 0.61–1.12)

## 2019-11-28 LAB — TROPONIN I (HIGH SENSITIVITY)
Troponin I (High Sensitivity): 6 ng/L (ref <18)
Troponin I (High Sensitivity): 5 ng/L (ref <18)

## 2019-11-28 LAB — TSH: TSH: 1.616 u[IU]/mL (ref 0.350–4.500)

## 2019-11-28 LAB — GLUCOSE, CAPILLARY: Glucose-Capillary: 96 mg/dL (ref 70–99)

## 2019-11-28 LAB — HIV ANTIBODY (ROUTINE TESTING W REFLEX): HIV Screen 4th Generation wRfx: NONREACTIVE

## 2019-11-28 MED ORDER — ACETAMINOPHEN 650 MG RE SUPP: 650.0000 mg | Freq: Four times a day (QID) | RECTAL | Status: DC | PRN

## 2019-11-28 MED ORDER — TAMSULOSIN HCL 0.4 MG PO CAPS
0.4000 mg | ORAL_CAPSULE | Freq: Every day | ORAL | Status: DC
  Administered 2019-11-28: 0.4 mg via ORAL
  Filled 2019-11-28: qty 1

## 2019-11-28 MED ORDER — TIZANIDINE HCL 4 MG PO TABS
4.0000 mg | ORAL_TABLET | Freq: Three times a day (TID) | ORAL | Status: DC
  Administered 2019-11-28 – 2019-11-29 (×5): 4 mg via ORAL
  Filled 2019-11-28 (×5): qty 1

## 2019-11-28 MED ORDER — OXYCODONE-ACETAMINOPHEN 7.5-325 MG PO TABS
1.0000 | ORAL_TABLET | Freq: Four times a day (QID) | ORAL | Status: DC | PRN
  Administered 2019-11-28: 1 via ORAL
  Filled 2019-11-28: qty 1

## 2019-11-28 MED ORDER — SODIUM CHLORIDE 0.9 % IV SOLN: 250.0000 mL | INTRAVENOUS | Status: DC | PRN

## 2019-11-28 MED ORDER — SODIUM CHLORIDE 0.9% FLUSH
3.0000 mL | Freq: Two times a day (BID) | INTRAVENOUS | Status: DC
  Administered 2019-11-28: 3 mL via INTRAVENOUS

## 2019-11-28 MED ORDER — ASPIRIN EC 81 MG PO TBEC
81.0000 mg | DELAYED_RELEASE_TABLET | Freq: Every day | ORAL | Status: DC
  Administered 2019-11-28: 81 mg via ORAL
  Filled 2019-11-28: qty 1

## 2019-11-28 MED ORDER — ONDANSETRON HCL 4 MG/2ML IJ SOLN: 4.0000 mg | Freq: Four times a day (QID) | INTRAMUSCULAR | Status: DC | PRN

## 2019-11-28 MED ORDER — SODIUM CHLORIDE 0.9 % WEIGHT BASED INFUSION
1.0000 mL/kg/h | INTRAVENOUS | Status: DC
  Administered 2019-11-29: 1 mL/kg/h via INTRAVENOUS

## 2019-11-28 MED ORDER — SODIUM CHLORIDE 0.9 % WEIGHT BASED INFUSION
3.0000 mL/kg/h | INTRAVENOUS | Status: AC
  Administered 2019-11-29: 3 mL/kg/h via INTRAVENOUS

## 2019-11-28 MED ORDER — ENOXAPARIN SODIUM 60 MG/0.6ML ~~LOC~~ SOLN
60.0000 mg | SUBCUTANEOUS | Status: DC
  Administered 2019-11-28: 60 mg via SUBCUTANEOUS
  Filled 2019-11-28: qty 0.6

## 2019-11-28 MED ORDER — LOSARTAN POTASSIUM 50 MG PO TABS
50.0000 mg | ORAL_TABLET | Freq: Every day | ORAL | Status: DC
  Administered 2019-11-28: 50 mg via ORAL
  Filled 2019-11-28: qty 1

## 2019-11-28 MED ORDER — SODIUM CHLORIDE 0.9% FLUSH: 3.0000 mL | INTRAVENOUS | Status: DC | PRN

## 2019-11-28 MED ORDER — MORPHINE SULFATE (PF) 2 MG/ML IV SOLN
2.0000 mg | Freq: Once | INTRAVENOUS | Status: AC
  Administered 2019-11-28: 2 mg via INTRAVENOUS
  Filled 2019-11-28: qty 1

## 2019-11-28 MED ORDER — ASPIRIN 81 MG PO CHEW
81.0000 mg | CHEWABLE_TABLET | ORAL | Status: AC
  Administered 2019-11-29: 05:00:00 81 mg via ORAL
  Filled 2019-11-28: qty 1

## 2019-11-28 MED ORDER — IOHEXOL 350 MG/ML SOLN
100.0000 mL | Freq: Once | INTRAVENOUS | Status: AC | PRN
  Administered 2019-11-28: 100 mL via INTRAVENOUS

## 2019-11-28 MED ORDER — ONDANSETRON HCL 4 MG PO TABS: 4.0000 mg | ORAL_TABLET | Freq: Four times a day (QID) | ORAL | Status: DC | PRN

## 2019-11-28 MED ORDER — ACETAMINOPHEN 325 MG PO TABS: 650.0000 mg | ORAL_TABLET | Freq: Four times a day (QID) | ORAL | Status: DC | PRN

## 2019-11-28 MED ORDER — SODIUM CHLORIDE 0.9 % IV SOLN: Freq: Once | INTRAVENOUS | Status: AC

## 2019-11-28 MED ORDER — OXYCODONE-ACETAMINOPHEN 7.5-325 MG PO TABS
1.0000 | ORAL_TABLET | ORAL | Status: DC | PRN
  Administered 2019-11-28 (×3): 1 via ORAL
  Filled 2019-11-28 (×4): qty 1

## 2019-11-28 NOTE — H&P (View-Only) (Signed)
Cardiology Consultation:   Patient ID: [name] MRN: [id number]; DOB: May 18, 1961  Admit date: 11/27/2019 Date of Consult: 11/28/2019  Primary Care Provider: Nickola Major, MD Consulting Cardiologist: Rozann Lesches, MD  Primary Electrophysiologist:  None   Patient Profile:   Willie Howe is a 59 y.o. male with a hx of DVT, DM, OSA who is being seen today for the evaluation of chest pain, dizziness, bradycardia at the request of Dr. Humphrey Rolls.  History of Present Illness:   Willie Howe is a 59 y.o. male patient with history of DM2, DVT, OSA, obesity, thyroid disease, and rheumatoid arthritis admitted with recent onset chest pressure, dyspnea and dizziness. Troponins negative and EKG bradycardia with RBBB, CT negative PE, CXR ? Pneumonia.  Patient had rotator cuff surgery 2 weeks ago. He gets methotrexate injection every Friday for RA and usually feels poorly on Saturday. Saturday he had chest tightness, dyspnea with dizziness at rest off/on all day. It worsened Sunday and he became very dizzy when he tried to walk. History of OSA but doesn't use his CPAP regularly. He has a prior history of palpitations and has had stress tests in the past but never took meds for them. Still having mild chest tightness. He had Graves disease and was on a lot of medicine at one time but hasn't had his thyroid checked in a long time. No regular exercise. 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 Smoked 1 1/2 ppd for 12 yrs but quit 27 yrs ago.    Past Medical History:  Diagnosis Date  . BPPV (benign paroxysmal positional vertigo)   . DVT (deep venous thrombosis) ([place])   . Graves disease 1996  . History of kidney stones   . History of pulmonary embolism   . Obesity   . Rheumatoid arthritis ([place])   . Sleep apnea    Intolerant of CPAP  . Type 2 diabetes mellitus ([place])     Past Surgical History:  Procedure Laterality Date  . BACK SURGERY  2015  . COLONOSCOPY    . EXAM UNDER ANESTHESIA  WITH MANIPULATION OF KNEE Right 04/18/2015   Procedure: MANIPULATION OF RIGHT KNEE UNDER ANESTHESIA;  Surgeon: Carole Civil, MD;  Location: [facility];  Service: Orthopedics;  Laterality: Right;  . IVC Filter     removed December 2016  . [place]  2010  . KNEE ARTHROSCOPY Right 2006   meniscus   . KNEE ARTHROSCOPY WITH MEDIAL MENISECTOMY Left 04/24/2016   Procedure: LEFT KNEE ARTHROSCOPY WITH PARTIAL MEDIAL MENISECTOMY;  Surgeon: Carole Civil, MD;  Location: [facility];  Service: Orthopedics;  Laterality: Left;  . KNEE SURGERY     x 2  . KNEE SURGERY Left 1976   ?ligament repair   . LUMBAR WOUND DEBRIDEMENT N/A 02/23/2014   Procedure: LUMBAR WOUND DEBRIDEMENT;  Surgeon: Faythe Ghee, MD;  Location: [facility];  Service: Neurosurgery;  Laterality: N/A;  exploration lumbar wound. repair of dural defect.  Marland Kitchen MENISECTOMY Right    open medial   . NEPHROLITHOTOMY    . PERIPHERAL VASCULAR CATHETERIZATION N/A 01/30/2015   Procedure: IVC Filter Insertion;  Surgeon: Serafina Mitchell, MD;  Location: [facility];  Service: Cardiovascular;  Laterality: N/A;  . PERIPHERAL VASCULAR CATHETERIZATION N/A 04/24/2015   Procedure: IVC Filter Removal;  Surgeon: Serafina Mitchell, MD;  Location: [facility];  Service: Cardiovascular;  Laterality: N/A;  . PERIPHERAL VASCULAR CATHETERIZATION N/A 08/14/2015   Procedure: IVC  Filter Removal;  Surgeon: Serafina Mitchell, MD;  Location: [facility];  Service: Cardiovascular;  Laterality: N/A;  . SHOULDER ARTHROSCOPY WITH OPEN ROTATOR CUFF REPAIR Left 11/10/2019   Procedure: SHOULDER ARTHROSCOPY WITH OPEN ROTATOR CUFF REPAIR;  Surgeon: Carole Civil, MD;  Location: [facility];  Service: Orthopedics;  Laterality: Left;  pt tested + on 2/23, does not need Covid test < 90 days  . SHOULDER ARTHROSCOPY WITH ROTATOR CUFF REPAIR AND OPEN BICEPS TENODESIS Left 02/15/2019   Procedure: SHOULDER ARTHROSCOPY WITH open ROTATOR CUFF REPAIR AND OPEN BICEPS  TENODESIS;  Surgeon: Carole Civil, MD;  Location: [facility];  Service: Orthopedics;  Laterality: Left;  . SPINE SURGERY  2015   Dr Hal Neer Fusion   . SPINE SURGERY  1999   discectomy  . TOTAL KNEE ARTHROPLASTY Right 02/02/2015   Procedure:  RIGHT TOTAL KNEE ARTHROPLASTY;  Surgeon: Carole Civil, MD;  Location: [facility];  Service: Orthopedics;  Laterality: Right;  LM with pt's daughter of new arrival time (10:45)    . TOTAL KNEE ARTHROPLASTY Left 07/24/2016   Procedure: TOTAL KNEE ARTHROPLASTY;  Surgeon: Carole Civil, MD;  Location: [facility];  Service: Orthopedics;  Laterality: Left;     Home Medications:  Prior to Admission medications   Medication Sig Start Date End Date Taking? Authorizing Provider  aspirin EC 81 MG tablet Take 81 mg by mouth daily.   Yes [provider]  cetirizine (ZYRTEC) 10 MG tablet Take 10 mg by mouth daily.   Yes [provider]  docusate sodium (COLACE) 100 MG capsule Take 100 mg by mouth every other day. At night   Yes [provider]  folic acid (FOLVITE) 1 MG tablet Take 1 mg by mouth daily.   Yes [provider]  ibuprofen (ADVIL) 800 MG tablet Take 1 tablet (800 mg total) by mouth every 8 (eight) hours as needed for moderate pain. 11/10/19  Yes Carole Civil, MD  inFLIXimab-abda (RENFLEXIS IV) Inject 1 Dose into the vein every 6 (six) weeks.   Yes [provider]  losartan (COZAAR) 50 MG tablet Take 50 mg by mouth daily in the afternoon.   Yes [provider]  metFORMIN (GLUCOPHAGE-XR) 500 MG 24 hr tablet Take 500 mg by mouth 2 (two) times a day.   Yes [provider]  methotrexate 50 MG/2ML injection Inject 200 mg into the muscle every Friday.  01/05/19  Yes [provider]  oxyCODONE-acetaminophen (PERCOCET) 7.5-325 MG tablet Take 1 tablet by mouth every 4 (four) hours as needed for severe pain. 11/23/19  Yes Carole Civil, MD  predniSONE (DELTASONE) 5 MG tablet Take 5 mg  by mouth daily.   Yes [provider]  promethazine (PHENERGAN) 12.5 MG tablet Take 1 tablet (12.5 mg total) by mouth every 6 (six) hours as needed for nausea or vomiting. 11/10/19  Yes Carole Civil, MD  tamsulosin (FLOMAX) 0.4 MG CAPS capsule Take 0.4 mg by mouth at bedtime.    Yes [provider]  tiZANidine (ZANAFLEX) 4 MG tablet Take 1 tablet (4 mg total) by mouth 3 (three) times daily. 11/10/19 11/09/20 Yes Carole Civil, MD    Inpatient Medications: Scheduled Meds: . aspirin EC  81 mg Oral Daily  . enoxaparin (LOVENOX) injection  60 mg Subcutaneous Q24H  . losartan  50 mg Oral Q1500  . oxyCODONE-acetaminophen  1.5 tablet Oral Once  . tamsulosin  0.4 mg Oral QHS  . tiZANidine  4 mg Oral TID   Continuous Infusions:  PRN Meds: acetaminophen **OR** acetaminophen, ondansetron **OR** ondansetron (ZOFRAN) IV, oxyCODONE-acetaminophen  Allergies:    Allergies  Allergen Reactions  . Statins Other (See Comments)    Muscle pain  . Lisinopril Cough    Social History:   Social History   Tobacco Use  . Smoking status: Former Smoker    Packs/day: 1.50    Years: 20.00    Pack years: 30.00    Types: Cigarettes    Quit date: 02/03/1995    Years since quitting: 24.8  . Smokeless tobacco: Never Used  Substance Use Topics  . Alcohol use: No    Alcohol/week: 0.0 standard drinks  . Drug use: No    Family History:     Family History  Problem Relation Age of Onset  . Deep vein thrombosis Mother   . Hypertension Mother   . Breast cancer Mother   . Hypertension Father   . Prostate cancer Father   . CAD Sister      ROS:  Please see the history of present illness.  Review of Systems  Constitution: Negative.  HENT: Negative.   Cardiovascular: Positive for chest pain, dyspnea on exertion and palpitations.  Respiratory: Positive for sleep disturbances due to breathing.   Endocrine: Negative.   Hematologic/Lymphatic: Negative.   Musculoskeletal:  Positive for arthritis and joint pain.  Gastrointestinal: Negative.   Genitourinary: Negative.   Neurological: Positive for dizziness.   All other ROS reviewed and negative.     Physical Exam/Data:   Vitals:   11/28/19 0500 11/28/19 0515 11/28/19 0530 11/28/19 0602  BP: (!) 145/97  (!) 150/91 (!) 156/97  Pulse: 72 71 72 73  Resp: (!) 21 20 20 16   Temp:    97.7 F (36.5 C)  TempSrc:    Oral  SpO2: 97% 97% 96% 99%  Weight:    127.4 kg  Height:    6\' 3"  (1.905 m)    Intake/Output Summary (Last 24 hours) at 11/28/2019 1021 Last data filed at 11/28/2019 0900 Gross per 24 hour  Intake 163.16 ml  Output -  Net 163.16 ml   Last 3 Weights 11/28/2019 11/27/2019 11/10/2019  Weight (lbs) 280 lb 13.9 oz 275 lb 275 lb  Weight (kg) 127.4 kg 124.739 kg 124.739 kg     Body mass index is 35.11 kg/m.  General:  Obese in no acute distress HEENT: normal Lymph: no adenopathy Neck: no JVD Endocrine:  No thryomegaly Vascular: No carotid bruits; FA pulses 2+ bilaterally without bruits  Cardiac:  normal S1, S2; RRR; no murmur   Lungs:  Few crackles right lung base otherwise clear to auscultation bilaterally, no wheezing, rhonchi or rales  Abd: soft, nontender, no hepatomegaly  Ext: no edema Musculoskeletal:  No deformities, BUE and BLE strength normal and equal Skin: warm and dry  Neuro:  CNs 2-12 intact, no focal abnormalities noted Psych:  Normal affect   EKG:  The EKG was personally reviewed and demonstrates:  Sinus bradycardia at 43/m RBBB and LAFB. RBBB dates back to 2015  Telemetry:  Telemetry was personally reviewed and demonstrates:  NSR with sudden sinus bradycardia in 40's with dropped beats  Relevant CV Studies: Echo 2015 Study Conclusions   - Left ventricle: The cavity size was normal. Systolic function was    normal. The estimated ejection fraction was in the range of 60%    to 65%. Wall motion was normal; there were no regional wall    motion  abnormalities. There was an  increased relative    contribution of atrial contraction to ventricular filling.    Doppler parameters are consistent with abnormal left ventricular    relaxation (grade 1 diastolic dysfunction).  - Right ventricle: The cavity size was mildly dilated. Wall    thickness was normal. Systolic function was severely reduced.  - Pulmonary arteries: PA peak pressure: 82 mm Hg (S).    Laboratory Data:  High Sensitivity Troponin:   Recent Labs  Lab 11/27/19 2052 11/27/19 2248 11/28/19 0116 11/28/19 0306  TROPONINIHS 5 5 6 5      Chemistry Recent Labs  Lab 11/27/19 2052 11/28/19 0533  NA 137 137  K 3.6 3.7  CL 107 105  CO2 23 25  GLUCOSE 134* 124*  BUN 15 12  CREATININE 0.90 0.70  CALCIUM 8.9 8.8*  GFRNONAA >60 >60  GFRAA >60 >60  ANIONGAP 7 7    Recent Labs  Lab 11/27/19 2052  PROT 7.6  ALBUMIN 4.0  AST 24  ALT 26  ALKPHOS 86  BILITOT 0.3   Hematology Recent Labs  Lab 11/27/19 2052 11/28/19 0533  WBC 8.4 6.9  RBC 4.30 4.25  HGB 12.9* 12.7*  HCT 40.0 39.4  MCV 93.0 92.7  MCH 30.0 29.9  MCHC 32.3 32.2  RDW 14.7 14.6  PLT 346 345    Radiology/Studies:  CT Angio Chest PE W and/or Wo Contrast  Result Date: 11/28/2019 CLINICAL DATA:  Shortness of breath and chest pain x2 days. EXAM: CT ANGIOGRAPHY CHEST WITH CONTRAST TECHNIQUE: Multidetector CT imaging of the chest was performed using the standard protocol during bolus administration of intravenous contrast. Multiplanar CT image reconstructions and MIPs were obtained to evaluate the vascular anatomy. CONTRAST:  1104mL OMNIPAQUE IOHEXOL 350 MG/ML SOLN COMPARISON:  March 05, 2014 FINDINGS: Cardiovascular: Satisfactory opacification of the pulmonary arteries to the segmental level. No evidence of pulmonary embolism. Normal heart size. No pericardial effusion. Mediastinum/Nodes: There is mild right hilar lymphadenopathy. A cluster of subcentimeter right hilar calcified lymph nodes is also seen. Lungs/Pleura: Very mild  atelectasis is seen within the bilateral lower lobes. A 6 mm calcified lung nodule is seen within the posterior aspect of the right lung base. There is no evidence of a pleural effusion or pneumothorax. Upper Abdomen: No acute abnormality. Musculoskeletal: Multilevel degenerative changes seen throughout the thoracic spine. Review of the MIP images confirms the above findings. IMPRESSION: 1. No evidence of pulmonary embolism. 2. Very mild bilateral lower lobe atelectasis. Electronically Signed   By: Virgina Norfolk M.D.   On: 11/28/2019 00:46   DG Chest Portable 1 View  Result Date: 11/27/2019 CLINICAL DATA:  Chest pain EXAM: PORTABLE CHEST 1 VIEW COMPARISON:  03/10/2014 FINDINGS: Mild lingular opacity. Right lung is essentially clear. No pleural effusion or pneumothorax. The heart is mildly enlarged. IMPRESSION: Mild lingular opacity, atelectasis versus pneumonia. Electronically Signed   By: Julian Hy M.D.   On: 11/27/2019 21:54    Assessment and Plan:   1. Chest pain-off/on since Saturday, troponins negative, but tightness that is mild and concerning for unstable angina.  Will discuss ischemic testing with Dr. Domenic Polite, check echocardiogram. 2. Dizziness with chest pain and bradycardia. 3. Bradycardia in 40's with RBBB(chronic) LAFB-heart rates variable this am while sleeping with NSR and sudden dropped beats and bradycardia. Need to check Thyroid studies. Also untreated sleep apnea could be contributing. Prior history of palpitations. Check 2Decho 4. DM2 well controlled per patient 5. OSA not compliant with CPAP which can contribute  to arrhythmia  6. Obesity 7. RA on methotrexate injection every Friday   For questions or updates, please contact [place] Please consult [email or web address] for contact info under    Signed, Estella Husk, PA-C 11/28/2019 10:21 AM    Attending note:  Patient seen and examined.  I reviewed his records and updated the chart.  I also discussed the  case with Ms. Bonnell Public PA-C and agree with her findings.  [name] presents with a history of rheumatoid arthritis treated with Renflexis and methotrexate, type 2 diabetes mellitus, OSA intolerant of CPAP, and evidence of conduction system disease by ECG.  He reports recent onset chest heaviness and shortness of breath that began on Saturday at rest, intermittent throughout the day, he thought it might have been related to his methotrexate injection on Friday, but typically this does not happen.  Symptoms recurred on Sunday and were more intense, also associated with a feeling of presyncope.  He did have left rotator cuff surgery about 2 weeks ago, left arm is in a sling.  His activity has been decreased in this setting.  Under hospital observation high-sensitivity troponin I levels have been normal.  He still has a feeling of mild chest discomfort this morning.  I personally reviewed his ECGs which shows sinus bradycardia with right lateral branch block and left anterior fascicular block.  He is afebrile, systolic 0000000 to [id number], heart rates in the 70s at present although he has had bradycardia into the 40s by telemetry.  No obvious heart block.  Lungs are clear, cardiac exam with RRR and no gallop.  Lab work shows potassium 3.7, BUN 12, creatinine 0.7, hemoglobin 12.6, platelets 345, TSH 1.62.  CTA reports no evidence of pulmonary embolus with very mild lower lobe atelectasis.  6 mm calcified nodule in the posterior aspect of the right lung base.  Patient presents with symptoms concerning for unstable angina, recent onset over the weekend, not associated with abnormal high-sensitivity troponin I levels.  ECG is consistent with conduction system disease, no obvious acute changes.  Agree with echocardiogram to assess cardiac structure and function.  Also discussed transfer to [facility] on the hospitalist team in anticipation of a diagnostic cardiac catheterization.  He is in agreement to proceed.   Satira Sark, M.D., F.A.C.C.

## 2019-11-28 NOTE — Care Management Obs Status (Signed)
[facility] NOTIFICATION   Patient Details  Name: Willie Howe MRN: [id number] Date of Birth: April 30, 1961   Medicare Observation Status Notification Given:  Yes    Tommy Medal 11/28/2019, 4:00 PM

## 2019-11-28 NOTE — H&P (Signed)
History and Physical    Willie Howe [id number] DOB: 10-27-1960 DOA: 11/27/2019  PCP: Nickola Major, MD (Confirm with patient/family/NH records and if not entered, this has to be entered at [facility] point of entry) Patient coming from: Home  I have personally briefly reviewed patient's old medical records in [place]  Chief Complaint: Chest tightness  HPI: Willie Howe is a 59 y.o. male with medical history significant of DVT, diabetes mellitus, sleep apnea, thyroid disease and obesity presented to ED for evaluation of chest pressure with dyspnea and dizziness.  Patient had no previous history of coronary artery disease.  In the ED 2 sets of troponin were negative and the first EKG showed normal sinus rhythm with bradycardia and right bundle branch block but no acute ischemic changes.  CT chest PE was also done that was negative for pulmonary embolism and chest x-ray showed questionable atelectasis versus pneumonia.  Patient was managed with 2 doses of oxycodone-acetaminophen and the chest discomfort resolved but patient continued to complaining of dizziness and the second EKG showed sinus bradycardia with heart rate of 40 bpm and unchanged right bundle branch block.  It is not on any AV nodal blockers at home.  Patient is admitted for cardiology evaluation for symptomatic bradycardia and the heart rate is hovering between 40 to 70 bpm.  During my evaluation patient is laying comfortably in the bed and denies any chest discomfort.  Patient also denies fever, chills, headache, dizziness, nausea, vomiting, abdominal pain, edema of lower extremities and urinary symptoms.  Patient states that he was 75 COVID-19 infection in February and all the symptoms are completely resolved.  Patient also denies tobacco consumption.  Patient is admitted for observation and evaluation by cardiology.  ED Course: In ED both EKG showed sinus bradycardia and troponin were negative.  CBC and chemistry within  normal limits.  Patient was given 2 doses of hydrocodone-acetaminophen and chest pain completely resolved.  Review of Systems: As per HPI otherwise 10 point review of systems negative.    Past Medical History:  Diagnosis Date  . Anginal pain ([place])   . Arthritis   . BPPV (benign paroxysmal positional vertigo)   . Diabetes mellitus without complication ([place])   . DVT (deep venous thrombosis) ([place])   . Dysrhythmia   . Graves disease 1996  . History of kidney stones   . History of pulmonary embolism   . Obesity   . Previous back surgery    x 3  . Rheumatoid arthritis ([place])   . Sleep apnea   . Thyroid disease    Graves Disease    Past Surgical History:  Procedure Laterality Date  . BACK SURGERY  2015  . COLONOSCOPY    . EXAM UNDER ANESTHESIA WITH MANIPULATION OF KNEE Right 04/18/2015   Procedure: MANIPULATION OF RIGHT KNEE UNDER ANESTHESIA;  Surgeon: Carole Civil, MD;  Location: [facility];  Service: Orthopedics;  Laterality: Right;  . IVC Filter     removed December 2016  . [place]  2010  . KNEE ARTHROSCOPY Right 2006   meniscus   . KNEE ARTHROSCOPY WITH MEDIAL MENISECTOMY Left 04/24/2016   Procedure: LEFT KNEE ARTHROSCOPY WITH PARTIAL MEDIAL MENISECTOMY;  Surgeon: Carole Civil, MD;  Location: [facility];  Service: Orthopedics;  Laterality: Left;  . KNEE SURGERY     x 2  . KNEE SURGERY Left 1976   ?ligament repair   . LUMBAR WOUND DEBRIDEMENT N/A 02/23/2014  Procedure: LUMBAR WOUND DEBRIDEMENT;  Surgeon: Faythe Ghee, MD;  Location: [facility];  Service: Neurosurgery;  Laterality: N/A;  exploration lumbar wound. repair of dural defect.  Marland Kitchen MENISECTOMY Right    open medial   . NEPHROLITHOTOMY    . PERIPHERAL VASCULAR CATHETERIZATION N/A 01/30/2015   Procedure: IVC Filter Insertion;  Surgeon: Serafina Mitchell, MD;  Location: [facility] [facility];  Service: Cardiovascular;  Laterality: N/A;  . PERIPHERAL VASCULAR CATHETERIZATION N/A 04/24/2015   Procedure: IVC Filter  Removal;  Surgeon: Serafina Mitchell, MD;  Location: [place] [facility];  Service: Cardiovascular;  Laterality: N/A;  . PERIPHERAL VASCULAR CATHETERIZATION N/A 08/14/2015   Procedure: IVC Filter Removal;  Surgeon: Serafina Mitchell, MD;  Location: [place] [facility];  Service: Cardiovascular;  Laterality: N/A;  . SHOULDER ARTHROSCOPY WITH OPEN ROTATOR CUFF REPAIR Left 11/10/2019   Procedure: SHOULDER ARTHROSCOPY WITH OPEN ROTATOR CUFF REPAIR;  Surgeon: Carole Civil, MD;  Location: [facility];  Service: Orthopedics;  Laterality: Left;  pt tested + on 2/23, does not need Covid test < 90 days  . SHOULDER ARTHROSCOPY WITH ROTATOR CUFF REPAIR AND OPEN BICEPS TENODESIS Left 02/15/2019   Procedure: SHOULDER ARTHROSCOPY WITH open ROTATOR CUFF REPAIR AND OPEN BICEPS TENODESIS;  Surgeon: Carole Civil, MD;  Location: [facility];  Service: Orthopedics;  Laterality: Left;  . SPINE SURGERY  2015   Dr Hal Neer Fusion   . SPINE SURGERY  1999   discectomy  . TOTAL KNEE ARTHROPLASTY Right 02/02/2015   Procedure:  RIGHT TOTAL KNEE ARTHROPLASTY;  Surgeon: Carole Civil, MD;  Location: [facility];  Service: Orthopedics;  Laterality: Right;  LM with pt's daughter of new arrival time (10:45)    . TOTAL KNEE ARTHROPLASTY Left 07/24/2016   Procedure: TOTAL KNEE ARTHROPLASTY;  Surgeon: Carole Civil, MD;  Location: [facility];  Service: Orthopedics;  Laterality: Left;     reports that he quit smoking about 24 years ago. His smoking use included cigarettes. He has a 30.00 pack-year smoking history. He has never used smokeless tobacco. He reports that he does not drink alcohol or use drugs.  Allergies  Allergen Reactions  . Statins Other (See Comments)    Muscle pain  . Lisinopril Cough    Family History  Problem Relation Age of Onset  . Deep vein thrombosis Mother   . Hypertension Mother   . Breast cancer Mother   . Hypertension Father   . Prostate cancer Father   . CAD Sister       Prior to Admission  medications   Medication Sig Start Date End Date Taking? Authorizing Provider  aspirin EC 81 MG tablet Take 81 mg by mouth daily.   Yes [provider]  cetirizine (ZYRTEC) 10 MG tablet Take 10 mg by mouth daily.   Yes [provider]  docusate sodium (COLACE) 100 MG capsule Take 100 mg by mouth every other day. At night   Yes [provider]  folic acid (FOLVITE) 1 MG tablet Take 1 mg by mouth daily.   Yes [provider]  ibuprofen (ADVIL) 800 MG tablet Take 1 tablet (800 mg total) by mouth every 8 (eight) hours as needed for moderate pain. 11/10/19  Yes Carole Civil, MD  inFLIXimab-abda (RENFLEXIS IV) Inject 1 Dose into the vein every 6 (six) weeks.   Yes [provider]  losartan (COZAAR) 50 MG tablet Take 50 mg by mouth daily in the afternoon.   Yes [provider]  metFORMIN (GLUCOPHAGE-XR) 500 MG 24 hr tablet Take 500 mg by mouth 2 (two) times a day.   Yes [provider]  methotrexate 50 MG/2ML injection Inject 200 mg into the muscle every Friday.  01/05/19  Yes [provider]  oxyCODONE-acetaminophen (PERCOCET) 7.5-325 MG tablet Take 1 tablet by mouth every 4 (four) hours as needed for severe pain. 11/23/19  Yes Carole Civil, MD  predniSONE (DELTASONE) 5 MG tablet Take 5 mg by mouth daily.   Yes [provider]  promethazine (PHENERGAN) 12.5 MG tablet Take 1 tablet (12.5 mg total) by mouth every 6 (six) hours as needed for nausea or vomiting. 11/10/19  Yes Carole Civil, MD  tamsulosin (FLOMAX) 0.4 MG CAPS capsule Take 0.4 mg by mouth at bedtime.    Yes [provider]  tiZANidine (ZANAFLEX) 4 MG tablet Take 1 tablet (4 mg total) by mouth 3 (three) times daily. 11/10/19 11/09/20 Yes Carole Civil, MD    Physical Exam: Vitals:   11/27/19 2100 11/28/19 0200 11/28/19 0215 11/28/19 0230  BP: (!) 147/87 131/80  (!) 163/88  Pulse: (!) 47 69 69 69  Resp: (!) 25 18 18 20   Temp:        TempSrc:      SpO2: 97% 97% 97% 99%  Weight:      Height:        Constitutional: NAD, calm, comfortable Vitals:   11/27/19 2100 11/28/19 0200 11/28/19 0215 11/28/19 0230  BP: (!) 147/87 131/80  (!) 163/88  Pulse: (!) 47 69 69 69  Resp: (!) 25 18 18 20   Temp:      TempSrc:      SpO2: 97% 97% 97% 99%  Weight:      Height:        General: Patient is a 59 year old obese African-American male laying comfortably in the bed in no acute distress. Eyes: PERRL, lids and conjunctivae normal ENMT: Mucous membranes are moist. Posterior pharynx clear of any exudate or lesions.Normal dentition.  Neck: normal, supple, no masses, no thyromegaly Respiratory: clear to auscultation bilaterally, no wheezing, no crackles. Normal respiratory effort. No accessory muscle use.  Cardiovascular: Chest is nontender on palpation.  Regular rate and rhythm, no murmurs / rubs / gallops. No extremity edema. 2+ pedal pulses. No carotid bruits.  Abdomen: no tenderness, no masses palpated. No hepatosplenomegaly. Bowel sounds positive.  Musculoskeletal: no clubbing / cyanosis. No joint deformity upper and lower extremities. Good ROM, no contractures. Normal muscle tone.  Skin: no rashes, lesions, ulcers. No induration Neurologic: CN 2-12 grossly intact. Sensation intact, DTR normal. Strength 5/5 in all 4.  Psychiatric: Normal judgment and insight. Alert and oriented x 3. Normal mood.   (Anything < 9 systems with 2 bullets each down codes to level 1) (If patient refuses exam can't bill higher level) (Make sure to document decubitus ulcers present on admission -- if possible -- and whether patient has chronic indwelling catheter at time of admission)  Labs on Admission: I have personally reviewed following labs and imaging studies  CBC: Recent Labs  Lab 11/27/19 2052  WBC 8.4  NEUTROABS 5.2  HGB 12.9*  HCT 40.0  MCV 93.0  PLT 123456   Basic Metabolic Panel: Recent Labs  Lab 11/27/19 2052  NA 137  K 3.6   CL 107  CO2 23  GLUCOSE 134*  BUN 15  CREATININE 0.90  CALCIUM 8.9   GFR: Estimated Creatinine Clearance: 124.2 mL/min (by C-G formula based  on SCr of 0.9 mg/dL). Liver Function Tests: Recent Labs  Lab 11/27/19 2052  AST 24  ALT 26  ALKPHOS 86  BILITOT 0.3  PROT 7.6  ALBUMIN 4.0   No results for input(s): LIPASE, AMYLASE in the last 168 hours. No results for input(s): AMMONIA in the last 168 hours. Coagulation Profile: No results for input(s): INR, PROTIME in the last 168 hours. Cardiac Enzymes: No results for input(s): CKTOTAL, CKMB, CKMBINDEX, TROPONINI in the last 168 hours. BNP (last 3 results) No results for input(s): PROBNP in the last 8760 hours. HbA1C: No results for input(s): HGBA1C in the last 72 hours. CBG: No results for input(s): GLUCAP in the last 168 hours. Lipid Profile: No results for input(s): CHOL, HDL, LDLCALC, TRIG, CHOLHDL, LDLDIRECT in the last 72 hours. Thyroid Function Tests: No results for input(s): TSH, T4TOTAL, FREET4, T3FREE, THYROIDAB in the last 72 hours. Anemia Panel: No results for input(s): VITAMINB12, FOLATE, FERRITIN, TIBC, IRON, RETICCTPCT in the last 72 hours. Urine analysis:    Component Value Date/Time   COLORURINE YELLOW 02/20/2014 2307   APPEARANCEUR CLOUDY (A) 02/20/2014 2307   LABSPEC 1.020 02/20/2014 2307   PHURINE 7.0 02/20/2014 2307   GLUCOSEU NEGATIVE 02/20/2014 2307   HGBUR NEGATIVE 02/20/2014 2307   BILIRUBINUR NEGATIVE 02/20/2014 [place] 02/20/2014 2307   PROTEINUR NEGATIVE 02/20/2014 2307   UROBILINOGEN 1.0 02/20/2014 2307   NITRITE POSITIVE (A) 02/20/2014 2307   LEUKOCYTESUR MODERATE (A) 02/20/2014 2307    Radiological Exams on Admission: CT Angio Chest PE W and/or Wo Contrast  Result Date: 11/28/2019 CLINICAL DATA:  Shortness of breath and chest pain x2 days. EXAM: CT ANGIOGRAPHY CHEST WITH CONTRAST TECHNIQUE: Multidetector CT imaging of the chest was performed using the standard  protocol during bolus administration of intravenous contrast. Multiplanar CT image reconstructions and MIPs were obtained to evaluate the vascular anatomy. CONTRAST:  176mL OMNIPAQUE IOHEXOL 350 MG/ML SOLN COMPARISON:  March 05, 2014 FINDINGS: Cardiovascular: Satisfactory opacification of the pulmonary arteries to the segmental level. No evidence of pulmonary embolism. Normal heart size. No pericardial effusion. Mediastinum/Nodes: There is mild right hilar lymphadenopathy. A cluster of subcentimeter right hilar calcified lymph nodes is also seen. Lungs/Pleura: Very mild atelectasis is seen within the bilateral lower lobes. A 6 mm calcified lung nodule is seen within the posterior aspect of the right lung base. There is no evidence of a pleural effusion or pneumothorax. Upper Abdomen: No acute abnormality. Musculoskeletal: Multilevel degenerative changes seen throughout the thoracic spine. Review of the MIP images confirms the above findings. IMPRESSION: 1. No evidence of pulmonary embolism. 2. Very mild bilateral lower lobe atelectasis. Electronically Signed   By: Virgina Norfolk M.D.   On: 11/28/2019 00:46   DG Chest Portable 1 View  Result Date: 11/27/2019 CLINICAL DATA:  Chest pain EXAM: PORTABLE CHEST 1 VIEW COMPARISON:  03/10/2014 FINDINGS: Mild lingular opacity. Right lung is essentially clear. No pleural effusion or pneumothorax. The heart is mildly enlarged. IMPRESSION: Mild lingular opacity, atelectasis versus pneumonia. Electronically Signed   By: Julian Hy M.D.   On: 11/27/2019 21:54    EKG: Independently reviewed.  Sinus bradycardia with right bundle branch block.  Assessment/Plan Principal Problem:   Chest pain Chest pain is resolved. 2 sets of troponin negative and EKG was also negative for acute ischemic changes. Patient admitted to telemetry. Cardiology consult ordered and patient will be evaluated in the morning.  Sitting supplementation with nasal cannula as  needed  Active Problems:  Symptomatic bradycardia  Patient was complaining of severe dizziness and his heart rate was running between 40 bpm to 70 bpm and both EKGs were positive for sinus bradycardia. Continue cardiac monitoring and heart rate is 74 bpm at this time.  Patient not complaining of any dizziness or chest pain at this time.    Obesity Patient counseled about adopting healthy lifestyle.    Continue home medications for chronic medical problems    DVT prophylaxis: Lovenox Code Status: Full code Consults called: Cardiology consult Admission status: Observation/telemetry   Edmonia Lynch MD [facility] Pager [phone]   If 7PM-7AM, please contact night-coverage [email or web address] Password [facility]  11/28/2019, 4:19 AM

## 2019-11-28 NOTE — Progress Notes (Signed)
Patient transported with [facility] to [facility]. Vitals obtained.

## 2019-11-28 NOTE — Progress Notes (Signed)
[facility] given report to transfer patient to [facility], estimated arrival time 20-25 min. Patient updated and stated he will update his wife. Report given to [place] at [facility].

## 2019-11-28 NOTE — Consult Note (Addendum)
Cardiology Consultation:   Patient ID: TUSTIN BOARD MRN: [id number]; DOB: 1961/06/03  Admit date: 11/27/2019 Date of Consult: 11/28/2019  Primary Care Provider: Nickola Major, MD Consulting Cardiologist: Rozann Lesches, MD  Primary Electrophysiologist:  None   Patient Profile:   Willie Howe is a 59 y.o. male with a hx of DVT, DM, OSA who is being seen today for the evaluation of chest pain, dizziness, bradycardia at the request of Dr. Humphrey Rolls.  History of Present Illness:   Willie Howe is a 59 y.o. male patient with history of DM2, DVT, OSA, obesity, thyroid disease, and rheumatoid arthritis admitted with recent onset chest pressure, dyspnea and dizziness. Troponins negative and EKG bradycardia with RBBB, CT negative PE, CXR ? Pneumonia.  Patient had rotator cuff surgery 2 weeks ago. He gets methotrexate injection every Friday for RA and usually feels poorly on Saturday. Saturday he had chest tightness, dyspnea with dizziness at rest off/on all day. It worsened Sunday and he became very dizzy when he tried to walk. History of OSA but doesn't use his CPAP regularly. He has a prior history of palpitations and has had stress tests in the past but never took meds for them. Still having mild chest tightness. He had Graves disease and was on a lot of medicine at one time but hasn't had his thyroid checked in a long time. No regular exercise. 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 Smoked 1 1/2 ppd for 12 yrs but quit 27 yrs ago.    Past Medical History:  Diagnosis Date  . BPPV (benign paroxysmal positional vertigo)   . DVT (deep venous thrombosis) ([place])   . Graves disease 1996  . History of kidney stones   . History of pulmonary embolism   . Obesity   . Rheumatoid arthritis ([place])   . Sleep apnea    Intolerant of CPAP  . Type 2 diabetes mellitus ([place])     Past Surgical History:  Procedure Laterality Date  . BACK SURGERY  2015  . COLONOSCOPY    . EXAM UNDER ANESTHESIA  WITH MANIPULATION OF KNEE Right 04/18/2015   Procedure: MANIPULATION OF RIGHT KNEE UNDER ANESTHESIA;  Surgeon: Carole Civil, MD;  Location: [facility];  Service: Orthopedics;  Laterality: Right;  . IVC Filter     removed December 2016  . [place]  2010  . KNEE ARTHROSCOPY Right 2006   meniscus   . KNEE ARTHROSCOPY WITH MEDIAL MENISECTOMY Left 04/24/2016   Procedure: LEFT KNEE ARTHROSCOPY WITH PARTIAL MEDIAL MENISECTOMY;  Surgeon: Carole Civil, MD;  Location: [facility];  Service: Orthopedics;  Laterality: Left;  . KNEE SURGERY     x 2  . KNEE SURGERY Left 1976   ?ligament repair   . LUMBAR WOUND DEBRIDEMENT N/A 02/23/2014   Procedure: LUMBAR WOUND DEBRIDEMENT;  Surgeon: Faythe Ghee, MD;  Location: [facility];  Service: Neurosurgery;  Laterality: N/A;  exploration lumbar wound. repair of dural defect.  Marland Kitchen MENISECTOMY Right    open medial   . NEPHROLITHOTOMY    . PERIPHERAL VASCULAR CATHETERIZATION N/A 01/30/2015   Procedure: IVC Filter Insertion;  Surgeon: Serafina Mitchell, MD;  Location: [facility];  Service: Cardiovascular;  Laterality: N/A;  . PERIPHERAL VASCULAR CATHETERIZATION N/A 04/24/2015   Procedure: IVC Filter Removal;  Surgeon: Serafina Mitchell, MD;  Location: [facility];  Service: Cardiovascular;  Laterality: N/A;  . PERIPHERAL VASCULAR CATHETERIZATION N/A 08/14/2015   Procedure: IVC  Filter Removal;  Surgeon: Serafina Mitchell, MD;  Location: [facility];  Service: Cardiovascular;  Laterality: N/A;  . SHOULDER ARTHROSCOPY WITH OPEN ROTATOR CUFF REPAIR Left 11/10/2019   Procedure: SHOULDER ARTHROSCOPY WITH OPEN ROTATOR CUFF REPAIR;  Surgeon: Carole Civil, MD;  Location: [facility];  Service: Orthopedics;  Laterality: Left;  pt tested + on 2/23, does not need Covid test < 90 days  . SHOULDER ARTHROSCOPY WITH ROTATOR CUFF REPAIR AND OPEN BICEPS TENODESIS Left 02/15/2019   Procedure: SHOULDER ARTHROSCOPY WITH open ROTATOR CUFF REPAIR AND OPEN BICEPS  TENODESIS;  Surgeon: Carole Civil, MD;  Location: [facility];  Service: Orthopedics;  Laterality: Left;  . SPINE SURGERY  2015   Dr Hal Neer Fusion   . SPINE SURGERY  1999   discectomy  . TOTAL KNEE ARTHROPLASTY Right 02/02/2015   Procedure:  RIGHT TOTAL KNEE ARTHROPLASTY;  Surgeon: Carole Civil, MD;  Location: [facility];  Service: Orthopedics;  Laterality: Right;  LM with pt's daughter of new arrival time (10:45)    . TOTAL KNEE ARTHROPLASTY Left 07/24/2016   Procedure: TOTAL KNEE ARTHROPLASTY;  Surgeon: Carole Civil, MD;  Location: [facility];  Service: Orthopedics;  Laterality: Left;     Home Medications:  Prior to Admission medications   Medication Sig Start Date End Date Taking? Authorizing Provider  aspirin EC 81 MG tablet Take 81 mg by mouth daily.   Yes [provider]  cetirizine (ZYRTEC) 10 MG tablet Take 10 mg by mouth daily.   Yes [provider]  docusate sodium (COLACE) 100 MG capsule Take 100 mg by mouth every other day. At night   Yes [provider]  folic acid (FOLVITE) 1 MG tablet Take 1 mg by mouth daily.   Yes [provider]  ibuprofen (ADVIL) 800 MG tablet Take 1 tablet (800 mg total) by mouth every 8 (eight) hours as needed for moderate pain. 11/10/19  Yes Carole Civil, MD  inFLIXimab-abda (RENFLEXIS IV) Inject 1 Dose into the vein every 6 (six) weeks.   Yes [provider]  losartan (COZAAR) 50 MG tablet Take 50 mg by mouth daily in the afternoon.   Yes [provider]  metFORMIN (GLUCOPHAGE-XR) 500 MG 24 hr tablet Take 500 mg by mouth 2 (two) times a day.   Yes [provider]  methotrexate 50 MG/2ML injection Inject 200 mg into the muscle every Friday.  01/05/19  Yes [provider]  oxyCODONE-acetaminophen (PERCOCET) 7.5-325 MG tablet Take 1 tablet by mouth every 4 (four) hours as needed for severe pain. 11/23/19  Yes Carole Civil, MD  predniSONE (DELTASONE) 5 MG tablet Take 5 mg  by mouth daily.   Yes [provider]  promethazine (PHENERGAN) 12.5 MG tablet Take 1 tablet (12.5 mg total) by mouth every 6 (six) hours as needed for nausea or vomiting. 11/10/19  Yes Carole Civil, MD  tamsulosin (FLOMAX) 0.4 MG CAPS capsule Take 0.4 mg by mouth at bedtime.    Yes [provider]  tiZANidine (ZANAFLEX) 4 MG tablet Take 1 tablet (4 mg total) by mouth 3 (three) times daily. 11/10/19 11/09/20 Yes Carole Civil, MD    Inpatient Medications: Scheduled Meds: . aspirin EC  81 mg Oral Daily  . enoxaparin (LOVENOX) injection  60 mg Subcutaneous Q24H  . losartan  50 mg Oral Q1500  . oxyCODONE-acetaminophen  1.5 tablet Oral Once  . tamsulosin  0.4 mg Oral QHS  . tiZANidine  4 mg Oral TID   Continuous Infusions:  PRN Meds: acetaminophen **OR** acetaminophen, ondansetron **OR** ondansetron (ZOFRAN) IV, oxyCODONE-acetaminophen  Allergies:    Allergies  Allergen Reactions  . Statins Other (See Comments)    Muscle pain  . Lisinopril Cough    Social History:   Social History   Tobacco Use  . Smoking status: Former Smoker    Packs/day: 1.50    Years: 20.00    Pack years: 30.00    Types: Cigarettes    Quit date: 02/03/1995    Years since quitting: 24.8  . Smokeless tobacco: Never Used  Substance Use Topics  . Alcohol use: No    Alcohol/week: 0.0 standard drinks  . Drug use: No    Family History:     Family History  Problem Relation Age of Onset  . Deep vein thrombosis Mother   . Hypertension Mother   . Breast cancer Mother   . Hypertension Father   . Prostate cancer Father   . CAD Sister      ROS:  Please see the history of present illness.  Review of Systems  Constitution: Negative.  HENT: Negative.   Cardiovascular: Positive for chest pain, dyspnea on exertion and palpitations.  Respiratory: Positive for sleep disturbances due to breathing.   Endocrine: Negative.   Hematologic/Lymphatic: Negative.   Musculoskeletal:  Positive for arthritis and joint pain.  Gastrointestinal: Negative.   Genitourinary: Negative.   Neurological: Positive for dizziness.   All other ROS reviewed and negative.     Physical Exam/Data:   Vitals:   11/28/19 0500 11/28/19 0515 11/28/19 0530 11/28/19 0602  BP: (!) 145/97  (!) 150/91 (!) 156/97  Pulse: 72 71 72 73  Resp: (!) 21 20 20 16   Temp:    97.7 F (36.5 C)  TempSrc:    Oral  SpO2: 97% 97% 96% 99%  Weight:    127.4 kg  Height:    6\' 3"  (1.905 m)    Intake/Output Summary (Last 24 hours) at 11/28/2019 1021 Last data filed at 11/28/2019 0900 Gross per 24 hour  Intake 163.16 ml  Output --  Net 163.16 ml   Last 3 Weights 11/28/2019 11/27/2019 11/10/2019  Weight (lbs) 280 lb 13.9 oz 275 lb 275 lb  Weight (kg) 127.4 kg 124.739 kg 124.739 kg     Body mass index is 35.11 kg/m.  General:  Obese in no acute distress HEENT: normal Lymph: no adenopathy Neck: no JVD Endocrine:  No thryomegaly Vascular: No carotid bruits; FA pulses 2+ bilaterally without bruits  Cardiac:  normal S1, S2; RRR; no murmur   Lungs:  Few crackles right lung base otherwise clear to auscultation bilaterally, no wheezing, rhonchi or rales  Abd: soft, nontender, no hepatomegaly  Ext: no edema Musculoskeletal:  No deformities, BUE and BLE strength normal and equal Skin: warm and dry  Neuro:  CNs 2-12 intact, no focal abnormalities noted Psych:  Normal affect   EKG:  The EKG was personally reviewed and demonstrates:  Sinus bradycardia at 43/m RBBB and LAFB. RBBB dates back to 2015  Telemetry:  Telemetry was personally reviewed and demonstrates:  NSR with sudden sinus bradycardia in 40's with dropped beats  Relevant CV Studies: Echo 2015 Study Conclusions   - Left ventricle: The cavity size was normal. Systolic function was    normal. The estimated ejection fraction was in the range of 60%    to 65%. Wall motion was normal; there were no regional wall    motion  abnormalities. There was an  increased relative    contribution of atrial contraction to ventricular filling.    Doppler parameters are consistent with abnormal left ventricular    relaxation (grade 1 diastolic dysfunction).  - Right ventricle: The cavity size was mildly dilated. Wall    thickness was normal. Systolic function was severely reduced.  - Pulmonary arteries: PA peak pressure: 82 mm Hg (S).    Laboratory Data:  High Sensitivity Troponin:   Recent Labs  Lab 11/27/19 2052 11/27/19 2248 11/28/19 0116 11/28/19 0306  TROPONINIHS 5 5 6 5      Chemistry Recent Labs  Lab 11/27/19 2052 11/28/19 0533  NA 137 137  K 3.6 3.7  CL 107 105  CO2 23 25  GLUCOSE 134* 124*  BUN 15 12  CREATININE 0.90 0.70  CALCIUM 8.9 8.8*  GFRNONAA >60 >60  GFRAA >60 >60  ANIONGAP 7 7    Recent Labs  Lab 11/27/19 2052  PROT 7.6  ALBUMIN 4.0  AST 24  ALT 26  ALKPHOS 86  BILITOT 0.3   Hematology Recent Labs  Lab 11/27/19 2052 11/28/19 0533  WBC 8.4 6.9  RBC 4.30 4.25  HGB 12.9* 12.7*  HCT 40.0 39.4  MCV 93.0 92.7  MCH 30.0 29.9  MCHC 32.3 32.2  RDW 14.7 14.6  PLT 346 345    Radiology/Studies:  CT Angio Chest PE W and/or Wo Contrast  Result Date: 11/28/2019 CLINICAL DATA:  Shortness of breath and chest pain x2 days. EXAM: CT ANGIOGRAPHY CHEST WITH CONTRAST TECHNIQUE: Multidetector CT imaging of the chest was performed using the standard protocol during bolus administration of intravenous contrast. Multiplanar CT image reconstructions and MIPs were obtained to evaluate the vascular anatomy. CONTRAST:  134mL OMNIPAQUE IOHEXOL 350 MG/ML SOLN COMPARISON:  March 05, 2014 FINDINGS: Cardiovascular: Satisfactory opacification of the pulmonary arteries to the segmental level. No evidence of pulmonary embolism. Normal heart size. No pericardial effusion. Mediastinum/Nodes: There is mild right hilar lymphadenopathy. A cluster of subcentimeter right hilar calcified lymph nodes is also seen. Lungs/Pleura: Very mild  atelectasis is seen within the bilateral lower lobes. A 6 mm calcified lung nodule is seen within the posterior aspect of the right lung base. There is no evidence of a pleural effusion or pneumothorax. Upper Abdomen: No acute abnormality. Musculoskeletal: Multilevel degenerative changes seen throughout the thoracic spine. Review of the MIP images confirms the above findings. IMPRESSION: 1. No evidence of pulmonary embolism. 2. Very mild bilateral lower lobe atelectasis. Electronically Signed   By: Virgina Norfolk M.D.   On: 11/28/2019 00:46   DG Chest Portable 1 View  Result Date: 11/27/2019 CLINICAL DATA:  Chest pain EXAM: PORTABLE CHEST 1 VIEW COMPARISON:  03/10/2014 FINDINGS: Mild lingular opacity. Right lung is essentially clear. No pleural effusion or pneumothorax. The heart is mildly enlarged. IMPRESSION: Mild lingular opacity, atelectasis versus pneumonia. Electronically Signed   By: Julian Hy M.D.   On: 11/27/2019 21:54    Assessment and Plan:   1. Chest pain-off/on since Saturday, troponins negative, but tightness that is mild and concerning for unstable angina.  Will discuss ischemic testing with Dr. Domenic Polite, check echocardiogram. 2. Dizziness with chest pain and bradycardia. 3. Bradycardia in 40's with RBBB(chronic) LAFB-heart rates variable this am while sleeping with NSR and sudden dropped beats and bradycardia. Need to check Thyroid studies. Also untreated sleep apnea could be contributing. Prior history of palpitations. Check 2Decho 4. DM2 well controlled per patient 5. OSA not compliant with CPAP which can contribute  to arrhythmia  6. Obesity 7. RA on methotrexate injection every Friday   For questions or updates, please contact [place] Please consult [email or web address] for contact info under    Signed, Estella Husk, PA-C 11/28/2019 10:21 AM    Attending note:  Patient seen and examined.  I reviewed his records and updated the chart.  I also discussed the  case with Ms. Bonnell Public PA-C and agree with her findings.  [name] presents with a history of rheumatoid arthritis treated with Renflexis and methotrexate, type 2 diabetes mellitus, OSA intolerant of CPAP, and evidence of conduction system disease by ECG.  He reports recent onset chest heaviness and shortness of breath that began on Saturday at rest, intermittent throughout the day, he thought it might have been related to his methotrexate injection on Friday, but typically this does not happen.  Symptoms recurred on Sunday and were more intense, also associated with a feeling of presyncope.  He did have left rotator cuff surgery about 2 weeks ago, left arm is in a sling.  His activity has been decreased in this setting.  Under hospital observation high-sensitivity troponin I levels have been normal.  He still has a feeling of mild chest discomfort this morning.  I personally reviewed his ECGs which shows sinus bradycardia with right lateral branch block and left anterior fascicular block.  He is afebrile, systolic 0000000 to [id number], heart rates in the 70s at present although he has had bradycardia into the 40s by telemetry.  No obvious heart block.  Lungs are clear, cardiac exam with RRR and no gallop.  Lab work shows potassium 3.7, BUN 12, creatinine 0.7, hemoglobin 12.6, platelets 345, TSH 1.62.  CTA reports no evidence of pulmonary embolus with very mild lower lobe atelectasis.  6 mm calcified nodule in the posterior aspect of the right lung base.  Patient presents with symptoms concerning for unstable angina, recent onset over the weekend, not associated with abnormal high-sensitivity troponin I levels.  ECG is consistent with conduction system disease, no obvious acute changes.  Agree with echocardiogram to assess cardiac structure and function.  Also discussed transfer to [facility] on the hospitalist team in anticipation of a diagnostic cardiac catheterization.  He is in agreement to  proceed.  Satira Sark, M.D., F.A.C.C.

## 2019-11-28 NOTE — Progress Notes (Signed)
Patient seen and evaluated, chart reviewed, please see EMR for updated orders. Please see full H&P dictated by admitting physician Dr Humphrey Rolls for same date of service.    Brief Summary:- 59 y.o. male who is a reformed smoker with PMHx of DM2, DVT, OSA, obesity, thyroid disease, and rheumatoid arthritis  admitted 11/28/2019 with chest pains and bradycardia -  A/p  1) chest pains and bradycardia--- ruled out for ACS by cardiac enzymes and EKG -Chest discomfort persist, CTA chest without PE or pneumonia -Cardiology consult appreciated they recommend transfer to [facility] on 11/29/2019 -EKG with old right bundle branch block and LAFB -Continue aspirin, patient is intolerant to statins, bradycardia precludes beta-blocker use -Work-up for bradycardia included thyroid studies which were WNL -Echo on 11/28/2019 with EF of 55%, prior echo from 2015 had EF of 60 to 65%  2)Obesity/OSA--- patient not compliant with CPAP, compliance advised  3) HTN--continue losartan 50 mg daily  4) rheumatoid arthritis/recent left rotator cuff surgery--- Percocet as needed, Zanaflex as scheduled PTA patient was on methotrexate  5) DM2 -.  Last A1c 6.5 reflecting excellent DM control PTA -Hold Metformin due to contrast study Use Novolog/Humalog Sliding scale insulin with Accu-Cheks/Fingersticks as ordered   Patient seen and evaluated, chart reviewed, please see EMR for updated orders. Please see full H&P dictated by admitting physician Dr Humphrey Rolls for same date of service

## 2019-11-28 NOTE — ED Provider Notes (Signed)
Care assumed from Dr. Laverta Baltimore.  Patient with chest pressure and shortness of breath for the past 2 days without fever or cough.  Recently had rotator cuff surgery and has a history of remote DVT.  EKG is unchanged.  Troponin is negative x2.  He is awaiting CT scan to rule out pulmonary embolism.  CT is negative for pulmonary embolism.  EKG remains unchanged.  Troponin negative x2  Patient still complaining of shortness of breath and dizziness and chest tightness and pressure.  While in the room his pulse is variable from the 40s to the 80s.  He is having some areas of brief pauses.  He does endorse feeling dizzy at home for several days.  He does not take any AV nodal blockers.  No recent changes in medications Repeat EKG shows sinus bradycardia in the 40s with unchanged right bundle branch block.  Patient does have symptomatic dizziness with standing and heart rate is variable from 50s to 80s.  He will have occasional dropped beats on the monitor.  He is agreeable to overnight observation for cardiology evaluation in the morning.  He is maintaining his blood pressure and mental status.  No indication for pacing at this time.  Admission d/w Dr. Humphrey Rolls.  CRITICAL CARE Performed by: Ezequiel Essex Total critical care time: 35 minutes Critical care time was exclusive of separately billable procedures and treating other patients. Critical care was necessary to treat or prevent imminent or life-threatening deterioration. Critical care was time spent personally by me on the following activities: development of treatment plan with patient and/or surrogate as well as nursing, discussions with consultants, evaluation of patient's response to treatment, examination of patient, obtaining history from patient or surrogate, ordering and performing treatments and interventions, ordering and review of laboratory studies, ordering and review of radiographic studies, pulse oximetry and re-evaluation of patient's  condition.    Ezequiel Essex, MD 11/28/19 [phone]

## 2019-11-28 NOTE — Progress Notes (Signed)
*  PRELIMINARY RESULTS* Echocardiogram 2D Echocardiogram has been performed.  Leavy Cella 11/28/2019, 11:46 AM

## 2019-11-29 ENCOUNTER — Encounter (HOSPITAL_COMMUNITY)
Admission: EM | Disposition: A | Discharge status: Home / Self Care | Payer: Self-pay | Source: Home / Self Care | Attending: Emergency Medicine

## 2019-11-29 DIAGNOSIS — R072 Precordial pain: Secondary | ICD-10-CM | POA: Diagnosis not present

## 2019-11-29 HISTORY — PX: LEFT HEART CATH AND CORONARY ANGIOGRAPHY: CATH118249

## 2019-11-29 LAB — GLUCOSE, CAPILLARY
Glucose-Capillary: 110 mg/dL — ABNORMAL HIGH (ref 70–99)
Glucose-Capillary: 130 mg/dL — ABNORMAL HIGH (ref 70–99)

## 2019-11-29 LAB — T3, FREE: T3, Free: 3 pg/mL (ref 2.0–4.4)

## 2019-11-29 LAB — PROTIME-INR
INR: 1 (ref 0.8–1.2)
Prothrombin Time: 13.4 seconds (ref 11.4–15.2)

## 2019-11-29 SURGERY — LEFT HEART CATH AND CORONARY ANGIOGRAPHY
Anesthesia: LOCAL

## 2019-11-29 MED ORDER — AMLODIPINE BESYLATE 5 MG PO TABS
5.0000 mg | ORAL_TABLET | Freq: Every day | ORAL | Status: DC
  Administered 2019-11-29: 13:00:00 5 mg via ORAL
  Filled 2019-11-29: qty 1

## 2019-11-29 MED ORDER — LOSARTAN POTASSIUM 50 MG PO TABS
100.0000 mg | ORAL_TABLET | Freq: Every day | ORAL | Status: DC
  Administered 2019-11-29: 100 mg via ORAL
  Filled 2019-11-29: qty 2

## 2019-11-29 MED ORDER — SODIUM CHLORIDE 0.9 % WEIGHT BASED INFUSION
1.0000 mL/kg/h | INTRAVENOUS | Status: AC
  Administered 2019-11-29: 1 mL/kg/h via INTRAVENOUS

## 2019-11-29 MED ORDER — SODIUM CHLORIDE 0.9% FLUSH: 3.0000 mL | Freq: Two times a day (BID) | INTRAVENOUS | Status: DC

## 2019-11-29 MED ORDER — LIDOCAINE HCL (PF) 1 % IJ SOLN
INTRAMUSCULAR | Status: AC
  Filled 2019-11-29: qty 30

## 2019-11-29 MED ORDER — HYDRALAZINE HCL 20 MG/ML IJ SOLN: 10.0000 mg | INTRAMUSCULAR | Status: AC | PRN

## 2019-11-29 MED ORDER — HEPARIN (PORCINE) IN NACL 1000-0.9 UT/500ML-% IV SOLN
INTRAVENOUS | Status: DC | PRN
  Administered 2019-11-29 (×2): 500 mL

## 2019-11-29 MED ORDER — FENTANYL CITRATE (PF) 100 MCG/2ML IJ SOLN
INTRAMUSCULAR | Status: DC | PRN
  Administered 2019-11-29 (×3): 25 ug via INTRAVENOUS

## 2019-11-29 MED ORDER — IOHEXOL 350 MG/ML SOLN
INTRAVENOUS | Status: DC | PRN
  Administered 2019-11-29: 60 mL via INTRA_ARTERIAL

## 2019-11-29 MED ORDER — METFORMIN HCL ER 500 MG PO TB24: 500.0000 mg | ORAL_TABLET | Freq: Every day | ORAL | 0 refills | Status: DC

## 2019-11-29 MED ORDER — MIDAZOLAM HCL 2 MG/2ML IJ SOLN
INTRAMUSCULAR | Status: DC | PRN
  Administered 2019-11-29 (×3): 1 mg via INTRAVENOUS

## 2019-11-29 MED ORDER — MIDAZOLAM HCL 2 MG/2ML IJ SOLN
INTRAMUSCULAR | Status: AC
  Filled 2019-11-29: qty 2

## 2019-11-29 MED ORDER — LIDOCAINE HCL (PF) 1 % IJ SOLN
INTRAMUSCULAR | Status: DC | PRN
  Administered 2019-11-29: 2 mL
  Administered 2019-11-29: 20 mL

## 2019-11-29 MED ORDER — VERAPAMIL HCL 2.5 MG/ML IV SOLN
INTRAVENOUS | Status: DC | PRN
  Administered 2019-11-29 (×2): 10 mL via INTRA_ARTERIAL

## 2019-11-29 MED ORDER — HEPARIN (PORCINE) IN NACL 1000-0.9 UT/500ML-% IV SOLN
INTRAVENOUS | Status: AC
  Filled 2019-11-29: qty 1500

## 2019-11-29 MED ORDER — VERAPAMIL HCL 2.5 MG/ML IV SOLN
INTRAVENOUS | Status: AC
  Filled 2019-11-29: qty 2

## 2019-11-29 MED ORDER — SODIUM CHLORIDE 0.9 % IV SOLN: 250.0000 mL | INTRAVENOUS | Status: DC | PRN

## 2019-11-29 MED ORDER — HEPARIN SODIUM (PORCINE) 1000 UNIT/ML IJ SOLN
INTRAMUSCULAR | Status: DC | PRN
  Administered 2019-11-29: 6000 [IU] via INTRAVENOUS

## 2019-11-29 MED ORDER — LABETALOL HCL 5 MG/ML IV SOLN: 10.0000 mg | INTRAVENOUS | Status: AC | PRN

## 2019-11-29 MED ORDER — LOSARTAN POTASSIUM 100 MG PO TABS: 100.0000 mg | ORAL_TABLET | Freq: Every day | ORAL | 0 refills | Status: DC

## 2019-11-29 MED ORDER — MORPHINE SULFATE (PF) 2 MG/ML IV SOLN
1.0000 mg | INTRAVENOUS | Status: DC | PRN
  Administered 2019-11-29: 1 mg via INTRAVENOUS
  Administered 2019-11-29 (×2): 2 mg via INTRAVENOUS
  Filled 2019-11-29 (×3): qty 1

## 2019-11-29 MED ORDER — FENTANYL CITRATE (PF) 100 MCG/2ML IJ SOLN
INTRAMUSCULAR | Status: AC
  Filled 2019-11-29: qty 2

## 2019-11-29 MED ORDER — AMLODIPINE BESYLATE 5 MG PO TABS: 5.0000 mg | ORAL_TABLET | Freq: Every day | ORAL | 0 refills | Status: DC

## 2019-11-29 MED ORDER — HEPARIN SODIUM (PORCINE) 1000 UNIT/ML IJ SOLN
INTRAMUSCULAR | Status: AC
  Filled 2019-11-29: qty 1

## 2019-11-29 MED ORDER — SODIUM CHLORIDE 0.9% FLUSH: 3.0000 mL | INTRAVENOUS | Status: DC | PRN

## 2019-11-29 SURGICAL SUPPLY — 15 items
CATH 5FR JL3.5 JR4 ANG PIG MP (CATHETERS) ×1 IMPLANT
CATH INFINITI 4FR JL3.5 (CATHETERS) ×1 IMPLANT
CLOSURE MYNX CONTROL 5F (Vascular Products) ×1 IMPLANT
DEVICE RAD COMP TR BAND LRG (VASCULAR PRODUCTS) ×1 IMPLANT
GLIDESHEATH SLEND SS 6F .021 (SHEATH) ×1 IMPLANT
GUIDEWIRE INQWIRE 1.5J.035X260 (WIRE) IMPLANT
INQWIRE 1.5J .035X260CM (WIRE) ×2
KIT HEART LEFT (KITS) ×2 IMPLANT
PACK CARDIAC CATHETERIZATION (CUSTOM PROCEDURE TRAY) ×2 IMPLANT
SHEATH PINNACLE 5F 10CM (SHEATH) ×1 IMPLANT
SHEATH PROBE COVER 6X72 (BAG) ×1 IMPLANT
TRANSDUCER W/STOPCOCK (MISCELLANEOUS) ×2 IMPLANT
TUBING CIL FLEX 10 FLL-RA (TUBING) ×2 IMPLANT
WIRE EMERALD 3MM-J .035X150CM (WIRE) ×1 IMPLANT
WIRE HI TORQ VERSACORE-J 145CM (WIRE) ×1 IMPLANT

## 2019-11-29 NOTE — Discharge Summary (Signed)
Physician Discharge Summary  Willie Howe [id number] DOB: 1961/07/09 DOA: 11/27/2019  PCP: Willie Major, MD  Admit date: 11/27/2019 Discharge date: 11/29/2019  Admitted From: Home Disposition: Home  Recommendations for Outpatient Follow-up:  1. Follow up with PCP in 1-2 weeks 2. Follow-up with cardiology 3. Please obtain BMP/CBC in one week 4. Please follow up on the following pending results:  Home Health: None Equipment/Devices: None  Discharge Condition: Stable CODE STATUS: Full code Diet recommendation: Cardiac  Subjective: Seen and examined.  Wife at the bedside.  Feels much better.  No chest pain or shortness of breath.  Brief/Interim Willie Howe is a 59 y.o. male with medical history significant of DVT, diabetes mellitus, sleep apnea, thyroid disease and obesity presented to ED for evaluation of chest pressure with dyspnea and dizziness.  Patient had no previous history of coronary artery disease.  In the ED 2 sets of troponin were negative and the first EKG showed normal sinus rhythm with bradycardia and right bundle branch block but no acute ischemic changes.  CT chest PE was also done that was negative for pulmonary embolism and chest x-ray showed questionable atelectasis versus pneumonia.  Patient was managed with 2 doses of oxycodone-acetaminophen and the chest discomfort resolved but patient continued to complaining of dizziness and the second EKG showed sinus bradycardia with heart rate of 40 bpm and unchanged right bundle branch block.  It is not on any AV nodal blockers at home. Summary: [name] was transferred from [facility] where he was seen by Willie Howe in consultation for atypical chest pain. Underwent diagnostic coronary angiography this morning by Willie Howe via the right radial approach revealing entirely normal coronary arteries.  He has normal LV function.  Per cardiology, believe his chest pain is noncardiac.  He is 2 weeks post rotator cuff  repair.  He has been cleared by cardiology and he is stable for discharge home today.  We will arrange follow-up with Willie Howe and his PCP.  Of note, patient has history of obstructive sleep apnea for which he used to use CPAP machine but has not used it for several years.  His symptoms might stem from his untreated sleep apnea.  I have strongly recommended him to follow-up with his physician to arrange updated sleep study and get a new machine.  He and his wife verbalized understanding.  Also his blood pressure remained elevated here.  He is being discharged on increased dose of losartan up to 200 mg daily and addition of 5 mg p.o. amlodipine.  Discharge Diagnoses:  Principal Problem:   Chest pain Active Problems:   Obesity   Symptomatic bradycardia    Discharge Instructions   Allergies as of 11/29/2019      Reactions   Statins Other (See Comments)   Muscle pain   Lisinopril Cough      Medication List    TAKE these medications   amLODipine 5 MG tablet Commonly known as: NORVASC Take 1 tablet (5 mg total) by mouth daily.   aspirin EC 81 MG tablet Take 81 mg by mouth daily.   cetirizine 10 MG tablet Commonly known as: ZYRTEC Take 10 mg by mouth daily.   Colace 100 MG capsule Generic drug: docusate sodium Take 100 mg by mouth every other day. At night   folic acid 1 MG tablet Commonly known as: FOLVITE Take 1 mg by mouth daily.   ibuprofen 800 MG tablet Commonly known as: ADVIL Take 1  tablet (800 mg total) by mouth every 8 (eight) hours as needed for moderate pain.   losartan 100 MG tablet Commonly known as: COZAAR Take 1 tablet (100 mg total) by mouth daily in the afternoon. What changed:   medication strength  how much to take   metFORMIN 500 MG 24 hr tablet Commonly known as: GLUCOPHAGE-XR Take 1 tablet (500 mg total) by mouth daily with breakfast. Start taking on: December 05, 2019 What changed:   when to take this  These instructions start on December 05, 2019. If you are unsure what to do until then, ask your doctor or other care provider.   methotrexate 50 MG/2ML injection Inject 200 mg into the muscle every Friday.   oxyCODONE-acetaminophen 7.5-325 MG tablet Commonly known as: Percocet Take 1 tablet by mouth every 4 (four) hours as needed for severe pain.   predniSONE 5 MG tablet Commonly known as: DELTASONE Take 5 mg by mouth daily.   promethazine 12.5 MG tablet Commonly known as: PHENERGAN Take 1 tablet (12.5 mg total) by mouth every 6 (six) hours as needed for nausea or vomiting.   RENFLEXIS IV Inject 1 Dose into the vein every 6 (six) weeks.   tamsulosin 0.4 MG Caps capsule Commonly known as: FLOMAX Take 0.4 mg by mouth at bedtime.   tiZANidine 4 MG tablet Commonly known as: Zanaflex Take 1 tablet (4 mg total) by mouth 3 (three) times daily.      Follow-up Information    Willie Burn, PA-C. Go on 12/26/2019.   Specialty: Cardiology Why: @12 :30 for hospital follow up with Dr. Myles Gip PA Contact information: [place] [address] [id number]        Willie Major, MD Follow up in 1 week(s).   Specialty: Family Medicine Contact information: [address] [place] [address] [phone]        Willie Sark, MD .   Specialty: Cardiology Contact information: [place] [address] [phone]          Allergies  Allergen Reactions  . Statins Other (See Comments)    Muscle pain  . Lisinopril Cough    Consultations: Cardiology   Procedures/Studies: CT Angio Chest PE W and/or Wo Contrast  Result Date: 11/28/2019 CLINICAL DATA:  Shortness of breath and chest pain x2 days. EXAM: CT ANGIOGRAPHY CHEST WITH CONTRAST TECHNIQUE: Multidetector CT imaging of the chest was performed using the standard protocol during bolus administration of intravenous contrast. Multiplanar CT image reconstructions and MIPs were obtained to evaluate the vascular  anatomy. CONTRAST:  139mL OMNIPAQUE IOHEXOL 350 MG/ML SOLN COMPARISON:  March 05, 2014 FINDINGS: Cardiovascular: Satisfactory opacification of the pulmonary arteries to the segmental level. No evidence of pulmonary embolism. Normal heart size. No pericardial effusion. Mediastinum/Nodes: There is mild right hilar lymphadenopathy. A cluster of subcentimeter right hilar calcified lymph nodes is also seen. Lungs/Pleura: Very mild atelectasis is seen within the bilateral lower lobes. A 6 mm calcified lung nodule is seen within the posterior aspect of the right lung base. There is no evidence of a pleural effusion or pneumothorax. Upper Abdomen: No acute abnormality. Musculoskeletal: Multilevel degenerative changes seen throughout the thoracic spine. Review of the MIP images confirms the above findings. IMPRESSION: 1. No evidence of pulmonary embolism. 2. Very mild bilateral lower lobe atelectasis. Electronically Signed   By: Virgina Norfolk M.D.   On: 11/28/2019 00:46   CARDIAC CATHETERIZATION  Result Date: [id number]  LV end diastolic pressure  is normal.  1. Normal coronary anatomy 2. Normal LV EDP Plan: anticipate DC later today.   DG Chest Portable 1 View  Result Date: 11/27/2019 CLINICAL DATA:  Chest pain EXAM: PORTABLE CHEST 1 VIEW COMPARISON:  03/10/2014 FINDINGS: Mild lingular opacity. Right lung is essentially clear. No pleural effusion or pneumothorax. The heart is mildly enlarged. IMPRESSION: Mild lingular opacity, atelectasis versus pneumonia. Electronically Signed   By: Julian Hy M.D.   On: 11/27/2019 21:54   ECHOCARDIOGRAM COMPLETE  Result Date: 11/28/2019    ECHOCARDIOGRAM REPORT   Patient Name:   JAASIEL HURLBUTT Date of Exam: 11/28/2019 Medical Rec #:  [id number]       Height:       75.0 in Accession #:    [id number]      Weight:       280.9 lb Date of Birth:  August 28, 1960        BSA:          2.535 m Patient Age:    59 years        BP:           127/82 mmHg Patient Gender: M                HR:           78 bpm. Exam Location:  [name] Procedure: 2D Echo Indications:    Abnormal ECG 794.31 / R94.31  History:        Patient has prior history of Echocardiogram examinations, most                 recent 03/05/2014. Signs/Symptoms:Chest Pain; Risk Factors:Former                 Smoker and Diabetes. Symptomatic bradycardia, History of Graves'                 disease                 History of cocaine abuse ([place])                 History of heroin abuse , History of pulmonary embolism.  Sonographer:    Leavy Cella RDCS (AE) Referring Phys: [place]  1. Images are limited.  2. Left ventricular ejection fraction, by estimation, is 55%. The left ventricle has normal function. Left ventricular endocardial border not optimally defined to evaluate regional wall motion. There is mild left ventricular hypertrophy. Left ventricular diastolic parameters are indeterminate.  3. Right ventricular systolic function is low normal. The right ventricular size is normal. Tricuspid regurgitation signal is inadequate for assessing PA pressure.  4. The mitral valve is grossly normal. No evidence of mitral valve regurgitation.  5. The aortic valve is tricuspid. Aortic valve regurgitation is not visualized.  6. The inferior vena cava is normal in size with greater than 50% respiratory variability, suggesting right atrial pressure of 3 mmHg. FINDINGS  Left Ventricle: Left ventricular ejection fraction, by estimation, is 55%. The left ventricle has normal function. Left ventricular endocardial border not optimally defined to evaluate regional wall motion. The left ventricular internal cavity size was normal in size. There is mild left ventricular hypertrophy. Left ventricular diastolic parameters are indeterminate. Right Ventricle: The right ventricular size is normal. No increase in right ventricular wall thickness. Right ventricular systolic function is low normal. Tricuspid regurgitation signal is  inadequate for assessing PA pressure. Left Atrium: Left atrial size was normal in size.  Right Atrium: Right atrial size was normal in size. Pericardium: There is no evidence of pericardial effusion. Mitral Valve: The mitral valve is grossly normal. No evidence of mitral valve regurgitation. Tricuspid Valve: The tricuspid valve is grossly normal. Tricuspid valve regurgitation is trivial. Aortic Valve: The aortic valve is tricuspid. Aortic valve regurgitation is not visualized. Mild aortic valve annular calcification. Pulmonic Valve: The pulmonic valve was not well visualized. Pulmonic valve regurgitation is not visualized. Aorta: The aortic root is normal in size and structure. Venous: The inferior vena cava is normal in size with greater than 50% respiratory variability, suggesting right atrial pressure of 3 mmHg. IAS/Shunts: No atrial level shunt detected by color flow Doppler.  LEFT VENTRICLE PLAX 2D LVIDd:         4.49 cm  Diastology LVIDs:         2.47 cm  LV e' lateral:   7.42 cm/s LV PW:         1.20 cm  LV E/e' lateral: 8.4 LV IVS:        1.25 cm  LV e' medial:    6.67 cm/s LVOT diam:     2.30 cm  LV E/e' medial:  9.4 LVOT Area:     4.15 cm  RIGHT VENTRICLE RV S prime:     12.10 cm/s LEFT ATRIUM             Index LA diam:        2.30 cm 0.91 cm/m LA Vol (A2C):   38.0 ml 14.99 ml/m LA Vol (A4C):   74.4 ml 29.35 ml/m LA Biplane Vol: 57.4 ml 22.64 ml/m   AORTA Ao Root diam: 3.30 cm MITRAL VALVE MV Area (PHT): 2.93 cm    SHUNTS MV Decel Time: 259 msec    Systemic Diam: 2.30 cm MV E velocity: 62.60 cm/s MV A velocity: 60.80 cm/s MV E/A ratio:  1.03 Rozann Lesches MD Electronically signed by Rozann Lesches MD Signature Date/Time: 11/28/2019/12:00:13 PM    Final       Discharge Exam: Vitals:   11/29/19 1132 11/29/19 1157  BP: (!) 154/84 (!) 159/89  Pulse: 60 68  Resp:    Temp:  97.6 F (36.4 C)  SpO2: 96% 98%   Vitals:   11/29/19 1002 11/29/19 1032 11/29/19 1132 11/29/19 1157  BP: (!) 166/91  (!) 145/83 (!) 154/84 (!) 159/89  Pulse: 68 70 60 68  Resp:      Temp:    97.6 F (36.4 C)  TempSrc:    Oral  SpO2: 97% 97% 96% 98%  Weight:      Height:        General: Pt is alert, awake, not in acute distress Cardiovascular: RRR, S1/S2 +, no rubs, no gallops Respiratory: CTA bilaterally, no wheezing, no rhonchi Abdominal: Soft, NT, ND, bowel sounds + Extremities: no edema, no cyanosis    The results of significant diagnostics from this hospitalization (including imaging, microbiology, ancillary and laboratory) are listed below for reference.     Microbiology: No results found for this or any previous visit (from the past 240 hour(s)).   Labs: BNP (last 3 results) No results for input(s): BNP in the last 8760 hours. Basic Metabolic Panel: Recent Labs  Lab 11/27/19 2052 11/28/19 0533  NA 137 137  K 3.6 3.7  CL 107 105  CO2 23 25  GLUCOSE 134* 124*  BUN 15 12  CREATININE 0.90 0.70  CALCIUM 8.9 8.8*   Liver Function Tests: Recent Labs  Lab 11/27/19 2052  AST 24  ALT 26  ALKPHOS 86  BILITOT 0.3  PROT 7.6  ALBUMIN 4.0   No results for input(s): LIPASE, AMYLASE in the last 168 hours. No results for input(s): AMMONIA in the last 168 hours. CBC: Recent Labs  Lab 11/27/19 2052 11/28/19 0533  WBC 8.4 6.9  NEUTROABS 5.2  --   HGB 12.9* 12.7*  HCT 40.0 39.4  MCV 93.0 92.7  PLT 346 345   Cardiac Enzymes: No results for input(s): CKTOTAL, CKMB, CKMBINDEX, TROPONINI in the last 168 hours. BNP: Invalid input(s): POCBNP CBG: Recent Labs  Lab 11/28/19 2148 11/29/19 0629 11/29/19 1105  GLUCAP 96 110* 130*   D-Dimer No results for input(s): DDIMER in the last 72 hours. Hgb A1c No results for input(s): HGBA1C in the last 72 hours. Lipid Profile No results for input(s): CHOL, HDL, LDLCALC, TRIG, CHOLHDL, LDLDIRECT in the last 72 hours. Thyroid function studies Recent Labs    11/28/19 0828 11/28/19 0932  TSH 1.616  --   T3FREE  --  3.0   Anemia  work up No results for input(s): VITAMINB12, FOLATE, FERRITIN, TIBC, IRON, RETICCTPCT in the last 72 hours. Urinalysis    Component Value Date/Time   COLORURINE YELLOW 02/20/2014 2307   APPEARANCEUR CLOUDY (A) 02/20/2014 2307   LABSPEC 1.020 02/20/2014 2307   PHURINE 7.0 02/20/2014 2307   GLUCOSEU NEGATIVE 02/20/2014 2307   HGBUR NEGATIVE 02/20/2014 2307   BILIRUBINUR NEGATIVE 02/20/2014 2307   KETONESUR NEGATIVE 02/20/2014 2307   PROTEINUR NEGATIVE 02/20/2014 2307   UROBILINOGEN 1.0 02/20/2014 2307   NITRITE POSITIVE (A) 02/20/2014 2307   LEUKOCYTESUR MODERATE (A) 02/20/2014 2307   Sepsis Labs Invalid input(s): PROCALCITONIN,  WBC,  LACTICIDVEN Microbiology No results found for this or any previous visit (from the past 240 hour(s)).   Time coordinating discharge: Over 30 minutes  SIGNED:   Darliss Cheney, MD  [facility] 11/29/2019, 1:00 PM  If 7PM-7AM, please contact night-coverage [email or web address]

## 2019-11-29 NOTE — Progress Notes (Signed)
Patient back from cath lab. Right radial TR band secure and radial pulse strong. Right femoral closure device is secure and site is soft with no hematoma. Right dorsalis pedis pulse is strong. Patient instructed on bedrest orders.

## 2019-11-29 NOTE — Progress Notes (Signed)
Patient able to tolerate sitting in the chair for 1 hr and femoral and radial site are both soft with no signs of hematoma or bleeding.  Patient walked in the hall and still remains stable. IVs removed and patient given education about puncture sites and limitations. New medications explained and follow up appointments confirmed.

## 2019-11-29 NOTE — Discharge Instructions (Signed)
Bradycardia, Adult Bradycardia is a slower-than-normal heartbeat. A normal resting heart rate for an adult ranges from 60 to 100 beats per minute. With bradycardia, the resting heart rate is less than 60 beats per minute. Bradycardia can prevent enough oxygen from reaching certain areas of your body when you are active. It can be serious if it keeps enough oxygen from reaching your brain and other parts of your body. Bradycardia is not a problem for everyone. For some healthy adults, a slow resting heart rate is normal. What are the causes? This condition may be caused by:  A problem with the heart, including: ? A problem with the heart's electrical system, such as a heart block. With a heart block, electrical signals between the chambers of the heart are partially or completely blocked, so they are not able to work as they should. ? A problem with the heart's natural pacemaker (sinus node). ? Heart disease. ? A heart attack. ? Heart damage. ? Lyme disease. ? A heart infection. ? A heart condition that is present at birth (congenital heart defect).  Certain medicines that treat heart conditions.  Certain conditions, such as hypothyroidism and obstructive sleep apnea.  Problems with the balance of chemicals and other substances, like potassium, in the blood.  Trauma.  Radiation therapy. What increases the risk? You are more likely to develop this condition if you:  Are age 65 or older.  Have high blood pressure (hypertension), high cholesterol (hyperlipidemia), or diabetes.  Drink heavily, use tobacco or nicotine products, or use drugs. What are the signs or symptoms? Symptoms of this condition include:  Light-headedness.  Feeling faint or fainting.  Fatigue and weakness.  Trouble with activity or exercise.  Shortness of breath.  Chest pain (angina).  Drowsiness.  Confusion.  Dizziness. How is this diagnosed? This condition may be diagnosed based on:  Your  symptoms.  Your medical history.  A physical exam. During the exam, your health care provider will listen to your heartbeat and check your pulse. To confirm the diagnosis, your health care provider may order tests, such as:  Blood tests.  An electrocardiogram (ECG). This test records the heart's electrical activity. The test can show how fast your heart is beating and whether the heartbeat is steady.  A test in which you wear a portable device (event recorder or Holter monitor) to record your heart's electrical activity while you go about your day.  Anexercise test. How is this treated? Treatment for this condition depends on the cause of the condition and how severe your symptoms are. Treatment may involve:  Treatment of the underlying condition.  Changing your medicines or how much medicine you take.  Having a small, battery-operated device called a pacemaker implanted under the skin. When bradycardia occurs, this device can be used to increase your heart rate and help your heart beat in a regular rhythm. Follow these instructions at home: Lifestyle   Manage any health conditions that contribute to bradycardia as told by your health care provider.  Follow a heart-healthy diet. A nutrition specialist (dietitian) can help educate you about healthy food options and changes.  Follow an exercise program that is approved by your health care provider.  Maintain a healthy weight.  Try to reduce or manage your stress, such as with yoga or meditation. If you need help reducing stress, ask your health care provider.  Do not use any products that contain nicotine or tobacco, such as cigarettes, e-cigarettes, and chewing tobacco. If you need help   quitting, ask your health care provider.  Do not use illegal drugs.  Limit alcohol intake to no more than 1 drink a day for nonpregnant women and 2 drinks a day for men. Be aware of how much alcohol is in your drink. In the [place] one drink  equals one 12 oz bottle of beer (355 mL), one 5 oz glass of wine (148 mL), or one 1 oz glass of hard liquor (44 mL). General instructions  Take over-the-counter and prescription medicines only as told by your health care provider.  Keep all follow-up visits as told by your health care provider. This is important. How is this prevented? In some cases, bradycardia may be prevented by:  Treating underlying medical problems.  Stopping behaviors or medicines that can trigger the condition. Contact a health care provider if you:  Feel light-headed or dizzy.  Almost faint.  Feel weak or are easily fatigued during physical activity.  Experience confusion or have memory problems. Get help right away if:  You faint.  You have: ? An irregular heartbeat (palpitations). ? Chest pain. ? Trouble breathing. Summary  Bradycardia is a slower-than-normal heartbeat. With bradycardia, the resting heart rate is less than 60 beats per minute.  Treatment for this condition depends on the cause.  Manage any health conditions that contribute to bradycardia as told by your health care provider.  Do not use any products that contain nicotine or tobacco, such as cigarettes, e-cigarettes, and chewing tobacco, and limit alcohol intake.  Keep all follow-up visits as told by your health care provider. This is important. This information is not intended to replace advice given to you by your health care provider. Make sure you discuss any questions you have with your health care provider. Document Revised: 02/15/2018 Document Reviewed: 01/13/2018 Elsevier Patient Education  2020 [employer]  

## 2019-11-29 NOTE — Progress Notes (Signed)
3cc initially removed from TR band. Some oozing at site. 3cc replaced and bleeding has stopped. Will leave TR band for 30 additional minutes

## 2019-11-29 NOTE — Progress Notes (Addendum)
Progress Note  Patient Name: Willie Howe Date of Encounter: 11/29/2019  Primary Cardiologist: Rozann Lesches, MD   Subjective   Feeling well. No chest pain, sob or palpitations.   Inpatient Medications    Scheduled Meds: . amLODipine  5 mg Oral Daily  . aspirin EC  81 mg Oral Daily  . losartan  100 mg Oral Q1500  . sodium chloride flush  3 mL Intravenous Q12H  . tamsulosin  0.4 mg Oral QHS  . tiZANidine  4 mg Oral TID   Continuous Infusions: . sodium chloride    . sodium chloride 1 mL/kg/hr (11/29/19 0907)   PRN Meds: sodium chloride, hydrALAZINE, labetalol, morphine injection, sodium chloride flush   Vital Signs    Vitals:   11/29/19 0815 11/29/19 0820 11/29/19 0825 11/29/19 0846  BP: (!) 141/83 (!) 161/84  (!) 179/90  Pulse: 76 77 76 74  Resp: 18 16 (!) 43 16  Temp:    98 F (36.7 C)  TempSrc:      SpO2: 99% 100% 96% 97%  Weight:      Height:        Intake/Output Summary (Last 24 hours) at 11/29/2019 1008 Last data filed at 11/29/2019 0848 Gross per 24 hour  Intake 1733.51 ml  Output 750 ml  Net 983.51 ml   Last 3 Weights 11/29/2019 11/28/2019 11/28/2019  Weight (lbs) 282 lb 6.4 oz 286 lb 3.2 oz 280 lb 13.9 oz  Weight (kg) 128.096 kg 129.819 kg 127.4 kg      Telemetry    SR at 49s- Personally Reviewed  ECG    None today   Physical Exam   GEN: No acute distress.   Neck: No JVD Cardiac: RRR, no murmurs, rubs, or gallops. Radial cath site with TR band.  Respiratory: Clear to auscultation bilaterally. GI: Soft, nontender, non-distended  MS: No edema; No deformity. Neuro:  Nonfocal  Psych: Normal affect   Labs    High Sensitivity Troponin:   Recent Labs  Lab 11/27/19 2052 11/27/19 2248 11/28/19 0116 11/28/19 0306  TROPONINIHS 5 5 6 5       Chemistry Recent Labs  Lab 11/27/19 2052 11/28/19 0533  NA 137 137  K 3.6 3.7  CL 107 105  CO2 23 25  GLUCOSE 134* 124*  BUN 15 12  CREATININE 0.90 0.70  CALCIUM 8.9 8.8*  PROT 7.6   --   ALBUMIN 4.0  --   AST 24  --   ALT 26  --   ALKPHOS 86  --   BILITOT 0.3  --   GFRNONAA >60 >60  GFRAA >60 >60  ANIONGAP 7 7     Hematology Recent Labs  Lab 11/27/19 2052 11/28/19 0533  WBC 8.4 6.9  RBC 4.30 4.25  HGB 12.9* 12.7*  HCT 40.0 39.4  MCV 93.0 92.7  MCH 30.0 29.9  MCHC 32.3 32.2  RDW 14.7 14.6  PLT 346 345    BNPNo results for input(s): BNP, PROBNP in the last 168 hours.   DDimer No results for input(s): DDIMER in the last 168 hours.   Radiology    CT Angio Chest PE W and/or Wo Contrast  Result Date: 11/28/2019 CLINICAL DATA:  Shortness of breath and chest pain x2 days. EXAM: CT ANGIOGRAPHY CHEST WITH CONTRAST TECHNIQUE: Multidetector CT imaging of the chest was performed using the standard protocol during bolus administration of intravenous contrast. Multiplanar CT image reconstructions and MIPs were obtained to evaluate the vascular anatomy. CONTRAST:  111mL OMNIPAQUE IOHEXOL 350 MG/ML SOLN COMPARISON:  March 05, 2014 FINDINGS: Cardiovascular: Satisfactory opacification of the pulmonary arteries to the segmental level. No evidence of pulmonary embolism. Normal heart size. No pericardial effusion. Mediastinum/Nodes: There is mild right hilar lymphadenopathy. A cluster of subcentimeter right hilar calcified lymph nodes is also seen. Lungs/Pleura: Very mild atelectasis is seen within the bilateral lower lobes. A 6 mm calcified lung nodule is seen within the posterior aspect of the right lung base. There is no evidence of a pleural effusion or pneumothorax. Upper Abdomen: No acute abnormality. Musculoskeletal: Multilevel degenerative changes seen throughout the thoracic spine. Review of the MIP images confirms the above findings. IMPRESSION: 1. No evidence of pulmonary embolism. 2. Very mild bilateral lower lobe atelectasis. Electronically Signed   By: Virgina Norfolk M.D.   On: 11/28/2019 00:46   CARDIAC CATHETERIZATION  Result Date: [id number]  LV end  diastolic pressure is normal.  1. Normal coronary anatomy 2. Normal LV EDP Plan: anticipate DC later today.   DG Chest Portable 1 View  Result Date: 11/27/2019 CLINICAL DATA:  Chest pain EXAM: PORTABLE CHEST 1 VIEW COMPARISON:  03/10/2014 FINDINGS: Mild lingular opacity. Right lung is essentially clear. No pleural effusion or pneumothorax. The heart is mildly enlarged. IMPRESSION: Mild lingular opacity, atelectasis versus pneumonia. Electronically Signed   By: Julian Hy M.D.   On: 11/27/2019 21:54   ECHOCARDIOGRAM COMPLETE  Result Date: 11/28/2019    ECHOCARDIOGRAM REPORT   Patient Name:   Willie Howe Date of Exam: 11/28/2019 Medical Rec #:  [id number]       Height:       75.0 in Accession #:    [id number]      Weight:       280.9 lb Date of Birth:  [birth year]        BSA:          2.535 m Patient Age:    59 years        BP:           127/82 mmHg Patient Gender: M               HR:           78 bpm. Exam Location:  [name] Procedure: 2D Echo Indications:    Abnormal ECG 794.31 / R94.31  History:        Patient has prior history of Echocardiogram examinations, most                 recent 03/05/2014. Signs/Symptoms:Chest Pain; Risk Factors:Former                 Smoker and Diabetes. Symptomatic bradycardia, History of Graves'                 disease                 History of cocaine abuse ([name])                 History of heroin abuse , History of pulmonary embolism.  Sonographer:    Leavy Cella RDCS (AE) Referring Phys: [name]  1. Images are limited.  2. Left ventricular ejection fraction, by estimation, is 55%. The left ventricle has normal function. Left ventricular endocardial border not optimally defined to evaluate regional wall motion. There is mild left ventricular hypertrophy. Left ventricular diastolic parameters are indeterminate.  3. Right ventricular systolic function is low normal. The right ventricular  size is normal. Tricuspid regurgitation signal is  inadequate for assessing PA pressure.  4. The mitral valve is grossly normal. No evidence of mitral valve regurgitation.  5. The aortic valve is tricuspid. Aortic valve regurgitation is not visualized.  6. The inferior vena cava is normal in size with greater than 50% respiratory variability, suggesting right atrial pressure of 3 mmHg. FINDINGS  Left Ventricle: Left ventricular ejection fraction, by estimation, is 55%. The left ventricle has normal function. Left ventricular endocardial border not optimally defined to evaluate regional wall motion. The left ventricular internal cavity size was normal in size. There is mild left ventricular hypertrophy. Left ventricular diastolic parameters are indeterminate. Right Ventricle: The right ventricular size is normal. No increase in right ventricular wall thickness. Right ventricular systolic function is low normal. Tricuspid regurgitation signal is inadequate for assessing PA pressure. Left Atrium: Left atrial size was normal in size. Right Atrium: Right atrial size was normal in size. Pericardium: There is no evidence of pericardial effusion. Mitral Valve: The mitral valve is grossly normal. No evidence of mitral valve regurgitation. Tricuspid Valve: The tricuspid valve is grossly normal. Tricuspid valve regurgitation is trivial. Aortic Valve: The aortic valve is tricuspid. Aortic valve regurgitation is not visualized. Mild aortic valve annular calcification. Pulmonic Valve: The pulmonic valve was not well visualized. Pulmonic valve regurgitation is not visualized. Aorta: The aortic root is normal in size and structure. Venous: The inferior vena cava is normal in size with greater than 50% respiratory variability, suggesting right atrial pressure of 3 mmHg. IAS/Shunts: No atrial level shunt detected by color flow Doppler.  LEFT VENTRICLE PLAX 2D LVIDd:         4.49 cm  Diastology LVIDs:         2.47 cm  LV e' lateral:   7.42 cm/s LV PW:         1.20 cm  LV E/e' lateral:  8.4 LV IVS:        1.25 cm  LV e' medial:    6.67 cm/s LVOT diam:     2.30 cm  LV E/e' medial:  9.4 LVOT Area:     4.15 cm  RIGHT VENTRICLE RV S prime:     12.10 cm/s LEFT ATRIUM             Index LA diam:        2.30 cm 0.91 cm/m LA Vol (A2C):   38.0 ml 14.99 ml/m LA Vol (A4C):   74.4 ml 29.35 ml/m LA Biplane Vol: 57.4 ml 22.64 ml/m   AORTA Ao Root diam: 3.30 cm MITRAL VALVE MV Area (PHT): 2.93 cm    SHUNTS MV Decel Time: 259 msec    Systemic Diam: 2.30 cm MV E velocity: 62.60 cm/s MV A velocity: 60.80 cm/s MV E/A ratio:  1.03 Rozann Lesches MD Electronically signed by Rozann Lesches MD Signature Date/Time: 11/28/2019/12:00:13 PM    Final     Cardiac Studies   LEFT HEART CATH AND CORONARY ANGIOGRAPHY  11/29/19  Conclusion    LV end diastolic pressure is normal.   1. Normal coronary anatomy 2. Normal LV EDP  Plan: anticipate DC later today.     Echo 11/28/19 1. Images are limited.  2. Left ventricular ejection fraction, by estimation, is 55%. The left  ventricle has normal function. Left ventricular endocardial border not  optimally defined to evaluate regional wall motion. There is mild left  ventricular hypertrophy. Left  ventricular diastolic parameters are indeterminate.  3. Right ventricular  systolic function is low normal. The right  ventricular size is normal. Tricuspid regurgitation signal is inadequate  for assessing PA pressure.  4. The mitral valve is grossly normal. No evidence of mitral valve  regurgitation.  5. The aortic valve is tricuspid. Aortic valve regurgitation is not  visualized.  6. The inferior vena cava is normal in size with greater than 50%  respiratory variability, suggesting right atrial pressure of 3 mmHg.   Patient Profile     Willie Howe is a 59 y.o. male with a hx of DVT, DM, OSA  who is seen by Dr. Domenic Polite at any point hospital for chest pain, dizziness, bradycardia  and recommended cardiac cath for symptoms concerning for  unstable angina.  Assessment & Plan    1.  Chest pain Troponin negative.  He had substernal chest tightness.  Cardiac cath with normal coronaries.  Normal LV function by echocardiogram.  No further cardiac work-up.  2.  Dizziness/bradycardia -Per note he was noted bradycardic with chronic right bundle blanch block at any pain.  -TSH normal -Echo with preserved LV function and no significant valvular abnormality. -No bradycardia event overnight here.  Rate sustains in 60s. -Avoid AV blocking agent. -Suspect his symptoms likely due to untreated sleep apnea -Monitor if recurrent symptoms as outpatient  3.  Recheck sleep apnea -Has not followed with his sleep doctor for many years -He will need repeat sleep study  4.  Hypertension -Blood pressure elevated -Due to untreated sleep apnea -On home losartan 50 mg daily -Added amlodipine 5 mg daily  Patient will need close follow-up with his PCP for management of his high blood pressure and sleep apnea.  Will arrange cardiology follow-up.  He can be discharged later today after TR band removal without any complication.  For questions or updates, please contact [place] Please consult [email or web address] for contact info under        Signed, Leanor Kail, PA  11/29/2019, 10:08 AM    Agree with note by Robbie Lis PA-C  [name] was transferred from [facility] where he was seen by Dr. Domenic Polite in consultation for atypical chest pain.  A chest CT was negative for pulmonary embolism.  Underwent diagnostic coronary angiography this morning by Dr. Martinique via the right radial approach revealing entirely normal coronary arteries.  He has normal LV function.  Believe his chest pain is noncardiac.  He is 2 weeks post rotator cuff repair.  Stable for discharge home today.  We will arrange follow-up with Dr. Domenic Polite.  Lorretta Harp, M.D., [place], [facility], [place] [address] [place], [place]  [address]  [phone] 11/29/2019 11:46 AM

## 2019-11-29 NOTE — Interval H&P Note (Signed)
History and Physical Interval Note:  11/29/2019 7:24 AM  Willie Howe  has presented today for surgery, with the diagnosis of chest pain.  The various methods of treatment have been discussed with the patient and family. After consideration of risks, benefits and other options for treatment, the patient has consented to  Procedure(s): LEFT HEART CATH AND CORONARY ANGIOGRAPHY (N/A) as a surgical intervention.  The patient's history has been reviewed, patient examined, no change in status, stable for surgery.  I have reviewed the patient's chart and labs.  Questions were answered to the patient's satisfaction.   Cath Lab Visit (complete for each Cath Lab visit)  Clinical Evaluation Leading to the Procedure:   ACS: Yes.    Non-ACS:    Anginal Classification: CCS III  Anti-ischemic medical therapy: No Therapy  Non-Invasive Test Results: No non-invasive testing performed  Prior CABG: No previous CABG        [name] 11/29/2019 7:24 AM

## 2019-12-01 ENCOUNTER — Other Ambulatory Visit: Payer: Self-pay

## 2019-12-01 ENCOUNTER — Encounter: Payer: Self-pay | Admitting: Orthopedic Surgery

## 2019-12-01 DIAGNOSIS — Z4889 Encounter for other specified surgical aftercare: Secondary | ICD-10-CM

## 2019-12-02 ENCOUNTER — Other Ambulatory Visit: Payer: Self-pay | Admitting: Orthopedic Surgery

## 2019-12-02 DIAGNOSIS — G8918 Other acute postprocedural pain: Secondary | ICD-10-CM

## 2019-12-02 MED ORDER — OXYCODONE-ACETAMINOPHEN 5-325 MG PO TABS: 1.0000 | ORAL_TABLET | ORAL | 0 refills | Status: AC | PRN

## 2019-12-02 NOTE — Progress Notes (Signed)
Meds ordered this encounter  Medications  . oxyCODONE-acetaminophen (PERCOCET/ROXICET) 5-325 MG tablet    Sig: Take 1 tablet by mouth every 4 (four) hours as needed for up to 7 days for severe pain.    Dispense:  42 tablet    Refill:  0    Med dose reduction

## 2019-12-12 ENCOUNTER — Other Ambulatory Visit: Payer: Self-pay

## 2019-12-12 DIAGNOSIS — Z4889 Encounter for other specified surgical aftercare: Secondary | ICD-10-CM

## 2019-12-13 ENCOUNTER — Other Ambulatory Visit: Payer: Self-pay | Admitting: Orthopedic Surgery

## 2019-12-13 DIAGNOSIS — Z9889 Other specified postprocedural states: Secondary | ICD-10-CM

## 2019-12-13 DIAGNOSIS — G8918 Other acute postprocedural pain: Secondary | ICD-10-CM

## 2019-12-13 MED ORDER — OXYCODONE-ACETAMINOPHEN 5-325 MG PO TABS: 1.0000 | ORAL_TABLET | ORAL | 0 refills | Status: AC | PRN

## 2019-12-13 NOTE — Progress Notes (Signed)
Meds ordered this encounter  Medications  . oxyCODONE-acetaminophen (PERCOCET/ROXICET) 5-325 MG tablet    Sig: Take 1 tablet by mouth every 4 (four) hours as needed for up to 7 days for severe pain.    Dispense:  42 tablet    Refill:  0    Dose adjusted down POD 3

## 2019-12-20 ENCOUNTER — Other Ambulatory Visit: Payer: Self-pay

## 2019-12-20 ENCOUNTER — Encounter: Payer: Self-pay | Admitting: Pulmonary Disease

## 2019-12-20 ENCOUNTER — Ambulatory Visit (INDEPENDENT_AMBULATORY_CARE_PROVIDER_SITE_OTHER): Payer: Medicare HMO | Admitting: Pulmonary Disease

## 2019-12-20 VITALS — BP 124/74 | HR 84 | Temp 97.1°F | Ht 75.5 in | Wt 280.2 lb

## 2019-12-20 DIAGNOSIS — G4733 Obstructive sleep apnea (adult) (pediatric): Secondary | ICD-10-CM | POA: Diagnosis not present

## 2019-12-20 NOTE — Progress Notes (Addendum)
Willie Howe    [id number]    [birth year]  Primary Care Physician:Eksir, Earnest Conroy, MD  Referring Physician: Nickola Major, MD [address] [place],  [place] [address]  Chief complaint:   Patient with a history of obstructive sleep apnea  HPI:  Diagnosed with obstructive sleep apnea in 2017 Has not been very compliant with CPAP use Difficulty with mask, difficulty with pressure Just not sleeping well with the mask  Has had some issues with chest pain chest discomfort, chest pressure Recently had a cardiac cath that was negative  His symptoms were felt to be related to his obstructive sleep apnea that is inadequately treated  It was diagnosed in 2017, titrated to a pressure of 14 As pressure changes in the past  His appetite is about the same Is about 20 pounds higher than when he had a study done  He was followed by [facility]  He does have symptoms of daytime sleepiness, fatigue, dryness  History of hypertension, cardiac arrhythmia, diabetes, blood clots in the past   Outpatient Encounter Medications as of 12/20/2019  Medication Sig  . amLODipine (NORVASC) 5 MG tablet Take 1 tablet (5 mg total) by mouth daily.  Marland Kitchen aspirin EC 81 MG tablet Take 81 mg by mouth daily.  . cetirizine (ZYRTEC) 10 MG tablet Take 10 mg by mouth daily.  Marland Kitchen docusate sodium (COLACE) 100 MG capsule Take 100 mg by mouth every other day. At night  . folic acid (FOLVITE) 1 MG tablet Take 1 mg by mouth daily.  Marland Kitchen ibuprofen (ADVIL) 800 MG tablet Take 1 tablet (800 mg total) by mouth every 8 (eight) hours as needed for moderate pain.  Marland Kitchen inFLIXimab-abda (RENFLEXIS IV) Inject 1 Dose into the vein every 6 (six) weeks.  Marland Kitchen losartan (COZAAR) 100 MG tablet Take 1 tablet (100 mg total) by mouth daily in the afternoon.  . metFORMIN (GLUCOPHAGE-XR) 500 MG 24 hr tablet Take 1 tablet (500 mg total) by mouth daily with breakfast.  . methotrexate 50 MG/2ML injection Inject 200 mg into the  muscle every Friday.   Marland Kitchen oxyCODONE-acetaminophen (PERCOCET) 7.5-325 MG tablet Take 1 tablet by mouth every 4 (four) hours as needed for severe pain.  Marland Kitchen oxyCODONE-acetaminophen (PERCOCET/ROXICET) 5-325 MG tablet Take 1 tablet by mouth every 4 (four) hours as needed for up to 7 days for severe pain.  . predniSONE (DELTASONE) 5 MG tablet Take 5 mg by mouth daily.  . promethazine (PHENERGAN) 12.5 MG tablet Take 1 tablet (12.5 mg total) by mouth every 6 (six) hours as needed for nausea or vomiting.  . tamsulosin (FLOMAX) 0.4 MG CAPS capsule Take 0.4 mg by mouth at bedtime.   Marland Kitchen tiZANidine (ZANAFLEX) 4 MG tablet TAKE 1 TABLET (4 MG TOTAL) BY MOUTH 3 (THREE) TIMES DAILY.   No facility-administered encounter medications on file as of 12/20/2019.    Allergies as of 12/20/2019 - Review Complete 12/20/2019  Allergen Reaction Noted  . Statins Other (See Comments) 06/05/2019  . Lisinopril Cough 12/15/2017    Past Medical History:  Diagnosis Date  . BPPV (benign paroxysmal positional vertigo)   . DVT (deep venous thrombosis) ([place])   . Graves disease 1996  . History of kidney stones   . History of pulmonary embolism   . Obesity   . Rheumatoid arthritis ([place])   . Sleep apnea    Intolerant of CPAP  . Type 2 diabetes mellitus ([place])  Past Surgical History:  Procedure Laterality Date  . BACK SURGERY  2015  . COLONOSCOPY    . EXAM UNDER ANESTHESIA WITH MANIPULATION OF KNEE Right 04/18/2015   Procedure: MANIPULATION OF RIGHT KNEE UNDER ANESTHESIA;  Surgeon: Carole Civil, MD;  Location: [facility];  Service: Orthopedics;  Laterality: Right;  . IVC Filter     removed December 2016  . [place]  2010  . KNEE ARTHROSCOPY Right 2006   meniscus   . KNEE ARTHROSCOPY WITH MEDIAL MENISECTOMY Left 04/24/2016   Procedure: LEFT KNEE ARTHROSCOPY WITH PARTIAL MEDIAL MENISECTOMY;  Surgeon: Carole Civil, MD;  Location: [facility];  Service: Orthopedics;  Laterality: Left;  . KNEE SURGERY     x 2    . KNEE SURGERY Left 1976   ?ligament repair   . LEFT HEART CATH AND CORONARY ANGIOGRAPHY N/A 11/29/2019   Procedure: LEFT HEART CATH AND CORONARY ANGIOGRAPHY;  Surgeon: Martinique, Peter M, MD;  Location: [facility];  Service: Cardiovascular;  Laterality: N/A;  . LUMBAR WOUND DEBRIDEMENT N/A 02/23/2014   Procedure: LUMBAR WOUND DEBRIDEMENT;  Surgeon: Faythe Ghee, MD;  Location: [facility];  Service: Neurosurgery;  Laterality: N/A;  exploration lumbar wound. repair of dural defect.  Marland Kitchen MENISECTOMY Right    open medial   . NEPHROLITHOTOMY    . PERIPHERAL VASCULAR CATHETERIZATION N/A 01/30/2015   Procedure: IVC Filter Insertion;  Surgeon: Serafina Mitchell, MD;  Location: [facility];  Service: Cardiovascular;  Laterality: N/A;  . PERIPHERAL VASCULAR CATHETERIZATION N/A 04/24/2015   Procedure: IVC Filter Removal;  Surgeon: Serafina Mitchell, MD;  Location: [facility];  Service: Cardiovascular;  Laterality: N/A;  . PERIPHERAL VASCULAR CATHETERIZATION N/A 08/14/2015   Procedure: IVC Filter Removal;  Surgeon: Serafina Mitchell, MD;  Location: [facility];  Service: Cardiovascular;  Laterality: N/A;  . SHOULDER ARTHROSCOPY WITH OPEN ROTATOR CUFF REPAIR Left 11/10/2019   Procedure: SHOULDER ARTHROSCOPY WITH OPEN ROTATOR CUFF REPAIR;  Surgeon: Carole Civil, MD;  Location: [facility];  Service: Orthopedics;  Laterality: Left;  pt tested + on 2/23, does not need Covid test < 90 days  . SHOULDER ARTHROSCOPY WITH ROTATOR CUFF REPAIR AND OPEN BICEPS TENODESIS Left 02/15/2019   Procedure: SHOULDER ARTHROSCOPY WITH open ROTATOR CUFF REPAIR AND OPEN BICEPS TENODESIS;  Surgeon: Carole Civil, MD;  Location[facility];  Service: Orthopedics;  Laterality: Left;  . SPINE SURGERY  2015   Dr Hal Neer Fusion   . SPINE SURGERY  1999   discectomy  . TOTAL KNEE ARTHROPLASTY Right 02/02/2015   Procedure:  RIGHT TOTAL KNEE ARTHROPLASTY;  Surgeon: Carole Civil, MD;  Location: [facility];  Service:  Orthopedics;  Laterality: Right;  LM with pt's daughter of new arrival time (10:45)    . TOTAL KNEE ARTHROPLASTY Left 07/24/2016   Procedure: TOTAL KNEE ARTHROPLASTY;  Surgeon: Carole Civil, MD;  Location: [facility];  Service: Orthopedics;  Laterality: Left;    Family History  Problem Relation Age of Onset  . Deep vein thrombosis Mother   . Hypertension Mother   . Breast cancer Mother   . Hypertension Father   . Prostate cancer Father   . CAD Sister     Social History   Socioeconomic History  . Marital status: Married    Spouse name: Not on file  . Number of children: 4  . Years of education: College  . Highest education level: Not on file  Occupational  History  . Occupation: N/A  Tobacco Use  . Smoking status: Former Smoker    Packs/day: 1.50    Years: 20.00    Pack years: 30.00    Types: Cigarettes    Quit date: 02/03/1995    Years since quitting: 24.8  . Smokeless tobacco: Never Used  Substance and Sexual Activity  . Alcohol use: No    Alcohol/week: 0.0 standard drinks  . Drug use: No  . Sexual activity: Yes  Other Topics Concern  . Not on file  Social History Narrative   Drinks about 1 cup of coffee a day    Social Determinants of Health   Financial Resource Strain:   . Difficulty of Paying Living Expenses:   Food Insecurity:   . Worried About Charity fundraiser in the Last Year:   . Arboriculturist in the Last Year:   Transportation Needs:   . Film/video editor (Medical):   Marland Kitchen Lack of Transportation (Non-Medical):   Physical Activity:   . Days of Exercise per Week:   . Minutes of Exercise per Session:   Stress:   . Feeling of Stress :   Social Connections:   . Frequency of Communication with Friends and Family:   . Frequency of Social Gatherings with Friends and Family:   . Attends Religious Services:   . Active Member of Clubs or Organizations:   . Attends Archivist Meetings:   Marland Kitchen Marital Status:   Intimate Partner Violence:   .  Fear of Current or Ex-Partner:   . Emotionally Abused:   Marland Kitchen Physically Abused:   . Sexually Abused:     Review of Systems  Constitutional: Positive for fatigue.  Respiratory: Positive for apnea.   Psychiatric/Behavioral: Positive for sleep disturbance.    Vitals:   12/20/19 1619  BP: 124/74  Pulse: 84  Temp: (!) 97.1 F (36.2 C)  SpO2: 97%     Physical Exam  Constitutional: He appears well-developed.  Obese  HENT:  Head: Normocephalic and atraumatic.  Mallampati 4, crowded oropharynx  Eyes: Pupils are equal, round, and reactive to light. Conjunctivae are normal. Right eye exhibits no discharge.  Neck: No tracheal deviation present. No thyromegaly present.  Cardiovascular: Normal rate and regular rhythm.  Pulmonary/Chest: Effort normal and breath sounds normal. No respiratory distress. He has no wheezes. He has no rales. He exhibits no tenderness.  Musculoskeletal:        General: No edema. Normal range of motion.     Cervical back: Normal range of motion and neck supple.  Neurological: He is alert. No cranial nerve deficit.   Results of the [facility] flowsheet 12/20/2019  Sitting and reading 1  Watching TV 1  Sitting, inactive in a public place (e.g. a theatre or a meeting) 0  As a passenger in a car for an hour without a break 0  Lying down to rest in the afternoon when circumstances permit 1  Sitting and talking to someone 0  Sitting quietly after a lunch without alcohol 1  In a car, while stopped for a few minutes in traffic 0  Total score 4    Data Reviewed: Sleep study reviewed Titrated to CPAP 14 with good good control of apneic events  Assessment:  Obstructive sleep apnea -Suboptimal compliance  Mask and pressure intolerance  Plan/Recommendations: Patient was encouraged to contact [facility] to see if they can help him with his current machine  His machine is paid for and it  still works well  Currently on a pressure of 14 We will try and  decrease the pressure to 12, mask of patient's choice  If they are unable to help with the machine then he may need a new home sleep study and an auto titrating device  Encouraged to continue working on weight loss efforts  We will see him back in the office in about 6 weeks  Encouraged to call with any significant concerns   Sherrilyn Rist MD  Pulmonary and Critical Care 12/20/2019, 4:33 PM  CC: Nickola Major, MD

## 2019-12-20 NOTE — Progress Notes (Signed)
Cardiology Office Note    Date:  12/26/2019   ID:  Willie Howe, DOB July 16, 1961, MRN [id number]  PCP:  Nickola Major, MD  Cardiologist: Rozann Lesches, MD EPS: None  Chief Complaint  Patient presents with  . Hospitalization Follow-up    History of Present Illness:  Willie Howe is a 59 y.o. male with medical history significant of DVT, diabetes mellitus, sleep apnea, thyroid disease and obesity   Patient presented to ED for evaluation of chest pressure with dyspnea and dizziness. troponin were negative and the first EKG showed normal sinus rhythm with bradycardia and right bundle branch block but no acute ischemic changes.  CT chest  negative for pulmonary embolism and chest x-ray showed questionable atelectasis versus pneumonia. Cardiac cath 11/29/19-normal coronaries and normal LVEF on echo. dizzness and bradycardia with RBBB-TSH normal, no AV nodal blocking agents and needs sleep study. BP elevated and amlodipine added.  Patient comes in for f/u. Stopped losartan and amlodipine 3 days ago because of significant dizziness. Weak this am. BP running 124/60. Patient has been back on CPAP for a week. Watching his salt closely. Drinks 2- 7 ounce mountain dews and 16 ounce coffee daily   Past Medical History:  Diagnosis Date  . BPPV (benign paroxysmal positional vertigo)   . DVT (deep venous thrombosis) ([employer])   . Graves disease 1996  . History of kidney stones   . History of pulmonary embolism   . Obesity   . Rheumatoid arthritis ([place])   . Sleep apnea    Intolerant of CPAP  . Type 2 diabetes mellitus ([place])     Past Surgical History:  Procedure Laterality Date  . BACK SURGERY  2015  . COLONOSCOPY    . EXAM UNDER ANESTHESIA WITH MANIPULATION OF KNEE Right 04/18/2015   Procedure: MANIPULATION OF RIGHT KNEE UNDER ANESTHESIA;  Surgeon: Carole Civil, MD;  Location: [facility];  Service: Orthopedics;  Laterality: Right;  . IVC Filter     removed December 2016  . [place]  2010  . KNEE ARTHROSCOPY Right 2006   meniscus   . KNEE ARTHROSCOPY WITH MEDIAL MENISECTOMY Left 04/24/2016   Procedure: LEFT KNEE ARTHROSCOPY WITH PARTIAL MEDIAL MENISECTOMY;  Surgeon: Carole Civil, MD;  Location: [facility];  Service: Orthopedics;  Laterality: Left;  . KNEE SURGERY     x 2  . KNEE SURGERY Left 1976   ?ligament repair   . LEFT HEART CATH AND CORONARY ANGIOGRAPHY N/A 11/29/2019   Procedure: LEFT HEART CATH AND CORONARY ANGIOGRAPHY;  Surgeon: Martinique, Peter M, MD;  Location: [facility];  Service: Cardiovascular;  Laterality: N/A;  . LUMBAR WOUND DEBRIDEMENT N/A 02/23/2014   Procedure: LUMBAR WOUND DEBRIDEMENT;  Surgeon: Faythe Ghee, MD;  Location: [facility];  Service: Neurosurgery;  Laterality: N/A;  exploration lumbar wound. repair of dural defect.  Marland Kitchen MENISECTOMY Right    open medial   . NEPHROLITHOTOMY    . PERIPHERAL VASCULAR CATHETERIZATION N/A 01/30/2015   Procedure: IVC Filter Insertion;  Surgeon: Serafina Mitchell, MD;  Location: [facility];  Service: Cardiovascular;  Laterality: N/A;  . PERIPHERAL VASCULAR CATHETERIZATION N/A 04/24/2015   Procedure: IVC Filter Removal;  Surgeon: Serafina Mitchell, MD;  Location: [place] [facility];  Service: Cardiovascular;  Laterality: N/A;  . PERIPHERAL VASCULAR CATHETERIZATION N/A 08/14/2015   Procedure: IVC Filter Removal;  Surgeon: Serafina Mitchell, MD;  Location: [place] [facility];  Service: Cardiovascular;  Laterality: N/A;  . SHOULDER ARTHROSCOPY WITH OPEN ROTATOR CUFF REPAIR Left 11/10/2019   Procedure: SHOULDER ARTHROSCOPY WITH OPEN ROTATOR CUFF REPAIR;  Surgeon: Carole Civil, MD;  Location: [facility];  Service: Orthopedics;  Laterality: Left;  pt tested + on 2/23, does not need Covid test < 90 days  . SHOULDER ARTHROSCOPY WITH ROTATOR CUFF REPAIR AND OPEN BICEPS TENODESIS Left 02/15/2019   Procedure: SHOULDER ARTHROSCOPY WITH open ROTATOR CUFF REPAIR AND OPEN BICEPS TENODESIS;  Surgeon: Carole Civil, MD;  Location: [facility];  Service: Orthopedics;  Laterality: Left;  . SPINE SURGERY  2015   Dr Hal Neer Fusion   . SPINE SURGERY  1999   discectomy  . TOTAL KNEE ARTHROPLASTY Right 02/02/2015   Procedure:  RIGHT TOTAL KNEE ARTHROPLASTY;  Surgeon: Carole Civil, MD;  Location: [facility];  Service: Orthopedics;  Laterality: Right;  LM with pt's daughter of new arrival time (10:45)    . TOTAL KNEE ARTHROPLASTY Left 07/24/2016   Procedure: TOTAL KNEE ARTHROPLASTY;  Surgeon: Carole Civil, MD;  Location: [facility];  Service: Orthopedics;  Laterality: Left;    Current Medications: Current Meds  Medication Sig  . aspirin EC 81 MG tablet Take 81 mg by mouth daily.  . cetirizine (ZYRTEC) 10 MG tablet Take 10 mg by mouth daily.  Marland Kitchen docusate sodium (COLACE) 100 MG capsule Take 100 mg by mouth every other day. At night  . folic acid (FOLVITE) 1 MG tablet Take 1 mg by mouth daily.  Marland Kitchen ibuprofen (ADVIL) 800 MG tablet Take 1 tablet (800 mg total) by mouth every 8 (eight) hours as needed for moderate pain.  Marland Kitchen inFLIXimab-abda (RENFLEXIS IV) Inject 1 Dose into the vein every 6 (six) weeks.  Marland Kitchen losartan (COZAAR) 100 MG tablet Take 0.5 tablets (50 mg total) by mouth daily in the afternoon.  . methotrexate 50 MG/2ML injection Inject 200 mg into the muscle every Friday.   Marland Kitchen oxyCODONE-acetaminophen (PERCOCET/ROXICET) 5-325 MG tablet Take 1 tablet by mouth every 4 (four) hours as needed for up to 7 days for severe pain.  . predniSONE (DELTASONE) 5 MG tablet Take 5 mg by mouth daily.  . promethazine (PHENERGAN) 12.5 MG tablet Take 1 tablet (12.5 mg total) by mouth every 6 (six) hours as needed for nausea or vomiting.  . tamsulosin (FLOMAX) 0.4 MG CAPS capsule Take 0.4 mg by mouth at bedtime.   Marland Kitchen tiZANidine (ZANAFLEX) 4 MG tablet TAKE 1 TABLET (4 MG TOTAL) BY MOUTH 3 (THREE) TIMES DAILY.  . [DISCONTINUED] amLODipine (NORVASC) 5 MG tablet Take 1 tablet (5 mg total) by mouth daily.  . [DISCONTINUED] losartan  (COZAAR) 100 MG tablet Take 1 tablet (100 mg total) by mouth daily in the afternoon.     Allergies:   Statins and Lisinopril   Social History   Socioeconomic History  . Marital status: Married    Spouse name: Not on file  . Number of children: 4  . Years of education: College  . Highest education level: Not on file  Occupational History  . Occupation: N/A  Tobacco Use  . Smoking status: Former Smoker    Packs/day: 1.50    Years: 20.00    Pack years: 30.00    Types: Cigarettes    Quit date: 02/03/1995    Years since quitting: 24.9  . Smokeless tobacco: Never Used  Substance and Sexual Activity  . Alcohol use: No    Alcohol/week: 0.0 standard drinks  . Drug use: No  .  Sexual activity: Yes  Other Topics Concern  . Not on file  Social History Narrative   Drinks about 1 cup of coffee a day    Social Determinants of Health   Financial Resource Strain:   . Difficulty of Paying Living Expenses:   Food Insecurity:   . Worried About Charity fundraiser in the Last Year:   . Arboriculturist in the Last Year:   Transportation Needs:   . Film/video editor (Medical):   Marland Kitchen Lack of Transportation (Non-Medical):   Physical Activity:   . Days of Exercise per Week:   . Minutes of Exercise per Session:   Stress:   . Feeling of Stress :   Social Connections:   . Frequency of Communication with Friends and Family:   . Frequency of Social Gatherings with Friends and Family:   . Attends Religious Services:   . Active Member of Clubs or Organizations:   . Attends Archivist Meetings:   Marland Kitchen Marital Status:      Family History:  The patient's   family history includes Breast cancer in his mother; CAD in his sister; Deep vein thrombosis in his mother; Hypertension in his father and mother; Prostate cancer in his father.   ROS:   Please see the history of present illness.    ROS All other systems reviewed and are negative.   PHYSICAL EXAM:   VS:  BP (!) 148/82   Pulse  65   Ht 6' 3.5" (1.918 m)   Wt 277 lb (125.6 kg)   BMI 34.17 kg/m   Physical Exam  GEN: Obese, in no acute distress  Neck: no JVD, carotid bruits, or masses Cardiac:RRR; no murmurs, rubs, or gallops  Respiratory:  clear to auscultation bilaterally, normal work of breathing GI: soft, nontender, nondistended, + BS Ext: without cyanosis, clubbing, or edema, Good distal pulses bilaterally Neuro:  Alert and Oriented x 3 Psych: euthymic mood, full affect  Wt Readings from Last 3 Encounters:  12/26/19 277 lb (125.6 kg)  12/21/19 280 lb (127 kg)  12/20/19 280 lb 3.2 oz (127.1 kg)      Studies/Labs Reviewed:   EKG:  EKG is not ordered today.   Recent Labs: 11/27/2019: ALT 26 11/28/2019: BUN 12; Creatinine, Ser 0.70; Hemoglobin 12.7; Platelets 345; Potassium 3.7; Sodium 137; TSH 1.616   Lipid Panel    Component Value Date/Time   CHOL  10/10/2009 0420    173        ATP III CLASSIFICATION:  <200     mg/dL   Desirable  200-239  mg/dL   Borderline High  >=240    mg/dL   High          TRIG 155 (H) 10/10/2009 0420   HDL 37 (L) 10/10/2009 0420   CHOLHDL 4.7 10/10/2009 0420   VLDL 31 10/10/2009 0420   LDLCALC (H) 10/10/2009 0420    105        Total Cholesterol/HDL:CHD Risk Coronary Heart Disease Risk Table                     Men   Women  1/2 Average Risk   3.4   3.3  Average Risk       5.0   4.4  2 X Average Risk   9.6   7.1  3 X Average Risk  23.4   11.0        Use the calculated Patient Ratio above  and the CHD Risk Table to determine the patient's CHD Risk.        ATP III CLASSIFICATION (LDL):  <100     mg/dL   Optimal  100-129  mg/dL   Near or Above                    Optimal  130-159  mg/dL   Borderline  160-189  mg/dL   High  >190     mg/dL   Very High    Additional studies/ records that were reviewed today include:  2D echo 11/28/2019  Study Conclusions   - Left ventricle: The cavity size was normal. Systolic function was    normal. The estimated ejection  fraction was in the range of 60%    to 65%. Wall motion was normal; there were no regional wall    motion abnormalities. There was an increased relative    contribution of atrial contraction to ventricular filling.    Doppler parameters are consistent with abnormal left ventricular    relaxation (grade 1 diastolic dysfunction).  - Right ventricle: The cavity size was mildly dilated. Wall    thickness was normal. Systolic function was severely reduced.  - Pulmonary arteries: PA peak pressure: 82 mm Hg (S).   Cath [id number]  LV end diastolic pressure is normal.   1. Normal coronary anatomy 2. Normal LV EDP   Plan: anticipate DC later today.     ASSESSMENT:    1. Precordial pain   2. Sinus bradycardia   3. RBBB   4. Essential hypertension   5. OSA (obstructive sleep apnea)   6. History of DVT (deep vein thrombosis)      PLAN:  In order of problems listed above:  Chest pain cardiac cath normal coronary arteries, normal LV function by echo  Sinus bradycardia with chronic right bundle branch block no AV nodal blocking agents.  Needs sleep study  Essential hypertension amlodipine added for better blood pressure control but extremely dizzy and stopped it and losartan 3 days ago. Has his of BPPV in past.  Sleep apnea now treated and wearing his CPAP. Will stop amlodipine and restart losartan at lower dose 50 mg once daily. F/u in 4 weeks. Reduce caffeine.  Sleep apnea had not been followed by a sleep doctor in many years but now back on CPAP  History of DVT    Medication Adjustments/Labs and Tests Ordered: Current medicines are reviewed at length with the patient today.  Concerns regarding medicines are outlined above.  Medication changes, Labs and Tests ordered today are listed in the Patient Instructions below. Patient Instructions  Medication Instructions:  STOP Amlodipine   DECREASE Losartan to 50 mg daily  *If you need a refill on your cardiac medications before  your next appointment, please call your pharmacy*   Lab Work: None today If you have labs (blood work) drawn today and your tests are completely normal, you will receive your results only by: Marland Kitchen MyChart Message (if you have MyChart) OR . A paper copy in the mail If you have any lab test that is abnormal or we need to change your treatment, we will call you to review the results.   Testing/Procedures: None today   Follow-Up: At [facility], you and your health needs are our priority.  As part of our continuing mission to provide you with exceptional heart care, we have created designated Provider Care Teams.  These Care Teams include your primary Cardiologist (physician) and  Advanced Practice Providers (APPs -  Physician Assistants and Nurse Practitioners) who all work together to provide you with the care you need, when you need it.  We recommend signing up for the patient portal called "MyChart".  Sign up information is provided on this After Visit Summary.  MyChart is used to connect with patients for Virtual Visits (Telemedicine).  Patients are able to view lab/test results, encounter notes, upcoming appointments, etc.  Non-urgent messages can be sent to your provider as well.   To learn more about what you can do with MyChart, go to [email or web address].    Your next appointment:   4-6 week(s)  The format for your next appointment:   In Person  Provider:   Rozann Lesches, MD   Other Instructions Limit caffeine use       Thank you for choosing [facility] !            Signed, Ermalinda Barrios, PA-C  12/26/2019 1:10 PM    [facility] [address], [place], [place]  [address] Phone: [phone]; Fax: [phone]

## 2019-12-20 NOTE — Patient Instructions (Signed)
[facility] let us see what they are able to help you with  We want to decrease the pressure from 14-12  Mask of your choice, what ever fits the best  We will try and get downloads from the machine in a few weeks to see how it is working  You can always call if you feel the pressure is not tolerable and we can adjust that as tolerated   If they cannot help you change the pressure  Plan will be to get a home sleep study and set you up with an auto titrating machine  Call with concerns  Will see you in 6 weeks

## 2019-12-21 ENCOUNTER — Encounter: Payer: Self-pay | Admitting: Orthopedic Surgery

## 2019-12-21 ENCOUNTER — Ambulatory Visit (INDEPENDENT_AMBULATORY_CARE_PROVIDER_SITE_OTHER): Payer: Medicare HMO | Admitting: Orthopedic Surgery

## 2019-12-21 VITALS — BP 126/75 | HR 84 | Temp 97.0°F | Ht 75.5 in | Wt 280.0 lb

## 2019-12-21 DIAGNOSIS — Z9889 Other specified postprocedural states: Secondary | ICD-10-CM

## 2019-12-21 MED ORDER — OXYCODONE-ACETAMINOPHEN 5-325 MG PO TABS: 1.0000 | ORAL_TABLET | ORAL | 0 refills | Status: AC | PRN

## 2019-12-21 NOTE — Patient Instructions (Signed)
Start PT in [place] sling out of the house can remove in the house   dont lift arm

## 2019-12-21 NOTE — Progress Notes (Signed)
Chief Complaint  Patient presents with  . Follow-up    Recheck on left shoulder, DOS 11-10-19.   59 year old male status post revision cuff repair left shoulder 6 weeks postop patient's pain is controlled with oxycodone  He did have to go to the hospital for which shortness of breath he had a catheterization his symptoms thought to be secondary to sleep apnea  He will start his therapy in [place]  He can take his sling off in the house should wear it out of the house it is okay to drive  His incision looks great he has no swelling he has controlled pain as we stated  Recommend 6-week follow-up  We can start a taper on his Percocet and eventually convert him back to hydrocodone  Meds ordered this encounter  Medications  . oxyCODONE-acetaminophen (PERCOCET/ROXICET) 5-325 MG tablet    Sig: Take 1 tablet by mouth every 4 (four) hours as needed for up to 7 days for severe pain.    Dispense:  42 tablet    Refill:  0

## 2019-12-26 ENCOUNTER — Ambulatory Visit: Payer: Medicare HMO | Admitting: Physician Assistant

## 2019-12-26 ENCOUNTER — Encounter: Payer: Self-pay | Admitting: Physician Assistant

## 2019-12-26 ENCOUNTER — Other Ambulatory Visit: Payer: Self-pay

## 2019-12-26 VITALS — BP 148/82 | HR 65 | Ht 75.5 in | Wt 277.0 lb

## 2019-12-26 DIAGNOSIS — Z86718 Personal history of other venous thrombosis and embolism: Secondary | ICD-10-CM

## 2019-12-26 DIAGNOSIS — I10 Essential (primary) hypertension: Secondary | ICD-10-CM

## 2019-12-26 DIAGNOSIS — I4510 Unspecified right bundle-branch block: Secondary | ICD-10-CM | POA: Diagnosis not present

## 2019-12-26 DIAGNOSIS — R072 Precordial pain: Secondary | ICD-10-CM

## 2019-12-26 DIAGNOSIS — R001 Bradycardia, unspecified: Secondary | ICD-10-CM | POA: Diagnosis not present

## 2019-12-26 DIAGNOSIS — G4733 Obstructive sleep apnea (adult) (pediatric): Secondary | ICD-10-CM

## 2019-12-26 MED ORDER — LOSARTAN POTASSIUM 100 MG PO TABS: 50.0000 mg | ORAL_TABLET | Freq: Every day | ORAL | 3 refills | Status: DC

## 2019-12-26 NOTE — Patient Instructions (Signed)
Medication Instructions:  STOP Amlodipine   DECREASE Losartan to 50 mg daily  *If you need a refill on your cardiac medications before your next appointment, please call your pharmacy*   Lab Work: None today If you have labs (blood work) drawn today and your tests are completely normal, you will receive your results only by: Marland Kitchen MyChart Message (if you have MyChart) OR . A paper copy in the mail If you have any lab test that is abnormal or we need to change your treatment, we will call you to review the results.   Testing/Procedures: None today   Follow-Up: At [facility], you and your health needs are our priority.  As part of our continuing mission to provide you with exceptional heart care, we have created designated Provider Care Teams.  These Care Teams include your primary Cardiologist (physician) and Advanced Practice Providers (APPs -  Physician Assistants and Nurse Practitioners) who all work together to provide you with the care you need, when you need it.  We recommend signing up for the patient portal called "MyChart".  Sign up information is provided on this After Visit Summary.  MyChart is used to connect with patients for Virtual Visits (Telemedicine).  Patients are able to view lab/test results, encounter notes, upcoming appointments, etc.  Non-urgent messages can be sent to your provider as well.   To learn more about what you can do with MyChart, go to [email or web address].    Your next appointment:   4-6 week(s)  The format for your next appointment:   In Person  Provider:   Rozann Lesches, MD   Other Instructions Limit caffeine use       Thank you for choosing [facility] !

## 2019-12-27 ENCOUNTER — Ambulatory Visit: Payer: Medicare HMO | Attending: Orthopedic Surgery | Admitting: Physical Therapy

## 2019-12-27 ENCOUNTER — Encounter: Payer: Self-pay | Admitting: Physical Therapy

## 2019-12-27 ENCOUNTER — Other Ambulatory Visit: Payer: Self-pay

## 2019-12-27 DIAGNOSIS — M25512 Pain in left shoulder: Secondary | ICD-10-CM | POA: Insufficient documentation

## 2019-12-27 DIAGNOSIS — M25612 Stiffness of left shoulder, not elsewhere classified: Secondary | ICD-10-CM

## 2019-12-27 DIAGNOSIS — G8929 Other chronic pain: Secondary | ICD-10-CM | POA: Diagnosis present

## 2019-12-27 NOTE — Patient Instructions (Signed)
WAND EXTERNAL ROTATION - SUPINE ER  Lie on your back holding a cane or wand with both hands.   On the affected side, place a small rolled up towel or pillow under your elbow. Maintain approx. 90 degree bend at the elbow with your arm approximately 30-45 degrees away from your side.   Use your other arm to pull the wand/cane to rotate the affected arm back into a stretch. Hold and then return to starting position and then repeat.   Video # [id number]  Repeat 10 Times    Hold 15 Seconds    Complete 1 Set    Perform 4 Times a Day    Closed chain flexion stretch  Step 1: Stand facing table/countertop with both hands on table/countertop.  Step 2: Keep hands on table/countertop and step back away from table/countertop to stretch the shoulders. DO NOT WEIGHT BEAR THROUGH AFFECTED UPPER EXTREMITY  Repeat 10 Times    Hold 10 Seconds    Complete 1 Set    Perform 4 Times a Day

## 2019-12-27 NOTE — Therapy (Addendum)
[facility] [place], [place], [address] Phone: [phone]   Fax:  [phone]  Physical Therapy Evaluation  Patient Details  Name: Willie Howe MRN: [id number] Date of Birth: November 30, 1960 Referring Provider (PT): Arther Abbott MD.   Encounter Date: 12/27/2019  PT End of Session - 12/27/19 0958    Visit Number  1    Number of Visits  16    Date for PT Re-Evaluation  02/21/20    Authorization Type  FOTO AT LEAST EVERY 5TH VISIT.  PROGRESS NOTE AT 10TH VISIT.  KX MODIFIER AFTER 15 VISITS.    PT Start Time  0815    PT Stop Time  0910    PT Time Calculation (min)  55 min    Activity Tolerance  Patient tolerated treatment well    Behavior During Therapy  [facility] for tasks assessed/performed       Past Medical History:  Diagnosis Date  . BPPV (benign paroxysmal positional vertigo)   . DVT (deep venous thrombosis) ([place])   . Graves disease 1996  . History of kidney stones   . History of pulmonary embolism   . Obesity   . Rheumatoid arthritis ([place])   . Sleep apnea    Intolerant of CPAP  . Type 2 diabetes mellitus ([place])     Past Surgical History:  Procedure Laterality Date  . BACK SURGERY  2015  . COLONOSCOPY    . EXAM UNDER ANESTHESIA WITH MANIPULATION OF KNEE Right 04/18/2015   Procedure: MANIPULATION OF RIGHT KNEE UNDER ANESTHESIA;  Surgeon: Carole Civil, MD;  Location: [facility];  Service: Orthopedics;  Laterality: Right;  . IVC Filter     removed December 2016  . [place]  2010  . KNEE ARTHROSCOPY Right 2006   meniscus   . KNEE ARTHROSCOPY WITH MEDIAL MENISECTOMY Left 04/24/2016   Procedure: LEFT KNEE ARTHROSCOPY WITH PARTIAL MEDIAL MENISECTOMY;  Surgeon: Carole Civil, MD;  [facility]: [facility];  Service: Orthopedics;  Laterality: Left;  . KNEE SURGERY     x 2  . KNEE SURGERY Left 1976   ?ligament repair   . LEFT HEART CATH AND CORONARY ANGIOGRAPHY N/A 11/29/2019   Procedure: LEFT HEART CATH AND  CORONARY ANGIOGRAPHY;  Surgeon: Martinique, Peter M, MD;  Location: [place] [facility];  Service: Cardiovascular;  Laterality: N/A;  . LUMBAR WOUND DEBRIDEMENT N/A 02/23/2014   Procedure: LUMBAR WOUND DEBRIDEMENT;  Surgeon: Faythe Ghee, MD;  Location: [place];  Service: Neurosurgery;  Laterality: N/A;  exploration lumbar wound. repair of dural defect.  Marland Kitchen MENISECTOMY Right    open medial   . NEPHROLITHOTOMY    . PERIPHERAL VASCULAR CATHETERIZATION N/A 01/30/2015   Procedure: IVC Filter Insertion;  Surgeon: Serafina Mitchell, MD;  Location: [place] [facility];  Service: Cardiovascular;  Laterality: N/A;  . PERIPHERAL VASCULAR CATHETERIZATION N/A 04/24/2015   Procedure: IVC Filter Removal;  Surgeon: Serafina Mitchell, MD;  Location: [place] [facility];  Service: Cardiovascular;  Laterality: N/A;  . PERIPHERAL VASCULAR CATHETERIZATION N/A 08/14/2015   Procedure: IVC Filter Removal;  Surgeon: Serafina Mitchell, MD;  Location: [place] [facility];  Service: Cardiovascular;  Laterality: N/A;  . SHOULDER ARTHROSCOPY WITH OPEN ROTATOR CUFF REPAIR Left 11/10/2019   Procedure: SHOULDER ARTHROSCOPY WITH OPEN ROTATOR CUFF REPAIR;  Surgeon: Carole Civil, MD;  [facility];  Service: Orthopedics;  Laterality: Left;  pt tested + on 2/23, does not need Covid test <  90 days  . SHOULDER ARTHROSCOPY WITH ROTATOR CUFF REPAIR AND OPEN BICEPS TENODESIS Left 02/15/2019   Procedure: SHOULDER ARTHROSCOPY WITH open ROTATOR CUFF REPAIR AND OPEN BICEPS TENODESIS;  Surgeon: Carole Civil, MD;  [facility];  Service: Orthopedics;  Laterality: Left;  . SPINE SURGERY  2015   Dr Hal Neer Fusion   . SPINE SURGERY  1999   discectomy  . TOTAL KNEE ARTHROPLASTY Right 02/02/2015   Procedure:  RIGHT TOTAL KNEE ARTHROPLASTY;  Surgeon: Carole Civil, MD;  [facility];  Service: Orthopedics;  Laterality: Right;  LM with pt's daughter of new arrival time (10:45)    . TOTAL KNEE ARTHROPLASTY Left 07/24/2016   Procedure:  TOTAL KNEE ARTHROPLASTY;  Surgeon: Carole Civil, MD;  [facility];  Service: Orthopedics;  Laterality: Left;    There were no vitals filed for this visit.   Subjective Assessment - 12/27/19 1249    Subjective  COVID-19 screen performed prior to patient entering clinic.  The patient presents to the clinic today s/p right shoulder open rotator cuff repair surgery performed on 11/10/19.  His pain-level at rest today is a 6/10 and can rise to higher levels with movement out of sling.  Pain medication decreases pain.    Pertinent History  Bilateral TKA's, DM, RA, spinal surgery, previous left shoulder surgery.    Patient Stated Goals  Use left UE without pain.    Currently in Pain?  Yes    Pain Location  Shoulder    Pain Orientation  Left    Pain Descriptors / Indicators  Throbbing    Pain Type  Surgical pain    Pain Onset  More than a month ago    Pain Frequency  Constant    Aggravating Factors   See above.    Pain Relieving Factors  See above.         [facility] PT Assessment - 12/27/19 0001      Assessment   Medical Diagnosis  S/p left RTC repair.    Referring Provider (PT)  Arther Abbott MD.    Onset Date/Surgical Date  --   11/10/19 (surgery date).     Precautions   Precaution Comments  Begin with left shoulder PROM.    Required Braces or Orthoses  --   Left shoulder sling.     Restrictions   Weight Bearing Restrictions  --   No left UE weight bearing.     Balance Screen   Has the patient fallen in the past 6 months  Yes    How many times?  --   1.   Has the patient had a decrease in activity level because of a fear of falling?   No    Is the patient reluctant to leave their home because of a fear of falling?   No      Home Environment   Living Environment  Private residence      Prior Function   Level of Independence  Independent      Observation/Other Assessments   Focus on Therapeutic Outcomes (FOTO)   70% limitation.      Observation/Other  Assessments-Edema    Edema  --   Mild left shoulder edema.     Posture/Postural Control   Posture Comments  Guarded.      PROM   PROM Assessment Site  Shoulder    Right/Left Shoulder  Left    Left Shoulder Flexion  89 Degrees    Left Shoulder  External Rotation  35 Degrees    Left Shoulder Horizontal ABduction  55 Degrees      Palpation   Palpation comment  Diffuse left shoulder pain currently.     Abduction (not horizontal abduction)= 35 degrees.           Objective measurements completed on examination: See above findings.      [facility] Adult PT Treatment/Exercise - 12/27/19 0001      Vasopneumatic   Number Minutes Vasopneumatic   15 minutes    Vasopnuematic Location   --   Left shoulder.   Vasopneumatic Pressure  Low   Pillow between left elbow and thorax.     Manual Therapy   Manual Therapy  Passive ROM    Passive ROM  In supine:  Gentle left shoulder passive range of motion into left shoulder flexion, ER and abduction x 12 minutes.               PT Short Term Goals - 03/09/19 1458      PT SHORT TERM GOAL #1   Title  STG's=LTG's        PT Long Term Goals - 12/27/19 1334      PT LONG TERM GOAL #1   Title  Independent with a HEP.    Time  8    Period  Weeks    Status  New      PT LONG TERM GOAL #2   Title  Active left shoulder flexion to 145-150 degrees so the patient can easily reach overhead.    Time  8    Period  Weeks    Status  New      PT LONG TERM GOAL #3   Title  Active ER to 70 degrees+ to allow for easily donning/doffing of apparel.    Time  8    Period  Weeks    Status  New      PT LONG TERM GOAL #4   Title  Increase ROM so patient is able to reach behind back to L3.    Time  8    Period  Weeks    Status  New      PT LONG TERM GOAL #5   Title  Increase left shoulder strength to a solid 4+/5 to increase stability for performance of functional activities.    Time  8    Period  Weeks    Status  New      PT LONG TERM  GOAL #6   Title  Perform ADL's with pain not > 2-3/10.    Time  8    Period  Weeks    Status  New             Plan - 12/27/19 1257    Clinical Impression Statement  The patient presents to OPPT s/p open left shoulder RTC repair surgery performed on 11/10/19.  He is actually doing quite well with an expected loss of passive range of motion at this time.  He has compliant to sling usage while in community.  His FOTO limitation score is 70%.  Patient will benefit from skilled physical therapy intervention to address deficits and pain.    Personal Factors and Comorbidities  Comorbidity 2    Comorbidities  RA, DM.    Examination-Activity Limitations  Dressing;Reach Overhead;Other    Stability/Clinical Decision Making  Stable/Uncomplicated    Rehab Potential  Excellent    PT Frequency  3x / week    PT  Duration  8 weeks    PT Treatment/Interventions  ADLs/Self Care Home Management;Cryotherapy;Electrical Stimulation;Ultrasound;Moist Heat;Therapeutic activities;Therapeutic exercise;Manual techniques;Patient/family education;Passive range of motion;Vasopneumatic Device;Iontophoresis 4mg /ml Dexamethasone    PT Next Visit Plan  Begin with PROM to patient's left shoulder, progress to [facility].  Modalities as needed for pain and edema control.    Consulted and Agree with Plan of Care  Patient       Patient will benefit from skilled therapeutic intervention in order to improve the following deficits and impairments:  Pain, Decreased activity tolerance, Decreased range of motion  Visit Diagnosis: Chronic left shoulder pain - Plan: PT plan of care cert/re-cert  Stiffness of left shoulder, not elsewhere classified - Plan: PT plan of care cert/re-cert     Problem List Patient Active Problem List   Diagnosis Date Noted  . Chest pain 11/28/2019  . Symptomatic bradycardia 11/28/2019  . Nontraumatic complete tear of left rotator cuff   . Hypertension 08/31/2019  . S/P left rotator cuff repair/  biceps tenodesis 02/15/19  03/01/2019  . Labral tear of long head of left biceps tendon   . Partial tear of left subscapularis tendon   . Traumatic complete tear of left rotator cuff   . Family history of malignant neoplasm of prostate 07/28/2018  . History of Graves' disease 11/11/2017  . S/P total knee replacement, right 02/02/15 08/12/2017  . S/P total knee replacement, left 07/24/16 08/12/2017  . Hypercholesterolemia 11/08/2015  . History of DVT (deep vein thrombosis) 11/04/2015  . History of cocaine abuse ([place]) 11/04/2015  . History of heroin abuse ([place]) 11/04/2015  . Obesity 11/04/2015  . Parasomnia 11/04/2015  . Benign paroxysmal positional vertigo 10/29/2015  . Controlled type 2 diabetes mellitus without complication ([place]) XX123456  . Elevated prostate specific antigen (PSA) 10/29/2015  . HX: long term anticoagulant use 10/29/2015  . Sensorineural hearing loss 10/29/2015  . Sleep apnea with use of continuous positive airway pressure (CPAP) 10/29/2015  . Chronic radicular lumbar pain 10/29/2015  . Arthrofibrosis of total knee arthroplasty ([place])   . Pulmonary embolus ([place]) 01/09/2015  . History of pulmonary embolism 01/09/2015  . UTI (lower urinary tract infection) 02/21/2014  . Other headache syndrome 02/21/2014  . Nausea with vomiting 02/21/2014  . Rheumatoid arthritis ([place]) 02/21/2014  . Nausea & vomiting 02/21/2014  . Headache(784.0) 02/21/2014  . Lumbar spinal stenosis 02/10/2014    Madilyn Cephas, [place] MPT 12/27/2019, 1:36 PM  [facility] [place], [place], [address] Phone: [phone]   Fax:  [phone]  Name: Willie Howe MRN: [id number] Date of Birth: 02-26-1961

## 2019-12-29 ENCOUNTER — Ambulatory Visit: Payer: Medicare HMO | Admitting: Physical Therapy

## 2019-12-29 ENCOUNTER — Encounter: Payer: Self-pay | Admitting: Physical Therapy

## 2019-12-29 ENCOUNTER — Other Ambulatory Visit: Payer: Self-pay

## 2019-12-29 ENCOUNTER — Other Ambulatory Visit: Payer: Self-pay | Admitting: Orthopedic Surgery

## 2019-12-29 DIAGNOSIS — M25612 Stiffness of left shoulder, not elsewhere classified: Secondary | ICD-10-CM

## 2019-12-29 DIAGNOSIS — M25512 Pain in left shoulder: Secondary | ICD-10-CM | POA: Diagnosis not present

## 2019-12-29 DIAGNOSIS — G8929 Other chronic pain: Secondary | ICD-10-CM

## 2019-12-29 NOTE — Therapy (Signed)
[facility] [place], [place], [address] Phone: [phone]   Fax:  [phone]  Physical Therapy Treatment  Patient Details  Name: ROMULO LOFSTROM MRN: [id number] Date of Birth: 12/09/1960 Referring Provider (PT): Arther Abbott MD.   Encounter Date: 12/29/2019  PT End of Session - 12/29/19 0846    Visit Number  2    Number of Visits  16    Date for PT Re-Evaluation  02/21/20    Authorization Type  FOTO AT LEAST EVERY 5TH VISIT.  PROGRESS NOTE AT 10TH VISIT.  KX MODIFIER AFTER 15 VISITS.    PT Start Time  [phone]    PT Stop Time  0858    PT Time Calculation (min)  41 min    Activity Tolerance  Patient tolerated treatment well    Behavior During Therapy  [facility] for tasks assessed/performed       Past Medical History:  Diagnosis Date  . BPPV (benign paroxysmal positional vertigo)   . DVT (deep venous thrombosis) ([place])   . Graves disease 1996  . History of kidney stones   . History of pulmonary embolism   . Obesity   . Rheumatoid arthritis ([place])   . Sleep apnea    Intolerant of CPAP  . Type 2 diabetes mellitus ([place])     Past Surgical History:  Procedure Laterality Date  . BACK SURGERY  2015  . COLONOSCOPY    . EXAM UNDER ANESTHESIA WITH MANIPULATION OF KNEE Right 04/18/2015   Procedure: MANIPULATION OF RIGHT KNEE UNDER ANESTHESIA;  Surgeon: Carole Civil, MD;  [facility];  Service: Orthopedics;  Laterality: Right;  . IVC Filter     removed December 2016  . [place]  2010  . KNEE ARTHROSCOPY Right 2006   meniscus   . KNEE ARTHROSCOPY WITH MEDIAL MENISECTOMY Left 04/24/2016   Procedure: LEFT KNEE ARTHROSCOPY WITH PARTIAL MEDIAL MENISECTOMY;  Surgeon: Carole Civil, MD;  [facility];  Service: Orthopedics;  Laterality: Left;  . KNEE SURGERY     x 2  . KNEE SURGERY Left 1976   ?ligament repair   . LEFT HEART CATH AND CORONARY ANGIOGRAPHY N/A 11/29/2019   Procedure: LEFT HEART CATH AND  CORONARY ANGIOGRAPHY;  Surgeon: Martinique, Peter M, MD;  Location: [place] [facility];  Service: Cardiovascular;  Laterality: N/A;  . LUMBAR WOUND DEBRIDEMENT N/A 02/23/2014   Procedure: LUMBAR WOUND DEBRIDEMENT;  Surgeon: Faythe Ghee, MD;  Location: [place];  Service: Neurosurgery;  Laterality: N/A;  exploration lumbar wound. repair of dural defect.  Marland Kitchen MENISECTOMY Right    open medial   . NEPHROLITHOTOMY    . PERIPHERAL VASCULAR CATHETERIZATION N/A 01/30/2015   Procedure: IVC Filter Insertion;  Surgeon: Serafina Mitchell, MD;  Location: [place] [facility];  Service: Cardiovascular;  Laterality: N/A;  . PERIPHERAL VASCULAR CATHETERIZATION N/A 04/24/2015   Procedure: IVC Filter Removal;  Surgeon: Serafina Mitchell, MD;  Location: [place] [facility];  Service: Cardiovascular;  Laterality: N/A;  . PERIPHERAL VASCULAR CATHETERIZATION N/A 08/14/2015   Procedure: IVC Filter Removal;  Surgeon: Serafina Mitchell, MD;  Location: [place] [facility];  Service: Cardiovascular;  Laterality: N/A;  . SHOULDER ARTHROSCOPY WITH OPEN ROTATOR CUFF REPAIR Left 11/10/2019   Procedure: SHOULDER ARTHROSCOPY WITH OPEN ROTATOR CUFF REPAIR;  Surgeon: Carole Civil, MD;  [facility];  Service: Orthopedics;  Laterality: Left;  pt tested + on 2/23, does not need Covid test <  90 days  . SHOULDER ARTHROSCOPY WITH ROTATOR CUFF REPAIR AND OPEN BICEPS TENODESIS Left 02/15/2019   Procedure: SHOULDER ARTHROSCOPY WITH open ROTATOR CUFF REPAIR AND OPEN BICEPS TENODESIS;  Surgeon: Carole Civil, MD;  [facility];  Service: Orthopedics;  Laterality: Left;  . SPINE SURGERY  2015   Dr Hal Neer Fusion   . SPINE SURGERY  1999   discectomy  . TOTAL KNEE ARTHROPLASTY Right 02/02/2015   Procedure:  RIGHT TOTAL KNEE ARTHROPLASTY;  Surgeon: Carole Civil, MD;  [facility];  Service: Orthopedics;  Laterality: Right;  LM with pt's daughter of new arrival time (10:45)    . TOTAL KNEE ARTHROPLASTY Left 07/24/2016   Procedure:  TOTAL KNEE ARTHROPLASTY;  Surgeon: Carole Civil, MD;  [facility];  Service: Orthopedics;  Laterality: Left;    There were no vitals filed for this visit.  Subjective Assessment - 12/29/19 0818    Subjective  COVID-19 screen performed prior to patient entering clinic.  Patient arrived with sling and some ongoing pain in shoulder    Pertinent History  Bilateral TKA's, DM, RA, spinal surgery, previous left shoulder surgery.    Patient Stated Goals  Use left UE without pain.    Currently in Pain?  Yes    Pain Score  6     Pain Location  Shoulder    Pain Orientation  Left    Pain Descriptors / Indicators  Discomfort    Pain Type  Surgical pain    Pain Onset  More than a month ago    Pain Frequency  Constant    Aggravating Factors   movement    Pain Relieving Factors  rest                        [facility] Adult PT Treatment/Exercise - 12/29/19 0001      Vasopneumatic   Number Minutes Vasopneumatic   15 minutes    Vasopnuematic Location   Shoulder    Vasopneumatic Pressure  Low    Vasopneumatic Temperature   34 for edema      Manual Therapy   Manual Therapy  Passive ROM    Passive ROM  In supine:  Gentle left shoulder passive range of motion into left shoulder flexion, ER and abduction to improve mobilty               PT Short Term Goals - 03/09/19 1458      PT SHORT TERM GOAL #1   Title  STG's=LTG's        PT Long Term Goals - 12/29/19 0847      PT LONG TERM GOAL #1   Title  Independent with a HEP.    Time  8    Period  Weeks    Status  On-going      PT LONG TERM GOAL #2   Title  Active left shoulder flexion to 145-150 degrees so the patient can easily reach overhead.    Time  8    Period  Weeks    Status  On-going      PT LONG TERM GOAL #3   Title  Active ER to 70 degrees+ to allow for easily donning/doffing of apparel.    Time  8    Period  Weeks    Status  On-going      PT LONG TERM GOAL #4   Title  Increase ROM so patient  is able to reach behind  back to L3.    Time  8    Period  Weeks    Status  On-going      PT LONG TERM GOAL #5   Title  Increase left shoulder strength to a solid 4+/5 to increase stability for performance of functional activities.    Time  8    Period  Weeks    Status  On-going      PT LONG TERM GOAL #6   Title  Perform ADL's with pain not > 2-3/10.    Time  8    Period  Weeks    Status  On-going            Plan - 12/29/19 0849    Clinical Impression Statement  Patient tolerated treatment well today. Today continued with PROM gentle with all motions to improve mobility. Patient doing initial HEP as instructed by PT. Patient is currenlty 7 weeks post op. Goals are progressing at this time.    Personal Factors and Comorbidities  Comorbidity 2    Comorbidities  RA, DM.    Examination-Activity Limitations  Dressing;Reach Overhead;Other    Stability/Clinical Decision Making  Stable/Uncomplicated    Rehab Potential  Excellent    PT Frequency  3x / week    PT Duration  8 weeks    PT Treatment/Interventions  ADLs/Self Care Home Management;Cryotherapy;Electrical Stimulation;Ultrasound;Moist Heat;Therapeutic activities;Therapeutic exercise;Manual techniques;Patient/family education;Passive range of motion;Vasopneumatic Device;Iontophoresis 4mg /ml Dexamethasone    PT Next Visit Plan  cont with PROM to patient's left shoulder, progress to [facility].  Modalities as needed for pain and edema control.    Consulted and Agree with Plan of Care  Patient       Patient will benefit from skilled therapeutic intervention in order to improve the following deficits and impairments:  Pain, Decreased activity tolerance, Decreased range of motion  Visit Diagnosis: Chronic left shoulder pain  Stiffness of left shoulder, not elsewhere classified  Acute pain of left shoulder     Problem List Patient Active Problem List   Diagnosis Date Noted  . Chest pain 11/28/2019  . Symptomatic bradycardia  11/28/2019  . Nontraumatic complete tear of left rotator cuff   . Hypertension 08/31/2019  . S/P left rotator cuff repair/ biceps tenodesis 02/15/19  03/01/2019  . Labral tear of long head of left biceps tendon   . Partial tear of left subscapularis tendon   . Traumatic complete tear of left rotator cuff   . Family history of malignant neoplasm of prostate 07/28/2018  . History of Graves' disease 11/11/2017  . S/P total knee replacement, right 02/02/15 08/12/2017  . S/P total knee replacement, left 07/24/16 08/12/2017  . Hypercholesterolemia 11/08/2015  . History of DVT (deep vein thrombosis) 11/04/2015  . History of cocaine abuse ([place]) 11/04/2015  . History of heroin abuse ([place]) 11/04/2015  . Obesity 11/04/2015  . Parasomnia 11/04/2015  . Benign paroxysmal positional vertigo 10/29/2015  . Controlled type 2 diabetes mellitus without complication ([place]) [id number]  . Elevated prostate specific antigen (PSA) 10/29/2015  . HX: long term anticoagulant use 10/29/2015  . Sensorineural hearing loss 10/29/2015  . Sleep apnea with use of continuous positive airway pressure (CPAP) 10/29/2015  . Chronic radicular lumbar pain 10/29/2015  . Arthrofibrosis of total knee arthroplasty ([place])   . Pulmonary embolus ([place]) 01/09/2015  . History of pulmonary embolism 01/09/2015  . UTI (lower urinary tract infection) 02/21/2014  . Other headache syndrome 02/21/2014  . Nausea with vomiting 02/21/2014  . Rheumatoid arthritis ([place]) 02/21/2014  .  Nausea & vomiting 02/21/2014  . Headache(784.0) 02/21/2014  . Lumbar spinal stenosis 02/10/2014    Duilio Heritage P, PTA 12/29/2019, 9:01 AM  [facility] [place], [place], [address] Phone: [phone]   Fax:  [phone]  Name: REKO TRAFICANTE MRN: [id number] Date of Birth: 04/10/1961

## 2019-12-30 ENCOUNTER — Other Ambulatory Visit: Payer: Self-pay | Admitting: Orthopedic Surgery

## 2019-12-30 MED ORDER — OXYCODONE-ACETAMINOPHEN 5-325 MG PO TABS: 1.0000 | ORAL_TABLET | Freq: Four times a day (QID) | ORAL | 0 refills | Status: DC | PRN

## 2019-12-30 NOTE — Telephone Encounter (Signed)
Patient called this morning stating he could not find his pain medication on mychart yesterday to request a refill.  This medication was not listed under the current medication.  He asked me to put this in for him. I told him that normally medication refills are not done on Friday but that I would still put the message in for him.  Oxycodone/Acetaminophen 5-325 mgs  Qty  42       Sig: Take 1 tablet by mouth every 4 (four) hours as needed for up to 7 days for severe pain.   Patient states he uses [facility] in [place]

## 2020-01-03 ENCOUNTER — Other Ambulatory Visit: Payer: Self-pay

## 2020-01-03 ENCOUNTER — Ambulatory Visit: Payer: Medicare HMO | Admitting: Physical Therapy

## 2020-01-03 DIAGNOSIS — M25512 Pain in left shoulder: Secondary | ICD-10-CM

## 2020-01-03 DIAGNOSIS — M25612 Stiffness of left shoulder, not elsewhere classified: Secondary | ICD-10-CM

## 2020-01-03 DIAGNOSIS — G8929 Other chronic pain: Secondary | ICD-10-CM

## 2020-01-03 NOTE — Therapy (Signed)
[facility] [place], [place], [address] Phone: [phone]   Fax:  [phone]  Physical Therapy Treatment  Patient Details  Name: Willie Howe MRN: [id number] Date of Birth: 16-Jun-1961 Referring Provider (PT): Arther Abbott MD.   Encounter Date: 01/03/2020  PT End of Session - 01/03/20 0935    Visit Number  3    Number of Visits  16    Date for PT Re-Evaluation  02/21/20    Authorization Type  FOTO AT LEAST EVERY 5TH VISIT.  PROGRESS NOTE AT 10TH VISIT.  KX MODIFIER AFTER 15 VISITS.    PT Start Time  0815    PT Stop Time  0906    PT Time Calculation (min)  51 min    Activity Tolerance  Patient tolerated treatment well    Behavior During Therapy  [facility] for tasks assessed/performed       Past Medical History:  Diagnosis Date  . BPPV (benign paroxysmal positional vertigo)   . DVT (deep venous thrombosis) ([place])   . Graves disease 1996  . History of kidney stones   . History of pulmonary embolism   . Obesity   . Rheumatoid arthritis ([place])   . Sleep apnea    Intolerant of CPAP  . Type 2 diabetes mellitus ([place])     Past Surgical History:  Procedure Laterality Date  . BACK SURGERY  2015  . COLONOSCOPY    . EXAM UNDER ANESTHESIA WITH MANIPULATION OF KNEE Right 04/18/2015   Procedure: MANIPULATION OF RIGHT KNEE UNDER ANESTHESIA;  Surgeon: Carole Civil, MD;  Location: [facility];  Service: Orthopedics;  Laterality: Right;  . IVC Filter     removed December 2016  . [place]  2010  . KNEE ARTHROSCOPY Right 2006   meniscus   . KNEE ARTHROSCOPY WITH MEDIAL MENISECTOMY Left 04/24/2016   Procedure: LEFT KNEE ARTHROSCOPY WITH PARTIAL MEDIAL MENISECTOMY;  Surgeon: Carole Civil, MD;  Location: [facility];  Service: Orthopedics;  Laterality: Left;  . KNEE SURGERY     x 2  . KNEE SURGERY Left 1976   ?ligament repair   . LEFT HEART CATH AND CORONARY ANGIOGRAPHY N/A 11/29/2019   Procedure: LEFT HEART CATH AND  CORONARY ANGIOGRAPHY;  Surgeon: Martinique, Peter M, MD;  Location: [place] [facility];  Service: Cardiovascular;  Laterality: N/A;  . LUMBAR WOUND DEBRIDEMENT N/A 02/23/2014   Procedure: LUMBAR WOUND DEBRIDEMENT;  Surgeon: Faythe Ghee, MD;  Location: [place];  Service: Neurosurgery;  Laterality: N/A;  exploration lumbar wound. repair of dural defect.  Marland Kitchen MENISECTOMY Right    open medial   . NEPHROLITHOTOMY    . PERIPHERAL VASCULAR CATHETERIZATION N/A 01/30/2015   Procedure: IVC Filter Insertion;  Surgeon: Serafina Mitchell, MD;  Location: [place] [facility];  Service: Cardiovascular;  Laterality: N/A;  . PERIPHERAL VASCULAR CATHETERIZATION N/A 04/24/2015   Procedure: IVC Filter Removal;  Surgeon: Serafina Mitchell, MD;  Location: [place] [facility];  Service: Cardiovascular;  Laterality: N/A;  . PERIPHERAL VASCULAR CATHETERIZATION N/A 08/14/2015   Procedure: IVC Filter Removal;  Surgeon: Serafina Mitchell, MD;  Location: [place] [facility];  Service: Cardiovascular;  Laterality: N/A;  . SHOULDER ARTHROSCOPY WITH OPEN ROTATOR CUFF REPAIR Left 11/10/2019   Procedure: SHOULDER ARTHROSCOPY WITH OPEN ROTATOR CUFF REPAIR;  Surgeon: Carole Civil, MD;  [facility]: [facility];  Service: Orthopedics;  Laterality: Left;  pt tested + on 2/23, does not need Covid test <  90 days  . SHOULDER ARTHROSCOPY WITH ROTATOR CUFF REPAIR AND OPEN BICEPS TENODESIS Left 02/15/2019   Procedure: SHOULDER ARTHROSCOPY WITH open ROTATOR CUFF REPAIR AND OPEN BICEPS TENODESIS;  Surgeon: Carole Civil, MD;  [facility];  Service: Orthopedics;  Laterality: Left;  . SPINE SURGERY  2015   Dr Hal Neer Fusion   . SPINE SURGERY  1999   discectomy  . TOTAL KNEE ARTHROPLASTY Right 02/02/2015   Procedure:  RIGHT TOTAL KNEE ARTHROPLASTY;  Surgeon: Carole Civil, MD;  [facility];  Service: Orthopedics;  Laterality: Right;  LM with pt's daughter of new arrival time (10:45)    . TOTAL KNEE ARTHROPLASTY Left 07/24/2016   Procedure:  TOTAL KNEE ARTHROPLASTY;  Surgeon: Carole Civil, MD;  [facility];  Service: Orthopedics;  Laterality: Left;    There were no vitals filed for this visit.  Subjective Assessment - 01/03/20 0929    Subjective  COVID-19 screen performed prior to patient entering clinic.  No new complaints.    Pertinent History  Bilateral TKA's, DM, RA, spinal surgery, previous left shoulder surgery.    Patient Stated Goals  Use left UE without pain.    Currently in Pain?  Yes    Pain Location  Shoulder    Pain Orientation  Left    Pain Type  Surgical pain    Pain Onset  More than a month ago                        [facility] Adult PT Treatment/Exercise - 01/03/20 0001      Vasopneumatic   Number Minutes Vasopneumatic   15 minutes    Vasopnuematic Location   --   Left shoulder.   Vasopneumatic Pressure  Low      Manual Therapy   Manual Therapy  Passive ROM    Passive ROM  In supine:  PROM to patient's left shoulder into flexion, ER and abduction x 25 minutes.               PT Short Term Goals - 03/09/19 1458      PT SHORT TERM GOAL #1   Title  STG's=LTG's        PT Long Term Goals - 12/29/19 0847      PT LONG TERM GOAL #1   Title  Independent with a HEP.    Time  8    Period  Weeks    Status  On-going      PT LONG TERM GOAL #2   Title  Active left shoulder flexion to 145-150 degrees so the patient can easily reach overhead.    Time  8    Period  Weeks    Status  On-going      PT LONG TERM GOAL #3   Title  Active ER to 70 degrees+ to allow for easily donning/doffing of apparel.    Time  8    Period  Weeks    Status  On-going      PT LONG TERM GOAL #4   Title  Increase ROM so patient is able to reach behind back to L3.    Time  8    Period  Weeks    Status  On-going      PT LONG TERM GOAL #5   Title  Increase left shoulder strength to a solid 4+/5 to increase stability for performance of functional activities.    Time  8  Period  Weeks     Status  On-going      PT LONG TERM GOAL #6   Title  Perform ADL's with pain not > 2-3/10.    Time  8    Period  Weeks    Status  On-going              Patient will benefit from skilled therapeutic intervention in order to improve the following deficits and impairments:     Visit Diagnosis: Chronic left shoulder pain  Stiffness of left shoulder, not elsewhere classified     Problem List Patient Active Problem List   Diagnosis Date Noted  . Chest pain 11/28/2019  . Symptomatic bradycardia 11/28/2019  . Nontraumatic complete tear of left rotator cuff   . Hypertension 08/31/2019  . S/P left rotator cuff repair/ biceps tenodesis 02/15/19  03/01/2019  . Labral tear of long head of left biceps tendon   . Partial tear of left subscapularis tendon   . Traumatic complete tear of left rotator cuff   . Family history of malignant neoplasm of prostate 07/28/2018  . History of Graves' disease 11/11/2017  . S/P total knee replacement, right 02/02/15 08/12/2017  . S/P total knee replacement, left 07/24/16 08/12/2017  . Hypercholesterolemia 11/08/2015  . History of DVT (deep vein thrombosis) 11/04/2015  . History of cocaine abuse ([place]) 11/04/2015  . History of heroin abuse ([place]) 11/04/2015  . Obesity 11/04/2015  . Parasomnia 11/04/2015  . Benign paroxysmal positional vertigo 10/29/2015  . Controlled type 2 diabetes mellitus without complication ([place]) XX123456  . Elevated prostate specific antigen (PSA) 10/29/2015  . HX: long term anticoagulant use 10/29/2015  . Sensorineural hearing loss 10/29/2015  . Sleep apnea with use of continuous positive airway pressure (CPAP) 10/29/2015  . Chronic radicular lumbar pain 10/29/2015  . Arthrofibrosis of total knee arthroplasty ([place])   . Pulmonary embolus ([place]) 01/09/2015  . History of pulmonary embolism 01/09/2015  . UTI (lower urinary tract infection) 02/21/2014  . Other headache syndrome 02/21/2014  . Nausea with vomiting 02/21/2014   . Rheumatoid arthritis ([place]) 02/21/2014  . Nausea & vomiting 02/21/2014  . Headache(784.0) 02/21/2014  . Lumbar spinal stenosis 02/10/2014    Luisantonio Adinolfi, [place] MPT 01/03/2020, 9:37 AM  [facility] [place], [place], [address] Phone: [phone]   Fax:  [phone]  Name: Willie Howe MRN: [id number] Date of Birth: [birth year]

## 2020-01-05 ENCOUNTER — Other Ambulatory Visit: Payer: Self-pay

## 2020-01-05 ENCOUNTER — Ambulatory Visit: Payer: Medicare HMO | Admitting: Physical Therapy

## 2020-01-05 ENCOUNTER — Encounter: Payer: Self-pay | Admitting: Physical Therapy

## 2020-01-05 DIAGNOSIS — M25512 Pain in left shoulder: Secondary | ICD-10-CM | POA: Diagnosis not present

## 2020-01-05 DIAGNOSIS — M25612 Stiffness of left shoulder, not elsewhere classified: Secondary | ICD-10-CM

## 2020-01-05 DIAGNOSIS — G8929 Other chronic pain: Secondary | ICD-10-CM

## 2020-01-05 NOTE — Therapy (Signed)
[facility] [place], [place], [address] Phone: [phone]   Fax:  [phone]  Physical Therapy Treatment  Patient Details  Name: Willie Howe MRN: [id number] Date of Birth: November 12, 1960 Referring Provider (PT): Arther Abbott MD.   Encounter Date: 01/05/2020  PT End of Session - 01/05/20 0839    Visit Number  4    Number of Visits  16    Date for PT Re-Evaluation  02/21/20    Authorization Type  FOTO AT LEAST EVERY 5TH VISIT.  PROGRESS NOTE AT 10TH VISIT.  KX MODIFIER AFTER 15 VISITS.    PT Start Time  0815    PT Stop Time  0855    PT Time Calculation (min)  40 min    Activity Tolerance  Patient tolerated treatment well    Behavior During Therapy  [facility] for tasks assessed/performed       Past Medical History:  Diagnosis Date  . BPPV (benign paroxysmal positional vertigo)   . DVT (deep venous thrombosis) ([place])   . Graves disease 1996  . History of kidney stones   . History of pulmonary embolism   . Obesity   . Rheumatoid arthritis ([place])   . Sleep apnea    Intolerant of CPAP  . Type 2 diabetes mellitus ([place])     Past Surgical History:  Procedure Laterality Date  . BACK SURGERY  2015  . COLONOSCOPY    . EXAM UNDER ANESTHESIA WITH MANIPULATION OF KNEE Right 04/18/2015   Procedure: MANIPULATION OF RIGHT KNEE UNDER ANESTHESIA;  Surgeon: Carole Civil, MD;  [facility];  Service: Orthopedics;  Laterality: Right;  . IVC Filter     removed December 2016  . [place]  2010  . KNEE ARTHROSCOPY Right 2006   meniscus   . KNEE ARTHROSCOPY WITH MEDIAL MENISECTOMY Left 04/24/2016   Procedure: LEFT KNEE ARTHROSCOPY WITH PARTIAL MEDIAL MENISECTOMY;  Surgeon: Carole Civil, MD;  [facility]: [facility];  Service: Orthopedics;  Laterality: Left;  . KNEE SURGERY     x 2  . KNEE SURGERY Left 1976   ?ligament repair   . LEFT HEART CATH AND CORONARY ANGIOGRAPHY N/A 11/29/2019   Procedure: LEFT HEART CATH AND  CORONARY ANGIOGRAPHY;  Surgeon: Martinique, Peter M, MD;  Location: [place] [facility];  Service: Cardiovascular;  Laterality: N/A;  . LUMBAR WOUND DEBRIDEMENT N/A 02/23/2014   Procedure: LUMBAR WOUND DEBRIDEMENT;  Surgeon: Faythe Ghee, MD;  Location: [place];  Service: Neurosurgery;  Laterality: N/A;  exploration lumbar wound. repair of dural defect.  Marland Kitchen MENISECTOMY Right    open medial   . NEPHROLITHOTOMY    . PERIPHERAL VASCULAR CATHETERIZATION N/A 01/30/2015   Procedure: IVC Filter Insertion;  Surgeon: Serafina Mitchell, MD;  Location: [place] [facility];  Service: Cardiovascular;  Laterality: N/A;  . PERIPHERAL VASCULAR CATHETERIZATION N/A 04/24/2015   Procedure: IVC Filter Removal;  Surgeon: Serafina Mitchell, MD;  Location: [place] [facility];  Service: Cardiovascular;  Laterality: N/A;  . PERIPHERAL VASCULAR CATHETERIZATION N/A 08/14/2015   Procedure: IVC Filter Removal;  Surgeon: Serafina Mitchell, MD;  Location: [place] [facility];  Service: Cardiovascular;  Laterality: N/A;  . SHOULDER ARTHROSCOPY WITH OPEN ROTATOR CUFF REPAIR Left 11/10/2019   Procedure: SHOULDER ARTHROSCOPY WITH OPEN ROTATOR CUFF REPAIR;  Surgeon: Carole Civil, MD;  [facility];  Service: Orthopedics;  Laterality: Left;  pt tested + on 2/23, does not need Covid test <  90 days  . SHOULDER ARTHROSCOPY WITH ROTATOR CUFF REPAIR AND OPEN BICEPS TENODESIS Left 02/15/2019   Procedure: SHOULDER ARTHROSCOPY WITH open ROTATOR CUFF REPAIR AND OPEN BICEPS TENODESIS;  Surgeon: Carole Civil, MD;  [facility]: [facility];  Service: Orthopedics;  Laterality: Left;  . SPINE SURGERY  2015   Dr Hal Neer Fusion   . SPINE SURGERY  1999   discectomy  . TOTAL KNEE ARTHROPLASTY Right 02/02/2015   Procedure:  RIGHT TOTAL KNEE ARTHROPLASTY;  Surgeon: Carole Civil, MD;  [facility]: [facility];  Service: Orthopedics;  Laterality: Right;  LM with pt's daughter of new arrival time (10:45)    . TOTAL KNEE ARTHROPLASTY Left 07/24/2016   Procedure:  TOTAL KNEE ARTHROPLASTY;  Surgeon: Carole Civil, MD;  Location: [facility];  Service: Orthopedics;  Laterality: Left;    There were no vitals filed for this visit.  Subjective Assessment - 01/05/20 0820    Subjective  COVID-19 screen performed prior to patient entering clinic.  Patient arrived with soreness in shoulder today, "may have slept wrong"    Pertinent History  Bilateral TKA's, DM, RA, spinal surgery, previous left shoulder surgery.    Patient Stated Goals  Use left UE without pain.    Currently in Pain?  Yes    Pain Score  6     Pain Location  Shoulder    Pain Orientation  Left    Pain Descriptors / Indicators  Discomfort    Pain Type  Surgical pain    Pain Onset  More than a month ago    Pain Frequency  Intermittent    Aggravating Factors   certain movements    Pain Relieving Factors  at rest                        [facility] Adult PT Treatment/Exercise - 01/05/20 0001      Vasopneumatic   Number Minutes Vasopneumatic   15 minutes    Vasopnuematic Location   Shoulder    Vasopneumatic Pressure  Low    Vasopneumatic Temperature   34 for edema      Manual Therapy   Manual Therapy  Passive ROM    Passive ROM  manual gentle PROM for flexion, IR, ER with holds to improve mobility               PT Short Term Goals - 03/09/19 1458      PT SHORT TERM GOAL #1   Title  STG's=LTG's        PT Long Term Goals - 01/05/20 0829      PT LONG TERM GOAL #1   Title  Independent with a HEP.    Time  8    Period  Weeks    Status  On-going      PT LONG TERM GOAL #2   Title  Active left shoulder flexion to 145-150 degrees so the patient can easily reach overhead.    Time  8    Period  Weeks    Status  On-going      PT LONG TERM GOAL #3   Title  Active ER to 70 degrees+ to allow for easily donning/doffing of apparel.    Time  8    Period  Weeks    Status  On-going      PT LONG TERM GOAL #4   Title  Increase ROM so patient is able to reach behind  back to L3.  Time  8    Period  Weeks    Status  On-going      PT LONG TERM GOAL #5   Title  Increase left shoulder strength to a solid 4+/5 to increase stability for performance of functional activities.    Time  8    Period  Weeks    Status  On-going      PT LONG TERM GOAL #6   Title  Perform ADL's with pain not > 2-3/10.    Time  8    Period  Weeks    Status  On-going            Plan - 01/05/20 [id number]    Clinical Impression Statement  Patient tolerated treatment fair due to increased pain in left shoulder. Today focused on gentle PROM for all motions to improve mobility followed by VASO for pain and edema. Patients ROM is WNL currently and smooth arch range. No progression at this time due to soreness today. Goals progressing at this time.    Personal Factors and Comorbidities  Comorbidity 2    Comorbidities  RA, DM.    Examination-Activity Limitations  Dressing;Reach Overhead;Other    Stability/Clinical Decision Making  Stable/Uncomplicated    Rehab Potential  Excellent    PT Frequency  3x / week    PT Duration  8 weeks    PT Treatment/Interventions  ADLs/Self Care Home Management;Cryotherapy;Electrical Stimulation;Ultrasound;Moist Heat;Therapeutic activities;Therapeutic exercise;Manual techniques;Patient/family education;Passive range of motion;Vasopneumatic Device;Iontophoresis 4mg /ml Dexamethasone    PT Next Visit Plan  cont with PROM to patient's left shoulder, progress to [facility].  Modalities as needed for pain and edema control.    Consulted and Agree with Plan of Care  Patient       Patient will benefit from skilled therapeutic intervention in order to improve the following deficits and impairments:  Pain, Decreased activity tolerance, Decreased range of motion  Visit Diagnosis: Chronic left shoulder pain  Stiffness of left shoulder, not elsewhere classified  Acute pain of left shoulder     Problem List Patient Active Problem List   Diagnosis Date Noted   . Chest pain 11/28/2019  . Symptomatic bradycardia 11/28/2019  . Nontraumatic complete tear of left rotator cuff   . Hypertension 08/31/2019  . S/P left rotator cuff repair/ biceps tenodesis 02/15/19  03/01/2019  . Labral tear of long head of left biceps tendon   . Partial tear of left subscapularis tendon   . Traumatic complete tear of left rotator cuff   . Family history of malignant neoplasm of prostate 07/28/2018  . History of Graves' disease 11/11/2017  . S/P total knee replacement, right 02/02/15 08/12/2017  . S/P total knee replacement, left 07/24/16 08/12/2017  . Hypercholesterolemia 11/08/2015  . History of DVT (deep vein thrombosis) 11/04/2015  . History of cocaine abuse ([place]) 11/04/2015  . History of heroin abuse ([place]) 11/04/2015  . Obesity 11/04/2015  . Parasomnia 11/04/2015  . Benign paroxysmal positional vertigo 10/29/2015  . Controlled type 2 diabetes mellitus without complication ([place]) XX123456  . Elevated prostate specific antigen (PSA) 10/29/2015  . HX: long term anticoagulant use 10/29/2015  . Sensorineural hearing loss 10/29/2015  . Sleep apnea with use of continuous positive airway pressure (CPAP) 10/29/2015  . Chronic radicular lumbar pain 10/29/2015  . Arthrofibrosis of total knee arthroplasty ([place])   . Pulmonary embolus ([name]) 01/09/2015  . History of pulmonary embolism 01/09/2015  . UTI (lower urinary tract infection) 02/21/2014  . Other headache syndrome 02/21/2014  . Nausea  with vomiting 02/21/2014  . Rheumatoid arthritis ([place]) 02/21/2014  . Nausea & vomiting 02/21/2014  . Headache(784.0) 02/21/2014  . Lumbar spinal stenosis 02/10/2014    Christianjames Soule P, PTA 01/05/2020, 8:56 AM  [facility] [place], [place], [address] Phone: [phone]   Fax:  [phone]  Name: ROCKO YARMAN MRN: [id number] Date of Birth: 1961-08-04

## 2020-01-09 ENCOUNTER — Other Ambulatory Visit: Payer: Self-pay

## 2020-01-10 ENCOUNTER — Other Ambulatory Visit: Payer: Self-pay | Admitting: Orthopedic Surgery

## 2020-01-10 ENCOUNTER — Ambulatory Visit: Payer: Medicare HMO | Admitting: *Deleted

## 2020-01-10 DIAGNOSIS — Z9889 Other specified postprocedural states: Secondary | ICD-10-CM

## 2020-01-10 MED ORDER — HYDROCODONE-ACETAMINOPHEN 10-325 MG PO TABS: 1.0000 | ORAL_TABLET | Freq: Four times a day (QID) | ORAL | 0 refills | Status: AC | PRN

## 2020-01-10 NOTE — Progress Notes (Signed)
Change percocet to noroc   Dos 11/10/19/

## 2020-01-12 ENCOUNTER — Ambulatory Visit: Payer: Medicare HMO | Admitting: Physical Therapy

## 2020-01-12 ENCOUNTER — Other Ambulatory Visit: Payer: Self-pay

## 2020-01-12 DIAGNOSIS — G8929 Other chronic pain: Secondary | ICD-10-CM

## 2020-01-12 DIAGNOSIS — M25512 Pain in left shoulder: Secondary | ICD-10-CM | POA: Diagnosis not present

## 2020-01-12 NOTE — Therapy (Signed)
[facility] [place], [place], [address] Phone: [phone]   Fax:  [phone]  Physical Therapy Treatment  Patient Details  Name: Willie Howe MRN: [id number] Date of Birth: 05-Jun-1961 Referring Provider (PT): Arther Abbott MD.   Encounter Date: 01/12/2020  PT End of Session - 01/12/20 1016    Visit Number  5    Number of Visits  16    Date for PT Re-Evaluation  02/21/20    Authorization Type  FOTO AT LEAST EVERY 5TH VISIT.  PROGRESS NOTE AT 10TH VISIT.  KX MODIFIER AFTER 15 VISITS.    PT Start Time  0815    PT Stop Time  0912    PT Time Calculation (min)  57 min    Activity Tolerance  Patient tolerated treatment well    Behavior During Therapy  [facility] for tasks assessed/performed       Past Medical History:  Diagnosis Date  . BPPV (benign paroxysmal positional vertigo)   . DVT (deep venous thrombosis) ([place])   . Graves disease 1996  . History of kidney stones   . History of pulmonary embolism   . Obesity   . Rheumatoid arthritis ([place])   . Sleep apnea    Intolerant of CPAP  . Type 2 diabetes mellitus ([place])     Past Surgical History:  Procedure Laterality Date  . BACK SURGERY  2015  . COLONOSCOPY    . EXAM UNDER ANESTHESIA WITH MANIPULATION OF KNEE Right 04/18/2015   Procedure: MANIPULATION OF RIGHT KNEE UNDER ANESTHESIA;  Surgeon: Carole Civil, MD;  Location: [facility];  Service: Orthopedics;  Laterality: Right;  . IVC Filter     removed December 2016  . [place]  2010  . KNEE ARTHROSCOPY Right 2006   meniscus   . KNEE ARTHROSCOPY WITH MEDIAL MENISECTOMY Left 04/24/2016   Procedure: LEFT KNEE ARTHROSCOPY WITH PARTIAL MEDIAL MENISECTOMY;  Surgeon: Carole Civil, MD;  Location: [facility];  Service: Orthopedics;  Laterality: Left;  . KNEE SURGERY     x 2  . KNEE SURGERY Left 1976   ?ligament repair   . LEFT HEART CATH AND CORONARY ANGIOGRAPHY N/A 11/29/2019   Procedure: LEFT HEART CATH AND  CORONARY ANGIOGRAPHY;  Surgeon: Martinique, Peter M, MD;  Location: [place] [facility];  Service: Cardiovascular;  Laterality: N/A;  . LUMBAR WOUND DEBRIDEMENT N/A 02/23/2014   Procedure: LUMBAR WOUND DEBRIDEMENT;  Surgeon: Faythe Ghee, MD;  Location: [place];  Service: Neurosurgery;  Laterality: N/A;  exploration lumbar wound. repair of dural defect.  Marland Kitchen MENISECTOMY Right    open medial   . NEPHROLITHOTOMY    . PERIPHERAL VASCULAR CATHETERIZATION N/A 01/30/2015   Procedure: IVC Filter Insertion;  Surgeon: Serafina Mitchell, MD;  Location: [place] [facility];  Service: Cardiovascular;  Laterality: N/A;  . PERIPHERAL VASCULAR CATHETERIZATION N/A 04/24/2015   Procedure: IVC Filter Removal;  Surgeon: Serafina Mitchell, MD;  Location: [place] [facility];  Service: Cardiovascular;  Laterality: N/A;  . PERIPHERAL VASCULAR CATHETERIZATION N/A 08/14/2015   Procedure: IVC Filter Removal;  Surgeon: Serafina Mitchell, MD;  Location: [place] [facility];  Service: Cardiovascular;  Laterality: N/A;  . SHOULDER ARTHROSCOPY WITH OPEN ROTATOR CUFF REPAIR Left 11/10/2019   Procedure: SHOULDER ARTHROSCOPY WITH OPEN ROTATOR CUFF REPAIR;  Surgeon: Carole Civil, MD;  [facility]: [facility];  Service: Orthopedics;  Laterality: Left;  pt tested + on 2/23, does not need Covid test <  90 days  . SHOULDER ARTHROSCOPY WITH ROTATOR CUFF REPAIR AND OPEN BICEPS TENODESIS Left 02/15/2019   Procedure: SHOULDER ARTHROSCOPY WITH open ROTATOR CUFF REPAIR AND OPEN BICEPS TENODESIS;  Surgeon: Carole Civil, MD;  [facility]: [facility];  Service: Orthopedics;  Laterality: Left;  . SPINE SURGERY  2015   Dr Hal Neer Fusion   . SPINE SURGERY  1999   discectomy  . TOTAL KNEE ARTHROPLASTY Right 02/02/2015   Procedure:  RIGHT TOTAL KNEE ARTHROPLASTY;  Surgeon: Carole Civil, MD;  [facility]: [facility];  Service: Orthopedics;  Laterality: Right;  LM with pt's daughter of new arrival time (10:45)    . TOTAL KNEE ARTHROPLASTY Left 07/24/2016   Procedure:  TOTAL KNEE ARTHROPLASTY;  Surgeon: Carole Civil, MD;  [facility]: [facility];  Service: Orthopedics;  Laterality: Left;    There were no vitals filed for this visit.  Subjective Assessment - 01/12/20 0944    Subjective  COVID-19 screen performed prior to patient entering clinic.  Doing pretty good.    Pertinent History  Bilateral TKA's, DM, RA, spinal surgery, previous left shoulder surgery.    Patient Stated Goals  Use left UE without pain.    Currently in Pain?  Yes    Pain Score  5     Pain Location  Shoulder    Pain Orientation  Left    Pain Descriptors / Indicators  Discomfort    Pain Type  Surgical pain    Pain Onset  More than a month ago                        [facility] Adult PT Treatment/Exercise - 01/12/20 0001      Shoulder Exercises: Pulleys   Flexion  --   7 minutes.     Shoulder Exercises: ROM/Strengthening   Ranger  Seated UE Ranger x 7 minutes.      Vasopneumatic   Number Minutes Vasopneumatic   15 minutes    Vasopnuematic Location   --   Left shoulder   Vasopneumatic Pressure  Low      Manual Therapy   Manual Therapy  Passive ROM    Passive ROM  In supine:  PROM x 16 minutes to patient's left shoulder into flexion, ER and abduction.                  PT Long Term Goals - 01/05/20 [id number]      PT LONG TERM GOAL #1   Title  Independent with a HEP.    Time  8    Period  Weeks    Status  On-going      PT LONG TERM GOAL #2   Title  Active left shoulder flexion to 145-150 degrees so the patient can easily reach overhead.    Time  8    Period  Weeks    Status  On-going      PT LONG TERM GOAL #3   Title  Active ER to 70 degrees+ to allow for easily donning/doffing of apparel.    Time  8    Period  Weeks    Status  On-going      PT LONG TERM GOAL #4   Title  Increase ROM so patient is able to reach behind back to L3.    Time  8    Period  Weeks    Status  On-going      PT LONG TERM GOAL #5   Title  Increase left shoulder  strength to a solid 4+/5 to increase stability for performance of functional activities.    Time  8    Period  Weeks    Status  On-going      PT LONG TERM GOAL #6   Title  Perform ADL's with pain not > 2-3/10.    Time  8    Period  Weeks    Status  On-going            Plan - 01/12/20 1016    Clinical Impression Statement  Patient did great.  No complaints with the addition of pulleys and seated Ranger.    Personal Factors and Comorbidities  Comorbidity 2    Comorbidities  RA, DM.    Examination-Activity Limitations  Dressing;Reach Overhead;Other    Stability/Clinical Decision Making  Stable/Uncomplicated    Rehab Potential  Excellent    PT Frequency  3x / week    PT Duration  8 weeks    PT Treatment/Interventions  ADLs/Self Care Home Management;Cryotherapy;Electrical Stimulation;Ultrasound;Moist Heat;Therapeutic activities;Therapeutic exercise;Manual techniques;Patient/family education;Passive range of motion;Vasopneumatic Device;Iontophoresis 4mg /ml Dexamethasone    PT Next Visit Plan  cont with PROM to patient's left shoulder, progress to [facility] [place].  Modalities as needed for pain and edema control.    Consulted and Agree with Plan of Care  Patient       Patient will benefit from skilled therapeutic intervention in order to improve the following deficits and impairments:  Pain, Decreased activity tolerance, Decreased range of motion  Visit Diagnosis: Chronic left shoulder pain     Problem List Patient Active Problem List   Diagnosis Date Noted  . Chest pain 11/28/2019  . Symptomatic bradycardia 11/28/2019  . Nontraumatic complete tear of left rotator cuff   . Hypertension 08/31/2019  . S/P left rotator cuff repair/ biceps tenodesis 02/15/19  03/01/2019  . Labral tear of long head of left biceps tendon   . Partial tear of left subscapularis tendon   . Traumatic complete tear of left rotator cuff   . Family history of malignant neoplasm of prostate 07/28/2018  . History  of Graves' disease 11/11/2017  . S/P total knee replacement, right 02/02/15 08/12/2017  . S/P total knee replacement, left 07/24/16 08/12/2017  . Hypercholesterolemia 11/08/2015  . History of DVT (deep vein thrombosis) 11/04/2015  . History of cocaine abuse ([place]) 11/04/2015  . History of heroin abuse ([place]) 11/04/2015  . Obesity 11/04/2015  . Parasomnia 11/04/2015  . Benign paroxysmal positional vertigo 10/29/2015  . Controlled type 2 diabetes mellitus without complication ([place]) [id number]  . Elevated prostate specific antigen (PSA) 10/29/2015  . HX: long term anticoagulant use 10/29/2015  . Sensorineural hearing loss 10/29/2015  . Sleep apnea with use of continuous positive airway pressure (CPAP) 10/29/2015  . Chronic radicular lumbar pain 10/29/2015  . Arthrofibrosis of total knee arthroplasty ([place])   . Pulmonary embolus ([place]) 01/09/2015  . History of pulmonary embolism 01/09/2015  . UTI (lower urinary tract infection) 02/21/2014  . Other headache syndrome 02/21/2014  . Nausea with vomiting 02/21/2014  . Rheumatoid arthritis ([place]) 02/21/2014  . Nausea & vomiting 02/21/2014  . Headache(784.0) 02/21/2014  . Lumbar spinal stenosis 02/10/2014    Willie Howe, Willie Howe 01/12/2020, 10:22 AM  [facility] [address] [place], [place], [address] Phone: [phone]   Fax:  [phone]  Name: Willie Howe MRN: [id number] Date of Birth: September 06, 1960

## 2020-01-18 ENCOUNTER — Ambulatory Visit: Payer: Medicare HMO | Attending: Orthopedic Surgery | Admitting: Physical Therapy

## 2020-01-18 ENCOUNTER — Other Ambulatory Visit: Payer: Self-pay

## 2020-01-18 DIAGNOSIS — G8929 Other chronic pain: Secondary | ICD-10-CM | POA: Diagnosis present

## 2020-01-18 DIAGNOSIS — M25512 Pain in left shoulder: Secondary | ICD-10-CM | POA: Diagnosis not present

## 2020-01-18 DIAGNOSIS — M25612 Stiffness of left shoulder, not elsewhere classified: Secondary | ICD-10-CM

## 2020-01-18 NOTE — Therapy (Signed)
[facility] [place], [place], [address] Phone: [phone]   Fax:  [phone]  Physical Therapy Treatment  Patient Details  Name: Willie Howe MRN: [id number] Date of Birth: [birth year] Referring Provider (PT): Arther Abbott MD.   Encounter Date: 01/18/2020  PT End of Session - 01/18/20 0856    Visit Number  6    Number of Visits  16    Date for PT Re-Evaluation  02/21/20    Authorization Type  FOTO AT LEAST EVERY 5TH VISIT.  PROGRESS NOTE AT 10TH VISIT.  KX MODIFIER AFTER 15 VISITS.    PT Start Time  [phone]    PT Stop Time  0859    PT Time Calculation (min)  43 min    Activity Tolerance  Patient tolerated treatment well    Behavior During Therapy  [facility] for tasks assessed/performed       Past Medical History:  Diagnosis Date  . BPPV (benign paroxysmal positional vertigo)   . DVT (deep venous thrombosis) ([place])   . Graves disease 1996  . History of kidney stones   . History of pulmonary embolism   . Obesity   . Rheumatoid arthritis ([place])   . Sleep apnea    Intolerant of CPAP  . Type 2 diabetes mellitus ([place])     Past Surgical History:  Procedure Laterality Date  . BACK SURGERY  2015  . COLONOSCOPY    . EXAM UNDER ANESTHESIA WITH MANIPULATION OF KNEE Right 04/18/2015   Procedure: MANIPULATION OF RIGHT KNEE UNDER ANESTHESIA;  Surgeon: Carole Civil, MD;  Location: [facility];  Service: Orthopedics;  Laterality: Right;  . IVC Filter     removed December 2016  . [place]  2010  . KNEE ARTHROSCOPY Right 2006   meniscus   . KNEE ARTHROSCOPY WITH MEDIAL MENISECTOMY Left 04/24/2016   Procedure: LEFT KNEE ARTHROSCOPY WITH PARTIAL MEDIAL MENISECTOMY;  Surgeon: Carole Civil, MD;  Location: [facility];  Service: Orthopedics;  Laterality: Left;  . KNEE SURGERY     x 2  . KNEE SURGERY Left 1976   ?ligament repair   . LEFT HEART CATH AND CORONARY ANGIOGRAPHY N/A 11/29/2019   Procedure: LEFT HEART CATH AND  CORONARY ANGIOGRAPHY;  Surgeon: Martinique, Peter M, MD;  Location: [place] [facility];  Service: Cardiovascular;  Laterality: N/A;  . LUMBAR WOUND DEBRIDEMENT N/A 02/23/2014   Procedure: LUMBAR WOUND DEBRIDEMENT;  Surgeon: Faythe Ghee, MD;  Location: [place];  Service: Neurosurgery;  Laterality: N/A;  exploration lumbar wound. repair of dural defect.  Marland Kitchen MENISECTOMY Right    open medial   . NEPHROLITHOTOMY    . PERIPHERAL VASCULAR CATHETERIZATION N/A 01/30/2015   Procedure: IVC Filter Insertion;  Surgeon: Serafina Mitchell, MD;  Location: [place] [facility];  Service: Cardiovascular;  Laterality: N/A;  . PERIPHERAL VASCULAR CATHETERIZATION N/A 04/24/2015   Procedure: IVC Filter Removal;  Surgeon: Serafina Mitchell, MD;  Location: [place] [facility];  Service: Cardiovascular;  Laterality: N/A;  . PERIPHERAL VASCULAR CATHETERIZATION N/A 08/14/2015   Procedure: IVC Filter Removal;  Surgeon: Serafina Mitchell, MD;  Location: [place] [facility];  Service: Cardiovascular;  Laterality: N/A;  . SHOULDER ARTHROSCOPY WITH OPEN ROTATOR CUFF REPAIR Left 11/10/2019   Procedure: SHOULDER ARTHROSCOPY WITH OPEN ROTATOR CUFF REPAIR;  Surgeon: Carole Civil, MD;  Location: [facility];  Service: Orthopedics;  Laterality: Left;  pt tested + on 2/23, does not need Covid test <  90 days  . SHOULDER ARTHROSCOPY WITH ROTATOR CUFF REPAIR AND OPEN BICEPS TENODESIS Left 02/15/2019   Procedure: SHOULDER ARTHROSCOPY WITH open ROTATOR CUFF REPAIR AND OPEN BICEPS TENODESIS;  Surgeon: Carole Civil, MD;  Location: [facility];  Service: Orthopedics;  Laterality: Left;  . SPINE SURGERY  2015   Dr Hal Neer Fusion   . SPINE SURGERY  1999   discectomy  . TOTAL KNEE ARTHROPLASTY Right 02/02/2015   Procedure:  RIGHT TOTAL KNEE ARTHROPLASTY;  Surgeon: Carole Civil, MD;  Location: [facility];  Service: Orthopedics;  Laterality: Right;  LM with pt's daughter of new arrival time (10:45)    . TOTAL KNEE ARTHROPLASTY Left 07/24/2016   Procedure:  TOTAL KNEE ARTHROPLASTY;  Surgeon: Carole Civil, MD;  Location: [facility];  Service: Orthopedics;  Laterality: Left;    There were no vitals filed for this visit.  Subjective Assessment - 01/18/20 0847    Subjective  COVID-19 screen performed prior to patient entering clinic.  I washed my car windows.  Increased shoulder pain today.    Pertinent History  Bilateral TKA's, DM, RA, spinal surgery, previous left shoulder surgery.    Patient Stated Goals  Use left UE without pain.    Currently in Pain?  Yes    Pain Score  6     Pain Location  Shoulder    Pain Orientation  Left    Pain Descriptors / Indicators  Discomfort    Pain Type  Surgical pain    Pain Onset  More than a month ago                        [facility] Adult PT Treatment/Exercise - 01/18/20 0001      Exercises   Exercises  Knee/Hip      Shoulder Exercises: Pulleys   Flexion  5 minutes      Shoulder Exercises: ROM/Strengthening   Ranger  Seated UE ranger x 5 minutes.      Modalities   Modalities  Vasopneumatic      Vasopneumatic   Number Minutes Vasopneumatic   15 minutes    Vasopnuematic Location   --   Left shoulder.   Vasopneumatic Pressure  Low      Manual Therapy   Manual Therapy  Passive ROM    Passive ROM  In supine:  PROM x 14 minutes to patient's left shoulder.                  PT Long Term Goals - 01/05/20 [id number]      PT LONG TERM GOAL #1   Title  Independent with a HEP.    Time  8    Period  Weeks    Status  On-going      PT LONG TERM GOAL #2   Title  Active left shoulder flexion to 145-150 degrees so the patient can easily reach overhead.    Time  8    Period  Weeks    Status  On-going      PT LONG TERM GOAL #3   Title  Active ER to 70 degrees+ to allow for easily donning/doffing of apparel.    Time  8    Period  Weeks    Status  On-going      PT LONG TERM GOAL #4   Title  Increase ROM so patient is able to reach behind back to L3.    Time  8  Period   Weeks    Status  On-going      PT LONG TERM GOAL #5   Title  Increase left shoulder strength to a solid 4+/5 to increase stability for performance of functional activities.    Time  8    Period  Weeks    Status  On-going      PT LONG TERM GOAL #6   Title  Perform ADL's with pain not > 2-3/10.    Time  8    Period  Weeks    Status  On-going            Plan - 01/18/20 [id number]    Clinical Impression Statement  Some increased pain in patient's left shoulder due to washing car windows.  He did well with treatment today.    Personal Factors and Comorbidities  Comorbidity 2    Comorbidities  RA, DM.    Examination-Activity Limitations  Dressing;Reach Overhead;Other    Stability/Clinical Decision Making  Stable/Uncomplicated    Rehab Potential  Excellent    PT Frequency  3x / week    PT Duration  8 weeks    PT Treatment/Interventions  ADLs/Self Care Home Management;Cryotherapy;Electrical Stimulation;Ultrasound;Moist Heat;Therapeutic activities;Therapeutic exercise;Manual techniques;Patient/family education;Passive range of motion;Vasopneumatic Device;Iontophoresis 4mg /ml Dexamethasone    PT Next Visit Plan  cont with PROM to patient's left shoulder, progress to [facility].  Modalities as needed for pain and edema control.    Consulted and Agree with Plan of Care  Patient       Patient will benefit from skilled therapeutic intervention in order to improve the following deficits and impairments:  Pain, Decreased activity tolerance, Decreased range of motion  Visit Diagnosis: Chronic left shoulder pain  Stiffness of left shoulder, not elsewhere classified  Acute pain of left shoulder     Problem List Patient Active Problem List   Diagnosis Date Noted  . Chest pain 11/28/2019  . Symptomatic bradycardia 11/28/2019  . Nontraumatic complete tear of left rotator cuff   . Hypertension 08/31/2019  . S/P left rotator cuff repair/ biceps tenodesis 02/15/19  03/01/2019  . Labral tear of  long head of left biceps tendon   . Partial tear of left subscapularis tendon   . Traumatic complete tear of left rotator cuff   . Family history of malignant neoplasm of prostate 07/28/2018  . History of Graves' disease 11/11/2017  . S/P total knee replacement, right 02/02/15 08/12/2017  . S/P total knee replacement, left 07/24/16 08/12/2017  . Hypercholesterolemia 11/08/2015  . History of DVT (deep vein thrombosis) 11/04/2015  . History of cocaine abuse ([place]) 11/04/2015  . History of heroin abuse ([place]) 11/04/2015  . Obesity 11/04/2015  . Parasomnia 11/04/2015  . Benign paroxysmal positional vertigo 10/29/2015  . Controlled type 2 diabetes mellitus without complication ([place]) [id number]  . Elevated prostate specific antigen (PSA) 10/29/2015  . HX: long term anticoagulant use 10/29/2015  . Sensorineural hearing loss 10/29/2015  . Sleep apnea with use of continuous positive airway pressure (CPAP) 10/29/2015  . Chronic radicular lumbar pain 10/29/2015  . Arthrofibrosis of total knee arthroplasty ([place])   . Pulmonary embolus ([place]) 01/09/2015  . History of pulmonary embolism 01/09/2015  . UTI (lower urinary tract infection) 02/21/2014  . Other headache syndrome 02/21/2014  . Nausea with vomiting 02/21/2014  . Rheumatoid arthritis ([place]) 02/21/2014  . Nausea & vomiting 02/21/2014  . Headache(784.0) 02/21/2014  . Lumbar spinal stenosis 02/10/2014    Mistie Adney, [place] MPT 01/18/2020, 9:05 AM  [facility] [place], [place], [address] Phone: [phone]   Fax:  [phone]  Name: Willie Howe MRN: [id number] Date of Birth: 10-17-1960

## 2020-01-22 ENCOUNTER — Other Ambulatory Visit: Payer: Self-pay

## 2020-01-23 ENCOUNTER — Other Ambulatory Visit: Payer: Self-pay | Admitting: Orthopedic Surgery

## 2020-01-23 DIAGNOSIS — G8918 Other acute postprocedural pain: Secondary | ICD-10-CM

## 2020-01-23 MED ORDER — HYDROCODONE-ACETAMINOPHEN 10-325 MG PO TABS: 1.0000 | ORAL_TABLET | ORAL | 0 refills | Status: AC | PRN

## 2020-01-23 NOTE — Progress Notes (Signed)
Meds ordered this encounter  Medications  . HYDROcodone-acetaminophen (NORCO) 10-325 MG tablet    Sig: Take 1 tablet by mouth every 4 (four) hours as needed for up to 7 days.    Dispense:  42 tablet    Refill:  0

## 2020-01-25 ENCOUNTER — Other Ambulatory Visit: Payer: Self-pay

## 2020-01-25 ENCOUNTER — Ambulatory Visit: Payer: Medicare HMO | Admitting: Physical Therapy

## 2020-01-25 DIAGNOSIS — M25612 Stiffness of left shoulder, not elsewhere classified: Secondary | ICD-10-CM

## 2020-01-25 DIAGNOSIS — M25512 Pain in left shoulder: Secondary | ICD-10-CM | POA: Diagnosis not present

## 2020-01-25 DIAGNOSIS — G8929 Other chronic pain: Secondary | ICD-10-CM

## 2020-01-25 NOTE — Therapy (Signed)
[facility] [place], [place], [address] Phone: [phone]   Fax:  [phone]  Physical Therapy Treatment  Patient Details  Name: Willie Howe MRN: [id number] Date of Birth: [birth year] Referring Provider (PT): Arther Abbott MD.   Encounter Date: 01/25/2020  PT End of Session - 01/25/20 0910    Visit Number  7    Number of Visits  16    Date for PT Re-Evaluation  02/21/20    Authorization Type  FOTO AT LEAST EVERY 5TH VISIT.  PROGRESS NOTE AT 10TH VISIT.  KX MODIFIER AFTER 15 VISITS.    PT Start Time  0815    PT Stop Time  0903    PT Time Calculation (min)  48 min    Activity Tolerance  Patient tolerated treatment well    Behavior During Therapy  [facility] for tasks assessed/performed       Past Medical History:  Diagnosis Date  . BPPV (benign paroxysmal positional vertigo)   . DVT (deep venous thrombosis) ([place])   . Graves disease 1996  . History of kidney stones   . History of pulmonary embolism   . Obesity   . Rheumatoid arthritis ([place])   . Sleep apnea    Intolerant of CPAP  . Type 2 diabetes mellitus ([place])     Past Surgical History:  Procedure Laterality Date  . BACK SURGERY  2015  . COLONOSCOPY    . EXAM UNDER ANESTHESIA WITH MANIPULATION OF KNEE Right 04/18/2015   Procedure: MANIPULATION OF RIGHT KNEE UNDER ANESTHESIA;  Surgeon: Carole Civil, MD;  Location: [facility];  Service: Orthopedics;  Laterality: Right;  . IVC Filter     removed December 2016  . [place]  2010  . KNEE ARTHROSCOPY Right 2006   meniscus   . KNEE ARTHROSCOPY WITH MEDIAL MENISECTOMY Left 04/24/2016   Procedure: LEFT KNEE ARTHROSCOPY WITH PARTIAL MEDIAL MENISECTOMY;  Surgeon: Carole Civil, MD;  Location: [facility];  Service: Orthopedics;  Laterality: Left;  . KNEE SURGERY     x 2  . KNEE SURGERY Left 1976   ?ligament repair   . LEFT HEART CATH AND CORONARY ANGIOGRAPHY N/A 11/29/2019   Procedure: LEFT HEART CATH AND  CORONARY ANGIOGRAPHY;  Surgeon: Martinique, Peter M, MD;  Location: [place] [facility];  Service: Cardiovascular;  Laterality: N/A;  . LUMBAR WOUND DEBRIDEMENT N/A 02/23/2014   Procedure: LUMBAR WOUND DEBRIDEMENT;  Surgeon: Faythe Ghee, MD;  Location: [place];  Service: Neurosurgery;  Laterality: N/A;  exploration lumbar wound. repair of dural defect.  Marland Kitchen MENISECTOMY Right    open medial   . NEPHROLITHOTOMY    . PERIPHERAL VASCULAR CATHETERIZATION N/A 01/30/2015   Procedure: IVC Filter Insertion;  Surgeon: Serafina Mitchell, MD;  Location: [place] [facility];  Service: Cardiovascular;  Laterality: N/A;  . PERIPHERAL VASCULAR CATHETERIZATION N/A 04/24/2015   Procedure: IVC Filter Removal;  Surgeon: Serafina Mitchell, MD;  Location: [place] [facility];  Service: Cardiovascular;  Laterality: N/A;  . PERIPHERAL VASCULAR CATHETERIZATION N/A 08/14/2015   Procedure: IVC Filter Removal;  Surgeon: Serafina Mitchell, MD;  Location: [place] [facility];  Service: Cardiovascular;  Laterality: N/A;  . SHOULDER ARTHROSCOPY WITH OPEN ROTATOR CUFF REPAIR Left 11/10/2019   Procedure: SHOULDER ARTHROSCOPY WITH OPEN ROTATOR CUFF REPAIR;  Surgeon: Carole Civil, MD;  [facility];  Service: Orthopedics;  Laterality: Left;  pt tested + on 2/23, does not need Covid test <  90 days  . SHOULDER ARTHROSCOPY WITH ROTATOR CUFF REPAIR AND OPEN BICEPS TENODESIS Left 02/15/2019   Procedure: SHOULDER ARTHROSCOPY WITH open ROTATOR CUFF REPAIR AND OPEN BICEPS TENODESIS;  Surgeon: Carole Civil, MD;  [facility];  Service: Orthopedics;  Laterality: Left;  . SPINE SURGERY  2015   Dr Hal Neer Fusion   . SPINE SURGERY  1999   discectomy  . TOTAL KNEE ARTHROPLASTY Right 02/02/2015   Procedure:  RIGHT TOTAL KNEE ARTHROPLASTY;  Surgeon: Carole Civil, MD;  [facility];  Service: Orthopedics;  Laterality: Right;  LM with pt's daughter of new arrival time (10:45)    . TOTAL KNEE ARTHROPLASTY Left 07/24/2016   Procedure:  TOTAL KNEE ARTHROPLASTY;  Surgeon: Carole Civil, MD;  [facility];  Service: Orthopedics;  Laterality: Left;    There were no vitals filed for this visit.  Subjective Assessment - 01/25/20 0849    Subjective  COVID-19 screen performed prior to patient entering clinic.  Less pain today.    Pertinent History  Bilateral TKA's, DM, RA, spinal surgery, previous left shoulder surgery.    Patient Stated Goals  Use left UE without pain.    Currently in Pain?  Yes    Pain Score  3     Pain Location  Shoulder    Pain Orientation  Left    Pain Descriptors / Indicators  Discomfort    Pain Type  Surgical pain    Pain Onset  More than a month ago                        [facility] Adult PT Treatment/Exercise - 01/25/20 0001      Exercises   Exercises  Shoulder      Shoulder Exercises: Pulleys   Flexion  --   6 minutes.   Other Pulley Exercises  Seated UE ranger x 6 minutes.    Other Pulley Exercises  Wall ladder x 3 minutes.      Modalities   Modalities  Vasopneumatic      Vasopneumatic   Number Minutes Vasopneumatic   15 minutes    Vasopnuematic Location   --   Left shoulder.   Vasopneumatic Pressure  Low      Manual Therapy   Manual Therapy  Passive ROM    Passive ROM  In supine:  PROM into left shoulder flexion, abduction, ER/Ir x 10 minutes.                  PT Long Term Goals - 01/05/20 6761      PT LONG TERM GOAL #1   Title  Independent with a HEP.    Time  8    Period  Weeks    Status  On-going      PT LONG TERM GOAL #2   Title  Active left shoulder flexion to 145-150 degrees so the patient can easily reach overhead.    Time  8    Period  Weeks    Status  On-going      PT LONG TERM GOAL #3   Title  Active ER to 70 degrees+ to allow for easily donning/doffing of apparel.    Time  8    Period  Weeks    Status  On-going      PT LONG TERM GOAL #4   Title  Increase ROM so patient is able to reach behind back to L3.    Time  8     Period  Weeks    Status  On-going      PT LONG TERM GOAL #5   Title  Increase left shoulder strength to a solid 4+/5 to increase stability for performance of functional activities.    Time  8    Period  Weeks    Status  On-going      PT LONG TERM GOAL #6   Title  Perform ADL's with pain not > 2-3/10.    Time  8    Period  Weeks    Status  On-going            Plan - 01/25/20 0910    Clinical Impression Statement  Patient with less pain.  Progress to wall ladder.  Excellent improvement with regards to his left shoulder range of motion.    Personal Factors and Comorbidities  Comorbidity 2    Comorbidities  RA, DM.    Examination-Activity Limitations  Dressing;Reach Overhead;Other    Rehab Potential  Excellent    PT Frequency  3x / week    PT Duration  8 weeks    PT Treatment/Interventions  ADLs/Self Care Home Management;Cryotherapy;Electrical Stimulation;Ultrasound;Moist Heat;Therapeutic activities;Therapeutic exercise;Manual techniques;Patient/family education;Passive range of motion;Vasopneumatic Device;Iontophoresis 4mg /ml Dexamethasone    PT Next Visit Plan  cont with PROM to patient's left shoulder, progress to [facility].  Modalities as needed for pain and edema control.    Consulted and Agree with Plan of Care  Patient       Patient will benefit from skilled therapeutic intervention in order to improve the following deficits and impairments:  Pain, Decreased activity tolerance, Decreased range of motion  Visit Diagnosis: Chronic left shoulder pain  Stiffness of left shoulder, not elsewhere classified  Acute pain of left shoulder     Problem List Patient Active Problem List   Diagnosis Date Noted  . Chest pain 11/28/2019  . Symptomatic bradycardia 11/28/2019  . Nontraumatic complete tear of left rotator cuff   . Hypertension 08/31/2019  . S/P left rotator cuff repair/ biceps tenodesis 02/15/19  03/01/2019  . Labral tear of long head of left biceps tendon   .  Partial tear of left subscapularis tendon   . Traumatic complete tear of left rotator cuff   . Family history of malignant neoplasm of prostate 07/28/2018  . History of Graves' disease 11/11/2017  . S/P total knee replacement, right 02/02/15 08/12/2017  . S/P total knee replacement, left 07/24/16 08/12/2017  . Hypercholesterolemia 11/08/2015  . History of DVT (deep vein thrombosis) 11/04/2015  . History of cocaine abuse ([place]) 11/04/2015  . History of heroin abuse ([place]) 11/04/2015  . Obesity 11/04/2015  . Parasomnia 11/04/2015  . Benign paroxysmal positional vertigo 10/29/2015  . Controlled type 2 diabetes mellitus without complication ([place]) 02/40/9735  . Elevated prostate specific antigen (PSA) 10/29/2015  . HX: long term anticoagulant use 10/29/2015  . Sensorineural hearing loss 10/29/2015  . Sleep apnea with use of continuous positive airway pressure (CPAP) 10/29/2015  . Chronic radicular lumbar pain 10/29/2015  . Arthrofibrosis of total knee arthroplasty ([place])   . Pulmonary embolus ([place]) 01/09/2015  . History of pulmonary embolism 01/09/2015  . UTI (lower urinary tract infection) 02/21/2014  . Other headache syndrome 02/21/2014  . Nausea with vomiting 02/21/2014  . Rheumatoid arthritis ([place]) 02/21/2014  . Nausea & vomiting 02/21/2014  . Headache(784.0) 02/21/2014  . Lumbar spinal stenosis 02/10/2014    Willie Howe, Willie Howe 01/25/2020, 9:15 AM  [facility]  [place], [place], [address] Phone: [phone]   Fax:  [phone]  Name: Willie Howe MRN: [id number] Date of Birth: Nov 08, 1960

## 2020-01-31 ENCOUNTER — Encounter: Payer: Self-pay | Admitting: Pulmonary Disease

## 2020-01-31 ENCOUNTER — Ambulatory Visit: Payer: Medicare HMO | Admitting: Pulmonary Disease

## 2020-01-31 ENCOUNTER — Other Ambulatory Visit: Payer: Self-pay

## 2020-01-31 VITALS — BP 128/80 | HR 71 | Temp 98.0°F | Ht 75.0 in | Wt 277.4 lb

## 2020-01-31 DIAGNOSIS — G4733 Obstructive sleep apnea (adult) (pediatric): Secondary | ICD-10-CM

## 2020-01-31 DIAGNOSIS — Z9989 Dependence on other enabling machines and devices: Secondary | ICD-10-CM | POA: Diagnosis not present

## 2020-01-31 NOTE — Patient Instructions (Signed)
Pressure changes well-tolerated  Continue using CPAP on a regular basis  I will see you back in about 3 months Call with any significant concerns  Continue weight loss efforts

## 2020-01-31 NOTE — Progress Notes (Signed)
Willie Howe    [id number]    [birth year]  Primary Care Physician:Eksir, Earnest Conroy, MD  Referring Physician: Nickola Major, MD [address] [place],  [place] [address]  Chief complaint:   Patient with a history of obstructive sleep apnea  HPI: Resume using CPAP on a regular basis Pressure change from 14-12, pressure change well-tolerated  Did have a problem with a tooth recently that made tolerating CPAP a little bit more difficult, otherwise has been doing relatively well  No issues with current mask  Diagnosed with obstructive sleep apnea in 2017  Has had some issues with chest pain chest discomfort, chest pressure Recently had a cardiac cath that was negative  His symptoms were felt to be related to his obstructive sleep apnea that is inadequately treated  It was diagnosed in 2017, titrated to a pressure of 14 As pressure changes in the past  His appetite is about the same Is about 20 pounds higher than when he had a study done He continues with weight loss efforts  He was [facility] by [facility] neurology  He does have symptoms of daytime sleepiness, fatigue, dryness  History of hypertension, cardiac arrhythmia, diabetes, blood clots in the past   Outpatient Encounter Medications as of 01/31/2020  Medication Sig  . aspirin EC 81 MG tablet Take 81 mg by mouth daily.  . cetirizine (ZYRTEC) 10 MG tablet Take 10 mg by mouth daily.  Marland Kitchen docusate sodium (COLACE) 100 MG capsule Take 100 mg by mouth every other day. At night  . folic acid (FOLVITE) 1 MG tablet Take 1 mg by mouth daily.  Marland Kitchen ibuprofen (ADVIL) 800 MG tablet Take 1 tablet (800 mg total) by mouth every 8 (eight) hours as needed for moderate pain.  Marland Kitchen inFLIXimab-abda (RENFLEXIS IV) Inject 1 Dose into the vein every 6 (six) weeks.  . methotrexate 50 MG/2ML injection Inject 200 mg into the muscle every Friday.   Marland Kitchen oxyCODONE-acetaminophen (PERCOCET/ROXICET) 5-325 MG tablet Take 1 tablet by mouth  every 6 (six) hours as needed for severe pain.  . predniSONE (DELTASONE) 5 MG tablet Take 5 mg by mouth daily.  . promethazine (PHENERGAN) 12.5 MG tablet Take 1 tablet (12.5 mg total) by mouth every 6 (six) hours as needed for nausea or vomiting.  . tamsulosin (FLOMAX) 0.4 MG CAPS capsule Take 0.4 mg by mouth at bedtime.   Marland Kitchen tiZANidine (ZANAFLEX) 4 MG tablet TAKE 1 TABLET (4 MG TOTAL) BY MOUTH 3 (THREE) TIMES DAILY.  Marland Kitchen losartan (COZAAR) 100 MG tablet Take 0.5 tablets (50 mg total) by mouth daily in the afternoon.  . metFORMIN (GLUCOPHAGE-XR) 500 MG 24 hr tablet Take 1 tablet (500 mg total) by mouth daily with breakfast. (Patient not taking: Reported on 12/26/2019)   No facility-administered encounter medications on file as of 01/31/2020.    Allergies as of 01/31/2020 - Review Complete 01/31/2020  Allergen Reaction Noted  . Statins Other (See Comments) 06/05/2019  . Lisinopril Cough 12/15/2017    Past Medical History:  Diagnosis Date  . BPPV (benign paroxysmal positional vertigo)   . DVT (deep venous thrombosis) ([place])   . Graves disease 1996  . History of kidney stones   . History of pulmonary embolism   . Obesity   . Rheumatoid arthritis ([place])   . Sleep apnea    Intolerant of CPAP  . Type 2 diabetes mellitus ([place])     Past Surgical History:  Procedure Laterality  Date  . BACK SURGERY  2015  . COLONOSCOPY    . EXAM UNDER ANESTHESIA WITH MANIPULATION OF KNEE Right 04/18/2015   Procedure: MANIPULATION OF RIGHT KNEE UNDER ANESTHESIA;  Surgeon: Carole Civil, MD;  [facility]: [facility];  Service: Orthopedics;  Laterality: Right;  . IVC Filter     removed December 2016  . [place]  2010  . KNEE ARTHROSCOPY Right 2006   meniscus   . KNEE ARTHROSCOPY WITH MEDIAL MENISECTOMY Left 04/24/2016   Procedure: LEFT KNEE ARTHROSCOPY WITH PARTIAL MEDIAL MENISECTOMY;  Surgeon: Carole Civil, MD;  Location: [facility];  Service: Orthopedics;  Laterality: Left;  . KNEE SURGERY     x 2    . KNEE SURGERY Left 1976   ?ligament repair   . LEFT HEART CATH AND CORONARY ANGIOGRAPHY N/A 11/29/2019   Procedure: LEFT HEART CATH AND CORONARY ANGIOGRAPHY;  Surgeon: Martinique, Peter M, MD;  Location: [place] [facility];  Service: Cardiovascular;  Laterality: N/A;  . LUMBAR WOUND DEBRIDEMENT N/A 02/23/2014   Procedure: LUMBAR WOUND DEBRIDEMENT;  Surgeon: Faythe Ghee, MD;  Location: [place];  Service: Neurosurgery;  Laterality: N/A;  exploration lumbar wound. repair of dural defect.  Marland Kitchen MENISECTOMY Right    open medial   . NEPHROLITHOTOMY    . PERIPHERAL VASCULAR CATHETERIZATION N/A 01/30/2015   Procedure: IVC Filter Insertion;  Surgeon: Serafina Mitchell, MD;  Location: [place] [facility];  Service: Cardiovascular;  Laterality: N/A;  . PERIPHERAL VASCULAR CATHETERIZATION N/A 04/24/2015   Procedure: IVC Filter Removal;  Surgeon: Serafina Mitchell, MD;  Location: [place] [facility];  Service: Cardiovascular;  Laterality: N/A;  . PERIPHERAL VASCULAR CATHETERIZATION N/A 08/14/2015   Procedure: IVC Filter Removal;  Surgeon: Serafina Mitchell, MD;  Location: [place] [facility];  Service: Cardiovascular;  Laterality: N/A;  . SHOULDER ARTHROSCOPY WITH OPEN ROTATOR CUFF REPAIR Left 11/10/2019   Procedure: SHOULDER ARTHROSCOPY WITH OPEN ROTATOR CUFF REPAIR;  Surgeon: Carole Civil, MD;  [facility];  Service: Orthopedics;  Laterality: Left;  pt tested + on 2/23, does not need Covid test < 90 days  . SHOULDER ARTHROSCOPY WITH ROTATOR CUFF REPAIR AND OPEN BICEPS TENODESIS Left 02/15/2019   Procedure: SHOULDER ARTHROSCOPY WITH open ROTATOR CUFF REPAIR AND OPEN BICEPS TENODESIS;  Surgeon: Carole Civil, MD;  [facility];  Service: Orthopedics;  Laterality: Left;  . SPINE SURGERY  2015   Dr Hal Neer Fusion   . SPINE SURGERY  1999   discectomy  . TOTAL KNEE ARTHROPLASTY Right 02/02/2015   Procedure:  RIGHT TOTAL KNEE ARTHROPLASTY;  Surgeon: Carole Civil, MD;  [facility];  Service:  Orthopedics;  Laterality: Right;  LM with pt's daughter of new arrival time (10:45)    . TOTAL KNEE ARTHROPLASTY Left 07/24/2016   Procedure: TOTAL KNEE ARTHROPLASTY;  Surgeon: Carole Civil, MD;  [facility];  Service: Orthopedics;  Laterality: Left;    Family History  Problem Relation Age of Onset  . Deep vein thrombosis Mother   . Hypertension Mother   . Breast cancer Mother   . Hypertension Father   . Prostate cancer Father   . CAD Sister     Social History   Socioeconomic History  . Marital status: Married    Spouse name: Not on file  . Number of children: 4  . Years of education: College  . Highest education level: Not on file  Occupational History  . Occupation: N/A  Tobacco Use  . Smoking status: Former Smoker    Packs/day: 1.50    Years: 20.00    Pack years: 30.00    Types: Cigarettes    Quit date: 02/03/1995    Years since quitting: 25.0  . Smokeless tobacco: Never Used  Vaping Use  . Vaping Use: Never used  Substance and Sexual Activity  . Alcohol use: No    Alcohol/week: 0.0 standard drinks  . Drug use: No  . Sexual activity: Yes  Other Topics Concern  . Not on file  Social History Narrative   Drinks about 1 cup of coffee a day    Social Determinants of Health   Financial Resource Strain:   . Difficulty of Paying Living Expenses:   Food Insecurity:   . Worried About Charity fundraiser in the Last Year:   . Arboriculturist in the Last Year:   Transportation Needs:   . Film/video editor (Medical):   Marland Kitchen Lack of Transportation (Non-Medical):   Physical Activity:   . Days of Exercise per Week:   . Minutes of Exercise per Session:   Stress:   . Feeling of Stress :   Social Connections:   . Frequency of Communication with Friends and Family:   . Frequency of Social Gatherings with Friends and Family:   . Attends Religious Services:   . Active Member of Clubs or Organizations:   . Attends Archivist Meetings:   Marland Kitchen Marital  Status:   Intimate Partner Violence:   . Fear of Current or Ex-Partner:   . Emotionally Abused:   Marland Kitchen Physically Abused:   . Sexually Abused:     Review of Systems  Constitutional: Negative for fatigue.  Respiratory: Positive for apnea.   Psychiatric/Behavioral: Positive for sleep disturbance.    Vitals:   01/31/20 0851  BP: 128/80  Pulse: 71  Temp: 98 F (36.7 C)  SpO2: 97%     Physical Exam Constitutional:      Appearance: He is well-developed.     Comments: Obese  HENT:     Head: Normocephalic and atraumatic.     Mouth/Throat:     Mouth: Mucous membranes are moist.  Eyes:     General:        Right eye: No discharge.     Conjunctiva/sclera: Conjunctivae normal.     Pupils: Pupils are equal, round, and reactive to light.  Neck:     Thyroid: No thyromegaly.     Trachea: No tracheal deviation.  Cardiovascular:     Rate and Rhythm: Normal rate and regular rhythm.  Pulmonary:     Effort: Pulmonary effort is normal. No respiratory distress.     Breath sounds: Normal breath sounds. No wheezing or rales.  Chest:     Chest wall: No tenderness.  Musculoskeletal:     Cervical back: Normal range of motion and neck supple.  Neurological:     Mental Status: He is alert.    Results of the [place] flowsheet 12/20/2019  Sitting and reading 1  Watching TV 1  Sitting, inactive in a public place (e.g. a theatre or a meeting) 0  As a passenger in a car for an hour without a break 0  Lying down to rest in the afternoon when circumstances permit 1  Sitting and talking to someone 0  Sitting quietly after a lunch without alcohol 1  In a car, while stopped for a few minutes in traffic 0  Total score  4    Data Reviewed: Sleep study reviewed Titrated to CPAP 14 with good good control of apneic events  Compliance report reveals 63% compliance, this is decreased secondary to not being able to tolerate CPAP use with his recent tooth problem Otherwise was not having any  problems AHI of 0.3  Assessment:  Obstructive sleep apnea -Improving compliance -Subjective improvement of symptoms  Mask and pressure intolerance -Resolved with pressure decrease  Plan/Recommendations: Current pressure setting of 12 and seems to be working well  Encouraged to continue working on weight loss efforts  We will see him back in the office in about 31months  Encouraged to call with any significant concerns   Sherrilyn Rist MD [facility] 01/31/2020, 9:04 AM  CC: Nickola Major, MD

## 2020-02-01 ENCOUNTER — Encounter: Payer: Self-pay | Admitting: Physical Therapy

## 2020-02-01 ENCOUNTER — Other Ambulatory Visit: Payer: Self-pay

## 2020-02-01 ENCOUNTER — Ambulatory Visit: Payer: Medicare HMO | Admitting: Physical Therapy

## 2020-02-01 ENCOUNTER — Other Ambulatory Visit: Payer: Self-pay | Admitting: Orthopedic Surgery

## 2020-02-01 DIAGNOSIS — G8929 Other chronic pain: Secondary | ICD-10-CM

## 2020-02-01 DIAGNOSIS — G8918 Other acute postprocedural pain: Secondary | ICD-10-CM

## 2020-02-01 DIAGNOSIS — M25512 Pain in left shoulder: Secondary | ICD-10-CM | POA: Diagnosis not present

## 2020-02-01 DIAGNOSIS — M25612 Stiffness of left shoulder, not elsewhere classified: Secondary | ICD-10-CM

## 2020-02-01 MED ORDER — HYDROCODONE-ACETAMINOPHEN 10-325 MG PO TABS: 1.0000 | ORAL_TABLET | ORAL | 0 refills | Status: DC | PRN

## 2020-02-01 NOTE — Therapy (Signed)
[facility] [place], [place], [address] Phone: [phone]   Fax:  [phone]  Physical Therapy Treatment  Patient Details  Name: Willie Howe MRN: [id number] Date of Birth: [birth year] Referring Provider (PT): Arther Abbott MD.   Encounter Date: 02/01/2020   PT End of Session - 02/01/20 0841    Visit Number 8    Number of Visits 16    Date for PT Re-Evaluation 02/21/20    Authorization Type FOTO AT LEAST EVERY 5TH VISIT.  PROGRESS NOTE AT 10TH VISIT.  KX MODIFIER AFTER 15 VISITS.    PT Start Time [phone]    PT Stop Time 0906    PT Time Calculation (min) 50 min    Activity Tolerance Patient tolerated treatment well    Behavior During Therapy [facility] for tasks assessed/performed           Past Medical History:  Diagnosis Date  . BPPV (benign paroxysmal positional vertigo)   . DVT (deep venous thrombosis) ([place])   . Graves disease 1996  . History of kidney stones   . History of pulmonary embolism   . Obesity   . Rheumatoid arthritis ([place])   . Sleep apnea    Intolerant of CPAP  . Type 2 diabetes mellitus ([place])     Past Surgical History:  Procedure Laterality Date  . BACK SURGERY  2015  . COLONOSCOPY    . EXAM UNDER ANESTHESIA WITH MANIPULATION OF KNEE Right 04/18/2015   Procedure: MANIPULATION OF RIGHT KNEE UNDER ANESTHESIA;  Surgeon: Carole Civil, MD;  Location: [facility];  Service: Orthopedics;  Laterality: Right;  . IVC Filter     removed December 2016  . [place]  2010  . KNEE ARTHROSCOPY Right 2006   meniscus   . KNEE ARTHROSCOPY WITH MEDIAL MENISECTOMY Left 04/24/2016   Procedure: LEFT KNEE ARTHROSCOPY WITH PARTIAL MEDIAL MENISECTOMY;  Surgeon: Carole Civil, MD;  Location: [facility];  Service: Orthopedics;  Laterality: Left;  . KNEE SURGERY     x 2  . KNEE SURGERY Left 1976   ?ligament repair   . LEFT HEART CATH AND CORONARY ANGIOGRAPHY N/A 11/29/2019   Procedure: LEFT HEART CATH AND  CORONARY ANGIOGRAPHY;  Surgeon: Martinique, Peter M, MD;  Location: [place] [facility];  Service: Cardiovascular;  Laterality: N/A;  . LUMBAR WOUND DEBRIDEMENT N/A 02/23/2014   Procedure: LUMBAR WOUND DEBRIDEMENT;  Surgeon: Faythe Ghee, MD;  Location: [place];  Service: Neurosurgery;  Laterality: N/A;  exploration lumbar wound. repair of dural defect.  Marland Kitchen MENISECTOMY Right    open medial   . NEPHROLITHOTOMY    . PERIPHERAL VASCULAR CATHETERIZATION N/A 01/30/2015   Procedure: IVC Filter Insertion;  Surgeon: Serafina Mitchell, MD;  Location: [place] [facility];  Service: Cardiovascular;  Laterality: N/A;  . PERIPHERAL VASCULAR CATHETERIZATION N/A 04/24/2015   Procedure: IVC Filter Removal;  Surgeon: Serafina Mitchell, MD;  Location: [place] [facility];  Service: Cardiovascular;  Laterality: N/A;  . PERIPHERAL VASCULAR CATHETERIZATION N/A 08/14/2015   Procedure: IVC Filter Removal;  Surgeon: Serafina Mitchell, MD;  Location: [place] [facility];  Service: Cardiovascular;  Laterality: N/A;  . SHOULDER ARTHROSCOPY WITH OPEN ROTATOR CUFF REPAIR Left 11/10/2019   Procedure: SHOULDER ARTHROSCOPY WITH OPEN ROTATOR CUFF REPAIR;  Surgeon: Carole Civil, MD;  [facility];  Service: Orthopedics;  Laterality: Left;  pt tested + on 2/23, does not need Covid test < 90 days  .  SHOULDER ARTHROSCOPY WITH ROTATOR CUFF REPAIR AND OPEN BICEPS TENODESIS Left 02/15/2019   Procedure: SHOULDER ARTHROSCOPY WITH open ROTATOR CUFF REPAIR AND OPEN BICEPS TENODESIS;  Surgeon: Carole Civil, MD;  [facility];  Service: Orthopedics;  Laterality: Left;  . SPINE SURGERY  2015   Dr Hal Neer Fusion   . SPINE SURGERY  1999   discectomy  . TOTAL KNEE ARTHROPLASTY Right 02/02/2015   Procedure:  RIGHT TOTAL KNEE ARTHROPLASTY;  Surgeon: Carole Civil, MD;  [facility];  Service: Orthopedics;  Laterality: Right;  LM with pt's daughter of new arrival time (10:45)    . TOTAL KNEE ARTHROPLASTY Left 07/24/2016   Procedure:  TOTAL KNEE ARTHROPLASTY;  Surgeon: Carole Civil, MD;  [facility];  Service: Orthopedics;  Laterality: Left;    There were no vitals filed for this visit.   Subjective Assessment - 02/01/20 0818    Subjective COVID-19 screen performed prior to patient entering clinic.  Patient arrived with soreness in shoulder due to using it a lot, with meds help    Pertinent History Bilateral TKA's, DM, RA, spinal surgery, previous left shoulder surgery.    Patient Stated Goals Use left UE without pain.    Currently in Pain? Yes    Pain Score 5     Pain Location Shoulder    Pain Orientation Left    Pain Descriptors / Indicators Discomfort    Pain Type Surgical pain    Pain Onset More than a month ago    Pain Frequency Intermittent    Aggravating Factors  increased activity    Pain Relieving Factors rest              [facility] PT Assessment - 02/01/20 0001      ROM / Strength   AROM / PROM / Strength AROM;PROM      AROM   AROM Assessment Site Shoulder    Right/Left Shoulder Left    Left Shoulder Flexion 129 Degrees    Left Shoulder External Rotation 50 Degrees      PROM   PROM Assessment Site Shoulder    Right/Left Shoulder Left    Left Shoulder Flexion 143 Degrees    Left Shoulder External Rotation 60 Degrees                         [facility] Adult PT Treatment/Exercise - 02/01/20 0001      Shoulder Exercises: Pulleys   Flexion 5 minutes    Other Pulley Exercises Standing UE ranger x 1min    Other Pulley Exercises Wall ladder x 3 minutes.      Shoulder Exercises: Isometric Strengthening   Flexion 5X5"    Extension 5X5"    External Rotation 5X5"    Internal Rotation 5X5"      Vasopneumatic   Number Minutes Vasopneumatic  15 minutes    Vasopnuematic Location  Shoulder    Vasopneumatic Pressure Low    Vasopneumatic Temperature  34 for edema      Manual Therapy   Manual Therapy Passive ROM    Manual therapy comments manual PROM for left shoulder flexion,  IR, ER and abd to increase mobility with holds end range, rhythmic stabs for ER/IR in scaption, flex/ext at 90                       PT Long Term Goals - 02/01/20 0840      PT LONG TERM  GOAL #1   Title Independent with a HEP.    Time 8    Period Weeks    Status On-going      PT LONG TERM GOAL #2   Title Active left shoulder flexion to 145-150 degrees so the patient can easily reach overhead.    Time 8    Period Weeks    Status On-going   AROM 129 degrees 02/01/20     PT LONG TERM GOAL #3   Title Active ER to 70 degrees+ to allow for easily donning/doffing of apparel.    Time 8    Period Weeks    Status On-going   AROM 50 degrees 02/01/20     PT LONG TERM GOAL #4   Title Increase ROM so patient is able to reach behind back to L3.    Time 8    Status On-going      PT LONG TERM GOAL #5   Title Increase left shoulder strength to a solid 4+/5 to increase stability for performance of functional activities.    Time 8    Period Weeks    Status On-going      PT LONG TERM GOAL #6   Title Perform ADL's with pain not > 2-3/10.    Time 8    Period Weeks    Status On-going                 Plan - 02/01/20 0916    Clinical Impression Statement Patient tolerated treatment well today. Patient progressing with ROM in left shoulder today for all movements. Patient progressing with isometrics and rhythmic stabs today with no increased discomfort. Patient responded well post treatment. goals progressing. MD F/U next week.    Personal Factors and Comorbidities Comorbidity 2    Comorbidities RA, DM.    Examination-Activity Limitations Dressing;Reach Overhead;Other    Stability/Clinical Decision Making Stable/Uncomplicated    Rehab Potential Excellent    PT Frequency 3x / week    PT Duration 8 weeks    PT Treatment/Interventions ADLs/Self Care Home Management;Cryotherapy;Electrical Stimulation;Ultrasound;Moist Heat;Therapeutic activities;Therapeutic exercise;Manual  techniques;Patient/family education;Passive range of motion;Vasopneumatic Device;Iontophoresis 4mg /ml Dexamethasone    PT Next Visit Plan cont with PROM to patient's left shoulder, progress to [facility].  Modalities as needed for pain and edema control. ( FOTO NEXT VISIT )    Consulted and Agree with Plan of Care Patient           Patient will benefit from skilled therapeutic intervention in order to improve the following deficits and impairments:  Pain, Decreased activity tolerance, Decreased range of motion  Visit Diagnosis: Chronic left shoulder pain  Stiffness of left shoulder, not elsewhere classified  Acute pain of left shoulder     Problem List Patient Active Problem List   Diagnosis Date Noted  . Chest pain 11/28/2019  . Symptomatic bradycardia 11/28/2019  . Nontraumatic complete tear of left rotator cuff   . Hypertension 08/31/2019  . S/P left rotator cuff repair/ biceps tenodesis 02/15/19  03/01/2019  . Labral tear of long head of left biceps tendon   . Partial tear of left subscapularis tendon   . Traumatic complete tear of left rotator cuff   . Family history of malignant neoplasm of prostate 07/28/2018  . History of Graves' disease 11/11/2017  . S/P total knee replacement, right 02/02/15 08/12/2017  . S/P total knee replacement, left 07/24/16 08/12/2017  . Hypercholesterolemia 11/08/2015  . History of DVT (deep vein thrombosis) 11/04/2015  . History of cocaine  abuse ([place]) 11/04/2015  . History of heroin abuse ([place]) 11/04/2015  . Obesity 11/04/2015  . Parasomnia 11/04/2015  . Benign paroxysmal positional vertigo 10/29/2015  . Controlled type 2 diabetes mellitus without complication ([place]) 15/40/0867  . Elevated prostate specific antigen (PSA) 10/29/2015  . HX: long term anticoagulant use 10/29/2015  . Sensorineural hearing loss 10/29/2015  . Sleep apnea with use of continuous positive airway pressure (CPAP) 10/29/2015  . Chronic radicular lumbar pain 10/29/2015  .  Arthrofibrosis of total knee arthroplasty ([place])   . Pulmonary embolus ([place]) 01/09/2015  . History of pulmonary embolism 01/09/2015  . UTI (lower urinary tract infection) 02/21/2014  . Other headache syndrome 02/21/2014  . Nausea with vomiting 02/21/2014  . Rheumatoid arthritis ([place]) 02/21/2014  . Nausea & vomiting 02/21/2014  . Headache(784.0) 02/21/2014  . Lumbar spinal stenosis 02/10/2014    Ladean Raya P 02/01/2020, 9:57 AM  [facility] [place], [place], [address] Phone: [phone]   Fax:  [phone]  Name: Willie Howe MRN: [id number] Date of Birth: 1960/10/17

## 2020-02-01 NOTE — Therapy (Signed)
[facility] [place], [place], [address] Phone: [phone]   Fax:  [phone]  Physical Therapy Treatment  Patient Details  Name: Willie Howe MRN: [id number] Date of Birth: 11/08/1960 Referring Provider (PT): Arther Abbott MD.   Encounter Date: 02/01/2020   PT End of Session - 02/01/20 0841    Visit Number 8    Number of Visits 16    Date for PT Re-Evaluation 02/21/20    Authorization Type FOTO AT LEAST EVERY 5TH VISIT.  PROGRESS NOTE AT 10TH VISIT.  KX MODIFIER AFTER 15 VISITS.    PT Start Time [phone]    PT Stop Time 0906    PT Time Calculation (min) 50 min    Activity Tolerance Patient tolerated treatment well    Behavior During Therapy [facility] for tasks assessed/performed           Past Medical History:  Diagnosis Date  . BPPV (benign paroxysmal positional vertigo)   . DVT (deep venous thrombosis) ([place])   . Graves disease 1996  . History of kidney stones   . History of pulmonary embolism   . Obesity   . Rheumatoid arthritis ([place])   . Sleep apnea    Intolerant of CPAP  . Type 2 diabetes mellitus ([place])     Past Surgical History:  Procedure Laterality Date  . BACK SURGERY  2015  . COLONOSCOPY    . EXAM UNDER ANESTHESIA WITH MANIPULATION OF KNEE Right 04/18/2015   Procedure: MANIPULATION OF RIGHT KNEE UNDER ANESTHESIA;  Surgeon: Carole Civil, MD;  Location: [facility];  Service: Orthopedics;  Laterality: Right;  . IVC Filter     removed December 2016  . [place]  2010  . KNEE ARTHROSCOPY Right 2006   meniscus   . KNEE ARTHROSCOPY WITH MEDIAL MENISECTOMY Left 04/24/2016   Procedure: LEFT KNEE ARTHROSCOPY WITH PARTIAL MEDIAL MENISECTOMY;  Surgeon: Carole Civil, MD;  [facility]: [facility];  Service: Orthopedics;  Laterality: Left;  . KNEE SURGERY     x 2  . KNEE SURGERY Left 1976   ?ligament repair   . LEFT HEART CATH AND CORONARY ANGIOGRAPHY N/A 11/29/2019   Procedure: LEFT HEART CATH AND  CORONARY ANGIOGRAPHY;  Surgeon: Martinique, Peter M, MD;  Location: [place] [facility];  Service: Cardiovascular;  Laterality: N/A;  . LUMBAR WOUND DEBRIDEMENT N/A 02/23/2014   Procedure: LUMBAR WOUND DEBRIDEMENT;  Surgeon: Faythe Ghee, MD;  Location: [place];  Service: Neurosurgery;  Laterality: N/A;  exploration lumbar wound. repair of dural defect.  Marland Kitchen MENISECTOMY Right    open medial   . NEPHROLITHOTOMY    . PERIPHERAL VASCULAR CATHETERIZATION N/A 01/30/2015   Procedure: IVC Filter Insertion;  Surgeon: Serafina Mitchell, MD;  Location: [place] [facility];  Service: Cardiovascular;  Laterality: N/A;  . PERIPHERAL VASCULAR CATHETERIZATION N/A 04/24/2015   Procedure: IVC Filter Removal;  Surgeon: Serafina Mitchell, MD;  Location: [place] [facility];  Service: Cardiovascular;  Laterality: N/A;  . PERIPHERAL VASCULAR CATHETERIZATION N/A 08/14/2015   Procedure: IVC Filter Removal;  Surgeon: Serafina Mitchell, MD;  Location: [place] [facility];  Service: Cardiovascular;  Laterality: N/A;  . SHOULDER ARTHROSCOPY WITH OPEN ROTATOR CUFF REPAIR Left 11/10/2019   Procedure: SHOULDER ARTHROSCOPY WITH OPEN ROTATOR CUFF REPAIR;  Surgeon: Carole Civil, MD;  [facility]: [facility];  Service: Orthopedics;  Laterality: Left;  pt tested + on 2/23, does not need Covid test < 90 days  .  SHOULDER ARTHROSCOPY WITH ROTATOR CUFF REPAIR AND OPEN BICEPS TENODESIS Left 02/15/2019   Procedure: SHOULDER ARTHROSCOPY WITH open ROTATOR CUFF REPAIR AND OPEN BICEPS TENODESIS;  Surgeon: Carole Civil, MD;  [facility];  Service: Orthopedics;  Laterality: Left;  . SPINE SURGERY  2015   Dr Hal Neer Fusion   . SPINE SURGERY  1999   discectomy  . TOTAL KNEE ARTHROPLASTY Right 02/02/2015   Procedure:  RIGHT TOTAL KNEE ARTHROPLASTY;  Surgeon: Carole Civil, MD;  [facility]: [facility];  Service: Orthopedics;  Laterality: Right;  LM with pt's daughter of new arrival time (10:45)    . TOTAL KNEE ARTHROPLASTY Left 07/24/2016   Procedure:  TOTAL KNEE ARTHROPLASTY;  Surgeon: Carole Civil, MD;  [facility];  Service: Orthopedics;  Laterality: Left;    There were no vitals filed for this visit.   Subjective Assessment - 02/01/20 0818    Subjective COVID-19 screen performed prior to patient entering clinic.  Patient arrived with soreness in shoulder due to using it a lot, with meds help    Pertinent History Bilateral TKA's, DM, RA, spinal surgery, previous left shoulder surgery.    Patient Stated Goals Use left UE without pain.    Currently in Pain? Yes    Pain Score 5     Pain Location Shoulder    Pain Orientation Left    Pain Descriptors / Indicators Discomfort    Pain Type Surgical pain    Pain Onset More than a month ago    Pain Frequency Intermittent    Aggravating Factors  increased activity    Pain Relieving Factors rest              [facility] PT Assessment - 02/01/20 0001      ROM / Strength   AROM / PROM / Strength AROM;PROM      AROM   AROM Assessment Site Shoulder    Right/Left Shoulder Left    Left Shoulder Flexion 129 Degrees    Left Shoulder External Rotation 50 Degrees      PROM   PROM Assessment Site Shoulder    Right/Left Shoulder Left    Left Shoulder Flexion 143 Degrees    Left Shoulder External Rotation 60 Degrees                         [facility] Adult PT Treatment/Exercise - 02/01/20 0001      Shoulder Exercises: Pulleys   Flexion 5 minutes    Other Pulley Exercises Standing UE ranger x 34min    Other Pulley Exercises Wall ladder x 3 minutes.      Shoulder Exercises: Isometric Strengthening   Flexion 5X5"    Extension 5X5"    External Rotation 5X5"    Internal Rotation 5X5"      Vasopneumatic   Number Minutes Vasopneumatic  15 minutes    Vasopnuematic Location  Shoulder    Vasopneumatic Pressure Low    Vasopneumatic Temperature  34 for edema      Manual Therapy   Manual Therapy Passive ROM    Manual therapy comments manual PROM for left shoulder flexion,  IR, ER and abd to increase mobility with holds end range, rhythmic stabs for ER/IR in scaption, flex/ext at 90                       PT Long Term Goals - 02/01/20 0840      PT LONG TERM  GOAL #1   Title Independent with a HEP.    Time 8    Period Weeks    Status On-going      PT LONG TERM GOAL #2   Title Active left shoulder flexion to 145-150 degrees so the patient can easily reach overhead.    Time 8    Period Weeks    Status On-going   AROM 129 degrees 02/01/20     PT LONG TERM GOAL #3   Title Active ER to 70 degrees+ to allow for easily donning/doffing of apparel.    Time 8    Period Weeks    Status On-going   AROM 50 degrees 02/01/20     PT LONG TERM GOAL #4   Title Increase ROM so patient is able to reach behind back to L3.    Time 8    Status On-going      PT LONG TERM GOAL #5   Title Increase left shoulder strength to a solid 4+/5 to increase stability for performance of functional activities.    Time 8    Period Weeks    Status On-going      PT LONG TERM GOAL #6   Title Perform ADL's with pain not > 2-3/10.    Time 8    Period Weeks    Status On-going                 Plan - 02/01/20 0916    Clinical Impression Statement Patient tolerated treatment well today. Patient progressing with ROM in left shoulder today for all movements. Patient progressing with isometrics and rhythmic stabs today with no increased discomfort. Patient responded well post treatment. goals progressing. MD F/U next week.    Personal Factors and Comorbidities Comorbidity 2    Comorbidities RA, DM.    Examination-Activity Limitations Dressing;Reach Overhead;Other    Stability/Clinical Decision Making Stable/Uncomplicated    Rehab Potential Excellent    PT Frequency 3x / week    PT Duration 8 weeks    PT Treatment/Interventions ADLs/Self Care Home Management;Cryotherapy;Electrical Stimulation;Ultrasound;Moist Heat;Therapeutic activities;Therapeutic exercise;Manual  techniques;Patient/family education;Passive range of motion;Vasopneumatic Device;Iontophoresis 4mg /ml Dexamethasone    PT Next Visit Plan cont with PROM to patient's left shoulder, progress to [facility].  Modalities as needed for pain and edema control.    Consulted and Agree with Plan of Care Patient           Patient will benefit from skilled therapeutic intervention in order to improve the following deficits and impairments:  Pain, Decreased activity tolerance, Decreased range of motion  Visit Diagnosis: Chronic left shoulder pain  Stiffness of left shoulder, not elsewhere classified  Acute pain of left shoulder     Problem List Patient Active Problem List   Diagnosis Date Noted  . Chest pain 11/28/2019  . Symptomatic bradycardia 11/28/2019  . Nontraumatic complete tear of left rotator cuff   . Hypertension 08/31/2019  . S/P left rotator cuff repair/ biceps tenodesis 02/15/19  03/01/2019  . Labral tear of long head of left biceps tendon   . Partial tear of left subscapularis tendon   . Traumatic complete tear of left rotator cuff   . Family history of malignant neoplasm of prostate 07/28/2018  . History of Graves' disease 11/11/2017  . S/P total knee replacement, right 02/02/15 08/12/2017  . S/P total knee replacement, left 07/24/16 08/12/2017  . Hypercholesterolemia 11/08/2015  . History of DVT (deep vein thrombosis) 11/04/2015  . History of cocaine abuse ([place]) 11/04/2015  .  History of heroin abuse ([place]) 11/04/2015  . Obesity 11/04/2015  . Parasomnia 11/04/2015  . Benign paroxysmal positional vertigo 10/29/2015  . Controlled type 2 diabetes mellitus without complication ([place]) 14/70/9295  . Elevated prostate specific antigen (PSA) 10/29/2015  . HX: long term anticoagulant use 10/29/2015  . Sensorineural hearing loss 10/29/2015  . Sleep apnea with use of continuous positive airway pressure (CPAP) 10/29/2015  . Chronic radicular lumbar pain 10/29/2015  . Arthrofibrosis of  total knee arthroplasty ([employer])   . Pulmonary embolus ([place]) 01/09/2015  . History of pulmonary embolism 01/09/2015  . UTI (lower urinary tract infection) 02/21/2014  . Other headache syndrome 02/21/2014  . Nausea with vomiting 02/21/2014  . Rheumatoid arthritis ([place]) 02/21/2014  . Nausea & vomiting 02/21/2014  . Headache(784.0) 02/21/2014  . Lumbar spinal stenosis 02/10/2014    Mikaila Grunert P, PTA 02/01/2020, 9:22 AM  [facility] [place], [place], [address] Phone: [phone]   Fax:  [phone]  Name: Willie Howe MRN: [id number] Date of Birth: [birth year]

## 2020-02-02 ENCOUNTER — Encounter: Payer: Self-pay | Admitting: Pulmonary Disease

## 2020-02-03 ENCOUNTER — Ambulatory Visit: Payer: Medicare HMO | Admitting: Family Medicine

## 2020-02-03 ENCOUNTER — Other Ambulatory Visit: Payer: Self-pay

## 2020-02-03 ENCOUNTER — Encounter: Payer: Self-pay | Admitting: Family Medicine

## 2020-02-03 VITALS — BP 110/70 | HR 79 | Ht 75.0 in | Wt 277.8 lb

## 2020-02-03 DIAGNOSIS — I10 Essential (primary) hypertension: Secondary | ICD-10-CM

## 2020-02-03 DIAGNOSIS — R072 Precordial pain: Secondary | ICD-10-CM | POA: Diagnosis not present

## 2020-02-03 DIAGNOSIS — R001 Bradycardia, unspecified: Secondary | ICD-10-CM

## 2020-02-03 DIAGNOSIS — G4733 Obstructive sleep apnea (adult) (pediatric): Secondary | ICD-10-CM

## 2020-02-03 NOTE — Progress Notes (Signed)
Cardiology Office Note  Date: 02/03/2020   ID: [name], DOB 30-Sep-1960, MRN [id number]  PCP:  Nickola Major, MD  Cardiologist:  Rozann Lesches, MD Electrophysiologist:  None   Chief Complaint: Follow-up precordial pain  History of Present Illness: Willie Howe is a 59 y.o. male with a history of significant DVT, DM type II, OSA, thyroid disease, obesity.  Recent follow-up with [name], PA status post presentation to ED for evaluation of chest pressure with associated dyspnea and dizziness.  Troponins were negative.  Initial EKG showed normal sinus rhythm with bradycardia cardia and right bundle branch block but no acute ischemic changes.  CT was negative for PE and chest ray showed questionable atelectasis versus pneumonia.  Patient had a cardiac catheterization on 11/29/2019 showing normal coronary arteries and normal EF on echo.  TSH was normal.  No AV nodal blocking agents.  Needed a sleep study.  Blood pressure was elevated and amlodipine was added.  When seeing [name], PA on 12/26/2019 patient has stopped losartan and amlodipine 3 days prior because of significant the dizziness.  He complained of weakness.  His blood pressure was 124/60.  He has been back on CPAP for a week and was watching his salt closely.  Drinking two 7 ounce Mountain Dew's and 16 ounces coffee daily.  He presents today with no complaints at all.  He denies any chest pain, dizziness, or dyspnea.  He is normotensive with a blood pressure of 110/70.  Heart rate is 79.  He did not restart his amlodipine he is currently taking losartan 50 mg daily.  States he plans to start walking daily.  And he started back on his CPAP therapy.  States since he has been walking he is feeling much better.  He is getting outside and being more active including cutting his shrubs and various other yard work.  Denies any orthostatic symptoms, palpitations or arrhythmias, CVA or TIA-like symptoms, bleeding,  claudication, DVT or PE-like symptoms, or lower extremity edema.   Past Medical History:  Diagnosis Date  . BPPV (benign paroxysmal positional vertigo)   . DVT (deep venous thrombosis) ([place])   . Graves disease 1996  . History of kidney stones   . History of pulmonary embolism   . Obesity   . Rheumatoid arthritis ([place])   . Sleep apnea    Intolerant of CPAP  . Type 2 diabetes mellitus ([place])     Past Surgical History:  Procedure Laterality Date  . BACK SURGERY  2015  . COLONOSCOPY    . EXAM UNDER ANESTHESIA WITH MANIPULATION OF KNEE Right 04/18/2015   Procedure: MANIPULATION OF RIGHT KNEE UNDER ANESTHESIA;  Surgeon: Carole Civil, MD;  Location: [facility];  Service: Orthopedics;  Laterality: Right;  . IVC Filter     removed December 2016  . [place]  2010  . KNEE ARTHROSCOPY Right 2006   meniscus   . KNEE ARTHROSCOPY WITH MEDIAL MENISECTOMY Left 04/24/2016   Procedure: LEFT KNEE ARTHROSCOPY WITH PARTIAL MEDIAL MENISECTOMY;  Surgeon: Carole Civil, MD;  Location: [facility];  Service: Orthopedics;  Laterality: Left;  . KNEE SURGERY     x 2  . KNEE SURGERY Left 1976   ?ligament repair   . LEFT HEART CATH AND CORONARY ANGIOGRAPHY N/A 11/29/2019   Procedure: LEFT HEART CATH AND CORONARY ANGIOGRAPHY;  Surgeon: Martinique, Peter M, MD;  Location: [facility];  Service: Cardiovascular;  Laterality: N/A;  .  LUMBAR WOUND DEBRIDEMENT N/A 02/23/2014   Procedure: LUMBAR WOUND DEBRIDEMENT;  Surgeon: Faythe Ghee, MD;  Location: [facility];  Service: Neurosurgery;  Laterality: N/A;  exploration lumbar wound. repair of dural defect.  Marland Kitchen MENISECTOMY Right    open medial   . NEPHROLITHOTOMY    . PERIPHERAL VASCULAR CATHETERIZATION N/A 01/30/2015   Procedure: IVC Filter Insertion;  Surgeon: Serafina Mitchell, MD;  Location: [facility];  Service: Cardiovascular;  Laterality: N/A;  . PERIPHERAL VASCULAR CATHETERIZATION N/A 04/24/2015   Procedure: IVC Filter Removal;  Surgeon: Serafina Mitchell, MD;  Location: [facility];  Service: Cardiovascular;  Laterality: N/A;  . PERIPHERAL VASCULAR CATHETERIZATION N/A 08/14/2015   Procedure: IVC Filter Removal;  Surgeon: Serafina Mitchell, MD;  Location: [facility];  Service: Cardiovascular;  Laterality: N/A;  . SHOULDER ARTHROSCOPY WITH OPEN ROTATOR CUFF REPAIR Left 11/10/2019   Procedure: SHOULDER ARTHROSCOPY WITH OPEN ROTATOR CUFF REPAIR;  Surgeon: Carole Civil, MD;  [facility];  Service: Orthopedics;  Laterality: Left;  pt tested + on 2/23, does not need Covid test < 90 days  . SHOULDER ARTHROSCOPY WITH ROTATOR CUFF REPAIR AND OPEN BICEPS TENODESIS Left 02/15/2019   Procedure: SHOULDER ARTHROSCOPY WITH open ROTATOR CUFF REPAIR AND OPEN BICEPS TENODESIS;  Surgeon: Carole Civil, MD;  [facility];  Service: Orthopedics;  Laterality: Left;  . SPINE SURGERY  2015   Dr Hal Neer Fusion   . SPINE SURGERY  1999   discectomy  . TOTAL KNEE ARTHROPLASTY Right 02/02/2015   Procedure:  RIGHT TOTAL KNEE ARTHROPLASTY;  Surgeon: Carole Civil, MD;  [facility];  Service: Orthopedics;  Laterality: Right;  LM with pt's daughter of new arrival time (10:45)    . TOTAL KNEE ARTHROPLASTY Left 07/24/2016   Procedure: TOTAL KNEE ARTHROPLASTY;  Surgeon: Carole Civil, MD;  [facility]: [facility];  Service: Orthopedics;  Laterality: Left;    Current Outpatient Medications  Medication Sig Dispense Refill  . aspirin EC 81 MG tablet Take 81 mg by mouth daily.    . cetirizine (ZYRTEC) 10 MG tablet Take 10 mg by mouth daily.    Marland Kitchen docusate sodium (COLACE) 100 MG capsule Take 100 mg by mouth every other day. At night    . folic acid (FOLVITE) 1 MG tablet Take 1 mg by mouth daily.    Marland Kitchen HYDROcodone-acetaminophen (NORCO) 10-325 MG tablet Take 1 tablet by mouth every 4 (four) hours as needed. 42 tablet 0  . ibuprofen (ADVIL) 800 MG tablet Take 1 tablet (800 mg total) by mouth every 8 (eight) hours as needed for moderate pain.  90 tablet 5  . inFLIXimab-abda (RENFLEXIS IV) Inject 1 Dose into the vein every 6 (six) weeks.    Marland Kitchen losartan (COZAAR) 100 MG tablet Take 0.5 tablets (50 mg total) by mouth daily in the afternoon. 45 tablet 3  . metFORMIN (GLUCOPHAGE-XR) 500 MG 24 hr tablet Take 1 tablet (500 mg total) by mouth daily with breakfast. 30 tablet 0  . methotrexate 50 MG/2ML injection Inject 200 mg into the muscle every Friday.     . predniSONE (DELTASONE) 5 MG tablet Take 5 mg by mouth daily.    . promethazine (PHENERGAN) 12.5 MG tablet Take 1 tablet (12.5 mg total) by mouth every 6 (six) hours as needed for nausea or vomiting. 30 tablet 0  . tamsulosin (FLOMAX) 0.4 MG CAPS capsule Take 0.4 mg by mouth at bedtime.     Marland Kitchen  tiZANidine (ZANAFLEX) 4 MG tablet TAKE 1 TABLET (4 MG TOTAL) BY MOUTH 3 (THREE) TIMES DAILY. 90 tablet 1   No current facility-administered medications for this visit.   Allergies:  Statins and Lisinopril   Social History: The patient  reports that he quit smoking about 25 years ago. His smoking use included cigarettes. He has a 30.00 pack-year smoking history. He has never used smokeless tobacco. He reports that he does not drink alcohol and does not use drugs.   Family History: The patient's family history includes Breast cancer in his mother; CAD in his sister; Deep vein thrombosis in his mother; Hypertension in his father and mother; Prostate cancer in his father.   ROS:  Please see the history of present illness. Otherwise, complete review of systems is positive for none.  All other systems are reviewed and negative.   Physical Exam: VS:  BP 110/70   Pulse 79   Ht 6\' 3"  (1.905 m)   Wt 277 lb 12.8 oz (126 kg)   SpO2 97%   BMI 34.72 kg/m , BMI Body mass index is 34.72 kg/m.  Wt Readings from Last 3 Encounters:  02/03/20 277 lb 12.8 oz (126 kg)  01/31/20 277 lb 6.4 oz (125.8 kg)  12/26/19 277 lb (125.6 kg)    General: Patient appears comfortable at rest. Neck: Supple, no elevated JVP  or carotid bruits, no thyromegaly. Lungs: Clear to auscultation, nonlabored breathing at rest. Cardiac: Regular rate and rhythm, no S3 or significant systolic murmur, no pericardial rub. Extremities: No pitting edema, distal pulses 2+. Skin: Warm and dry. Musculoskeletal: No kyphosis. Neuropsychiatric: Alert and oriented x3, affect grossly appropriate.  ECG:  EKG on 11/27/2019 showed sinus bradycardia rate of 47, right bundle branch block and left anterior fascicular block.   Recent Labwork: 11/27/2019: ALT 26; AST 24 11/28/2019: BUN 12; Creatinine, Ser 0.70; Hemoglobin 12.7; Platelets 345; Potassium 3.7; Sodium 137; TSH 1.616     Component Value Date/Time   CHOL  10/10/2009 0420    173        ATP III CLASSIFICATION:  <200     mg/dL   Desirable  200-239  mg/dL   Borderline High  >=240    mg/dL   High          TRIG 155 (H) 10/10/2009 0420   HDL 37 (L) 10/10/2009 0420   CHOLHDL 4.7 10/10/2009 0420   VLDL 31 10/10/2009 0420   LDLCALC (H) 10/10/2009 0420    105        Total Cholesterol/HDL:CHD Risk Coronary Heart Disease Risk Table                     Men   Women  1/2 Average Risk   3.4   3.3  Average Risk       5.0   4.4  2 X Average Risk   9.6   7.1  3 X Average Risk  23.4   11.0        Use the calculated Patient Ratio above and the CHD Risk Table to determine the patient's CHD Risk.        ATP III CLASSIFICATION (LDL):  <100     mg/dL   Optimal  100-129  mg/dL   Near or Above                    Optimal  130-159  mg/dL   Borderline  160-189  mg/dL   High  >190  mg/dL   Very High    Other Studies Reviewed Today:  2D echo 2019-12-03  Study Conclusions   - Left ventricle: The cavity size was normal. Systolic function was  normal. The estimated ejection fraction was in the range of 60%  to 65%. Wall motion was normal; there were no regional wall  motion abnormalities. There was an increased relative  contribution of atrial contraction to ventricular filling.   Doppler parameters are consistent with abnormal left ventricular  relaxation (grade 1 diastolic dysfunction).  - Right ventricle: The cavity size was mildly dilated. Wall  thickness was normal. Systolic function was severely reduced.  - Pulmonary arteries: PA peak pressure: 82 mm Hg (S).   Cath 6/81/1572  LV end diastolic pressure is normal. 1. Normal coronary anatomy 2. Normal LV EDP  Assessment and Plan:  1. Precordial pain   2. Sinus bradycardia   3. Essential hypertension   4. OSA (obstructive sleep apnea)    1. Precordial pain Patient denies any further precordial pain since discharge from hospital on 11/29/2019.  Continue aspirin 81 mg daily.  2. Sinus bradycardia Heart rate today is 79.  He denies any dizziness, lightheadedness, presyncope or syncopal episodes.  3. Essential hypertension Patient is normotensive today at 110/70.  Continue losartan 50 mg daily.  4. OSA (obstructive sleep apnea) Patient states he is restarted his CPAP therapy.  States he is feeling much better and is more active.  He has been walking every day and performing yard work.  States he has been actually feeling much better since being more active.  Medication Adjustments/Labs and Tests Ordered: Current medicines are reviewed at length with the patient today.  Concerns regarding medicines are outlined above.   Disposition: Follow-up with Dr. Domenic Polite or APP 1 year  Signed, Levell July, NP 02/03/2020 1:30 PM    [facility], [place], [place] [address] Phone: [phone]; Fax: [phone]

## 2020-02-03 NOTE — Patient Instructions (Signed)
Medication Instructions:  Your physician recommends that you continue on your current medications as directed. Please refer to the Current Medication list given to you today.  *If you need a refill on your cardiac medications before your next appointment, please call your pharmacy*   Lab Work: None If you have labs (blood work) drawn today and your tests are completely normal, you will receive your results only by:  [place] (if you have MyChart) OR  A paper copy in the mail If you have any lab test that is abnormal or we need to change your treatment, we will call you to review the results.   Testing/Procedures: None   Follow-Up: At [facility], you and your health needs are our priority.  As part of our continuing mission to provide you with exceptional heart care, we have created designated Provider Care Teams.  These Care Teams include your primary Cardiologist (physician) and Advanced Practice Providers (APPs -  Physician Assistants and Nurse Practitioners) who all work together to provide you with the care you need, when you need it.  We recommend signing up for the patient portal called "MyChart".  Sign up information is provided on this After Visit Summary.  MyChart is used to connect with patients for Virtual Visits (Telemedicine).  Patients are able to view lab/test results, encounter notes, upcoming appointments, etc.  Non-urgent messages can be sent to your provider as well.   To learn more about what you can do with MyChart, go to [email or web address].    Your next appointment:   12 month(s)  The format for your next appointment:   In Person  Provider:   You may see Rozann Lesches, MD or one of the following Advanced Practice Providers on your designated Care Team:    Bernerd Pho, PA-C   Ermalinda Barrios, PA-C     Other Instructions None

## 2020-02-07 ENCOUNTER — Other Ambulatory Visit: Payer: Self-pay

## 2020-02-07 ENCOUNTER — Encounter: Payer: Self-pay | Admitting: Physical Therapy

## 2020-02-07 ENCOUNTER — Ambulatory Visit: Payer: Medicare HMO | Admitting: Physical Therapy

## 2020-02-07 DIAGNOSIS — M25512 Pain in left shoulder: Secondary | ICD-10-CM

## 2020-02-07 DIAGNOSIS — G8929 Other chronic pain: Secondary | ICD-10-CM

## 2020-02-07 DIAGNOSIS — M25612 Stiffness of left shoulder, not elsewhere classified: Secondary | ICD-10-CM

## 2020-02-07 NOTE — Therapy (Signed)
[facility] [place], [place], [address] Phone: [phone]   Fax:  [phone]  Physical Therapy Treatment  Patient Details  Name: Willie Howe MRN: [id number] Date of Birth: [birth year] Referring Provider (PT): Arther Abbott MD.   Encounter Date: 02/07/2020   PT End of Session - 02/07/20 0851    Visit Number 9    Number of Visits 16    Date for PT Re-Evaluation 02/21/20    Authorization Type FOTO AT LEAST EVERY 5TH VISIT.  PROGRESS NOTE AT 10TH VISIT.  KX MODIFIER AFTER 15 VISITS.    PT Start Time [phone]    PT Stop Time 0900    PT Time Calculation (min) 44 min    Activity Tolerance Patient tolerated treatment well    Behavior During Therapy WFL for tasks assessed/performed           Past Medical History:  Diagnosis Date  . BPPV (benign paroxysmal positional vertigo)   . DVT (deep venous thrombosis) ([place])   . Graves disease 1996  . History of kidney stones   . History of pulmonary embolism   . Obesity   . Rheumatoid arthritis ([place])   . Sleep apnea    Intolerant of CPAP  . Type 2 diabetes mellitus ([place])     Past Surgical History:  Procedure Laterality Date  . BACK SURGERY  2015  . COLONOSCOPY    . EXAM UNDER ANESTHESIA WITH MANIPULATION OF KNEE Right 04/18/2015   Procedure: MANIPULATION OF RIGHT KNEE UNDER ANESTHESIA;  Surgeon: Carole Civil, MD;  Location: [facility];  Service: Orthopedics;  Laterality: Right;  . IVC Filter     removed December 2016  . [place]  2010  . KNEE ARTHROSCOPY Right 2006   meniscus   . KNEE ARTHROSCOPY WITH MEDIAL MENISECTOMY Left 04/24/2016   Procedure: LEFT KNEE ARTHROSCOPY WITH PARTIAL MEDIAL MENISECTOMY;  Surgeon: Carole Civil, MD;  Location[facility];  Service: Orthopedics;  Laterality: Left;  . KNEE SURGERY     x 2  . KNEE SURGERY Left 1976   ?ligament repair   . LEFT HEART CATH AND CORONARY ANGIOGRAPHY N/A 11/29/2019   Procedure: LEFT HEART CATH AND  CORONARY ANGIOGRAPHY;  Surgeon: Martinique, Peter M, MD;  Location: [facility];  Service: Cardiovascular;  Laterality: N/A;  . LUMBAR WOUND DEBRIDEMENT N/A 02/23/2014   Procedure: LUMBAR WOUND DEBRIDEMENT;  Surgeon: Faythe Ghee, MD;  Location: [facility];  Service: Neurosurgery;  Laterality: N/A;  exploration lumbar wound. repair of dural defect.  Marland Kitchen MENISECTOMY Right    open medial   . NEPHROLITHOTOMY    . PERIPHERAL VASCULAR CATHETERIZATION N/A 01/30/2015   Procedure: IVC Filter Insertion;  Surgeon: Serafina Mitchell, MD;  Location: [facility];  Service: Cardiovascular;  Laterality: N/A;  . PERIPHERAL VASCULAR CATHETERIZATION N/A 04/24/2015   Procedure: IVC Filter Removal;  Surgeon: Serafina Mitchell, MD;  Location: [facility];  Service: Cardiovascular;  Laterality: N/A;  . PERIPHERAL VASCULAR CATHETERIZATION N/A 08/14/2015   Procedure: IVC Filter Removal;  Surgeon: Serafina Mitchell, MD;  Location: [place];  Service: Cardiovascular;  Laterality: N/A;  . SHOULDER ARTHROSCOPY WITH OPEN ROTATOR CUFF REPAIR Left 11/10/2019   Procedure: SHOULDER ARTHROSCOPY WITH OPEN ROTATOR CUFF REPAIR;  Surgeon: Carole Civil, MD;  Location: [facility];  Service: Orthopedics;  Laterality: Left;  pt tested + on 2/23, does not need Covid test < 90 days  .  SHOULDER ARTHROSCOPY WITH ROTATOR CUFF REPAIR AND OPEN BICEPS TENODESIS Left 02/15/2019   Procedure: SHOULDER ARTHROSCOPY WITH open ROTATOR CUFF REPAIR AND OPEN BICEPS TENODESIS;  Surgeon: Carole Civil, MD;  [facility];  Service: Orthopedics;  Laterality: Left;  . SPINE SURGERY  2015   Dr Hal Neer Fusion   . SPINE SURGERY  1999   discectomy  . TOTAL KNEE ARTHROPLASTY Right 02/02/2015   Procedure:  RIGHT TOTAL KNEE ARTHROPLASTY;  Surgeon: Carole Civil, MD;  [facility];  Service: Orthopedics;  Laterality: Right;  LM with pt's daughter of new arrival time (10:45)    . TOTAL KNEE ARTHROPLASTY Left 07/24/2016   Procedure:  TOTAL KNEE ARTHROPLASTY;  Surgeon: Carole Civil, MD;  Location: [facility];  Service: Orthopedics;  Laterality: Left;    There were no vitals filed for this visit.   Subjective Assessment - 02/07/20 0827    Subjective COVID-19 screen performed prior to patient entering clinic.  Doing better.    Pertinent History Bilateral TKA's, DM, RA, spinal surgery, previous left shoulder surgery.                             [facility] Adult PT Treatment/Exercise - 02/07/20 0001      Exercises   Exercises Shoulder      Shoulder Exercises: Pulleys   Flexion --   6 minutes.   Other Pulley Exercises Wall ladder x 6 minutes.    Other Pulley Exercises UE Ranger on wall x 6 minutes.      Modalities   Modalities Vasopneumatic      Vasopneumatic   Number Minutes Vasopneumatic  15 minutes    Vasopnuematic Location  --   Left shoulder.   Vasopneumatic Pressure Low      Manual Therapy   Manual Therapy Passive ROM    Passive ROM In supine:  PROM x 5 minutes with focus on flexion of left shoulder.                       PT Long Term Goals - 02/01/20 0840      PT LONG TERM GOAL #1   Title Independent with a HEP.    Time 8    Period Weeks    Status On-going      PT LONG TERM GOAL #2   Title Active left shoulder flexion to 145-150 degrees so the patient can easily reach overhead.    Time 8    Period Weeks    Status On-going   AROM 129 degrees 02/01/20     PT LONG TERM GOAL #3   Title Active ER to 70 degrees+ to allow for easily donning/doffing of apparel.    Time 8    Period Weeks    Status On-going   AROM 50 degrees 02/01/20     PT LONG TERM GOAL #4   Title Increase ROM so patient is able to reach behind back to L3.    Time 8    Status On-going      PT LONG TERM GOAL #5   Title Increase left shoulder strength to a solid 4+/5 to increase stability for performance of functional activities.    Time 8    Period Weeks    Status On-going      PT LONG TERM GOAL  #6   Title Perform ADL's with pain not > 2-3/10.    Time 8  Period Weeks    Status On-going                 Plan - 02/07/20 0846    Clinical Impression Statement Full left shoulder ER.  Soem lack in flexion but otherwise very doing very well.    Personal Factors and Comorbidities Comorbidity 2    Comorbidities RA, DM.    Examination-Activity Limitations Dressing;Reach Overhead;Other    Rehab Potential Excellent    PT Frequency 3x / week    PT Duration 8 weeks    PT Treatment/Interventions ADLs/Self Care Home Management;Cryotherapy;Electrical Stimulation;Ultrasound;Moist Heat;Therapeutic activities;Therapeutic exercise;Manual techniques;Patient/family education;Passive range of motion;Vasopneumatic Device;Iontophoresis 4mg /ml Dexamethasone    PT Next Visit Plan cont with PROM to patient's left shoulder, progress to [facility].  Modalities as needed for pain and edema control. ( FOTO NEXT VISIT )    Consulted and Agree with Plan of Care Patient           Patient will benefit from skilled therapeutic intervention in order to improve the following deficits and impairments:  Pain, Decreased activity tolerance, Decreased range of motion  Visit Diagnosis: Chronic left shoulder pain  Stiffness of left shoulder, not elsewhere classified  Acute pain of left shoulder     Problem List Patient Active Problem List   Diagnosis Date Noted  . Chest pain 11/28/2019  . Symptomatic bradycardia 11/28/2019  . Nontraumatic complete tear of left rotator cuff   . Hypertension 08/31/2019  . S/P left rotator cuff repair/ biceps tenodesis 02/15/19  03/01/2019  . Labral tear of long head of left biceps tendon   . Partial tear of left subscapularis tendon   . Traumatic complete tear of left rotator cuff   . Family history of malignant neoplasm of prostate 07/28/2018  . History of Graves' disease 11/11/2017  . S/P total knee replacement, right 02/02/15 08/12/2017  . S/P total knee replacement,  left 07/24/16 08/12/2017  . Hypercholesterolemia 11/08/2015  . History of DVT (deep vein thrombosis) 11/04/2015  . History of cocaine abuse ([place]) 11/04/2015  . History of heroin abuse ([place]) 11/04/2015  . Obesity 11/04/2015  . Parasomnia 11/04/2015  . Benign paroxysmal positional vertigo 10/29/2015  . Controlled type 2 diabetes mellitus without complication ([place]) 79/48/0165  . Elevated prostate specific antigen (PSA) 10/29/2015  . HX: long term anticoagulant use 10/29/2015  . Sensorineural hearing loss 10/29/2015  . Sleep apnea with use of continuous positive airway pressure (CPAP) 10/29/2015  . Chronic radicular lumbar pain 10/29/2015  . Arthrofibrosis of total knee arthroplasty ([place])   . Pulmonary embolus ([place]) 01/09/2015  . History of pulmonary embolism 01/09/2015  . UTI (lower urinary tract infection) 02/21/2014  . Other headache syndrome 02/21/2014  . Nausea with vomiting 02/21/2014  . Rheumatoid arthritis ([place]) 02/21/2014  . Nausea & vomiting 02/21/2014  . Headache(784.0) 02/21/2014  . Lumbar spinal stenosis 02/10/2014    Aristotelis Vilardi, [place] MPT 02/07/2020, 9:03 AM  [facility] [address] [place], [place], [address] Phone: [phone]   Fax:  [phone]  Name: Willie Howe MRN: [id number] Date of Birth: March 18, 1961

## 2020-02-08 ENCOUNTER — Ambulatory Visit (INDEPENDENT_AMBULATORY_CARE_PROVIDER_SITE_OTHER): Payer: Medicare HMO | Admitting: Orthopedic Surgery

## 2020-02-08 ENCOUNTER — Encounter: Payer: Self-pay | Admitting: Orthopedic Surgery

## 2020-02-08 DIAGNOSIS — G8918 Other acute postprocedural pain: Secondary | ICD-10-CM

## 2020-02-08 MED ORDER — HYDROCODONE-ACETAMINOPHEN 7.5-325 MG PO TABS: 1.0000 | ORAL_TABLET | Freq: Four times a day (QID) | ORAL | 0 refills | Status: DC | PRN

## 2020-02-08 NOTE — Progress Notes (Signed)
Chief Complaint  Patient presents with  . Post-op Follow-up    still has soreness, especially after therapy 11/10/19 date of surgery    [name] has 3 months and now from the redo of his cuff repair he can raise his arm approximately 120 degrees with some scapular substitution he has had some soreness after therapy but that seems to go well  Refill hydrocodone finish therapy convert to home therapy for another 3 months follow-up in 2 months  Meds ordered this encounter  Medications  . HYDROcodone-acetaminophen (NORCO) 7.5-325 MG tablet    Sig: Take 1 tablet by mouth every 6 (six) hours as needed for moderate pain.    Dispense:  28 tablet    Refill:  0   Fu 2 months

## 2020-02-08 NOTE — Addendum Note (Signed)
Addended by: Carole Civil on: 02/08/2020 09:08 AM   Modules accepted: Orders

## 2020-02-13 ENCOUNTER — Ambulatory Visit: Payer: Medicare HMO | Admitting: Physician Assistant

## 2020-02-14 ENCOUNTER — Other Ambulatory Visit: Payer: Self-pay

## 2020-02-14 ENCOUNTER — Ambulatory Visit: Payer: Medicare HMO | Admitting: Physical Therapy

## 2020-02-14 DIAGNOSIS — M25612 Stiffness of left shoulder, not elsewhere classified: Secondary | ICD-10-CM

## 2020-02-14 DIAGNOSIS — M25512 Pain in left shoulder: Secondary | ICD-10-CM

## 2020-02-14 DIAGNOSIS — G8929 Other chronic pain: Secondary | ICD-10-CM

## 2020-02-14 NOTE — Therapy (Signed)
[facility] [place], [place], [address] Phone: [phone]   Fax:  [phone]  Physical Therapy Treatment  Patient Details  Name: Willie Howe MRN: [id number] Date of Birth: 1961/05/27 Referring Provider (PT): Arther Abbott MD.   Encounter Date: 02/14/2020   PT End of Session - 02/14/20 0821    Visit Number 10    Number of Visits 16    Date for PT Re-Evaluation 02/21/20    Authorization Type FOTO AT LEAST EVERY 5TH VISIT.  PROGRESS NOTE AT 10TH VISIT.  KX MODIFIER AFTER 15 VISITS.    PT Start Time 0815    PT Stop Time 0900    PT Time Calculation (min) 45 min    Activity Tolerance Patient tolerated treatment well    Behavior During Therapy WFL for tasks assessed/performed           Past Medical History:  Diagnosis Date  . BPPV (benign paroxysmal positional vertigo)   . DVT (deep venous thrombosis) ([place])   . Graves disease 1996  . History of kidney stones   . History of pulmonary embolism   . Obesity   . Rheumatoid arthritis ([place])   . Sleep apnea    Intolerant of CPAP  . Type 2 diabetes mellitus ([place])     Past Surgical History:  Procedure Laterality Date  . BACK SURGERY  2015  . COLONOSCOPY    . EXAM UNDER ANESTHESIA WITH MANIPULATION OF KNEE Right 04/18/2015   Procedure: MANIPULATION OF RIGHT KNEE UNDER ANESTHESIA;  Surgeon: Carole Civil, MD;  Location: [facility];  Service: Orthopedics;  Laterality: Right;  . IVC Filter     removed December 2016  . [place]  2010  . KNEE ARTHROSCOPY Right 2006   meniscus   . KNEE ARTHROSCOPY WITH MEDIAL MENISECTOMY Left 04/24/2016   Procedure: LEFT KNEE ARTHROSCOPY WITH PARTIAL MEDIAL MENISECTOMY;  Surgeon: Carole Civil, MD;  Location: [facility];  Service: Orthopedics;  Laterality: Left;  . KNEE SURGERY     x 2  . KNEE SURGERY Left 1976   ?ligament repair   . LEFT HEART CATH AND CORONARY ANGIOGRAPHY N/A 11/29/2019   Procedure: LEFT HEART CATH AND  CORONARY ANGIOGRAPHY;  Surgeon: Martinique, Peter M, MD;  Location: [facility];  Service: Cardiovascular;  Laterality: N/A;  . LUMBAR WOUND DEBRIDEMENT N/A 02/23/2014   Procedure: LUMBAR WOUND DEBRIDEMENT;  Surgeon: Faythe Ghee, MD;  Location: [facility];  Service: Neurosurgery;  Laterality: N/A;  exploration lumbar wound. repair of dural defect.  Marland Kitchen MENISECTOMY Right    open medial   . NEPHROLITHOTOMY    . PERIPHERAL VASCULAR CATHETERIZATION N/A 01/30/2015   Procedure: IVC Filter Insertion;  Surgeon: Serafina Mitchell, MD;  Location: [facility];  Service: Cardiovascular;  Laterality: N/A;  . PERIPHERAL VASCULAR CATHETERIZATION N/A 04/24/2015   Procedure: IVC Filter Removal;  Surgeon: Serafina Mitchell, MD;  Location: [facility];  Service: Cardiovascular;  Laterality: N/A;  . PERIPHERAL VASCULAR CATHETERIZATION N/A 08/14/2015   Procedure: IVC Filter Removal;  Surgeon: Serafina Mitchell, MD;  Location: [facility];  Service: Cardiovascular;  Laterality: N/A;  . SHOULDER ARTHROSCOPY WITH OPEN ROTATOR CUFF REPAIR Left 11/10/2019   Procedure: SHOULDER ARTHROSCOPY WITH OPEN ROTATOR CUFF REPAIR;  Surgeon: Carole Civil, MD;  Location: [facility];  Service: Orthopedics;  Laterality: Left;  pt tested + on 2/23, does not need Covid test < 90 days  .  SHOULDER ARTHROSCOPY WITH ROTATOR CUFF REPAIR AND OPEN BICEPS TENODESIS Left 02/15/2019   Procedure: SHOULDER ARTHROSCOPY WITH open ROTATOR CUFF REPAIR AND OPEN BICEPS TENODESIS;  Surgeon: Carole Civil, MD;  [facility]: [facility];  Service: Orthopedics;  Laterality: Left;  . SPINE SURGERY  2015   Dr Hal Neer Fusion   . SPINE SURGERY  1999   discectomy  . TOTAL KNEE ARTHROPLASTY Right 02/02/2015   Procedure:  RIGHT TOTAL KNEE ARTHROPLASTY;  Surgeon: Carole Civil, MD;  Location: [facility];  Service: Orthopedics;  Laterality: Right;  LM with pt's daughter of new arrival time (10:45)    . TOTAL KNEE ARTHROPLASTY Left 07/24/2016   Procedure:  TOTAL KNEE ARTHROPLASTY;  Surgeon: Carole Civil, MD;  [facility]: [facility];  Service: Orthopedics;  Laterality: Left;    There were no vitals filed for this visit.   Subjective Assessment - 02/14/20 0827    Subjective COVID-19 screen performed prior to patient entering clinic.  Getting better.    Pertinent History Bilateral TKA's, DM, RA, spinal surgery, previous left shoulder surgery.    Patient Stated Goals Use left UE without pain.    Currently in Pain? Yes    Pain Score 4     Pain Location Shoulder    Pain Orientation Left    Pain Descriptors / Indicators Discomfort    Pain Onset More than a month ago                             [facility] Adult PT Treatment/Exercise - 02/14/20 0001      Exercises   Exercises Shoulder      Shoulder Exercises: Pulleys   Flexion 5 minutes    Other Pulley Exercises Wall ladder x 5 minutes.    Other Pulley Exercises UE ranger on wall x 5 minutes      Shoulder Exercises: ROM/Strengthening   UBE (Upper Arm Bike) 120 RPM's x 8 minutes.                       PT Long Term Goals - 02/14/20 0831      PT LONG TERM GOAL #1   Title Independent with a HEP.    Time 8      PT LONG TERM GOAL #3   Title Active ER to 70 degrees+ to allow for easily donning/doffing of apparel.    Baseline 85 degrees.    Time 8    Period Weeks    Status On-going                 Plan - 02/14/20 0848    Clinical Impression Statement The patient is doing very well.  Added low-level UBE without difficulty.    Personal Factors and Comorbidities Comorbidity 2    Comorbidities RA, DM.    Examination-Activity Limitations Dressing;Reach Overhead;Other    Stability/Clinical Decision Making Stable/Uncomplicated    Rehab Potential Excellent    PT Frequency 3x / week    PT Treatment/Interventions ADLs/Self Care Home Management;Cryotherapy;Electrical Stimulation;Ultrasound;Moist Heat;Therapeutic activities;Therapeutic exercise;Manual  techniques;Patient/family education;Passive range of motion;Vasopneumatic Device;Iontophoresis 30m/ml Dexamethasone    PT Next Visit Plan cont with PROM to patient's left shoulder[facility]  Modalities as needed for pain and edema control. ( FOTO NEXT VISIT )    Consulted and Agree with Plan of Care Patient           Patient will benefit from skilled therapeutic intervention in  order to improve the following deficits and impairments:  Pain, Decreased activity tolerance, Decreased range of motion  Visit Diagnosis: Chronic left shoulder pain  Stiffness of left shoulder, not elsewhere classified  Acute pain of left shoulder     Problem List Patient Active Problem List   Diagnosis Date Noted  . Chest pain 11/28/2019  . Symptomatic bradycardia 11/28/2019  . Nontraumatic complete tear of left rotator cuff   . Hypertension 08/31/2019  . S/P left rotator cuff repair/ biceps tenodesis 02/15/19  03/01/2019  . Labral tear of long head of left biceps tendon   . Partial tear of left subscapularis tendon   . Traumatic complete tear of left rotator cuff   . Family history of malignant neoplasm of prostate 07/28/2018  . History of Graves' disease 11/11/2017  . S/P total knee replacement, right 02/02/15 08/12/2017  . S/P total knee replacement, left 07/24/16 08/12/2017  . Hypercholesterolemia 11/08/2015  . History of DVT (deep vein thrombosis) 11/04/2015  . History of cocaine abuse ([place]) 11/04/2015  . History of heroin abuse ([place]) 11/04/2015  . Obesity 11/04/2015  . Parasomnia 11/04/2015  . Benign paroxysmal positional vertigo 10/29/2015  . Controlled type 2 diabetes mellitus without complication ([place]) 91/22/5834  . Elevated prostate specific antigen (PSA) 10/29/2015  . HX: long term anticoagulant use 10/29/2015  . Sensorineural hearing loss 10/29/2015  . Sleep apnea with use of continuous positive airway pressure (CPAP) 10/29/2015  . Chronic radicular lumbar pain 10/29/2015  .  Arthrofibrosis of total knee arthroplasty ([place])   . Pulmonary embolus ([place]) 01/09/2015  . History of pulmonary embolism 01/09/2015  . UTI (lower urinary tract infection) 02/21/2014  . Other headache syndrome 02/21/2014  . Nausea with vomiting 02/21/2014  . Rheumatoid arthritis ([place]) 02/21/2014  . Nausea & vomiting 02/21/2014  . Headache(784.0) 02/21/2014  . Lumbar spinal stenosis 02/10/2014      Progress Note Reporting Period 12/27/19 to 02/14/20.  See note below for Objective Data and Assessment of Progress/Goals.  Patient is making excellent progress.  ER goal met.    Mali Mylee Falin MPT 02/14/2020, 9:00 AM  [facility] [address] [place], [place], [address] Phone: [phone]   Fax:  [phone]

## 2020-02-15 ENCOUNTER — Other Ambulatory Visit: Payer: Self-pay

## 2020-02-15 DIAGNOSIS — G8918 Other acute postprocedural pain: Secondary | ICD-10-CM

## 2020-02-15 MED ORDER — HYDROCODONE-ACETAMINOPHEN 7.5-325 MG PO TABS: 1.0000 | ORAL_TABLET | Freq: Four times a day (QID) | ORAL | 0 refills | Status: DC | PRN

## 2020-02-21 ENCOUNTER — Other Ambulatory Visit: Payer: Self-pay

## 2020-02-21 ENCOUNTER — Ambulatory Visit: Payer: Medicare HMO | Attending: Orthopedic Surgery | Admitting: Physical Therapy

## 2020-02-21 ENCOUNTER — Encounter: Payer: Self-pay | Admitting: Physical Therapy

## 2020-02-21 DIAGNOSIS — G8929 Other chronic pain: Secondary | ICD-10-CM | POA: Insufficient documentation

## 2020-02-21 DIAGNOSIS — M25512 Pain in left shoulder: Secondary | ICD-10-CM | POA: Insufficient documentation

## 2020-02-21 DIAGNOSIS — M25612 Stiffness of left shoulder, not elsewhere classified: Secondary | ICD-10-CM | POA: Insufficient documentation

## 2020-02-21 NOTE — Therapy (Signed)
[facility] [address] [place], [place], [address] Phone: [phone]   Fax:  [phone]  Physical Therapy Treatment  Patient Details  Name: Willie Howe MRN: [id number] Date of Birth: [birth year] Referring Provider (PT): Fuller Canada MD.   Encounter Date: 02/21/2020   PT End of Session - 02/21/20 0825    Visit Number 11    Number of Visits 16    Date for PT Re-Evaluation 02/21/20    Authorization Type FOTO AT LEAST EVERY 5TH VISIT.  PROGRESS NOTE AT 10TH VISIT.  KX MODIFIER AFTER 15 VISITS.    PT Start Time [phone]    PT Stop Time 0900    PT Time Calculation (min) 43 min    Activity Tolerance Patient tolerated treatment well    Behavior During Therapy WFL for tasks assessed/performed           Past Medical History:  Diagnosis Date  . BPPV (benign paroxysmal positional vertigo)   . DVT (deep venous thrombosis) ([facility])   . Graves disease 1996  . History of kidney stones   . History of pulmonary embolism   . Obesity   . Rheumatoid arthritis ([facility])   . Sleep apnea    Intolerant of CPAP  . Type 2 diabetes mellitus ([facility])     Past Surgical History:  Procedure Laterality Date  . BACK SURGERY  2015  . COLONOSCOPY    . EXAM UNDER ANESTHESIA WITH MANIPULATION OF KNEE Right 04/18/2015   Procedure: MANIPULATION OF RIGHT KNEE UNDER ANESTHESIA;  Surgeon: Vickki Hearing, MD;  [facility];  Service: Orthopedics;  Laterality: Right;  . IVC Filter     removed December 2016  . KIDNEY STONE SURGERY  2010  . KNEE ARTHROSCOPY Right 2006   meniscus   . KNEE ARTHROSCOPY WITH MEDIAL MENISECTOMY Left 04/24/2016   Procedure: LEFT KNEE ARTHROSCOPY WITH PARTIAL MEDIAL MENISECTOMY;  Surgeon: Vickki Hearing, MD;  Location: [facility];  Service: Orthopedics;  Laterality: Left;  . KNEE SURGERY     x 2  . KNEE SURGERY Left 1976   ?ligament repair   . LEFT HEART CATH AND CORONARY ANGIOGRAPHY N/A 11/29/2019   Procedure: LEFT HEART CATH AND  CORONARY ANGIOGRAPHY;  Surgeon: [place], Peter M, MD;  [facility]: [facility];  Service: Cardiovascular;  Laterality: N/A;  . LUMBAR WOUND DEBRIDEMENT N/A 02/23/2014   Procedure: LUMBAR WOUND DEBRIDEMENT;  Surgeon: Reinaldo Meeker, MD;  [facility];  Service: Neurosurgery;  Laterality: N/A;  exploration lumbar wound. repair of dural defect.  Marland Kitchen MENISECTOMY Right    open medial   . NEPHROLITHOTOMY    . PERIPHERAL VASCULAR CATHETERIZATION N/A 01/30/2015   Procedure: IVC Filter Insertion;  Surgeon: Nada Libman, MD;  Location: [facility];  Service: Cardiovascular;  Laterality: N/A;  . PERIPHERAL VASCULAR CATHETERIZATION N/A 04/24/2015   Procedure: IVC Filter Removal;  Surgeon: Nada Libman, MD;  [facility];  Service: Cardiovascular;  Laterality: N/A;  . PERIPHERAL VASCULAR CATHETERIZATION N/A 08/14/2015   Procedure: IVC Filter Removal;  Surgeon: Nada Libman, MD;  [facility];  Service: Cardiovascular;  Laterality: N/A;  . SHOULDER ARTHROSCOPY WITH OPEN ROTATOR CUFF REPAIR Left 11/10/2019   Procedure: SHOULDER ARTHROSCOPY WITH OPEN ROTATOR CUFF REPAIR;  Surgeon: Vickki Hearing, MD;  [facility]: [facility];  Service: Orthopedics;  Laterality: Left;  pt tested + on 2/23, does not need Covid test < 90 days  .  SHOULDER ARTHROSCOPY WITH ROTATOR CUFF REPAIR AND OPEN BICEPS TENODESIS Left 02/15/2019   Procedure: SHOULDER ARTHROSCOPY WITH open ROTATOR CUFF REPAIR AND OPEN BICEPS TENODESIS;  Surgeon: Carole Civil, MD;  [facility]: [facility];  Service: Orthopedics;  Laterality: Left;  . SPINE SURGERY  2015   Dr Hal Neer Fusion   . SPINE SURGERY  1999   discectomy  . TOTAL KNEE ARTHROPLASTY Right 02/02/2015   Procedure:  RIGHT TOTAL KNEE ARTHROPLASTY;  Surgeon: Carole Civil, MD;  [facility]: [facility];  Service: Orthopedics;  Laterality: Right;  LM with pt's daughter of new arrival time (10:45)    . TOTAL KNEE ARTHROPLASTY Left 07/24/2016   Procedure:  TOTAL KNEE ARTHROPLASTY;  Surgeon: Carole Civil, MD;  Location: [facility];  Service: Orthopedics;  Laterality: Left;    There were no vitals filed for this visit.   Subjective Assessment - 02/21/20 0824    Subjective COVID-19 screen performed prior to patient entering clinic. Reports pain is minimal and MD was pleased with his ROM but may work on strengthening at home.    Pertinent History Bilateral TKA's, DM, RA, spinal surgery, previous left shoulder surgery.    Patient Stated Goals Use left UE without pain.    Currently in Pain? Yes    Pain Score 4     Pain Location Shoulder    Pain Orientation Left    Pain Descriptors / Indicators Sore    Pain Type Surgical pain    Pain Onset More than a month ago    Pain Frequency Constant              [facility] PT Assessment - 02/21/20 0001      Assessment   Medical Diagnosis S/p left RTC repair.    Referring Provider (PT) Arther Abbott MD.    Onset Date/Surgical Date 11/10/19    Next MD Visit 04/09/2020      Precautions   Precaution Comments Begin with left shoulder PROM.      Observation/Other Assessments   Focus on Therapeutic Outcomes (FOTO)  49%, CK                         [facility] Adult PT Treatment/Exercise - 02/21/20 0001      Shoulder Exercises: Seated   Flexion AROM;Left;20 reps    Other Seated Exercises L shoulder upper cut x20 reps      Shoulder Exercises: Standing   External Rotation Strengthening;Left;20 reps;Theraband    Theraband Level (Shoulder External Rotation) Level 1 (Yellow)    Internal Rotation Strengthening;Left;20 reps;Theraband    Theraband Level (Shoulder Internal Rotation) Level 1 (Yellow)    Extension Strengthening;Left;20 reps;Theraband    Theraband Level (Shoulder Extension) Level 1 (Yellow)    Row Strengthening;Left;20 reps;Theraband    Theraband Level (Shoulder Row) Level 1 (Yellow)      Shoulder Exercises: Pulleys   Flexion 5 minutes      Shoulder Exercises:  ROM/Strengthening   UBE (Upper Arm Bike) 120 RPM's x 8 minutes.    Wall Wash into flexion, CW and CCW circles x20 reps      Modalities   Modalities Vasopneumatic      Vasopneumatic   Number Minutes Vasopneumatic  15 minutes    Vasopnuematic Location  Shoulder    Vasopneumatic Pressure Low    Vasopneumatic Temperature  34 for edema                  PT Education - 02/21/20 4709  Education Details HEP- RW4 with yellow theraband, AROM flexion and upper cut    Person(s) Educated Patient    Methods Explanation;Handout    Comprehension Verbalized understanding               PT Long Term Goals - 02/14/20 0831      PT LONG TERM GOAL #1   Title Independent with a HEP.    Time 8      PT LONG TERM GOAL #3   Title Active ER to 70 degrees+ to allow for easily donning/doffing of apparel.    Baseline 85 degrees.    Time 8    Period Weeks    Status On-going                 Plan - 02/21/20 2683    Clinical Impression Statement Patient presented in clinic with only reports of soreness of L shoulder. Patient stated that he may do strengthening at home per MD progression. Patient guided through therex with education regarding technique for HEP. No complaints of any pain during treatment only of muscle fatigue. Patient instructed to begin AROM flexion and upper cut without weights and then progress to light weight once AROM felt easy for him. Patient provided a new strengthening HEP with yellow theraband along with education for technique and parameters. Patient verbalized understanding of HEP instructions. Normal vasopnuematic response noted following removal of the modaliy.    Personal Factors and Comorbidities Comorbidity 2    Comorbidities RA, DM.    Examination-Activity Limitations Dressing;Reach Overhead;Other    Stability/Clinical Decision Making Stable/Uncomplicated    Rehab Potential Excellent    PT Frequency 3x / week    PT Duration 8 weeks    PT  Treatment/Interventions ADLs/Self Care Home Management;Cryotherapy;Electrical Stimulation;Ultrasound;Moist Heat;Therapeutic activities;Therapeutic exercise;Manual techniques;Patient/family education;Passive range of motion;Vasopneumatic Device;Iontophoresis 60m/ml Dexamethasone    PT Next Visit Plan D/C summary required.    Consulted and Agree with Plan of Care Patient           Patient will benefit from skilled therapeutic intervention in order to improve the following deficits and impairments:  Pain, Decreased activity tolerance, Decreased range of motion  Visit Diagnosis: Chronic left shoulder pain  Stiffness of left shoulder, not elsewhere classified     Problem List Patient Active Problem List   Diagnosis Date Noted  . Chest pain 11/28/2019  . Symptomatic bradycardia 11/28/2019  . Nontraumatic complete tear of left rotator cuff   . Hypertension 08/31/2019  . S/P left rotator cuff repair/ biceps tenodesis 02/15/19  03/01/2019  . Labral tear of long head of left biceps tendon   . Partial tear of left subscapularis tendon   . Traumatic complete tear of left rotator cuff   . Family history of malignant neoplasm of prostate 07/28/2018  . History of Graves' disease 11/11/2017  . S/P total knee replacement, right 02/02/15 08/12/2017  . S/P total knee replacement, left 07/24/16 08/12/2017  . Hypercholesterolemia 11/08/2015  . History of DVT (deep vein thrombosis) 11/04/2015  . [place] of cocaine abuse ([place] 11/04/2015  . History of heroin abuse ([place] 11/04/2015  . Obesity 11/04/2015  . Parasomnia 11/04/2015  . Benign paroxysmal positional vertigo 10/29/2015  . Controlled type 2 diabetes mellitus without complication ([place] 041/96/2229 . Elevated prostate specific antigen (PSA) 10/29/2015  . HX: long term anticoagulant use 10/29/2015  . Sensorineural hearing loss 10/29/2015  . Sleep apnea with use of continuous positive airway pressure (CPAP) 10/29/2015  . Chronic radicular  lumbar pain  10/29/2015  . Arthrofibrosis of total knee arthroplasty ([place])   . Pulmonary embolus ([place]) 01/09/2015  . History of pulmonary embolism 01/09/2015  . UTI (lower urinary tract infection) 02/21/2014  . Other headache syndrome 02/21/2014  . Nausea with vomiting 02/21/2014  . Rheumatoid arthritis ([place]) 02/21/2014  . Nausea & vomiting 02/21/2014  . Headache(784.0) 02/21/2014  . Lumbar spinal stenosis 02/10/2014   Standley Brooking, PTA 02/21/20 12:20 PM   [facility] [place], [place], [address] Phone: [phone]   Fax:  [phone]  Name: LYNDA CAPISTRAN MRN: [id number] Date of Birth: 1960/11/03  PHYSICAL THERAPY DISCHARGE SUMMARY  Visits from Start of Care: 11.  Current functional level related to goals / functional outcomes: See above.   Remaining deficits: Patient with continued left shoulder strength deficits.   Education / Equipment: HEP. Plan: Patient agrees to discharge.  Patient goals were partially met. Patient is being discharged due to the physician's request.  ?????          Mali Applegate MPT

## 2020-02-22 ENCOUNTER — Other Ambulatory Visit: Payer: Self-pay

## 2020-02-22 DIAGNOSIS — G8918 Other acute postprocedural pain: Secondary | ICD-10-CM

## 2020-02-22 MED ORDER — HYDROCODONE-ACETAMINOPHEN 7.5-325 MG PO TABS: 1.0000 | ORAL_TABLET | Freq: Four times a day (QID) | ORAL | 0 refills | Status: DC | PRN

## 2020-02-29 ENCOUNTER — Other Ambulatory Visit: Payer: Self-pay

## 2020-02-29 DIAGNOSIS — G8918 Other acute postprocedural pain: Secondary | ICD-10-CM

## 2020-02-29 MED ORDER — HYDROCODONE-ACETAMINOPHEN 7.5-325 MG PO TABS: 1.0000 | ORAL_TABLET | Freq: Four times a day (QID) | ORAL | 0 refills | Status: DC | PRN

## 2020-03-07 ENCOUNTER — Other Ambulatory Visit: Payer: Self-pay

## 2020-03-07 DIAGNOSIS — G8918 Other acute postprocedural pain: Secondary | ICD-10-CM

## 2020-03-07 MED ORDER — HYDROCODONE-ACETAMINOPHEN 7.5-325 MG PO TABS: 1.0000 | ORAL_TABLET | Freq: Four times a day (QID) | ORAL | 0 refills | Status: DC | PRN

## 2020-03-13 ENCOUNTER — Other Ambulatory Visit: Payer: Self-pay

## 2020-03-13 DIAGNOSIS — G8918 Other acute postprocedural pain: Secondary | ICD-10-CM

## 2020-03-13 MED ORDER — HYDROCODONE-ACETAMINOPHEN 7.5-325 MG PO TABS: 1.0000 | ORAL_TABLET | Freq: Four times a day (QID) | ORAL | 0 refills | Status: DC | PRN

## 2020-03-20 ENCOUNTER — Other Ambulatory Visit: Payer: Self-pay

## 2020-03-20 DIAGNOSIS — G8918 Other acute postprocedural pain: Secondary | ICD-10-CM

## 2020-03-20 MED ORDER — HYDROCODONE-ACETAMINOPHEN 7.5-325 MG PO TABS: 1.0000 | ORAL_TABLET | Freq: Three times a day (TID) | ORAL | 0 refills | Status: DC | PRN

## 2020-03-27 ENCOUNTER — Other Ambulatory Visit: Payer: Self-pay

## 2020-03-27 DIAGNOSIS — G8918 Other acute postprocedural pain: Secondary | ICD-10-CM

## 2020-03-27 MED ORDER — HYDROCODONE-ACETAMINOPHEN 7.5-325 MG PO TABS: 1.0000 | ORAL_TABLET | Freq: Three times a day (TID) | ORAL | 0 refills | Status: AC | PRN

## 2020-04-09 ENCOUNTER — Ambulatory Visit (INDEPENDENT_AMBULATORY_CARE_PROVIDER_SITE_OTHER): Payer: Medicare HMO | Admitting: Orthopedic Surgery

## 2020-04-09 ENCOUNTER — Encounter: Payer: Self-pay | Admitting: Orthopedic Surgery

## 2020-04-09 ENCOUNTER — Other Ambulatory Visit: Payer: Self-pay

## 2020-04-09 VITALS — BP 135/85 | HR 90 | Ht 75.0 in | Wt 270.0 lb

## 2020-04-09 DIAGNOSIS — Z9889 Other specified postprocedural states: Secondary | ICD-10-CM | POA: Diagnosis not present

## 2020-04-09 NOTE — Progress Notes (Signed)
Chief Complaint  Patient presents with  . Routine Post Op    11/10/19 left RCR / biceps tenodesis    59 year old male had a rotator cuff revision repair with biceps tenodesis 5 months ago complains of some anterior soreness but he is regained his range of motion without crepitance  Review of systems notes history of lumbar fusion is complains of mild back pain and also some mild pain in his right shoulder  BP 135/85   Pulse 90   Ht 6\' 3"  (1.905 m)   Wt 270 lb (122.5 kg)   BMI 33.75 kg/m   He has full forward elevation at this time no weakness  Impression  Good progress after rotator cuff revision left shoulder with biceps tenodesis  Continue current management with increase activities as tolerated

## 2020-05-01 ENCOUNTER — Other Ambulatory Visit: Payer: Self-pay | Admitting: Orthopedic Surgery

## 2020-05-01 DIAGNOSIS — S46012A Strain of muscle(s) and tendon(s) of the rotator cuff of left shoulder, initial encounter: Secondary | ICD-10-CM

## 2020-07-17 ENCOUNTER — Other Ambulatory Visit: Payer: Self-pay | Admitting: Orthopedic Surgery

## 2020-07-17 NOTE — Telephone Encounter (Signed)
Patient is requesting pain medicine for him till he can be seen on 08/01/20 Left shoulder has been hurting for 2 weeks and the past 2 days he is unable to lift his arm now.    I asked him if he has re injured it, he said not that he knows of.   [facility]

## 2020-07-17 NOTE — Telephone Encounter (Signed)
He needs to be worked in for the shoulder if unable to move it  Forwarding to Dr Aline Brochure for meds

## 2020-07-18 NOTE — Telephone Encounter (Signed)
Please work in on Thursday

## 2020-07-18 NOTE — Telephone Encounter (Signed)
I called the patient and have him scheduled for 11:00 am tomorrow 07/19/20

## 2020-07-18 NOTE — Telephone Encounter (Signed)
He is worked in Architectural technologist, asking for Pain meds

## 2020-07-18 NOTE — Telephone Encounter (Signed)
Can we do this thurs

## 2020-07-19 ENCOUNTER — Ambulatory Visit: Payer: Medicare HMO | Admitting: Orthopedic Surgery

## 2020-07-19 ENCOUNTER — Ambulatory Visit: Payer: Medicare HMO

## 2020-07-19 ENCOUNTER — Encounter: Payer: Self-pay | Admitting: Orthopedic Surgery

## 2020-07-19 ENCOUNTER — Other Ambulatory Visit: Payer: Self-pay

## 2020-07-19 VITALS — BP 170/96 | HR 68 | Ht 75.0 in | Wt 270.0 lb

## 2020-07-19 DIAGNOSIS — M25512 Pain in left shoulder: Secondary | ICD-10-CM

## 2020-07-19 DIAGNOSIS — M7522 Bicipital tendinitis, left shoulder: Secondary | ICD-10-CM

## 2020-07-19 MED ORDER — HYDROCODONE-ACETAMINOPHEN 7.5-325 MG PO TABS: 1.0000 | ORAL_TABLET | Freq: Four times a day (QID) | ORAL | 0 refills | Status: AC | PRN

## 2020-07-19 MED ORDER — HYDROCODONE-ACETAMINOPHEN 7.5-325 MG PO TABS: 1.0000 | ORAL_TABLET | Freq: Four times a day (QID) | ORAL | 0 refills | Status: DC | PRN

## 2020-07-19 NOTE — Addendum Note (Signed)
Addended by: Arther Abbott E on: 07/19/2020 11:13 AM   Modules accepted: Orders

## 2020-07-19 NOTE — Patient Instructions (Addendum)
Rest the shoulder for 2 weeks : avoid heavy lifting   Apply ice to the front of the shoulder for 20 min 3 times a day   Take ibuprofen 800 mg 3 x a day for the next 2 weeks     Proximal Biceps Tendinitis and Tenosynovitis  The proximal biceps tendon is a strong cord of tissue that connects the biceps muscle on the front of the upper arm to the shoulder blade. Tendinitis is inflammation of a tendon. Tenosynovitis is inflammation of the lining around the tendon (tendon sheath). These conditions often occur at the same time, and they can interfere with the ability to bend the elbow and turn the palm of the hand up. Proximal biceps tendinitis and tenosynovitis are usually caused by overusing the shoulder joint and the biceps muscle. These conditions usually heal within 6 weeks. Proximal biceps tendinitis may include a grade 1 or grade 2 strain of the tendon.  A grade 1 strain is mild, and it involves a slight pull of the tendon without any stretching or noticeable tearing of the tendon. There is usually no loss of biceps muscle strength.  A grade 2 strain is moderate, and it involves a small tear in the tendon. The tendon is stretched, and biceps strength is usually decreased. What are the causes? This condition may be caused by:  A sudden increase in frequency or intensity of activity that involves the shoulder and the biceps muscle.  Overuse of the biceps muscle. This can happen when you do the same movements over and over, such as: ? Turning the palm of the hand up. ? Forceful straightening (hyperextension) of the elbow. ? Bending the elbow.  A direct, forceful hit or injury to the elbow. This is rare. What increases the risk? The following factors may make you more likely to develop this condition:  Playing contact sports.  Playing sports that involve throwing and overhead movements, including racket sports, gymnastics, weight lifting, or bodybuilding.  Doing physical  labor.  Having poor strength and flexibility of the arm and shoulder. What are the signs or symptoms? Symptoms of this condition may include:  Pain and inflammation in the front of the shoulder.  A feeling of warmth in the front of the shoulder.  Limited range of motion of the shoulder and the elbow.  A crackling sound (crepitation) when you move or touch the shoulder or the upper arm. In some cases, symptoms may return after treatment, and they may be long-lasting (chronic). How is this diagnosed? This condition is diagnosed based on:  Your symptoms.  Your medical history.  Physical exam.  X-ray or MRI, if needed. How is this treated? Treatment for this condition depends on the severity of your injury. It may include:  Resting the injured arm.  Icing the injured area.  Doing physical therapy. Your health care provider may also use:  Medicines to treat pain and inflammation.  Sound waves to treat the injured muscle (ultrasound therapy).  Medicines that are injected to the muscle (corticosteroids).  Medicines that numb the area (local anesthetics).  Surgery. This is done if other treatments have not worked. Follow these instructions at home: Managing pain, stiffness, and swelling      If directed, put ice on the injured area. ? Put ice in a plastic bag. ? Place a towel between your skin and the bag. ? Leave the ice on for 20 minutes, 2-3 times a day.  If directed, apply heat to the affected area before  you exercise. Use the heat source that your health care provider recommends, such as a moist heat pack or a heating pad. ? Place a towel between your skin and the heat source. ? Leave the heat on for 20-30 minutes. ? Remove the heat if your skin turns bright red. This is especially important if you are unable to feel pain, heat, or cold. You may have a greater risk of getting burned.  Move your fingers often to reduce stiffness and swelling.  Raise (elevate)  the injured area above the level of your heart while you are lying down. Activity  Do not lift anything that is heavier than 10 lb (4.5 kg), or the limit that you are told, until your health care provider says that it is safe.  Avoid activities that cause pain or make your condition worse.  Return to your normal activities as told by your health care provider. Ask your health care provider what activities are safe for you.  Do exercises as told by your health care provider. General instructions  Take over-the-counter and prescription medicines only as told by your health care provider.  Do not use any products that contain nicotine or tobacco, such as cigarettes, e-cigarettes, and chewing tobacco. These can delay healing. If you need help quitting, ask your health care provider.  Keep all follow-up visits as told by your health care provider. This is important. How is this prevented?  Warm up and stretch before being active.  Cool down and stretch after being active.  Give your body time to rest between periods of activity.  Make sure any equipment that you use is fitted to you.  Be safe and responsible while being active to avoid falls.  Maintain physical fitness, including: ? Strength. ? Flexibility. ? Heart health (cardiovascular fitness). ? The ability to use muscles for a long time (endurance). Contact a health care provider if:  You have symptoms that get worse or do not get better after 2 weeks of treatment.  You develop new symptoms. Get help right away if:  You develop severe pain. Summary  Tendinitis is inflammation of the biceps tendon. Tenosynovitis is inflammation of the lining around the biceps tendon. These conditions often occur at the same time.  These conditions are usually caused by overusing the shoulder joint and biceps muscle.  Symptoms include pain, warmth in the shoulder, and limited range of motion.  The two conditions are treated with rest,  ice, medicines, and surgery (rare). This information is not intended to replace advice given to you by your health care provider. Make sure you discuss any questions you have with your health care provider. Document Revised: 11/30/2018 Document Reviewed: 09/30/2018 Elsevier Patient Education  [employer].

## 2020-07-19 NOTE — Progress Notes (Addendum)
Chief Complaint  Patient presents with  . Shoulder Pain    left increased pain decreased motion  he is s/p repeat RCR 02/15/19   59 year old male had a repeat rotator cuff repair in 2020 presents with acute pain left shoulder indicating he is having problems abducting his arm away from his body and feels like the shoulder is tight  No trauma    Physical Exam Constitutional:      General: He is not in acute distress.    Appearance: He is well-developed.  Cardiovascular:     Comments: No peripheral edema Musculoskeletal:     Comments: Left shoulder  His incision looks fine he has tenderness in the front of the shoulder and the biceps tendon  Drop arm test was normal with no loss of strength he did have some weakness in abduction  Passive range of motion was normal with a negative impingement sign  Skin:    General: Skin is warm and dry.  Neurological:     Mental Status: He is alert and oriented to person, place, and time.     Sensory: No sensory deficit.     Coordination: Coordination normal.     Gait: Gait normal.     Deep Tendon Reflexes: Reflexes are normal and symmetric.    Assessment and plan  Appears to have biceps tendinitis  Recommend rest Ice Ibuprofen Injection  We will see him again in 4 weeks to make sure he is getting better  Meds ordered this encounter  Medications  . DISCONTD: HYDROcodone-acetaminophen (NORCO) 7.5-325 MG tablet    Sig: Take 1 tablet by mouth every 6 (six) hours as needed for up to 5 days for moderate pain.    Dispense:  20 tablet    Refill:  0  . HYDROcodone-acetaminophen (NORCO) 7.5-325 MG tablet    Sig: Take 1 tablet by mouth every 6 (six) hours as needed for up to 5 days for moderate pain.    Dispense:  20 tablet    Refill:  0    . Procedure note biceps tendon injection Left biceps tendon was injected The patient gave verbal consent for cortisone injection Timeout confirmed the site of injection Medications used included  40 mg of Depo-Medrol and 3 mL 1% lidocaine After alcohol and ethyl chloride preparation the point of maximal tenderness was injected over the left  biceps tendon there were no complications Meds ordered this encounter  Medications  . DISCONTD: HYDROcodone-acetaminophen (NORCO) 7.5-325 MG tablet    Sig: Take 1 tablet by mouth every 6 (six) hours as needed for up to 5 days for moderate pain.    Dispense:  20 tablet    Refill:  0  . HYDROcodone-acetaminophen (NORCO) 7.5-325 MG tablet    Sig: Take 1 tablet by mouth every 6 (six) hours as needed for up to 5 days for moderate pain.    Dispense:  20 tablet    Refill:  0

## 2020-07-26 ENCOUNTER — Other Ambulatory Visit: Payer: Self-pay | Admitting: Orthopedic Surgery

## 2020-07-26 MED ORDER — HYDROCODONE-ACETAMINOPHEN 7.5-325 MG PO TABS: 1.0000 | ORAL_TABLET | Freq: Four times a day (QID) | ORAL | 0 refills | Status: DC | PRN

## 2020-07-26 NOTE — Telephone Encounter (Signed)
Patient requests refill on Hydrocodone/Acetaminophen 7.5-325  Mgs.  Qty  20  Sig: Take 1 tablet by mouth every 6 (six) hours as needed for up to 5 days for moderate pain.  Patient uses [facility] in [place]

## 2020-08-01 ENCOUNTER — Ambulatory Visit: Payer: Medicare HMO | Admitting: Orthopedic Surgery

## 2020-08-05 ENCOUNTER — Other Ambulatory Visit: Payer: Self-pay

## 2020-08-06 ENCOUNTER — Other Ambulatory Visit: Payer: Self-pay

## 2020-08-06 MED ORDER — HYDROCODONE-ACETAMINOPHEN 7.5-325 MG PO TABS: 1.0000 | ORAL_TABLET | Freq: Four times a day (QID) | ORAL | 0 refills | Status: DC | PRN

## 2020-08-13 ENCOUNTER — Ambulatory Visit: Payer: Medicare HMO | Admitting: Orthopedic Surgery

## 2020-08-13 ENCOUNTER — Other Ambulatory Visit: Payer: Self-pay

## 2020-08-13 VITALS — Ht 75.0 in | Wt 270.0 lb

## 2020-08-13 DIAGNOSIS — G8929 Other chronic pain: Secondary | ICD-10-CM | POA: Diagnosis not present

## 2020-08-13 DIAGNOSIS — M25512 Pain in left shoulder: Secondary | ICD-10-CM

## 2020-08-13 DIAGNOSIS — Z9889 Other specified postprocedural states: Secondary | ICD-10-CM

## 2020-08-13 MED ORDER — HYDROCODONE-ACETAMINOPHEN 7.5-325 MG PO TABS: 1.0000 | ORAL_TABLET | Freq: Four times a day (QID) | ORAL | 0 refills | Status: DC | PRN

## 2020-08-13 NOTE — Progress Notes (Signed)
Chief Complaint  Patient presents with  . Follow-up    Recheck on left shoulder, DOS 02-15-19.   Meds: ibuprofen, norco 7.5, methotrexate, prednosone  H/o diabetes    59 year old male had a rotator cuff repair for the second time in June, presented with acute pain of the left shoulder on December 2  He was treated with home exercise program hydrocodone ibuprofen he also takes methotrexate and prednisone presents after biceps tendon injection with no improvement.  He is complains that he cannot hold the steering wheel has pain with supination of the left arm pain runs down the front of the shoulder into the left elbow and has weakness on abduction  Exam of the left shoulder  Surgical scar is healed with no signs of neuroma  He is tender over the front of the deltoid and lateral deltoid with increased pain with abduction which he can do up to 80 degrees, with passive motion of 100 degrees  His flexion actively is 100 passively is 150  He has pain with supination and resistance  X-ray was negative on the initial presentation for acute process Impression Prior hardware is intact suture anchors Mild proximal migration of the humerus in relation to the glenoid suggest chronic rotator cuff deficiency AC joint arthritis Type III acromion  Recommend MRI with contrast in the joint to diagnose rotator cuff tear  Encounter Diagnoses  Name Primary?  . S/P left rotator cuff repair   . Acute pain of left shoulder   . Chronic pain in left shoulder Yes    Meds ordered this encounter  Medications  . HYDROcodone-acetaminophen (NORCO) 7.5-325 MG tablet    Sig: Take 1 tablet by mouth every 6 (six) hours as needed for moderate pain.    Dispense:  20 tablet    Refill:  0

## 2020-08-13 NOTE — Addendum Note (Signed)
Addended by: Cherre Huger E on: 08/13/2020 09:36 AM   Modules accepted: Orders

## 2020-08-13 NOTE — Addendum Note (Signed)
Addended by: Baird Kay on: 08/13/2020 09:59 AM   Modules accepted: Orders

## 2020-08-13 NOTE — Patient Instructions (Signed)
Use heat   Ibuprofen   norco    We'll order mri of the shoulder w IA contrast

## 2020-08-16 ENCOUNTER — Ambulatory Visit: Payer: Medicare HMO | Admitting: Orthopedic Surgery

## 2020-08-20 ENCOUNTER — Other Ambulatory Visit: Payer: Self-pay | Admitting: Orthopedic Surgery

## 2020-08-20 ENCOUNTER — Other Ambulatory Visit: Payer: Self-pay

## 2020-08-20 DIAGNOSIS — G8929 Other chronic pain: Secondary | ICD-10-CM

## 2020-08-20 DIAGNOSIS — M25512 Pain in left shoulder: Secondary | ICD-10-CM

## 2020-08-20 MED ORDER — HYDROCODONE-ACETAMINOPHEN 5-325 MG PO TABS: 1.0000 | ORAL_TABLET | Freq: Four times a day (QID) | ORAL | 0 refills | Status: DC | PRN

## 2020-08-20 NOTE — Progress Notes (Signed)
Meds ordered this encounter  Medications  . HYDROcodone-acetaminophen (NORCO/VICODIN) 5-325 MG tablet    Sig: Take 1 tablet by mouth every 6 (six) hours as needed for up to 5 days for moderate pain.    Dispense:  20 tablet    Refill:  0    Opioid judicious managenment

## 2020-08-24 ENCOUNTER — Encounter (HOSPITAL_COMMUNITY): Payer: Self-pay

## 2020-08-24 ENCOUNTER — Ambulatory Visit (HOSPITAL_COMMUNITY)
Admission: RE | Admit: 2020-08-24 | Discharge: 2020-08-24 | Disposition: A | Discharge status: Home / Self Care | Payer: Medicare HMO | Source: Ambulatory Visit | Attending: Orthopedic Surgery | Admitting: Orthopedic Surgery

## 2020-08-24 ENCOUNTER — Other Ambulatory Visit: Payer: Self-pay

## 2020-08-24 ENCOUNTER — Other Ambulatory Visit: Payer: Self-pay | Admitting: Orthopedic Surgery

## 2020-08-24 DIAGNOSIS — G8929 Other chronic pain: Secondary | ICD-10-CM | POA: Insufficient documentation

## 2020-08-24 DIAGNOSIS — S43432A Superior glenoid labrum lesion of left shoulder, initial encounter: Secondary | ICD-10-CM | POA: Diagnosis not present

## 2020-08-24 DIAGNOSIS — M75122 Complete rotator cuff tear or rupture of left shoulder, not specified as traumatic: Secondary | ICD-10-CM | POA: Diagnosis not present

## 2020-08-24 DIAGNOSIS — M25512 Pain in left shoulder: Secondary | ICD-10-CM | POA: Insufficient documentation

## 2020-08-24 DIAGNOSIS — Z9889 Other specified postprocedural states: Secondary | ICD-10-CM | POA: Diagnosis present

## 2020-08-24 MED ORDER — POVIDONE-IODINE 10 % EX SOLN
Freq: Once | CUTANEOUS | Status: AC
  Administered 2020-08-24: 1 via TOPICAL

## 2020-08-24 MED ORDER — IOHEXOL 180 MG/ML  SOLN
20.0000 mL | Freq: Once | INTRAMUSCULAR | Status: AC | PRN
  Administered 2020-08-24: 15 mL via INTRA_ARTICULAR

## 2020-08-24 MED ORDER — LIDOCAINE HCL (PF) 1 % IJ SOLN
INTRAMUSCULAR | Status: AC
  Filled 2020-08-24: qty 5

## 2020-08-24 MED ORDER — SODIUM CHLORIDE (PF) 0.9 % IJ SOLN
INTRAMUSCULAR | Status: AC
  Filled 2020-08-24: qty 10

## 2020-08-24 MED ORDER — SODIUM CHLORIDE (PF) 0.9 % IJ SOLN
INTRAMUSCULAR | Status: AC
  Administered 2020-08-24: 5 mL
  Filled 2020-08-24: qty 10

## 2020-08-24 MED ORDER — LIDOCAINE HCL (PF) 1 % IJ SOLN
5.0000 mL | Freq: Once | INTRAMUSCULAR | Status: AC
  Administered 2020-08-24: 3 mL

## 2020-08-24 MED ORDER — GADOBUTROL 1 MMOL/ML IV SOLN
2.0000 mL | Freq: Once | INTRAVENOUS | Status: AC | PRN
  Administered 2020-08-24: 0.05 mL

## 2020-08-24 MED ORDER — POVIDONE-IODINE 10 % EX SOLN
CUTANEOUS | Status: AC
  Filled 2020-08-24: qty 15

## 2020-08-24 NOTE — Procedures (Signed)
Preprocedure Dx: Chronic left shoulder pain Postprocedure Dx: Chronic left shoulder pain Procedure  Fluoroscopically guided LEFT joint injection for MR arthrogrpahy Radiologist:  Thornton Papas Anesthesia:  3 ml of 1% Injectate:  10 ml of standard MR arthrogram solution Fluoro time:  0 minutes 42 seconds EBL:   None Complications: None

## 2020-08-26 ENCOUNTER — Other Ambulatory Visit: Payer: Self-pay

## 2020-08-26 DIAGNOSIS — G8929 Other chronic pain: Secondary | ICD-10-CM

## 2020-08-27 ENCOUNTER — Other Ambulatory Visit: Payer: Self-pay | Admitting: Orthopedic Surgery

## 2020-08-27 MED ORDER — HYDROCODONE-ACETAMINOPHEN 5-325 MG PO TABS: 1.0000 | ORAL_TABLET | Freq: Four times a day (QID) | ORAL | 0 refills | Status: DC | PRN

## 2020-08-30 ENCOUNTER — Other Ambulatory Visit: Payer: Self-pay

## 2020-08-30 ENCOUNTER — Ambulatory Visit (INDEPENDENT_AMBULATORY_CARE_PROVIDER_SITE_OTHER): Payer: Medicare HMO | Admitting: Orthopedic Surgery

## 2020-08-30 VITALS — BP 165/97 | HR 85 | Ht 75.0 in | Wt 270.0 lb

## 2020-08-30 DIAGNOSIS — K5904 Chronic idiopathic constipation: Secondary | ICD-10-CM | POA: Insufficient documentation

## 2020-08-30 DIAGNOSIS — Z860100 Personal history of colon polyps, unspecified: Secondary | ICD-10-CM | POA: Insufficient documentation

## 2020-08-30 DIAGNOSIS — S46012A Strain of muscle(s) and tendon(s) of the rotator cuff of left shoulder, initial encounter: Secondary | ICD-10-CM | POA: Diagnosis not present

## 2020-08-30 DIAGNOSIS — Z9889 Other specified postprocedural states: Secondary | ICD-10-CM | POA: Diagnosis not present

## 2020-08-30 DIAGNOSIS — G8929 Other chronic pain: Secondary | ICD-10-CM | POA: Diagnosis not present

## 2020-08-30 DIAGNOSIS — R195 Other fecal abnormalities: Secondary | ICD-10-CM | POA: Insufficient documentation

## 2020-08-30 DIAGNOSIS — Z1211 Encounter for screening for malignant neoplasm of colon: Secondary | ICD-10-CM | POA: Insufficient documentation

## 2020-08-30 DIAGNOSIS — M25512 Pain in left shoulder: Secondary | ICD-10-CM

## 2020-08-30 DIAGNOSIS — Z800 Family history of malignant neoplasm of digestive organs: Secondary | ICD-10-CM | POA: Insufficient documentation

## 2020-08-30 MED ORDER — HYDROCODONE-ACETAMINOPHEN 7.5-325 MG PO TABS: 1.0000 | ORAL_TABLET | Freq: Four times a day (QID) | ORAL | 0 refills | Status: DC | PRN

## 2020-08-30 NOTE — Patient Instructions (Addendum)
See Dr Amedeo Kinsman for reverse shoulder  Reverse Total Shoulder Replacement Reverse total shoulder replacement is a surgery to replace the shoulder joint. You may need this surgery if your rotator cuff is torn and cannot be repaired. The rotator cuff is a group of muscles and strong tissues that connect muscle to bone (tendons) in the shoulder joint. The rotator cuff helps you lift your arm. You may also need a reverse total shoulder replacement if you have:  A previously unsuccessful normal shoulder replacement.  Severe pain that keeps you from lifting your arm.  A severe fracture of your shoulder joint.  Repeated dislocations of your shoulder joint.  A tumor in your shoulder joint. The shoulder is a ball-and-socket joint. The top of the upper arm bone (humerus) is shaped like a ball, and it fits into the socket of the shoulder blade (scapula). During a normal shoulder replacement, a plastic cup replaces the socket, and a metal ball replaces the ball of the humerus. This allows the rotator cuff to lift the arm, like it normally does. During a reverse total shoulder replacement, the plastic socket is placed into the top of the humerus, and the metal ball is placed into the shoulder socket. This means that the positions of the ball and socket are reversed. This lets you use other shoulder muscles to lift your arm, instead of using the rotator cuff. Tell a health care provider about:  Any allergies you have.  All medicines you are taking, including vitamins, herbs, eye drops, creams, and over-the-counter medicines.  Any problems you or family members have had with anesthetic medicines.  Any blood disorders you have.  Any surgeries you have had.  Any medical conditions you have.  Whether you are pregnant or may be pregnant. What are the risks? Generally, this is a safe procedure. However, problems may occur, including:  Infection.  Bleeding.  Blood clots.  Allergic reactions to  medicines.  Damage to nearby structures or organs, such as blood vessels or nerves.  Problems with the shoulder, such as: ? Loosening of the new shoulder parts over time, which may require replacement. ? Poor return of shoulder movement. ? Shoulder pain. ? The ball and socket separating (dislocation). What happens before the procedure? Staying hydrated Follow instructions from your health care provider about hydration, which may include:  Up to 2 hours before the procedure - you may continue to drink clear liquids, such as water, clear fruit juice, black coffee, and plain tea.   Eating and drinking restrictions Follow instructions from your health care provider about eating and drinking, which may include:  8 hours before the procedure - stop eating heavy meals or foods, such as meat, fried foods, or fatty foods.  6 hours before the procedure - stop eating light meals or foods, such as toast or cereal.  6 hours before the procedure - stop drinking milk or drinks that contain milk.  2 hours before the procedure - stop drinking clear liquids. Medicines Ask your health care provider about:  Changing or stopping your regular medicines. This is especially important if you are taking diabetes medicines or blood thinners.  Taking medicines such as aspirin and ibuprofen. These medicines can thin your blood. Do not take these medicines unless your health care provider tells you to take them.  Taking over-the-counter medicines, vitamins, herbs, and supplements. Tests You may have tests, such as:  Blood tests.  Chest X-rays.  Heart tests. General instructions  Do not use any products that  contain nicotine or tobacco for at least 4 weeks before the procedure. These products include cigarettes, e-cigarettes, and chewing tobacco. If you need help quitting, ask your health care provider.  Keep your body and teeth clean. Germs from anywhere in your body can travel to your new joint and  infect it. Tell your health care provider if you: ? Plan to have dental care and routine cleanings. ? Develop any skin infections.  Ask your health care provider: ? How your surgery site will be marked. ? What steps will be taken to help prevent infection. These steps may include:  Removing hair at the surgery site.  Washing skin with a germ-killing soap.  Receiving antibiotic medicine.  Plan to have a responsible adult take you home from the hospital or clinic.  Plan to have someone help around the house for a few weeks after your procedure. What happens during the procedure?  An IV will be inserted into one of your veins.  You will be given one or more of the following: ? A medicine to help you relax (sedative). ? A medicine to numb the area (local anesthetic). ? A medicine to make you fall asleep (general anesthetic). ? A medicine that is injected into an area of your body to numb everything below the injection site (regional anesthetic).  An incision will be made in the front or the top of your shoulder.  Your shoulder joint will be opened and cleaned, and the ball of the humerus will be removed from the socket.  A metal ball will be screwed into your scapula.  The plastic socket will be inserted into the top of your humerus and held in place.  The plastic socket will be positioned onto the metal ball and fixed into place.  Your incision will be closed with stitches (sutures) or staples.  Your incision will be covered with a bandage (dressing) or other wound covering.  Your arm will be put in a sling. This will keep your arm still while it heals. The procedure may vary among health care providers and hospitals. What happens after the procedure?  Your blood pressure, heart rate, breathing rate, and blood oxygen level will be monitored until you leave the hospital or clinic.  You may continue to receive fluids and medicines, such as pain medicines or antibiotics,  through an IV.  You will be shown exercises to do at home to improve movement and strength in your shoulder (physical therapy).  If you were given a sedative during the procedure, it can affect you for several hours. Do not drive or operate machinery until your health care provider says that it is safe. Summary  A reverse total shoulder replacement may be done if you have shoulder pain due to a rotator cuff tear that cannot be repaired, a severe fracture or repeated dislocations, or a previous unsuccessful shoulder replacement.  Follow instructions from your health care provider about eating and drinking before the surgery.  Plan to have someone take you home and help around the house for a few weeks after your procedure.  You will be shown exercises to do at home to improve movement and strength in your shoulder (physical therapy). This information is not intended to replace advice given to you by your health care provider. Make sure you discuss any questions you have with your health care provider. Document Revised: 01/18/2020 Document Reviewed: 01/18/2020 Elsevier Patient Education  2021 [employer].

## 2020-08-30 NOTE — Progress Notes (Signed)
Chief Complaint  Patient presents with  . Shoulder Pain    Left/ review MRI     60 year old male had 2 rotator cuff repairs the most recent March 2021, initial surgery was June 2020  [name] noted recurrent pain decreased range of motion and weakness in his left shoulder and after careful evaluation he had an MRI done August 24, 2020  Surgery March 2021 PRE-OPERATIVE DIAGNOSIS:  Adhesions left shoulder  POST-OPERATIVE DIAGNOSIS:  re-tear of left rotator cuff  PROCEDURE:  Procedure(s) with comments: SHOULDER ARTHROSCOPY WITH OPEN ROTATOR CUFF REPAIR   Findings  the patient had a rerupture of his previous repair of the cuff , the cuff pulled through the sutures.  The previous sutures were removed at surgery.  The rotator cuff tendinous portion was externally rotated from the anterior portion of the cuff making this a 2 tendon type tear involving the posterior portion of the supraspinatus and the infraspinatus.  Repair was performed with margin convergence using suture tape from [employer] and then a corkscrew anchor with 1 suture anteriorly and one posteriorly to capture the remaining portions of the cuff there was a small 5 mm place in the cuff that was not covering the humeral head and then the push lock anchor was used to anchor the suture ends to the humerus  Sterile dressing was applied patient was extubated placed in a sling and taken to recovery room in stable condition  MRI August 24, 2020  After looking at his images see high riding head complete tear of his rotator cuff and agree with the report listed below  IMPRESSION: 1. Interval postsurgical changes of rotator cuff repair with recurrent full-thickness, full-width tear of the supraspinatus tendon with retraction to the level of the humeral head apex. 2. Infraspinatus and subscapularis tendinosis with subtle articular surface fraying/irregularity of the infraspinatus tendon. 3. Labral tears involving the posterior and  inferior labrum. 4. Mild thickening of the inferior glenohumeral ligament, which may be postsurgical or reflect capsulitis. 5. Moderate AC joint arthropathy.   Electronically Signed   By: Davina Poke D.O.   On: 08/24/2020 11:31  Assessment and plan At this point I think the patient is looking at a reverse shoulder replacement.  He has diabetes and rheumatoid arthritis and I think his tissue is just not going to hold up to repair he is on prednisone and methotrexate and this of course is contributing to his inability to heal these repairs  He would like to go ahead and see Dr. Amedeo Kinsman for possible reverse replacement  He says he is in a lot of pain and he has lost function of the shoulder  Meds ordered this encounter  Medications  . HYDROcodone-acetaminophen (NORCO) 7.5-325 MG tablet    Sig: Take 1 tablet by mouth every 6 (six) hours as needed for moderate pain.    Dispense:  20 tablet    Refill:  0

## 2020-09-06 ENCOUNTER — Other Ambulatory Visit: Payer: Self-pay

## 2020-09-06 DIAGNOSIS — G8929 Other chronic pain: Secondary | ICD-10-CM

## 2020-09-06 DIAGNOSIS — M25512 Pain in left shoulder: Secondary | ICD-10-CM

## 2020-09-07 ENCOUNTER — Encounter: Payer: Self-pay | Admitting: Orthopedic Surgery

## 2020-09-07 ENCOUNTER — Other Ambulatory Visit: Payer: Self-pay

## 2020-09-07 ENCOUNTER — Ambulatory Visit (INDEPENDENT_AMBULATORY_CARE_PROVIDER_SITE_OTHER): Payer: Medicare HMO | Admitting: Orthopedic Surgery

## 2020-09-07 VITALS — BP 143/94 | HR 62 | Ht 75.5 in | Wt 274.0 lb

## 2020-09-07 DIAGNOSIS — M75102 Unspecified rotator cuff tear or rupture of left shoulder, not specified as traumatic: Secondary | ICD-10-CM

## 2020-09-07 DIAGNOSIS — M12812 Other specific arthropathies, not elsewhere classified, left shoulder: Secondary | ICD-10-CM

## 2020-09-07 MED ORDER — HYDROCODONE-ACETAMINOPHEN 5-325 MG PO TABS: 1.0000 | ORAL_TABLET | Freq: Four times a day (QID) | ORAL | 0 refills | Status: AC | PRN

## 2020-09-07 NOTE — Progress Notes (Signed)
New Patient Visit  Assessment: Willie Howe is a 60 y.o. male with the following: Left shoulder rotator cuff arthropathy; previous failed rotator cuff repair x 2   Plan: Willie Howe has longstanding left shoulder pain and dysfunction.  He has previously underwent rotator cuff repair, and both times these were unsuccessful.  Repeat MRI demonstrated the rotator cuff tendons were torn and retracted, as well as some muscular atrophy.  He has demonstrated pseudoparalysis on physical exam, and also has pain requiring the use of narcotics.  Based on all this, I feel he is an excellent candidate for a reverse shoulder arthroplasty.  The procedure and recovery was discussed with the patient today.  All questions were answered.  He is interested in pursuing surgery.  Risks and benefits of the procedure, including, but not limited to infection, bleeding, dislocation, need for further surgery, damage to surrounding structures, persistent pain and stiffness, and more severe complications associated with anesthesia were discussed.  The patient has rheumatoid arthritis, and is currently taking methotrexate.  He is also diabetic, but states his A1c is below 7.  Due to his medical comorbidities, he will require medical clearance prior to surgery.  Once this has been obtained, we will finalize a date and schedule a CT scan prior to surgery.  All questions were answered, and he is amenable to this plan.   Follow-up: Return for After medical clearance for OR.  Subjective:  Chief Complaint  Patient presents with  . Shoulder Pain    Left shld pain    History of Present Illness: Willie Howe is a 60 y.o. RHD male who presents for evaluation of his left shoulder.  Briefly, he is previously underwent left rotator cuff repair with Dr. Aline Brochure, twice.  He did not heal following either surgery.  A recent MRI demonstrated a full-thickness rotator cuff tear with retraction.  As a result, he seeks further options for his  persistent pain and dysfunction.  He has difficulty moving his left shoulder.  He also notes a deep, aching pain within his left shoulder.  He has been taking hydrocodone recently for his pain.  In addition, he will occasionally take ibuprofen.  He has had injections in the past, but these are no longer providing sustained relief.  Of note, he does have rheumatoid arthritis and is currently taking methotrexate.   Review of Systems: No fevers or chills No numbness or tingling No chest pain No shortness of breath No bowel or bladder dysfunction No GI distress No headaches   Medical History:  Past Medical History:  Diagnosis Date  . BPPV (benign paroxysmal positional vertigo)   . DVT (deep venous thrombosis) ([place])   . Graves disease 1996  . History of kidney stones   . History of pulmonary embolism   . Obesity   . Rheumatoid arthritis ([place])   . Sleep apnea    Intolerant of CPAP  . Type 2 diabetes mellitus ([place])     Past Surgical History:  Procedure Laterality Date  . BACK SURGERY  2015  . COLONOSCOPY    . EXAM UNDER ANESTHESIA WITH MANIPULATION OF KNEE Right 04/18/2015   Procedure: MANIPULATION OF RIGHT KNEE UNDER ANESTHESIA;  Surgeon: Carole Civil, MD;  Location: [facility];  Service: Orthopedics;  Laterality: Right;  . IVC Filter     removed December 2016  . [place]  2010  . KNEE ARTHROSCOPY Right 2006   meniscus   . KNEE ARTHROSCOPY WITH MEDIAL MENISECTOMY  Left 04/24/2016   Procedure: LEFT KNEE ARTHROSCOPY WITH PARTIAL MEDIAL MENISECTOMY;  Surgeon: Vickki Hearing, MD;  Location: [facility];  Service: Orthopedics;  Laterality: Left;  . KNEE SURGERY     x 2  . KNEE SURGERY Left 1976   ?ligament repair   . LEFT HEART CATH AND CORONARY ANGIOGRAPHY N/A 11/29/2019   Procedure: LEFT HEART CATH AND CORONARY ANGIOGRAPHY;  Surgeon: [place], Peter M, MD;  Location: [facility] INVASIVE CV LAB;  Service: Cardiovascular;  Laterality: N/A;  . LUMBAR WOUND DEBRIDEMENT N/A 02/23/2014    Procedure: LUMBAR WOUND DEBRIDEMENT;  Surgeon: Reinaldo Meeker, MD;  Location: [facility] OR;  Service: Neurosurgery;  Laterality: N/A;  exploration lumbar wound. repair of dural defect.  Marland Kitchen MENISECTOMY Right    open medial   . NEPHROLITHOTOMY    . PERIPHERAL VASCULAR CATHETERIZATION N/A 01/30/2015   Procedure: IVC Filter Insertion;  Surgeon: Nada Libman, MD;  Location: [facility] INVASIVE CV LAB;  Service: Cardiovascular;  Laterality: N/A;  . PERIPHERAL VASCULAR CATHETERIZATION N/A 04/24/2015   Procedure: IVC Filter Removal;  Surgeon: Nada Libman, MD;  Location: [facility] INVASIVE CV LAB;  Service: Cardiovascular;  Laterality: N/A;  . PERIPHERAL VASCULAR CATHETERIZATION N/A 08/14/2015   Procedure: IVC Filter Removal;  Surgeon: Nada Libman, MD;  Location: [facility] INVASIVE CV LAB;  Service: Cardiovascular;  Laterality: N/A;  . SHOULDER ARTHROSCOPY WITH OPEN ROTATOR CUFF REPAIR Left 11/10/2019   Procedure: SHOULDER ARTHROSCOPY WITH OPEN ROTATOR CUFF REPAIR;  Surgeon: Vickki Hearing, MD;  Location: [facility];  Service: Orthopedics;  Laterality: Left;  pt tested + on 2/23, does not need Covid test < 90 days  . SHOULDER ARTHROSCOPY WITH ROTATOR CUFF REPAIR AND OPEN BICEPS TENODESIS Left 02/15/2019   Procedure: SHOULDER ARTHROSCOPY WITH open ROTATOR CUFF REPAIR AND OPEN BICEPS TENODESIS;  Surgeon: Vickki Hearing, MD;  Location: [facility];  Service: Orthopedics;  Laterality: Left;  . SPINE SURGERY  2015   Dr Gerlene Fee Fusion   . SPINE SURGERY  1999   discectomy  . TOTAL KNEE ARTHROPLASTY Right 02/02/2015   Procedure:  RIGHT TOTAL KNEE ARTHROPLASTY;  Surgeon: Vickki Hearing, MD;  Location: [facility];  Service: Orthopedics;  Laterality: Right;  LM with pt's daughter of new arrival time (10:45)    . TOTAL KNEE ARTHROPLASTY Left 07/24/2016   Procedure: TOTAL KNEE ARTHROPLASTY;  Surgeon: Vickki Hearing, MD;  Location: [facility];  Service: Orthopedics;  Laterality: Left;    Family History  Problem Relation Age of Onset   . Deep vein thrombosis Mother   . Hypertension Mother   . Breast cancer Mother   . Hypertension Father   . Prostate cancer Father   . CAD Sister    Social History   Tobacco Use  . Smoking status: Former Smoker    Packs/day: 1.50    Years: 20.00    Pack years: 30.00    Types: Cigarettes    Quit date: 02/03/1995    Years since quitting: 25.6  . Smokeless tobacco: Never Used  Vaping Use  . Vaping Use: Never used  Substance Use Topics  . Alcohol use: No    Alcohol/week: 0.0 standard drinks  . Drug use: No    Allergies  Allergen Reactions  . Statins Other (See Comments)    Muscle pain  . Lisinopril Cough    Current Meds  Medication Sig  . aspirin EC 81 MG tablet Take 81 mg by mouth daily.  . cetirizine (ZYRTEC) 10 MG tablet  Take 10 mg by mouth daily.  Marland Kitchen docusate sodium (COLACE) 100 MG capsule Take 100 mg by mouth every other day. At night  . folic acid (FOLVITE) 1 MG tablet Take 1 mg by mouth daily.  Marland Kitchen HYDROcodone-acetaminophen (NORCO) 7.5-325 MG tablet Take 1 tablet by mouth every 6 (six) hours as needed for moderate pain.  Marland Kitchen HYDROcodone-acetaminophen (NORCO/VICODIN) 5-325 MG tablet Take 1 tablet by mouth every 6 (six) hours as needed for up to 5 days for moderate pain.  Marland Kitchen inFLIXimab-abda (RENFLEXIS IV) Inject 1 Dose into the vein every 6 (six) weeks.  . metFORMIN (GLUCOPHAGE-XR) 500 MG 24 hr tablet Take 1 tablet by mouth 2 (two) times daily.  . methotrexate 50 MG/2ML injection Inject 200 mg into the muscle every Friday.   . predniSONE (DELTASONE) 5 MG tablet Take 5 mg by mouth daily.  . promethazine (PHENERGAN) 12.5 MG tablet Take 1 tablet (12.5 mg total) by mouth every 6 (six) hours as needed for nausea or vomiting.  . tamsulosin (FLOMAX) 0.4 MG CAPS capsule Take 0.4 mg by mouth at bedtime.     Objective: BP (!) 143/94   Pulse 62   Ht 6' 3.5" (1.918 m)   Wt 274 lb (124.3 kg)   BMI 33.80 kg/m   Physical Exam:  General: Alert and oriented, no acute  distress Gait: Normal  Evaluation left shoulder demonstrates a well-healed anterior lateral surgical incision, without surrounding erythema or drainage.  No tenderness to palpation.  No deformity is appreciated.  Abduction limited to 70 degrees at his side.  Forward flexion limited to 85 degrees.  Internal rotation to his back pocket.  Positive drop arm test.  4/5 subscapularis.    IMAGING: I personally reviewed images previously obtained in clinic   MRI of the left shoulder demonstrate sequelae of previous rotator cuff repairs.  Superior rotator cuff tendons are torn and retracted to the level of the glenoid.  There is atrophy of the supraspinatus.   New Medications:  Meds ordered this encounter  Medications  . HYDROcodone-acetaminophen (NORCO/VICODIN) 5-325 MG tablet    Sig: Take 1 tablet by mouth every 6 (six) hours as needed for up to 5 days for moderate pain.    Dispense:  20 tablet    Refill:  0      Mordecai Rasmussen, MD  09/07/2020 4:16 PM

## 2020-09-10 MED ORDER — HYDROCODONE-ACETAMINOPHEN 7.5-325 MG PO TABS: 1.0000 | ORAL_TABLET | Freq: Four times a day (QID) | ORAL | 0 refills | Status: DC | PRN

## 2020-09-13 DIAGNOSIS — E6609 Other obesity due to excess calories: Secondary | ICD-10-CM | POA: Diagnosis not present

## 2020-09-13 DIAGNOSIS — R69 Illness, unspecified: Secondary | ICD-10-CM | POA: Diagnosis not present

## 2020-09-13 DIAGNOSIS — E119 Type 2 diabetes mellitus without complications: Secondary | ICD-10-CM | POA: Diagnosis not present

## 2020-09-13 DIAGNOSIS — Z86711 Personal history of pulmonary embolism: Secondary | ICD-10-CM | POA: Diagnosis not present

## 2020-09-13 DIAGNOSIS — Z23 Encounter for immunization: Secondary | ICD-10-CM | POA: Diagnosis not present

## 2020-09-13 DIAGNOSIS — R001 Bradycardia, unspecified: Secondary | ICD-10-CM | POA: Diagnosis not present

## 2020-09-13 DIAGNOSIS — Z01818 Encounter for other preprocedural examination: Secondary | ICD-10-CM | POA: Diagnosis not present

## 2020-09-13 DIAGNOSIS — G4730 Sleep apnea, unspecified: Secondary | ICD-10-CM | POA: Diagnosis not present

## 2020-09-13 DIAGNOSIS — Z0000 Encounter for general adult medical examination without abnormal findings: Secondary | ICD-10-CM | POA: Diagnosis not present

## 2020-09-13 DIAGNOSIS — Z6834 Body mass index (BMI) 34.0-34.9, adult: Secondary | ICD-10-CM | POA: Diagnosis not present

## 2020-09-13 DIAGNOSIS — I10 Essential (primary) hypertension: Secondary | ICD-10-CM | POA: Diagnosis not present

## 2020-09-17 ENCOUNTER — Encounter: Payer: Self-pay | Admitting: Orthopedic Surgery

## 2020-09-17 NOTE — Progress Notes (Signed)
I am faxing letter for surgical clearance.

## 2020-09-20 ENCOUNTER — Other Ambulatory Visit: Payer: Self-pay

## 2020-09-20 DIAGNOSIS — M25512 Pain in left shoulder: Secondary | ICD-10-CM

## 2020-09-20 DIAGNOSIS — M0589 Other rheumatoid arthritis with rheumatoid factor of multiple sites: Secondary | ICD-10-CM | POA: Diagnosis not present

## 2020-09-20 DIAGNOSIS — G8929 Other chronic pain: Secondary | ICD-10-CM

## 2020-09-20 DIAGNOSIS — Z79899 Other long term (current) drug therapy: Secondary | ICD-10-CM | POA: Diagnosis not present

## 2020-09-20 MED ORDER — HYDROCODONE-ACETAMINOPHEN 7.5-325 MG PO TABS: 1.0000 | ORAL_TABLET | Freq: Four times a day (QID) | ORAL | 0 refills | Status: DC | PRN

## 2020-09-24 ENCOUNTER — Telehealth: Payer: Self-pay | Admitting: Orthopedic Surgery

## 2020-09-24 NOTE — Telephone Encounter (Signed)
Dr. Amedeo Kinsman received the surgical clearance for this patient and he is agreeable to go ahead once we get the approval to go ahead with his shoulder surgery. No other concerns at this time.

## 2020-09-26 ENCOUNTER — Other Ambulatory Visit: Payer: Self-pay

## 2020-09-26 DIAGNOSIS — G8929 Other chronic pain: Secondary | ICD-10-CM

## 2020-09-26 MED ORDER — HYDROCODONE-ACETAMINOPHEN 7.5-325 MG PO TABS
1.0000 | ORAL_TABLET | Freq: Four times a day (QID) | ORAL | 0 refills | Status: DC | PRN
Start: 2020-09-26 — End: 2020-10-02

## 2020-10-02 ENCOUNTER — Other Ambulatory Visit: Payer: Self-pay

## 2020-10-02 DIAGNOSIS — M25512 Pain in left shoulder: Secondary | ICD-10-CM

## 2020-10-02 DIAGNOSIS — G8929 Other chronic pain: Secondary | ICD-10-CM

## 2020-10-02 MED ORDER — HYDROCODONE-ACETAMINOPHEN 7.5-325 MG PO TABS
1.0000 | ORAL_TABLET | Freq: Four times a day (QID) | ORAL | 0 refills | Status: DC | PRN
Start: 2020-10-02 — End: 2020-10-09

## 2020-10-09 ENCOUNTER — Other Ambulatory Visit: Payer: Self-pay

## 2020-10-09 DIAGNOSIS — G8929 Other chronic pain: Secondary | ICD-10-CM

## 2020-10-10 ENCOUNTER — Other Ambulatory Visit: Payer: Self-pay

## 2020-10-10 ENCOUNTER — Encounter: Payer: Self-pay | Admitting: Orthopedic Surgery

## 2020-10-10 ENCOUNTER — Other Ambulatory Visit: Payer: Self-pay | Admitting: Orthopedic Surgery

## 2020-10-10 ENCOUNTER — Ambulatory Visit (INDEPENDENT_AMBULATORY_CARE_PROVIDER_SITE_OTHER): Payer: Medicare HMO | Admitting: Orthopedic Surgery

## 2020-10-10 VITALS — BP 157/78 | HR 47 | Ht 75.5 in | Wt 281.0 lb

## 2020-10-10 DIAGNOSIS — M12812 Other specific arthropathies, not elsewhere classified, left shoulder: Secondary | ICD-10-CM | POA: Diagnosis not present

## 2020-10-10 DIAGNOSIS — Z9889 Other specified postprocedural states: Secondary | ICD-10-CM

## 2020-10-10 DIAGNOSIS — M75102 Unspecified rotator cuff tear or rupture of left shoulder, not specified as traumatic: Secondary | ICD-10-CM

## 2020-10-10 DIAGNOSIS — M25512 Pain in left shoulder: Secondary | ICD-10-CM

## 2020-10-10 DIAGNOSIS — G8929 Other chronic pain: Secondary | ICD-10-CM

## 2020-10-10 MED ORDER — HYDROCODONE-ACETAMINOPHEN 7.5-325 MG PO TABS: 1.0000 | ORAL_TABLET | Freq: Four times a day (QID) | ORAL | 0 refills | Status: DC | PRN

## 2020-10-10 NOTE — Addendum Note (Signed)
Addended by: Dessie Coma on: 10/10/2020 08:46 AM   Modules accepted: Orders

## 2020-10-10 NOTE — Addendum Note (Signed)
Addended by: Dessie Coma on: 10/10/2020 09:03 AM   Modules accepted: Orders

## 2020-10-10 NOTE — Progress Notes (Signed)
Chief Complaint  Patient presents with  . Shoulder Pain    L/hurts everyday and sometimes it feels like it is going to fall off.   No date as of yet for surgery left shoulder   However interested in reverse total shoulder   Encounter Diagnoses  Name Primary?  . Left rotator cuff tear arthropathy Yes  . S/P left rotator cuff repair 10/2019   . Chronic pain in left shoulder

## 2020-10-16 ENCOUNTER — Other Ambulatory Visit: Payer: Self-pay

## 2020-10-16 DIAGNOSIS — E119 Type 2 diabetes mellitus without complications: Secondary | ICD-10-CM | POA: Diagnosis not present

## 2020-10-16 DIAGNOSIS — I10 Essential (primary) hypertension: Secondary | ICD-10-CM | POA: Diagnosis not present

## 2020-10-16 DIAGNOSIS — G8929 Other chronic pain: Secondary | ICD-10-CM | POA: Diagnosis not present

## 2020-10-16 DIAGNOSIS — N400 Enlarged prostate without lower urinary tract symptoms: Secondary | ICD-10-CM | POA: Diagnosis not present

## 2020-10-16 DIAGNOSIS — M069 Rheumatoid arthritis, unspecified: Secondary | ICD-10-CM | POA: Diagnosis not present

## 2020-10-16 DIAGNOSIS — K5900 Constipation, unspecified: Secondary | ICD-10-CM | POA: Diagnosis not present

## 2020-10-16 DIAGNOSIS — E669 Obesity, unspecified: Secondary | ICD-10-CM | POA: Diagnosis not present

## 2020-10-16 DIAGNOSIS — R32 Unspecified urinary incontinence: Secondary | ICD-10-CM | POA: Diagnosis not present

## 2020-10-16 DIAGNOSIS — M25512 Pain in left shoulder: Secondary | ICD-10-CM

## 2020-10-16 DIAGNOSIS — J309 Allergic rhinitis, unspecified: Secondary | ICD-10-CM | POA: Diagnosis not present

## 2020-10-16 DIAGNOSIS — Z6834 Body mass index (BMI) 34.0-34.9, adult: Secondary | ICD-10-CM | POA: Diagnosis not present

## 2020-10-18 MED ORDER — HYDROCODONE-ACETAMINOPHEN 7.5-325 MG PO TABS: 1.0000 | ORAL_TABLET | Freq: Four times a day (QID) | ORAL | 0 refills | Status: DC | PRN

## 2020-10-25 ENCOUNTER — Other Ambulatory Visit: Payer: Self-pay

## 2020-10-25 DIAGNOSIS — M25512 Pain in left shoulder: Secondary | ICD-10-CM

## 2020-10-25 DIAGNOSIS — G8929 Other chronic pain: Secondary | ICD-10-CM

## 2020-10-25 MED ORDER — HYDROCODONE-ACETAMINOPHEN 5-325 MG PO TABS: 1.0000 | ORAL_TABLET | Freq: Four times a day (QID) | ORAL | 0 refills | Status: DC | PRN

## 2020-10-25 NOTE — Telephone Encounter (Signed)
I called the patient back and he is going to wean off the medication to help control his pain before his surgery.   He has the scan on 11/01/20.

## 2020-10-25 NOTE — Telephone Encounter (Signed)
Thank you for taking care of this for me.   Willie Howe

## 2020-10-31 ENCOUNTER — Telehealth: Payer: Self-pay | Admitting: Radiology

## 2020-10-31 NOTE — Telephone Encounter (Signed)
Thank you Amy  Elta Guadeloupe

## 2020-10-31 NOTE — Telephone Encounter (Signed)
Patient states he did not get the last prescription that was sent in, has been out of Hydrocodone several days. I advised him to take a drug holiday now through the surgery so he can get more relief from pain after surgery. Advised him extra strength Tylenol may help, he states he has some and will use it. He voiced understanding of need for drug holiday  To you [facility]

## 2020-11-01 ENCOUNTER — Other Ambulatory Visit: Payer: Self-pay

## 2020-11-01 ENCOUNTER — Ambulatory Visit (HOSPITAL_COMMUNITY)
Admission: RE | Admit: 2020-11-01 | Discharge: 2020-11-01 | Disposition: A | Discharge status: Home / Self Care | Payer: Medicare HMO | Source: Ambulatory Visit | Attending: Orthopedic Surgery | Admitting: Orthopedic Surgery

## 2020-11-01 DIAGNOSIS — G8929 Other chronic pain: Secondary | ICD-10-CM | POA: Diagnosis not present

## 2020-11-01 DIAGNOSIS — M75102 Unspecified rotator cuff tear or rupture of left shoulder, not specified as traumatic: Secondary | ICD-10-CM | POA: Diagnosis not present

## 2020-11-01 DIAGNOSIS — M12812 Other specific arthropathies, not elsewhere classified, left shoulder: Secondary | ICD-10-CM | POA: Diagnosis not present

## 2020-11-01 DIAGNOSIS — M25512 Pain in left shoulder: Secondary | ICD-10-CM | POA: Diagnosis not present

## 2020-11-01 DIAGNOSIS — M19012 Primary osteoarthritis, left shoulder: Secondary | ICD-10-CM | POA: Diagnosis not present

## 2020-11-01 DIAGNOSIS — Z9889 Other specified postprocedural states: Secondary | ICD-10-CM | POA: Insufficient documentation

## 2020-11-05 ENCOUNTER — Other Ambulatory Visit (HOSPITAL_COMMUNITY): Payer: Medicare HMO

## 2020-11-05 NOTE — Patient Instructions (Signed)
Willie Howe  11/05/2020     @PREFPERIOPPHARMACY @   Your procedure is scheduled on  11/08/2020.   Report to [name] at  [phone]  A.M.   Call this number if you have problems the morning of surgery:  [phone]   Remember:  Do not eat or drink after midnight.                       Take these medicines the morning of surgery with A SIP OF WATER  Hydrocodone (if needed).   DO NOT take any medications for diabetes the morning of your procedure.  If your glucose is 70 or below the morning of your procedure, drink 1/2 cup of clear juice and recheck your glucose in 15 minutes. If your glucose is still 70 or below, call [phone] for instructions/  If your glucose is 300 or above the morning of your procedure, call [phone] for instructions.      Do not wear jewelry, make-up or nail polish.  Do not wear lotions, powders, or perfumes, or deodorant.  Do not shave 48 hours prior to surgery.  Men may shave face and neck.  Do not bring valuables to the hospital.  [facility] is not responsible for any belongings or valuables.  Contacts, dentures or bridgework may not be worn into surgery.  Leave your suitcase in the car.  After surgery it may be brought to your room.  For patients admitted to the hospital, discharge time will be determined by your treatment team.  Patients discharged the day of surgery will not be allowed to drive home and must have someone with them for 24 hours.  Place clean sheets on your bed the night before your procedure. DO NOT sleep with pets this night.  Shower the night before and the morning of your procedure with CHG. DO NOT put CHG on your face, hair or genitals.  After each shower, dry off with a clean towel, put on clean, comfortable clothes and brush your teeth.   Special instructions:   DO NOT smoke tobacco or vape the morning of your procedure.  Please read over the following fact sheets that you were given. Anesthesia  Post-op Instructions and Care and Recovery After Surgery       Reverse Total Shoulder Replacement, Care After This sheet gives you information about how to care for yourself after your procedure. Your health care provider may also give you more specific instructions. If you have problems or questions, contact your health care provider. What can I expect after the procedure? After the procedure, it is common to have:  Pain in the shoulder and arm.  Stiffness in the shoulder and arm. Follow these instructions at home: Medicines  Take over-the-counter and prescription medicines only as told by your health care provider.  Ask your health care provider if the medicine prescribed to you: ? Requires you to avoid driving or using machinery. ? Can cause constipation. You may need to take these actions to prevent or treat constipation:  Drink enough fluid to keep your urine pale yellow.  Take over-the-counter or prescription medicines.  Eat foods that are high in fiber, such as beans, whole grains, and fresh fruits and vegetables.  Limit foods that are high in fat and processed sugars, such as fried or sweet foods. If you have a sling:  Wear the sling as told by your health care provider. Remove it only as  told by your health care provider.  Check the skin around your sling every day. Tell your health care provider about any concerns.  Loosen the sling if your fingers tingle, become numb, or turn cold and blue.  Keep the sling clean and dry. Bathing  Do not take baths, swim, or use a hot tub until your health care provider approves. Ask your health care provider if you may take showers. You may only be allowed to take sponge baths.  If the sling is not waterproof: ? Do not let it get wet. ? Cover it with a watertight covering when you take a bath or shower.  Keep your bandage (dressing) dry until your health care provider says it can be removed. Incision care  Follow  instructions from your health care provider about how to take care of your incision. Make sure you: ? Wash your hands with soap and water for at least 20 seconds before and after you change your dressing. If soap and water are not available, use hand sanitizer. ? Change your dressing as told by your health care provider. ? Leave stitches (sutures), skin glue, or adhesive strips in place. These skin closures may need to stay in place for 2 weeks or longer. If adhesive strip edges start to loosen and curl up, you may trim the loose edges. Do not remove adhesive strips completely unless your health care provider tells you to do that.  Check your incision area every day for signs of infection. Check for: ? More redness, swelling, or pain. ? Fluid or blood. ? Warmth. ? Pus or a bad smell.   Managing pain, stiffness, and swelling  If directed, put ice on your shoulder. To do this: ? If you have a removable sling, remove it as told by your health care provider. ? Put ice in a plastic bag. ? Place a towel between your skin and the bag. ? Leave the ice on for 20 minutes, 2-3 times a day. ? Remove the ice if your skin turns bright red. This is very important. If you cannot feel pain, heat, or cold, you have a greater risk of damage to the area.  Move your fingers and hand often to reduce stiffness and swelling.   Driving  If you were given a sedative during the procedure, it can affect you for several hours. Do not drive or operate machinery until your health care provider says that it is safe.  Ask your health care provider when it is safe to drive if you have a sling on your arm. Activity  Return to your normal activities as told by your health care provider. Ask your health care provider what activities are safe for you.  Do shoulder exercises as told by your health care provider.  Do not lift your arm above shoulder level until your health care provider approves.  Do not make large  movements with your arm.  Do not push or pull things until your health care provider approves.  Do not lift anything that is heavier than 5 lb (2.3 kg) until your health care provider says that it is safe. General instructions  Do not use any products that contain nicotine or tobacco, such as cigarettes, e-cigarettes, and chewing tobacco. These can delay healing after surgery. If you need help quitting, ask your health care provider.  Tell your health care provider if you plan to have dental work. Also: ? Tell your dentist about your joint replacement. ? Ask your health care  provider if there are any special instructions you need to follow before having dental care and routine cleanings.  Keep all follow-up visits. This is important. Contact a health care provider if:  You feel nauseous or you vomit.  Your arm tingles or feels numb.  Your pain gets worse, even after taking pain medicine.  You have any of these signs of infection: ? More redness, swelling, or pain around your incision. ? Fluid or blood coming from your incision. ? Warmth coming from your incision. ? Pus or a bad smell coming from your incision. ? A fever. Get help right away if:  Your shoulder joint moves out of place.  Your incision comes apart.  You have redness, swelling, pain, or warmth in your leg or arm.  You have chest pain or shortness of breath. These symptoms may represent a serious problem that is an emergency. Do not wait to see if the symptoms will go away. Get medical help right away. Call your local emergency services (911 in the [place] Do not drive yourself to the hospital. Summary  After the procedure, it is common to have some pain and stiffness.  Take over-the-counter and prescription medicines only as told by your health care provider.  Keep your bandage (dressing) dry until your health care provider says it can be removed.  Know the symptoms that should prompt you to contact your  health care provider.  Do shoulder exercises as told by your health care provider. Ask your health care provider what activities are safe for you. This information is not intended to replace advice given to you by your health care provider. Make sure you discuss any questions you have with your health care provider. Document Revised: 01/18/2020 Document Reviewed: 01/18/2020 Elsevier Patient Education  2021 [place] Anesthesia, Adult, Care After This sheet gives you information about how to care for yourself after your procedure. Your health care provider may also give you more specific instructions. If you have problems or questions, contact your health care provider. What can I expect after the procedure? After the procedure, the following side effects are common:  Pain or discomfort at the IV site.  Nausea.  Vomiting.  Sore throat.  Trouble concentrating.  Feeling cold or chills.  Feeling weak or tired.  Sleepiness and fatigue.  Soreness and body aches. These side effects can affect parts of the body that were not involved in surgery. Follow these instructions at home: For the time period you were told by your health care provider:  Rest.  Do not participate in activities where you could fall or become injured.  Do not drive or use machinery.  Do not drink alcohol.  Do not take sleeping pills or medicines that cause drowsiness.  Do not make important decisions or sign legal documents.  Do not take care of children on your own.   Eating and drinking  Follow any instructions from your health care provider about eating or drinking restrictions.  When you feel hungry, start by eating small amounts of foods that are soft and easy to digest (bland), such as toast. Gradually return to your regular diet.  Drink enough fluid to keep your urine pale yellow.  If you vomit, rehydrate by drinking water, juice, or clear broth. General instructions  If you have  sleep apnea, surgery and certain medicines can increase your risk for breathing problems. Follow instructions from your health care provider about wearing your sleep device: ? Anytime you are sleeping, including during  daytime naps. ? While taking prescription pain medicines, sleeping medicines, or medicines that make you drowsy.  Have a responsible adult stay with you for the time you are told. It is important to have someone help care for you until you are awake and alert.  Return to your normal activities as told by your health care provider. Ask your health care provider what activities are safe for you.  Take over-the-counter and prescription medicines only as told by your health care provider.  If you smoke, do not smoke without supervision.  Keep all follow-up visits as told by your health care provider. This is important. Contact a health care provider if:  You have nausea or vomiting that does not get better with medicine.  You cannot eat or drink without vomiting.  You have pain that does not get better with medicine.  You are unable to pass urine.  You develop a skin rash.  You have a fever.  You have redness around your IV site that gets worse. Get help right away if:  You have difficulty breathing.  You have chest pain.  You have blood in your urine or stool, or you vomit blood. Summary  After the procedure, it is common to have a sore throat or nausea. It is also common to feel tired.  Have a responsible adult stay with you for the time you are told. It is important to have someone help care for you until you are awake and alert.  When you feel hungry, start by eating small amounts of foods that are soft and easy to digest (bland), such as toast. Gradually return to your regular diet.  Drink enough fluid to keep your urine pale yellow.  Return to your normal activities as told by your health care provider. Ask your health care provider what activities are  safe for you. This information is not intended to replace advice given to you by your health care provider. Make sure you discuss any questions you have with your health care provider. Document Revised: 04/19/2020 Document Reviewed: 11/17/2019 Elsevier Patient Education  2021 [employer].

## 2020-11-06 ENCOUNTER — Other Ambulatory Visit (HOSPITAL_COMMUNITY)
Admission: RE | Admit: 2020-11-06 | Discharge: 2020-11-06 | Disposition: A | Discharge status: Home / Self Care | Payer: Medicare HMO | Source: Ambulatory Visit | Attending: Orthopedic Surgery | Admitting: Orthopedic Surgery

## 2020-11-06 ENCOUNTER — Encounter (HOSPITAL_COMMUNITY)
Admission: RE | Admit: 2020-11-06 | Discharge: 2020-11-06 | Disposition: A | Discharge status: Home / Self Care | Payer: Medicare HMO | Source: Ambulatory Visit | Attending: Orthopedic Surgery | Admitting: Orthopedic Surgery

## 2020-11-06 ENCOUNTER — Encounter (HOSPITAL_COMMUNITY): Payer: Self-pay

## 2020-11-06 ENCOUNTER — Other Ambulatory Visit: Payer: Self-pay

## 2020-11-06 DIAGNOSIS — Z01812 Encounter for preprocedural laboratory examination: Secondary | ICD-10-CM | POA: Diagnosis not present

## 2020-11-06 DIAGNOSIS — Z20822 Contact with and (suspected) exposure to covid-19: Secondary | ICD-10-CM | POA: Diagnosis not present

## 2020-11-06 HISTORY — DX: Essential (primary) hypertension: I10

## 2020-11-06 LAB — CBC
HCT: 42.7 % (ref 39.0–52.0)
Hemoglobin: 13.7 g/dL (ref 13.0–17.0)
MCH: 30.3 pg (ref 26.0–34.0)
MCHC: 32.1 g/dL (ref 30.0–36.0)
MCV: 94.5 fL (ref 80.0–100.0)
Platelets: 251 10*3/uL (ref 150–400)
RBC: 4.52 MIL/uL (ref 4.22–5.81)
RDW: 15.5 % (ref 11.5–15.5)
WBC: 8 10*3/uL (ref 4.0–10.5)
nRBC: 0 % (ref 0.0–0.2)

## 2020-11-06 LAB — BASIC METABOLIC PANEL
Anion gap: 9 (ref 5–15)
BUN: 14 mg/dL (ref 8–23)
CO2: 21 mmol/L — ABNORMAL LOW (ref 22–32)
Calcium: 8.7 mg/dL — ABNORMAL LOW (ref 8.9–10.3)
Chloride: 108 mmol/L (ref 98–111)
Creatinine, Ser: 0.78 mg/dL (ref 0.61–1.24)
GFR, Estimated: >60 mL/min (ref >60)
Glucose, Bld: 115 mg/dL — ABNORMAL HIGH (ref 70–99)
Potassium: 3.7 mmol/L (ref 3.5–5.1)
Sodium: 138 mmol/L (ref 135–145)

## 2020-11-06 LAB — HEMOGLOBIN A1C
Hgb A1c MFr Bld: 6.4 % — ABNORMAL HIGH (ref 4.8–5.6)
Mean Plasma Glucose: 136.98 mg/dL

## 2020-11-06 LAB — SARS CORONAVIRUS 2 (TAT 6-24 HRS): SARS Coronavirus 2: NEGATIVE

## 2020-11-07 ENCOUNTER — Telehealth: Payer: Self-pay | Admitting: Orthopedic Surgery

## 2020-11-07 ENCOUNTER — Other Ambulatory Visit: Payer: Self-pay | Admitting: Orthopedic Surgery

## 2020-11-07 DIAGNOSIS — M25512 Pain in left shoulder: Secondary | ICD-10-CM

## 2020-11-07 DIAGNOSIS — G8929 Other chronic pain: Secondary | ICD-10-CM

## 2020-11-07 NOTE — Telephone Encounter (Signed)
We have received an approval for this patient and no other concerns.

## 2020-11-07 NOTE — Telephone Encounter (Signed)
Orders are being placed for Physical Therapy referral.

## 2020-11-07 NOTE — Telephone Encounter (Signed)
-----   Message from Mordecai Rasmussen, MD sent at 11/07/2020  3:17 PM EDT ----- [place] [facility]  [name] is scheduled for surgery tomorrow.  I would like him to start PT 1-2 weeks postop, so he should have an appointment scheduled before his 1st postop with me.  Can you call him to confirm where he would like to do his PT, and then place a referral?  I will make sure that the PT has an updated protocol.  Let me know if you have any questions   Thanks Elta Guadeloupe

## 2020-11-08 ENCOUNTER — Ambulatory Visit (HOSPITAL_COMMUNITY): Payer: Medicare HMO | Admitting: Certified Registered"

## 2020-11-08 ENCOUNTER — Observation Stay (HOSPITAL_COMMUNITY)
Admission: RE | Admit: 2020-11-08 | Discharge: 2020-11-09 | Disposition: A | Discharge status: Home / Self Care | Payer: Medicare HMO | Attending: Orthopedic Surgery | Admitting: Orthopedic Surgery

## 2020-11-08 ENCOUNTER — Observation Stay (HOSPITAL_COMMUNITY): Payer: Medicare HMO

## 2020-11-08 ENCOUNTER — Encounter (HOSPITAL_COMMUNITY): Payer: Self-pay | Admitting: Orthopedic Surgery

## 2020-11-08 ENCOUNTER — Other Ambulatory Visit: Payer: Self-pay

## 2020-11-08 ENCOUNTER — Encounter (HOSPITAL_COMMUNITY)
Admission: RE | Disposition: A | Discharge status: Home / Self Care | Payer: Self-pay | Source: Home / Self Care | Attending: Orthopedic Surgery

## 2020-11-08 DIAGNOSIS — I10 Essential (primary) hypertension: Secondary | ICD-10-CM | POA: Insufficient documentation

## 2020-11-08 DIAGNOSIS — Z471 Aftercare following joint replacement surgery: Secondary | ICD-10-CM | POA: Diagnosis not present

## 2020-11-08 DIAGNOSIS — Z7982 Long term (current) use of aspirin: Secondary | ICD-10-CM | POA: Diagnosis not present

## 2020-11-08 DIAGNOSIS — Z96653 Presence of artificial knee joint, bilateral: Secondary | ICD-10-CM | POA: Insufficient documentation

## 2020-11-08 DIAGNOSIS — Z96642 Presence of left artificial hip joint: Secondary | ICD-10-CM | POA: Diagnosis not present

## 2020-11-08 DIAGNOSIS — M75102 Unspecified rotator cuff tear or rupture of left shoulder, not specified as traumatic: Principal | ICD-10-CM | POA: Insufficient documentation

## 2020-11-08 DIAGNOSIS — Z87891 Personal history of nicotine dependence: Secondary | ICD-10-CM | POA: Diagnosis not present

## 2020-11-08 DIAGNOSIS — M9689 Other intraoperative and postprocedural complications and disorders of the musculoskeletal system: Secondary | ICD-10-CM | POA: Diagnosis not present

## 2020-11-08 DIAGNOSIS — E119 Type 2 diabetes mellitus without complications: Secondary | ICD-10-CM | POA: Insufficient documentation

## 2020-11-08 DIAGNOSIS — M12812 Other specific arthropathies, not elsewhere classified, left shoulder: Secondary | ICD-10-CM | POA: Diagnosis not present

## 2020-11-08 DIAGNOSIS — M25612 Stiffness of left shoulder, not elsewhere classified: Secondary | ICD-10-CM | POA: Diagnosis not present

## 2020-11-08 DIAGNOSIS — Z7984 Long term (current) use of oral hypoglycemic drugs: Secondary | ICD-10-CM | POA: Insufficient documentation

## 2020-11-08 DIAGNOSIS — Z96612 Presence of left artificial shoulder joint: Secondary | ICD-10-CM | POA: Diagnosis not present

## 2020-11-08 DIAGNOSIS — Z79899 Other long term (current) drug therapy: Secondary | ICD-10-CM | POA: Diagnosis not present

## 2020-11-08 HISTORY — PX: TOTAL SHOULDER ARTHROPLASTY: SHX126

## 2020-11-08 LAB — GLUCOSE, CAPILLARY
Glucose-Capillary: 111 mg/dL — ABNORMAL HIGH (ref 70–99)
Glucose-Capillary: 169 mg/dL — ABNORMAL HIGH (ref 70–99)
Glucose-Capillary: 212 mg/dL — ABNORMAL HIGH (ref 70–99)
Glucose-Capillary: 267 mg/dL — ABNORMAL HIGH (ref 70–99)

## 2020-11-08 LAB — HEMOGLOBIN AND HEMATOCRIT, BLOOD
HCT: 40 % (ref 39.0–52.0)
Hemoglobin: 12.8 g/dL — ABNORMAL LOW (ref 13.0–17.0)

## 2020-11-08 SURGERY — ARTHROPLASTY, SHOULDER, TOTAL
Anesthesia: General | Site: Shoulder | Laterality: Left

## 2020-11-08 MED ORDER — VANCOMYCIN HCL 1 G IV SOLR
1000.0000 mg | INTRAVENOUS | Status: AC
  Filled 2020-11-08: qty 1000

## 2020-11-08 MED ORDER — ROCURONIUM BROMIDE 10 MG/ML (PF) SYRINGE
PREFILLED_SYRINGE | INTRAVENOUS | Status: AC
  Filled 2020-11-08: qty 20

## 2020-11-08 MED ORDER — BUPIVACAINE-EPINEPHRINE (PF) 0.5% -1:200000 IJ SOLN
INTRAMUSCULAR | Status: AC
  Filled 2020-11-08: qty 30

## 2020-11-08 MED ORDER — LIDOCAINE 2% (20 MG/ML) 5 ML SYRINGE
INTRAMUSCULAR | Status: DC | PRN
  Administered 2020-11-08: 30 mg via INTRAVENOUS

## 2020-11-08 MED ORDER — SUCCINYLCHOLINE CHLORIDE 200 MG/10ML IV SOSY
PREFILLED_SYRINGE | INTRAVENOUS | Status: DC | PRN
  Administered 2020-11-08: 120 mg via INTRAVENOUS

## 2020-11-08 MED ORDER — DIPHENHYDRAMINE HCL 12.5 MG/5ML PO ELIX: 12.5000 mg | ORAL_SOLUTION | ORAL | Status: DC | PRN

## 2020-11-08 MED ORDER — ROCURONIUM BROMIDE 10 MG/ML (PF) SYRINGE
PREFILLED_SYRINGE | INTRAVENOUS | Status: AC
  Filled 2020-11-08: qty 10

## 2020-11-08 MED ORDER — OXYCODONE HCL 5 MG PO TABS
10.0000 mg | ORAL_TABLET | ORAL | Status: DC | PRN
  Administered 2020-11-08 – 2020-11-09 (×5): 10 mg via ORAL
  Filled 2020-11-08 (×5): qty 2

## 2020-11-08 MED ORDER — CHLORHEXIDINE GLUCONATE 0.12 % MT SOLN
15.0000 mL | Freq: Once | OROMUCOSAL | Status: AC
  Administered 2020-11-08: 15 mL via OROMUCOSAL

## 2020-11-08 MED ORDER — LACTATED RINGERS IV SOLN
INTRAVENOUS | Status: DC
  Administered 2020-11-08: 1000 mL via INTRAVENOUS

## 2020-11-08 MED ORDER — GLYCOPYRROLATE PF 0.2 MG/ML IJ SOSY
PREFILLED_SYRINGE | INTRAMUSCULAR | Status: DC | PRN
  Administered 2020-11-08: .1 mg via INTRAVENOUS

## 2020-11-08 MED ORDER — DEXAMETHASONE SODIUM PHOSPHATE 10 MG/ML IJ SOLN
INTRAMUSCULAR | Status: DC | PRN
  Administered 2020-11-08: 10 mg via INTRAVENOUS

## 2020-11-08 MED ORDER — CHLORHEXIDINE GLUCONATE 0.12 % MT SOLN
OROMUCOSAL | Status: AC
  Filled 2020-11-08: qty 105

## 2020-11-08 MED ORDER — DEXTROSE 5 % IV SOLN
3.0000 g | INTRAVENOUS | Status: AC
  Administered 2020-11-08: 3 g via INTRAVENOUS
  Filled 2020-11-08: qty 3000

## 2020-11-08 MED ORDER — ONDANSETRON HCL 4 MG/2ML IJ SOLN
INTRAMUSCULAR | Status: DC | PRN
  Administered 2020-11-08: 4 mg via INTRAVENOUS

## 2020-11-08 MED ORDER — MIDAZOLAM HCL 5 MG/5ML IJ SOLN
INTRAMUSCULAR | Status: DC | PRN
  Administered 2020-11-08: 2 mg via INTRAVENOUS

## 2020-11-08 MED ORDER — PROPOFOL 10 MG/ML IV BOLUS
INTRAVENOUS | Status: AC
  Filled 2020-11-08: qty 40

## 2020-11-08 MED ORDER — PHENYLEPHRINE 40 MCG/ML (10ML) SYRINGE FOR IV PUSH (FOR BLOOD PRESSURE SUPPORT)
PREFILLED_SYRINGE | INTRAVENOUS | Status: DC | PRN
  Administered 2020-11-08: 80 ug via INTRAVENOUS
  Administered 2020-11-08: 120 ug via INTRAVENOUS

## 2020-11-08 MED ORDER — PHENYLEPHRINE HCL (PRESSORS) 10 MG/ML IV SOLN
INTRAVENOUS | Status: AC
  Filled 2020-11-08: qty 1

## 2020-11-08 MED ORDER — DEXAMETHASONE SODIUM PHOSPHATE 10 MG/ML IJ SOLN
INTRAMUSCULAR | Status: AC
  Filled 2020-11-08: qty 1

## 2020-11-08 MED ORDER — PHENYLEPHRINE HCL-NACL 10-0.9 MG/250ML-% IV SOLN
INTRAVENOUS | Status: DC | PRN
  Administered 2020-11-08: 50 ug/min via INTRAVENOUS

## 2020-11-08 MED ORDER — SUCCINYLCHOLINE CHLORIDE 200 MG/10ML IV SOSY
PREFILLED_SYRINGE | INTRAVENOUS | Status: AC
  Filled 2020-11-08: qty 10

## 2020-11-08 MED ORDER — ONDANSETRON HCL 4 MG/2ML IJ SOLN
INTRAMUSCULAR | Status: AC
  Filled 2020-11-08: qty 2

## 2020-11-08 MED ORDER — BUPIVACAINE-EPINEPHRINE (PF) 0.5% -1:200000 IJ SOLN
INTRAMUSCULAR | Status: DC | PRN
  Administered 2020-11-08: 30 mL via PERINEURAL

## 2020-11-08 MED ORDER — ROPIVACAINE HCL 5 MG/ML IJ SOLN
INTRAMUSCULAR | Status: AC
  Filled 2020-11-08: qty 30

## 2020-11-08 MED ORDER — METFORMIN HCL ER 500 MG PO TB24
500.0000 mg | ORAL_TABLET | Freq: Two times a day (BID) | ORAL | Status: DC
  Administered 2020-11-08 – 2020-11-09 (×2): 500 mg via ORAL
  Filled 2020-11-08 (×2): qty 1

## 2020-11-08 MED ORDER — GLYCOPYRROLATE PF 0.2 MG/ML IJ SOSY
PREFILLED_SYRINGE | INTRAMUSCULAR | Status: AC
  Filled 2020-11-08: qty 1

## 2020-11-08 MED ORDER — HYDROMORPHONE HCL 1 MG/ML IJ SOLN: 0.2500 mg | INTRAMUSCULAR | Status: DC | PRN

## 2020-11-08 MED ORDER — LORATADINE 10 MG PO TABS
10.0000 mg | ORAL_TABLET | Freq: Every day | ORAL | Status: DC
  Administered 2020-11-08 – 2020-11-09 (×2): 10 mg via ORAL
  Filled 2020-11-08 (×2): qty 1

## 2020-11-08 MED ORDER — VANCOMYCIN HCL 1000 MG IV SOLR
INTRAVENOUS | Status: DC | PRN
  Administered 2020-11-08: 1000 mg via TOPICAL

## 2020-11-08 MED ORDER — ONDANSETRON HCL 4 MG/2ML IJ SOLN: 4.0000 mg | Freq: Once | INTRAMUSCULAR | Status: DC | PRN

## 2020-11-08 MED ORDER — MIDAZOLAM HCL 2 MG/2ML IJ SOLN
INTRAMUSCULAR | Status: AC
  Filled 2020-11-08: qty 2

## 2020-11-08 MED ORDER — OXYCODONE HCL 5 MG PO TABS
5.0000 mg | ORAL_TABLET | ORAL | Status: DC | PRN
  Administered 2020-11-08: 5 mg via ORAL
  Filled 2020-11-08: qty 1

## 2020-11-08 MED ORDER — TRANEXAMIC ACID-NACL 1000-0.7 MG/100ML-% IV SOLN
1000.0000 mg | INTRAVENOUS | Status: AC
  Administered 2020-11-08: 1000 mg via INTRAVENOUS
  Filled 2020-11-08: qty 100

## 2020-11-08 MED ORDER — FENTANYL CITRATE (PF) 100 MCG/2ML IJ SOLN
INTRAMUSCULAR | Status: AC
  Filled 2020-11-08: qty 2

## 2020-11-08 MED ORDER — PROPOFOL 10 MG/ML IV BOLUS
INTRAVENOUS | Status: DC | PRN
  Administered 2020-11-08: 170 mg via INTRAVENOUS

## 2020-11-08 MED ORDER — ROCURONIUM BROMIDE 10 MG/ML (PF) SYRINGE
PREFILLED_SYRINGE | INTRAVENOUS | Status: DC | PRN
  Administered 2020-11-08: 55 mg via INTRAVENOUS
  Administered 2020-11-08 (×4): 20 mg via INTRAVENOUS
  Administered 2020-11-08: 5 mg via INTRAVENOUS

## 2020-11-08 MED ORDER — PHENYLEPHRINE 40 MCG/ML (10ML) SYRINGE FOR IV PUSH (FOR BLOOD PRESSURE SUPPORT)
PREFILLED_SYRINGE | INTRAVENOUS | Status: AC
  Filled 2020-11-08: qty 20

## 2020-11-08 MED ORDER — DEXMEDETOMIDINE (PRECEDEX) IN NS 20 MCG/5ML (4 MCG/ML) IV SYRINGE
PREFILLED_SYRINGE | INTRAVENOUS | Status: AC
  Filled 2020-11-08: qty 5

## 2020-11-08 MED ORDER — ASPIRIN 81 MG PO CHEW
81.0000 mg | CHEWABLE_TABLET | Freq: Every day | ORAL | Status: DC
  Administered 2020-11-09: 81 mg via ORAL
  Filled 2020-11-08: qty 1

## 2020-11-08 MED ORDER — SUGAMMADEX SODIUM 200 MG/2ML IV SOLN
INTRAVENOUS | Status: DC | PRN
  Administered 2020-11-08: 300 mg via INTRAVENOUS

## 2020-11-08 MED ORDER — SUGAMMADEX SODIUM 500 MG/5ML IV SOLN
INTRAVENOUS | Status: AC
  Filled 2020-11-08: qty 5

## 2020-11-08 MED ORDER — CEFAZOLIN SODIUM-DEXTROSE 2-4 GM/100ML-% IV SOLN
INTRAVENOUS | Status: AC
  Filled 2020-11-08: qty 100

## 2020-11-08 MED ORDER — ONDANSETRON HCL 4 MG/2ML IJ SOLN: 4.0000 mg | Freq: Four times a day (QID) | INTRAMUSCULAR | Status: DC | PRN

## 2020-11-08 MED ORDER — ONDANSETRON HCL 4 MG PO TABS: 4.0000 mg | ORAL_TABLET | Freq: Four times a day (QID) | ORAL | Status: DC | PRN

## 2020-11-08 MED ORDER — LIDOCAINE HCL (PF) 2 % IJ SOLN
INTRAMUSCULAR | Status: AC
  Filled 2020-11-08: qty 20

## 2020-11-08 MED ORDER — SEVOFLURANE IN SOLN
RESPIRATORY_TRACT | Status: AC
  Filled 2020-11-08: qty 250

## 2020-11-08 MED ORDER — DEXAMETHASONE SODIUM PHOSPHATE 10 MG/ML IJ SOLN
INTRAMUSCULAR | Status: AC
  Filled 2020-11-08: qty 2

## 2020-11-08 MED ORDER — CEFAZOLIN SODIUM-DEXTROSE 1-4 GM/50ML-% IV SOLN
INTRAVENOUS | Status: AC
  Filled 2020-11-08: qty 50

## 2020-11-08 MED ORDER — FENTANYL CITRATE (PF) 250 MCG/5ML IJ SOLN
INTRAMUSCULAR | Status: DC | PRN
  Administered 2020-11-08: 25 ug via INTRAVENOUS
  Administered 2020-11-08: 50 ug via INTRAVENOUS
  Administered 2020-11-08: 25 ug via INTRAVENOUS

## 2020-11-08 MED ORDER — TAMSULOSIN HCL 0.4 MG PO CAPS
0.4000 mg | ORAL_CAPSULE | Freq: Every day | ORAL | Status: DC
  Administered 2020-11-08: 0.4 mg via ORAL
  Filled 2020-11-08: qty 1

## 2020-11-08 MED ORDER — SODIUM CHLORIDE 0.9 % IR SOLN
Status: DC | PRN
  Administered 2020-11-08: 1000 mL
  Administered 2020-11-08: 3000 mL

## 2020-11-08 MED ORDER — CEFAZOLIN SODIUM-DEXTROSE 2-4 GM/100ML-% IV SOLN
2.0000 g | Freq: Three times a day (TID) | INTRAVENOUS | Status: AC
  Administered 2020-11-08 – 2020-11-09 (×3): 2 g via INTRAVENOUS
  Filled 2020-11-08 (×3): qty 100

## 2020-11-08 MED ORDER — ORAL CARE MOUTH RINSE: 15.0000 mL | Freq: Once | OROMUCOSAL | Status: AC

## 2020-11-08 SURGICAL SUPPLY — 80 items
BIT DRILL FLUTED 3.0 STRL (BIT) ×2 IMPLANT
BLADE SAW SGTL 83.5X18.5 (BLADE) ×4 IMPLANT
BNDG COHESIVE 4X5 TAN STRL (GAUZE/BANDAGES/DRESSINGS) ×2 IMPLANT
BNDG GAUZE ELAST 4 BULKY (GAUZE/BANDAGES/DRESSINGS) ×2 IMPLANT
CALIBRATOR GLENOID VIP 5-D (SYSTAGENIX WOUND MANAGEMENT) ×2 IMPLANT
CHLORAPREP W/TINT 26 (MISCELLANEOUS) ×4 IMPLANT
CLOTH BEACON ORANGE TIMEOUT ST (SAFETY) ×2 IMPLANT
CLSR STERI-STRIP ANTIMIC 1/2X4 (GAUZE/BANDAGES/DRESSINGS) ×2 IMPLANT
COOLER ICEMAN CLASSIC (MISCELLANEOUS) ×2 IMPLANT
COVER LIGHT HANDLE STERIS (MISCELLANEOUS) ×4 IMPLANT
COVER WAND RF STERILE (DRAPES) ×2 IMPLANT
CUFF CRYO UNI SHDR 32X48 (MISCELLANEOUS) ×2 IMPLANT
CUP SUT UNIV REVERS 36 NEUTRAL (Cup) ×2 IMPLANT
DECANTER SPIKE VIAL GLASS SM (MISCELLANEOUS) ×2 IMPLANT
DRAPE INCISE IOBAN 44X35 STRL (DRAPES) ×2 IMPLANT
DRAPE ORTHO SPLIT 77X108 STRL (DRAPES) ×4
DRAPE SHOULDER BEACH CHAIR (DRAPES) ×2 IMPLANT
DRAPE SURG ORHT 6 SPLT 77X108 (DRAPES) ×2 IMPLANT
DRAPE U-SHAPE 47X51 STRL (DRAPES) ×2 IMPLANT
DRESSING AQUACEL AG ADV 3.5X12 (MISCELLANEOUS) ×1 IMPLANT
DRSG AQUACEL AG ADV 3.5X10 (GAUZE/BANDAGES/DRESSINGS) ×2 IMPLANT
DRSG AQUACEL AG ADV 3.5X12 (MISCELLANEOUS) ×2
ELECT REM PT RETURN 9FT ADLT (ELECTROSURGICAL) ×2
ELECTRODE REM PT RTRN 9FT ADLT (ELECTROSURGICAL) ×1 IMPLANT
GLENOID UNI REV MOD 24 +2 LAT (Joint) ×2 IMPLANT
GLENOSPHERE 36 +4 LAT/24 (Joint) ×2 IMPLANT
GLOVE SKINSENSE NS SZ8.0 LF (GLOVE) ×3
GLOVE SKINSENSE STRL SZ8.0 LF (GLOVE) ×3 IMPLANT
GLOVE SRG 8 PF TXTR STRL LF DI (GLOVE) ×1 IMPLANT
GLOVE SURG UNDER POLY LF SZ7 (GLOVE) ×6 IMPLANT
GLOVE SURG UNDER POLY LF SZ8 (GLOVE) ×2
GOWN STRL REUS W/ TWL XL LVL3 (GOWN DISPOSABLE) ×1 IMPLANT
GOWN STRL REUS W/TWL LRG LVL3 (GOWN DISPOSABLE) ×4 IMPLANT
GOWN STRL REUS W/TWL XL LVL3 (GOWN DISPOSABLE) ×2
HANDPIECE INTERPULSE COAX TIP (DISPOSABLE) ×2
HOOD W/PEELAWAY (MISCELLANEOUS) ×8 IMPLANT
INST SET MINOR BONE (KITS) ×2 IMPLANT
IV NS IRRIG 3000ML ARTHROMATIC (IV SOLUTION) ×2 IMPLANT
KIT BLADEGUARD II DBL (SET/KITS/TRAYS/PACK) ×2 IMPLANT
KIT POSITION SHOULDER SCHLEI (MISCELLANEOUS) ×2 IMPLANT
KIT TURNOVER KIT A (KITS) ×2 IMPLANT
LINER HUMERAL 36 +3MM SM (Shoulder) ×4 IMPLANT
MANIFOLD NEPTUNE II (INSTRUMENTS) ×2 IMPLANT
MARKER SKIN DUAL TIP RULER LAB (MISCELLANEOUS) ×2 IMPLANT
NEEDLE HYPO 21X1.5 SAFETY (NEEDLE) IMPLANT
NEEDLE MA TROC 1/2 (NEEDLE) ×2 IMPLANT
NS IRRIG 1000ML POUR BTL (IV SOLUTION) ×2 IMPLANT
PACK BASIC III (CUSTOM PROCEDURE TRAY) ×2
PACK SRG BSC III STRL LF ECLPS (CUSTOM PROCEDURE TRAY) ×1 IMPLANT
PACK TOTAL JOINT (CUSTOM PROCEDURE TRAY) ×2 IMPLANT
PAD ARMBOARD 7.5X6 YLW CONV (MISCELLANEOUS) ×2 IMPLANT
PASSER SUT SWANSON 36MM LOOP (INSTRUMENTS) ×2 IMPLANT
PENCIL SMOKE EVACUATOR (MISCELLANEOUS) ×2 IMPLANT
PIN NITINOL TARGETER 2.8 (PIN) ×2 IMPLANT
PIN SET MODULAR GLENOID SYSTEM (PIN) ×2 IMPLANT
PRESSURIZER FEMORAL UNIV (MISCELLANEOUS) ×2 IMPLANT
SCREW CENTRAL MODULAR 20 (Screw) ×2 IMPLANT
SCREW PERI LOCK 5.5X16 (Screw) ×4 IMPLANT
SCREW PERI LOCK 5.5X32 (Screw) ×2 IMPLANT
SCREW PERIPHERAL 5.5X28 LOCK (Screw) ×2 IMPLANT
SET BASIN LINEN APH (SET/KITS/TRAYS/PACK) ×2 IMPLANT
SET HNDPC FAN SPRY TIP SCT (DISPOSABLE) ×1 IMPLANT
SET SHOULDER TRAC (MISCELLANEOUS) ×1 IMPLANT
SET SHOULDER TRACTION (MISCELLANEOUS) ×2
SLING ARM ULTRA III XL (SLING) IMPLANT
SLING ULTRA II L (ORTHOPEDIC SUPPLIES) ×2 IMPLANT
SPONGE LAP 18X18 RF (DISPOSABLE) ×6 IMPLANT
STEM HUMERAL UNI REVERS SZ6 (Stem) ×2 IMPLANT
STRIP CLOSURE SKIN 1/2X4 (GAUZE/BANDAGES/DRESSINGS) ×2 IMPLANT
SUT MNCRL AB 4-0 PS2 18 (SUTURE) ×4 IMPLANT
SUT MON AB 2-0 CT1 36 (SUTURE) ×4 IMPLANT
SUT VIC AB 1 CT1 27 (SUTURE) ×4
SUT VIC AB 1 CT1 27XBRD ANTBC (SUTURE) ×2 IMPLANT
SUTURE TAPE 1.3 40 TPR END (SUTURE) ×6 IMPLANT
SUTURETAPE 1.3 40 TPR END (SUTURE) ×12
SYR 30ML LL (SYRINGE) ×2 IMPLANT
SYR BULB IRRIG 60ML STRL (SYRINGE) ×4 IMPLANT
TOWER CARTRIDGE SMART MIX (DISPOSABLE) ×2 IMPLANT
WATER STERILE IRR 1000ML POUR (IV SOLUTION) ×2 IMPLANT
YANKAUER SUCT 12FT TUBE ARGYLE (SUCTIONS) ×2 IMPLANT

## 2020-11-08 NOTE — Interval H&P Note (Signed)
History and Physical Interval Note:  11/08/2020 7:14 AM  Willie Howe  has presented today for surgery, with the diagnosis of Failed left shoulder rotator cuff repair..  The various methods of treatment have been discussed with the patient and family. After consideration of risks, benefits and other options for treatment, the patient has consented to  Procedure(s) with comments: TOTAL SHOULDER ARTHROPLASTY (Left) - Left shoulder Reverse Arthroplasty as a surgical intervention.  The patient's history has been reviewed, patient examined, no change in status, stable for surgery.  I have reviewed the patient's chart and labs.  Questions were answered to the patient's satisfaction.    He has previously underwent left rotator cuff repair without success.  He continues to have pain and pseudoparalysis.  He has had PT following his 2 previous surgeries without sustained improvement.  He was taking narcotics prior to surgery for his pain.  Injections did not help with his pain.  We discussed reverse shoulder arthroplasty and he is interested in proceeding with surgery.    Mordecai Rasmussen

## 2020-11-08 NOTE — Anesthesia Postprocedure Evaluation (Signed)
Anesthesia Post Note  Patient: Willie Howe  Procedure(s) Performed: TOTAL SHOULDER ARTHROPLASTY (Left Shoulder)  Patient location during evaluation: PACU Anesthesia Type: General Level of consciousness: awake and alert and patient cooperative Pain management: pain level controlled Vital Signs Assessment: post-procedure vital signs reviewed and stable Respiratory status: spontaneous breathing and respiratory function stable Cardiovascular status: blood pressure returned to baseline and stable Anesthetic complications: no   No complications documented.   Last Vitals:  Vitals:   11/08/20 0649  BP: (!) 156/87  Pulse: 65  Resp: (!) 22  Temp: 36.8 C  SpO2: 98%    Last Pain:  Vitals:   11/08/20 0649  TempSrc: Oral  PainSc: 6                  Maiko Salais, Arther Dames

## 2020-11-08 NOTE — Anesthesia Procedure Notes (Signed)
Procedure Name: Intubation Date/Time: 11/08/2020 7:56 AM Performed by: Myna Bright, CRNA Patient Re-evaluated:Patient Re-evaluated prior to induction Oxygen Delivery Method: Circle system utilized Preoxygenation: Pre-oxygenation with 100% oxygen Induction Type: IV induction Ventilation: Mask ventilation with difficulty Laryngoscope Size: Mac and 4 Grade View: Grade I Tube type: Oral Tube size: 8.0 mm Number of attempts: 1 Airway Equipment and Method: Stylet Placement Confirmation: ETT inserted through vocal cords under direct vision,  positive ETCO2 and breath sounds checked- equal and bilateral Secured at: 22 cm Tube secured with: Tape Dental Injury: Teeth and Oropharynx as per pre-operative assessment

## 2020-11-08 NOTE — H&P (Signed)
Below is the most recent clinic note for Willie Howe; any pertinent information regarding their recent medical history will be updated on the day of surgery.   New Patient Visit  Assessment: Willie Howe is a 60 y.o. male with the following: Left shoulder rotator cuff arthropathy; previous failed rotator cuff repair x 2   Plan: [name] has longstanding left shoulder pain and dysfunction.  He has previously underwent rotator cuff repair, and both times these were unsuccessful.  Repeat MRI demonstrated the rotator cuff tendons were torn and retracted, as well as some muscular atrophy.  He has demonstrated pseudoparalysis on physical exam, and also has pain requiring the use of narcotics.  Based on all this, I feel he is an excellent candidate for a reverse shoulder arthroplasty.  The procedure and recovery was discussed with the patient today.  All questions were answered.  He is interested in pursuing surgery.  Risks and benefits of the procedure, including, but not limited to infection, bleeding, dislocation, need for further surgery, damage to surrounding structures, persistent pain and stiffness, and more severe complications associated with anesthesia were discussed.  The patient has rheumatoid arthritis, and is currently taking methotrexate.  He is also diabetic, but states his A1c is below 7.  Due to his medical comorbidities, he will require medical clearance prior to surgery.  Once this has been obtained, we will finalize a date and schedule a CT scan prior to surgery.  All questions were answered, and he is amenable to this plan.   Follow-up: Return for After medical clearance for OR.  Subjective:      Chief Complaint  Patient presents with  . Shoulder Pain    Left shld pain    History of Present Illness: Willie Howe is a 60 y.o. RHD male who presents for evaluation of his left shoulder.  Briefly, he is previously underwent left rotator cuff repair with Dr.  Aline Brochure, twice.  He did not heal following either surgery.  A recent MRI demonstrated a full-thickness rotator cuff tear with retraction.  As a result, he seeks further options for his persistent pain and dysfunction.  He has difficulty moving his left shoulder.  He also notes a deep, aching pain within his left shoulder.  He has been taking hydrocodone recently for his pain.  In addition, he will occasionally take ibuprofen.  He has had injections in the past, but these are no longer providing sustained relief.  Of note, he does have rheumatoid arthritis and is currently taking methotrexate.   Review of Systems: No fevers or chills No numbness or tingling No chest pain No shortness of breath No bowel or bladder dysfunction No GI distress No headaches   Medical History:      Past Medical History:  Diagnosis Date  . BPPV (benign paroxysmal positional vertigo)   . DVT (deep venous thrombosis) ([place])   . Graves disease 1996  . History of kidney stones   . History of pulmonary embolism   . Obesity   . Rheumatoid arthritis ([place])   . Sleep apnea    Intolerant of CPAP  . Type 2 diabetes mellitus ([place])     Past Surgical History:  Procedure Laterality Date  . BACK SURGERY  2015  . COLONOSCOPY    . EXAM UNDER ANESTHESIA WITH MANIPULATION OF KNEE Right 04/18/2015   Procedure: MANIPULATION OF RIGHT KNEE UNDER ANESTHESIA;  Surgeon: Carole Civil, MD;  Location: [facility];  Service: Orthopedics;  Laterality: Right;  . IVC Filter     removed December 2016  . [place]  2010  . KNEE ARTHROSCOPY Right 2006   meniscus   . KNEE ARTHROSCOPY WITH MEDIAL MENISECTOMY Left 04/24/2016   Procedure: LEFT KNEE ARTHROSCOPY WITH PARTIAL MEDIAL MENISECTOMY;  Surgeon: Carole Civil, MD;  Location: [facility];  Service: Orthopedics;  Laterality: Left;  . KNEE SURGERY     x 2  . KNEE SURGERY Left 1976   ?ligament repair   . LEFT HEART CATH AND CORONARY  ANGIOGRAPHY N/A 11/29/2019   Procedure: LEFT HEART CATH AND CORONARY ANGIOGRAPHY;  Surgeon: Martinique, Peter M, MD;  Location: [facility];  Service: Cardiovascular;  Laterality: N/A;  . LUMBAR WOUND DEBRIDEMENT N/A 02/23/2014   Procedure: LUMBAR WOUND DEBRIDEMENT;  Surgeon: Faythe Ghee, MD;  Location: [facility];  Service: Neurosurgery;  Laterality: N/A;  exploration lumbar wound. repair of dural defect.  Marland Kitchen MENISECTOMY Right    open medial   . NEPHROLITHOTOMY    . PERIPHERAL VASCULAR CATHETERIZATION N/A 01/30/2015   Procedure: IVC Filter Insertion;  Surgeon: Serafina Mitchell, MD;  Location: [facility];  Service: Cardiovascular;  Laterality: N/A;  . PERIPHERAL VASCULAR CATHETERIZATION N/A 04/24/2015   Procedure: IVC Filter Removal;  Surgeon: Serafina Mitchell, MD;  Location: [facility];  Service: Cardiovascular;  Laterality: N/A;  . PERIPHERAL VASCULAR CATHETERIZATION N/A 08/14/2015   Procedure: IVC Filter Removal;  Surgeon: Serafina Mitchell, MD;  Location: [facility];  Service: Cardiovascular;  Laterality: N/A;  . SHOULDER ARTHROSCOPY WITH OPEN ROTATOR CUFF REPAIR Left 11/10/2019   Procedure: SHOULDER ARTHROSCOPY WITH OPEN ROTATOR CUFF REPAIR;  Surgeon: Carole Civil, MD;  Location: [facility];  Service: Orthopedics;  Laterality: Left;  pt tested + on 2/23, does not need Covid test < 90 days  . SHOULDER ARTHROSCOPY WITH ROTATOR CUFF REPAIR AND OPEN BICEPS TENODESIS Left 02/15/2019   Procedure: SHOULDER ARTHROSCOPY WITH open ROTATOR CUFF REPAIR AND OPEN BICEPS TENODESIS;  Surgeon: Carole Civil, MD;  Location: [facility];  Service: Orthopedics;  Laterality: Left;  . SPINE SURGERY  2015   Dr Hal Neer Fusion   . SPINE SURGERY  1999   discectomy  . TOTAL KNEE ARTHROPLASTY Right 02/02/2015   Procedure:  RIGHT TOTAL KNEE ARTHROPLASTY;  Surgeon: Carole Civil, MD;  Location: [facility];  Service: Orthopedics;  Laterality: Right;  LM with pt's daughter of new arrival  time (10:45)    . TOTAL KNEE ARTHROPLASTY Left 07/24/2016   Procedure: TOTAL KNEE ARTHROPLASTY;  Surgeon: Carole Civil, MD;  Location: [facility];  Service: Orthopedics;  Laterality: Left;         Family History  Problem Relation Age of Onset  . Deep vein thrombosis Mother   . Hypertension Mother   . Breast cancer Mother   . Hypertension Father   . Prostate cancer Father   . CAD Sister    Social History        Tobacco Use  . Smoking status: Former Smoker    Packs/day: 1.50    Years: 20.00    Pack years: 30.00    Types: Cigarettes    Quit date: 02/03/1995    Years since quitting: 25.6  . Smokeless tobacco: Never Used  Vaping Use  . Vaping Use: Never used  Substance Use Topics  . Alcohol use: No    Alcohol/week: 0.0 standard drinks  . Drug use: No  Allergies  Allergen Reactions  . Statins Other (See Comments)    Muscle pain  . Lisinopril Cough    Active Medications      Current Meds  Medication Sig  . aspirin EC 81 MG tablet Take 81 mg by mouth daily.  . cetirizine (ZYRTEC) 10 MG tablet Take 10 mg by mouth daily.  Marland Kitchen docusate sodium (COLACE) 100 MG capsule Take 100 mg by mouth every other day. At night  . folic acid (FOLVITE) 1 MG tablet Take 1 mg by mouth daily.  Marland Kitchen HYDROcodone-acetaminophen (NORCO) 7.5-325 MG tablet Take 1 tablet by mouth every 6 (six) hours as needed for moderate pain.  Marland Kitchen HYDROcodone-acetaminophen (NORCO/VICODIN) 5-325 MG tablet Take 1 tablet by mouth every 6 (six) hours as needed for up to 5 days for moderate pain.  Marland Kitchen inFLIXimab-abda (RENFLEXIS IV) Inject 1 Dose into the vein every 6 (six) weeks.  . metFORMIN (GLUCOPHAGE-XR) 500 MG 24 hr tablet Take 1 tablet by mouth 2 (two) times daily.  . methotrexate 50 MG/2ML injection Inject 200 mg into the muscle every Friday.   . predniSONE (DELTASONE) 5 MG tablet Take 5 mg by mouth daily.  . promethazine (PHENERGAN) 12.5 MG tablet Take 1 tablet (12.5 mg total) by  mouth every 6 (six) hours as needed for nausea or vomiting.  . tamsulosin (FLOMAX) 0.4 MG CAPS capsule Take 0.4 mg by mouth at bedtime.       Objective: BP (!) 143/94   Pulse 62   Ht 6' 3.5" (1.918 m)   Wt 274 lb (124.3 kg)   BMI 33.80 kg/m   Physical Exam:  General: Alert and oriented, no acute distress Gait: Normal  Evaluation left shoulder demonstrates a well-healed anterior lateral surgical incision, without surrounding erythema or drainage.  No tenderness to palpation.  No deformity is appreciated.  Abduction limited to 70 degrees at his side.  Forward flexion limited to 85 degrees.  Internal rotation to his back pocket.  Positive drop arm test.  4/5 subscapularis.    IMAGING: I personally reviewed images previously obtained in clinic   MRI of the left shoulder demonstrate sequelae of previous rotator cuff repairs.  Superior rotator cuff tendons are torn and retracted to the level of the glenoid.  There is atrophy of the supraspinatus.   New Medications:      Meds ordered this encounter  Medications  . HYDROcodone-acetaminophen (NORCO/VICODIN) 5-325 MG tablet    Sig: Take 1 tablet by mouth every 6 (six) hours as needed for up to 5 days for moderate pain.    Dispense:  20 tablet    Refill:  0

## 2020-11-08 NOTE — Discharge Instructions (Signed)
Riniyah Speich A. Amedeo Kinsman, MD [facility] [address] [place],  [place]  [address] Phone: [phone] Fax: [phone]    [place] ? You may leave the operative dressing in place until your follow-up appointment. ? KEEP THE INCISIONS CLEAN AND DRY. ? There may be a small amount of fluid/bleeding leaking at the surgical site. This is normal after surgery.  ? If it fills with liquid or blood please call us immediately to change it for you. ? Use the provided ice machine or Ice packs as often as possible for the first 3-4 days, then as needed for pain relief.  Keep a layer of cloth or a shirt between your skin and the cooling unit to prevent frost bite as it can get very cold.  SHOWERING: - You may shower on Post-Op Day #2.  - The dressing is water resistant but do not scrub it as it may start to peel up.   - You may remove the sling for showering, but keep a water resistant pillow under the arm to keep both the  elbow and shoulder away from the body (mimicking the abduction sling).  - Gently pat the area dry.  - Do not soak the shoulder in water. Do not go swimming in the pool or ocean until your sutures are removed. - KEEP THE INCISIONS CLEAN AND DRY.  EXERCISES ? Wear the sling at all times except when doing your exercises. You may remove the sling for showering, but keep the arm across the chest or in a secondary sling.    ? Accidental/Purposeful External Rotation and shoulder flexion (reaching behind you) is to be avoided at all costs for the first month. ? It is ok to come out of your sling if your are sitting and have assistance for eating.  Do not lift anything heavier than 1 pound until we discuss it further in clinic. ? Please perform the exercises:   . Elbow / Hand / Wrist  Range of Motion Exercises . Grip strengthening   REGIONAL ANESTHESIA (NERVE BLOCKS) . The anesthesia team may have  performed a nerve block for you if safe in the setting of your care.  This is a great tool used to minimize pain.  Typically the block may start wearing off overnight but the long acting medicine may last for 3-4 days.  The nerve block wearing off can be a challenging period but please utilize your as needed pain medications to try and manage this period.    POST-OP MEDICATIONS- Multimodal approach to pain control . In general your pain will be controlled with a combination of substances.  Prescriptions unless otherwise discussed are electronically sent to your pharmacy.  This is a carefully made plan we use to minimize narcotic use.     ? Meloxicam OR Celebrex - Anti-inflammatory medication taken on a scheduled basis ? Acetaminophen - Non-narcotic pain medicine taken on a scheduled basis  ? Oxycodone - This is a strong narcotic, to be used only on an "as needed" basis for pain. ? Aspirin 81mg  - This medicine is used to minimize the risk of blood clots after surgery. ? Omeprazole - daily medicine to protect your stomach while taking anti-inflammatories.  This may only be used in higher risk patients. ? Zofran -  take as needed for nausea  Meloxicam/Celebrex - these are anti-inflammatory and pain relievers.  Do not take additional ibuprofen, naproxen or other  NSAID while taking this medicine.   FOLLOW-UP ? If you develop a Fever (>101.5), Redness or Drainage from the surgical incision site, please call our office to arrange for an evaluation. ? Please call the office to schedule a follow-up appointment for a wound check, 7-10 days post-operatively.  IF YOU HAVE ANY QUESTIONS, PLEASE FEEL FREE TO CALL OUR OFFICE.  HELPFUL INFORMATION  . If you had a block, it will wear off between 8-24 hrs postop typically.  This is period when your pain may go from nearly zero to the pain you would have had post-op without the block.  This is an abrupt transition but nothing dangerous is happening.  You may take  an extra dose of narcotic when this happens.  ? Your arm will be in a sling following surgery. You will be in this sling for the next 3-4 weeks.  I will let you know the exact duration at your follow-up visit.  ? You may be more comfortable sleeping in a semi-seated position the first few nights following surgery.  Keep a pillow propped under the elbow and forearm for comfort.  If you have a recliner type of chair it might be beneficial.  If not that is fine too, but it would be helpful to sleep propped up with pillows behind your operated shoulder as well under your elbow and forearm.  This will reduce pulling on the suture lines.  ? When dressing, put your operative arm in the sleeve first.  When getting undressed, take your operative arm out last.  Loose fitting, button-down shirts are recommended.  ? In most states it is against the law to drive while your arm is in a sling. And certainly against the law to drive while taking narcotics.  ? You may return to work/school in the next couple of days when you feel up to it. Desk work and typing in the sling is fine.  ? We suggest you use the pain medication the first night prior to going to bed, in order to ease any pain when the anesthesia wears off. You should avoid taking pain medications on an empty stomach as it will make you nauseous.  ? Do not drink alcoholic beverages or take illicit drugs when taking pain medications.  ? Pain medication may make you constipated.  Below are a few solutions to try in this order: - Decrease the amount of pain medication if you aren't having pain. - Drink lots of decaffeinated fluids. - Drink prune juice and/or each dried prunes  o If the first 3 don't work start with additional solutions - Take Colace - an over-the-counter stool softener - Take Senokot - an over-the-counter laxative - Take Miralax - a stronger over-the-counter laxative   Dental Antibiotics:  In most cases prophylactic antibiotics  for Dental procdeures after total joint surgery are not necessary.  Exceptions are as follows:  1. History of prior total joint infection  2. Severely immunocompromised (Organ Transplant, cancer chemotherapy, Rheumatoid biologic meds such as [place])  3. Poorly controlled diabetes (A1C &gt; 8.0, blood glucose over 200)  If you have one of these conditions, contact your surgeon for an antibiotic prescription, prior to your dental procedure.

## 2020-11-08 NOTE — Progress Notes (Addendum)
MD notified that patient is complaining of severe pain after uriniating . MD ordered to educate patient of the side effects on d.c foley catheter. MD notified that patient is bleeding a moderate amount of blood from his incision site. MD ordered to reinforce dressing and that he would change dressing in the a.m.

## 2020-11-08 NOTE — Anesthesia Preprocedure Evaluation (Signed)
Anesthesia Evaluation  Patient identified by MRN, date of birth, ID band Patient awake    Reviewed: Allergy & Precautions, H&P , NPO status , Patient's Chart, lab work & pertinent test results, reviewed documented beta blocker date and time   Airway Mallampati: II  TM Distance: >3 FB Neck ROM: limited    Dental  (+) Teeth Intact   Pulmonary sleep apnea , former smoker,    Pulmonary exam normal        Cardiovascular hypertension, On Medications + angina Normal cardiovascular exam     Neuro/Psych  Headaches,    GI/Hepatic negative GI ROS, Neg liver ROS,   Endo/Other  diabetes, Type 2Hyperthyroidism   Renal/GU negative Renal ROS     Musculoskeletal  (+) Arthritis ,   Abdominal   Peds  Hematology negative hematology ROS (+)   Anesthesia Other Findings   Reproductive/Obstetrics negative OB ROS                             Anesthesia Physical  Anesthesia Plan  ASA: II  Anesthesia Plan: General   Post-op Pain Management: GA combined w/ Regional for post-op pain   Induction:   PONV Risk Score and Plan: 2 and Ondansetron  Airway Management Planned: Oral ETT  Additional Equipment:   Intra-op Plan:   Post-operative Plan:   Informed Consent: I have reviewed the patients History and Physical, chart, labs and discussed the procedure including the risks, benefits and alternatives for the proposed anesthesia with the patient or authorized representative who has indicated his/her understanding and acceptance.       Plan Discussed with: CRNA  Anesthesia Plan Comments:         Anesthesia Quick Evaluation

## 2020-11-08 NOTE — Progress Notes (Signed)
Just prior to transport to the OR and following L ISB, an abnormal rhythm was noted on the EKG monitor.  Patient was stable and asymptomatic.  12-lead EKG indicated bifascicular block (old) with PAC's.  I walked the EKG to the cardiology office and asked PA Strather to have a look.  She agreed that the EKG seemed benign, with no high-grade block and no significant changes vs. Old EKG's.  Of note, cardiac cath one year ago was completely normal.  Proceeding to OR for general anesthesia.

## 2020-11-08 NOTE — Transfer of Care (Signed)
Immediate Anesthesia Transfer of Care Note  Patient: Willie Howe  Procedure(s) Performed: TOTAL SHOULDER ARTHROPLASTY (Left Shoulder)  Patient Location: PACU  Anesthesia Type:GA combined with regional for post-op pain  Level of Consciousness: awake, alert , oriented and patient cooperative  Airway & Oxygen Therapy: Patient Spontanous Breathing and Patient connected to face mask oxygen  Post-op Assessment: Report given to RN and Post -op Vital signs reviewed and stable  Post vital signs: Reviewed and stable  Last Vitals:  Vitals Value Taken Time  BP 115/55 11/08/20 1146  Temp    Pulse 46 11/08/20 1147  Resp 30 11/08/20 1147  SpO2 98 % 11/08/20 1147  Vitals shown include unvalidated device data.  Last Pain:  Vitals:   11/08/20 0649  TempSrc: Oral  PainSc: 6       Patients Stated Pain Goal: 8 (54/98/26 4158)  Complications: No complications documented.

## 2020-11-08 NOTE — Op Note (Signed)
Orthopaedic Surgery Operative Note (CSN: [id number])  Willie Howe  1961-04-23 Date of Surgery: 11/08/2020   Diagnoses:  Failed left shoulder rotator cuff repair.  Procedure: Left Reverse Shoulder Arthroplasty     Operative Finding Successful completion of the planned procedure.    Post-Op Diagnosis: Same Surgeons:Primary: Mordecai Rasmussen, MD Assistants:  Marquita Palms, MD Location: [facility] OR ROOM 4 Anesthesia: General with interscalene block Antibiotics: Ancef 3 g with local vancomycin 1 g at the surgical site Tourniquet time: * No tourniquets in log * Estimated Blood Loss: 570 cc Complications: None Specimens: None Implants: Implant Name Type Inv. Item Serial No. Manufacturer Lot No. LRB No. Used Action  univers revers modular glenoid system, central screw    [employer] [id number] Left 1 Implanted  univers revers modular glenoid system baseplate     [employer] [id number] Left 1 Implanted  peripheral screw, locking    [employer] [id number] Left 2 Implanted  peripheral screw, locking 5.5 x 71mm     [employer] [id number] Left 1 Implanted  SCREW PERI LOCK 5.5X32 - [id number] Screw SCREW PERI LOCK 5.5X32  [employer] [id number] Left 1 Implanted  GLENOSPHERE 36 +4 LAT/24 - [id number] Joint GLENOSPHERE 36 +4 LAT/24  [employer] [id number] Left 1 Implanted  humeral stem - size 6    [employer] 21.01621 Left 1 Implanted  suture cup 36 neutral    [employer] [id number] Left 1 Implanted  INSERT HUMERAL SMALL - [id number] Shoulder INSERT HUMERAL SMALL  [employer] [id number] Left 1 Implanted  spacer 36, +6    [employer] [id number] Left 1 Implanted    Indications for Surgery:   Willie Howe is a 60 y.o. male who failed rotator cuff repair twice.  He continued to have pain and minimal mobility in his left shoulder.  He was an excellent candidate for reverse shoulder arthroplasty.   Benefits and risks of operative and nonoperative management were discussed prior to surgery with patient/guardian(s) and  informed consent form was completed.  Specific risks including infection, need for additional surgery, stiffness, dislocation, instability, persistent pain, bleeding, fracture of the scapula and more severe complications associated with anesthesia.  He elected to proceed.    Procedure:   The patient was identified properly. Informed consent was obtained and the surgical site was marked. The patient was taken up to suite where general anesthesia was induced.  The patient was positioned beach chair.  The left arm was prepped and draped in the usual sterile fashion.  Timeout was performed before the beginning of the case.  Standard deltopectoral approach was performed with a #10 blade. We dissected down to the subcutaneous tissues and the cephalic vein was taken laterally with the deltoid.  He was well developed and had a very bulky deltoid muscle.  This made retractor placement and visualization difficulty.  The clavipectoral fascia was incised in line with the incision. Deep retractors were placed. The long head of the biceps tendon was identified and there was significant tenosynovitis present.  Tenodesis was performed to the pectoralis tendon with #2 Ethibond. The remaining biceps was followed up into the rotator interval where it was released.  He previously had a biceps tenotomy, so the biceps did not extend into the shoulder.  Nonetheless, the interval was opened and the subscap tendon was identified.  Using bovie electrocautery, a subscapularis tendon peel was completed.  This was tagged with a single #2 fiberwire for control.  The tendon was sequentially release, followed by  the inferior capsule on to the neck of the humerus.  The shoulder was then dislocated.  Previous suture material and implants were identified and removed.  With the exception of some posterior rotator cuff tendons, the superior aspect of the humeral head was completely bare.  We then used a cutting guide to identify the appropriate  height for the cut.  Retractors were placed to protect the soft tissue and we made our head cut.  It was finished with an osteotome and sharp edges were removed with a rongeur.  A head protector was impacted into place with a mallet.   We then turned our attention to the glenoid.  Retractors were placed.  We then released the SGHL with bovie cautery prior to placing a curved mayo at the junction of the anterior glenoid well above the axillary nerve and bluntly dissecting the subscapularis from the capsule.  We then carefully protected the axillary nerve as we gently released the inferior capsule to fully mobilize the subscapularis.  An anterior deltoid retractor was then placed as well as a small Hohmann retractor superiorly. The remaining labrum was removed circumferentially taking great care not to disrupt the posterior capsule.    The glenoid drill guide was placed and used to drill a guide pin in the center, inferior position. The glenoid face was then reamed concentrically over the guide wire. The center hole was drilled over the guidepin in a near anatomic angle of version. Next the glenoid vault was drilled back to a depth of 20 mm.  We tapped and then placed a 20 mm size baseplate with 0 lateralization was selected with a 20 mm length central screw.  The base plate was screwed into the glenoid vault obtaining secure fixation. We next placed superior and inferior locking screws for additional fixation.  Next a 36 + 4 mm glenosphere was selected and impacted onto the baseplate. The center screw was tightened.   We then repositioned the arm to give access to the humeral shaft fragment. We broached starting with a size 5 broach and broaching up to 6 which obtained an appropriate fit.  Cement was not needed.  The trial tray was placed.  No offset was needed.    We trialed with multiple size tray and polyethylene options and selected a 9 mm standard, polyethylene, which provided good stability and range of  motion without excess soft tissue tension. The offset was dialed in to match the normal anatomy. The shoulder was trialed.  There was good ROM in all planes and the shoulder was stable with no inferior translation.   We placed 2 drill holes within the bicipital groove for repair of the subscapularis.  The implant was assembled on the back table and multiple fiberwire sutures were placed through the holes on the implant.  A 36+9 polyethylene liner was impacted onto the stem.  The joint was reduced and thoroughly irrigated with pulsatile lavage. The sutures were then placed through the subscapularis and repaired back to the lesser tuberosity.  The shoulder was then ranged and the subscapularis helped with stability, and the shoulder moved as a single unit.  We irrigated copiously at this point.  Hemostasis was obtained.  1 g of vancomycin powder was placed in and around the shoulder. The deltopectoral interval was reapproximated with #1 Vicryl. The subcutaneous tissues were closed with 2-0 monocryl and the skin was closed with running monocryl.  Steri-strips were placed.    The wounds were cleaned and dried and an Aquacel  dressing was placed. The drapes taken down. The arm was placed into sling with abduction pillow. Patient was awakened, extubated, and transferred to the recovery room in stable condition. There were no intraoperative complications. The sponge, needle, and attention counts were correct at the end of the case.   Post-operative plan:  The patient will be Admitted for over night observation.   DVT prophylaxis Aspirin 81 mg twice daily for 6 weeks.  Cryocuff in place NWB LUE. Referral to PT to begin in 7-10 days.    Pain control with PRN pain medication preferring oral medicines.   Follow up plan will be scheduled in approximately 10-14 days for incision check and XR.

## 2020-11-08 NOTE — Addendum Note (Signed)
Addendum  created 11/08/20 1229 by Myna Bright, CRNA   Charge Capture section accepted, Intraprocedure Event edited, Intraprocedure Staff edited

## 2020-11-09 ENCOUNTER — Encounter (HOSPITAL_COMMUNITY): Payer: Self-pay | Admitting: Orthopedic Surgery

## 2020-11-09 DIAGNOSIS — M75102 Unspecified rotator cuff tear or rupture of left shoulder, not specified as traumatic: Secondary | ICD-10-CM | POA: Diagnosis not present

## 2020-11-09 DIAGNOSIS — Z96653 Presence of artificial knee joint, bilateral: Secondary | ICD-10-CM | POA: Diagnosis not present

## 2020-11-09 DIAGNOSIS — E119 Type 2 diabetes mellitus without complications: Secondary | ICD-10-CM | POA: Diagnosis not present

## 2020-11-09 DIAGNOSIS — Z87891 Personal history of nicotine dependence: Secondary | ICD-10-CM | POA: Diagnosis not present

## 2020-11-09 DIAGNOSIS — Z7984 Long term (current) use of oral hypoglycemic drugs: Secondary | ICD-10-CM | POA: Diagnosis not present

## 2020-11-09 DIAGNOSIS — Z79899 Other long term (current) drug therapy: Secondary | ICD-10-CM | POA: Diagnosis not present

## 2020-11-09 DIAGNOSIS — I10 Essential (primary) hypertension: Secondary | ICD-10-CM | POA: Diagnosis not present

## 2020-11-09 DIAGNOSIS — Z7982 Long term (current) use of aspirin: Secondary | ICD-10-CM | POA: Diagnosis not present

## 2020-11-09 MED ORDER — ONDANSETRON HCL 4 MG PO TABS: 4.0000 mg | ORAL_TABLET | Freq: Three times a day (TID) | ORAL | 0 refills | Status: AC | PRN

## 2020-11-09 MED ORDER — MORPHINE SULFATE (PF) 2 MG/ML IV SOLN
1.0000 mg | INTRAVENOUS | Status: DC | PRN
  Administered 2020-11-09 (×2): 1 mg via INTRAVENOUS
  Filled 2020-11-09 (×2): qty 1

## 2020-11-09 MED ORDER — ASPIRIN EC 81 MG PO TBEC: 81.0000 mg | DELAYED_RELEASE_TABLET | Freq: Two times a day (BID) | ORAL | 0 refills | Status: AC

## 2020-11-09 MED ORDER — OXYCODONE HCL 5 MG PO TABS: 5.0000 mg | ORAL_TABLET | ORAL | 0 refills | Status: DC | PRN

## 2020-11-09 MED ORDER — CELECOXIB 100 MG PO CAPS: 100.0000 mg | ORAL_CAPSULE | Freq: Every day | ORAL | 0 refills | Status: AC

## 2020-11-09 MED ORDER — ACETAMINOPHEN 500 MG PO TABS: 1000.0000 mg | ORAL_TABLET | Freq: Three times a day (TID) | ORAL | 0 refills | Status: AC

## 2020-11-09 NOTE — Discharge Summary (Signed)
Patient ID: Willie Howe MRN: [id number] DOB/AGE: 62-Jan-1962 62 y.o.  Admit date: 11/08/2020 Discharge date: 11/09/2020  Admission Diagnoses: left shoulder rotator cuff arthropathy; failed rotator cuff repair  Discharge Diagnoses:  Active Problems:   Rotator cuff arthropathy of left shoulder   Past Medical History:  Diagnosis Date  . BPPV (benign paroxysmal positional vertigo)   . DVT (deep venous thrombosis) ([place])   . Graves disease 1996  . History of kidney stones   . History of pulmonary embolism   . Hypertension   . Obesity   . Rheumatoid arthritis ([place])   . Sleep apnea    Intolerant of CPAP  . Type 2 diabetes mellitus ([place])      Procedures Performed: Left reverse shoulder arthroplasty  Discharged Condition: good  Hospital Course: Patient brought in as an outpatient for surgery.  Tolerated procedure well.  Was kept for monitoring overnight for pain control and medical monitoring postop and was found to be stable for DC home the morning after surgery.  Patient was instructed on specific activity restrictions and all questions were answered.   Consults: None  Significant Diagnostic Studies: No additional pertinent studies  Treatments: Surgery  Discharge Exam:  Vitals:   11/09/20 0114 11/09/20 0541  BP: 100/65 136/77  Pulse: (!) 54 (!) 47  Resp: 20 20  Temp: 98.6 F (37 C) 99.6 F (37.6 C)  SpO2: 99% 97%     Alert and oriented, some pain.  Left arm is in sling.   Dressing was changed over night.  It is clean, dry and intact this morning.  Sensation to LUE is intact with the exception of the distal index finger and thumb.  Sensation intact over the axillary patch.  Fingers are warm and well perfused  He is complaining of some pain when urinating,  No blood in urine.   Disposition: Discharge disposition: 01-Home or Self Care        Allergies as of 11/09/2020      Reactions   Statins Other (See Comments)   Muscle pain   Lisinopril Cough       Medication List    STOP taking these medications   HYDROcodone-acetaminophen 5-325 MG tablet Commonly known as: NORCO/VICODIN   HYDROcodone-acetaminophen 7.5-325 MG tablet Commonly known as: NORCO   ibuprofen 800 MG tablet Commonly known as: ADVIL     TAKE these medications   acetaminophen 500 MG tablet Commonly known as: TYLENOL Take 2 tablets (1,000 mg total) by mouth every 8 (eight) hours for 14 days. What changed:   when to take this  reasons to take this   aspirin EC 81 MG tablet Take 1 tablet (81 mg total) by mouth in the morning and at bedtime. Swallow whole. What changed:   when to take this  additional instructions   celecoxib 100 MG capsule Commonly known as: CeleBREX Take 1 capsule (100 mg total) by mouth daily for 14 days.   docusate sodium 100 MG capsule Commonly known as: COLACE Take 200 mg by mouth every other day. At night   folic acid 1 MG tablet Commonly known as: FOLVITE Take 1 mg by mouth daily.   loratadine 10 MG tablet Commonly known as: CLARITIN Take 10 mg by mouth daily.   losartan 100 MG tablet Commonly known as: COZAAR Take 0.5 tablets (50 mg total) by mouth daily in the afternoon.   metFORMIN 500 MG 24 hr tablet Commonly known as: GLUCOPHAGE-XR Take 1 tablet (500 mg total) by mouth daily  with breakfast. What changed: when to take this   methotrexate 50 MG/2ML injection Inject 200 mg into the muscle every Friday.   ondansetron 4 MG tablet Commonly known as: Zofran Take 1 tablet (4 mg total) by mouth every 8 (eight) hours as needed for up to 14 days for nausea or vomiting.   oxyCODONE 5 MG immediate release tablet Commonly known as: Roxicodone Take 1 tablet (5 mg total) by mouth every 4 (four) hours as needed for up to 7 days.   predniSONE 5 MG tablet Commonly known as: DELTASONE Take 5 mg by mouth daily.   RENFLEXIS IV Inject 1 Dose into the vein every 6 (six) weeks.   tamsulosin 0.4 MG Caps capsule Commonly known  as: FLOMAX Take 0.4 mg by mouth at bedtime.       Follow-up Information    Schedule an appointment as soon as possible for a visit with Mordecai Rasmussen, MD.   Specialties: Orthopedic Surgery, Sports Medicine Why: 10-14 days for suture removal/incision check Contact information: [address] [address] [phone]

## 2020-11-09 NOTE — Progress Notes (Signed)
Notified the MD that even after reinforcing dressing patient is still bleeding an even more moderate amount of blood threw his dressing. Patient has saturated the dressing that was placed as a reinforcement. No new orders obtained at this time.

## 2020-11-09 NOTE — Progress Notes (Signed)
MD ordered to remove surgical dressing and apply surgicel, 4x4 and ABD ,and medipore tape.

## 2020-11-09 NOTE — Progress Notes (Signed)
Patient ID: Willie Howe, male   DOB: 1960-12-02, 60 y.o.   MRN: [id number] Nurse called 2 am due to sanguinous drainage left shoulder s/o REV TSA BP 136/77 (BP Location: Right Arm)   Pulse (!) 47   Temp 99.6 F (37.6 C) (Oral)   Resp 20   Ht 6\' 3"  (1.905 m)   Wt (!) 137 kg   SpO2 97%   BMI 37.75 kg/m   I advised her to remove the surgical dressing and   Apply surgicel  4 x 4 s abd s  [place] tape   This morning the dressing is dry without any drainage.  He can move his fingers well and his senstion is intact except for the fingertips of his thumb and index finger  He s stable , he says his pain in severe (opioid tolerance due to 2 yrs of norco for 2 rotator cuff repairs which failed )

## 2020-11-13 ENCOUNTER — Other Ambulatory Visit: Payer: Self-pay | Admitting: Orthopedic Surgery

## 2020-11-13 DIAGNOSIS — S46012A Strain of muscle(s) and tendon(s) of the rotator cuff of left shoulder, initial encounter: Secondary | ICD-10-CM

## 2020-11-14 ENCOUNTER — Other Ambulatory Visit: Payer: Self-pay

## 2020-11-15 MED ORDER — OXYCODONE HCL 5 MG PO TABS: 5.0000 mg | ORAL_TABLET | ORAL | 0 refills | Status: DC | PRN

## 2020-11-19 ENCOUNTER — Ambulatory Visit: Payer: Medicare HMO | Attending: Orthopedic Surgery | Admitting: Physical Therapy

## 2020-11-19 ENCOUNTER — Other Ambulatory Visit: Payer: Self-pay

## 2020-11-19 ENCOUNTER — Encounter: Payer: Self-pay | Admitting: Physical Therapy

## 2020-11-19 DIAGNOSIS — M6281 Muscle weakness (generalized): Secondary | ICD-10-CM

## 2020-11-19 DIAGNOSIS — M25512 Pain in left shoulder: Secondary | ICD-10-CM | POA: Insufficient documentation

## 2020-11-19 DIAGNOSIS — M25612 Stiffness of left shoulder, not elsewhere classified: Secondary | ICD-10-CM | POA: Diagnosis not present

## 2020-11-19 NOTE — Therapy (Signed)
[facility] [place], [place], [address] Phone: [phone]   Fax:  [phone]  Physical Therapy Evaluation  Patient Details  Name: Willie Howe MRN: [id number] Date of Birth: [birth year] Referring Provider (PT): Larena Glassman, MD   Encounter Date: 11/19/2020   PT End of Session - 11/19/20 0919    Visit Number 1    Number of Visits 16    Date for PT Re-Evaluation 12/31/20    Authorization Type [employer] Medicare (CQ and KX modifier); FOTO initial 96% limitation; progress note every 10th visit    PT Start Time 0815    PT Stop Time 0900    PT Time Calculation (min) 45 min    Equipment Utilized During Treatment Other (comment)   left sling   Activity Tolerance Patient tolerated treatment well    Behavior During Therapy WFL for tasks assessed/performed           Past Medical History:  Diagnosis Date  . BPPV (benign paroxysmal positional vertigo)   . DVT (deep venous thrombosis) ([place])   . Graves disease 1996  . History of kidney stones   . History of pulmonary embolism   . Hypertension   . Obesity   . Rheumatoid arthritis ([place])   . Sleep apnea    Intolerant of CPAP  . Type 2 diabetes mellitus ([place])     Past Surgical History:  Procedure Laterality Date  . BACK SURGERY  2015  . COLONOSCOPY    . EXAM UNDER ANESTHESIA WITH MANIPULATION OF KNEE Right 04/18/2015   Procedure: MANIPULATION OF RIGHT KNEE UNDER ANESTHESIA;  Surgeon: Carole Civil, MD;  Location: [facility];  Service: Orthopedics;  Laterality: Right;  . IVC Filter     removed December 2016  . [place]  2010  . KNEE ARTHROSCOPY Right 2006   meniscus   . KNEE ARTHROSCOPY WITH MEDIAL MENISECTOMY Left 04/24/2016   Procedure: LEFT KNEE ARTHROSCOPY WITH PARTIAL MEDIAL MENISECTOMY;  Surgeon: Carole Civil, MD;  Location: [facility];  Service: Orthopedics;  Laterality: Left;  . KNEE SURGERY     x 2  . KNEE SURGERY Left 1976   ?ligament repair   . LEFT  HEART CATH AND CORONARY ANGIOGRAPHY N/A 11/29/2019   Procedure: LEFT HEART CATH AND CORONARY ANGIOGRAPHY;  Surgeon: Martinique, Peter M, MD;  Location: [facility];  Service: Cardiovascular;  Laterality: N/A;  . LUMBAR WOUND DEBRIDEMENT N/A 02/23/2014   Procedure: LUMBAR WOUND DEBRIDEMENT;  Surgeon: Faythe Ghee, MD;  Location: [facility];  Service: Neurosurgery;  Laterality: N/A;  exploration lumbar wound. repair of dural defect.  Marland Kitchen MENISECTOMY Right    open medial   . NEPHROLITHOTOMY    . PERIPHERAL VASCULAR CATHETERIZATION N/A 01/30/2015   Procedure: IVC Filter Insertion;  Surgeon: Serafina Mitchell, MD;  Location: [facility];  Service: Cardiovascular;  Laterality: N/A;  . PERIPHERAL VASCULAR CATHETERIZATION N/A 04/24/2015   Procedure: IVC Filter Removal;  Surgeon: Serafina Mitchell, MD;  Location: [facility];  Service: Cardiovascular;  Laterality: N/A;  . PERIPHERAL VASCULAR CATHETERIZATION N/A 08/14/2015   Procedure: IVC Filter Removal;  Surgeon: Serafina Mitchell, MD;  Location: [facility];  Service: Cardiovascular;  Laterality: N/A;  . SHOULDER ARTHROSCOPY WITH OPEN ROTATOR CUFF REPAIR Left 11/10/2019   Procedure: SHOULDER ARTHROSCOPY WITH OPEN ROTATOR CUFF REPAIR;  Surgeon: Carole Civil, MD;  Location: [facility];  Service: Orthopedics;  Laterality: Left;  pt tested +  on 2/23, does not need Covid test < 90 days  . SHOULDER ARTHROSCOPY WITH ROTATOR CUFF REPAIR AND OPEN BICEPS TENODESIS Left 02/15/2019   Procedure: SHOULDER ARTHROSCOPY WITH open ROTATOR CUFF REPAIR AND OPEN BICEPS TENODESIS;  Surgeon: Carole Civil, MD;  Location: [facility];  Service: Orthopedics;  Laterality: Left;  . SPINE SURGERY  2015   Dr Hal Neer Fusion   . SPINE SURGERY  1999   discectomy  . TOTAL KNEE ARTHROPLASTY Right 02/02/2015   Procedure:  RIGHT TOTAL KNEE ARTHROPLASTY;  Surgeon: Carole Civil, MD;  Location: [facility];  Service: Orthopedics;  Laterality: Right;  LM with pt's daughter of new  arrival time (10:45)    . TOTAL KNEE ARTHROPLASTY Left 07/24/2016   Procedure: TOTAL KNEE ARTHROPLASTY;  Surgeon: Carole Civil, MD;  Location: [facility];  Service: Orthopedics;  Laterality: Left;  . TOTAL SHOULDER ARTHROPLASTY Left 11/08/2020   Procedure: TOTAL SHOULDER ARTHROPLASTY;  Surgeon: Mordecai Rasmussen, MD;  Location: [facility];  Service: Orthopedics;  Laterality: Left;  Left shoulder Reverse Arthroplasty    There were no vitals filed for this visit.    Subjective Assessment - 11/19/20 0909    Subjective COVID-19 screening performed upon arrival. Patient arrives to physical therapy with reports of left shoulder pain secondary to a left reverse TSA on 11/08/2020. Patient gains assistance for dressing and showering activities from his wife. Patient has been performing hand gripping exercises and elbow flexion in sling with R hand assistance for HEP. Patient reports pain at worst as 9/10 and pain at best as 4/10 with pain medication, rest and getting in the right position. Patient's goals are to decrease pain, improve movement, and improve ability to perform all ADLs and home activities independently.    Pertinent History Left reverse total shoulder replacement 11/08/2020, HTN, DM, history of bilateral TKA, history of spinal surgery    Limitations Lifting;House hold activities    Patient Stated Goals move left arm better    Currently in Pain? Yes    Pain Score 6     Pain Location Shoulder    Pain Orientation Left    Pain Descriptors / Indicators Sharp    Pain Type Surgical pain    Pain Onset 1 to 4 weeks ago    Pain Frequency Constant    Aggravating Factors  movement    Pain Relieving Factors pain medication, rest, getting in the right position    Effect of Pain on Daily Activities needs assistance for ADLs              [facility] PT Assessment - 11/19/20 0001      Assessment   Medical Diagnosis Chronic left shoulder pain; S/P L reverse total shoulder arthroplasty    Referring Provider  (PT) Larena Glassman, MD    Onset Date/Surgical Date 11/08/20    Hand Dominance Right    Next MD Visit 11/20/2020    Prior Therapy yes      Precautions   Precautions Shoulder    Type of Shoulder Precautions See Dr. Amedeo Kinsman' reverse TSA protocol      Restrictions   Weight Bearing Restrictions Yes    LUE Weight Bearing Non weight bearing      Balance Screen   Has the patient fallen in the past 6 months No    Has the patient had a decrease in activity level because of a fear of falling?  No    Is the patient reluctant to leave their home because of  a fear of falling?  No      Home Environment   Living Environment Private residence    Living Arrangements Spouse/significant other      Prior Function   Level of Independence Needs assistance with ADLs;Needs assistance with homemaking      Observation/Other Assessments   Skin Integrity L incision dressed with gauze and tape      Observation/Other Assessments-Edema    Edema --   moderate edema observed in L shoulder     ROM / Strength   AROM / PROM / Strength PROM      PROM   Overall PROM  Deficits;Due to pain    PROM Assessment Site Shoulder    Right/Left Shoulder Left    Left Shoulder Flexion 30 Degrees    Left Shoulder ABduction 26 Degrees    Left Shoulder External Rotation -14 Degrees      Palpation   Palpation comment slight tenderness to medial upper arm, minimal tone palpated in L UT      Transfers   Comments able to perform supine<>sit transfers with supervision/                      Objective measurements completed on examination: See above findings.       [place] Adult PT Treatment/Exercise - 11/19/20 0001      Modalities   Modalities Vasopneumatic      Vasopneumatic   Number Minutes Vasopneumatic  15 minutes    Vasopnuematic Location  Shoulder    Vasopneumatic Pressure Low    Vasopneumatic Temperature  34 for pain and edema                  PT Education - 11/19/20 0919    Education  Details review of protocol and plan of care.    Person(s) Educated Patient    Methods Explanation    Comprehension Verbalized understanding            PT Short Term Goals - 11/19/20 0928      PT SHORT TERM GOAL #1   Title STG's=LTG's             PT Long Term Goals - 11/19/20 4163      PT LONG TERM GOAL #1   Title Patient will be independent with HEP and its progression.    Time 10    Period Weeks    Status New      PT LONG TERM GOAL #2   Title Patient will demonstrate 140+ degrees of left shoulder flexion AROM to improve overhead activities.    Time 10    Period Weeks    Status New      PT LONG TERM GOAL #3   Title Patient will demonstrate 55+ degrees of left shoulder ER AROM to improve donning/doffing apparel.    Baseline --    Time 10    Period Weeks    Status New      PT LONG TERM GOAL #4   Title Patient will report ability to perform ADLs with left shoulder pain less than or equal to 4/10.    Time 10    Period Weeks    Status New      PT LONG TERM GOAL #5   Title Patient will demonstrate 4+/5  left shoulder MMT to improve stability during functional tasks.    Time 10    Period Weeks    Status New  Plan - 11/19/20 4193    Clinical Impression Statement Patient is a 60 year old right handed male who presents to physical therapy with left shoulder pain and  decreased L ROM secondary to a left reverse total shoulder arthroplasty on 11/08/2020. Patient able to doff sling independently but requires A for donning. Patient able to safely transfer sit<>supine with supervision. Patient's incision is donned with gauze and tape; mild areas of ecchymosis noted. L UT with minimal tone upon palpation. Patient and PT discussed plan of care and discussed protocol for reverse TSA to which patient reported understanding. Patient instructed to continue HEP provided by MD at this time. Patient would benefit from skilled physical therapy to address deficits  and address patient's goals.    Personal Factors and Comorbidities Comorbidity 3+;Time since onset of injury/illness/exacerbation;Age    Comorbidities Left reverse total shoulder replacement 11/08/2020, HTN, DM, history of bilateral TKA, history of spinal surgery    Examination-Activity Limitations Sleep;Caring for Others;Reach Overhead;Dressing;Hygiene/Grooming;Bed Mobility    Stability/Clinical Decision Making Stable/Uncomplicated    Clinical Decision Making Low    Rehab Potential Good    PT Frequency 2x / week   1x per week for first 6 weeks then progress to 2x/week   PT Duration 8 weeks    PT Treatment/Interventions ADLs/Self Care Home Management;Cryotherapy;Electrical Stimulation;Moist Heat;Functional mobility training;Therapeutic activities;Therapeutic exercise;Neuromuscular re-education;Manual techniques;Passive range of motion;Patient/family education;Vasopneumatic Device;Taping    PT Next Visit Plan PROM to left shoulder for first 4-6 weeks then progress per protocol; modalities PRN for pain relief; NO IONTOPHORESIS PER AETNA    PT Home Exercise Plan continue HEP provided by MD    Consulted and Agree with Plan of Care Patient           Patient will benefit from skilled therapeutic intervention in order to improve the following deficits and impairments:  Decreased activity tolerance,Decreased strength,Increased edema,Postural dysfunction,Pain,Decreased range of motion,Impaired UE functional use  Visit Diagnosis: Stiffness of left shoulder, not elsewhere classified - Plan: PT plan of care cert/re-cert  Acute pain of left shoulder - Plan: PT plan of care cert/re-cert  Muscle weakness (generalized) - Plan: PT plan of care cert/re-cert     Problem List Patient Active Problem List   Diagnosis Date Noted  . Rotator cuff arthropathy of left shoulder 11/08/2020  . Abnormal feces 08/30/2020  . Chronic idiopathic constipation 08/30/2020  . Colon cancer screening 08/30/2020  . Family  history of malignant neoplasm of gastrointestinal tract 08/30/2020  . Personal history of colonic polyps 08/30/2020  . Chest pain 11/28/2019  . Symptomatic bradycardia 11/28/2019  . Nontraumatic complete tear of left rotator cuff   . Hypertension 08/31/2019  . S/P left rotator cuff repair/ biceps tenodesis 02/15/19  03/01/2019  . Labral tear of long head of left biceps tendon   . Partial tear of left subscapularis tendon   . Traumatic complete tear of left rotator cuff   . Family history of malignant neoplasm of prostate 07/28/2018  . History of Graves' disease 11/11/2017  . S/P total knee replacement, right 02/02/15 08/12/2017  . S/P total knee replacement, left 07/24/16 08/12/2017  . Hypercholesterolemia 11/08/2015  . History of DVT (deep vein thrombosis) 11/04/2015  . History of cocaine abuse ([place]) 11/04/2015  . History of heroin abuse ([place]) 11/04/2015  . Obesity 11/04/2015  . Parasomnia 11/04/2015  . Benign paroxysmal positional vertigo 10/29/2015  . Controlled type 2 diabetes mellitus without complication ([place]) 79/09/4095  . Elevated prostate specific antigen (PSA) 10/29/2015  . HX: long term  anticoagulant use 10/29/2015  . Sensorineural hearing loss 10/29/2015  . Sleep apnea with use of continuous positive airway pressure (CPAP) 10/29/2015  . Chronic radicular lumbar pain 10/29/2015  . Arthrofibrosis of total knee arthroplasty ([place])   . Pulmonary embolus ([place]) 01/09/2015  . History of pulmonary embolism 01/09/2015  . UTI (lower urinary tract infection) 02/21/2014  . Other headache syndrome 02/21/2014  . Nausea with vomiting 02/21/2014  . Rheumatoid arthritis ([place]) 02/21/2014  . Nausea & vomiting 02/21/2014  . Headache(784.0) 02/21/2014  . Lumbar spinal stenosis 02/10/2014    Gabriela Eves, PT, DPT 11/19/2020, 9:37 AM  [facility] [place], [place], [address] Phone: [phone]   Fax:  [phone]  Name: Willie Howe MRN: [id number] Date of Birth: Feb 02, 1961

## 2020-11-21 ENCOUNTER — Ambulatory Visit (INDEPENDENT_AMBULATORY_CARE_PROVIDER_SITE_OTHER): Payer: Medicare HMO | Admitting: Orthopedic Surgery

## 2020-11-21 ENCOUNTER — Encounter: Payer: Self-pay | Admitting: Orthopedic Surgery

## 2020-11-21 ENCOUNTER — Ambulatory Visit: Payer: Medicare HMO

## 2020-11-21 ENCOUNTER — Other Ambulatory Visit: Payer: Self-pay

## 2020-11-21 DIAGNOSIS — M12812 Other specific arthropathies, not elsewhere classified, left shoulder: Secondary | ICD-10-CM

## 2020-11-21 DIAGNOSIS — G8929 Other chronic pain: Secondary | ICD-10-CM

## 2020-11-21 DIAGNOSIS — M75102 Unspecified rotator cuff tear or rupture of left shoulder, not specified as traumatic: Secondary | ICD-10-CM

## 2020-11-21 DIAGNOSIS — Z96612 Presence of left artificial shoulder joint: Secondary | ICD-10-CM

## 2020-11-21 DIAGNOSIS — M25512 Pain in left shoulder: Secondary | ICD-10-CM

## 2020-11-21 NOTE — Progress Notes (Signed)
Orthopaedic Postop Note  Assessment: Willie Howe is a 60 y.o. male s/p Left Reverse Shoulder Arthroplasty  DOS: 11/08/2020  Plan: Sutures were trimmed, steri strips were placed Ok to remove the abduction pillow; can stop using the sling around 4 weeks postop Physical therapy prescription and protocol provided; he has his 2nd scheduled appointment next Monday  Anticipated progression discussed, XR reviewed in clinic Follow up 4 weeks   Follow-up: Return in about 4 weeks (around 12/19/2020).  XR at next visit: Left shoulder  Subjective:  Chief Complaint  Patient presents with  . Shoulder Pain    S/p left shoulder 11/08/20,    History of Present Illness: Willie Howe is a 60 y.o. male who presents following the above stated procedure.  He is now approximately 2 weeks out from surgery.  His pain continues to improve.  He has been evaluated by physical therapy, and has neck scheduled appointment next week.  He notes that he is taking less pain medication, and does not always need an extra dose through the night.  His sleep has improved, and he is using a recliner.  He denies numbness and tingling.  He is happy to get the surgical dressing off.  Review of Systems: No fevers or chills No numbness or tingling No Chest Pain No shortness of breath   Objective: There were no vitals taken for this visit.  Physical Exam:  Alert and oriented, no acute distress.  Left anterior shoulder incision is healing well without surrounding erythema or drainage.  He has intact sensation over the axillary patch.  The deltoid fires.  No numbness or tingling in his forearm.  Sensation is intact distally in the M/U/R nerve distribution.  Active motion intact in the AIN/PIN/U nerve distribution.  Fingers are warm and well perfused.  IMAGING: I personally ordered and reviewed the following images:  XR of the Left obtained in clinic today and demonstrates shoulder arthroplasty with implants in  good position.  No evidence of acute injury or subsidence of implants.   Impression: Left shoulder reverse arthroplasty in good position   Mordecai Rasmussen, MD 11/21/2020 11:46 AM

## 2020-11-22 ENCOUNTER — Other Ambulatory Visit: Payer: Self-pay

## 2020-11-22 DIAGNOSIS — M0589 Other rheumatoid arthritis with rheumatoid factor of multiple sites: Secondary | ICD-10-CM | POA: Diagnosis not present

## 2020-11-22 MED ORDER — OXYCODONE HCL 5 MG PO TABS: 5.0000 mg | ORAL_TABLET | ORAL | 0 refills | Status: DC | PRN

## 2020-11-26 ENCOUNTER — Ambulatory Visit: Payer: Medicare HMO | Admitting: Physical Therapy

## 2020-11-26 ENCOUNTER — Other Ambulatory Visit: Payer: Self-pay

## 2020-11-26 ENCOUNTER — Encounter: Payer: Self-pay | Admitting: Physical Therapy

## 2020-11-26 DIAGNOSIS — M6281 Muscle weakness (generalized): Secondary | ICD-10-CM | POA: Diagnosis not present

## 2020-11-26 DIAGNOSIS — M25512 Pain in left shoulder: Secondary | ICD-10-CM | POA: Diagnosis not present

## 2020-11-26 DIAGNOSIS — M25612 Stiffness of left shoulder, not elsewhere classified: Secondary | ICD-10-CM | POA: Diagnosis not present

## 2020-11-26 NOTE — Therapy (Signed)
[facility] [place], [place], [address] Phone: [phone]   Fax:  [phone]  Physical Therapy Treatment  Patient Details  Name: Willie Howe MRN: [id number] Date of Birth: May 05, 1961 Referring Provider (PT): Larena Glassman, MD   Encounter Date: 11/26/2020   PT End of Session - 11/26/20 0821    Visit Number 2    Number of Visits 16    Date for PT Re-Evaluation 12/31/20    Authorization Type Aetna Medicare (CQ and KX modifier); FOTO initial 96% limitation; progress note every 10th visit    PT Start Time 0821    PT Stop Time 0901    PT Time Calculation (min) 40 min    Equipment Utilized During Treatment Other (comment)   sling   Activity Tolerance Patient tolerated treatment well    Behavior During Therapy [facility] for tasks assessed/performed           Past Medical History:  Diagnosis Date  . BPPV (benign paroxysmal positional vertigo)   . DVT (deep venous thrombosis) ([place])   . Graves disease 1996  . History of kidney stones   . History of pulmonary embolism   . Hypertension   . Obesity   . Rheumatoid arthritis ([place])   . Sleep apnea    Intolerant of CPAP  . Type 2 diabetes mellitus ([place])     Past Surgical History:  Procedure Laterality Date  . BACK SURGERY  2015  . COLONOSCOPY    . EXAM UNDER ANESTHESIA WITH MANIPULATION OF KNEE Right 04/18/2015   Procedure: MANIPULATION OF RIGHT KNEE UNDER ANESTHESIA;  Surgeon: Carole Civil, MD;  [facility]: [facility];  Service: Orthopedics;  Laterality: Right;  . IVC Filter     removed December 2016  . [place]  2010  . KNEE ARTHROSCOPY Right 2006   meniscus   . KNEE ARTHROSCOPY WITH MEDIAL MENISECTOMY Left 04/24/2016   Procedure: LEFT KNEE ARTHROSCOPY WITH PARTIAL MEDIAL MENISECTOMY;  Surgeon: Carole Civil, MD;  [facility]: [facility];  Service: Orthopedics;  Laterality: Left;  . KNEE SURGERY     x 2  . KNEE SURGERY Left 1976   ?ligament repair   . LEFT  HEART CATH AND CORONARY ANGIOGRAPHY N/A 11/29/2019   Procedure: LEFT HEART CATH AND CORONARY ANGIOGRAPHY;  Surgeon: Martinique, Peter M, MD;  Location: [place] [facility];  Service: Cardiovascular;  Laterality: N/A;  . LUMBAR WOUND DEBRIDEMENT N/A 02/23/2014   Procedure: LUMBAR WOUND DEBRIDEMENT;  Surgeon: Faythe Ghee, MD;  Location: [place];  Service: Neurosurgery;  Laterality: N/A;  exploration lumbar wound. repair of dural defect.  Marland Kitchen MENISECTOMY Right    open medial   . NEPHROLITHOTOMY    . PERIPHERAL VASCULAR CATHETERIZATION N/A 01/30/2015   Procedure: IVC Filter Insertion;  Surgeon: Serafina Mitchell, MD;  Location: [place] [facility];  Service: Cardiovascular;  Laterality: N/A;  . PERIPHERAL VASCULAR CATHETERIZATION N/A 04/24/2015   Procedure: IVC Filter Removal;  Surgeon: Serafina Mitchell, MD;  Location: [place] [facility];  Service: Cardiovascular;  Laterality: N/A;  . PERIPHERAL VASCULAR CATHETERIZATION N/A 08/14/2015   Procedure: IVC Filter Removal;  Surgeon: Serafina Mitchell, MD;  Location: [place] [facility];  Service: Cardiovascular;  Laterality: N/A;  . SHOULDER ARTHROSCOPY WITH OPEN ROTATOR CUFF REPAIR Left 11/10/2019   Procedure: SHOULDER ARTHROSCOPY WITH OPEN ROTATOR CUFF REPAIR;  Surgeon: Carole Civil, MD;  [facility];  Service: Orthopedics;  Laterality: Left;  pt tested +  on 2/23, does not need Covid test < 90 days  . SHOULDER ARTHROSCOPY WITH ROTATOR CUFF REPAIR AND OPEN BICEPS TENODESIS Left 02/15/2019   Procedure: SHOULDER ARTHROSCOPY WITH open ROTATOR CUFF REPAIR AND OPEN BICEPS TENODESIS;  Surgeon: Carole Civil, MD;  [facility];  Service: Orthopedics;  Laterality: Left;  . SPINE SURGERY  2015   Dr Hal Neer Fusion   . SPINE SURGERY  1999   discectomy  . TOTAL KNEE ARTHROPLASTY Right 02/02/2015   Procedure:  RIGHT TOTAL KNEE ARTHROPLASTY;  Surgeon: Carole Civil, MD;  [facility];  Service: Orthopedics;  Laterality: Right;  LM with pt's daughter of new  arrival time (10:45)    . TOTAL KNEE ARTHROPLASTY Left 07/24/2016   Procedure: TOTAL KNEE ARTHROPLASTY;  Surgeon: Carole Civil, MD;  [facility];  Service: Orthopedics;  Laterality: Left;  . TOTAL SHOULDER ARTHROPLASTY Left 11/08/2020   Procedure: TOTAL SHOULDER ARTHROPLASTY;  Surgeon: Mordecai Rasmussen, MD;  [facility];  Service: Orthopedics;  Laterality: Left;  Left shoulder Reverse Arthroplasty    There were no vitals filed for this visit.   Subjective Assessment - 11/26/20 0820    Subjective COVID-19 screening performed upon arrival. Reports that he thinks he may have slept wrong on it as he said he felt something pop. Took pain meds 30 minutes ago.    Pertinent History Left reverse total shoulder replacement 11/08/2020, HTN, DM, history of bilateral TKA, history of spinal surgery    Limitations Lifting;House hold activities    Patient Stated Goals move left arm better    Currently in Pain? Yes    Pain Score 6     Pain Location Shoulder    Pain Orientation Left    Pain Descriptors / Indicators Discomfort    Pain Type Surgical pain    Pain Onset 1 to 4 weeks ago    Pain Frequency Constant              [facility] PT Assessment - 11/26/20 0001      Assessment   Medical Diagnosis Chronic left shoulder pain; S/P L reverse total shoulder arthroplasty    Referring Provider (PT) Larena Glassman, MD    Onset Date/Surgical Date 11/08/20    Hand Dominance Right    Next MD Visit 11/20/2020    Prior Therapy yes      Precautions   Precautions Shoulder    Type of Shoulder Precautions See Dr. Amedeo Kinsman' reverse TSA protocol                         [facility] Adult PT Treatment/Exercise - 11/26/20 0001      Modalities   Modalities Vasopneumatic      Vasopneumatic   Number Minutes Vasopneumatic  15 minutes    Vasopnuematic Location  Shoulder    Vasopneumatic Pressure Low    Vasopneumatic Temperature  34 for pain and edema      Manual Therapy   Manual Therapy Passive  ROM    Passive ROM PROM of L shoulder into flexion, ER/IR with gentle holds at end range and intermittant oscillations                    PT Short Term Goals - 11/19/20 0928      PT SHORT TERM GOAL #1   Title STG's=LTG's             PT Long Term Goals - 11/19/20 2774  PT LONG TERM GOAL #1   Title Patient will be independent with HEP and its progression.    Time 10    Period Weeks    Status New      PT LONG TERM GOAL #2   Title Patient will demonstrate 140+ degrees of left shoulder flexion AROM to improve overhead activities.    Time 10    Period Weeks    Status New      PT LONG TERM GOAL #3   Title Patient will demonstrate 55+ degrees of left shoulder ER AROM to improve donning/doffing apparel.    Baseline --    Time 10    Period Weeks    Status New      PT LONG TERM GOAL #4   Title Patient will report ability to perform ADLs with left shoulder pain less than or equal to 4/10.    Time 10    Period Weeks    Status New      PT LONG TERM GOAL #5   Title Patient will demonstrate 4+/5  left shoulder MMT to improve stability during functional tasks.    Time 10    Period Weeks    Status New                 Plan - 11/26/20 2725    Clinical Impression Statement Patient presented in clinic with increased pain. Patient reported feeling good over the weekend and was a little more active Saturday and may have slept in a weird position in the recliner. Sling donned upon arrival. Patient required intermittant oscillations to assist with pain during gentle PROM session. Empty end feels at this time due to pain but smooth arc of motion. Intermittant facial grimacing with PROM session at end range flexion. Normal vasopneumatic response noted following removal of the modality.    Personal Factors and Comorbidities Comorbidity 3+;Time since onset of injury/illness/exacerbation;Age    Comorbidities Left reverse total shoulder replacement 11/08/2020, HTN, DM,  history of bilateral TKA, history of spinal surgery    Examination-Activity Limitations Sleep;Caring for Others;Reach Overhead;Dressing;Hygiene/Grooming;Bed Mobility    Stability/Clinical Decision Making Stable/Uncomplicated    Rehab Potential Good    PT Frequency 2x / week    PT Duration 8 weeks    PT Treatment/Interventions ADLs/Self Care Home Management;Cryotherapy;Electrical Stimulation;Moist Heat;Functional mobility training;Therapeutic activities;Therapeutic exercise;Neuromuscular re-education;Manual techniques;Passive range of motion;Patient/family education;Vasopneumatic Device;Taping    PT Next Visit Plan PROM to left shoulder for first 4-6 weeks then progress per protocol; modalities PRN for pain relief; NO IONTOPHORESIS PER AETNA    PT Home Exercise Plan continue HEP provided by MD    Consulted and Agree with Plan of Care Patient           Patient will benefit from skilled therapeutic intervention in order to improve the following deficits and impairments:  Decreased activity tolerance,Decreased strength,Increased edema,Postural dysfunction,Pain,Decreased range of motion,Impaired UE functional use  Visit Diagnosis: Stiffness of left shoulder, not elsewhere classified  Acute pain of left shoulder  Muscle weakness (generalized)     Problem List Patient Active Problem List   Diagnosis Date Noted  . Rotator cuff arthropathy of left shoulder 11/08/2020  . Abnormal feces 08/30/2020  . Chronic idiopathic constipation 08/30/2020  . Colon cancer screening 08/30/2020  . Family history of malignant neoplasm of gastrointestinal tract 08/30/2020  . Personal history of colonic polyps 08/30/2020  . Chest pain 11/28/2019  . Symptomatic bradycardia 11/28/2019  . Nontraumatic complete tear of left rotator cuff   .  Hypertension 08/31/2019  . S/P left rotator cuff repair/ biceps tenodesis 02/15/19  03/01/2019  . Labral tear of long head of left biceps tendon   . Partial tear of left  subscapularis tendon   . Traumatic complete tear of left rotator cuff   . Family history of malignant neoplasm of prostate 07/28/2018  . History of Graves' disease 11/11/2017  . S/P total knee replacement, right 02/02/15 08/12/2017  . S/P total knee replacement, left 07/24/16 08/12/2017  . Hypercholesterolemia 11/08/2015  . History of DVT (deep vein thrombosis) 11/04/2015  . History of cocaine abuse ([place]) 11/04/2015  . History of heroin abuse ([place]) 11/04/2015  . Obesity 11/04/2015  . Parasomnia 11/04/2015  . Benign paroxysmal positional vertigo 10/29/2015  . Controlled type 2 diabetes mellitus without complication ([place]) 20/25/4270  . Elevated prostate specific antigen (PSA) 10/29/2015  . HX: long term anticoagulant use 10/29/2015  . Sensorineural hearing loss 10/29/2015  . Sleep apnea with use of continuous positive airway pressure (CPAP) 10/29/2015  . Chronic radicular lumbar pain 10/29/2015  . Arthrofibrosis of total knee arthroplasty ([place])   . Pulmonary embolus ([place]) 01/09/2015  . History of pulmonary embolism 01/09/2015  . UTI (lower urinary tract infection) 02/21/2014  . Other headache syndrome 02/21/2014  . Nausea with vomiting 02/21/2014  . Rheumatoid arthritis ([place]) 02/21/2014  . Nausea & vomiting 02/21/2014  . Headache(784.0) 02/21/2014  . Lumbar spinal stenosis 02/10/2014    Standley Brooking, PTA 11/26/2020, 9:12 AM  [facility] [place], [place], [address] Phone: [phone]   Fax:  [phone]  Name: Willie Howe MRN: [id number] Date of Birth: 26-Mar-1961

## 2020-11-29 ENCOUNTER — Other Ambulatory Visit: Payer: Self-pay

## 2020-11-29 MED ORDER — OXYCODONE HCL 5 MG PO TABS: 5.0000 mg | ORAL_TABLET | Freq: Four times a day (QID) | ORAL | 0 refills | Status: DC | PRN

## 2020-12-03 ENCOUNTER — Encounter: Payer: Self-pay | Admitting: Physical Therapy

## 2020-12-03 ENCOUNTER — Ambulatory Visit: Payer: Medicare HMO | Admitting: Physical Therapy

## 2020-12-03 ENCOUNTER — Other Ambulatory Visit: Payer: Self-pay

## 2020-12-03 DIAGNOSIS — M25512 Pain in left shoulder: Secondary | ICD-10-CM

## 2020-12-03 DIAGNOSIS — M25612 Stiffness of left shoulder, not elsewhere classified: Secondary | ICD-10-CM | POA: Diagnosis not present

## 2020-12-03 DIAGNOSIS — M6281 Muscle weakness (generalized): Secondary | ICD-10-CM | POA: Diagnosis not present

## 2020-12-03 NOTE — Therapy (Signed)
[facility] [place], [place], [address] Phone: [phone]   Fax:  [phone]  Physical Therapy Treatment  Patient Details  Name: Willie Howe MRN: [id number] Date of Birth: 15-Sep-1960 Referring Provider (PT): Larena Glassman, MD   Encounter Date: 12/03/2020   PT End of Session - 12/03/20 1139    Visit Number 3    Number of Visits 16    Date for PT Re-Evaluation 12/31/20    Authorization Type Aetna Medicare (CQ and KX modifier); FOTO initial 96% limitation; progress note every 10th visit    PT Start Time 0818    PT Stop Time 0853    PT Time Calculation (min) 35 min    Equipment Utilized During Treatment Other (comment)   sling   Activity Tolerance Patient tolerated treatment well    Behavior During Therapy [facility] for tasks assessed/performed           Past Medical History:  Diagnosis Date  . BPPV (benign paroxysmal positional vertigo)   . DVT (deep venous thrombosis) ([place])   . Graves disease 1996  . History of kidney stones   . History of pulmonary embolism   . Hypertension   . Obesity   . Rheumatoid arthritis ([place])   . Sleep apnea    Intolerant of CPAP  . Type 2 diabetes mellitus ([place])     Past Surgical History:  Procedure Laterality Date  . BACK SURGERY  2015  . COLONOSCOPY    . EXAM UNDER ANESTHESIA WITH MANIPULATION OF KNEE Right 04/18/2015   Procedure: MANIPULATION OF RIGHT KNEE UNDER ANESTHESIA;  Surgeon: Carole Civil, MD;  [facility];  Service: Orthopedics;  Laterality: Right;  . IVC Filter     removed December 2016  . [place]  2010  . KNEE ARTHROSCOPY Right 2006   meniscus   . KNEE ARTHROSCOPY WITH MEDIAL MENISECTOMY Left 04/24/2016   Procedure: LEFT KNEE ARTHROSCOPY WITH PARTIAL MEDIAL MENISECTOMY;  Surgeon: Carole Civil, MD;  [facility]: [facility];  Service: Orthopedics;  Laterality: Left;  . KNEE SURGERY     x 2  . KNEE SURGERY Left 1976   ?ligament repair   . LEFT  HEART CATH AND CORONARY ANGIOGRAPHY N/A 11/29/2019   Procedure: LEFT HEART CATH AND CORONARY ANGIOGRAPHY;  Surgeon: Martinique, Peter M, MD;  Location: [place] [facility];  Service: Cardiovascular;  Laterality: N/A;  . LUMBAR WOUND DEBRIDEMENT N/A 02/23/2014   Procedure: LUMBAR WOUND DEBRIDEMENT;  Surgeon: Faythe Ghee, MD;  Location: [place];  Service: Neurosurgery;  Laterality: N/A;  exploration lumbar wound. repair of dural defect.  Marland Kitchen MENISECTOMY Right    open medial   . NEPHROLITHOTOMY    . PERIPHERAL VASCULAR CATHETERIZATION N/A 01/30/2015   Procedure: IVC Filter Insertion;  Surgeon: Serafina Mitchell, MD;  Location: [place] [facility];  Service: Cardiovascular;  Laterality: N/A;  . PERIPHERAL VASCULAR CATHETERIZATION N/A 04/24/2015   Procedure: IVC Filter Removal;  Surgeon: Serafina Mitchell, MD;  Location: [place] [facility];  Service: Cardiovascular;  Laterality: N/A;  . PERIPHERAL VASCULAR CATHETERIZATION N/A 08/14/2015   Procedure: IVC Filter Removal;  Surgeon: Serafina Mitchell, MD;  Location: [place] [facility];  Service: Cardiovascular;  Laterality: N/A;  . SHOULDER ARTHROSCOPY WITH OPEN ROTATOR CUFF REPAIR Left 11/10/2019   Procedure: SHOULDER ARTHROSCOPY WITH OPEN ROTATOR CUFF REPAIR;  Surgeon: Carole Civil, MD;  Location: [facility];  Service: Orthopedics;  Laterality: Left;  pt tested +  on 2/23, does not need Covid test < 90 days  . SHOULDER ARTHROSCOPY WITH ROTATOR CUFF REPAIR AND OPEN BICEPS TENODESIS Left 02/15/2019   Procedure: SHOULDER ARTHROSCOPY WITH open ROTATOR CUFF REPAIR AND OPEN BICEPS TENODESIS;  Surgeon: Carole Civil, MD;  [facility];  Service: Orthopedics;  Laterality: Left;  . SPINE SURGERY  2015   Dr Hal Neer Fusion   . SPINE SURGERY  1999   discectomy  . TOTAL KNEE ARTHROPLASTY Right 02/02/2015   Procedure:  RIGHT TOTAL KNEE ARTHROPLASTY;  Surgeon: Carole Civil, MD;  [facility];  Service: Orthopedics;  Laterality: Right;  LM with pt's daughter of new  arrival time (10:45)    . TOTAL KNEE ARTHROPLASTY Left 07/24/2016   Procedure: TOTAL KNEE ARTHROPLASTY;  Surgeon: Carole Civil, MD;  [facility];  Service: Orthopedics;  Laterality: Left;  . TOTAL SHOULDER ARTHROPLASTY Left 11/08/2020   Procedure: TOTAL SHOULDER ARTHROPLASTY;  Surgeon: Mordecai Rasmussen, MD;  [facility];  Service: Orthopedics;  Laterality: Left;  Left shoulder Reverse Arthroplasty    There were no vitals filed for this visit.   Subjective Assessment - 12/03/20 0817    Subjective COVID-19 screening performed upon arrival. Reports using LUE sparingly this weekend which may have caused pain but having to force flowers into foam for an arrangement.    Pertinent History Left reverse total shoulder replacement 11/08/2020, HTN, DM, history of bilateral TKA, history of spinal surgery    Limitations Lifting;House hold activities    Patient Stated Goals move left arm better    Currently in Pain? Yes    Pain Score 5     Pain Location Shoulder    Pain Orientation Left    Pain Descriptors / Indicators Discomfort    Pain Type Surgical pain    Pain Onset 1 to 4 weeks ago    Pain Frequency Constant              [facility] PT Assessment - 12/03/20 0001      Assessment   Medical Diagnosis Chronic left shoulder pain; S/P L reverse total shoulder arthroplasty    Referring Provider (PT) Larena Glassman, MD    Onset Date/Surgical Date 11/08/20    Hand Dominance Right    Prior Therapy yes      Precautions   Precautions Shoulder    Type of Shoulder Precautions See Dr. Amedeo Kinsman' reverse TSA protocol                         [facility] Adult PT Treatment/Exercise - 12/03/20 0001      Modalities   Modalities Vasopneumatic      Vasopneumatic   Number Minutes Vasopneumatic  10 minutes    Vasopnuematic Location  Shoulder    Vasopneumatic Pressure Low    Vasopneumatic Temperature  34 for pain and edema      Manual Therapy   Manual Therapy Passive ROM    Passive ROM  PROM of L shoulder into flexion, ER/IR with gentle holds at end range and intermittant oscillations                    PT Short Term Goals - 11/19/20 0928      PT SHORT TERM GOAL #1   Title STG's=LTG's             PT Long Term Goals - 12/03/20 1139      PT LONG TERM GOAL #1  Title Patient will be independent with HEP and its progression.    Time 10    Period Weeks    Status On-going      PT LONG TERM GOAL #2   Title Patient will demonstrate 140+ degrees of left shoulder flexion AROM to improve overhead activities.    Time 10    Period Weeks    Status On-going      PT LONG TERM GOAL #3   Title Patient will demonstrate 55+ degrees of left shoulder ER AROM to improve donning/doffing apparel.    Baseline --    Time 10    Period Weeks    Status On-going      PT LONG TERM GOAL #4   Title Patient will report ability to perform ADLs with left shoulder pain less than or equal to 4/10.    Time 10    Period Weeks    Status On-going      PT LONG TERM GOAL #5   Title Patient will demonstrate 4+/5  left shoulder MMT to improve stability during functional tasks.    Time 10    Period Weeks    Status On-going                 Plan - 12/03/20 1142    Clinical Impression Statement Patient presented in clinic with reports of discomfort after using his LUE sparingly while arranging flowers into a foam cube. Patient compliant with use of sling upon arrival. Patient able to tolerate light PROM of L shoulder initially with less muscle guarding as session progressed. Intermittant oscillations provided during PROM session to relieve pain. Firm end feels and smooth arc of motion noted throughout PROM session. Normal vasopneumatic response noted following removal of the modality.    Personal Factors and Comorbidities Comorbidity 3+;Time since onset of injury/illness/exacerbation;Age    Comorbidities Left reverse total shoulder replacement 11/08/2020, HTN, DM, history of  bilateral TKA, history of spinal surgery    Examination-Activity Limitations Sleep;Caring for Others;Reach Overhead;Dressing;Hygiene/Grooming;Bed Mobility    Stability/Clinical Decision Making Stable/Uncomplicated    Rehab Potential Good    PT Frequency 2x / week    PT Duration 8 weeks    PT Treatment/Interventions ADLs/Self Care Home Management;Cryotherapy;Electrical Stimulation;Moist Heat;Functional mobility training;Therapeutic activities;Therapeutic exercise;Neuromuscular re-education;Manual techniques;Passive range of motion;Patient/family education;Vasopneumatic Device;Taping    PT Next Visit Plan PROM to left shoulder for first 4-6 weeks then progress per protocol; modalities PRN for pain relief; NO IONTOPHORESIS PER AETNA    PT Home Exercise Plan continue HEP provided by MD    Consulted and Agree with Plan of Care Patient           Patient will benefit from skilled therapeutic intervention in order to improve the following deficits and impairments:  Decreased activity tolerance,Decreased strength,Increased edema,Postural dysfunction,Pain,Decreased range of motion,Impaired UE functional use  Visit Diagnosis: Stiffness of left shoulder, not elsewhere classified  Acute pain of left shoulder  Muscle weakness (generalized)     Problem List Patient Active Problem List   Diagnosis Date Noted  . Rotator cuff arthropathy of left shoulder 11/08/2020  . Abnormal feces 08/30/2020  . Chronic idiopathic constipation 08/30/2020  . Colon cancer screening 08/30/2020  . Family history of malignant neoplasm of gastrointestinal tract 08/30/2020  . Personal history of colonic polyps 08/30/2020  . Chest pain 11/28/2019  . Symptomatic bradycardia 11/28/2019  . Nontraumatic complete tear of left rotator cuff   . Hypertension 08/31/2019  . S/P left rotator cuff repair/ biceps tenodesis 02/15/19  03/01/2019  . Labral tear of long head of left biceps tendon   . Partial tear of left  subscapularis tendon   . Traumatic complete tear of left rotator cuff   . Family history of malignant neoplasm of prostate 07/28/2018  . History of Graves' disease 11/11/2017  . S/P total knee replacement, right 02/02/15 08/12/2017  . S/P total knee replacement, left 07/24/16 08/12/2017  . Hypercholesterolemia 11/08/2015  . History of DVT (deep vein thrombosis) 11/04/2015  . History of cocaine abuse ([place]) 11/04/2015  . History of heroin abuse ([place]) 11/04/2015  . Obesity 11/04/2015  . Parasomnia 11/04/2015  . Benign paroxysmal positional vertigo 10/29/2015  . Controlled type 2 diabetes mellitus without complication ([place]) 32/67/1245  . Elevated prostate specific antigen (PSA) 10/29/2015  . HX: long term anticoagulant use 10/29/2015  . Sensorineural hearing loss 10/29/2015  . Sleep apnea with use of continuous positive airway pressure (CPAP) 10/29/2015  . Chronic radicular lumbar pain 10/29/2015  . Arthrofibrosis of total knee arthroplasty ([place])   . Pulmonary embolus ([place]) 01/09/2015  . History of pulmonary embolism 01/09/2015  . UTI (lower urinary tract infection) 02/21/2014  . Other headache syndrome 02/21/2014  . Nausea with vomiting 02/21/2014  . Rheumatoid arthritis ([place]) 02/21/2014  . Nausea & vomiting 02/21/2014  . Headache(784.0) 02/21/2014  . Lumbar spinal stenosis 02/10/2014    Standley Brooking, PTA 12/03/2020, 11:46 AM  [facility] [place], [place], [address] Phone: [phone]   Fax:  [phone]  Name: Willie Howe MRN: [id number] Date of Birth: 12-08-1960

## 2020-12-06 ENCOUNTER — Other Ambulatory Visit: Payer: Self-pay

## 2020-12-06 MED ORDER — OXYCODONE HCL 5 MG PO TABS
5.0000 mg | ORAL_TABLET | Freq: Four times a day (QID) | ORAL | 0 refills | Status: DC | PRN
Start: 2020-12-06 — End: 2020-12-12

## 2020-12-10 ENCOUNTER — Ambulatory Visit: Payer: Medicare HMO | Admitting: Physical Therapy

## 2020-12-10 ENCOUNTER — Other Ambulatory Visit: Payer: Self-pay

## 2020-12-10 DIAGNOSIS — M25612 Stiffness of left shoulder, not elsewhere classified: Secondary | ICD-10-CM | POA: Diagnosis not present

## 2020-12-10 DIAGNOSIS — M25512 Pain in left shoulder: Secondary | ICD-10-CM

## 2020-12-10 DIAGNOSIS — M6281 Muscle weakness (generalized): Secondary | ICD-10-CM | POA: Diagnosis not present

## 2020-12-10 NOTE — Therapy (Signed)
[facility] [place], [place], [address] Phone: [phone]   Fax:  [phone]  Physical Therapy Treatment  Patient Details  Name: Willie Howe MRN: [id number] Date of Birth: Aug 05, 1961 Referring Provider (PT): Larena Glassman, MD   Encounter Date: 12/10/2020   PT End of Session - 12/10/20 0902    Visit Number 4    Number of Visits 16    Date for PT Re-Evaluation 12/31/20    Authorization Type Aetna Medicare (CQ and KX modifier); FOTO initial 96% limitation; progress note every 10th visit    PT Start Time 0815    PT Stop Time 0910    PT Time Calculation (min) 55 min    Activity Tolerance Patient tolerated treatment well    Behavior During Therapy [facility] for tasks assessed/performed           Past Medical History:  Diagnosis Date  . BPPV (benign paroxysmal positional vertigo)   . DVT (deep venous thrombosis) ([place])   . Graves disease 1996  . History of kidney stones   . History of pulmonary embolism   . Hypertension   . Obesity   . Rheumatoid arthritis ([place])   . Sleep apnea    Intolerant of CPAP  . Type 2 diabetes mellitus ([place])     Past Surgical History:  Procedure Laterality Date  . BACK SURGERY  2015  . COLONOSCOPY    . EXAM UNDER ANESTHESIA WITH MANIPULATION OF KNEE Right 04/18/2015   Procedure: MANIPULATION OF RIGHT KNEE UNDER ANESTHESIA;  Surgeon: Carole Civil, MD;  [facility]: [facility];  Service: Orthopedics;  Laterality: Right;  . IVC Filter     removed December 2016  . [place]  2010  . KNEE ARTHROSCOPY Right 2006   meniscus   . KNEE ARTHROSCOPY WITH MEDIAL MENISECTOMY Left 04/24/2016   Procedure: LEFT KNEE ARTHROSCOPY WITH PARTIAL MEDIAL MENISECTOMY;  Surgeon: Carole Civil, MD;  [facility]: [facility];  Service: Orthopedics;  Laterality: Left;  . KNEE SURGERY     x 2  . KNEE SURGERY Left 1976   ?ligament repair   . LEFT HEART CATH AND CORONARY ANGIOGRAPHY N/A 11/29/2019   Procedure:  LEFT HEART CATH AND CORONARY ANGIOGRAPHY;  Surgeon: Martinique, Peter M, MD;  Location: [place] [facility];  Service: Cardiovascular;  Laterality: N/A;  . LUMBAR WOUND DEBRIDEMENT N/A 02/23/2014   Procedure: LUMBAR WOUND DEBRIDEMENT;  Surgeon: Faythe Ghee, MD;  Location: [place];  Service: Neurosurgery;  Laterality: N/A;  exploration lumbar wound. repair of dural defect.  Marland Kitchen MENISECTOMY Right    open medial   . NEPHROLITHOTOMY    . PERIPHERAL VASCULAR CATHETERIZATION N/A 01/30/2015   Procedure: IVC Filter Insertion;  Surgeon: Serafina Mitchell, MD;  Location: [place] [facility];  Service: Cardiovascular;  Laterality: N/A;  . PERIPHERAL VASCULAR CATHETERIZATION N/A 04/24/2015   Procedure: IVC Filter Removal;  Surgeon: Serafina Mitchell, MD;  Location: [place] [facility];  Service: Cardiovascular;  Laterality: N/A;  . PERIPHERAL VASCULAR CATHETERIZATION N/A 08/14/2015   Procedure: IVC Filter Removal;  Surgeon: Serafina Mitchell, MD;  Location: [place] [facility];  Service: Cardiovascular;  Laterality: N/A;  . SHOULDER ARTHROSCOPY WITH OPEN ROTATOR CUFF REPAIR Left 11/10/2019   Procedure: SHOULDER ARTHROSCOPY WITH OPEN ROTATOR CUFF REPAIR;  Surgeon: Carole Civil, MD;  [facility];  Service: Orthopedics;  Laterality: Left;  pt tested + on 2/23, does not need Covid test < 90 days  .  SHOULDER ARTHROSCOPY WITH ROTATOR CUFF REPAIR AND OPEN BICEPS TENODESIS Left 02/15/2019   Procedure: SHOULDER ARTHROSCOPY WITH open ROTATOR CUFF REPAIR AND OPEN BICEPS TENODESIS;  Surgeon: Carole Civil, MD;  [facility];  Service: Orthopedics;  Laterality: Left;  . SPINE SURGERY  2015   Dr Hal Neer Fusion   . SPINE SURGERY  1999   discectomy  . TOTAL KNEE ARTHROPLASTY Right 02/02/2015   Procedure:  RIGHT TOTAL KNEE ARTHROPLASTY;  Surgeon: Carole Civil, MD;  [facility];  Service: Orthopedics;  Laterality: Right;  LM with pt's daughter of new arrival time (10:45)    . TOTAL KNEE ARTHROPLASTY Left  07/24/2016   Procedure: TOTAL KNEE ARTHROPLASTY;  Surgeon: Carole Civil, MD;  [facility];  Service: Orthopedics;  Laterality: Left;  . TOTAL SHOULDER ARTHROPLASTY Left 11/08/2020   Procedure: TOTAL SHOULDER ARTHROPLASTY;  Surgeon: Mordecai Rasmussen, MD;  [facility];  Service: Orthopedics;  Laterality: Left;  Left shoulder Reverse Arthroplasty    There were no vitals filed for this visit.   Subjective Assessment - 12/10/20 0902    Subjective COVID-19 screen performed prior to patient entering clinic.  Out of sling today.    Pertinent History Left reverse total shoulder replacement 11/08/2020, HTN, DM, history of bilateral TKA, history of spinal surgery    Limitations Lifting;House hold activities    Patient Stated Goals move left arm better    Currently in Pain? Yes    Pain Score 5     Pain Orientation Left    Pain Descriptors / Indicators Discomfort    Pain Onset More than a month ago                             [facility] Adult PT Treatment/Exercise - 12/10/20 0001      Vasopneumatic   Number Minutes Vasopneumatic  15 minutes    Vasopnuematic Location  --   Left shoulder with pillow between thorax and left elbow.   Vasopneumatic Pressure Low      Manual Therapy   Manual Therapy Passive ROM    Passive ROM In supine:  PROM and gentle STW/M x 30 minutes to patient's left shouder with emphasis on flexion and ER with low load long duration stretching utilized.                    PT Short Term Goals - 11/19/20 0928      PT SHORT TERM GOAL #1   Title STG's=LTG's             PT Long Term Goals - 12/03/20 1139      PT LONG TERM GOAL #1   Title Patient will be independent with HEP and its progression.    Time 10    Period Weeks    Status On-going      PT LONG TERM GOAL #2   Title Patient will demonstrate 140+ degrees of left shoulder flexion AROM to improve overhead activities.    Time 10    Period Weeks    Status On-going      PT  LONG TERM GOAL #3   Title Patient will demonstrate 55+ degrees of left shoulder ER AROM to improve donning/doffing apparel.    Baseline --    Time 10    Period Weeks    Status On-going      PT LONG TERM GOAL #4   Title Patient will report  ability to perform ADLs with left shoulder pain less than or equal to 4/10.    Time 10    Period Weeks    Status On-going      PT LONG TERM GOAL #5   Title Patient will demonstrate 4+/5  left shoulder MMT to improve stability during functional tasks.    Time 10    Period Weeks    Status On-going                 Plan - 12/10/20 0907    Clinical Impression Statement Patient out of his sling and doing well today.  He tolerated low load long duration stretching without complaint.    Personal Factors and Comorbidities Comorbidity 3+;Time since onset of injury/illness/exacerbation;Age    Comorbidities Left reverse total shoulder replacement 11/08/2020, HTN, DM, history of bilateral TKA, history of spinal surgery    Examination-Activity Limitations Sleep;Caring for Others;Reach Overhead;Dressing;Hygiene/Grooming;Bed Mobility    Stability/Clinical Decision Making Stable/Uncomplicated    Rehab Potential Good    PT Frequency 2x / week    PT Duration 8 weeks    PT Treatment/Interventions ADLs/Self Care Home Management;Cryotherapy;Electrical Stimulation;Moist Heat;Functional mobility training;Therapeutic activities;Therapeutic exercise;Neuromuscular re-education;Manual techniques;Passive range of motion;Patient/family education;Vasopneumatic Device;Taping    PT Next Visit Plan PROM to left shoulder for first 4-6 weeks then progress per protocol; modalities PRN for pain relief; NO IONTOPHORESIS PER AETNA           Patient will benefit from skilled therapeutic intervention in order to improve the following deficits and impairments:     Visit Diagnosis: Stiffness of left shoulder, not elsewhere classified  Acute pain of left  shoulder     Problem List Patient Active Problem List   Diagnosis Date Noted  . Rotator cuff arthropathy of left shoulder 11/08/2020  . Abnormal feces 08/30/2020  . Chronic idiopathic constipation 08/30/2020  . Colon cancer screening 08/30/2020  . Family history of malignant neoplasm of gastrointestinal tract 08/30/2020  . Personal history of colonic polyps 08/30/2020  . Chest pain 11/28/2019  . Symptomatic bradycardia 11/28/2019  . Nontraumatic complete tear of left rotator cuff   . Hypertension 08/31/2019  . S/P left rotator cuff repair/ biceps tenodesis 02/15/19  03/01/2019  . Labral tear of long head of left biceps tendon   . Partial tear of left subscapularis tendon   . Traumatic complete tear of left rotator cuff   . Family history of malignant neoplasm of prostate 07/28/2018  . History of Graves' disease 11/11/2017  . S/P total knee replacement, right 02/02/15 08/12/2017  . S/P total knee replacement, left 07/24/16 08/12/2017  . Hypercholesterolemia 11/08/2015  . History of DVT (deep vein thrombosis) 11/04/2015  . History of cocaine abuse ([place]) 11/04/2015  . History of heroin abuse ([place]) 11/04/2015  . Obesity 11/04/2015  . Parasomnia 11/04/2015  . Benign paroxysmal positional vertigo 10/29/2015  . Controlled type 2 diabetes mellitus without complication ([place]) 28/00/3491  . Elevated prostate specific antigen (PSA) 10/29/2015  . HX: long term anticoagulant use 10/29/2015  . Sensorineural hearing loss 10/29/2015  . Sleep apnea with use of continuous positive airway pressure (CPAP) 10/29/2015  . Chronic radicular lumbar pain 10/29/2015  . Arthrofibrosis of total knee arthroplasty ([place])   . Pulmonary embolus ([place]) 01/09/2015  . History of pulmonary embolism 01/09/2015  . UTI (lower urinary tract infection) 02/21/2014  . Other headache syndrome 02/21/2014  . Nausea with vomiting 02/21/2014  . Rheumatoid arthritis ([place]) 02/21/2014  . Nausea & vomiting 02/21/2014  .  Headache(784.0)  02/21/2014  . Lumbar spinal stenosis 02/10/2014    Karelyn Brisby, [place] MPT  12/10/2020, 9:14 AM  [facility] [address] [place], [place], [address] Phone: [phone]   Fax:  [phone]  Name: EZREAL MARSHBURN MRN: [id number] Date of Birth: 12/29/1960

## 2020-12-12 ENCOUNTER — Other Ambulatory Visit: Payer: Self-pay

## 2020-12-13 DIAGNOSIS — Z79899 Other long term (current) drug therapy: Secondary | ICD-10-CM | POA: Diagnosis not present

## 2020-12-13 DIAGNOSIS — M150 Primary generalized (osteo)arthritis: Secondary | ICD-10-CM | POA: Diagnosis not present

## 2020-12-13 DIAGNOSIS — E669 Obesity, unspecified: Secondary | ICD-10-CM | POA: Diagnosis not present

## 2020-12-13 DIAGNOSIS — Z6836 Body mass index (BMI) 36.0-36.9, adult: Secondary | ICD-10-CM | POA: Diagnosis not present

## 2020-12-13 DIAGNOSIS — M0589 Other rheumatoid arthritis with rheumatoid factor of multiple sites: Secondary | ICD-10-CM | POA: Diagnosis not present

## 2020-12-13 MED ORDER — OXYCODONE HCL 5 MG PO TABS: 5.0000 mg | ORAL_TABLET | Freq: Four times a day (QID) | ORAL | 0 refills | Status: AC | PRN

## 2020-12-17 ENCOUNTER — Other Ambulatory Visit: Payer: Self-pay

## 2020-12-17 ENCOUNTER — Ambulatory Visit: Payer: Medicare HMO | Attending: Orthopedic Surgery | Admitting: Physical Therapy

## 2020-12-17 DIAGNOSIS — G8929 Other chronic pain: Secondary | ICD-10-CM | POA: Insufficient documentation

## 2020-12-17 DIAGNOSIS — M25512 Pain in left shoulder: Secondary | ICD-10-CM | POA: Diagnosis not present

## 2020-12-17 DIAGNOSIS — M25612 Stiffness of left shoulder, not elsewhere classified: Secondary | ICD-10-CM | POA: Diagnosis not present

## 2020-12-17 DIAGNOSIS — M6281 Muscle weakness (generalized): Secondary | ICD-10-CM | POA: Insufficient documentation

## 2020-12-17 NOTE — Patient Instructions (Signed)
   Isometric Shoulder Abduction   Keep the affected arm bent at 90 degrees and close to your side. Place the ball on the outside of your elbow, against a wall and make a fist. Press out into the ball (hinging at the shoulder). Try to avoid shrugging the shoulder    Isometric shoulder forward flexion     Sit or stand with right elbow at your side. Bend the elbow 90 degrees so forearm is parrallel to floor. Block your hand with the left hand. Now push forward into the left hand , staying with a pain free level of effort. There should be no forward movement of the right arm. Hold for prescribed time. THen relax for 5 seconds and repeat.    SHOULDER - ISOMETRIC EXTERNAL ROTATION   Gently press your hand into a wall using the back side of your hand.  Maintain a bent elbow the entire time.      10sec x10reps each

## 2020-12-17 NOTE — Therapy (Signed)
[facility] [place], [place], [address] Phone: [phone]   Fax:  [phone]  Physical Therapy Treatment  Patient Details  Name: Willie Howe MRN: [id number] Date of Birth: [birth year] Referring Provider (PT): Larena Glassman, MD   Encounter Date: 12/17/2020   PT End of Session - 12/17/20 0850    Visit Number 5    Number of Visits 16    Date for PT Re-Evaluation 12/31/20    Authorization Type Aetna Medicare (CQ and KX modifier); FOTO initial 96% limitation; progress note every 10th visit    PT Start Time 0815    PT Stop Time 0856    PT Time Calculation (min) 41 min    Activity Tolerance Patient tolerated treatment well    Behavior During Therapy [facility] for tasks assessed/performed           Past Medical History:  Diagnosis Date  . BPPV (benign paroxysmal positional vertigo)   . DVT (deep venous thrombosis) ([place])   . Graves disease 1996  . History of kidney stones   . History of pulmonary embolism   . Hypertension   . Obesity   . Rheumatoid arthritis ([place])   . Sleep apnea    Intolerant of CPAP  . Type 2 diabetes mellitus ([place])     Past Surgical History:  Procedure Laterality Date  . BACK SURGERY  2015  . COLONOSCOPY    . EXAM UNDER ANESTHESIA WITH MANIPULATION OF KNEE Right 04/18/2015   Procedure: MANIPULATION OF RIGHT KNEE UNDER ANESTHESIA;  Surgeon: Carole Civil, MD;  [facility]: [facility];  Service: Orthopedics;  Laterality: Right;  . IVC Filter     removed December 2016  . [place]  2010  . KNEE ARTHROSCOPY Right 2006   meniscus   . KNEE ARTHROSCOPY WITH MEDIAL MENISECTOMY Left 04/24/2016   Procedure: LEFT KNEE ARTHROSCOPY WITH PARTIAL MEDIAL MENISECTOMY;  Surgeon: Carole Civil, MD;  [facility];  Service: Orthopedics;  Laterality: Left;  . KNEE SURGERY     x 2  . KNEE SURGERY Left 1976   ?ligament repair   . LEFT HEART CATH AND CORONARY ANGIOGRAPHY N/A 11/29/2019   Procedure: LEFT  HEART CATH AND CORONARY ANGIOGRAPHY;  Surgeon: Martinique, Peter M, MD;  Location: [place] [facility];  Service: Cardiovascular;  Laterality: N/A;  . LUMBAR WOUND DEBRIDEMENT N/A 02/23/2014   Procedure: LUMBAR WOUND DEBRIDEMENT;  Surgeon: Faythe Ghee, MD;  Location: [place];  Service: Neurosurgery;  Laterality: N/A;  exploration lumbar wound. repair of dural defect.  Marland Kitchen MENISECTOMY Right    open medial   . NEPHROLITHOTOMY    . PERIPHERAL VASCULAR CATHETERIZATION N/A 01/30/2015   Procedure: IVC Filter Insertion;  Surgeon: Serafina Mitchell, MD;  Location: [place] [facility];  Service: Cardiovascular;  Laterality: N/A;  . PERIPHERAL VASCULAR CATHETERIZATION N/A 04/24/2015   Procedure: IVC Filter Removal;  Surgeon: Serafina Mitchell, MD;  Location: [place] [facility];  Service: Cardiovascular;  Laterality: N/A;  . PERIPHERAL VASCULAR CATHETERIZATION N/A 08/14/2015   Procedure: IVC Filter Removal;  Surgeon: Serafina Mitchell, MD;  Location: [place] [facility];  Service: Cardiovascular;  Laterality: N/A;  . SHOULDER ARTHROSCOPY WITH OPEN ROTATOR CUFF REPAIR Left 11/10/2019   Procedure: SHOULDER ARTHROSCOPY WITH OPEN ROTATOR CUFF REPAIR;  Surgeon: Carole Civil, MD;  [facility];  Service: Orthopedics;  Laterality: Left;  pt tested + on 2/23, does not need Covid test < 90 days  .  SHOULDER ARTHROSCOPY WITH ROTATOR CUFF REPAIR AND OPEN BICEPS TENODESIS Left 02/15/2019   Procedure: SHOULDER ARTHROSCOPY WITH open ROTATOR CUFF REPAIR AND OPEN BICEPS TENODESIS;  Surgeon: Carole Civil, MD;  [facility];  Service: Orthopedics;  Laterality: Left;  . SPINE SURGERY  2015   Dr Hal Neer Fusion   . SPINE SURGERY  1999   discectomy  . TOTAL KNEE ARTHROPLASTY Right 02/02/2015   Procedure:  RIGHT TOTAL KNEE ARTHROPLASTY;  Surgeon: Carole Civil, MD;  [facility];  Service: Orthopedics;  Laterality: Right;  LM with pt's daughter of new arrival time (10:45)    . TOTAL KNEE ARTHROPLASTY Left 07/24/2016    Procedure: TOTAL KNEE ARTHROPLASTY;  Surgeon: Carole Civil, MD;  [facility];  Service: Orthopedics;  Laterality: Left;  . TOTAL SHOULDER ARTHROPLASTY Left 11/08/2020   Procedure: TOTAL SHOULDER ARTHROPLASTY;  Surgeon: Mordecai Rasmussen, MD;  [facility];  Service: Orthopedics;  Laterality: Left;  Left shoulder Reverse Arthroplasty    There were no vitals filed for this visit.   Subjective Assessment - 12/17/20 0814    Subjective COVID-19 screen performed prior to patient entering clinic.  Patient arrived with increased soreness    Pertinent History Left reverse total shoulder replacement 11/08/2020, HTN, DM, history of bilateral TKA, history of spinal surgery    Limitations Lifting;House hold activities    Patient Stated Goals move left arm better    Currently in Pain? Yes    Pain Score 5     Pain Location Shoulder    Pain Orientation Left    Pain Descriptors / Indicators Discomfort    Pain Type Surgical pain    Pain Onset More than a month ago    Pain Frequency Constant    Aggravating Factors  movement    Pain Relieving Factors rest/meds              [facility] PT Assessment - 12/17/20 0001      PROM   PROM Assessment Site Shoulder    Right/Left Shoulder Left    Left Shoulder Flexion 85 Degrees    Left Shoulder External Rotation 28 Degrees                         [facility] Adult PT Treatment/Exercise - 12/17/20 0001      Vasopneumatic   Number Minutes Vasopneumatic  15 minutes    Vasopnuematic Location  Shoulder    Vasopneumatic Pressure Low    Vasopneumatic Temperature  34 for pain and edema      Manual Therapy   Manual Therapy Passive ROM    Passive ROM manual PROM to left shoulder all motions within protocol limitations to improve mobility                  PT Education - 12/17/20 0845    Education Details Isometrics ER/Flex/abd    Person(s) Educated Patient    Methods Explanation;Demonstration;Handout    Comprehension Verbalized  understanding;Returned demonstration            PT Short Term Goals - 11/19/20 0928      PT SHORT TERM GOAL #1   Title STG's=LTG's             PT Long Term Goals - 12/17/20 0818      PT LONG TERM GOAL #1   Title Patient will be independent with HEP and its progression.    Time 10    Period Weeks  Status On-going      PT LONG TERM GOAL #2   Title Patient will demonstrate 140+ degrees of left shoulder flexion AROM to improve overhead activities.    Time 10    Period Weeks    Status On-going      PT LONG TERM GOAL #3   Title Patient will demonstrate 55+ degrees of left shoulder ER AROM to improve donning/doffing apparel.    Time 10    Period Weeks    Status On-going      PT LONG TERM GOAL #4   Title Patient will report ability to perform ADLs with left shoulder pain less than or equal to 4/10.    Time 10    Period Weeks    Status On-going      PT LONG TERM GOAL #5   Title Patient will demonstrate 4+/5  left shoulder MMT to improve stability during functional tasks.    Time 10    Period Weeks    Status On-going                 Plan - 12/17/20 0845    Clinical Impression Statement Patient tolerated treatment well today yet some increased pain. Patient continues to improve with ROM for flexion and ER today. HEP provide for isometrics for flex/ER/Abd. Patient continues to have pain with movement and less with rest and meds. Goals progressing this week.    Personal Factors and Comorbidities Comorbidity 3+;Time since onset of injury/illness/exacerbation;Age    Comorbidities Left reverse total shoulder replacement 11/08/2020, HTN, DM, history of bilateral TKA, history of spinal surgery    Examination-Activity Limitations Sleep;Caring for Others;Reach Overhead;Dressing;Hygiene/Grooming;Bed Mobility    Stability/Clinical Decision Making Stable/Uncomplicated    Rehab Potential Good    PT Frequency 2x / week    PT Duration 8 weeks    PT Treatment/Interventions  ADLs/Self Care Home Management;Cryotherapy;Electrical Stimulation;Moist Heat;Functional mobility training;Therapeutic activities;Therapeutic exercise;Neuromuscular re-education;Manual techniques;Passive range of motion;Patient/family education;Vasopneumatic Device;Taping    PT Next Visit Plan PROM to left shoulder for first 4-6 weeks then progress per protocol; modalities PRN for pain relief; NO IONTOPHORESIS PER AETNA    Consulted and Agree with Plan of Care Patient           Patient will benefit from skilled therapeutic intervention in order to improve the following deficits and impairments:  Decreased activity tolerance,Decreased strength,Increased edema,Postural dysfunction,Pain,Decreased range of motion,Impaired UE functional use  Visit Diagnosis: Stiffness of left shoulder, not elsewhere classified  Acute pain of left shoulder  Muscle weakness (generalized)     Problem List Patient Active Problem List   Diagnosis Date Noted  . Rotator cuff arthropathy of left shoulder 11/08/2020  . Abnormal feces 08/30/2020  . Chronic idiopathic constipation 08/30/2020  . Colon cancer screening 08/30/2020  . Family history of malignant neoplasm of gastrointestinal tract 08/30/2020  . Personal history of colonic polyps 08/30/2020  . Chest pain 11/28/2019  . Symptomatic bradycardia 11/28/2019  . Nontraumatic complete tear of left rotator cuff   . Hypertension 08/31/2019  . S/P left rotator cuff repair/ biceps tenodesis 02/15/19  03/01/2019  . Labral tear of long head of left biceps tendon   . Partial tear of left subscapularis tendon   . Traumatic complete tear of left rotator cuff   . Family history of malignant neoplasm of prostate 07/28/2018  . History of Graves' disease 11/11/2017  . S/P total knee replacement, right 02/02/15 08/12/2017  . S/P total knee replacement, left 07/24/16 08/12/2017  . Hypercholesterolemia 11/08/2015  .  History of DVT (deep vein thrombosis) 11/04/2015  .  History of cocaine abuse ([place]) 11/04/2015  . History of heroin abuse ([place]) 11/04/2015  . Obesity 11/04/2015  . Parasomnia 11/04/2015  . Benign paroxysmal positional vertigo 10/29/2015  . Controlled type 2 diabetes mellitus without complication ([place]) 45/62/5638  . Elevated prostate specific antigen (PSA) 10/29/2015  . HX: long term anticoagulant use 10/29/2015  . Sensorineural hearing loss 10/29/2015  . Sleep apnea with use of continuous positive airway pressure (CPAP) 10/29/2015  . Chronic radicular lumbar pain 10/29/2015  . Arthrofibrosis of total knee arthroplasty ([place])   . Pulmonary embolus ([place]) 01/09/2015  . History of pulmonary embolism 01/09/2015  . UTI (lower urinary tract infection) 02/21/2014  . Other headache syndrome 02/21/2014  . Nausea with vomiting 02/21/2014  . Rheumatoid arthritis ([place]) 02/21/2014  . Nausea & vomiting 02/21/2014  . Headache(784.0) 02/21/2014  . Lumbar spinal stenosis 02/10/2014    Ladean Raya, PTA 12/17/20 8:56 AM  [facility] [place], [place], [address] Phone: [phone]   Fax:  [phone]  Name: Willie Howe MRN: [id number] Date of Birth: 1960-12-08

## 2020-12-19 ENCOUNTER — Ambulatory Visit (INDEPENDENT_AMBULATORY_CARE_PROVIDER_SITE_OTHER): Payer: Medicare HMO | Admitting: Orthopedic Surgery

## 2020-12-19 ENCOUNTER — Other Ambulatory Visit: Payer: Self-pay

## 2020-12-19 ENCOUNTER — Ambulatory Visit: Payer: Medicare HMO

## 2020-12-19 ENCOUNTER — Encounter: Payer: Self-pay | Admitting: Orthopedic Surgery

## 2020-12-19 DIAGNOSIS — M12812 Other specific arthropathies, not elsewhere classified, left shoulder: Secondary | ICD-10-CM

## 2020-12-19 DIAGNOSIS — Z96612 Presence of left artificial shoulder joint: Secondary | ICD-10-CM

## 2020-12-19 DIAGNOSIS — Z9889 Other specified postprocedural states: Secondary | ICD-10-CM

## 2020-12-19 NOTE — Progress Notes (Signed)
Orthopaedic Postop Note  Assessment: Willie Howe is a 60 y.o. male s/p Left Reverse Shoulder Arthroplasty  DOS: 11/08/2020  Plan: [name] is improving, albeit gradually.  I am a little concerned that he is becoming stiff at this point in his recovery.  This was stressed to the patient.  It is okay for him to push his left shoulder a little bit more, in an attempt to improve his range of motion.  I do think this will help with his pain overall.  I am also concerned about his continued use of narcotics.  This was discussed in clinic.  We will plan to transition him from oxycodone to [place], with the goal of getting him off narcotics within the next few weeks.  He stated his understanding.  He does feel as though he is improving, and is hopeful that things will continue to get better.  Follow-up in 6 weeks.  Follow-up: Return in about 6 weeks (around 01/30/2021).  XR at next visit: Left shoulder  Subjective:  Chief Complaint  Patient presents with  . Shoulder Pain    Post op left 11/08/20    History of Present Illness: Willie Howe is a 60 y.o. male who presents following the above stated procedure.  He is now approximately 6 weeks out from surgery.  His pain continues to improve but he is still taking oxycodone on a regular basis.  He has been working with physical therapy, and notes some improvements.  Although, he continues to have stiffness in his shoulder.  No numbness or tingling.  He continues to sleep in a recliner.  He is no longer using his sling.   Review of Systems: No fevers or chills No numbness or tingling No Chest Pain No shortness of breath   Objective: There were no vitals taken for this visit.  Physical Exam:  Alert and oriented, no acute distress.  Left anterior shoulder incision is healing well without surrounding erythema or drainage.  He has intact sensation over the axillary patch.  The deltoid fires.  No numbness or tingling in his forearm.   Sensation is intact distally in the M/U/R nerve distribution.  Active motion intact in the AIN/PIN/U nerve distribution.  Fingers are warm and well perfused.  Actively, he can abduct his shoulder approximately 45 degrees.  Passively I get him to 70 degrees, with obvious discomfort.  Forward flexion to approximately 60 degrees, passively I get him to 80 degrees.  Limited external rotation at his side.  IMAGING: I personally ordered and reviewed the following images:  XR of the Left obtained in clinic today and demonstrates shoulder arthroplasty with implants in good position.  No evidence of acute injury or subsidence of implants.   Impression: Left shoulder reverse arthroplasty in good position   Mordecai Rasmussen, MD 12/20/2020 9:34 AM

## 2020-12-20 ENCOUNTER — Telehealth: Payer: Self-pay | Admitting: Orthopedic Surgery

## 2020-12-20 MED ORDER — HYDROCODONE-ACETAMINOPHEN 5-325 MG PO TABS: 1.0000 | ORAL_TABLET | Freq: Four times a day (QID) | ORAL | 0 refills | Status: DC | PRN

## 2020-12-20 NOTE — Telephone Encounter (Signed)
Please assist

## 2020-12-20 NOTE — Telephone Encounter (Signed)
Patient called and stated that he saw Dr. Amedeo Kinsman yesterday and they discussed pain medication.  He said that he checked with the pharmacy but no medication has been sent in for him at all.  Can you check on this?  Thanks

## 2020-12-24 ENCOUNTER — Ambulatory Visit: Payer: Medicare HMO | Admitting: Physical Therapy

## 2020-12-24 ENCOUNTER — Other Ambulatory Visit: Payer: Self-pay

## 2020-12-24 DIAGNOSIS — M25512 Pain in left shoulder: Secondary | ICD-10-CM

## 2020-12-24 DIAGNOSIS — M6281 Muscle weakness (generalized): Secondary | ICD-10-CM

## 2020-12-24 DIAGNOSIS — M25612 Stiffness of left shoulder, not elsewhere classified: Secondary | ICD-10-CM

## 2020-12-24 DIAGNOSIS — G8929 Other chronic pain: Secondary | ICD-10-CM | POA: Diagnosis not present

## 2020-12-24 NOTE — Therapy (Signed)
[facility] [place], [place], [address] Phone: [phone]   Fax:  [phone]  Physical Therapy Treatment  Patient Details  Name: Willie Howe MRN: [id number] Date of Birth: 1961-03-30 Referring Provider (PT): Larena Glassman, MD   Encounter Date: 12/24/2020   PT End of Session - 12/24/20 0930    Visit Number 6    Number of Visits 16    Date for PT Re-Evaluation 12/31/20    Authorization Type Aetna Medicare (CQ and KX modifier); FOTO initial 96% limitation; progress note every 10th visit    PT Start Time 0815    PT Stop Time 0913    PT Time Calculation (min) 58 min    Activity Tolerance Patient tolerated treatment well    Behavior During Therapy [facility] for tasks assessed/performed           Past Medical History:  Diagnosis Date  . BPPV (benign paroxysmal positional vertigo)   . DVT (deep venous thrombosis) ([place])   . Graves disease 1996  . History of kidney stones   . History of pulmonary embolism   . Hypertension   . Obesity   . Rheumatoid arthritis ([place])   . Sleep apnea    Intolerant of CPAP  . Type 2 diabetes mellitus ([place])     Past Surgical History:  Procedure Laterality Date  . BACK SURGERY  2015  . COLONOSCOPY    . EXAM UNDER ANESTHESIA WITH MANIPULATION OF KNEE Right 04/18/2015   Procedure: MANIPULATION OF RIGHT KNEE UNDER ANESTHESIA;  Surgeon: Carole Civil, MD;  Location: [facility];  Service: Orthopedics;  Laterality: Right;  . IVC Filter     removed December 2016  . [place]  2010  . KNEE ARTHROSCOPY Right 2006   meniscus   . KNEE ARTHROSCOPY WITH MEDIAL MENISECTOMY Left 04/24/2016   Procedure: LEFT KNEE ARTHROSCOPY WITH PARTIAL MEDIAL MENISECTOMY;  Surgeon: Carole Civil, MD;  Location: [facility];  Service: Orthopedics;  Laterality: Left;  . KNEE SURGERY     x 2  . KNEE SURGERY Left 1976   ?ligament repair   . LEFT HEART CATH AND CORONARY ANGIOGRAPHY N/A 11/29/2019   Procedure: LEFT  HEART CATH AND CORONARY ANGIOGRAPHY;  Surgeon: Martinique, Peter M, MD;  Location: [place] [facility];  Service: Cardiovascular;  Laterality: N/A;  . LUMBAR WOUND DEBRIDEMENT N/A 02/23/2014   Procedure: LUMBAR WOUND DEBRIDEMENT;  Surgeon: Faythe Ghee, MD;  Location: [place];  Service: Neurosurgery;  Laterality: N/A;  exploration lumbar wound. repair of dural defect.  Marland Kitchen MENISECTOMY Right    open medial   . NEPHROLITHOTOMY    . PERIPHERAL VASCULAR CATHETERIZATION N/A 01/30/2015   Procedure: IVC Filter Insertion;  Surgeon: Serafina Mitchell, MD;  Location: [place] [facility];  Service: Cardiovascular;  Laterality: N/A;  . PERIPHERAL VASCULAR CATHETERIZATION N/A 04/24/2015   Procedure: IVC Filter Removal;  Surgeon: Serafina Mitchell, MD;  Location: [place] [facility];  Service: Cardiovascular;  Laterality: N/A;  . PERIPHERAL VASCULAR CATHETERIZATION N/A 08/14/2015   Procedure: IVC Filter Removal;  Surgeon: Serafina Mitchell, MD;  Location: [place] [facility];  Service: Cardiovascular;  Laterality: N/A;  . SHOULDER ARTHROSCOPY WITH OPEN ROTATOR CUFF REPAIR Left 11/10/2019   Procedure: SHOULDER ARTHROSCOPY WITH OPEN ROTATOR CUFF REPAIR;  Surgeon: Carole Civil, MD;  [facility];  Service: Orthopedics;  Laterality: Left;  pt tested + on 2/23, does not need Covid test < 90 days  .  SHOULDER ARTHROSCOPY WITH ROTATOR CUFF REPAIR AND OPEN BICEPS TENODESIS Left 02/15/2019   Procedure: SHOULDER ARTHROSCOPY WITH open ROTATOR CUFF REPAIR AND OPEN BICEPS TENODESIS;  Surgeon: Vickki Hearing, MD;  [facility];  Service: Orthopedics;  Laterality: Left;  . SPINE SURGERY  2015   Dr Gerlene Fee Fusion   . SPINE SURGERY  1999   discectomy  . TOTAL KNEE ARTHROPLASTY Right 02/02/2015   Procedure:  RIGHT TOTAL KNEE ARTHROPLASTY;  Surgeon: Vickki Hearing, MD;  [facility];  Service: Orthopedics;  Laterality: Right;  LM with pt's daughter of new arrival time (10:45)    . TOTAL KNEE ARTHROPLASTY Left 07/24/2016    Procedure: TOTAL KNEE ARTHROPLASTY;  Surgeon: Vickki Hearing, MD;  [facility];  Service: Orthopedics;  Laterality: Left;  . TOTAL SHOULDER ARTHROPLASTY Left 11/08/2020   Procedure: TOTAL SHOULDER ARTHROPLASTY;  Surgeon: Oliver Barre, MD;  [facility];  Service: Orthopedics;  Laterality: Left;  Left shoulder Reverse Arthroplasty    There were no vitals filed for this visit.   Subjective Assessment - 12/24/20 0926    Subjective COVID-19 screen performed prior to patient entering clinic.  Pain lower today.    Pertinent History Left reverse total shoulder replacement 11/08/2020, HTN, DM, history of bilateral TKA, history of spinal surgery    Limitations Lifting;House hold activities    Patient Stated Goals move left arm better    Currently in Pain? Yes    Pain Score 3     Pain Orientation Left    Pain Descriptors / Indicators Discomfort                             [facility] Adult PT Treatment/Exercise - 12/24/20 0001      Vasopneumatic   Number Minutes Vasopneumatic  15 minutes    Vasopnuematic Location  --   Left shoulder.   Vasopneumatic Pressure Low      Manual Therapy   Manual Therapy Passive ROM    Passive ROM In supine:  PROM x 38 minutes to patient's left shoulder into flexion, ER, IR and abduction with low load long duration strecthing utilized.                    PT Short Term Goals - 11/19/20 0928      PT SHORT TERM GOAL #1   Title STG's=LTG's             PT Long Term Goals - 12/17/20 0818      PT LONG TERM GOAL #1   Title Patient will be independent with HEP and its progression.    Time 10    Period Weeks    Status On-going      PT LONG TERM GOAL #2   Title Patient will demonstrate 140+ degrees of left shoulder flexion AROM to improve overhead activities.    Time 10    Period Weeks    Status On-going      PT LONG TERM GOAL #3   Title Patient will demonstrate 55+ degrees of left shoulder ER AROM to improve  donning/doffing apparel.    Time 10    Period Weeks    Status On-going      PT LONG TERM GOAL #4   Title Patient will report ability to perform ADLs with left shoulder pain less than or equal to 4/10.    Time 10    Period Weeks  Status On-going      PT LONG TERM GOAL #5   Title Patient will demonstrate 4+/5  left shoulder MMT to improve stability during functional tasks.    Time 10    Period Weeks    Status On-going                 Plan - 12/24/20 3716    Clinical Impression Statement Patient with lowered pain-level today.  He did very well with PROM to his left shoulder.    Personal Factors and Comorbidities Comorbidity 3+;Time since onset of injury/illness/exacerbation;Age    Examination-Activity Limitations Sleep;Caring for Others;Reach Overhead;Dressing;Hygiene/Grooming;Bed Mobility    Stability/Clinical Decision Making Stable/Uncomplicated    Rehab Potential Good    PT Frequency 2x / week    PT Duration 8 weeks    PT Treatment/Interventions ADLs/Self Care Home Management;Cryotherapy;Electrical Stimulation;Moist Heat;Functional mobility training;Therapeutic activities;Therapeutic exercise;Neuromuscular re-education;Manual techniques;Passive range of motion;Patient/family education;Vasopneumatic Device;Taping    PT Home Exercise Plan continue HEP provided by MD    Consulted and Agree with Plan of Care Patient           Patient will benefit from skilled therapeutic intervention in order to improve the following deficits and impairments:  Decreased activity tolerance,Decreased strength,Increased edema,Postural dysfunction,Pain,Decreased range of motion,Impaired UE functional use  Visit Diagnosis: Stiffness of left shoulder, not elsewhere classified  Acute pain of left shoulder  Muscle weakness (generalized)     Problem List Patient Active Problem List   Diagnosis Date Noted  . Rotator cuff arthropathy of left shoulder 11/08/2020  . Abnormal feces  08/30/2020  . Chronic idiopathic constipation 08/30/2020  . Colon cancer screening 08/30/2020  . Family history of malignant neoplasm of gastrointestinal tract 08/30/2020  . Personal history of colonic polyps 08/30/2020  . Chest pain 11/28/2019  . Symptomatic bradycardia 11/28/2019  . Nontraumatic complete tear of left rotator cuff   . Hypertension 08/31/2019  . S/P left rotator cuff repair/ biceps tenodesis 02/15/19  03/01/2019  . Labral tear of long head of left biceps tendon   . Partial tear of left subscapularis tendon   . Traumatic complete tear of left rotator cuff   . Family history of malignant neoplasm of prostate 07/28/2018  . History of Graves' disease 11/11/2017  . S/P total knee replacement, right 02/02/15 08/12/2017  . S/P total knee replacement, left 07/24/16 08/12/2017  . Hypercholesterolemia 11/08/2015  . History of DVT (deep vein thrombosis) 11/04/2015  . History of cocaine abuse ([place]) 11/04/2015  . History of heroin abuse ([place]) 11/04/2015  . Obesity 11/04/2015  . Parasomnia 11/04/2015  . Benign paroxysmal positional vertigo 10/29/2015  . Controlled type 2 diabetes mellitus without complication ([place]) 96/78/9381  . Elevated prostate specific antigen (PSA) 10/29/2015  . HX: long term anticoagulant use 10/29/2015  . Sensorineural hearing loss 10/29/2015  . Sleep apnea with use of continuous positive airway pressure (CPAP) 10/29/2015  . Chronic radicular lumbar pain 10/29/2015  . Arthrofibrosis of total knee arthroplasty ([place])   . Pulmonary embolus ([place]) 01/09/2015  . History of pulmonary embolism 01/09/2015  . UTI (lower urinary tract infection) 02/21/2014  . Other headache syndrome 02/21/2014  . Nausea with vomiting 02/21/2014  . Rheumatoid arthritis ([place]) 02/21/2014  . Nausea & vomiting 02/21/2014  . Headache(784.0) 02/21/2014  . Lumbar spinal stenosis 02/10/2014    Melana Hingle, [place] MPT 12/24/2020, 9:30 AM  [facility] [address] [place], [place], [address] Phone: [phone]   Fax:  [phone]  Name: Willie Howe  Brooke Bonito MRN: [id number] Date of Birth: Dec 14, 1960

## 2020-12-26 ENCOUNTER — Other Ambulatory Visit: Payer: Self-pay

## 2020-12-27 ENCOUNTER — Other Ambulatory Visit: Payer: Self-pay | Admitting: Orthopedic Surgery

## 2020-12-27 MED ORDER — HYDROCODONE-ACETAMINOPHEN 5-325 MG PO TABS: 1.0000 | ORAL_TABLET | Freq: Four times a day (QID) | ORAL | 0 refills | Status: DC | PRN

## 2020-12-31 ENCOUNTER — Ambulatory Visit: Payer: Medicare HMO | Admitting: Physical Therapy

## 2020-12-31 ENCOUNTER — Other Ambulatory Visit: Payer: Self-pay

## 2020-12-31 DIAGNOSIS — M25612 Stiffness of left shoulder, not elsewhere classified: Secondary | ICD-10-CM | POA: Diagnosis not present

## 2020-12-31 DIAGNOSIS — M25512 Pain in left shoulder: Secondary | ICD-10-CM

## 2020-12-31 DIAGNOSIS — M6281 Muscle weakness (generalized): Secondary | ICD-10-CM | POA: Diagnosis not present

## 2020-12-31 DIAGNOSIS — G8929 Other chronic pain: Secondary | ICD-10-CM | POA: Diagnosis not present

## 2020-12-31 NOTE — Therapy (Signed)
[facility] [place], [place], [address] Phone: [phone]   Fax:  [phone]  Physical Therapy Treatment  Patient Details  Name: Willie Howe MRN: [id number] Date of Birth: 02-Sep-1960 Referring Provider (PT): Larena Glassman, MD   Encounter Date: 12/31/2020   PT End of Session - 12/31/20 1051    Visit Number 7    Number of Visits 16    Date for PT Re-Evaluation 12/31/20    Authorization Type Aetna Medicare (CQ and KX modifier); FOTO initial 96% limitation; progress note every 10th visit    PT Start Time 0815    PT Stop Time 0900    PT Time Calculation (min) 45 min    Activity Tolerance Patient tolerated treatment well    Behavior During Therapy WFL for tasks assessed/performed           Past Medical History:  Diagnosis Date  . BPPV (benign paroxysmal positional vertigo)   . DVT (deep venous thrombosis) ([place])   . Graves disease 1996  . History of kidney stones   . History of pulmonary embolism   . Hypertension   . Obesity   . Rheumatoid arthritis ([place])   . Sleep apnea    Intolerant of CPAP  . Type 2 diabetes mellitus ([place])     Past Surgical History:  Procedure Laterality Date  . BACK SURGERY  2015  . COLONOSCOPY    . EXAM UNDER ANESTHESIA WITH MANIPULATION OF KNEE Right 04/18/2015   Procedure: MANIPULATION OF RIGHT KNEE UNDER ANESTHESIA;  Surgeon: Carole Civil, MD;  Location: [facility];  Service: Orthopedics;  Laterality: Right;  . IVC Filter     removed December 2016  . [place]  2010  . KNEE ARTHROSCOPY Right 2006   meniscus   . KNEE ARTHROSCOPY WITH MEDIAL MENISECTOMY Left 04/24/2016   Procedure: LEFT KNEE ARTHROSCOPY WITH PARTIAL MEDIAL MENISECTOMY;  Surgeon: Carole Civil, MD;  Location[facility];  Service: Orthopedics;  Laterality: Left;  . KNEE SURGERY     x 2  . KNEE SURGERY Left 1976   ?ligament repair   . LEFT HEART CATH AND CORONARY ANGIOGRAPHY N/A 11/29/2019   Procedure:  LEFT HEART CATH AND CORONARY ANGIOGRAPHY;  Surgeon: Martinique, Peter M, MD;  Location: [place] [facility];  Service: Cardiovascular;  Laterality: N/A;  . LUMBAR WOUND DEBRIDEMENT N/A 02/23/2014   Procedure: LUMBAR WOUND DEBRIDEMENT;  Surgeon: Faythe Ghee, MD;  Location: [place];  Service: Neurosurgery;  Laterality: N/A;  exploration lumbar wound. repair of dural defect.  Marland Kitchen MENISECTOMY Right    open medial   . NEPHROLITHOTOMY    . PERIPHERAL VASCULAR CATHETERIZATION N/A 01/30/2015   Procedure: IVC Filter Insertion;  Surgeon: Serafina Mitchell, MD;  Location: [place] [facility];  Service: Cardiovascular;  Laterality: N/A;  . PERIPHERAL VASCULAR CATHETERIZATION N/A 04/24/2015   Procedure: IVC Filter Removal;  Surgeon: Serafina Mitchell, MD;  Location: [place] [facility];  Service: Cardiovascular;  Laterality: N/A;  . PERIPHERAL VASCULAR CATHETERIZATION N/A 08/14/2015   Procedure: IVC Filter Removal;  Surgeon: Serafina Mitchell, MD;  Location: [place] [facility];  Service: Cardiovascular;  Laterality: N/A;  . SHOULDER ARTHROSCOPY WITH OPEN ROTATOR CUFF REPAIR Left 11/10/2019   Procedure: SHOULDER ARTHROSCOPY WITH OPEN ROTATOR CUFF REPAIR;  Surgeon: Carole Civil, MD;  [facility]: [facility];  Service: Orthopedics;  Laterality: Left;  pt tested + on 2/23, does not need Covid test < 90 days  .  SHOULDER ARTHROSCOPY WITH ROTATOR CUFF REPAIR AND OPEN BICEPS TENODESIS Left 02/15/2019   Procedure: SHOULDER ARTHROSCOPY WITH open ROTATOR CUFF REPAIR AND OPEN BICEPS TENODESIS;  Surgeon: Carole Civil, MD;  [facility]: [facility];  Service: Orthopedics;  Laterality: Left;  . SPINE SURGERY  2015   Dr Hal Neer Fusion   . SPINE SURGERY  1999   discectomy  . TOTAL KNEE ARTHROPLASTY Right 02/02/2015   Procedure:  RIGHT TOTAL KNEE ARTHROPLASTY;  Surgeon: Carole Civil, MD;  [facility];  Service: Orthopedics;  Laterality: Right;  LM with pt's daughter of new arrival time (10:45)    . TOTAL KNEE ARTHROPLASTY Left  07/24/2016   Procedure: TOTAL KNEE ARTHROPLASTY;  Surgeon: Carole Civil, MD;  [facility]: [facility];  Service: Orthopedics;  Laterality: Left;  . TOTAL SHOULDER ARTHROPLASTY Left 11/08/2020   Procedure: TOTAL SHOULDER ARTHROPLASTY;  Surgeon: Mordecai Rasmussen, MD;  [facility]: [facility];  Service: Orthopedics;  Laterality: Left;  Left shoulder Reverse Arthroplasty    There were no vitals filed for this visit.   Subjective Assessment - 12/31/20 1047    Subjective COVID-19 screen performed prior to patient entering clinic.  Stiff today.    Pertinent History Left reverse total shoulder replacement 11/08/2020, HTN, DM, history of bilateral TKA, history of spinal surgery    Patient Stated Goals move left arm better    Currently in Pain? Yes    Pain Score 4     Pain Location Shoulder    Pain Orientation Left    Pain Descriptors / Indicators Discomfort    Pain Type Surgical pain    Pain Onset More than a month ago                             [facility] Adult PT Treatment/Exercise - 12/31/20 0001      Vasopneumatic   Number Minutes Vasopneumatic  15 minutes    Vasopnuematic Location  --   LT shoulder.   Vasopneumatic Pressure Low      Manual Therapy   Manual Therapy Passive ROM    Passive ROM In supine:  PROM to patient's left shoulder x 23 minutes with LLLDS technique ultilzed.                    PT Short Term Goals - 11/19/20 0928      PT SHORT TERM GOAL #1   Title STG's=LTG's             PT Long Term Goals - 12/17/20 0818      PT LONG TERM GOAL #1   Title Patient will be independent with HEP and its progression.    Time 10    Period Weeks    Status On-going      PT LONG TERM GOAL #2   Title Patient will demonstrate 140+ degrees of left shoulder flexion AROM to improve overhead activities.    Time 10    Period Weeks    Status On-going      PT LONG TERM GOAL #3   Title Patient will demonstrate 55+ degrees of left shoulder ER AROM to improve  donning/doffing apparel.    Time 10    Period Weeks    Status On-going      PT LONG TERM GOAL #4   Title Patient will report ability to perform ADLs with left shoulder pain less than or equal to 4/10.    Time 10  Period Weeks    Status On-going      PT LONG TERM GOAL #5   Title Patient will demonstrate 4+/5  left shoulder MMT to improve stability during functional tasks.    Time 10    Period Weeks    Status On-going                 Plan - 12/31/20 1048    Clinical Impression Statement Patient states shoudler felt good yesterday but stiff today.  Patient needs continued PROM in all directions.  He felt good after treatment.    Personal Factors and Comorbidities Comorbidity 3+;Time since onset of injury/illness/exacerbation;Age    Comorbidities Left reverse total shoulder replacement 11/08/2020, HTN, DM, history of bilateral TKA, history of spinal surgery    Examination-Activity Limitations Sleep;Caring for Others;Reach Overhead;Dressing;Hygiene/Grooming;Bed Mobility    Stability/Clinical Decision Making Stable/Uncomplicated    Rehab Potential Good    PT Frequency 2x / week    PT Duration 8 weeks    PT Treatment/Interventions ADLs/Self Care Home Management;Cryotherapy;Electrical Stimulation;Moist Heat;Functional mobility training;Therapeutic activities;Therapeutic exercise;Neuromuscular re-education;Manual techniques;Passive range of motion;Patient/family education;Vasopneumatic Device;Taping    PT Next Visit Plan PROM to left shoulder for first 4-6 weeks then progress per protocol; modalities PRN for pain relief; NO IONTOPHORESIS PER AETNA    Consulted and Agree with Plan of Care Patient           Patient will benefit from skilled therapeutic intervention in order to improve the following deficits and impairments:  Decreased activity tolerance,Decreased strength,Increased edema,Postural dysfunction,Pain,Decreased range of motion,Impaired UE functional use  Visit  Diagnosis: Stiffness of left shoulder, not elsewhere classified  Acute pain of left shoulder     Problem List Patient Active Problem List   Diagnosis Date Noted  . Rotator cuff arthropathy of left shoulder 11/08/2020  . Abnormal feces 08/30/2020  . Chronic idiopathic constipation 08/30/2020  . Colon cancer screening 08/30/2020  . Family history of malignant neoplasm of gastrointestinal tract 08/30/2020  . Personal history of colonic polyps 08/30/2020  . Chest pain 11/28/2019  . Symptomatic bradycardia 11/28/2019  . Nontraumatic complete tear of left rotator cuff   . Hypertension 08/31/2019  . S/P left rotator cuff repair/ biceps tenodesis 02/15/19  03/01/2019  . Labral tear of long head of left biceps tendon   . Partial tear of left subscapularis tendon   . Traumatic complete tear of left rotator cuff   . Family history of malignant neoplasm of prostate 07/28/2018  . History of Graves' disease 11/11/2017  . S/P total knee replacement, right 02/02/15 08/12/2017  . S/P total knee replacement, left 07/24/16 08/12/2017  . Hypercholesterolemia 11/08/2015  . History of DVT (deep vein thrombosis) 11/04/2015  . History of cocaine abuse ([place]) 11/04/2015  . History of heroin abuse ([place]) 11/04/2015  . Obesity 11/04/2015  . Parasomnia 11/04/2015  . Benign paroxysmal positional vertigo 10/29/2015  . Controlled type 2 diabetes mellitus without complication ([place]) 19/50/9326  . Elevated prostate specific antigen (PSA) 10/29/2015  . HX: long term anticoagulant use 10/29/2015  . Sensorineural hearing loss 10/29/2015  . Sleep apnea with use of continuous positive airway pressure (CPAP) 10/29/2015  . Chronic radicular lumbar pain 10/29/2015  . Arthrofibrosis of total knee arthroplasty ([place])   . Pulmonary embolus ([place]) 01/09/2015  . History of pulmonary embolism 01/09/2015  . UTI (lower urinary tract infection) 02/21/2014  . Other headache syndrome 02/21/2014  . Nausea with vomiting 02/21/2014   . Rheumatoid arthritis ([place]) 02/21/2014  . Nausea & vomiting 02/21/2014  .  Headache(784.0) 02/21/2014  . Lumbar spinal stenosis 02/10/2014    Malyk Girouard, [place] MPT 12/31/2020, 10:51 AM  [facility] [address] [place], [place], [address] Phone: [phone]   Fax:  [phone]  Name: Willie Howe MRN: [id number] Date of Birth: [birth year]

## 2021-01-02 ENCOUNTER — Other Ambulatory Visit: Payer: Self-pay

## 2021-01-03 DIAGNOSIS — M0589 Other rheumatoid arthritis with rheumatoid factor of multiple sites: Secondary | ICD-10-CM | POA: Diagnosis not present

## 2021-01-03 DIAGNOSIS — Z79899 Other long term (current) drug therapy: Secondary | ICD-10-CM | POA: Diagnosis not present

## 2021-01-03 MED ORDER — HYDROCODONE-ACETAMINOPHEN 5-325 MG PO TABS: 1.0000 | ORAL_TABLET | Freq: Three times a day (TID) | ORAL | 0 refills | Status: DC | PRN

## 2021-01-04 ENCOUNTER — Other Ambulatory Visit: Payer: Self-pay | Admitting: Orthopedic Surgery

## 2021-01-04 DIAGNOSIS — S46012A Strain of muscle(s) and tendon(s) of the rotator cuff of left shoulder, initial encounter: Secondary | ICD-10-CM

## 2021-01-07 ENCOUNTER — Other Ambulatory Visit: Payer: Self-pay

## 2021-01-07 ENCOUNTER — Ambulatory Visit: Payer: Medicare HMO | Admitting: Physical Therapy

## 2021-01-07 DIAGNOSIS — M25512 Pain in left shoulder: Secondary | ICD-10-CM

## 2021-01-07 DIAGNOSIS — M25612 Stiffness of left shoulder, not elsewhere classified: Secondary | ICD-10-CM | POA: Diagnosis not present

## 2021-01-07 DIAGNOSIS — M6281 Muscle weakness (generalized): Secondary | ICD-10-CM | POA: Diagnosis not present

## 2021-01-07 DIAGNOSIS — G8929 Other chronic pain: Secondary | ICD-10-CM | POA: Diagnosis not present

## 2021-01-07 NOTE — Therapy (Signed)
[facility] [place], [place], [address] Phone: [phone]   Fax:  [phone]  Physical Therapy Treatment  Patient Details  Name: Willie Howe MRN: [id number] Date of Birth: 1960/09/05 Referring Provider (PT): Larena Glassman, MD   Encounter Date: 01/07/2021   PT End of Session - 01/07/21 0818    Visit Number 8    Number of Visits 16    Date for PT Re-Evaluation 12/31/20    Authorization Type Aetna Medicare (CQ and KX modifier); FOTO initial 96% limitation; progress note every 10th visit    PT Start Time 0815    PT Stop Time 0907    PT Time Calculation (min) 52 min    Activity Tolerance Patient tolerated treatment well    Behavior During Therapy [facility] for tasks assessed/performed           Past Medical History:  Diagnosis Date  . BPPV (benign paroxysmal positional vertigo)   . DVT (deep venous thrombosis) ([place])   . Graves disease 1996  . History of kidney stones   . History of pulmonary embolism   . Hypertension   . Obesity   . Rheumatoid arthritis ([place])   . Sleep apnea    Intolerant of CPAP  . Type 2 diabetes mellitus ([place])     Past Surgical History:  Procedure Laterality Date  . BACK SURGERY  2015  . COLONOSCOPY    . EXAM UNDER ANESTHESIA WITH MANIPULATION OF KNEE Right 04/18/2015   Procedure: MANIPULATION OF RIGHT KNEE UNDER ANESTHESIA;  Surgeon: Carole Civil, MD;  Location: [facility];  Service: Orthopedics;  Laterality: Right;  . IVC Filter     removed December 2016  . [place]  2010  . KNEE ARTHROSCOPY Right 2006   meniscus   . KNEE ARTHROSCOPY WITH MEDIAL MENISECTOMY Left 04/24/2016   Procedure: LEFT KNEE ARTHROSCOPY WITH PARTIAL MEDIAL MENISECTOMY;  Surgeon: Carole Civil, MD;  Location: [facility];  Service: Orthopedics;  Laterality: Left;  . KNEE SURGERY     x 2  . KNEE SURGERY Left 1976   ?ligament repair   . LEFT HEART CATH AND CORONARY ANGIOGRAPHY N/A 11/29/2019   Procedure:  LEFT HEART CATH AND CORONARY ANGIOGRAPHY;  Surgeon: Martinique, Peter M, MD;  Location: [place] [facility];  Service: Cardiovascular;  Laterality: N/A;  . LUMBAR WOUND DEBRIDEMENT N/A 02/23/2014   Procedure: LUMBAR WOUND DEBRIDEMENT;  Surgeon: Faythe Ghee, MD;  Location: [place];  Service: Neurosurgery;  Laterality: N/A;  exploration lumbar wound. repair of dural defect.  Marland Kitchen MENISECTOMY Right    open medial   . NEPHROLITHOTOMY    . PERIPHERAL VASCULAR CATHETERIZATION N/A 01/30/2015   Procedure: IVC Filter Insertion;  Surgeon: Serafina Mitchell, MD;  Location: [place] [facility];  Service: Cardiovascular;  Laterality: N/A;  . PERIPHERAL VASCULAR CATHETERIZATION N/A 04/24/2015   Procedure: IVC Filter Removal;  Surgeon: Serafina Mitchell, MD;  Location: [place] [facility];  Service: Cardiovascular;  Laterality: N/A;  . PERIPHERAL VASCULAR CATHETERIZATION N/A 08/14/2015   Procedure: IVC Filter Removal;  Surgeon: Serafina Mitchell, MD;  Location: [place] [facility];  Service: Cardiovascular;  Laterality: N/A;  . SHOULDER ARTHROSCOPY WITH OPEN ROTATOR CUFF REPAIR Left 11/10/2019   Procedure: SHOULDER ARTHROSCOPY WITH OPEN ROTATOR CUFF REPAIR;  Surgeon: Carole Civil, MD;  [facility];  Service: Orthopedics;  Laterality: Left;  pt tested + on 2/23, does not need Covid test < 90 days  .  SHOULDER ARTHROSCOPY WITH ROTATOR CUFF REPAIR AND OPEN BICEPS TENODESIS Left 02/15/2019   Procedure: SHOULDER ARTHROSCOPY WITH open ROTATOR CUFF REPAIR AND OPEN BICEPS TENODESIS;  Surgeon: Carole Civil, MD;  [facility];  Service: Orthopedics;  Laterality: Left;  . SPINE SURGERY  2015   Dr Hal Neer Fusion   . SPINE SURGERY  1999   discectomy  . TOTAL KNEE ARTHROPLASTY Right 02/02/2015   Procedure:  RIGHT TOTAL KNEE ARTHROPLASTY;  Surgeon: Carole Civil, MD;  [facility];  Service: Orthopedics;  Laterality: Right;  LM with pt's daughter of new arrival time (10:45)    . TOTAL KNEE ARTHROPLASTY Left  07/24/2016   Procedure: TOTAL KNEE ARTHROPLASTY;  Surgeon: Carole Civil, MD;  [facility];  Service: Orthopedics;  Laterality: Left;  . TOTAL SHOULDER ARTHROPLASTY Left 11/08/2020   Procedure: TOTAL SHOULDER ARTHROPLASTY;  Surgeon: Mordecai Rasmussen, MD;  [facility];  Service: Orthopedics;  Laterality: Left;  Left shoulder Reverse Arthroplasty    There were no vitals filed for this visit.   Subjective Assessment - 01/07/21 0814    Subjective COVID-19 screen performed prior to patient entering clinic. Patient arrived with some ongoing stiffness and pain yet a little less pain this week.    Pertinent History Left reverse total shoulder replacement 11/08/2020, HTN, DM, history of bilateral TKA, history of spinal surgery    Limitations Lifting;House hold activities    Patient Stated Goals move left arm better    Currently in Pain? Yes    Pain Score 3     Pain Location Shoulder    Pain Orientation Left    Pain Descriptors / Indicators Discomfort    Pain Type Surgical pain    Pain Onset More than a month ago    Pain Frequency Constant    Aggravating Factors  movement of shouder    Pain Relieving Factors at rest              [facility] PT Assessment - 01/07/21 0001      ROM / Strength   AROM / PROM / Strength AROM;PROM      AROM   AROM Assessment Site Shoulder    Right/Left Shoulder Left    Left Shoulder External Rotation 45 Degrees      PROM   PROM Assessment Site Shoulder    Right/Left Shoulder Left    Left Shoulder Flexion 118 Degrees    Left Shoulder External Rotation 55 Degrees                         [facility] Adult PT Treatment/Exercise - 01/07/21 0001      Exercises   Exercises Shoulder      Shoulder Exercises: Supine   Other Supine Exercises Supine cane for ER and flexion 2x10      Shoulder Exercises: Pulleys   Flexion 3 minutes;1 minute    Other Pulley Exercises standing UE ranger for small circles and elevation 2x10      Vasopneumatic    Number Minutes Vasopneumatic  15 minutes    Vasopnuematic Location  Shoulder    Vasopneumatic Pressure Low    Vasopneumatic Temperature  34 for pain and edema      Manual Therapy   Manual Therapy Passive ROM    Manual therapy comments manual PROM to left shoulder for flexion, ER, and abd to improve mobility  PT Short Term Goals - 11/19/20 0928      PT SHORT TERM GOAL #1   Title STG's=LTG's             PT Long Term Goals - 01/07/21 0819      PT LONG TERM GOAL #1   Title Patient will be independent with HEP and its progression.    Time 10    Period Weeks    Status On-going      PT LONG TERM GOAL #2   Title Patient will demonstrate 140+ degrees of left shoulder flexion AROM to improve overhead activities.    Time 10    Period Weeks    Status On-going      PT LONG TERM GOAL #3   Title Patient will demonstrate 55+ degrees of left shoulder ER AROM to improve donning/doffing apparel.    Baseline AROM 45 degrees 01/07/21    Time 10    Period Weeks    Status On-going      PT LONG TERM GOAL #4   Title Patient will report ability to perform ADLs with left shoulder pain less than or equal to 4/10.    Time 10    Period Weeks    Status On-going      PT LONG TERM GOAL #5   Title Patient will demonstrate 4+/5  left shoulder MMT to improve stability during functional tasks.    Time 10    Period Weeks    Status On-going                 Plan - 01/07/21 0827    Clinical Impression Statement Patient tolerated treatment well with progression with some fatigue. Patient reported less pain today and doing well with left shoulder with light ADL's. Patient has improved with PROM today. Goals progressing.    Personal Factors and Comorbidities Comorbidity 3+;Time since onset of injury/illness/exacerbation;Age    Comorbidities Left reverse total shoulder replacement 11/08/2020, HTN, DM, history of bilateral TKA, history of spinal surgery     Examination-Activity Limitations Sleep;Caring for Others;Reach Overhead;Dressing;Hygiene/Grooming;Bed Mobility    Stability/Clinical Decision Making Stable/Uncomplicated    Rehab Potential Good    PT Frequency 2x / week    PT Duration 8 weeks    PT Treatment/Interventions ADLs/Self Care Home Management;Cryotherapy;Electrical Stimulation;Moist Heat;Functional mobility training;Therapeutic activities;Therapeutic exercise;Neuromuscular re-education;Manual techniques;Passive range of motion;Patient/family education;Vasopneumatic Device;Taping    PT Next Visit Plan PROM to left shoulder (01/03/21 8 weeks) progress per protocol avoid IR and ext for 12 weeks; modalities PRN for pain relief; NO IONTOPHORESIS PER AETNA    Consulted and Agree with Plan of Care Patient           Patient will benefit from skilled therapeutic intervention in order to improve the following deficits and impairments:  Decreased activity tolerance,Decreased strength,Increased edema,Postural dysfunction,Pain,Decreased range of motion,Impaired UE functional use  Visit Diagnosis: Stiffness of left shoulder, not elsewhere classified  Acute pain of left shoulder  Muscle weakness (generalized)  Chronic left shoulder pain     Problem List Patient Active Problem List   Diagnosis Date Noted  . Rotator cuff arthropathy of left shoulder 11/08/2020  . Abnormal feces 08/30/2020  . Chronic idiopathic constipation 08/30/2020  . Colon cancer screening 08/30/2020  . Family history of malignant neoplasm of gastrointestinal tract 08/30/2020  . Personal history of colonic polyps 08/30/2020  . Chest pain 11/28/2019  . Symptomatic bradycardia 11/28/2019  . Nontraumatic complete tear of left rotator cuff   . Hypertension 08/31/2019  .  S/P left rotator cuff repair/ biceps tenodesis 02/15/19  03/01/2019  . Labral tear of long head of left biceps tendon   . Partial tear of left subscapularis tendon   . Traumatic complete tear of left  rotator cuff   . Family history of malignant neoplasm of prostate 07/28/2018  . History of Graves' disease 11/11/2017  . S/P total knee replacement, right 02/02/15 08/12/2017  . S/P total knee replacement, left 07/24/16 08/12/2017  . Hypercholesterolemia 11/08/2015  . History of DVT (deep vein thrombosis) 11/04/2015  . History of cocaine abuse ([place]) 11/04/2015  . History of heroin abuse ([place]) 11/04/2015  . Obesity 11/04/2015  . Parasomnia 11/04/2015  . Benign paroxysmal positional vertigo 10/29/2015  . Controlled type 2 diabetes mellitus without complication ([place]) 25/42/7062  . Elevated prostate specific antigen (PSA) 10/29/2015  . HX: long term anticoagulant use 10/29/2015  . Sensorineural hearing loss 10/29/2015  . Sleep apnea with use of continuous positive airway pressure (CPAP) 10/29/2015  . Chronic radicular lumbar pain 10/29/2015  . Arthrofibrosis of total knee arthroplasty ([place])   . Pulmonary embolus ([place]) 01/09/2015  . History of pulmonary embolism 01/09/2015  . UTI (lower urinary tract infection) 02/21/2014  . Other headache syndrome 02/21/2014  . Nausea with vomiting 02/21/2014  . Rheumatoid arthritis ([place]) 02/21/2014  . Nausea & vomiting 02/21/2014  . Headache(784.0) 02/21/2014  . Lumbar spinal stenosis 02/10/2014    Phillips Climes 01/07/2021, 9:41 AM  [facility] [place], [place], [address] Phone: [phone]   Fax:  [phone]  Name: Willie Howe MRN: [id number] Date of Birth: February 01, 1961

## 2021-01-07 NOTE — Therapy (Signed)
[facility] [place], [place], [address] Phone: [phone]   Fax:  [phone]  Physical Therapy Treatment  Patient Details  Name: Willie Howe MRN: [id number] Date of Birth: [birth year] Referring Provider (PT): Larena Glassman, MD   Encounter Date: 01/07/2021   PT End of Session - 01/07/21 0818    Visit Number 8    Number of Visits 16    Date for PT Re-Evaluation 12/31/20    Authorization Type Aetna Medicare (CQ and KX modifier); FOTO initial 96% limitation; progress note every 10th visit    PT Start Time 0815    PT Stop Time 0907    PT Time Calculation (min) 52 min    Activity Tolerance Patient tolerated treatment well    Behavior During Therapy [facility] for tasks assessed/performed           Past Medical History:  Diagnosis Date  . BPPV (benign paroxysmal positional vertigo)   . DVT (deep venous thrombosis) ([place])   . Graves disease 1996  . History of kidney stones   . History of pulmonary embolism   . Hypertension   . Obesity   . Rheumatoid arthritis ([place])   . Sleep apnea    Intolerant of CPAP  . Type 2 diabetes mellitus ([place])     Past Surgical History:  Procedure Laterality Date  . BACK SURGERY  2015  . COLONOSCOPY    . EXAM UNDER ANESTHESIA WITH MANIPULATION OF KNEE Right 04/18/2015   Procedure: MANIPULATION OF RIGHT KNEE UNDER ANESTHESIA;  Surgeon: Carole Civil, MD;  [facility]: [facility];  Service: Orthopedics;  Laterality: Right;  . IVC Filter     removed December 2016  . [place]  2010  . KNEE ARTHROSCOPY Right 2006   meniscus   . KNEE ARTHROSCOPY WITH MEDIAL MENISECTOMY Left 04/24/2016   Procedure: LEFT KNEE ARTHROSCOPY WITH PARTIAL MEDIAL MENISECTOMY;  Surgeon: Carole Civil, MD;  Location: [facility];  Service: Orthopedics;  Laterality: Left;  . KNEE SURGERY     x 2  . KNEE SURGERY Left 1976   ?ligament repair   . LEFT HEART CATH AND CORONARY ANGIOGRAPHY N/A 11/29/2019   Procedure:  LEFT HEART CATH AND CORONARY ANGIOGRAPHY;  Surgeon: Martinique, Peter M, MD;  Location: [place] [facility];  Service: Cardiovascular;  Laterality: N/A;  . LUMBAR WOUND DEBRIDEMENT N/A 02/23/2014   Procedure: LUMBAR WOUND DEBRIDEMENT;  Surgeon: Faythe Ghee, MD;  Location: [place];  Service: Neurosurgery;  Laterality: N/A;  exploration lumbar wound. repair of dural defect.  Marland Kitchen MENISECTOMY Right    open medial   . NEPHROLITHOTOMY    . PERIPHERAL VASCULAR CATHETERIZATION N/A 01/30/2015   Procedure: IVC Filter Insertion;  Surgeon: Serafina Mitchell, MD;  Location: [place] [facility];  Service: Cardiovascular;  Laterality: N/A;  . PERIPHERAL VASCULAR CATHETERIZATION N/A 04/24/2015   Procedure: IVC Filter Removal;  Surgeon: Serafina Mitchell, MD;  Location: [place] [facility];  Service: Cardiovascular;  Laterality: N/A;  . PERIPHERAL VASCULAR CATHETERIZATION N/A 08/14/2015   Procedure: IVC Filter Removal;  Surgeon: Serafina Mitchell, MD;  Location: [place] [facility];  Service: Cardiovascular;  Laterality: N/A;  . SHOULDER ARTHROSCOPY WITH OPEN ROTATOR CUFF REPAIR Left 11/10/2019   Procedure: SHOULDER ARTHROSCOPY WITH OPEN ROTATOR CUFF REPAIR;  Surgeon: Carole Civil, MD;  [facility]: [facility];  Service: Orthopedics;  Laterality: Left;  pt tested + on 2/23, does not need Covid test < 90 days  .  SHOULDER ARTHROSCOPY WITH ROTATOR CUFF REPAIR AND OPEN BICEPS TENODESIS Left 02/15/2019   Procedure: SHOULDER ARTHROSCOPY WITH open ROTATOR CUFF REPAIR AND OPEN BICEPS TENODESIS;  Surgeon: Carole Civil, MD;  Location: [facility];  Service: Orthopedics;  Laterality: Left;  . SPINE SURGERY  2015   Dr Hal Neer Fusion   . SPINE SURGERY  1999   discectomy  . TOTAL KNEE ARTHROPLASTY Right 02/02/2015   Procedure:  RIGHT TOTAL KNEE ARTHROPLASTY;  Surgeon: Carole Civil, MD;  [facility]: [facility];  Service: Orthopedics;  Laterality: Right;  LM with pt's daughter of new arrival time (10:45)    . TOTAL KNEE ARTHROPLASTY Left  07/24/2016   Procedure: TOTAL KNEE ARTHROPLASTY;  Surgeon: Carole Civil, MD;  [facility];  Service: Orthopedics;  Laterality: Left;  . TOTAL SHOULDER ARTHROPLASTY Left 11/08/2020   Procedure: TOTAL SHOULDER ARTHROPLASTY;  Surgeon: Mordecai Rasmussen, MD;  [facility];  Service: Orthopedics;  Laterality: Left;  Left shoulder Reverse Arthroplasty    There were no vitals filed for this visit.   Subjective Assessment - 01/07/21 0814    Subjective COVID-19 screen performed prior to patient entering clinic. Patient arrived with some ongoing stiffness and pain yet a little less pain this week.    Pertinent History Left reverse total shoulder replacement 11/08/2020, HTN, DM, history of bilateral TKA, history of spinal surgery    Limitations Lifting;House hold activities    Patient Stated Goals move left arm better    Currently in Pain? Yes    Pain Score 3     Pain Location Shoulder    Pain Orientation Left    Pain Descriptors / Indicators Discomfort    Pain Type Surgical pain    Pain Onset More than a month ago    Pain Frequency Constant    Aggravating Factors  movement of shouder    Pain Relieving Factors at rest              [facility] PT Assessment - 01/07/21 0001      ROM / Strength   AROM / PROM / Strength AROM;PROM      AROM   AROM Assessment Site Shoulder    Right/Left Shoulder Left    Left Shoulder External Rotation 45 Degrees      PROM   PROM Assessment Site Shoulder    Right/Left Shoulder Left    Left Shoulder Flexion 118 Degrees    Left Shoulder External Rotation 55 Degrees                         [facility] Adult PT Treatment/Exercise - 01/07/21 0001      Exercises   Exercises Shoulder      Shoulder Exercises: Supine   Other Supine Exercises Supine cane for ER and flexion 2x10      Shoulder Exercises: Pulleys   Flexion 3 minutes;1 minute    Other Pulley Exercises standing UE ranger for small circles and elevation 2x10      Vasopneumatic    Number Minutes Vasopneumatic  15 minutes    Vasopnuematic Location  Shoulder    Vasopneumatic Pressure Low    Vasopneumatic Temperature  34 for pain and edema      Manual Therapy   Manual Therapy Passive ROM    Manual therapy comments manual PROM to left shoulder for flexion, ER, IR and abd to improve mobility  PT Short Term Goals - 11/19/20 0928      PT SHORT TERM GOAL #1   Title STG's=LTG's             PT Long Term Goals - 01/07/21 0819      PT LONG TERM GOAL #1   Title Patient will be independent with HEP and its progression.    Time 10    Period Weeks    Status On-going      PT LONG TERM GOAL #2   Title Patient will demonstrate 140+ degrees of left shoulder flexion AROM to improve overhead activities.    Time 10    Period Weeks    Status On-going      PT LONG TERM GOAL #3   Title Patient will demonstrate 55+ degrees of left shoulder ER AROM to improve donning/doffing apparel.    Baseline AROM 45 degrees 01/07/21    Time 10    Period Weeks    Status On-going      PT LONG TERM GOAL #4   Title Patient will report ability to perform ADLs with left shoulder pain less than or equal to 4/10.    Time 10    Period Weeks    Status On-going      PT LONG TERM GOAL #5   Title Patient will demonstrate 4+/5  left shoulder MMT to improve stability during functional tasks.    Time 10    Period Weeks    Status On-going                 Plan - 01/07/21 0827    Clinical Impression Statement Patient tolerated treatment well with progression with some fatigue. Patient reported less pain today and doing well with left shoulder with light ADL's. Patient has improved with PROM today. Goals progressing.    Personal Factors and Comorbidities Comorbidity 3+;Time since onset of injury/illness/exacerbation;Age    Comorbidities Left reverse total shoulder replacement 11/08/2020, HTN, DM, history of bilateral TKA, history of spinal surgery     Examination-Activity Limitations Sleep;Caring for Others;Reach Overhead;Dressing;Hygiene/Grooming;Bed Mobility    Stability/Clinical Decision Making Stable/Uncomplicated    Rehab Potential Good    PT Frequency 2x / week    PT Duration 8 weeks    PT Treatment/Interventions ADLs/Self Care Home Management;Cryotherapy;Electrical Stimulation;Moist Heat;Functional mobility training;Therapeutic activities;Therapeutic exercise;Neuromuscular re-education;Manual techniques;Passive range of motion;Patient/family education;Vasopneumatic Device;Taping    PT Next Visit Plan PROM to left shoulder (01/03/21 8 weeks) progress per protocol; modalities PRN for pain relief; NO IONTOPHORESIS PER AETNA    Consulted and Agree with Plan of Care Patient           Patient will benefit from skilled therapeutic intervention in order to improve the following deficits and impairments:  Decreased activity tolerance,Decreased strength,Increased edema,Postural dysfunction,Pain,Decreased range of motion,Impaired UE functional use  Visit Diagnosis: Stiffness of left shoulder, not elsewhere classified  Acute pain of left shoulder  Muscle weakness (generalized)  Chronic left shoulder pain     Problem List Patient Active Problem List   Diagnosis Date Noted  . Rotator cuff arthropathy of left shoulder 11/08/2020  . Abnormal feces 08/30/2020  . Chronic idiopathic constipation 08/30/2020  . Colon cancer screening 08/30/2020  . Family history of malignant neoplasm of gastrointestinal tract 08/30/2020  . Personal history of colonic polyps 08/30/2020  . Chest pain 11/28/2019  . Symptomatic bradycardia 11/28/2019  . Nontraumatic complete tear of left rotator cuff   . Hypertension 08/31/2019  . S/P left rotator cuff repair/ biceps  tenodesis 02/15/19  03/01/2019  . Labral tear of long head of left biceps tendon   . Partial tear of left subscapularis tendon   . Traumatic complete tear of left rotator cuff   . Family  history of malignant neoplasm of prostate 07/28/2018  . History of Graves' disease 11/11/2017  . S/P total knee replacement, right 02/02/15 08/12/2017  . S/P total knee replacement, left 07/24/16 08/12/2017  . Hypercholesterolemia 11/08/2015  . History of DVT (deep vein thrombosis) 11/04/2015  . History of cocaine abuse ([place]) 11/04/2015  . History of heroin abuse ([place]) 11/04/2015  . Obesity 11/04/2015  . Parasomnia 11/04/2015  . Benign paroxysmal positional vertigo 10/29/2015  . Controlled type 2 diabetes mellitus without complication ([place]) 40/97/3532  . Elevated prostate specific antigen (PSA) 10/29/2015  . HX: long term anticoagulant use 10/29/2015  . Sensorineural hearing loss 10/29/2015  . Sleep apnea with use of continuous positive airway pressure (CPAP) 10/29/2015  . Chronic radicular lumbar pain 10/29/2015  . Arthrofibrosis of total knee arthroplasty ([place])   . Pulmonary embolus ([place]) 01/09/2015  . History of pulmonary embolism 01/09/2015  . UTI (lower urinary tract infection) 02/21/2014  . Other headache syndrome 02/21/2014  . Nausea with vomiting 02/21/2014  . Rheumatoid arthritis ([place]) 02/21/2014  . Nausea & vomiting 02/21/2014  . Headache(784.0) 02/21/2014  . Lumbar spinal stenosis 02/10/2014    Quincy Boy P, PTA 01/07/2021, 9:10 AM  [facility] [place], [place], [address] Phone: [phone]   Fax:  [phone]  Name: FYNN VANBLARCOM MRN: [id number] Date of Birth: 10-18-1960

## 2021-01-10 ENCOUNTER — Other Ambulatory Visit: Payer: Self-pay

## 2021-01-10 MED ORDER — HYDROCODONE-ACETAMINOPHEN 5-325 MG PO TABS: 1.0000 | ORAL_TABLET | Freq: Three times a day (TID) | ORAL | 0 refills | Status: DC | PRN

## 2021-01-15 ENCOUNTER — Encounter: Payer: Self-pay | Admitting: Physical Therapy

## 2021-01-15 ENCOUNTER — Ambulatory Visit: Payer: Medicare HMO | Admitting: Physical Therapy

## 2021-01-15 ENCOUNTER — Other Ambulatory Visit: Payer: Self-pay

## 2021-01-15 DIAGNOSIS — M25612 Stiffness of left shoulder, not elsewhere classified: Secondary | ICD-10-CM | POA: Diagnosis not present

## 2021-01-15 DIAGNOSIS — G8929 Other chronic pain: Secondary | ICD-10-CM | POA: Diagnosis not present

## 2021-01-15 DIAGNOSIS — M6281 Muscle weakness (generalized): Secondary | ICD-10-CM

## 2021-01-15 DIAGNOSIS — M25512 Pain in left shoulder: Secondary | ICD-10-CM

## 2021-01-15 NOTE — Therapy (Signed)
[facility] [place], [place], [address] Phone: [phone]   Fax:  [phone]  Physical Therapy Treatment  Patient Details  Name: Willie Howe MRN: [id number] Date of Birth: 08/18/1961 Referring Provider (PT): Larena Glassman, MD   Encounter Date: 01/15/2021   PT End of Session - 01/15/21 0819    Visit Number 9    Number of Visits 16    Date for PT Re-Evaluation 12/31/20    Authorization Type Aetna Medicare (CQ and KX modifier); FOTO initial 96% limitation; progress note every 10th visit    PT Start Time 0817    PT Stop Time 0900    PT Time Calculation (min) 43 min    Activity Tolerance Patient tolerated treatment well    Behavior During Therapy [facility] for tasks assessed/performed           Past Medical History:  Diagnosis Date  . BPPV (benign paroxysmal positional vertigo)   . DVT (deep venous thrombosis) ([place])   . Graves disease 1996  . History of kidney stones   . History of pulmonary embolism   . Hypertension   . Obesity   . Rheumatoid arthritis ([place])   . Sleep apnea    Intolerant of CPAP  . Type 2 diabetes mellitus ([place])     Past Surgical History:  Procedure Laterality Date  . BACK SURGERY  2015  . COLONOSCOPY    . EXAM UNDER ANESTHESIA WITH MANIPULATION OF KNEE Right 04/18/2015   Procedure: MANIPULATION OF RIGHT KNEE UNDER ANESTHESIA;  Surgeon: Carole Civil, MD;  [facility]: [facility];  Service: Orthopedics;  Laterality: Right;  . IVC Filter     removed December 2016  . [place]  2010  . KNEE ARTHROSCOPY Right 2006   meniscus   . KNEE ARTHROSCOPY WITH MEDIAL MENISECTOMY Left 04/24/2016   Procedure: LEFT KNEE ARTHROSCOPY WITH PARTIAL MEDIAL MENISECTOMY;  Surgeon: Carole Civil, MD;  [facility]: [facility];  Service: Orthopedics;  Laterality: Left;  . KNEE SURGERY     x 2  . KNEE SURGERY Left 1976   ?ligament repair   . LEFT HEART CATH AND CORONARY ANGIOGRAPHY N/A 11/29/2019   Procedure:  LEFT HEART CATH AND CORONARY ANGIOGRAPHY;  Surgeon: Martinique, Peter M, MD;  Location: [place] [facility];  Service: Cardiovascular;  Laterality: N/A;  . LUMBAR WOUND DEBRIDEMENT N/A 02/23/2014   Procedure: LUMBAR WOUND DEBRIDEMENT;  Surgeon: Faythe Ghee, MD;  Location: [place];  Service: Neurosurgery;  Laterality: N/A;  exploration lumbar wound. repair of dural defect.  Marland Kitchen MENISECTOMY Right    open medial   . NEPHROLITHOTOMY    . PERIPHERAL VASCULAR CATHETERIZATION N/A 01/30/2015   Procedure: IVC Filter Insertion;  Surgeon: Serafina Mitchell, MD;  Location: [place] [facility];  Service: Cardiovascular;  Laterality: N/A;  . PERIPHERAL VASCULAR CATHETERIZATION N/A 04/24/2015   Procedure: IVC Filter Removal;  Surgeon: Serafina Mitchell, MD;  Location: [place] [facility];  Service: Cardiovascular;  Laterality: N/A;  . PERIPHERAL VASCULAR CATHETERIZATION N/A 08/14/2015   Procedure: IVC Filter Removal;  Surgeon: Serafina Mitchell, MD;  Location: [place] [facility];  Service: Cardiovascular;  Laterality: N/A;  . SHOULDER ARTHROSCOPY WITH OPEN ROTATOR CUFF REPAIR Left 11/10/2019   Procedure: SHOULDER ARTHROSCOPY WITH OPEN ROTATOR CUFF REPAIR;  Surgeon: Carole Civil, MD;  [facility]: [facility];  Service: Orthopedics;  Laterality: Left;  pt tested + on 2/23, does not need Covid test < 90 days  .  SHOULDER ARTHROSCOPY WITH ROTATOR CUFF REPAIR AND OPEN BICEPS TENODESIS Left 02/15/2019   Procedure: SHOULDER ARTHROSCOPY WITH open ROTATOR CUFF REPAIR AND OPEN BICEPS TENODESIS;  Surgeon: Carole Civil, MD;  [facility];  Service: Orthopedics;  Laterality: Left;  . SPINE SURGERY  2015   Dr Hal Neer Fusion   . SPINE SURGERY  1999   discectomy  . TOTAL KNEE ARTHROPLASTY Right 02/02/2015   Procedure:  RIGHT TOTAL KNEE ARTHROPLASTY;  Surgeon: Carole Civil, MD;  [facility];  Service: Orthopedics;  Laterality: Right;  LM with pt's daughter of new arrival time (10:45)    . TOTAL KNEE ARTHROPLASTY Left  07/24/2016   Procedure: TOTAL KNEE ARTHROPLASTY;  Surgeon: Carole Civil, MD;  [facility];  Service: Orthopedics;  Laterality: Left;  . TOTAL SHOULDER ARTHROPLASTY Left 11/08/2020   Procedure: TOTAL SHOULDER ARTHROPLASTY;  Surgeon: Mordecai Rasmussen, MD;  [facility];  Service: Orthopedics;  Laterality: Left;  Left shoulder Reverse Arthroplasty    There were no vitals filed for this visit.   Subjective Assessment - 01/15/21 0818    Subjective COVID-19 screen performed prior to patient entering clinic. Patient arrived with reports of 3/10 shoulder discomfort today. Having more LBP.    Pertinent History Left reverse total shoulder replacement 11/08/2020, HTN, DM, history of bilateral TKA, history of spinal surgery    Limitations Lifting;House hold activities    Patient Stated Goals move left arm better    Currently in Pain? Yes    Pain Score 3     Pain Location Shoulder    Pain Orientation Left    Pain Descriptors / Indicators Discomfort    Pain Type Surgical pain    Pain Onset More than a month ago    Pain Frequency Intermittent              [facility] PT Assessment - 01/15/21 0001      Assessment   Medical Diagnosis Chronic left shoulder pain; S/P L reverse total shoulder arthroplasty    Referring Provider (PT) Larena Glassman, MD    Onset Date/Surgical Date 11/08/20    Hand Dominance Right    Next MD Visit 01/2021    Prior Therapy yes      Precautions   Precautions Shoulder    Type of Shoulder Precautions See Dr. Amedeo Kinsman' reverse TSA protocol                         [facility] Adult PT Treatment/Exercise - 01/15/21 0001      Shoulder Exercises: Supine   Protraction AROM;Left;20 reps    Flexion AROM;Left;20 reps    Flexion Limitations elbow flexed      Shoulder Exercises: Standing   External Rotation Strengthening;Left;20 reps;Theraband    Theraband Level (Shoulder External Rotation) Level 1 (Yellow)    Flexion AROM;Left;5 reps    Flexion Limitations  limited in antigravity; elbow flexed    Row Strengthening;Left;20 reps;Theraband    Theraband Level (Shoulder Row) Level 1 (Yellow)      Shoulder Exercises: Pulleys   Flexion 5 minutes    Other Pulley Exercises standing UE ranger for small circles and elevation 2x10      Modalities   Modalities Vasopneumatic      Vasopneumatic   Number Minutes Vasopneumatic  10 minutes    Vasopnuematic Location  Shoulder    Vasopneumatic Pressure Low    Vasopneumatic Temperature  34 for pain and edema  Manual Therapy   Manual Therapy Passive ROM    Passive ROM PROM of R shoulder into flexion, ER with holds at end range                    PT Short Term Goals - 11/19/20 0928      PT SHORT TERM GOAL #1   Title STG's=LTG's             PT Long Term Goals - 01/07/21 0819      PT LONG TERM GOAL #1   Title Patient will be independent with HEP and its progression.    Time 10    Period Weeks    Status On-going      PT LONG TERM GOAL #2   Title Patient will demonstrate 140+ degrees of left shoulder flexion AROM to improve overhead activities.    Time 10    Period Weeks    Status On-going      PT LONG TERM GOAL #3   Title Patient will demonstrate 55+ degrees of left shoulder ER AROM to improve donning/doffing apparel.    Baseline AROM 45 degrees 01/07/21    Time 10    Period Weeks    Status On-going      PT LONG TERM GOAL #4   Title Patient will report ability to perform ADLs with left shoulder pain less than or equal to 4/10.    Time 10    Period Weeks    Status On-going      PT LONG TERM GOAL #5   Title Patient will demonstrate 4+/5  left shoulder MMT to improve stability during functional tasks.    Time 10    Period Weeks    Status On-going                 Plan - 01/15/21 0856    Clinical Impression Statement Patient presented in clinic with reports of low level shoulder discomfort. Patient limited with antigravity shoulder flexion with elbow extended  but less limited with elbow flexed. Patient able to tolerate light progression into strengthening without complaint of pain. Weakness noted with ER with yellow theraband. Patient able to complete AROM shoulder protraction today independently. Firm end feels and smooth arc of motion noted during PROM of L shoulder. Normal vasopnuematic response noted following removal of the modality.    Personal Factors and Comorbidities Comorbidity 3+;Time since onset of injury/illness/exacerbation;Age    Comorbidities Left reverse total shoulder replacement 11/08/2020, HTN, DM, history of bilateral TKA, history of spinal surgery    Examination-Activity Limitations Sleep;Caring for Others;Reach Overhead;Dressing;Hygiene/Grooming;Bed Mobility    Stability/Clinical Decision Making Stable/Uncomplicated    Rehab Potential Good    PT Frequency 2x / week    PT Duration 8 weeks    PT Treatment/Interventions ADLs/Self Care Home Management;Cryotherapy;Electrical Stimulation;Moist Heat;Functional mobility training;Therapeutic activities;Therapeutic exercise;Neuromuscular re-education;Manual techniques;Passive range of motion;Patient/family education;Vasopneumatic Device;Taping    PT Next Visit Plan Progress into AAROM/AROM with light strengthening per protocol avoid IR and ext for 12 weeks; modalities PRN for pain relief; NO IONTOPHORESIS PER AETNA    PT Home Exercise Plan continue HEP provided by MD    Consulted and Agree with Plan of Care Patient           Patient will benefit from skilled therapeutic intervention in order to improve the following deficits and impairments:  Decreased activity tolerance,Decreased strength,Increased edema,Postural dysfunction,Pain,Decreased range of motion,Impaired UE functional use  Visit Diagnosis: Stiffness of left shoulder, not elsewhere classified  Acute pain of left shoulder  Muscle weakness (generalized)     Problem List Patient Active Problem List   Diagnosis Date Noted   . Rotator cuff arthropathy of left shoulder 11/08/2020  . Abnormal feces 08/30/2020  . Chronic idiopathic constipation 08/30/2020  . Colon cancer screening 08/30/2020  . Family history of malignant neoplasm of gastrointestinal tract 08/30/2020  . Personal history of colonic polyps 08/30/2020  . Chest pain 11/28/2019  . Symptomatic bradycardia 11/28/2019  . Nontraumatic complete tear of left rotator cuff   . Hypertension 08/31/2019  . S/P left rotator cuff repair/ biceps tenodesis 02/15/19  03/01/2019  . Labral tear of long head of left biceps tendon   . Partial tear of left subscapularis tendon   . Traumatic complete tear of left rotator cuff   . Family history of malignant neoplasm of prostate 07/28/2018  . History of Graves' disease 11/11/2017  . S/P total knee replacement, right 02/02/15 08/12/2017  . S/P total knee replacement, left 07/24/16 08/12/2017  . Hypercholesterolemia 11/08/2015  . History of DVT (deep vein thrombosis) 11/04/2015  . History of cocaine abuse ([place]) 11/04/2015  . History of heroin abuse ([place]) 11/04/2015  . Obesity 11/04/2015  . Parasomnia 11/04/2015  . Benign paroxysmal positional vertigo 10/29/2015  . Controlled type 2 diabetes mellitus without complication ([place]) 83/15/1761  . Elevated prostate specific antigen (PSA) 10/29/2015  . HX: long term anticoagulant use 10/29/2015  . Sensorineural hearing loss 10/29/2015  . Sleep apnea with use of continuous positive airway pressure (CPAP) 10/29/2015  . Chronic radicular lumbar pain 10/29/2015  . Arthrofibrosis of total knee arthroplasty ([place])   . Pulmonary embolus ([place]) 01/09/2015  . History of pulmonary embolism 01/09/2015  . UTI (lower urinary tract infection) 02/21/2014  . Other headache syndrome 02/21/2014  . Nausea with vomiting 02/21/2014  . Rheumatoid arthritis ([place]) 02/21/2014  . Nausea & vomiting 02/21/2014  . Headache(784.0) 02/21/2014  . Lumbar spinal stenosis 02/10/2014    Standley Brooking,  PTA 01/15/2021, 9:08 AM  [facility] [place], [place], [address] Phone: [phone]   Fax:  [phone]  Name: Willie Howe MRN: [id number] Date of Birth: [birth year]

## 2021-01-15 NOTE — Addendum Note (Signed)
Addended by: Dahmir Epperly, [place] W on: 01/15/2021 09:48 AM   Modules accepted: Orders

## 2021-01-16 ENCOUNTER — Other Ambulatory Visit: Payer: Self-pay

## 2021-01-17 MED ORDER — HYDROCODONE-ACETAMINOPHEN 5-325 MG PO TABS: 1.0000 | ORAL_TABLET | Freq: Three times a day (TID) | ORAL | 0 refills | Status: DC | PRN

## 2021-01-21 ENCOUNTER — Encounter: Payer: Medicare HMO | Admitting: Physical Therapy

## 2021-01-22 ENCOUNTER — Other Ambulatory Visit: Payer: Self-pay

## 2021-01-22 ENCOUNTER — Ambulatory Visit: Payer: Medicare HMO | Attending: Orthopedic Surgery | Admitting: Physical Therapy

## 2021-01-22 ENCOUNTER — Encounter: Payer: Self-pay | Admitting: Physical Therapy

## 2021-01-22 DIAGNOSIS — M25512 Pain in left shoulder: Secondary | ICD-10-CM | POA: Diagnosis not present

## 2021-01-22 DIAGNOSIS — M6281 Muscle weakness (generalized): Secondary | ICD-10-CM | POA: Insufficient documentation

## 2021-01-22 DIAGNOSIS — M25612 Stiffness of left shoulder, not elsewhere classified: Secondary | ICD-10-CM | POA: Insufficient documentation

## 2021-01-22 DIAGNOSIS — G8929 Other chronic pain: Secondary | ICD-10-CM | POA: Insufficient documentation

## 2021-01-22 NOTE — Therapy (Addendum)
[facility] [place], [place], [address] Phone: [phone]   Fax:  [phone]  Physical Therapy Treatment  Patient Details  Name: Willie Howe MRN: [id number] Date of Birth: 20-Nov-1960 Referring Provider (PT): Larena Glassman, MD   Encounter Date: 01/22/2021   PT End of Session - 01/22/21 0821    Visit Number 10    Number of Visits 16    Date for PT Re-Evaluation 02/11/21    Authorization Type Aetna Medicare (CQ and KX modifier); FOTO initial 96% limitation; progress note every 10th visit    PT Start Time 0816    PT Stop Time 0858    PT Time Calculation (min) 42 min    Activity Tolerance Patient tolerated treatment well    Behavior During Therapy [facility] for tasks assessed/performed           Past Medical History:  Diagnosis Date  . BPPV (benign paroxysmal positional vertigo)   . DVT (deep venous thrombosis) ([place])   . Graves disease 1996  . History of kidney stones   . History of pulmonary embolism   . Hypertension   . Obesity   . Rheumatoid arthritis ([place])   . Sleep apnea    Intolerant of CPAP  . Type 2 diabetes mellitus ([place])     Past Surgical History:  Procedure Laterality Date  . BACK SURGERY  2015  . COLONOSCOPY    . EXAM UNDER ANESTHESIA WITH MANIPULATION OF KNEE Right 04/18/2015   Procedure: MANIPULATION OF RIGHT KNEE UNDER ANESTHESIA;  Surgeon: Carole Civil, MD;  [facility]: [facility];  Service: Orthopedics;  Laterality: Right;  . IVC Filter     removed December 2016  . [place]  2010  . KNEE ARTHROSCOPY Right 2006   meniscus   . KNEE ARTHROSCOPY WITH MEDIAL MENISECTOMY Left 04/24/2016   Procedure: LEFT KNEE ARTHROSCOPY WITH PARTIAL MEDIAL MENISECTOMY;  Surgeon: Carole Civil, MD;  [facility]: [facility];  Service: Orthopedics;  Laterality: Left;  . KNEE SURGERY     x 2  . KNEE SURGERY Left 1976   ?ligament repair   . LEFT HEART CATH AND CORONARY ANGIOGRAPHY N/A 11/29/2019   Procedure:  LEFT HEART CATH AND CORONARY ANGIOGRAPHY;  Surgeon: Martinique, Peter M, MD;  Location: [place] [facility];  Service: Cardiovascular;  Laterality: N/A;  . LUMBAR WOUND DEBRIDEMENT N/A 02/23/2014   Procedure: LUMBAR WOUND DEBRIDEMENT;  Surgeon: Faythe Ghee, MD;  Location: [place];  Service: Neurosurgery;  Laterality: N/A;  exploration lumbar wound. repair of dural defect.  Marland Kitchen MENISECTOMY Right    open medial   . NEPHROLITHOTOMY    . PERIPHERAL VASCULAR CATHETERIZATION N/A 01/30/2015   Procedure: IVC Filter Insertion;  Surgeon: Serafina Mitchell, MD;  Location: [place] [facility];  Service: Cardiovascular;  Laterality: N/A;  . PERIPHERAL VASCULAR CATHETERIZATION N/A 04/24/2015   Procedure: IVC Filter Removal;  Surgeon: Serafina Mitchell, MD;  Location: [place] [facility];  Service: Cardiovascular;  Laterality: N/A;  . PERIPHERAL VASCULAR CATHETERIZATION N/A 08/14/2015   Procedure: IVC Filter Removal;  Surgeon: Serafina Mitchell, MD;  Location: [place] [facility];  Service: Cardiovascular;  Laterality: N/A;  . SHOULDER ARTHROSCOPY WITH OPEN ROTATOR CUFF REPAIR Left 11/10/2019   Procedure: SHOULDER ARTHROSCOPY WITH OPEN ROTATOR CUFF REPAIR;  Surgeon: Carole Civil, MD;  [facility];  Service: Orthopedics;  Laterality: Left;  pt tested + on 2/23, does not need Covid test < 90 days  .  SHOULDER ARTHROSCOPY WITH ROTATOR CUFF REPAIR AND OPEN BICEPS TENODESIS Left 02/15/2019   Procedure: SHOULDER ARTHROSCOPY WITH open ROTATOR CUFF REPAIR AND OPEN BICEPS TENODESIS;  Surgeon: Carole Civil, MD;  [facility];  Service: Orthopedics;  Laterality: Left;  . SPINE SURGERY  2015   Dr Hal Neer Fusion   . SPINE SURGERY  1999   discectomy  . TOTAL KNEE ARTHROPLASTY Right 02/02/2015   Procedure:  RIGHT TOTAL KNEE ARTHROPLASTY;  Surgeon: Carole Civil, MD;  [facility];  Service: Orthopedics;  Laterality: Right;  LM with pt's daughter of new arrival time (10:45)    . TOTAL KNEE ARTHROPLASTY Left  07/24/2016   Procedure: TOTAL KNEE ARTHROPLASTY;  Surgeon: Carole Civil, MD;  [facility];  Service: Orthopedics;  Laterality: Left;  . TOTAL SHOULDER ARTHROPLASTY Left 11/08/2020   Procedure: TOTAL SHOULDER ARTHROPLASTY;  Surgeon: Mordecai Rasmussen, MD;  [facility];  Service: Orthopedics;  Laterality: Left;  Left shoulder Reverse Arthroplasty    There were no vitals filed for this visit.   Subjective Assessment - 01/22/21 0820    Subjective COVID-19 screen performed prior to patient entering clinic. Reports that he did pretty good while on a little vacation over the weekend once he found a comfortable position for sleeping.    Pertinent History Left reverse total shoulder replacement 11/08/2020, HTN, DM, history of bilateral TKA, history of spinal surgery    Limitations Lifting;House hold activities    Patient Stated Goals move left arm better    Currently in Pain? Yes    Pain Score 3     Pain Location Shoulder    Pain Orientation Left    Pain Descriptors / Indicators Aching    Pain Type Surgical pain    Pain Onset More than a month ago    Pain Frequency Intermittent              [facility] PT Assessment - 01/22/21 0001      Assessment   Medical Diagnosis Chronic left shoulder pain; S/P L reverse total shoulder arthroplasty    Referring Provider (PT) Larena Glassman, MD    Onset Date/Surgical Date 11/08/20    Hand Dominance Right    Next MD Visit 01/2021    Prior Therapy yes      Precautions   Precautions Shoulder    Type of Shoulder Precautions See Dr. Amedeo Kinsman' reverse TSA protocol      Observation/Other Assessments   Focus on Therapeutic Outcomes (FOTO)  46% limitation 10th visit 01/22/2021                         [facility] Adult PT Treatment/Exercise - 01/22/21 0001      Shoulder Exercises: Supine   Protraction AAROM;Both;20 reps    External Rotation AAROM;Left;20 reps    Flexion AAROM;Both;20 reps      Shoulder Exercises: Pulleys   Flexion 5 minutes     Other Pulley Exercises Wall ladder at level 25 x5 reps      Modalities   Modalities Vasopneumatic      Vasopneumatic   Number Minutes Vasopneumatic  10 minutes    Vasopnuematic Location  Shoulder    Vasopneumatic Pressure Low    Vasopneumatic Temperature  34 for pain and edema      Manual Therapy   Manual Therapy Passive ROM    Passive ROM PROM of R shoulder into flexion, ER with holds at end range  PT Short Term Goals - 11/19/20 0928      PT SHORT TERM GOAL #1   Title STG's=LTG's             PT Long Term Goals - 01/07/21 0819      PT LONG TERM GOAL #1   Title Patient will be independent with HEP and its progression.    Time 10    Period Weeks    Status On-going      PT LONG TERM GOAL #2   Title Patient will demonstrate 140+ degrees of left shoulder flexion AROM to improve overhead activities.    Time 10    Period Weeks    Status On-going      PT LONG TERM GOAL #3   Title Patient will demonstrate 55+ degrees of left shoulder ER AROM to improve donning/doffing apparel.    Baseline AROM 45 degrees 01/07/21    Time 10    Period Weeks    Status On-going      PT LONG TERM GOAL #4   Title Patient will report ability to perform ADLs with left shoulder pain less than or equal to 4/10.    Time 10    Period Weeks    Status On-going      PT LONG TERM GOAL #5   Title Patient will demonstrate 4+/5  left shoulder MMT to improve stability during functional tasks.    Time 10    Period Weeks    Status On-going                 Plan - 01/22/21 0857    Clinical Impression Statement Patient presented in clinic with reports of low level shoulder ache. Patient able to tolerate the AAROM exercise fairly well although still weak with functional activities and fatigues easily. Firm end feels and smooth arc of motion noted during PROM of L shoulder. Normal vasopneumatic response noted following removal of the modality.    Personal Factors and  Comorbidities Comorbidity 3+;Time since onset of injury/illness/exacerbation;Age    Comorbidities Left reverse total shoulder replacement 11/08/2020, HTN, DM, history of bilateral TKA, history of spinal surgery    Examination-Activity Limitations Sleep;Caring for Others;Reach Overhead;Dressing;Hygiene/Grooming;Bed Mobility    Stability/Clinical Decision Making Stable/Uncomplicated    Rehab Potential Good    PT Frequency 2x / week    PT Duration 8 weeks    PT Treatment/Interventions ADLs/Self Care Home Management;Cryotherapy;Electrical Stimulation;Moist Heat;Functional mobility training;Therapeutic activities;Therapeutic exercise;Neuromuscular re-education;Manual techniques;Passive range of motion;Patient/family education;Vasopneumatic Device;Taping    PT Next Visit Plan Progress into AAROM/AROM with light strengthening per protocol avoid IR and ext for 12 weeks; modalities PRN for pain relief; NO IONTOPHORESIS PER AETNA    PT Home Exercise Plan continue HEP provided by MD    Consulted and Agree with Plan of Care Patient           Patient will benefit from skilled therapeutic intervention in order to improve the following deficits and impairments:  Decreased activity tolerance,Decreased strength,Increased edema,Postural dysfunction,Pain,Decreased range of motion,Impaired UE functional use  Visit Diagnosis: Stiffness of left shoulder, not elsewhere classified  Acute pain of left shoulder  Muscle weakness (generalized)     Problem List Patient Active Problem List   Diagnosis Date Noted  . Rotator cuff arthropathy of left shoulder 11/08/2020  . Abnormal feces 08/30/2020  . Chronic idiopathic constipation 08/30/2020  . Colon cancer screening 08/30/2020  . Family history of malignant neoplasm of gastrointestinal tract 08/30/2020  . Personal history of colonic polyps  08/30/2020  . Chest pain 11/28/2019  . Symptomatic bradycardia 11/28/2019  . Nontraumatic complete tear of left rotator  cuff   . Hypertension 08/31/2019  . S/P left rotator cuff repair/ biceps tenodesis 02/15/19  03/01/2019  . Labral tear of long head of left biceps tendon   . Partial tear of left subscapularis tendon   . Traumatic complete tear of left rotator cuff   . Family history of malignant neoplasm of prostate 07/28/2018  . History of Graves' disease 11/11/2017  . S/P total knee replacement, right 02/02/15 08/12/2017  . S/P total knee replacement, left 07/24/16 08/12/2017  . Hypercholesterolemia 11/08/2015  . History of DVT (deep vein thrombosis) 11/04/2015  . History of cocaine abuse ([place]) 11/04/2015  . History of heroin abuse ([place]) 11/04/2015  . Obesity 11/04/2015  . Parasomnia 11/04/2015  . Benign paroxysmal positional vertigo 10/29/2015  . Controlled type 2 diabetes mellitus without complication ([place]) 50/53/9767  . Elevated prostate specific antigen (PSA) 10/29/2015  . HX: long term anticoagulant use 10/29/2015  . Sensorineural hearing loss 10/29/2015  . Sleep apnea with use of continuous positive airway pressure (CPAP) 10/29/2015  . Chronic radicular lumbar pain 10/29/2015  . Arthrofibrosis of total knee arthroplasty ([place])   . Pulmonary embolus ([place]) 01/09/2015  . History of pulmonary embolism 01/09/2015  . UTI (lower urinary tract infection) 02/21/2014  . Other headache syndrome 02/21/2014  . Nausea with vomiting 02/21/2014  . Rheumatoid arthritis ([place]) 02/21/2014  . Nausea & vomiting 02/21/2014  . Headache(784.0) 02/21/2014  . Lumbar spinal stenosis 02/10/2014    Standley Brooking, PTA 01/22/2021, 9:03 AM  [facility] [place], [place], [address] Phone: [phone]   Fax:  [phone]  Name: Willie Howe MRN: [id number] Date of Birth: 1961/06/17  Progress Note Reporting Period 11/19/20 to 01/22/21.  See note below for Objective Data and Assessment of Progress/Goals. Very good progression per protocol.    Mali Applegate  MPT

## 2021-01-24 ENCOUNTER — Other Ambulatory Visit: Payer: Self-pay

## 2021-01-25 MED ORDER — HYDROCODONE-ACETAMINOPHEN 5-325 MG PO TABS: 1.0000 | ORAL_TABLET | Freq: Three times a day (TID) | ORAL | 0 refills | Status: DC | PRN

## 2021-01-28 ENCOUNTER — Other Ambulatory Visit: Payer: Self-pay

## 2021-01-28 ENCOUNTER — Ambulatory Visit: Payer: Medicare HMO | Admitting: Physical Therapy

## 2021-01-28 DIAGNOSIS — M6281 Muscle weakness (generalized): Secondary | ICD-10-CM | POA: Diagnosis not present

## 2021-01-28 DIAGNOSIS — G8929 Other chronic pain: Secondary | ICD-10-CM

## 2021-01-28 DIAGNOSIS — M25612 Stiffness of left shoulder, not elsewhere classified: Secondary | ICD-10-CM | POA: Diagnosis not present

## 2021-01-28 DIAGNOSIS — M25512 Pain in left shoulder: Secondary | ICD-10-CM

## 2021-01-28 NOTE — Therapy (Signed)
[facility] [place], [place], [address] Phone: [phone]   Fax:  [phone]  Physical Therapy Treatment  Patient Details  Name: Willie Howe MRN: [id number] Date of Birth: September 16, 1960 Referring Provider (PT): Larena Glassman, MD   Encounter Date: 01/28/2021   PT End of Session - 01/28/21 0916     Visit Number 11    Number of Visits 16    Date for PT Re-Evaluation 02/11/21    Authorization Type [employer] (CQ and KX modifier); FOTO initial 96% limitation; progress note every 10th visit    PT Start Time 0815    PT Stop Time 0904    PT Time Calculation (min) 49 min             Past Medical History:  Diagnosis Date   BPPV (benign paroxysmal positional vertigo)    DVT (deep venous thrombosis) (HCC)    Graves disease 1996   History of kidney stones    History of pulmonary embolism    Hypertension    Obesity    Rheumatoid arthritis ([place])    Sleep apnea    Intolerant of CPAP   Type 2 diabetes mellitus ([place])     Past Surgical History:  Procedure Laterality Date   BACK SURGERY  2015   COLONOSCOPY     EXAM UNDER ANESTHESIA WITH MANIPULATION OF KNEE Right 04/18/2015   Procedure: MANIPULATION OF RIGHT KNEE UNDER ANESTHESIA;  Surgeon: Carole Civil, MD;  Location: [facility];  Service: Orthopedics;  Laterality: Right;   IVC Filter     removed December 2016   KIDNEY STONE SURGERY  2010   KNEE ARTHROSCOPY Right 2006   meniscus    KNEE ARTHROSCOPY WITH MEDIAL MENISECTOMY Left 04/24/2016   Procedure: LEFT KNEE ARTHROSCOPY WITH PARTIAL MEDIAL MENISECTOMY;  Surgeon: Carole Civil, MD;  Location: [facility];  Service: Orthopedics;  Laterality: Left;   KNEE SURGERY     x 2   KNEE SURGERY Left 1976   ?ligament repair    LEFT HEART CATH AND CORONARY ANGIOGRAPHY N/A 11/29/2019   Procedure: LEFT HEART CATH AND CORONARY ANGIOGRAPHY;  Surgeon: Martinique, Peter M, MD;  Location: [facility];  Service: Cardiovascular;   Laterality: N/A;   LUMBAR WOUND DEBRIDEMENT N/A 02/23/2014   Procedure: LUMBAR WOUND DEBRIDEMENT;  Surgeon: Faythe Ghee, MD;  Location: [facility];  Service: Neurosurgery;  Laterality: N/A;  exploration lumbar wound. repair of dural defect.   MENISECTOMY Right    open medial    NEPHROLITHOTOMY     PERIPHERAL VASCULAR CATHETERIZATION N/A 01/30/2015   Procedure: IVC Filter Insertion;  Surgeon: Serafina Mitchell, MD;  Location: [facility];  Service: Cardiovascular;  Laterality: N/A;   PERIPHERAL VASCULAR CATHETERIZATION N/A 04/24/2015   Procedure: IVC Filter Removal;  Surgeon: Serafina Mitchell, MD;  Location: [facility];  Service: Cardiovascular;  Laterality: N/A;   PERIPHERAL VASCULAR CATHETERIZATION N/A 08/14/2015   Procedure: IVC Filter Removal;  Surgeon: Serafina Mitchell, MD;  Location: [facility];  Service: Cardiovascular;  Laterality: N/A;   SHOULDER ARTHROSCOPY WITH OPEN ROTATOR CUFF REPAIR Left 11/10/2019   Procedure: SHOULDER ARTHROSCOPY WITH OPEN ROTATOR CUFF REPAIR;  Surgeon: Carole Civil, MD;  Location: [facility];  Service: Orthopedics;  Laterality: Left;  pt tested + on 2/23, does not need Covid test < 90 days   SHOULDER ARTHROSCOPY WITH ROTATOR CUFF REPAIR AND OPEN BICEPS TENODESIS Left 02/15/2019   Procedure:  SHOULDER ARTHROSCOPY WITH open ROTATOR CUFF REPAIR AND OPEN BICEPS TENODESIS;  Surgeon: Carole Civil, MD;  Location: [facility];  Service: Orthopedics;  Laterality: Left;   SPINE SURGERY  2015   Dr Hal Neer Fusion    SPINE SURGERY  1999   discectomy   TOTAL KNEE ARTHROPLASTY Right 02/02/2015   Procedure:  RIGHT TOTAL KNEE ARTHROPLASTY;  Surgeon: Carole Civil, MD;  Location: [facility];  Service: Orthopedics;  Laterality: Right;  LM with pt's daughter of new arrival time (10:45)     TOTAL KNEE ARTHROPLASTY Left 07/24/2016   Procedure: TOTAL KNEE ARTHROPLASTY;  Surgeon: Carole Civil, MD;  Location: [facility];  Service: Orthopedics;  Laterality: Left;    TOTAL SHOULDER ARTHROPLASTY Left 11/08/2020   Procedure: TOTAL SHOULDER ARTHROPLASTY;  Surgeon: Mordecai Rasmussen, MD;  Location: [facility];  Service: Orthopedics;  Laterality: Left;  Left shoulder Reverse Arthroplasty    There were no vitals filed for this visit.   Subjective Assessment - 01/28/21 0818     Subjective COVID-19 screen performed prior to patient entering clinic. Patient reported soreness from doing too much with shoulder over weekend    Pertinent History Left reverse total shoulder replacement 11/08/2020, HTN, DM, history of bilateral TKA, history of spinal surgery    Limitations Lifting;House hold activities    Patient Stated Goals move left arm better    Currently in Pain? Yes    Pain Score 3     Pain Location Shoulder    Pain Orientation Left    Pain Descriptors / Indicators Aching;Discomfort    Pain Type Surgical pain    Pain Onset More than a month ago    Pain Frequency Intermittent    Aggravating Factors  use of shoulder    Pain Relieving Factors at rest                [facility] PT Assessment - 01/28/21 0001       ROM / Strength   AROM / PROM / Strength AROM;PROM      AROM   AROM Assessment Site Shoulder    Right/Left Shoulder Left    Left Shoulder Flexion 85 Degrees    Left Shoulder External Rotation 40 Degrees      PROM   PROM Assessment Site Shoulder    Right/Left Shoulder Left    Left Shoulder Flexion 130 Degrees    Left Shoulder External Rotation 56 Degrees                           [facility] Adult PT Treatment/Exercise - 01/28/21 0001       Shoulder Exercises: Supine   Other Supine Exercises Supine cane for ER and flexion x10 each to review technique      Shoulder Exercises: Pulleys   Flexion 5 minutes    Other Pulley Exercises Wall ladder at level 25 x5 reps      Shoulder Exercises: Isometric Strengthening   Flexion 5X10"    Extension 5X10"    External Rotation 5X10"    Internal Rotation 5X10"      Vasopneumatic   Number  Minutes Vasopneumatic  15 minutes    Vasopnuematic Location  Shoulder    Vasopneumatic Pressure Low    Vasopneumatic Temperature  34 for pain and edema      Manual Therapy   Manual Therapy Passive ROM    Manual therapy comments rhythmic stabs for IR/ER in scaption and flex/ext at 90 degrees  in supine    Passive ROM PROM of R shoulder into flexion, ER with holds at end range                    PT Education - 01/28/21 0856     Education Details re print for isometrics    Person(s) Educated Patient    Methods Explanation;Demonstration;Handout    Comprehension Verbalized understanding;Returned demonstration              PT Short Term Goals - 11/19/20 0928       PT SHORT TERM GOAL #1   Title STG's=LTG's               PT Long Term Goals - 01/28/21 0856       PT LONG TERM GOAL #1   Title Patient will be independent with HEP and its progression.    Baseline issued isometrics 01/28/21    Time 10    Period Weeks    Status On-going      PT LONG TERM GOAL #2   Title Patient will demonstrate 140+ degrees of left shoulder flexion AROM to improve overhead activities.    Baseline AROM 85 /PROM 130 01/28/21    Time 10    Period Weeks    Status On-going      PT LONG TERM GOAL #3   Title Patient will demonstrate 55+ degrees of left shoulder ER AROM to improve donning/doffing apparel.    Baseline AROM 40/ PROM 56 01/28/21    Time 10    Period Weeks    Status On-going      PT LONG TERM GOAL #4   Title Patient will report ability to perform ADLs with left shoulder pain less than or equal to 4/10.    Time 10    Period Weeks    Status On-going      PT LONG TERM GOAL #5   Title Patient will demonstrate 4+/5  left shoulder MMT to improve stability during functional tasks.    Time 10    Period Weeks    Status On-going                   Plan - 01/28/21 6720     Clinical Impression Statement Patient tolerated treatment well today although arrived with  soreness and tighntness in left shoulder. Today focused on ROM to improve mobility, reviewed cane exercises and re print for isometrics for home progresson. Today measurements active were less due to pain and tightness. Patient going to MD for F/U this week. To progress next week. Goals ongoing this week.    Personal Factors and Comorbidities Comorbidity 3+;Time since onset of injury/illness/exacerbation;Age    Comorbidities Left reverse total shoulder replacement 11/08/2020, HTN, DM, history of bilateral TKA, history of spinal surgery    Examination-Activity Limitations Sleep;Caring for Others;Reach Overhead;Dressing;Hygiene/Grooming;Bed Mobility             Patient will benefit from skilled therapeutic intervention in order to improve the following deficits and impairments:  Decreased activity tolerance, Decreased strength, Increased edema, Postural dysfunction, Pain, Decreased range of motion, Impaired UE functional use  Visit Diagnosis: Stiffness of left shoulder, not elsewhere classified  Acute pain of left shoulder  Muscle weakness (generalized)  Chronic left shoulder pain     Problem List Patient Active Problem List   Diagnosis Date Noted   Rotator cuff arthropathy of left shoulder 11/08/2020   Abnormal feces 08/30/2020   Chronic idiopathic constipation 08/30/2020  Colon cancer screening 08/30/2020   Family history of malignant neoplasm of gastrointestinal tract 08/30/2020   Personal history of colonic polyps 08/30/2020   Chest pain 11/28/2019   Symptomatic bradycardia 11/28/2019   Nontraumatic complete tear of left rotator cuff    Hypertension 08/31/2019   S/P left rotator cuff repair/ biceps tenodesis 02/15/19  03/01/2019   Labral tear of long head of left biceps tendon    Partial tear of left subscapularis tendon    Traumatic complete tear of left rotator cuff    Family history of malignant neoplasm of prostate 07/28/2018   History of Graves' disease 11/11/2017    S/P total knee replacement, right 02/02/15 08/12/2017   S/P total knee replacement, left 07/24/16 08/12/2017   Hypercholesterolemia 11/08/2015   History of DVT (deep vein thrombosis) 11/04/2015   History of cocaine abuse ([place]) 11/04/2015   History of heroin abuse ([place]) 11/04/2015   Obesity 11/04/2015   Parasomnia 11/04/2015   Benign paroxysmal positional vertigo 10/29/2015   Controlled type 2 diabetes mellitus without complication ([place]) 26/37/8588   Elevated prostate specific antigen (PSA) 10/29/2015   HX: long term anticoagulant use 10/29/2015   Sensorineural hearing loss 10/29/2015   Sleep apnea with use of continuous positive airway pressure (CPAP) 10/29/2015   Chronic radicular lumbar pain 10/29/2015   Arthrofibrosis of total knee arthroplasty ([place])    Pulmonary embolus ([place]) 01/09/2015   History of pulmonary embolism 01/09/2015   UTI (lower urinary tract infection) 02/21/2014   Other headache syndrome 02/21/2014   Nausea with vomiting 02/21/2014   Rheumatoid arthritis ([place]) 02/21/2014   Nausea & vomiting 02/21/2014   Headache(784.0) 02/21/2014   Lumbar spinal stenosis 02/10/2014    Ladean Raya, PTA 01/28/21 9:19 AM   [facility] [address] [place], [place], [address] Phone: [phone]   Fax:  [phone]  Name: ETHANAEL VEITH MRN: [id number] Date of Birth: [birth year]

## 2021-01-28 NOTE — Patient Instructions (Signed)
Strengthening: Isometric Flexion   Using wall for resistance, press fist into ball using light pressure. Hold __5__ seconds. Repeat __10__ times per set. Do __2__ sets per session. Do _2___ sessions per day.   Strengthening: Isometric Extension   Using wall for resistance, press back of left arm into ball using light pressure. Hold _5___ seconds. Repeat __10__ times per set. Do __2__ sets per session. Do __2__ sessions per day.   Strengthening: Isometric External Rotation   Using wall to provide resistance, and keeping arm at side, press back of hand into ball using light pressure. Hold __5__ seconds. Repeat __10__ times per set. Do _2___ sets per session. Do __2__ sessions per day.   Strengthening: Isometric Internal Rotation   Using door frame for resistance, press palm of hand into ball using light pressure. Keep elbow in at side. Hold _5___ seconds. Repeat __10__ times per set. Do _2___ sets per session. Do __2__ sessions per day.

## 2021-01-30 ENCOUNTER — Ambulatory Visit: Payer: Medicare HMO

## 2021-01-30 ENCOUNTER — Other Ambulatory Visit: Payer: Self-pay

## 2021-01-30 ENCOUNTER — Ambulatory Visit (INDEPENDENT_AMBULATORY_CARE_PROVIDER_SITE_OTHER): Payer: Medicare HMO | Admitting: Orthopedic Surgery

## 2021-01-30 ENCOUNTER — Encounter: Payer: Self-pay | Admitting: Orthopedic Surgery

## 2021-01-30 DIAGNOSIS — Z96612 Presence of left artificial shoulder joint: Secondary | ICD-10-CM | POA: Diagnosis not present

## 2021-01-30 DIAGNOSIS — M12812 Other specific arthropathies, not elsewhere classified, left shoulder: Secondary | ICD-10-CM

## 2021-01-30 NOTE — Progress Notes (Signed)
Orthopaedic Postop Note  Assessment: Willie Howe is a 60 y.o. male s/p Left Reverse Shoulder Arthroplasty  DOS: 11/08/2020  Plan: [name] has shown improvement since the last visit.  However, he does continue to exhibit some stiffness of his left shoulder.  He continues work with physical therapy, and is doing exercises on his own.  He has been pushing himself more.  More concerning at this point, as his continued reliance on narcotic pain medications.  We discussed this in great detail.  I will continue to reduce the duration and frequency of his dosing with a goal of no more hydrocodone in approximately 4 weeks.  He stated his understanding.  If he continues to require pain medications beyond that, we will have to refer him to pain management.  He stated his understanding once again.  Follow-up in 3 months.  Follow-up: Return in about 3 months (around 05/02/2021).  XR at next visit: Left shoulder  Subjective:  Chief Complaint  Patient presents with   Post-op Follow-up    11/08/20 left shoulder reverse     History of Present Illness: Willie Howe is a 60 y.o. male who presents following the above stated procedure.  Surgery was approximately 3 months ago.  He continues to work with physical therapy.  He states he has noticed some improvement in his pain.  He notes that he is also using his left arm for more activities.  He is thinking less about pain in his left shoulder.  He does continue to take hydrocodone at least a couple of times daily.  He is also taking ibuprofen, but limits his use of this medication due to a history of bleeding ulcer.   Review of Systems: No fevers or chills No numbness or tingling No Chest Pain No shortness of breath   Objective: There were no vitals taken for this visit.  Physical Exam:  Alert and oriented, no acute distress.  Anterior shoulder incision is healing well.  No surrounding erythema.  No drainage.  He does have some scar tissue  buildup, with little bit of tenderness to deep palpation.  Active forward flexion to 85 degrees.  Abduction at his side to 80 degrees.  Passively, I am able to take him a little bit further, but this is limited due to pain.  Internal rotation to his lumbar spine, which is improved.  Continues to have limited external rotation.  Sensation is intact distally.  Sensation intact in the axillary patch.  IMAGING: I personally ordered and reviewed the following images:  XR of the Left obtained in clinic today and demonstrates shoulder arthroplasty with implants in good position.  No subsidence or failure of the implants.  No acute injuries.  Impression: Left shoulder reverse arthroplasty in good position   Mordecai Rasmussen, MD 01/30/2021 11:40 AM

## 2021-01-31 ENCOUNTER — Other Ambulatory Visit: Payer: Self-pay

## 2021-01-31 MED ORDER — HYDROCODONE-ACETAMINOPHEN 5-325 MG PO TABS: 1.0000 | ORAL_TABLET | Freq: Two times a day (BID) | ORAL | 0 refills | Status: DC | PRN

## 2021-02-04 ENCOUNTER — Ambulatory Visit: Payer: Medicare HMO | Admitting: Physical Therapy

## 2021-02-04 ENCOUNTER — Encounter: Payer: Self-pay | Admitting: Physical Therapy

## 2021-02-04 ENCOUNTER — Other Ambulatory Visit: Payer: Self-pay

## 2021-02-04 DIAGNOSIS — M25512 Pain in left shoulder: Secondary | ICD-10-CM | POA: Diagnosis not present

## 2021-02-04 DIAGNOSIS — G8929 Other chronic pain: Secondary | ICD-10-CM | POA: Diagnosis not present

## 2021-02-04 DIAGNOSIS — M25612 Stiffness of left shoulder, not elsewhere classified: Secondary | ICD-10-CM | POA: Diagnosis not present

## 2021-02-04 DIAGNOSIS — M6281 Muscle weakness (generalized): Secondary | ICD-10-CM | POA: Diagnosis not present

## 2021-02-04 NOTE — Therapy (Signed)
[facility] [place], [place], [address] Phone: [phone]   Fax:  [phone]  Physical Therapy Treatment  Patient Details  Name: Willie Howe MRN: [id number] Date of Birth: 03/14/1961 Referring Provider (PT): Larena Glassman, MD   Encounter Date: 02/04/2021   PT End of Session - 02/04/21 0827     Visit Number 12    Number of Visits 16    Date for PT Re-Evaluation 02/11/21    Authorization Type [employer] (CQ and KX modifier); FOTO initial 96% limitation; progress note every 10th visit    PT Start Time 0816    PT Stop Time 0858    PT Time Calculation (min) 42 min    Activity Tolerance Patient tolerated treatment well    Behavior During Therapy WFL for tasks assessed/performed             Past Medical History:  Diagnosis Date   BPPV (benign paroxysmal positional vertigo)    DVT (deep venous thrombosis) (HCC)    Graves disease 1996   History of kidney stones    History of pulmonary embolism    Hypertension    Obesity    Rheumatoid arthritis ([place])    Sleep apnea    Intolerant of CPAP   Type 2 diabetes mellitus ([place])     Past Surgical History:  Procedure Laterality Date   BACK SURGERY  2015   COLONOSCOPY     EXAM UNDER ANESTHESIA WITH MANIPULATION OF KNEE Right 04/18/2015   Procedure: MANIPULATION OF RIGHT KNEE UNDER ANESTHESIA;  Surgeon: Carole Civil, MD;  Location: [facility];  Service: Orthopedics;  Laterality: Right;   IVC Filter     removed December 2016   KIDNEY STONE SURGERY  2010   KNEE ARTHROSCOPY Right 2006   meniscus    KNEE ARTHROSCOPY WITH MEDIAL MENISECTOMY Left 04/24/2016   Procedure: LEFT KNEE ARTHROSCOPY WITH PARTIAL MEDIAL MENISECTOMY;  Surgeon: Carole Civil, MD;  Location: [facility];  Service: Orthopedics;  Laterality: Left;   KNEE SURGERY     x 2   KNEE SURGERY Left 1976   ?ligament repair    LEFT HEART CATH AND CORONARY ANGIOGRAPHY N/A 11/29/2019   Procedure: LEFT HEART CATH  AND CORONARY ANGIOGRAPHY;  Surgeon: Martinique, Peter M, MD;  Location: [facility];  Service: Cardiovascular;  Laterality: N/A;   LUMBAR WOUND DEBRIDEMENT N/A 02/23/2014   Procedure: LUMBAR WOUND DEBRIDEMENT;  Surgeon: Faythe Ghee, MD;  Location: [facility];  Service: Neurosurgery;  Laterality: N/A;  exploration lumbar wound. repair of dural defect.   MENISECTOMY Right    open medial    NEPHROLITHOTOMY     PERIPHERAL VASCULAR CATHETERIZATION N/A 01/30/2015   Procedure: IVC Filter Insertion;  Surgeon: Serafina Mitchell, MD;  Location: [facility];  Service: Cardiovascular;  Laterality: N/A;   PERIPHERAL VASCULAR CATHETERIZATION N/A 04/24/2015   Procedure: IVC Filter Removal;  Surgeon: Serafina Mitchell, MD;  Location: [facility];  Service: Cardiovascular;  Laterality: N/A;   PERIPHERAL VASCULAR CATHETERIZATION N/A 08/14/2015   Procedure: IVC Filter Removal;  Surgeon: Serafina Mitchell, MD;  Location: [place] [facility];  Service: Cardiovascular;  Laterality: N/A;   SHOULDER ARTHROSCOPY WITH OPEN ROTATOR CUFF REPAIR Left 11/10/2019   Procedure: SHOULDER ARTHROSCOPY WITH OPEN ROTATOR CUFF REPAIR;  Surgeon: Carole Civil, MD;  Location: [facility];  Service: Orthopedics;  Laterality: Left;  pt tested + on 2/23, does not need Covid test <  90 days   SHOULDER ARTHROSCOPY WITH ROTATOR CUFF REPAIR AND OPEN BICEPS TENODESIS Left 02/15/2019   Procedure: SHOULDER ARTHROSCOPY WITH open ROTATOR CUFF REPAIR AND OPEN BICEPS TENODESIS;  Surgeon: Carole Civil, MD;  Location: [facility];  Service: Orthopedics;  Laterality: Left;   SPINE SURGERY  2015   Dr Hal Neer Fusion    SPINE SURGERY  1999   discectomy   TOTAL KNEE ARTHROPLASTY Right 02/02/2015   Procedure:  RIGHT TOTAL KNEE ARTHROPLASTY;  Surgeon: Carole Civil, MD;  Location: [facility];  Service: Orthopedics;  Laterality: Right;  LM with pt's daughter of new arrival time (10:45)     TOTAL KNEE ARTHROPLASTY Left 07/24/2016   Procedure: TOTAL KNEE  ARTHROPLASTY;  Surgeon: Carole Civil, MD;  Location: [facility];  Service: Orthopedics;  Laterality: Left;   TOTAL SHOULDER ARTHROPLASTY Left 11/08/2020   Procedure: TOTAL SHOULDER ARTHROPLASTY;  Surgeon: Mordecai Rasmussen, MD;  Location: [facility];  Service: Orthopedics;  Laterality: Left;  Left shoulder Reverse Arthroplasty    There were no vitals filed for this visit.   Subjective Assessment - 02/04/21 0825     Subjective COVID-19 screen performed prior to patient entering clinic. Patient reported one incident of throbbing of shoulder but otherwise no complaints. Reports discomfort was managable.    Pertinent History Left reverse total shoulder replacement 11/08/2020, HTN, DM, history of bilateral TKA, history of spinal surgery    Limitations Lifting;House hold activities    Patient Stated Goals move left arm better    Currently in Pain? Yes    Pain Score 3     Pain Location Shoulder    Pain Orientation Left    Pain Descriptors / Indicators Discomfort    Pain Type Surgical pain    Pain Onset More than a month ago    Pain Frequency Intermittent                [facility] PT Assessment - 02/04/21 0001       Assessment   Medical Diagnosis Chronic left shoulder pain; S/P L reverse total shoulder arthroplasty    Referring Provider (PT) Larena Glassman, MD    Onset Date/Surgical Date 11/08/20    Hand Dominance Right    Next MD Visit 01/2021    Prior Therapy yes      Precautions   Precautions Shoulder    Type of Shoulder Precautions See Dr. Amedeo Kinsman' reverse TSA protocol                           [facility] Adult PT Treatment/Exercise - 02/04/21 0001       Shoulder Exercises: Supine   Protraction AROM;Left;20 reps    Flexion AROM;Left;10 reps   guided eccentrically by PTA   Other Supine Exercises AROM L bicep curls x20 reps      Shoulder Exercises: Pulleys   Flexion 5 minutes    Other Pulley Exercises Wall ladder at level 25 x5 reps      Shoulder Exercises:  ROM/Strengthening   UBE (Upper Arm Bike) 120 RPM x6 min      Modalities   Modalities Vasopneumatic      Vasopneumatic   Number Minutes Vasopneumatic  10 minutes    Vasopnuematic Location  Shoulder    Vasopneumatic Pressure Low    Vasopneumatic Temperature  34 for pain and edema      Manual Therapy   Manual Therapy Passive ROM    Passive ROM PROM of L  shoulder into IR, ER with holds at end range                      PT Short Term Goals - 11/19/20 0928       PT SHORT TERM GOAL #1   Title STG's=LTG's               PT Long Term Goals - 01/28/21 0856       PT LONG TERM GOAL #1   Title Patient will be independent with HEP and its progression.    Baseline issued isometrics 01/28/21    Time 10    Period Weeks    Status On-going      PT LONG TERM GOAL #2   Title Patient will demonstrate 140+ degrees of left shoulder flexion AROM to improve overhead activities.    Baseline AROM 85 /PROM 130 01/28/21    Time 10    Period Weeks    Status On-going      PT LONG TERM GOAL #3   Title Patient will demonstrate 55+ degrees of left shoulder ER AROM to improve donning/doffing apparel.    Baseline AROM 40/ PROM 56 01/28/21    Time 10    Period Weeks    Status On-going      PT LONG TERM GOAL #4   Title Patient will report ability to perform ADLs with left shoulder pain less than or equal to 4/10.    Time 10    Period Weeks    Status On-going      PT LONG TERM GOAL #5   Title Patient will demonstrate 4+/5  left shoulder MMT to improve stability during functional tasks.    Time 10    Period Weeks    Status On-going                   Plan - 02/04/21 0910     Clinical Impression Statement Patient presented in clinic with only minimal discomfort. Patient progressed to more AROM exercises in supine due to weakness and fatigability of antigravity exercises. Eccentric CGA provided from supine AROM flexion by PTA. Firm end feels and smooth arc of motion noted  during PROM session. Normal vasopneumatic response noted following removal of the modality.    Personal Factors and Comorbidities Comorbidity 3+;Time since onset of injury/illness/exacerbation;Age    Comorbidities Left reverse total shoulder replacement 11/08/2020, HTN, DM, history of bilateral TKA, history of spinal surgery    Examination-Activity Limitations Sleep;Caring for Others;Reach Overhead;Dressing;Hygiene/Grooming;Bed Mobility    Stability/Clinical Decision Making Stable/Uncomplicated    Rehab Potential Good    PT Frequency 2x / week    PT Duration 8 weeks    PT Treatment/Interventions ADLs/Self Care Home Management;Cryotherapy;Electrical Stimulation;Moist Heat;Functional mobility training;Therapeutic activities;Therapeutic exercise;Neuromuscular re-education;Manual techniques;Passive range of motion;Patient/family education;Vasopneumatic Device;Taping    PT Next Visit Plan Progress into AAROM/AROM with light strengthening per protocol avoid IR and ext for 12 weeks; modalities PRN for pain relief; NO IONTOPHORESIS PER AETNA    PT Home Exercise Plan continue HEP provided by MD    Consulted and Agree with Plan of Care Patient             Patient will benefit from skilled therapeutic intervention in order to improve the following deficits and impairments:  Decreased activity tolerance, Decreased strength, Increased edema, Postural dysfunction, Pain, Decreased range of motion, Impaired UE functional use  Visit Diagnosis: Stiffness of left shoulder, not elsewhere classified  Acute pain of left  shoulder  Muscle weakness (generalized)     Problem List Patient Active Problem List   Diagnosis Date Noted   Rotator cuff arthropathy of left shoulder 11/08/2020   Abnormal feces 08/30/2020   Chronic idiopathic constipation 08/30/2020   Colon cancer screening 08/30/2020   Family history of malignant neoplasm of gastrointestinal tract 08/30/2020   Personal history of colonic polyps  08/30/2020   Chest pain 11/28/2019   Symptomatic bradycardia 11/28/2019   Nontraumatic complete tear of left rotator cuff    Hypertension 08/31/2019   S/P left rotator cuff repair/ biceps tenodesis 02/15/19  03/01/2019   Labral tear of long head of left biceps tendon    Partial tear of left subscapularis tendon    Traumatic complete tear of left rotator cuff    Family history of malignant neoplasm of prostate 07/28/2018   History of Graves' disease 11/11/2017   S/P total knee replacement, right 02/02/15 08/12/2017   S/P total knee replacement, left 07/24/16 08/12/2017   Hypercholesterolemia 11/08/2015   History of DVT (deep vein thrombosis) 11/04/2015   History of cocaine abuse ([place]) 11/04/2015   History of heroin abuse ([place]) 11/04/2015   Obesity 11/04/2015   Parasomnia 11/04/2015   Benign paroxysmal positional vertigo 10/29/2015   Controlled type 2 diabetes mellitus without complication ([place]) 07/68/0881   Elevated prostate specific antigen (PSA) 10/29/2015   HX: long term anticoagulant use 10/29/2015   Sensorineural hearing loss 10/29/2015   Sleep apnea with use of continuous positive airway pressure (CPAP) 10/29/2015   Chronic radicular lumbar pain 10/29/2015   Arthrofibrosis of total knee arthroplasty ([place])    Pulmonary embolus ([place]) 01/09/2015   History of pulmonary embolism 01/09/2015   UTI (lower urinary tract infection) 02/21/2014   Other headache syndrome 02/21/2014   Nausea with vomiting 02/21/2014   Rheumatoid arthritis ([place]) 02/21/2014   Nausea & vomiting 02/21/2014   Headache(784.0) 02/21/2014   Lumbar spinal stenosis 02/10/2014    Standley Brooking, PTA 02/04/2021, 9:27 AM  [facility] [place], [place], [address] Phone: [phone]   Fax:  [phone]  Name: Willie Howe MRN: [id number] Date of Birth: 10-22-1960

## 2021-02-06 ENCOUNTER — Other Ambulatory Visit: Payer: Self-pay

## 2021-02-06 DIAGNOSIS — G8918 Other acute postprocedural pain: Secondary | ICD-10-CM

## 2021-02-06 DIAGNOSIS — Z96612 Presence of left artificial shoulder joint: Secondary | ICD-10-CM

## 2021-02-08 MED ORDER — HYDROCODONE-ACETAMINOPHEN 5-325 MG PO TABS: 1.0000 | ORAL_TABLET | Freq: Two times a day (BID) | ORAL | 0 refills | Status: DC | PRN

## 2021-02-08 NOTE — Telephone Encounter (Signed)
Patient is calling again about his pain medicine

## 2021-02-08 NOTE — Telephone Encounter (Signed)
Called pt who states he's in his usual amount of pain and is completely out of pain medication. Pt does agree to going to pain management at this time.

## 2021-02-08 NOTE — Telephone Encounter (Signed)
Patient called and wanted to know about his script he sent in yesterday thru [facility]  He sent it to Dr. Aline Brochure instead of Dr. Amedeo Kinsman.   He states he does not have any pain medication at all.    Please call the patient back.  Thank You

## 2021-02-11 ENCOUNTER — Encounter: Payer: Medicare HMO | Admitting: Physical Therapy

## 2021-02-14 ENCOUNTER — Other Ambulatory Visit: Payer: Self-pay

## 2021-02-14 DIAGNOSIS — M0589 Other rheumatoid arthritis with rheumatoid factor of multiple sites: Secondary | ICD-10-CM | POA: Diagnosis not present

## 2021-02-14 DIAGNOSIS — Z79899 Other long term (current) drug therapy: Secondary | ICD-10-CM | POA: Diagnosis not present

## 2021-02-14 MED ORDER — HYDROCODONE-ACETAMINOPHEN 5-325 MG PO TABS: 1.0000 | ORAL_TABLET | Freq: Two times a day (BID) | ORAL | 0 refills | Status: DC | PRN

## 2021-02-21 ENCOUNTER — Other Ambulatory Visit: Payer: Self-pay

## 2021-02-21 MED ORDER — HYDROCODONE-ACETAMINOPHEN 5-325 MG PO TABS: 1.0000 | ORAL_TABLET | Freq: Two times a day (BID) | ORAL | 0 refills | Status: DC | PRN

## 2021-02-25 DIAGNOSIS — Z7984 Long term (current) use of oral hypoglycemic drugs: Secondary | ICD-10-CM | POA: Diagnosis not present

## 2021-02-25 DIAGNOSIS — H5213 Myopia, bilateral: Secondary | ICD-10-CM | POA: Diagnosis not present

## 2021-02-25 DIAGNOSIS — H52203 Unspecified astigmatism, bilateral: Secondary | ICD-10-CM | POA: Diagnosis not present

## 2021-02-25 DIAGNOSIS — E1136 Type 2 diabetes mellitus with diabetic cataract: Secondary | ICD-10-CM | POA: Diagnosis not present

## 2021-02-25 DIAGNOSIS — H524 Presbyopia: Secondary | ICD-10-CM | POA: Diagnosis not present

## 2021-02-25 DIAGNOSIS — H25813 Combined forms of age-related cataract, bilateral: Secondary | ICD-10-CM | POA: Diagnosis not present

## 2021-02-26 DIAGNOSIS — E119 Type 2 diabetes mellitus without complications: Secondary | ICD-10-CM | POA: Diagnosis not present

## 2021-02-26 DIAGNOSIS — Z6834 Body mass index (BMI) 34.0-34.9, adult: Secondary | ICD-10-CM | POA: Diagnosis not present

## 2021-02-26 DIAGNOSIS — M129 Arthropathy, unspecified: Secondary | ICD-10-CM | POA: Diagnosis not present

## 2021-02-26 DIAGNOSIS — R030 Elevated blood-pressure reading, without diagnosis of hypertension: Secondary | ICD-10-CM | POA: Diagnosis not present

## 2021-02-26 DIAGNOSIS — E559 Vitamin D deficiency, unspecified: Secondary | ICD-10-CM | POA: Diagnosis not present

## 2021-02-26 DIAGNOSIS — Z79899 Other long term (current) drug therapy: Secondary | ICD-10-CM | POA: Diagnosis not present

## 2021-02-26 DIAGNOSIS — M25512 Pain in left shoulder: Secondary | ICD-10-CM | POA: Diagnosis not present

## 2021-02-26 DIAGNOSIS — G894 Chronic pain syndrome: Secondary | ICD-10-CM | POA: Diagnosis not present

## 2021-03-12 DIAGNOSIS — E559 Vitamin D deficiency, unspecified: Secondary | ICD-10-CM | POA: Diagnosis not present

## 2021-03-12 DIAGNOSIS — R030 Elevated blood-pressure reading, without diagnosis of hypertension: Secondary | ICD-10-CM | POA: Diagnosis not present

## 2021-03-12 DIAGNOSIS — E119 Type 2 diabetes mellitus without complications: Secondary | ICD-10-CM | POA: Diagnosis not present

## 2021-03-12 DIAGNOSIS — G894 Chronic pain syndrome: Secondary | ICD-10-CM | POA: Diagnosis not present

## 2021-03-12 DIAGNOSIS — Z6834 Body mass index (BMI) 34.0-34.9, adult: Secondary | ICD-10-CM | POA: Diagnosis not present

## 2021-03-12 DIAGNOSIS — M25512 Pain in left shoulder: Secondary | ICD-10-CM | POA: Diagnosis not present

## 2021-03-22 DIAGNOSIS — Z20822 Contact with and (suspected) exposure to covid-19: Secondary | ICD-10-CM | POA: Diagnosis not present

## 2021-03-28 DIAGNOSIS — M0589 Other rheumatoid arthritis with rheumatoid factor of multiple sites: Secondary | ICD-10-CM | POA: Diagnosis not present

## 2021-03-28 DIAGNOSIS — Z79899 Other long term (current) drug therapy: Secondary | ICD-10-CM | POA: Diagnosis not present

## 2021-04-09 DIAGNOSIS — E119 Type 2 diabetes mellitus without complications: Secondary | ICD-10-CM | POA: Diagnosis not present

## 2021-04-09 DIAGNOSIS — Z79899 Other long term (current) drug therapy: Secondary | ICD-10-CM | POA: Diagnosis not present

## 2021-04-09 DIAGNOSIS — M25512 Pain in left shoulder: Secondary | ICD-10-CM | POA: Diagnosis not present

## 2021-04-09 DIAGNOSIS — Z6834 Body mass index (BMI) 34.0-34.9, adult: Secondary | ICD-10-CM | POA: Diagnosis not present

## 2021-04-09 DIAGNOSIS — R030 Elevated blood-pressure reading, without diagnosis of hypertension: Secondary | ICD-10-CM | POA: Diagnosis not present

## 2021-04-09 DIAGNOSIS — G894 Chronic pain syndrome: Secondary | ICD-10-CM | POA: Diagnosis not present

## 2021-04-09 DIAGNOSIS — E559 Vitamin D deficiency, unspecified: Secondary | ICD-10-CM | POA: Diagnosis not present

## 2021-05-06 DIAGNOSIS — E119 Type 2 diabetes mellitus without complications: Secondary | ICD-10-CM | POA: Diagnosis not present

## 2021-05-06 DIAGNOSIS — Z6834 Body mass index (BMI) 34.0-34.9, adult: Secondary | ICD-10-CM | POA: Diagnosis not present

## 2021-05-06 DIAGNOSIS — E559 Vitamin D deficiency, unspecified: Secondary | ICD-10-CM | POA: Diagnosis not present

## 2021-05-06 DIAGNOSIS — M25512 Pain in left shoulder: Secondary | ICD-10-CM | POA: Diagnosis not present

## 2021-05-06 DIAGNOSIS — G894 Chronic pain syndrome: Secondary | ICD-10-CM | POA: Diagnosis not present

## 2021-05-06 DIAGNOSIS — R030 Elevated blood-pressure reading, without diagnosis of hypertension: Secondary | ICD-10-CM | POA: Diagnosis not present

## 2021-05-06 DIAGNOSIS — Z79899 Other long term (current) drug therapy: Secondary | ICD-10-CM | POA: Diagnosis not present

## 2021-05-07 ENCOUNTER — Other Ambulatory Visit: Payer: Self-pay

## 2021-05-07 ENCOUNTER — Encounter: Payer: Self-pay | Admitting: Orthopedic Surgery

## 2021-05-07 ENCOUNTER — Ambulatory Visit: Payer: Medicare HMO

## 2021-05-07 ENCOUNTER — Ambulatory Visit: Payer: Medicare HMO | Admitting: Orthopedic Surgery

## 2021-05-07 VITALS — BP 170/102 | HR 84 | Ht 75.0 in | Wt 272.0 lb

## 2021-05-07 DIAGNOSIS — Z96612 Presence of left artificial shoulder joint: Secondary | ICD-10-CM

## 2021-05-07 NOTE — Progress Notes (Signed)
Orthopaedic Postop Note  Assessment: Willie Howe is a 60 y.o. male s/p Left Reverse Shoulder Arthroplasty  DOS: 11/08/2020  Plan: [name] has had slow progression since the last visit.  He continues to notice improvements, but also has pain on a consistent basis.  We discussed that rTSA following an attempted rotator cuff tear does not always have the same level of benefit, but can still improve pain and function.  He agrees that he is doing better than before surgery, but still has aching in his shoulder, especially if he really uses his shoulder.  I encouraged him to continue to work as he can see improvements for up to a year (sometimes more) following surgery.  He stated his understanding.  Continue with pain management.  Follow up around 1 year anniversary of surgery.  Follow-up: Return in about 6 months (around 11/04/2021).  XR at next visit: Left shoulder  Subjective:  Chief Complaint  Patient presents with   Routine Post Op    Lt RSA DOS 11/08/20    History of Present Illness: Willie Howe is a 60 y.o. male who presents following the above stated procedure.  Surgery was approximately 6 months ago.  He continues to do exercises for his shoulder.  He notes aching pains, especially after he has been working on his exercises.  He takes Hydrocodone 3x daily, provided by pain management.  He is a little frustrated with ongoing pain and stiffness in his shoulder.  He does note improvement in his motion and function.  He will occasionally reach for something without thinking, which is an improvement.  No numbness or tingling.  No issues with his incision.    Review of Systems: No fevers or chills No numbness or tingling No Chest Pain No shortness of breath   Objective: BP (!) 170/102   Pulse 84   Ht 6\' 3"  (1.905 m)   Wt 272 lb (123.4 kg)   BMI 34.00 kg/m   Physical Exam:  Alert and oriented, no acute distress.  Anterior shoulder incision is healing well.  No  surrounding erythema.  No drainage.  He does have some scar tissue buildup. Active forward flexion to 120 degrees.   Abduction at his side to 90 degrees.   Internal rotation to his lumbar spine 25 degrees of passive external rotation.  Sensation is intact distally.  Sensation intact in the axillary patch.  IMAGING: I personally ordered and reviewed the following images:  XR of the Left obtained in clinic today and compared to previous XR.  Radiographs today demonstrate shoulder arthroplasty with implants in good position.  No subsidence or failure of the implants.  No acute injuries.  No change in overall appearance.    Impression: Left shoulder reverse arthroplasty with implants in stable position.    Mordecai Rasmussen, MD 05/07/2021 8:47 AM

## 2021-05-09 DIAGNOSIS — M0589 Other rheumatoid arthritis with rheumatoid factor of multiple sites: Secondary | ICD-10-CM | POA: Diagnosis not present

## 2021-06-03 DIAGNOSIS — M25512 Pain in left shoulder: Secondary | ICD-10-CM | POA: Diagnosis not present

## 2021-06-03 DIAGNOSIS — E559 Vitamin D deficiency, unspecified: Secondary | ICD-10-CM | POA: Diagnosis not present

## 2021-06-03 DIAGNOSIS — G894 Chronic pain syndrome: Secondary | ICD-10-CM | POA: Diagnosis not present

## 2021-06-03 DIAGNOSIS — R030 Elevated blood-pressure reading, without diagnosis of hypertension: Secondary | ICD-10-CM | POA: Diagnosis not present

## 2021-06-03 DIAGNOSIS — Z6834 Body mass index (BMI) 34.0-34.9, adult: Secondary | ICD-10-CM | POA: Diagnosis not present

## 2021-06-03 DIAGNOSIS — Z79899 Other long term (current) drug therapy: Secondary | ICD-10-CM | POA: Diagnosis not present

## 2021-06-03 DIAGNOSIS — E119 Type 2 diabetes mellitus without complications: Secondary | ICD-10-CM | POA: Diagnosis not present

## 2021-06-13 DIAGNOSIS — M150 Primary generalized (osteo)arthritis: Secondary | ICD-10-CM | POA: Diagnosis not present

## 2021-06-13 DIAGNOSIS — Z79899 Other long term (current) drug therapy: Secondary | ICD-10-CM | POA: Diagnosis not present

## 2021-06-13 DIAGNOSIS — M0589 Other rheumatoid arthritis with rheumatoid factor of multiple sites: Secondary | ICD-10-CM | POA: Diagnosis not present

## 2021-06-13 DIAGNOSIS — E669 Obesity, unspecified: Secondary | ICD-10-CM | POA: Diagnosis not present

## 2021-06-13 DIAGNOSIS — Z6835 Body mass index (BMI) 35.0-35.9, adult: Secondary | ICD-10-CM | POA: Diagnosis not present

## 2021-06-18 DIAGNOSIS — E1165 Type 2 diabetes mellitus with hyperglycemia: Secondary | ICD-10-CM | POA: Diagnosis not present

## 2021-06-18 DIAGNOSIS — Z125 Encounter for screening for malignant neoplasm of prostate: Secondary | ICD-10-CM | POA: Diagnosis not present

## 2021-06-18 DIAGNOSIS — Z114 Encounter for screening for human immunodeficiency virus [HIV]: Secondary | ICD-10-CM | POA: Diagnosis not present

## 2021-06-18 DIAGNOSIS — R69 Illness, unspecified: Secondary | ICD-10-CM | POA: Diagnosis not present

## 2021-06-18 DIAGNOSIS — E559 Vitamin D deficiency, unspecified: Secondary | ICD-10-CM | POA: Diagnosis not present

## 2021-06-18 DIAGNOSIS — Z1339 Encounter for screening examination for other mental health and behavioral disorders: Secondary | ICD-10-CM | POA: Diagnosis not present

## 2021-06-18 DIAGNOSIS — Z113 Encounter for screening for infections with a predominantly sexual mode of transmission: Secondary | ICD-10-CM | POA: Diagnosis not present

## 2021-06-18 DIAGNOSIS — Z711 Person with feared health complaint in whom no diagnosis is made: Secondary | ICD-10-CM | POA: Diagnosis not present

## 2021-06-18 DIAGNOSIS — Z0000 Encounter for general adult medical examination without abnormal findings: Secondary | ICD-10-CM | POA: Diagnosis not present

## 2021-06-18 DIAGNOSIS — Z1159 Encounter for screening for other viral diseases: Secondary | ICD-10-CM | POA: Diagnosis not present

## 2021-06-20 DIAGNOSIS — Z79899 Other long term (current) drug therapy: Secondary | ICD-10-CM | POA: Diagnosis not present

## 2021-06-20 DIAGNOSIS — M0589 Other rheumatoid arthritis with rheumatoid factor of multiple sites: Secondary | ICD-10-CM | POA: Diagnosis not present

## 2021-06-20 NOTE — Progress Notes (Signed)
Cardiology Office Note  Date: 06/21/2021   ID: BELL CAI, DOB 1961-05-21, MRN [id number]  PCP:  Nickola Major, MD  Cardiologist:  Rozann Lesches, MD Electrophysiologist:  None   Chief Complaint: Follow-up precordial pain  History of Present Illness: Willie Howe is a 60 y.o. male with a history of significant DVT, DM type II, OSA, thyroid disease, obesity.  Recent follow-up with [name], PA status post presentation to ED for evaluation of chest pressure with associated dyspnea and dizziness.  Troponins were negative.  Initial EKG showed normal sinus rhythm with bradycardia cardia and right bundle branch block but no acute ischemic changes.  CT was negative for PE and chest ray showed questionable atelectasis versus pneumonia.  Patient had a cardiac catheterization on 11/29/2019 showing normal coronary arteries and normal EF on echo.  TSH was normal.  No AV nodal blocking agents.  Needed a sleep study.  Blood pressure was elevated and amlodipine was added.  When seeing [name], PA on 12/26/2019 patient has stopped losartan and amlodipine 3 days prior because of significant the dizziness.  He complained of weakness.  His blood pressure was 124/60.  He has been back on CPAP for a week and was watching his salt closely.  Drinking two 7 ounce Mountain Dew's and 16 ounces coffee daily.  He is here today for 1 year follow-up he states she has not been feeling himself recently.  Feeling tired and fatigued.  Also complaining of some skipped heartbeats.  He states he was told at a recent doctor's visit his heart was beating irregularly.  States he has been having some increasing shortness of breath recently.  He states he gives out easily.  He states he also has been treated in the past for Graves' disease.  He is not sure whether he has had recent thyroid blood work or not.  His blood pressure has been elevated over the last 3 visits in epic.  Today's blood pressure is 152/80.  He  is currently on losartan 100 mg daily for blood pressure control.  He had previously been on amlodipine but had stopped due to dizziness.  He states he is currently on CPAP therapy reluctantly but states he knows he needs it.  States he recently had some lab work at both his primary care office and [facility] rheumatology.  Previous echocardiogram in April 2021 demonstrated EF of 55%.  LV endocardial border not optimally defined to evaluate wall motion.  Mild LVH, indeterminate diastolic parameters.  Cardiac catheterization 11/29/2019 revealed normal coronary anatomy with normal LVEDP    Past Medical History:  Diagnosis Date   BPPV (benign paroxysmal positional vertigo)    DVT (deep venous thrombosis) ([name])    Graves disease 1996   History of kidney stones    History of pulmonary embolism    Hypertension    Obesity    Rheumatoid arthritis ([name])    Sleep apnea    Intolerant of CPAP   Type 2 diabetes mellitus ([name])     Past Surgical History:  Procedure Laterality Date   BACK SURGERY  2015   COLONOSCOPY     EXAM UNDER ANESTHESIA WITH MANIPULATION OF KNEE Right 04/18/2015   Procedure: MANIPULATION OF RIGHT KNEE UNDER ANESTHESIA;  Surgeon: Carole Civil, MD;  Location: [facility];  Service: Orthopedics;  Laterality: Right;   IVC Filter     removed December 2016   KIDNEY STONE SURGERY  2010   KNEE ARTHROSCOPY Right  2006   meniscus    KNEE ARTHROSCOPY WITH MEDIAL MENISECTOMY Left 04/24/2016   Procedure: LEFT KNEE ARTHROSCOPY WITH PARTIAL MEDIAL MENISECTOMY;  Surgeon: Carole Civil, MD;  Location: [facility];  Service: Orthopedics;  Laterality: Left;   KNEE SURGERY     x 2   KNEE SURGERY Left 1976   ?ligament repair    LEFT HEART CATH AND CORONARY ANGIOGRAPHY N/A 11/29/2019   Procedure: LEFT HEART CATH AND CORONARY ANGIOGRAPHY;  Surgeon: Martinique, Peter M, MD;  Location: [place] [facility];  Service: Cardiovascular;  Laterality: N/A;   LUMBAR WOUND DEBRIDEMENT N/A 02/23/2014   Procedure:  LUMBAR WOUND DEBRIDEMENT;  Surgeon: Faythe Ghee, MD;  Location: [place];  Service: Neurosurgery;  Laterality: N/A;  exploration lumbar wound. repair of dural defect.   MENISECTOMY Right    open medial    NEPHROLITHOTOMY     PERIPHERAL VASCULAR CATHETERIZATION N/A 01/30/2015   Procedure: IVC Filter Insertion;  Surgeon: Serafina Mitchell, MD;  Location: [place] [facility];  Service: Cardiovascular;  Laterality: N/A;   PERIPHERAL VASCULAR CATHETERIZATION N/A 04/24/2015   Procedure: IVC Filter Removal;  Surgeon: Serafina Mitchell, MD;  Location: [place] [facility];  Service: Cardiovascular;  Laterality: N/A;   PERIPHERAL VASCULAR CATHETERIZATION N/A 08/14/2015   Procedure: IVC Filter Removal;  Surgeon: Serafina Mitchell, MD;  Location: [place] [facility];  Service: Cardiovascular;  Laterality: N/A;   SHOULDER ARTHROSCOPY WITH OPEN ROTATOR CUFF REPAIR Left 11/10/2019   Procedure: SHOULDER ARTHROSCOPY WITH OPEN ROTATOR CUFF REPAIR;  Surgeon: Carole Civil, MD;  Location: [facility];  Service: Orthopedics;  Laterality: Left;  pt tested + on 2/23, does not need Covid test < 90 days   SHOULDER ARTHROSCOPY WITH ROTATOR CUFF REPAIR AND OPEN BICEPS TENODESIS Left 02/15/2019   Procedure: SHOULDER ARTHROSCOPY WITH open ROTATOR CUFF REPAIR AND OPEN BICEPS TENODESIS;  Surgeon: Carole Civil, MD;  Location: [facility];  Service: Orthopedics;  Laterality: Left;   SPINE SURGERY  2015   Dr Hal Neer Fusion    SPINE SURGERY  1999   discectomy   TOTAL KNEE ARTHROPLASTY Right 02/02/2015   Procedure:  RIGHT TOTAL KNEE ARTHROPLASTY;  Surgeon: Carole Civil, MD;  Location: [facility];  Service: Orthopedics;  Laterality: Right;  LM with pt's daughter of new arrival time (10:45)     TOTAL KNEE ARTHROPLASTY Left 07/24/2016   Procedure: TOTAL KNEE ARTHROPLASTY;  Surgeon: Carole Civil, MD;  Location: [facility];  Service: Orthopedics;  Laterality: Left;   TOTAL SHOULDER ARTHROPLASTY Left 11/08/2020   Procedure: TOTAL SHOULDER  ARTHROPLASTY;  Surgeon: Mordecai Rasmussen, MD;  Location: [facility];  Service: Orthopedics;  Laterality: Left;  Left shoulder Reverse Arthroplasty    Current Outpatient Medications  Medication Sig Dispense Refill   docusate sodium (COLACE) 100 MG capsule Take 200 mg by mouth every other day. At night     folic acid (FOLVITE) 1 MG tablet Take 1 mg by mouth daily.     HYDROcodone-acetaminophen (NORCO/VICODIN) 5-325 MG tablet Take 1 tablet by mouth every 12 (twelve) hours as needed for severe pain. No more than 2 per day 15 tablet 0   ibuprofen (ADVIL) 800 MG tablet TAKE 1 TABLET (800 MG TOTAL) BY MOUTH EVERY 8 (EIGHT) HOURS AS NEEDED FOR MODERATE PAIN. 90 tablet 5   inFLIXimab-abda (RENFLEXIS IV) Inject 1 Dose into the vein every 6 (six) weeks.     loratadine (CLARITIN) 10 MG tablet Take 10 mg by mouth  daily.     losartan (COZAAR) 100 MG tablet Take 100 mg by mouth daily.     metFORMIN (GLUCOPHAGE) 500 MG tablet Take 500 mg by mouth daily with breakfast.     methotrexate 50 MG/2ML injection Inject 200 mg into the muscle every Friday.      predniSONE (DELTASONE) 5 MG tablet Take 5 mg by mouth daily.     tamsulosin (FLOMAX) 0.4 MG CAPS capsule Take 0.4 mg by mouth at bedtime.      No current facility-administered medications for this visit.   Allergies:  Lisinopril and Statins   Social History: The patient  reports that he quit smoking about 26 years ago. His smoking use included cigarettes. He has a 30.00 pack-year smoking history. He has never used smokeless tobacco. He reports that he does not drink alcohol and does not use drugs.   Family History: The patient's family history includes Breast cancer in his mother; CAD in his sister; Deep vein thrombosis in his mother; Hypertension in his father and mother; Prostate cancer in his father.   ROS:  Please see the history of present illness. Otherwise, complete review of systems is positive for none.  All other systems are reviewed and negative.    Physical Exam: VS:  BP (!) 152/80   Pulse (!) 56   Ht 6\' 3"  (1.905 m)   Wt 273 lb 12.8 oz (124.2 kg)   SpO2 99%   BMI 34.22 kg/m , BMI Body mass index is 34.22 kg/m.  Wt Readings from Last 3 Encounters:  06/21/21 273 lb 12.8 oz (124.2 kg)  05/07/21 272 lb (123.4 kg)  11/08/20 (!) 302 lb 0.5 oz (137 kg)    General: Patient appears comfortable at rest. Neck: Supple, no elevated JVP or carotid bruits, no thyromegaly. Lungs: Clear to auscultation, nonlabored breathing at rest. Cardiac: Regular rate and rhythm, no S3 or significant systolic murmur, no pericardial rub. Extremities: No pitting edema, distal pulses 2+. Skin: Warm and dry. Musculoskeletal: No kyphosis. Neuropsychiatric: Alert and oriented x3, affect grossly appropriate.  ECG:  EKG on 11/27/2019 showed sinus bradycardia rate of 47, right bundle branch block and left anterior fascicular block.   Recent Labwork: 11/06/2020: BUN 14; Creatinine, Ser 0.78; Platelets 251; Potassium 3.7; Sodium 138 11/08/2020: Hemoglobin 12.8     Component Value Date/Time   CHOL  10/10/2009 0420    173        ATP III CLASSIFICATION:  <200     mg/dL   Desirable  200-239  mg/dL   Borderline High  >=240    mg/dL   High          TRIG 155 (H) 10/10/2009 0420   HDL 37 (L) 10/10/2009 0420   CHOLHDL 4.7 10/10/2009 0420   VLDL 31 10/10/2009 0420   LDLCALC (H) 10/10/2009 0420    105        Total Cholesterol/HDL:CHD Risk Coronary Heart Disease Risk Table                     Men   Women  1/2 Average Risk   3.4   3.3  Average Risk       5.0   4.4  2 X Average Risk   9.6   7.1  3 X Average Risk  23.4   11.0        Use the calculated Patient Ratio above and the CHD Risk Table to determine the patient's CHD Risk.  ATP III CLASSIFICATION (LDL):  <100     mg/dL   Optimal  100-129  mg/dL   Near or Above                    Optimal  130-159  mg/dL   Borderline  160-189  mg/dL   High  >190     mg/dL   Very High    Other Studies  Reviewed Today:   Echocardiogram 11/28/2019   1. Images are limited. 2. Left ventricular ejection fraction, by estimation, is 55%. The left ventricle has normal function. Left ventricular endocardial border not optimally defined to evaluate regional wall motion. There is mild left ventricular hypertrophy. Left ventricular diastolic parameters are indeterminate. 3. Right ventricular systolic function is low normal. The right ventricular size is normal. Tricuspid regurgitation signal is inadequate for assessing PA pressure. 4. The mitral valve is grossly normal. No evidence of mitral valve regurgitation. 5. The aortic valve is tricuspid. Aortic valve regurgitation is not visualized. 6. The inferior vena cava is normal in size with greater than 50% respiratory variability, suggesting right atrial pressure of 3 mmHg.    Cath 5/88/3254 LV end diastolic pressure is normal.  1. Normal coronary anatomy 2. Normal LV EDP    Assessment and Plan:  1. Precordial pain   2. Sinus bradycardia   3. Essential hypertension   4. OSA (obstructive sleep apnea)     1. Precordial pain Patient denies any further precordial pain since discharge from hospital on 11/29/2019.  Continue aspirin 81 mg daily.  2. Sinus bradycardia Heart rate today is 56 he denies any dizziness, lightheadedness, presyncope or syncopal episodes.  3. Essential hypertension BP 152/80 today.  Continue losartan 100 mg daily.  Start amlodipine 5 mg daily.  4. OSA (obstructive sleep apnea) Last visit patient had restarted his CPAP therapy.  Today he states he continues CPAP reluctantly.  5.  Shortness of breath. Patient states he has been feeling increasingly short of breath recently over the last year.  Please get a follow-up echocardiogram to reassess LV function, diastolic function and valvular function.  6.  Palpitations. Patient states he had a recent PCP visit he was told his heartbeat was irregular and he can sometimes  feel his heart beating irregularly.  Please place a 14-day ZIO monitor.   7.  Fatigue, activity intolerance Patient states he has been feeling increased fatigue and activity intolerance recently.  He states he had a significant amount of lab work at his PCP and his rheumatologist.  We will request lab work from both.  He is not sure he has had a recent thyroid profile.  If not done on recent lab work we will order.  He states he does have a history of Graves' disease previously treated.  Medication Adjustments/Labs and Tests Ordered: Current medicines are reviewed at length with the patient today.  Concerns regarding medicines are outlined above.   Disposition: Follow-up with Dr. Domenic Polite or APP 2 months  Signed, Levell July, NP 06/21/2021 8:53 AM    [facility], [place], [place] [address] Phone: [phone]; Fax: [phone]

## 2021-06-21 ENCOUNTER — Telehealth: Payer: Self-pay | Admitting: Family Medicine

## 2021-06-21 ENCOUNTER — Encounter: Payer: Self-pay | Admitting: Family Medicine

## 2021-06-21 ENCOUNTER — Other Ambulatory Visit: Payer: Self-pay | Admitting: Family Medicine

## 2021-06-21 ENCOUNTER — Ambulatory Visit (INDEPENDENT_AMBULATORY_CARE_PROVIDER_SITE_OTHER): Payer: Medicare HMO

## 2021-06-21 ENCOUNTER — Encounter: Payer: Self-pay | Admitting: *Deleted

## 2021-06-21 ENCOUNTER — Ambulatory Visit: Payer: Medicare HMO | Admitting: Family Medicine

## 2021-06-21 ENCOUNTER — Other Ambulatory Visit: Payer: Self-pay | Admitting: Orthopedic Surgery

## 2021-06-21 VITALS — BP 152/80 | HR 56 | Ht 75.0 in | Wt 273.8 lb

## 2021-06-21 DIAGNOSIS — R002 Palpitations: Secondary | ICD-10-CM

## 2021-06-21 DIAGNOSIS — I10 Essential (primary) hypertension: Secondary | ICD-10-CM | POA: Diagnosis not present

## 2021-06-21 DIAGNOSIS — R0602 Shortness of breath: Secondary | ICD-10-CM

## 2021-06-21 DIAGNOSIS — R072 Precordial pain: Secondary | ICD-10-CM

## 2021-06-21 DIAGNOSIS — G4733 Obstructive sleep apnea (adult) (pediatric): Secondary | ICD-10-CM | POA: Diagnosis not present

## 2021-06-21 DIAGNOSIS — S46012A Strain of muscle(s) and tendon(s) of the rotator cuff of left shoulder, initial encounter: Secondary | ICD-10-CM

## 2021-06-21 DIAGNOSIS — R001 Bradycardia, unspecified: Secondary | ICD-10-CM | POA: Diagnosis not present

## 2021-06-21 MED ORDER — AMLODIPINE BESYLATE 5 MG PO TABS: 5.0000 mg | ORAL_TABLET | Freq: Every day | ORAL | 1 refills | Status: DC

## 2021-06-21 NOTE — Patient Instructions (Addendum)
Medication Instructions:  Your physician has recommended you make the following change in your medication:  Start amlodipine 5 mg daily Continue other medications the same  Labwork: none  Testing/Procedures: Your physician has requested that you have an echocardiogram. Echocardiography is a painless test that uses sound waves to create images of your heart. It provides your doctor with information about the size and shape of your heart and how well your heart's chambers and valves are working. This procedure takes approximately one hour. There are no restrictions for this procedure. ZIO- Long Term Monitor Instructions   Your physician has requested you wear your ZIO patch monitor 14 days.   This is a single patch monitor.  [employer] supplies one patch monitor per enrollment.  Additional stickers are not available.   Please do not apply patch if you will be having a Nuclear Stress Test, Echocardiogram, Cardiac CT, MRI, or Chest Xray during the time frame you would be wearing the monitor. The patch cannot be worn during these tests.  You cannot remove and re-apply the ZIO XT patch monitor.     Once you have received you monitor, please review enclosed instructions.  Your monitor has already been registered assigning a specific monitor serial # to you.   Applying the monitor   Shave hair from upper left chest.   Hold abrader disc by orange tab.  Rub abrader in 40 strokes over left upper chest as indicated in your monitor instructions.   Clean area with 4 enclosed alcohol pads .  Use all pads to assure are is cleaned thoroughly.  Let dry.   Apply patch as indicated in monitor instructions.  Patch will be place under collarbone on left side of chest with arrow pointing upward.   Rub patch adhesive wings for 2 minutes.Remove white label marked "1".  Remove white label marked "2".  Rub patch adhesive wings for 2 additional minutes.   While looking in a mirror, press and release button in  center of patch.  A small green light will flash 3-4 times .  This will be your only indicator the monitor has been turned on.     Do not shower for the first 24 hours.  You may shower after the first 24 hours.   Press button if you feel a symptom. You will hear a small click.  Record Date, Time and Symptom in the Patient Log Book.   When you are ready to remove patch, follow instructions on last 2 pages of Patient Log Book.  Stick patch monitor onto last page of Patient Log Book.   Place Patient Log Book in [place] box.  Use locking tab on box and tape box closed securely.  The [employer] has [employer] on it.  Please place in mailbox as soon as possible.  Your physician should have your test results approximately 7 days after the monitor has been mailed back to [facility].   Call [facility] at [phone] if you have questions regarding your ZIO XT patch monitor.  Call them immediately if you see an orange light blinking on your monitor.   If your monitor falls off in less than 4 days contact our Monitor department at [phone].  If your monitor becomes loose or falls off after 4 days call [employer] at [phone] for suggestions on securing your monitor.  Follow-Up: Your physician recommends that you schedule a follow-up appointment in: 2 months  Any Other Special Instructions Will Be Listed Below (If  Applicable).  If you need a refill on your cardiac medications before your next appointment, please call your pharmacy.

## 2021-06-21 NOTE — Telephone Encounter (Signed)
PERCERT:  14 day zio GZ35 day zio xt

## 2021-06-24 ENCOUNTER — Telehealth: Payer: Self-pay | Admitting: Family Medicine

## 2021-06-24 NOTE — Telephone Encounter (Signed)
Patient advised to call customer service number on box - they can overnight him some new patches.  He verbalized understanding.

## 2021-06-24 NOTE — Telephone Encounter (Signed)
Patient said his heart monitor that was put on him Friday fell off. He was not able to re-apply it . He is not sure what to do

## 2021-06-24 NOTE — Telephone Encounter (Signed)
PERCERT  14 day zio xt

## 2021-06-27 ENCOUNTER — Telehealth: Payer: Self-pay | Admitting: *Deleted

## 2021-06-27 ENCOUNTER — Other Ambulatory Visit: Payer: Self-pay | Admitting: *Deleted

## 2021-06-27 DIAGNOSIS — R002 Palpitations: Secondary | ICD-10-CM

## 2021-06-27 NOTE — Telephone Encounter (Signed)
-----   Message from Verta Ellen., NP sent at 06/24/2021  1:31 PM EST ----- His recent lab work did not have thyroid studies on them.  Please order a thyroid profile including TSH, T3, T4.  Thank you . Verta Ellen, NP  06/24/2021 1:31 PM

## 2021-06-27 NOTE — Telephone Encounter (Signed)
Patient informed and verbalized understanding of plan. Lab orders faxed to [facility] at [facility]

## 2021-07-04 DIAGNOSIS — E559 Vitamin D deficiency, unspecified: Secondary | ICD-10-CM | POA: Diagnosis not present

## 2021-07-04 DIAGNOSIS — G894 Chronic pain syndrome: Secondary | ICD-10-CM | POA: Diagnosis not present

## 2021-07-04 DIAGNOSIS — M25512 Pain in left shoulder: Secondary | ICD-10-CM | POA: Diagnosis not present

## 2021-07-04 DIAGNOSIS — Z79899 Other long term (current) drug therapy: Secondary | ICD-10-CM | POA: Diagnosis not present

## 2021-07-04 DIAGNOSIS — Z6834 Body mass index (BMI) 34.0-34.9, adult: Secondary | ICD-10-CM | POA: Diagnosis not present

## 2021-07-04 DIAGNOSIS — R030 Elevated blood-pressure reading, without diagnosis of hypertension: Secondary | ICD-10-CM | POA: Diagnosis not present

## 2021-07-04 DIAGNOSIS — E119 Type 2 diabetes mellitus without complications: Secondary | ICD-10-CM | POA: Diagnosis not present

## 2021-07-22 ENCOUNTER — Other Ambulatory Visit (HOSPITAL_COMMUNITY)
Admission: RE | Admit: 2021-07-22 | Discharge: 2021-07-22 | Disposition: A | Discharge status: Home / Self Care | Payer: Medicare HMO | Source: Ambulatory Visit | Attending: Orthopedic Surgery | Admitting: Orthopedic Surgery

## 2021-07-22 ENCOUNTER — Ambulatory Visit: Payer: Medicare HMO | Admitting: Orthopedic Surgery

## 2021-07-22 ENCOUNTER — Encounter: Payer: Self-pay | Admitting: Orthopedic Surgery

## 2021-07-22 ENCOUNTER — Other Ambulatory Visit: Payer: Self-pay

## 2021-07-22 ENCOUNTER — Telehealth: Payer: Self-pay

## 2021-07-22 ENCOUNTER — Ambulatory Visit: Payer: Medicare HMO

## 2021-07-22 DIAGNOSIS — M25512 Pain in left shoulder: Secondary | ICD-10-CM | POA: Insufficient documentation

## 2021-07-22 DIAGNOSIS — G8929 Other chronic pain: Secondary | ICD-10-CM

## 2021-07-22 DIAGNOSIS — Z96612 Presence of left artificial shoulder joint: Secondary | ICD-10-CM

## 2021-07-22 LAB — CBC
HCT: 37.8 % — ABNORMAL LOW (ref 39.0–52.0)
Hemoglobin: 12.1 g/dL — ABNORMAL LOW (ref 13.0–17.0)
MCH: 28.5 pg (ref 26.0–34.0)
MCHC: 32 g/dL (ref 30.0–36.0)
MCV: 89.2 fL (ref 80.0–100.0)
Platelets: 339 10*3/uL (ref 150–400)
RBC: 4.24 MIL/uL (ref 4.22–5.81)
RDW: 17.2 % — ABNORMAL HIGH (ref 11.5–15.5)
WBC: 8.9 10*3/uL (ref 4.0–10.5)
nRBC: 0 % (ref 0.0–0.2)

## 2021-07-22 LAB — C-REACTIVE PROTEIN: CRP: 1 mg/dL — ABNORMAL HIGH (ref <1.0)

## 2021-07-22 LAB — SEDIMENTATION RATE: Sed Rate: 18 mm/hr — ABNORMAL HIGH (ref 0–16)

## 2021-07-22 NOTE — Telephone Encounter (Signed)
Patient's shoulder aspiration scheduled for 07/29/21@ 10am. Patient to be there at 9:30am. Patient aware of time to be there. This date ok with provider.

## 2021-07-22 NOTE — Progress Notes (Signed)
Orthopaedic Postop Note  Assessment: Willie Howe is a 60 y.o. male s/p Left Reverse Shoulder Arthroplasty  DOS: 11/08/2020  Plan: Patient had a left reverse shoulder arthroplasty approximately 8-9 months ago.  He had been progressing slowly, into the last 1-2 weeks.  He noted significant worsening of pain last week, atraumatic.  He is not having any fevers or chills.  However, he notes that he was told he had an elevated white count on some recent blood work.  In addition, there does appear to be some lucency around the proximal aspect of the stem.  Based on all this information, I am concerned that he has developed a left shoulder periprosthetic infection.  Will obtain basic labs, and send him to [facility] for an image guided aspiration.  Aspiration to be sent for cell count and culture.  Once the results are available, we will have him back in clinic to discuss the neck steps.  No limitations on his activities in the meantime.  Follow-up: Return for After labs/test.  XR at next visit: Left shoulder  Subjective:  Chief Complaint  Patient presents with   Shoulder Pain    DOS 11/08/20 S/p reverse arthroplasty    History of Present Illness: Willie Howe is a 60 y.o. male who presents following the above stated procedure.  Surgery was 8-9 months ago.  Overall, he has been progressing slowly.  Approximately 1-2 weeks ago, he noted acute worsening of pain in his left shoulder.  No specific injury.  Since then, his pain has improved some, but his shoulder does not feel normal at this time.  No fevers or chills.  No drainage from the surgical incision.  He states he had some basic lab work done recently, and was told he had an elevated white count.    Review of Systems: No fevers or chills No numbness or tingling No Chest Pain No shortness of breath   Objective: There were no vitals taken for this visit.  Physical Exam:  Alert and oriented, no acute distress.  Anterior  shoulder incision is healing well.  No surrounding erythema or drainage.  Some obvious scar tissue underlying the incision.  Tolerates forward flexion to 100 degrees.  Abduction is side 90 degrees.  Sensation is intact throughout the axillary nerve distribution.  Fingers are warm and well-perfused.  No redness or warmth around the shoulder.  IMAGING: I personally ordered and reviewed the following images:  X-ray of the left shoulder obtained in clinic today, compared to previous x-rays.  Shoulder remains reduced.  There is some proximal lucency at the bone, implant interface.  There has been a change at the interface proximally.  Possible subtle subsidence.  No such issues at the glenoid.  Impression: Left reverse shoulder arthroplasty with some periprosthetic lucencies, at the proximal extent of the humeral implant.   Mordecai Rasmussen, MD 07/22/2021 3:33 PM

## 2021-07-22 NOTE — Patient Instructions (Signed)
We will get some updated labs  Plan to get an aspiration of the left shoulder.  This procedure will be setup and scheduled at the hospital

## 2021-07-25 DIAGNOSIS — N200 Calculus of kidney: Secondary | ICD-10-CM | POA: Diagnosis not present

## 2021-07-25 DIAGNOSIS — N390 Urinary tract infection, site not specified: Secondary | ICD-10-CM | POA: Diagnosis not present

## 2021-07-29 ENCOUNTER — Encounter (HOSPITAL_COMMUNITY): Payer: Self-pay

## 2021-07-29 ENCOUNTER — Ambulatory Visit (HOSPITAL_COMMUNITY): Admission: RE | Admit: 2021-07-29 | Payer: Medicare HMO | Source: Ambulatory Visit

## 2021-07-29 ENCOUNTER — Ambulatory Visit (HOSPITAL_COMMUNITY)
Admission: RE | Admit: 2021-07-29 | Discharge: 2021-07-29 | Disposition: A | Discharge status: Home / Self Care | Payer: Medicare HMO | Source: Ambulatory Visit | Attending: Orthopedic Surgery | Admitting: Orthopedic Surgery

## 2021-07-29 ENCOUNTER — Other Ambulatory Visit: Payer: Self-pay

## 2021-07-29 DIAGNOSIS — Z96612 Presence of left artificial shoulder joint: Secondary | ICD-10-CM | POA: Diagnosis not present

## 2021-07-29 DIAGNOSIS — M25512 Pain in left shoulder: Secondary | ICD-10-CM | POA: Diagnosis not present

## 2021-07-29 DIAGNOSIS — G8929 Other chronic pain: Secondary | ICD-10-CM | POA: Diagnosis not present

## 2021-07-29 MED ORDER — POVIDONE-IODINE 10 % EX SOLN
CUTANEOUS | Status: DC | PRN
  Administered 2021-07-29: 1 via TOPICAL

## 2021-07-29 MED ORDER — LIDOCAINE HCL (PF) 1 % IJ SOLN
INTRAMUSCULAR | Status: AC
  Administered 2021-07-29: 5 mL
  Filled 2021-07-29: qty 5

## 2021-07-29 MED ORDER — SODIUM CHLORIDE (PF) 0.9 % IJ SOLN
INTRAMUSCULAR | Status: AC
  Administered 2021-07-29: 10 mL
  Filled 2021-07-29: qty 10

## 2021-07-29 NOTE — Progress Notes (Signed)
PT tolerated left shoulder fluoro guided aspiration procedure well today and 61mL of left shoulder joint fluid collected and sent to lab for processing. PT verbalized understanding of post procedure instructions and ambulatory at discharge with NAD noted.

## 2021-07-29 NOTE — Procedures (Signed)
Preprocedure Dx: LEFT shoulder pain since reverse arthroplasty Postprocedure Dx: LEFT shoulder pain since reverse arthroplasty Procedure  Fluoroscopically guided LEFT shoulder joint lavage  Radiologist:  Thornton Papas Anesthesia:  3 ml of 1% lidocaine Injectate:  10 ml preservative free sterile saline Aspirate: 5 cc of lavage fluid Fluoro time:  0 minutes 36 seconds EBL:   None Complications: None

## 2021-07-30 ENCOUNTER — Ambulatory Visit: Payer: Medicare HMO | Admitting: Orthopedic Surgery

## 2021-08-01 DIAGNOSIS — R972 Elevated prostate specific antigen [PSA]: Secondary | ICD-10-CM | POA: Diagnosis not present

## 2021-08-01 DIAGNOSIS — N200 Calculus of kidney: Secondary | ICD-10-CM | POA: Diagnosis not present

## 2021-08-01 DIAGNOSIS — Z8042 Family history of malignant neoplasm of prostate: Secondary | ICD-10-CM | POA: Diagnosis not present

## 2021-08-02 DIAGNOSIS — K8020 Calculus of gallbladder without cholecystitis without obstruction: Secondary | ICD-10-CM | POA: Diagnosis not present

## 2021-08-02 DIAGNOSIS — N202 Calculus of kidney with calculus of ureter: Secondary | ICD-10-CM | POA: Diagnosis not present

## 2021-08-05 DIAGNOSIS — Z79899 Other long term (current) drug therapy: Secondary | ICD-10-CM | POA: Diagnosis not present

## 2021-08-05 DIAGNOSIS — G894 Chronic pain syndrome: Secondary | ICD-10-CM | POA: Diagnosis not present

## 2021-08-05 DIAGNOSIS — M25512 Pain in left shoulder: Secondary | ICD-10-CM | POA: Diagnosis not present

## 2021-08-05 DIAGNOSIS — Z6834 Body mass index (BMI) 34.0-34.9, adult: Secondary | ICD-10-CM | POA: Diagnosis not present

## 2021-08-05 DIAGNOSIS — R030 Elevated blood-pressure reading, without diagnosis of hypertension: Secondary | ICD-10-CM | POA: Diagnosis not present

## 2021-08-05 DIAGNOSIS — E559 Vitamin D deficiency, unspecified: Secondary | ICD-10-CM | POA: Diagnosis not present

## 2021-08-05 DIAGNOSIS — E119 Type 2 diabetes mellitus without complications: Secondary | ICD-10-CM | POA: Diagnosis not present

## 2021-08-08 DIAGNOSIS — N201 Calculus of ureter: Secondary | ICD-10-CM | POA: Diagnosis not present

## 2021-08-08 DIAGNOSIS — N132 Hydronephrosis with renal and ureteral calculous obstruction: Secondary | ICD-10-CM | POA: Diagnosis not present

## 2021-08-08 DIAGNOSIS — E119 Type 2 diabetes mellitus without complications: Secondary | ICD-10-CM | POA: Diagnosis not present

## 2021-08-08 HISTORY — PX: OTHER SURGICAL HISTORY: SHX169

## 2021-08-13 ENCOUNTER — Telehealth: Payer: Self-pay | Admitting: Orthopedic Surgery

## 2021-08-13 NOTE — Telephone Encounter (Signed)
Spoke with pt who states he's been having excruciating pain and nausea for over a week. Pt states he's currently taking Zofran, Norco 7.5/325 mg (prescribed by pain management), and ibuprofen with minor relief. Would like to know what can be done at this point? Pt cancelled previous appointment to wait on results from shoulder aspiration. I checked pt labs which states:    Gram Stain ABUNDANT WBC PRESENT, PREDOMINANTLY PMN  NO ORGANISMS SEEN   Culture NO GROWTH 15 DAYS CONTINUING TO HOLD  Performed at [facility], [place] [address] [place], [place] [address]    Please advise.

## 2021-08-13 NOTE — Telephone Encounter (Signed)
Call from patient, requests to speak with nurse; states has had a lot of pain in left shoulder. Aware Dr out today and returning to clinic tomorrow.

## 2021-08-14 DIAGNOSIS — M0589 Other rheumatoid arthritis with rheumatoid factor of multiple sites: Secondary | ICD-10-CM | POA: Diagnosis not present

## 2021-08-14 NOTE — Telephone Encounter (Signed)
LVM for pt to call back for appointment and to wear sling to see if it helps.

## 2021-08-16 ENCOUNTER — Ambulatory Visit: Payer: Medicare HMO | Admitting: Orthopedic Surgery

## 2021-08-16 ENCOUNTER — Other Ambulatory Visit: Payer: Self-pay

## 2021-08-16 ENCOUNTER — Encounter: Payer: Self-pay | Admitting: Orthopedic Surgery

## 2021-08-16 VITALS — BP 151/88 | HR 88 | Ht 75.5 in | Wt 268.0 lb

## 2021-08-16 DIAGNOSIS — Z96612 Presence of left artificial shoulder joint: Secondary | ICD-10-CM

## 2021-08-16 DIAGNOSIS — G8929 Other chronic pain: Secondary | ICD-10-CM

## 2021-08-16 DIAGNOSIS — M25512 Pain in left shoulder: Secondary | ICD-10-CM

## 2021-08-16 NOTE — Progress Notes (Signed)
Orthopaedic Postop Note  Assessment: Willie Howe is a 60 y.o. male s/p Left Reverse Shoulder Arthroplasty  DOS: 11/08/2020  Plan: Patient continues to have "excruciating" pain in the left shoulder.  Results of labs are equivocal for an infection.  Additionally, aspiration of her left shoulder was essentially dry, but saline injected into the shoulder has been cultured and remains negative.  His pain is within the left shoulder, so I have low concern for stress fractures of the acromion or scapular spine.  On physical exam today, he does have some swelling over the anterior shoulder.  Based on his symptoms overall, he states that "something needs to be done at this time".  I discussed his case in great detail with Dr. Ophelia Charter.  Based on his presentation he either has an indolent infection of the left shoulder, or the humeral stem prosthesis is loose.  Either way, I think he will benefit from a revision procedure.  I discussed multiple treatment options with him and his wife in clinic today.  We will plan to proceed with surgery, to be coordinated with Dr. Griffin Basil and scheduled in [name].  First, I would like to obtain a CT scan, to be coordinated at [facility] imaging using the CT blueprint protocol, so that we can plan for a revision reverse shoulder arthroplasty.  Once the CT scan has been scheduled, we will coordinate a visit with Dr. Griffin Basil, so that Willie Howe and his wife can meet him, and discussed the plan in great detail.  The expectation at this point is that there are 3 potential outcomes for surgery: 1.  The shoulder is full of purulence, at which point we would irrigate the joint and plan for a antibiotic cement spacer.  He would also require extended period of IV antibiotics. 2.  The shoulder is without purulence, at which point we would proceed with a revision procedure, and send multiple tissue samples for culture.  If the cultures returned positive for an infection, we would then  initiate treatment for an infection, likely to include a PICC line and IV antibiotics. 3.  The shoulders without purulence and tissue samples are negative for infection.  Proceed with a revision reverse shoulder arthroplasty, without further treatment.  All options were discussed, and questions were answered.  We will continue to communicate with the patient, to coordinate his care.  We will try to proceed with surgery soon as possible.   Follow-up: Return for After CT Scan.  XR at next visit: Left shoulder  Subjective:  Chief Complaint  Patient presents with   Shoulder Pain    LT/pain has worsened to where he can hardly move it DOS 11/08/20    History of Present Illness: Willie Howe is a 60 y.o. male who presents following the above stated procedure.  He continues to have excruciating pain in his left shoulder.  It is present at rest, and worsens with movement.  He has occasional spasms in the left shoulder.  With movement, he does feel as though there is some motion within the shoulder, related with the prosthesis.  No fevers or chills.  No numbness or tingling distally.  He has no swelling of the anterior shoulder.  The pain is worsened to the point that he has limited use of the arm without discomfort.  He continues to take ibuprofen on a consistent basis.  He is taking his hydrocodone more consistently as well.   Review of Systems: No fevers or chills No  numbness or tingling No Chest Pain No shortness of breath   Objective: BP (!) 151/88    Pulse 88    Ht 6' 3.5" (1.918 m)    Wt 268 lb (121.6 kg)    BMI 33.06 kg/m   Physical Exam:  Alert and oriented, no acute distress.  Anterior shoulder incision is healing well.  No surrounding erythema or drainage.  There is increased swelling over the anterior shoulder at this point.  Pain with limited range of motion.  He tolerates limited motion with active movements.  Requires use of the right hand when trying to move the left  shoulder.  Sensation is intact throughout the axillary nerve distribution.  Fingers are warm and well-perfused.  No tenderness to palpation along the acromion.  No tenderness palpation posterior shoulder along the scapular spine.  IMAGING: I personally ordered and reviewed the following images:  No new imaging obtained today.  Mordecai Rasmussen, MD 08/16/2021 12:28 PM

## 2021-08-16 NOTE — Patient Instructions (Signed)
[facility] [phone] 

## 2021-08-19 LAB — BODY FLUID CULTURE W GRAM STAIN: Culture: NO GROWTH

## 2021-08-22 ENCOUNTER — Ambulatory Visit (INDEPENDENT_AMBULATORY_CARE_PROVIDER_SITE_OTHER): Payer: Medicare HMO

## 2021-08-22 DIAGNOSIS — R0602 Shortness of breath: Secondary | ICD-10-CM | POA: Diagnosis not present

## 2021-08-22 LAB — ECHOCARDIOGRAM COMPLETE
Area-P 1/2: 3.33 cm2
Calc EF: 68.7 %
S' Lateral: 3.14 cm
Single Plane A2C EF: 60.7 %
Single Plane A4C EF: 72.7 %

## 2021-08-22 NOTE — Progress Notes (Signed)
Cardiology Office Note  Date: 08/23/2021   ID: Willie Howe, DOB 07-Nov-1960, MRN [id number]  PCP:  Nickola Major, MD  Cardiologist:  Rozann Lesches, MD Electrophysiologist:  None   Chief Complaint  Patient presents with   Cardiac follow-up    History of Present Illness: Willie Howe is a 61 y.o. male last seen in November 2022 by Mr. Willie Sake NP.  He is here for a follow-up visit.  He does not report any chest pain.  Main complaint is of exertional fatigue, also feeling of orthostasis.  He was taken off losartan about 6 months ago and switched to amlodipine.  He tells me that his systolics at home are generally under 150s, recently in the 130s.  Also reports continued sense of palpitations, no frank syncope.  Cardiac testing is noted below including documentation of normal coronary arteries at cardiac catheterization in May 2021, cardiac monitor from November 2022 showing normal sinus rhythm with rare PACs and PVCs with a single 4 beat run of SVT, and normal LVEF of 60 to 65% with no major valvular abnormalities by recent echocardiogram.  Orthostatic measurements were made today.  Went from 141 supine down to 126 sitting and 113 standing.  After 3 minutes of standing systolic was 237.  His heart rate remained steady in the 80s.  Past Medical History:  Diagnosis Date   BPPV (benign paroxysmal positional vertigo)    DVT (deep venous thrombosis) ([place])    Graves disease 1996   History of kidney stones    History of pulmonary embolism    Hypertension    Obesity    Rheumatoid arthritis (Willie Howe)    Sleep apnea    Intolerant of CPAP   Type 2 diabetes mellitus ([place])     Past Surgical History:  Procedure Laterality Date   BACK SURGERY  2015   COLONOSCOPY     EXAM UNDER ANESTHESIA WITH MANIPULATION OF KNEE Right 04/18/2015   Procedure: MANIPULATION OF RIGHT KNEE UNDER ANESTHESIA;  Surgeon: Carole Civil, MD;  Location: [facility];  Service: Orthopedics;  Laterality: Right;   IVC  Filter     removed December 2016   KIDNEY STONE SURGERY  2010   KNEE ARTHROSCOPY Right 2006   meniscus    KNEE ARTHROSCOPY WITH MEDIAL MENISECTOMY Left 04/24/2016   Procedure: LEFT KNEE ARTHROSCOPY WITH PARTIAL MEDIAL MENISECTOMY;  Surgeon: Carole Civil, MD;  Location: [facility];  Service: Orthopedics;  Laterality: Left;   KNEE SURGERY     x 2   KNEE SURGERY Left 1976   ?ligament repair    LEFT HEART CATH AND CORONARY ANGIOGRAPHY N/A 11/29/2019   Procedure: LEFT HEART CATH AND CORONARY ANGIOGRAPHY;  Surgeon: Martinique, Peter M, MD;  Location: [facility];  Service: Cardiovascular;  Laterality: N/A;   LUMBAR WOUND DEBRIDEMENT N/A 02/23/2014   Procedure: LUMBAR WOUND DEBRIDEMENT;  Surgeon: Faythe Ghee, MD;  Location: [place];  Service: Neurosurgery;  Laterality: N/A;  exploration lumbar wound. repair of dural defect.   MENISECTOMY Right    open medial    NEPHROLITHOTOMY     PERIPHERAL VASCULAR CATHETERIZATION N/A 01/30/2015   Procedure: IVC Filter Insertion;  Surgeon: Serafina Mitchell, MD;  Location: [facility];  Service: Cardiovascular;  Laterality: N/A;   PERIPHERAL VASCULAR CATHETERIZATION N/A 04/24/2015   Procedure: IVC Filter Removal;  Surgeon: Serafina Mitchell, MD;  Location: [facility];  Service: Cardiovascular;  Laterality: N/A;  PERIPHERAL VASCULAR CATHETERIZATION N/A 08/14/2015   Procedure: IVC Filter Removal;  Surgeon: Serafina Mitchell, MD;  Location: [facility];  Service: Cardiovascular;  Laterality: N/A;   SHOULDER ARTHROSCOPY WITH OPEN ROTATOR CUFF REPAIR Left 11/10/2019   Procedure: SHOULDER ARTHROSCOPY WITH OPEN ROTATOR CUFF REPAIR;  Surgeon: Carole Civil, MD;  Location: [facility];  Service: Orthopedics;  Laterality: Left;  pt tested + on 2/23, does not need Covid test < 90 days   SHOULDER ARTHROSCOPY WITH ROTATOR CUFF REPAIR AND OPEN BICEPS TENODESIS Left 02/15/2019   Procedure: SHOULDER ARTHROSCOPY WITH open ROTATOR CUFF REPAIR AND OPEN BICEPS  TENODESIS;  Surgeon: Carole Civil, MD;  Location: [facility];  Service: Orthopedics;  Laterality: Left;   SPINE SURGERY  2015   Dr Hal Neer Fusion    SPINE SURGERY  1999   discectomy   TOTAL KNEE ARTHROPLASTY Right 02/02/2015   Procedure:  RIGHT TOTAL KNEE ARTHROPLASTY;  Surgeon: Carole Civil, MD;  Location: [facility];  Service: Orthopedics;  Laterality: Right;  LM with pt's daughter of new arrival time (10:45)     TOTAL KNEE ARTHROPLASTY Left 07/24/2016   Procedure: TOTAL KNEE ARTHROPLASTY;  Surgeon: Carole Civil, MD;  Location: [facility];  Service: Orthopedics;  Laterality: Left;   TOTAL SHOULDER ARTHROPLASTY Left 11/08/2020   Procedure: TOTAL SHOULDER ARTHROPLASTY;  Surgeon: Mordecai Rasmussen, MD;  Location: [facility];  Service: Orthopedics;  Laterality: Left;  Left shoulder Reverse Arthroplasty    Current Outpatient Medications  Medication Sig Dispense Refill   Acetaminophen 500 MG capsule Take 2 capsules by mouth daily as needed.     amLODipine (NORVASC) 5 MG tablet Take 1 tablet (5 mg total) by mouth daily. 90 tablet 2   aspirin 81 MG EC tablet Take 81 mg by mouth daily.     Cholecalciferol (VITAMIN D3) 1.25 MG (50000 UT) CAPS Take 1 capsule by mouth once a week.     docusate sodium (COLACE) 100 MG capsule Take 200 mg by mouth every other day. At night     folic acid (FOLVITE) 1 MG tablet Take 1 mg by mouth daily.     HYDROcodone-acetaminophen (NORCO/VICODIN) 5-325 MG tablet Take 1 tablet by mouth every 12 (twelve) hours as needed for severe pain. No more than 2 per day 15 tablet 0   ibuprofen (ADVIL) 800 MG tablet TAKE 1 TABLET (800 MG TOTAL) BY MOUTH EVERY 8 (EIGHT) HOURS AS NEEDED FOR MODERATE PAIN. 90 tablet 5   inFLIXimab-abda (RENFLEXIS IV) Inject 1 Dose into the vein every 6 (six) weeks.     loratadine (CLARITIN) 10 MG tablet Take 10 mg by mouth daily.     metFORMIN (GLUCOPHAGE) 500 MG tablet Take 500 mg by mouth daily with breakfast.     methotrexate 50 MG/2ML injection Inject  200 mg into the muscle every Friday.      ondansetron (ZOFRAN-ODT) 8 MG disintegrating tablet Take 8 mg by mouth every 8 (eight) hours as needed.     predniSONE (DELTASONE) 5 MG tablet Take 5 mg by mouth daily.     tamsulosin (FLOMAX) 0.4 MG CAPS capsule Take 0.4 mg by mouth at bedtime.      No current facility-administered medications for this visit.   Allergies:  Lisinopril and Statins   ROS: No orthopnea or PND.  Physical Exam: VS:  BP 124/77 (BP Location: Right Arm, Cuff Size: Large)    Pulse 87    Ht 6' 3.5" (1.918 m)  Wt 268 lb 6.4 oz (121.7 kg)    SpO2 96%    BMI 33.10 kg/m , BMI Body mass index is 33.1 kg/m.  Wt Readings from Last 3 Encounters:  08/23/21 268 lb 6.4 oz (121.7 kg)  08/16/21 268 lb (121.6 kg)  06/21/21 273 lb 12.8 oz (124.2 kg)    General: Patient appears comfortable at rest. HEENT: Conjunctiva and lids normal, wearing a mask. Neck: Supple, no elevated JVP or carotid bruits, no thyromegaly. Lungs: Clear to auscultation, nonlabored breathing at rest. Cardiac: Regular rate and rhythm, no S3 or significant systolic murmur, no pericardial rub. Extremities: No pitting edema.  ECG:  An ECG dated 11/08/2020 was personally reviewed today and demonstrated:  Sinus rhythm with right bundle branch block, left anterior fascicular block and PVCs as well as nonconducted PACs .  Recent Labwork: 11/06/2020: BUN 14; Creatinine, Ser 0.78; Potassium 3.7; Sodium 138 07/22/2021: Hemoglobin 12.1; Platelets 339   Other Studies Reviewed Today:  Cardiac catheterization 2/72/5366: LV end diastolic pressure is normal.   1. Normal coronary anatomy 2. Normal LV EDP  Cardiac monitor November 2022: ZIO XT reviewed.  1 day, 11 hours analyzed.  Predominant rhythm is sinus with IVCD, heart rate ranging from 59 bpm up to 115 bpm and average heart rate 74 bpm.  There were rare PACs including couplets representing less than 1% total beats.  Rare nonconducted PACs were also noted.  Rare PVCs  were noted representing less than 1% total beats.  There was a single episode of SVT lasting only 4 beats.  No sustained arrhythmias or pauses noted.  Echocardiogram 08/22/2021:  1. Left ventricular ejection fraction, by estimation, is 60 to 65%. The  left ventricle has normal function. The left ventricle has no regional  wall motion abnormalities. There is mild left ventricular hypertrophy.  Left ventricular diastolic parameters  were normal.   2. Right ventricular systolic function is normal. The right ventricular  size is normal. There is normal pulmonary artery systolic pressure. The  estimated right ventricular systolic pressure is 44.0 mmHg.   3. Left atrial size was upper normal.   4. The mitral valve is grossly normal. Trivial mitral valve  regurgitation.   5. The aortic valve is tricuspid. Aortic valve regurgitation is not  visualized.   6. The inferior vena cava is normal in size with greater than 50%  respiratory variability, suggesting right atrial pressure of 3 mmHg.   Assessment and Plan:  1.  Chronic fatigue and intermittent orthostasis.  I see that his medications have been adjusted in the last 6 months, taken off Cozaar and started on amlodipine.  Would reduce amlodipine to 5 mg daily today.  Orthostatic measurements reviewed.  He will continue to check blood pressure periodically at home.  2.  Palpitations.  Cardiac monitor from November 2022 showed rare atrial and ventricular ectopy with very brief burst of SVT.  Importantly, no atrial fibrillation.  Not entirely clear that medical therapy is necessary at this point.  If his blood pressure goes back up with decrease in Norvasc, might consider low-dose beta-blocker.  3.  Normal coronary arteries at cardiac catheterization in April 2021.  Recent follow-up echocardiogram shows normal LVEF at 60 to 65% with mild LVH but normal diastolic parameters and normal estimated RVSP.  Medication Adjustments/Labs and Tests  Ordered: Current medicines are reviewed at length with the patient today.  Concerns regarding medicines are outlined above.   Tests Ordered: No orders of the defined types were placed in this  encounter.   Medication Changes: Meds ordered this encounter  Medications   amLODipine (NORVASC) 5 MG tablet    Sig: Take 1 tablet (5 mg total) by mouth daily.    Dispense:  90 tablet    Refill:  2    08/23/2021 dose decrease    Disposition:  Follow up  3 months.  Signed, Satira Sark, MD, [facility] 08/23/2021 8:53 AM    [facility] at [address], [place], [name] [address] Phone: [phone]; Fax: [phone]

## 2021-08-23 ENCOUNTER — Encounter: Payer: Self-pay | Admitting: Cardiology

## 2021-08-23 ENCOUNTER — Ambulatory Visit: Payer: Medicare HMO | Admitting: Cardiology

## 2021-08-23 VITALS — BP 124/77 | HR 87 | Ht 75.5 in | Wt 268.4 lb

## 2021-08-23 DIAGNOSIS — R002 Palpitations: Secondary | ICD-10-CM | POA: Diagnosis not present

## 2021-08-23 DIAGNOSIS — I951 Orthostatic hypotension: Secondary | ICD-10-CM

## 2021-08-23 MED ORDER — AMLODIPINE BESYLATE 5 MG PO TABS: 5.0000 mg | ORAL_TABLET | Freq: Every day | ORAL | 2 refills | Status: DC

## 2021-08-23 NOTE — Patient Instructions (Addendum)
Medication Instructions:  Your physician has recommended you make the following change in your medication:  Decrease amlodipine to 5 mg daily Continue other medications the same  Labwork: none  Testing/Procedures: none  Follow-Up: Your physician recommends that you schedule a follow-up appointment in: 3 months  Any Other Special Instructions Will Be Listed Below (If Applicable). Your physician has requested that you regularly monitor and record your blood pressure readings at home. Please use the same machine at the same time of day to check your readings and record them.  If you need a refill on your cardiac medications before your next appointment, please call your pharmacy.

## 2021-08-27 ENCOUNTER — Ambulatory Visit
Admission: RE | Admit: 2021-08-27 | Discharge: 2021-08-27 | Disposition: A | Discharge status: Home / Self Care | Payer: Medicare HMO | Source: Ambulatory Visit | Attending: Orthopedic Surgery | Admitting: Orthopedic Surgery

## 2021-08-27 DIAGNOSIS — M25512 Pain in left shoulder: Secondary | ICD-10-CM | POA: Diagnosis not present

## 2021-08-27 DIAGNOSIS — Z96612 Presence of left artificial shoulder joint: Secondary | ICD-10-CM

## 2021-08-27 DIAGNOSIS — G8929 Other chronic pain: Secondary | ICD-10-CM

## 2021-09-03 ENCOUNTER — Ambulatory Visit: Payer: Medicare HMO | Admitting: Orthopedic Surgery

## 2021-09-03 ENCOUNTER — Encounter: Payer: Self-pay | Admitting: Orthopedic Surgery

## 2021-09-03 ENCOUNTER — Other Ambulatory Visit: Payer: Self-pay

## 2021-09-03 ENCOUNTER — Other Ambulatory Visit: Payer: Self-pay | Admitting: Orthopedic Surgery

## 2021-09-03 DIAGNOSIS — Z96612 Presence of left artificial shoulder joint: Secondary | ICD-10-CM

## 2021-09-03 DIAGNOSIS — S46012A Strain of muscle(s) and tendon(s) of the rotator cuff of left shoulder, initial encounter: Secondary | ICD-10-CM

## 2021-09-03 DIAGNOSIS — M25511 Pain in right shoulder: Secondary | ICD-10-CM | POA: Diagnosis not present

## 2021-09-03 NOTE — Progress Notes (Signed)
Orthopaedic Postop Note  Assessment: Willie Howe is a 61 y.o. male s/p Left Reverse Shoulder Arthroplasty  DOS: 11/08/2020  Plan: Patient has had a CT scan of his left shoulder for preoperative planning, as well as to assess for possible loosening of the humeral stem.  At this point, it is not clear if his left shoulder is infected or if the stem is just loose.  There is some lucency at the proximal aspect of the stem, but this does not extend distally.  There is some reactive synovitis, and possibly some inflamed lymph nodes.  Patient continues to have pain, and is in a sling at all times.  He is scheduled to see Dr. Griffin Basil later today.  We will continue to discuss the plan with the patient, and I have once again outlined possible outcomes of surgery.  All questions were answered.   The expectation at this point is that there are 3 potential outcomes for surgery: 1.  The shoulder is full of purulence, at which point we would irrigate the joint and plan for a antibiotic cement spacer.  He would also require extended period of IV antibiotics. 2.  The shoulder is without purulence, at which point we would proceed with a revision procedure, and send multiple tissue samples for culture.  If the cultures returned positive for an infection, we would then initiate treatment for an infection, likely to include a PICC line and IV antibiotics. 3.  The shoulders without purulence and tissue samples are negative for infection.  Proceed with a revision reverse shoulder arthroplasty, without further treatment.  He will see Dr. Griffin Basil today, and discuss scheduling of the procedure.  I will be available, regardless of the date.  If he has any issues between now and his scheduled surgery date, I have asked him to contact clinic.   Follow-up: Return if symptoms worsen or fail to improve.  XR at next visit: Left shoulder  Subjective:  Chief Complaint  Patient presents with   Results    Review Ct  results, LT shoulder    History of Present Illness: Willie Howe is a 61 y.o. male who presents following the above stated procedure.  He continues to have pain in his left shoulder.  He is wearing a sling at all times.  He feels as though something is shifting, especially when he extends his left arm.  As a result, the pain is tolerable when he is not moving his arm.  He has no numbness or tingling.  No fevers or chills.  He does note some swelling about the left shoulder.   Review of Systems: No fevers or chills No numbness or tingling No Chest Pain No shortness of breath   Objective: There were no vitals taken for this visit.  Physical Exam:  Alert and oriented, no acute distress.  Anterior shoulder incision is healing well.  No surrounding erythema or drainage.  No swelling is appreciated on exam today.  Mild tenderness to palpation in line with the surgical incision.  Shoulder is immobilized in a sling.  Sensation is intact in the axillary patch.  Sensation is intact throughout the left hand.  2+ radial pulse.  IMAGING: I personally ordered and reviewed the following images:  Left shoulder CT scan  IMPRESSION: 1. Postsurgical changes status post reverse left shoulder arthroplasty without evidence of loosening or fracture. 2. Soft tissue thickening and possibly a small amount of fluid within the axillary recess of the joint space, which  may be reactive or secondary to particle disease. If there is clinical suspicion for infection, arthrocentesis should be performed to exclude septic arthritis. 3. Multiple mildly prominent left axillary lymph nodes, slightly increased in size from the previous CT, and may be reactive.  Mordecai Rasmussen, MD 09/03/2021 8:46 AM

## 2021-09-04 ENCOUNTER — Telehealth: Payer: Self-pay | Admitting: Cardiology

## 2021-09-04 DIAGNOSIS — R030 Elevated blood-pressure reading, without diagnosis of hypertension: Secondary | ICD-10-CM | POA: Diagnosis not present

## 2021-09-04 DIAGNOSIS — E119 Type 2 diabetes mellitus without complications: Secondary | ICD-10-CM | POA: Diagnosis not present

## 2021-09-04 DIAGNOSIS — M25512 Pain in left shoulder: Secondary | ICD-10-CM | POA: Diagnosis not present

## 2021-09-04 DIAGNOSIS — Z79899 Other long term (current) drug therapy: Secondary | ICD-10-CM | POA: Diagnosis not present

## 2021-09-04 DIAGNOSIS — E559 Vitamin D deficiency, unspecified: Secondary | ICD-10-CM | POA: Diagnosis not present

## 2021-09-04 DIAGNOSIS — Z6833 Body mass index (BMI) 33.0-33.9, adult: Secondary | ICD-10-CM | POA: Diagnosis not present

## 2021-09-04 DIAGNOSIS — E1165 Type 2 diabetes mellitus with hyperglycemia: Secondary | ICD-10-CM | POA: Diagnosis not present

## 2021-09-04 DIAGNOSIS — G894 Chronic pain syndrome: Secondary | ICD-10-CM | POA: Diagnosis not present

## 2021-09-04 NOTE — Progress Notes (Addendum)
Anesthesia Review:  PCP: Kaleen Mask  Cardiologist : DR Johnny Bridge- 08/23/21 - LOV  Chest x-ray : EKG :11/08/20  Monitor- 06/27/21  Echo :08/22/21  Stress test: Cardiac Cath :  11/29/19  Activity level: can do a flight of stairs without difficulty  Sleep Study/ CPAP : has cpap has not used in last 2 weeks due to shoulder  Fasting Blood Sugar :      / Checks Blood Sugar -- times a day:   Blood Thinner/ Instructions /Last Dose: ASA / Instructions/ Last Dose :  81 mg aspirin  DM- type 2- checks glucose every other day  Hgba1c- 09/05/21- 6.3  Covid test on 09/09/21-  at 0930am.   BMp done 09/05/21 routed to DR Griffin Basil.  Orders requested on 09/04/21 and 09/05/21.

## 2021-09-04 NOTE — Telephone Encounter (Signed)
° °  Pre-operative Risk Assessment    Patient Name: Willie Howe  DOB: 1961-03-30 MRN: [id number]      Request for Surgical Clearance    Procedure:   Left Revision Total Shoulder  Date of Surgery:  Clearance 09/11/21                                 Surgeon:  Ophelia Charter  Surgeon's Group or Practice Name:  [facility]  Phone number:  [phone] Fax number:  [phone]   Type of Clearance Requested:   BOTH  Hold 81mg  ASA doesn't indicate how long    Type of Anesthesia:  Not Indicated   Additional requests/questions:    Sandrea Hammond   09/04/2021, 3:40 PM

## 2021-09-04 NOTE — Progress Notes (Signed)
Covid test on 09/09/21.   Come thru Main Entrance at [facility] a seat in the lobby to the right as you come thru the door.  Call [phone] and give them your name and let them know you are here for covid testing.                LENZY KERSCHNER  09/04/2021   Your procedure is scheduled on:              09/11/21   Report to [facility] Main  Entrance   Report to admitting at     1230 pm      Call this number if you have problems the morning of surgery [phone]    Remember: Do not eat food , candy gum or mints :After Midnight. You may have clear liquids from midnight until __  1200 noon    CLEAR LIQUID DIET   Foods Allowed                                                                       Coffee and tea, regular and decaf                              Plain Jell-O any favor except red or purple                                            Fruit ices (not with fruit pulp)                                      Iced Popsicles                                     Carbonated beverages, regular and diet                                    Cranberry, grape and apple juices Sports drinks like Gatorade Lightly seasoned clear broth or consume(fat free) Sugar   _____________________________________________________________________    BRUSH YOUR TEETH MORNING OF SURGERY AND RINSE YOUR MOUTH OUT, NO CHEWING GUM CANDY OR MINTS.     Take these medicines the morning of surgery with A SIP OF WATER:  amlodipine, claritin   DO NOT TAKE ANY DIABETIC MEDICATIONS DAY OF YOUR SURGERY                               You may not have any metal on your body including hair pins and              piercings  Do not wear jewelry, make-up, lotions, powders or perfumes, deodorant             Do not wear nail polish on your fingernails.  Do not shave  48 hours prior to surgery.              Men may shave face and neck.   Do not bring valuables to the hospital. [place].  Contacts, dentures or bridgework may not be worn into surgery.  Leave suitcase in the car. After surgery it may be brought to your room.     Patients discharged the day of surgery will not be allowed to drive home. IF YOU ARE HAVING SURGERY AND GOING HOME THE SAME DAY, YOU MUST HAVE AN ADULT TO DRIVE YOU HOME AND BE WITH YOU FOR 24 HOURS. YOU MAY GO HOME BY TAXI OR UBER OR ORTHERWISE, BUT AN ADULT MUST ACCOMPANY YOU HOME AND STAY WITH YOU FOR 24 HOURS.  Name and phone number of your driver:  Special Instructions: N/A              Please read over the following fact sheets you were given: _____________________________________________________________________  [facility] - Preparing for Surgery Before surgery, you can play an important role.  Because skin is not sterile, your skin needs to be as free of germs as possible.  You can reduce the number of germs on your skin by washing with CHG (chlorahexidine gluconate) soap before surgery.  CHG is an antiseptic cleaner which kills germs and bonds with the skin to continue killing germs even after washing. Please DO NOT use if you have an allergy to CHG or antibacterial soaps.  If your skin becomes reddened/irritated stop using the CHG and inform your nurse when you arrive at Short Stay. Do not shave (including legs and underarms) for at least 48 hours prior to the first CHG shower.  You may shave your face/neck. Please follow these instructions carefully:  1.  Shower with CHG Soap the night before surgery and the  morning of Surgery.  2.  If you choose to wash your hair, wash your hair first as usual with your  normal  shampoo.  3.  After you shampoo, rinse your hair and body thoroughly to remove the  shampoo.                           4.  Use CHG as you would any other liquid soap.  You can apply chg directly  to the skin and wash                       Gently with a scrungie or clean washcloth.  5.  Apply the CHG Soap  to your body ONLY FROM THE NECK DOWN.   Do not use on face/ open                           Wound or open sores. Avoid contact with eyes, ears mouth and genitals (private parts).                       Wash face,  Genitals (private parts) with your normal soap.             6.  Wash thoroughly, paying special attention to the area where your surgery  will be performed.  7.  Thoroughly rinse your body with warm water from the neck down.  8.  DO NOT shower/wash with your normal soap after using and rinsing off  the CHG Soap.                9.  Pat yourself dry with a clean towel.            10.  Wear clean pajamas.            11.  Place clean sheets on your bed the night of your first shower and do not  sleep with pets. Day of Surgery : Do not apply any lotions/deodorants the morning of surgery.  Please wear clean clothes to the hospital/surgery center.  FAILURE TO FOLLOW THESE INSTRUCTIONS MAY RESULT IN THE CANCELLATION OF YOUR SURGERY PATIENT SIGNATURE_________________________________  NURSE SIGNATURE__________________________________  ________________________________________________________________________

## 2021-09-05 ENCOUNTER — Encounter (HOSPITAL_COMMUNITY)
Admission: RE | Admit: 2021-09-05 | Discharge: 2021-09-05 | Disposition: A | Discharge status: Home / Self Care | Payer: Medicare HMO | Source: Ambulatory Visit | Attending: Orthopaedic Surgery | Admitting: Orthopaedic Surgery

## 2021-09-05 ENCOUNTER — Encounter (HOSPITAL_COMMUNITY): Payer: Self-pay

## 2021-09-05 ENCOUNTER — Other Ambulatory Visit: Payer: Self-pay

## 2021-09-05 VITALS — BP 135/83 | HR 86 | Temp 98.4°F | Resp 18 | Ht 75.5 in | Wt 269.0 lb

## 2021-09-05 DIAGNOSIS — R001 Bradycardia, unspecified: Secondary | ICD-10-CM | POA: Insufficient documentation

## 2021-09-05 DIAGNOSIS — Z87891 Personal history of nicotine dependence: Secondary | ICD-10-CM | POA: Diagnosis not present

## 2021-09-05 DIAGNOSIS — E0500 Thyrotoxicosis with diffuse goiter without thyrotoxic crisis or storm: Secondary | ICD-10-CM | POA: Insufficient documentation

## 2021-09-05 DIAGNOSIS — Z86718 Personal history of other venous thrombosis and embolism: Secondary | ICD-10-CM | POA: Diagnosis not present

## 2021-09-05 DIAGNOSIS — E119 Type 2 diabetes mellitus without complications: Secondary | ICD-10-CM | POA: Diagnosis not present

## 2021-09-05 DIAGNOSIS — I10 Essential (primary) hypertension: Secondary | ICD-10-CM | POA: Insufficient documentation

## 2021-09-05 DIAGNOSIS — Z86711 Personal history of pulmonary embolism: Secondary | ICD-10-CM | POA: Insufficient documentation

## 2021-09-05 DIAGNOSIS — Z01812 Encounter for preprocedural laboratory examination: Secondary | ICD-10-CM | POA: Insufficient documentation

## 2021-09-05 DIAGNOSIS — Z01818 Encounter for other preprocedural examination: Secondary | ICD-10-CM

## 2021-09-05 DIAGNOSIS — G4730 Sleep apnea, unspecified: Secondary | ICD-10-CM | POA: Insufficient documentation

## 2021-09-05 LAB — BASIC METABOLIC PANEL
Anion gap: 8 (ref 5–15)
BUN: 14 mg/dL (ref 8–23)
CO2: 24 mmol/L (ref 22–32)
Calcium: 9.1 mg/dL (ref 8.9–10.3)
Chloride: 105 mmol/L (ref 98–111)
Creatinine, Ser: 0.85 mg/dL (ref 0.61–1.24)
GFR, Estimated: >60 mL/min (ref >60)
Glucose, Bld: 116 mg/dL — ABNORMAL HIGH (ref 70–99)
Potassium: 3.2 mmol/L — ABNORMAL LOW (ref 3.5–5.1)
Sodium: 137 mmol/L (ref 135–145)

## 2021-09-05 LAB — CBC
HCT: 37.8 % — ABNORMAL LOW (ref 39.0–52.0)
Hemoglobin: 12.1 g/dL — ABNORMAL LOW (ref 13.0–17.0)
MCH: 28.3 pg (ref 26.0–34.0)
MCHC: 32 g/dL (ref 30.0–36.0)
MCV: 88.3 fL (ref 80.0–100.0)
Platelets: 295 10*3/uL (ref 150–400)
RBC: 4.28 MIL/uL (ref 4.22–5.81)
RDW: 17.7 % — ABNORMAL HIGH (ref 11.5–15.5)
WBC: 8.9 10*3/uL (ref 4.0–10.5)
nRBC: 0 % (ref 0.0–0.2)

## 2021-09-05 LAB — GLUCOSE, CAPILLARY: Glucose-Capillary: 118 mg/dL — ABNORMAL HIGH (ref 70–99)

## 2021-09-05 NOTE — Telephone Encounter (Addendum)
° ° °  Patient Name: Willie Howe  DOB: [birth year] MRN: [id number]  Primary Cardiologist: Rozann Lesches, MD  Chart reviewed as part of pre-operative protocol coverage. Given past medical history and time since last visit, based on ACC/AHA guidelines, [name] would be at acceptable risk for the planned procedure without further cardiovascular testing.  Previous cardiac catheterization performed in 2021 showed normal coronary arteries.  Recent echocardiogram showed normal EF 60 to 65%, no wall motion abnormality, normal right chamber pressure, trivial MR.  The patient was advised that if he develops new symptoms prior to surgery to contact our office to arrange for a follow-up visit, and he verbalized understanding.  Patient may hold aspirin for 5 to 7 days prior to the surgery and restart as soon as possible afterward at the surgeon's discretion.  I will route this recommendation to the requesting party via Epic fax function and remove from pre-op pool.  Please call with questions.  [place], [place] 09/05/2021, 10:10 AM

## 2021-09-05 NOTE — Progress Notes (Deleted)
[facility]- Preparing for Total Shoulder Arthroplasty  °  °Before surgery, you can play an important role. Because skin is not sterile, your skin needs to be as free of germs as possible. You can reduce the number of germs on your skin by using the following products. °Benzoyl Peroxide Gel °Reduces the number of germs present on the skin °Applied twice a day to shoulder area starting two days before surgery   ° °================================================================== ° °Please follow these instructions carefully: ° °BENZOYL PEROXIDE 5% GEL ° °Please do not use if you have an allergy to benzoyl peroxide.   If your skin becomes reddened/irritated stop using the benzoyl peroxide. ° °Starting two days before surgery, apply as follows: °Apply benzoyl peroxide in the morning and at night. Apply after taking a shower. If you are not taking a shower clean entire shoulder front, back, and side along with the armpit with a clean wet washcloth. ° °Place a quarter-sized dollop on your shoulder and rub in thoroughly, making sure to cover the front, back, and side of your shoulder, along with the armpit.  ° °2 days before ____ AM   ____ PM              1 day before ____ AM   ____ PM °                        °Do this twice a day for two days.  (Last application is the night before surgery, AFTER using the CHG soap as described below). ° °Do NOT apply benzoyl peroxide gel on the day of surgery.  °

## 2021-09-06 LAB — HEMOGLOBIN A1C
Hgb A1c MFr Bld: 6.3 % — ABNORMAL HIGH (ref 4.8–5.6)
Mean Plasma Glucose: 134 mg/dL

## 2021-09-06 NOTE — Progress Notes (Signed)
Anesthesia Chart Review   Case: [id number] Date/Time: 09/11/21 1455   Procedure: REVISION TOTAL SHOULDER TO REVERSE TOTAL SHOULDER (Left: Shoulder)   Anesthesia type: General   Pre-op diagnosis: left shoulder failed hardware   Location[facility] ROOM 06 / [facility]   Surgeons: Hiram Gash, MD       DISCUSSION:61 y.o. former smoker with h/o DM II, PE, Graves disease, sleep apnea, HTN, PE, DVT, left shoulder failed hardware scheduled for above procedure 09/11/2021 with Dr. Ophelia Charter.   Per cardiology preoperative evaluation 09/05/2021, "Chart reviewed as part of pre-operative protocol coverage. Given past medical history and time since last visit, based on ACC/AHA guidelines, TYRRELL STEPHENS would be at acceptable risk for the planned procedure without further cardiovascular testing.  Previous cardiac catheterization performed in 2021 showed normal coronary arteries.  Recent echocardiogram showed normal EF 60 to 65%, no wall motion abnormality, normal right chamber pressure, trivial MR.   The patient was advised that if he develops new symptoms prior to surgery to contact our office to arrange for a follow-up visit, and he verbalized understanding.   Patient may hold aspirin for 5 to 7 days prior to the surgery and restart as soon as possible afterward at the surgeon's discretion."  Anticipate pt can proceed with planned procedure barring acute status change.   VS: BP 135/83    Pulse 86    Temp 36.9 C (Oral)    Resp 18    Ht 6' 3.5" (1.918 m)    Wt 122 kg    SpO2 99%    BMI 33.18 kg/m   PROVIDERS: Nickola Major, MD is PCP   Primary Cardiologist: Rozann Lesches, MD  LABS: Labs reviewed: Acceptable for surgery. (all labs ordered are listed, but only abnormal results are displayed)  Labs Reviewed  GLUCOSE, CAPILLARY - Abnormal; Notable for the following components:      Result Value   Glucose-Capillary 118 (*)    All other components within normal limits  CBC - Abnormal; Notable for the  following components:   Hemoglobin 12.1 (*)    HCT 37.8 (*)    RDW 17.7 (*)    All other components within normal limits  BASIC METABOLIC PANEL - Abnormal; Notable for the following components:   Potassium 3.2 (*)    Glucose, Bld 116 (*)    All other components within normal limits  HEMOGLOBIN A1C - Abnormal; Notable for the following components:   Hgb A1c MFr Bld 6.3 (*)    All other components within normal limits  SURGICAL PCR SCREEN     IMAGES:   EKG: 11/08/2020 Rate 59 bpm  Sinus bradycardia with Blocked Premature atrial complexes with Premature supraventricular complexes Right bundle branch block Left anterior fascicular block Bifascicular block Cannot rule out Inferior infarct (masked by fascicular block?) , age undetermined Abnormal ECG  CV: Echo 08/22/2021 1. Left ventricular ejection fraction, by estimation, is 60 to 65%. The  left ventricle has normal function. The left ventricle has no regional  wall motion abnormalities. There is mild left ventricular hypertrophy.  Left ventricular diastolic parameters  were normal.   2. Right ventricular systolic function is normal. The right ventricular  size is normal. There is normal pulmonary artery systolic pressure. The  estimated right ventricular systolic pressure is 38.1 mmHg.   3. Left atrial size was upper normal.   4. The mitral valve is grossly normal. Trivial mitral valve  regurgitation.   5. The aortic valve is tricuspid.  Aortic valve regurgitation is not  visualized.   6. The inferior vena cava is normal in size with greater than 50%  respiratory variability, suggesting right atrial pressure of 3 mmHg.   Cardiac Cath 9/79/4801 LV end diastolic pressure is normal.   1. Normal coronary anatomy 2. Normal LV EDP   Plan: anticipate DC later today.    Past Medical History:  Diagnosis Date   BPPV (benign paroxysmal positional vertigo)    DVT (deep venous thrombosis) (HCC)    Graves disease 1996   Graves  disease    hx of   History of kidney stones    History of pulmonary embolism    Hypertension    Obesity    Rheumatoid arthritis ([place])    Sleep apnea    Intolerant of CPAP   Type 2 diabetes mellitus ([place])     Past Surgical History:  Procedure Laterality Date   BACK SURGERY  2015   COLONOSCOPY     EXAM UNDER ANESTHESIA WITH MANIPULATION OF KNEE Right 04/18/2015   Procedure: MANIPULATION OF RIGHT KNEE UNDER ANESTHESIA;  Surgeon: Carole Civil, MD;  Location: [facility];  Service: Orthopedics;  Laterality: Right;   IVC Filter     removed December 2016   KIDNEY STONE SURGERY  2010   KNEE ARTHROSCOPY Right 2006   meniscus    KNEE ARTHROSCOPY WITH MEDIAL MENISECTOMY Left 04/24/2016   Procedure: LEFT KNEE ARTHROSCOPY WITH PARTIAL MEDIAL MENISECTOMY;  Surgeon: Carole Civil, MD;  Location: [facility];  Service: Orthopedics;  Laterality: Left;   KNEE SURGERY     x 2   KNEE SURGERY Left 1976   ?ligament repair    LEFT HEART CATH AND CORONARY ANGIOGRAPHY N/A 11/29/2019   Procedure: LEFT HEART CATH AND CORONARY ANGIOGRAPHY;  Surgeon: Martinique, Peter M, MD;  Location: [place] [facility];  Service: Cardiovascular;  Laterality: N/A;   LUMBAR WOUND DEBRIDEMENT N/A 02/23/2014   Procedure: LUMBAR WOUND DEBRIDEMENT;  Surgeon: Faythe Ghee, MD;  Location: [facility];  Service: Neurosurgery;  Laterality: N/A;  exploration lumbar wound. repair of dural defect.   MENISECTOMY Right    open medial    NEPHROLITHOTOMY     PERIPHERAL VASCULAR CATHETERIZATION N/A 01/30/2015   Procedure: IVC Filter Insertion;  Surgeon: Serafina Mitchell, MD;  Location: [place] [facility];  Service: Cardiovascular;  Laterality: N/A;   PERIPHERAL VASCULAR CATHETERIZATION N/A 04/24/2015   Procedure: IVC Filter Removal;  Surgeon: Serafina Mitchell, MD;  Location: [place] [facility];  Service: Cardiovascular;  Laterality: N/A;   PERIPHERAL VASCULAR CATHETERIZATION N/A 08/14/2015   Procedure: IVC Filter Removal;  Surgeon: Serafina Mitchell, MD;   Location: [place] [facility];  Service: Cardiovascular;  Laterality: N/A;   SHOULDER ARTHROSCOPY WITH OPEN ROTATOR CUFF REPAIR Left 11/10/2019   Procedure: SHOULDER ARTHROSCOPY WITH OPEN ROTATOR CUFF REPAIR;  Surgeon: Carole Civil, MD;  Location: [facility];  Service: Orthopedics;  Laterality: Left;  pt tested + on 2/23, does not need Covid test < 90 days   SHOULDER ARTHROSCOPY WITH ROTATOR CUFF REPAIR AND OPEN BICEPS TENODESIS Left 02/15/2019   Procedure: SHOULDER ARTHROSCOPY WITH open ROTATOR CUFF REPAIR AND OPEN BICEPS TENODESIS;  Surgeon: Carole Civil, MD;  Location[facility];  Service: Orthopedics;  Laterality: Left;   SPINE SURGERY  2015   Dr Hal Neer Fusion    SPINE SURGERY  1999   discectomy   TOTAL KNEE ARTHROPLASTY Right 02/02/2015   Procedure:  RIGHT TOTAL KNEE  ARTHROPLASTY;  Surgeon: Carole Civil, MD;  Location[facility];  Service: Orthopedics;  Laterality: Right;  LM with pt's daughter of new arrival time (10:45)     TOTAL KNEE ARTHROPLASTY Left 07/24/2016   Procedure: TOTAL KNEE ARTHROPLASTY;  Surgeon: Carole Civil, MD;  Location[facility];  Service: Orthopedics;  Laterality: Left;   TOTAL SHOULDER ARTHROPLASTY Left 11/08/2020   Procedure: TOTAL SHOULDER ARTHROPLASTY;  Surgeon: Mordecai Rasmussen, MD;  Location: [facility];  Service: Orthopedics;  Laterality: Left;  Left shoulder Reverse Arthroplasty    MEDICATIONS:  Acetaminophen 500 MG capsule   amLODipine (NORVASC) 5 MG tablet   aspirin 81 MG EC tablet   docusate sodium (COLACE) 842 MG capsule   folic acid (FOLVITE) 1 MG tablet   HYDROcodone-acetaminophen (NORCO) 7.5-325 MG tablet   HYDROcodone-acetaminophen (NORCO/VICODIN) 5-325 MG tablet   ibuprofen (ADVIL) 800 MG tablet   inFLIXimab-abda (RENFLEXIS IV)   loratadine (CLARITIN) 10 MG tablet   metFORMIN (GLUCOPHAGE) 500 MG tablet   methotrexate 50 MG/2ML injection   ondansetron (ZOFRAN-ODT) 8 MG disintegrating tablet   predniSONE (DELTASONE) 5 MG tablet   tamsulosin  (FLOMAX) 0.4 MG CAPS capsule   No current facility-administered medications for this encounter.    Konrad Felix Ward, PA-C [facility] Pre-Surgical Testing [phone]

## 2021-09-09 ENCOUNTER — Other Ambulatory Visit: Payer: Self-pay

## 2021-09-09 ENCOUNTER — Encounter (HOSPITAL_COMMUNITY)
Admission: RE | Admit: 2021-09-09 | Discharge: 2021-09-09 | Disposition: A | Discharge status: Home / Self Care | Payer: Medicare HMO | Source: Ambulatory Visit | Attending: Orthopaedic Surgery | Admitting: Orthopaedic Surgery

## 2021-09-09 DIAGNOSIS — Z01812 Encounter for preprocedural laboratory examination: Secondary | ICD-10-CM | POA: Insufficient documentation

## 2021-09-09 DIAGNOSIS — Z20822 Contact with and (suspected) exposure to covid-19: Secondary | ICD-10-CM | POA: Insufficient documentation

## 2021-09-09 DIAGNOSIS — Z01818 Encounter for other preprocedural examination: Secondary | ICD-10-CM

## 2021-09-09 LAB — SARS CORONAVIRUS 2 (TAT 6-24 HRS): SARS Coronavirus 2: NEGATIVE

## 2021-09-10 NOTE — H&P (Addendum)
PREOPERATIVE H&P  Chief Complaint: left shoulder failed hardware  HPI: Willie Howe is a 61 y.o. male who presents for preoperative history and physical prior to scheduled surgery, Procedure(s): REVISION TOTAL SHOULDER TO [place].   Patient has a past medical history significant for BPPH, DVT/PE, Graves disease, HTN, RA on Methotrexate, sleep apnea not on CPAP, HTN, Type 2 diabetes, chronic pain on Norco 7.5 mg every 8 hours.  The patient who had a failed cuff repair and then went on to have a reverse total shoulder arthroplasty. He did reasonably, he thinks, although he still had some pain for the first few months. However, he has had worsening pain over the past 3-4 months. He had a workup done for infection which was negative. There was a sign on X-ray of a possible humeral stem loosening. The patient now said he can  not really use the arm at all. He is very bothered by the shoulder.  He is in a sling.   His symptoms are rated as moderate to severe, and have been worsening.  This is significantly impairing activities of daily living.    Please see clinic note for further details on this patient's care.    He has elected for surgical management.   Past Medical History:  Diagnosis Date   BPPV (benign paroxysmal positional vertigo)    DVT (deep venous thrombosis) (HCC)    Graves disease 1996   Graves disease    hx of   History of kidney stones    History of pulmonary embolism    Hypertension    Obesity    Rheumatoid arthritis ([place])    Sleep apnea    Intolerant of CPAP   Type 2 diabetes mellitus ([place])    Past Surgical History:  Procedure Laterality Date   BACK SURGERY  2015   COLONOSCOPY     EXAM UNDER ANESTHESIA WITH MANIPULATION OF KNEE Right 04/18/2015   Procedure: MANIPULATION OF RIGHT KNEE UNDER ANESTHESIA;  Surgeon: Carole Civil, MD;  Location: [facility];  Service: Orthopedics;  Laterality: Right;   IVC Filter     removed December 2016    KIDNEY STONE SURGERY  2010   KNEE ARTHROSCOPY Right 2006   meniscus    KNEE ARTHROSCOPY WITH MEDIAL MENISECTOMY Left 04/24/2016   Procedure: LEFT KNEE ARTHROSCOPY WITH PARTIAL MEDIAL MENISECTOMY;  Surgeon: Carole Civil, MD;  Location: [facility];  Service: Orthopedics;  Laterality: Left;   KNEE SURGERY     x 2   KNEE SURGERY Left 1976   ?ligament repair    LEFT HEART CATH AND CORONARY ANGIOGRAPHY N/A 11/29/2019   Procedure: LEFT HEART CATH AND CORONARY ANGIOGRAPHY;  Surgeon: Martinique, Peter M, MD;  Location: [facility];  Service: Cardiovascular;  Laterality: N/A;   LUMBAR WOUND DEBRIDEMENT N/A 02/23/2014   Procedure: LUMBAR WOUND DEBRIDEMENT;  Surgeon: Faythe Ghee, MD;  Location: [facility];  Service: Neurosurgery;  Laterality: N/A;  exploration lumbar wound. repair of dural defect.   MENISECTOMY Right    open medial    NEPHROLITHOTOMY     PERIPHERAL VASCULAR CATHETERIZATION N/A 01/30/2015   Procedure: IVC Filter Insertion;  Surgeon: Serafina Mitchell, MD;  Location: [facility];  Service: Cardiovascular;  Laterality: N/A;   PERIPHERAL VASCULAR CATHETERIZATION N/A 04/24/2015   Procedure: IVC Filter Removal;  Surgeon: Serafina Mitchell, MD;  Location: [facility];  Service: Cardiovascular;  Laterality: N/A;   PERIPHERAL VASCULAR CATHETERIZATION  N/A 08/14/2015   Procedure: IVC Filter Removal;  Surgeon: Serafina Mitchell, MD;  Location: [facility];  Service: Cardiovascular;  Laterality: N/A;   SHOULDER ARTHROSCOPY WITH OPEN ROTATOR CUFF REPAIR Left 11/10/2019   Procedure: SHOULDER ARTHROSCOPY WITH OPEN ROTATOR CUFF REPAIR;  Surgeon: Carole Civil, MD;  Location: [facility];  Service: Orthopedics;  Laterality: Left;  pt tested + on 2/23, does not need Covid test < 90 days   SHOULDER ARTHROSCOPY WITH ROTATOR CUFF REPAIR AND OPEN BICEPS TENODESIS Left 02/15/2019   Procedure: SHOULDER ARTHROSCOPY WITH open ROTATOR CUFF REPAIR AND OPEN BICEPS TENODESIS;  Surgeon: Carole Civil,  MD;  Location: [facility];  Service: Orthopedics;  Laterality: Left;   SPINE SURGERY  2015   Dr Hal Neer Fusion    SPINE SURGERY  1999   discectomy   TOTAL KNEE ARTHROPLASTY Right 02/02/2015   Procedure:  RIGHT TOTAL KNEE ARTHROPLASTY;  Surgeon: Carole Civil, MD;  Location: [facility];  Service: Orthopedics;  Laterality: Right;  LM with pt's daughter of new arrival time (10:45)     TOTAL KNEE ARTHROPLASTY Left 07/24/2016   Procedure: TOTAL KNEE ARTHROPLASTY;  Surgeon: Carole Civil, MD;  Location: [facility];  Service: Orthopedics;  Laterality: Left;   TOTAL SHOULDER ARTHROPLASTY Left 11/08/2020   Procedure: TOTAL SHOULDER ARTHROPLASTY;  Surgeon: Mordecai Rasmussen, MD;  Location: [facility];  Service: Orthopedics;  Laterality: Left;  Left shoulder Reverse Arthroplasty   Social History   Socioeconomic History   Marital status: Married    Spouse name: Not on file   Number of children: 4   Years of education: College   Highest education level: Not on file  Occupational History   Occupation: N/A  Tobacco Use   Smoking status: Former    Packs/day: 1.50    Years: 20.00    Pack years: 30.00    Types: Cigarettes    Quit date: 02/03/1995    Years since quitting: 26.6   Smokeless tobacco: Never  Vaping Use   Vaping Use: Never used  Substance and Sexual Activity   Alcohol use: No    Alcohol/week: 0.0 standard drinks   Drug use: No   Sexual activity: Yes  Other Topics Concern   Not on file  Social History Narrative   Drinks about 1 cup of coffee a day    Social Determinants of Health   Financial Resource Strain: Not on file  Food Insecurity: Not on file  Transportation Needs: Not on file  Physical Activity: Not on file  Stress: Not on file  Social Connections: Not on file   Family History  Problem Relation Age of Onset   Deep vein thrombosis Mother    Hypertension Mother    Breast cancer Mother    Hypertension Father    Prostate cancer Father    CAD Sister    Allergies  Allergen  Reactions   Lisinopril Cough   Statins Other (See Comments)    Muscle pain   Prior to Admission medications   Medication Sig Start Date End Date Taking? Authorizing Provider  Acetaminophen 500 MG capsule Take 1,000 mg by mouth every 6 (six) hours as needed for pain.   Yes [provider]  amLODipine (NORVASC) 5 MG tablet Take 1 tablet (5 mg total) by mouth daily. 08/23/21  Yes Satira Sark, MD  aspirin 81 MG EC tablet Take 81 mg by mouth daily.   Yes [provider]  docusate sodium (COLACE) 100 MG  capsule Take 200 mg by mouth every other day.   Yes [provider]  folic acid (FOLVITE) 1 MG tablet Take 1 mg by mouth daily.   Yes [provider]  HYDROcodone-acetaminophen (NORCO) 7.5-325 MG tablet Take 1 tablet by mouth every 8 (eight) hours as needed for pain. 07/13/21  Yes [provider]  ibuprofen (ADVIL) 800 MG tablet TAKE 1 TABLET (800 MG TOTAL) BY MOUTH EVERY 8 (EIGHT) HOURS AS NEEDED FOR MODERATE PAIN. 01/07/21  Yes Mordecai Rasmussen, MD  inFLIXimab-abda (RENFLEXIS IV) Inject 1 Dose into the vein every 6 (six) weeks.   Yes [provider]  loratadine (CLARITIN) 10 MG tablet Take 10 mg by mouth daily.   Yes [provider]  metFORMIN (GLUCOPHAGE) 500 MG tablet Take 500 mg by mouth daily with breakfast.   Yes [provider]  methotrexate 50 MG/2ML injection Inject 20 mg into the muscle every Friday. 01/05/19  Yes [provider]  ondansetron (ZOFRAN-ODT) 8 MG disintegrating tablet Take 8 mg by mouth every 8 (eight) hours as needed. 07/25/21  Yes [provider]  predniSONE (DELTASONE) 5 MG tablet Take 5 mg by mouth daily.   Yes [provider]  tamsulosin (FLOMAX) 0.4 MG CAPS capsule Take 0.4 mg by mouth at bedtime.    Yes [provider]  HYDROcodone-acetaminophen (NORCO/VICODIN) 5-325 MG tablet Take 1 tablet by mouth every 12 (twelve) hours as needed for severe pain. No more than 2  per day Patient not taking: Reported on 09/03/2021 02/21/21   Mordecai Rasmussen, MD    ROS: All other systems have been reviewed and were otherwise negative with the exception of those mentioned in the HPI and as above.  Physical Exam: General: Alert, no acute distress Cardiovascular: No pedal edema Respiratory: No cyanosis, no use of accessory musculature GI: No organomegaly, abdomen is soft and non-tender Skin: No lesions in the area of chief complaint Neurologic: Sensation intact distally Psychiatric: Patient is competent for consent with normal mood and affect Lymphatic: No axillary or cervical lymphadenopathy  MUSCULOSKELETAL:  The left shoulder range of motion actively to about 40, passive to 80. External rotation to 0. Internal rotation to the front pocket. Axillary nerve appears to be firing normally.   Imaging: The X-rays demonstrate the possible subsidence of a humeral stem, although the CT does not demonstrate any obvious lucency. There is an Arthrex well-fixed glenoid implant in place.   Assessment: left shoulder failed hardware  Plan: Plan for Procedure(s): REVISION TOTAL SHOULDER TO REVERSE TOTAL SHOULDER  The patient has essentially failed the surgery at this point. He is almost a year out from his surgery and he does not seem to be making any progress and in fact he is getting worse. We think this is either infectious loosening versus aseptic loosening of the implants. We will work on a revision to a long stem component. We will try and retain his glenoid base plate but we will replace the glenosphere.  There is a possibility of having to remove everything and do an antibiotic spacer. He understands the risks and benefits of surgery including continued pain.  The risks benefits and alternatives were discussed with the patient including but not limited to the risks of nonoperative treatment, versus surgical intervention including infection, bleeding, nerve injury,  blood  clots, cardiopulmonary complications, morbidity, mortality, among others, and they were willing to proceed.   We additionally specifically discussed risks of axillary nerve injury, infection, periprosthetic fracture, continued pain and  longevity of implants prior to beginning procedure.    Patient will be admitted for inpatient treatment for surgery, pain control, OT, prophylactic antibiotics, VTE prophylaxis, and discharge planning. The patient is planning to be discharged home with outpatient PT.   The patient acknowledged the explanation, agreed to proceed with the plan and consent was signed.   He received operative clearance from his PCP, Dr. Terrill Mohr, his cardiologist, Dr. Rozann Lesches, and his rheumatologist, Dr. Gavin Pound  Operative Plan: Left revision to reverse total shoulder arthroplasty Discharge Medications: Standard DVT Prophylaxis: Xarelto Physical Therapy: Outpatient PT Special Discharge needs: Sling. [place], PA-C  09/10/2021 4:21 PM

## 2021-09-10 NOTE — Anesthesia Preprocedure Evaluation (Addendum)
Anesthesia Evaluation  Patient identified by MRN, date of birth, ID band Patient awake    Reviewed: Allergy & Precautions, NPO status , Patient's Chart, lab work & pertinent test results  Airway Mallampati: III  TM Distance: >3 FB Neck ROM: Full    Dental no notable dental hx.    Pulmonary sleep apnea , former smoker,    Pulmonary exam normal breath sounds clear to auscultation       Cardiovascular hypertension, Pt. on medications + DVT   Rhythm:Regular Rate:Bradycardia     Neuro/Psych negative neurological ROS  negative psych ROS   GI/Hepatic negative GI ROS, Neg liver ROS,   Endo/Other  diabetes, Type 2  Renal/GU negative Renal ROS  negative genitourinary   Musculoskeletal  (+) Arthritis , Rheumatoid disorders,    Abdominal   Peds negative pediatric ROS (+)  Hematology negative hematology ROS (+)   Anesthesia Other Findings   Reproductive/Obstetrics negative OB ROS                            Anesthesia Physical Anesthesia Plan  ASA: 3  Anesthesia Plan: General   Post-op Pain Management: Regional block   Induction: Intravenous  PONV Risk Score and Plan: 2 and Ondansetron, Dexamethasone and Treatment may vary due to age or medical condition  Airway Management Planned: Oral ETT  Additional Equipment:   Intra-op Plan:   Post-operative Plan: Extubation in OR  Informed Consent: I have reviewed the patients History and Physical, chart, labs and discussed the procedure including the risks, benefits and alternatives for the proposed anesthesia with the patient or authorized representative who has indicated his/her understanding and acceptance.     Dental advisory given  Plan Discussed with: CRNA and Surgeon  Anesthesia Plan Comments:        Anesthesia Quick Evaluation

## 2021-09-11 ENCOUNTER — Inpatient Hospital Stay (HOSPITAL_COMMUNITY): Payer: Medicare HMO | Admitting: Physician Assistant

## 2021-09-11 ENCOUNTER — Encounter (HOSPITAL_COMMUNITY)
Admission: RE | Disposition: A | Discharge status: Home / Self Care | Payer: Self-pay | Source: Ambulatory Visit | Attending: Orthopaedic Surgery

## 2021-09-11 ENCOUNTER — Inpatient Hospital Stay (HOSPITAL_COMMUNITY): Payer: Medicare HMO | Admitting: Certified Registered Nurse Anesthetist

## 2021-09-11 ENCOUNTER — Inpatient Hospital Stay: Payer: Self-pay

## 2021-09-11 ENCOUNTER — Observation Stay (HOSPITAL_COMMUNITY): Payer: Medicare HMO

## 2021-09-11 ENCOUNTER — Inpatient Hospital Stay (HOSPITAL_COMMUNITY)
Admission: RE | Admit: 2021-09-11 | Discharge: 2021-09-12 | DRG: 483 | Disposition: A | Discharge status: Home / Self Care | Payer: Medicare HMO | Source: Ambulatory Visit | Attending: Orthopaedic Surgery | Admitting: Orthopaedic Surgery

## 2021-09-11 ENCOUNTER — Other Ambulatory Visit: Payer: Self-pay

## 2021-09-11 ENCOUNTER — Encounter (HOSPITAL_COMMUNITY): Payer: Self-pay | Admitting: Orthopaedic Surgery

## 2021-09-11 DIAGNOSIS — Z8249 Family history of ischemic heart disease and other diseases of the circulatory system: Secondary | ICD-10-CM

## 2021-09-11 DIAGNOSIS — Z86711 Personal history of pulmonary embolism: Secondary | ICD-10-CM | POA: Diagnosis not present

## 2021-09-11 DIAGNOSIS — Z87891 Personal history of nicotine dependence: Secondary | ICD-10-CM

## 2021-09-11 DIAGNOSIS — T8450XA Infection and inflammatory reaction due to unspecified internal joint prosthesis, initial encounter: Secondary | ICD-10-CM | POA: Diagnosis not present

## 2021-09-11 DIAGNOSIS — Z7982 Long term (current) use of aspirin: Secondary | ICD-10-CM | POA: Diagnosis not present

## 2021-09-11 DIAGNOSIS — Z7952 Long term (current) use of systemic steroids: Secondary | ICD-10-CM | POA: Diagnosis not present

## 2021-09-11 DIAGNOSIS — G8918 Other acute postprocedural pain: Secondary | ICD-10-CM | POA: Diagnosis not present

## 2021-09-11 DIAGNOSIS — Z888 Allergy status to other drugs, medicaments and biological substances status: Secondary | ICD-10-CM

## 2021-09-11 DIAGNOSIS — T84038A Mechanical loosening of other internal prosthetic joint, initial encounter: Principal | ICD-10-CM | POA: Diagnosis present

## 2021-09-11 DIAGNOSIS — Z86718 Personal history of other venous thrombosis and embolism: Secondary | ICD-10-CM

## 2021-09-11 DIAGNOSIS — M069 Rheumatoid arthritis, unspecified: Secondary | ICD-10-CM | POA: Diagnosis not present

## 2021-09-11 DIAGNOSIS — Y831 Surgical operation with implant of artificial internal device as the cause of abnormal reaction of the patient, or of later complication, without mention of misadventure at the time of the procedure: Secondary | ICD-10-CM | POA: Diagnosis present

## 2021-09-11 DIAGNOSIS — H8110 Benign paroxysmal vertigo, unspecified ear: Secondary | ICD-10-CM | POA: Diagnosis present

## 2021-09-11 DIAGNOSIS — Z01818 Encounter for other preprocedural examination: Secondary | ICD-10-CM

## 2021-09-11 DIAGNOSIS — I10 Essential (primary) hypertension: Secondary | ICD-10-CM | POA: Diagnosis present

## 2021-09-11 DIAGNOSIS — M19012 Primary osteoarthritis, left shoulder: Secondary | ICD-10-CM | POA: Diagnosis not present

## 2021-09-11 DIAGNOSIS — Z803 Family history of malignant neoplasm of breast: Secondary | ICD-10-CM

## 2021-09-11 DIAGNOSIS — T847XXA Infection and inflammatory reaction due to other internal orthopedic prosthetic devices, implants and grafts, initial encounter: Secondary | ICD-10-CM | POA: Diagnosis not present

## 2021-09-11 DIAGNOSIS — Z8042 Family history of malignant neoplasm of prostate: Secondary | ICD-10-CM | POA: Diagnosis not present

## 2021-09-11 DIAGNOSIS — Z96653 Presence of artificial knee joint, bilateral: Secondary | ICD-10-CM | POA: Diagnosis present

## 2021-09-11 DIAGNOSIS — Z20822 Contact with and (suspected) exposure to covid-19: Secondary | ICD-10-CM | POA: Diagnosis not present

## 2021-09-11 DIAGNOSIS — T8459XA Infection and inflammatory reaction due to other internal joint prosthesis, initial encounter: Secondary | ICD-10-CM | POA: Diagnosis present

## 2021-09-11 DIAGNOSIS — T8459XD Infection and inflammatory reaction due to other internal joint prosthesis, subsequent encounter: Secondary | ICD-10-CM | POA: Diagnosis not present

## 2021-09-11 DIAGNOSIS — Z7984 Long term (current) use of oral hypoglycemic drugs: Secondary | ICD-10-CM | POA: Diagnosis not present

## 2021-09-11 DIAGNOSIS — G4730 Sleep apnea, unspecified: Secondary | ICD-10-CM | POA: Diagnosis present

## 2021-09-11 DIAGNOSIS — T84018A Broken internal joint prosthesis, other site, initial encounter: Secondary | ICD-10-CM | POA: Diagnosis not present

## 2021-09-11 DIAGNOSIS — Z96612 Presence of left artificial shoulder joint: Secondary | ICD-10-CM | POA: Diagnosis not present

## 2021-09-11 DIAGNOSIS — Z09 Encounter for follow-up examination after completed treatment for conditions other than malignant neoplasm: Secondary | ICD-10-CM

## 2021-09-11 DIAGNOSIS — Z79631 Long term (current) use of antimetabolite agent: Secondary | ICD-10-CM

## 2021-09-11 DIAGNOSIS — E119 Type 2 diabetes mellitus without complications: Secondary | ICD-10-CM | POA: Diagnosis not present

## 2021-09-11 DIAGNOSIS — Z79899 Other long term (current) drug therapy: Secondary | ICD-10-CM

## 2021-09-11 DIAGNOSIS — Z96642 Presence of left artificial hip joint: Secondary | ICD-10-CM | POA: Diagnosis not present

## 2021-09-11 DIAGNOSIS — Z471 Aftercare following joint replacement surgery: Secondary | ICD-10-CM | POA: Diagnosis not present

## 2021-09-11 HISTORY — PX: REVISION TOTAL SHOULDER TO REVERSE TOTAL SHOULDER: SHX6313

## 2021-09-11 LAB — CBC
HCT: 35.2 % — ABNORMAL LOW (ref 39.0–52.0)
Hemoglobin: 11.1 g/dL — ABNORMAL LOW (ref 13.0–17.0)
MCH: 28.7 pg (ref 26.0–34.0)
MCHC: 31.5 g/dL (ref 30.0–36.0)
MCV: 91 fL (ref 80.0–100.0)
Platelets: 244 10*3/uL (ref 150–400)
RBC: 3.87 MIL/uL — ABNORMAL LOW (ref 4.22–5.81)
RDW: 17.9 % — ABNORMAL HIGH (ref 11.5–15.5)
WBC: 13.2 10*3/uL — ABNORMAL HIGH (ref 4.0–10.5)
nRBC: 0 % (ref 0.0–0.2)

## 2021-09-11 LAB — C-REACTIVE PROTEIN: CRP: 0.9 mg/dL (ref <1.0)

## 2021-09-11 LAB — SURGICAL PCR SCREEN
MRSA, PCR: NEGATIVE
Staphylococcus aureus: NEGATIVE

## 2021-09-11 LAB — SEDIMENTATION RATE: Sed Rate: 12 mm/hr (ref 0–16)

## 2021-09-11 LAB — GLUCOSE, CAPILLARY
Glucose-Capillary: 125 mg/dL — ABNORMAL HIGH (ref 70–99)
Glucose-Capillary: 127 mg/dL — ABNORMAL HIGH (ref 70–99)
Glucose-Capillary: 166 mg/dL — ABNORMAL HIGH (ref 70–99)

## 2021-09-11 SURGERY — REVISION, REVERSE TOTAL ARTHROPLASTY, SHOULDER
Anesthesia: General | Site: Shoulder | Laterality: Left

## 2021-09-11 MED ORDER — 0.9 % SODIUM CHLORIDE (POUR BTL) OPTIME
TOPICAL | Status: DC | PRN
  Administered 2021-09-11: 08:00:00 1000 mL

## 2021-09-11 MED ORDER — ZOLPIDEM TARTRATE 5 MG PO TABS: 5.0000 mg | ORAL_TABLET | Freq: Every evening | ORAL | Status: DC | PRN

## 2021-09-11 MED ORDER — METFORMIN HCL 500 MG PO TABS
500.0000 mg | ORAL_TABLET | Freq: Every day | ORAL | Status: DC
  Administered 2021-09-12: 500 mg via ORAL
  Filled 2021-09-11: qty 1

## 2021-09-11 MED ORDER — LORATADINE 10 MG PO TABS
10.0000 mg | ORAL_TABLET | Freq: Every day | ORAL | Status: DC
  Administered 2021-09-12: 10 mg via ORAL
  Filled 2021-09-11: qty 1

## 2021-09-11 MED ORDER — HYDROMORPHONE HCL 1 MG/ML IJ SOLN
0.2500 mg | INTRAMUSCULAR | Status: DC | PRN
  Administered 2021-09-11: 11:00:00 0.5 mg via INTRAVENOUS

## 2021-09-11 MED ORDER — BUPIVACAINE HCL (PF) 0.5 % IJ SOLN
INTRAMUSCULAR | Status: DC | PRN
  Administered 2021-09-11: 20 mL via PERINEURAL

## 2021-09-11 MED ORDER — POLYETHYLENE GLYCOL 3350 17 G PO PACK: 17.0000 g | PACK | Freq: Every day | ORAL | Status: DC | PRN

## 2021-09-11 MED ORDER — OXYCODONE HCL 5 MG PO TABS
10.0000 mg | ORAL_TABLET | ORAL | Status: DC | PRN
  Administered 2021-09-11 – 2021-09-12 (×6): 10 mg via ORAL
  Filled 2021-09-11 (×6): qty 2

## 2021-09-11 MED ORDER — METOCLOPRAMIDE HCL 5 MG PO TABS: 5.0000 mg | ORAL_TABLET | Freq: Three times a day (TID) | ORAL | Status: DC | PRN

## 2021-09-11 MED ORDER — IRRISEPT - 450ML BOTTLE WITH 0.05% CHG IN STERILE WATER, USP 99.95% OPTIME
TOPICAL | Status: DC | PRN
  Administered 2021-09-11: 08:00:00 450 mL

## 2021-09-11 MED ORDER — ONDANSETRON HCL 4 MG/2ML IJ SOLN: 4.0000 mg | Freq: Four times a day (QID) | INTRAMUSCULAR | Status: DC | PRN

## 2021-09-11 MED ORDER — VANCOMYCIN HCL 1000 MG IV SOLR
INTRAVENOUS | Status: AC
  Filled 2021-09-11: qty 20

## 2021-09-11 MED ORDER — METHOCARBAMOL 500 MG IVPB - SIMPLE MED
INTRAVENOUS | Status: AC
  Administered 2021-09-11: 11:00:00 500 mg via INTRAVENOUS
  Filled 2021-09-11: qty 50

## 2021-09-11 MED ORDER — ONDANSETRON HCL 4 MG/2ML IJ SOLN
INTRAMUSCULAR | Status: AC
  Filled 2021-09-11: qty 2

## 2021-09-11 MED ORDER — MIDAZOLAM HCL 5 MG/5ML IJ SOLN
INTRAMUSCULAR | Status: DC | PRN
  Administered 2021-09-11: 2 mg via INTRAVENOUS

## 2021-09-11 MED ORDER — GABAPENTIN 100 MG PO CAPS
100.0000 mg | ORAL_CAPSULE | Freq: Three times a day (TID) | ORAL | Status: DC
  Administered 2021-09-11 – 2021-09-12 (×4): 100 mg via ORAL
  Filled 2021-09-11 (×4): qty 1

## 2021-09-11 MED ORDER — VANCOMYCIN HCL IN DEXTROSE 1-5 GM/200ML-% IV SOLN
1000.0000 mg | Freq: Two times a day (BID) | INTRAVENOUS | Status: AC
  Administered 2021-09-11 – 2021-09-12 (×2): 1000 mg via INTRAVENOUS
  Filled 2021-09-11 (×2): qty 200

## 2021-09-11 MED ORDER — ACETAMINOPHEN 500 MG PO TABS
1000.0000 mg | ORAL_TABLET | Freq: Four times a day (QID) | ORAL | Status: AC
  Administered 2021-09-11 – 2021-09-12 (×4): 1000 mg via ORAL
  Filled 2021-09-11 (×4): qty 2

## 2021-09-11 MED ORDER — CEFAZOLIN SODIUM-DEXTROSE 2-4 GM/100ML-% IV SOLN
2.0000 g | INTRAVENOUS | Status: AC
  Administered 2021-09-11: 08:00:00 2 g via INTRAVENOUS
  Filled 2021-09-11: qty 100

## 2021-09-11 MED ORDER — GABAPENTIN 300 MG PO CAPS
300.0000 mg | ORAL_CAPSULE | Freq: Once | ORAL | Status: AC
  Administered 2021-09-11: 06:00:00 300 mg via ORAL
  Filled 2021-09-11: qty 1

## 2021-09-11 MED ORDER — CELECOXIB 200 MG PO CAPS
200.0000 mg | ORAL_CAPSULE | Freq: Two times a day (BID) | ORAL | Status: DC
  Administered 2021-09-11 – 2021-09-12 (×3): 200 mg via ORAL
  Filled 2021-09-11 (×3): qty 1

## 2021-09-11 MED ORDER — DIPHENHYDRAMINE HCL 12.5 MG/5ML PO ELIX: 12.5000 mg | ORAL_SOLUTION | ORAL | Status: DC | PRN

## 2021-09-11 MED ORDER — MENTHOL 3 MG MT LOZG: 1.0000 | LOZENGE | OROMUCOSAL | Status: DC | PRN

## 2021-09-11 MED ORDER — METHOCARBAMOL 500 MG PO TABS
500.0000 mg | ORAL_TABLET | Freq: Four times a day (QID) | ORAL | Status: DC | PRN
  Administered 2021-09-11: 18:00:00 500 mg via ORAL
  Filled 2021-09-11: qty 1

## 2021-09-11 MED ORDER — PREDNISONE 5 MG PO TABS: 5.0000 mg | ORAL_TABLET | Freq: Every day | ORAL | Status: DC

## 2021-09-11 MED ORDER — PHENYLEPHRINE HCL-NACL 20-0.9 MG/250ML-% IV SOLN
INTRAVENOUS | Status: AC
  Filled 2021-09-11: qty 500

## 2021-09-11 MED ORDER — VANCOMYCIN HCL 1 G IV SOLR
INTRAVENOUS | Status: DC | PRN
  Administered 2021-09-11: 1000 mg

## 2021-09-11 MED ORDER — OXYCODONE HCL 5 MG PO TABS: 5.0000 mg | ORAL_TABLET | ORAL | Status: DC | PRN

## 2021-09-11 MED ORDER — ROCURONIUM BROMIDE 10 MG/ML (PF) SYRINGE
PREFILLED_SYRINGE | INTRAVENOUS | Status: DC | PRN
  Administered 2021-09-11: 80 mg via INTRAVENOUS
  Administered 2021-09-11: 20 mg via INTRAVENOUS

## 2021-09-11 MED ORDER — DOCUSATE SODIUM 100 MG PO CAPS
100.0000 mg | ORAL_CAPSULE | Freq: Two times a day (BID) | ORAL | Status: DC
  Administered 2021-09-11 – 2021-09-12 (×2): 100 mg via ORAL
  Filled 2021-09-11 (×2): qty 1

## 2021-09-11 MED ORDER — ONDANSETRON HCL 4 MG/2ML IJ SOLN: 4.0000 mg | Freq: Once | INTRAMUSCULAR | Status: DC | PRN

## 2021-09-11 MED ORDER — OXYCODONE HCL 5 MG/5ML PO SOLN: 5.0000 mg | Freq: Once | ORAL | Status: DC | PRN

## 2021-09-11 MED ORDER — ENOXAPARIN SODIUM 40 MG/0.4ML IJ SOSY
40.0000 mg | PREFILLED_SYRINGE | INTRAMUSCULAR | Status: DC
  Administered 2021-09-12: 40 mg via SUBCUTANEOUS
  Filled 2021-09-11: qty 0.4

## 2021-09-11 MED ORDER — PHENYLEPHRINE 40 MCG/ML (10ML) SYRINGE FOR IV PUSH (FOR BLOOD PRESSURE SUPPORT)
PREFILLED_SYRINGE | INTRAVENOUS | Status: AC
  Filled 2021-09-11: qty 20

## 2021-09-11 MED ORDER — PHENYLEPHRINE HCL-NACL 20-0.9 MG/250ML-% IV SOLN
INTRAVENOUS | Status: DC | PRN
Start: 2021-09-11 — End: 2021-09-11
  Administered 2021-09-11: 40 ug/min via INTRAVENOUS

## 2021-09-11 MED ORDER — OXYCODONE HCL 5 MG PO TABS: 5.0000 mg | ORAL_TABLET | Freq: Once | ORAL | Status: DC | PRN

## 2021-09-11 MED ORDER — HYDROMORPHONE HCL 1 MG/ML IJ SOLN
0.5000 mg | INTRAMUSCULAR | Status: DC | PRN
  Administered 2021-09-12: 0.5 mg via INTRAVENOUS
  Filled 2021-09-11: qty 1

## 2021-09-11 MED ORDER — EPHEDRINE SULFATE-NACL 50-0.9 MG/10ML-% IV SOSY
PREFILLED_SYRINGE | INTRAVENOUS | Status: DC | PRN
  Administered 2021-09-11: 10 mg via INTRAVENOUS

## 2021-09-11 MED ORDER — BUPIVACAINE LIPOSOME 1.3 % IJ SUSP
INTRAMUSCULAR | Status: DC | PRN
Start: 2021-09-11 — End: 2021-09-11
  Administered 2021-09-11: 10 mL

## 2021-09-11 MED ORDER — TAMSULOSIN HCL 0.4 MG PO CAPS
0.4000 mg | ORAL_CAPSULE | Freq: Every day | ORAL | Status: DC
  Administered 2021-09-11: 22:00:00 0.4 mg via ORAL
  Filled 2021-09-11: qty 1

## 2021-09-11 MED ORDER — INSULIN ASPART 100 UNIT/ML IJ SOLN
0.0000 [IU] | Freq: Three times a day (TID) | INTRAMUSCULAR | Status: DC
  Administered 2021-09-12: 2 [IU] via SUBCUTANEOUS

## 2021-09-11 MED ORDER — METHOCARBAMOL 500 MG IVPB - SIMPLE MED
500.0000 mg | Freq: Four times a day (QID) | INTRAVENOUS | Status: DC | PRN
  Filled 2021-09-11: qty 50

## 2021-09-11 MED ORDER — TOBRAMYCIN SULFATE 1.2 G IJ SOLR
INTRAMUSCULAR | Status: DC | PRN
Start: 2021-09-11 — End: 2021-09-11
  Administered 2021-09-11: 1.2 g

## 2021-09-11 MED ORDER — TRANEXAMIC ACID-NACL 1000-0.7 MG/100ML-% IV SOLN
1000.0000 mg | INTRAVENOUS | Status: AC
  Administered 2021-09-11: 08:00:00 1000 mg via INTRAVENOUS
  Filled 2021-09-11: qty 100

## 2021-09-11 MED ORDER — AMLODIPINE BESYLATE 5 MG PO TABS
5.0000 mg | ORAL_TABLET | Freq: Every day | ORAL | Status: DC
  Administered 2021-09-12: 5 mg via ORAL
  Filled 2021-09-11: qty 1

## 2021-09-11 MED ORDER — FENTANYL CITRATE (PF) 100 MCG/2ML IJ SOLN
INTRAMUSCULAR | Status: DC | PRN
  Administered 2021-09-11 (×3): 50 ug via INTRAVENOUS

## 2021-09-11 MED ORDER — SODIUM CHLORIDE 0.9 % IR SOLN
Status: DC | PRN
  Administered 2021-09-11: 1000 mL
  Administered 2021-09-11: 3000 mL

## 2021-09-11 MED ORDER — FENTANYL CITRATE (PF) 250 MCG/5ML IJ SOLN
INTRAMUSCULAR | Status: AC
  Filled 2021-09-11: qty 5

## 2021-09-11 MED ORDER — MIDAZOLAM HCL 2 MG/2ML IJ SOLN
INTRAMUSCULAR | Status: AC
  Filled 2021-09-11: qty 2

## 2021-09-11 MED ORDER — HYDROMORPHONE HCL 1 MG/ML IJ SOLN
INTRAMUSCULAR | Status: AC
  Administered 2021-09-11: 11:00:00 0.5 mg via INTRAVENOUS
  Filled 2021-09-11: qty 1

## 2021-09-11 MED ORDER — PHENYLEPHRINE 40 MCG/ML (10ML) SYRINGE FOR IV PUSH (FOR BLOOD PRESSURE SUPPORT)
PREFILLED_SYRINGE | INTRAVENOUS | Status: DC | PRN
  Administered 2021-09-11 (×2): 40 ug via INTRAVENOUS
  Administered 2021-09-11: 120 ug via INTRAVENOUS
  Administered 2021-09-11 (×2): 40 ug via INTRAVENOUS
  Administered 2021-09-11: 80 ug via INTRAVENOUS
  Administered 2021-09-11: 120 ug via INTRAVENOUS

## 2021-09-11 MED ORDER — ACETAMINOPHEN 500 MG PO TABS
1000.0000 mg | ORAL_TABLET | Freq: Once | ORAL | Status: AC
  Administered 2021-09-11: 06:00:00 1000 mg via ORAL
  Filled 2021-09-11: qty 2

## 2021-09-11 MED ORDER — SUGAMMADEX SODIUM 200 MG/2ML IV SOLN
INTRAVENOUS | Status: DC | PRN
  Administered 2021-09-11: 200 mg via INTRAVENOUS

## 2021-09-11 MED ORDER — SODIUM CHLORIDE 0.9 % IV SOLN
2.0000 g | INTRAVENOUS | Status: DC
  Administered 2021-09-11 – 2021-09-12 (×2): 2 g via INTRAVENOUS
  Filled 2021-09-11 (×2): qty 20

## 2021-09-11 MED ORDER — SODIUM CHLORIDE 0.9 % IR SOLN
Status: DC | PRN
  Administered 2021-09-11: 1000 mL

## 2021-09-11 MED ORDER — METOCLOPRAMIDE HCL 5 MG/ML IJ SOLN: 5.0000 mg | Freq: Three times a day (TID) | INTRAMUSCULAR | Status: DC | PRN

## 2021-09-11 MED ORDER — BISACODYL 10 MG RE SUPP: 10.0000 mg | Freq: Every day | RECTAL | Status: DC | PRN

## 2021-09-11 MED ORDER — PHENOL 1.4 % MT LIQD: 1.0000 | OROMUCOSAL | Status: DC | PRN

## 2021-09-11 MED ORDER — ONDANSETRON HCL 4 MG PO TABS: 4.0000 mg | ORAL_TABLET | Freq: Four times a day (QID) | ORAL | Status: DC | PRN

## 2021-09-11 MED ORDER — PROPOFOL 10 MG/ML IV BOLUS
INTRAVENOUS | Status: AC
  Filled 2021-09-11: qty 20

## 2021-09-11 MED ORDER — LACTATED RINGERS IV SOLN: INTRAVENOUS | Status: DC

## 2021-09-11 MED ORDER — INSULIN ASPART 100 UNIT/ML IJ SOLN: 0.0000 [IU] | Freq: Every day | INTRAMUSCULAR | Status: DC

## 2021-09-11 MED ORDER — TOBRAMYCIN SULFATE 1.2 G IJ SOLR
INTRAMUSCULAR | Status: AC
  Filled 2021-09-11: qty 1.2

## 2021-09-11 MED ORDER — LIDOCAINE 2% (20 MG/ML) 5 ML SYRINGE
INTRAMUSCULAR | Status: DC | PRN
  Administered 2021-09-11: 100 mg via INTRAVENOUS

## 2021-09-11 MED ORDER — STERILE WATER FOR IRRIGATION IR SOLN
Status: DC | PRN
  Administered 2021-09-11: 2000 mL

## 2021-09-11 MED ORDER — PROPOFOL 10 MG/ML IV BOLUS
INTRAVENOUS | Status: DC | PRN
  Administered 2021-09-11: 150 mg via INTRAVENOUS

## 2021-09-11 SURGICAL SUPPLY — 72 items
BAG COUNTER SPONGE SURGICOUNT (BAG) ×2 IMPLANT
BASEPLATE GLENOID RSA 3X25 0D (Shoulder) ×1 IMPLANT
BLADE SAW SAG 73X25 THK (BLADE) ×1
BLADE SAW SGTL 73X25 THK (BLADE) ×1 IMPLANT
BODY PROXIMAL PTC 13X132.5 (Joint) IMPLANT
CAP LOCKING COCR (Cap) ×1 IMPLANT
CHLORAPREP W/TINT 26 (MISCELLANEOUS) ×4 IMPLANT
CLSR STERI-STRIP ANTIMIC 1/2X4 (GAUZE/BANDAGES/DRESSINGS) ×2 IMPLANT
COOLER ICEMAN CLASSIC (MISCELLANEOUS) ×1 IMPLANT
COVER BACK TABLE 60X90IN (DRAPES) ×1 IMPLANT
COVER SURGICAL LIGHT HANDLE (MISCELLANEOUS) ×2 IMPLANT
DRAPE C-ARM 42X120 X-RAY (DRAPES) IMPLANT
DRAPE INCISE IOBAN 66X45 STRL (DRAPES) ×2 IMPLANT
DRAPE ORTHO SPLIT 77X108 STRL (DRAPES) ×4
DRAPE SHEET LG 3/4 BI-LAMINATE (DRAPES) ×5 IMPLANT
DRAPE SURG ORHT 6 SPLT 77X108 (DRAPES) ×2 IMPLANT
DRSG AQUACEL AG ADV 3.5X 6 (GAUZE/BANDAGES/DRESSINGS) ×2 IMPLANT
DRSG AQUACEL AG ADV 3.5X10 (GAUZE/BANDAGES/DRESSINGS) ×1 IMPLANT
ELECT BLADE TIP CTD 4 INCH (ELECTRODE) ×2 IMPLANT
ELECT PENCIL ROCKER SW 15FT (MISCELLANEOUS) ×1 IMPLANT
ELECT REM PT RETURN 15FT ADLT (MISCELLANEOUS) ×2 IMPLANT
FACESHIELD WRAPAROUND (MASK) ×2 IMPLANT
FACESHIELD WRAPAROUND OR TEAM (MASK) ×1 IMPLANT
GLENOSPHERE STANDARD 39 (Joint) ×2 IMPLANT
GLENOSPHERE STD 39 (Joint) IMPLANT
GLOVE SRG 8 PF TXTR STRL LF DI (GLOVE) ×1 IMPLANT
GLOVE SURG ENC MOIS LTX SZ6.5 (GLOVE) ×4 IMPLANT
GLOVE SURG NEOPR MICRO LF SZ8 (GLOVE) ×4 IMPLANT
GLOVE SURG UNDER POLY LF SZ6.5 (GLOVE) ×2 IMPLANT
GLOVE SURG UNDER POLY LF SZ8 (GLOVE) ×6
GOWN STRL REUS W/TWL LRG LVL3 (GOWN DISPOSABLE) ×4 IMPLANT
GOWN STRL REUS W/TWL XL LVL3 (GOWN DISPOSABLE) ×2 IMPLANT
GUIDEWIRE GLENOID 2.5X220 (WIRE) ×1 IMPLANT
HANDPIECE INTERPULSE COAX TIP (DISPOSABLE) ×2
INSERT REV KIT SHOULDER 6X39 (Screw) ×1 IMPLANT
KIT BASIN OR (CUSTOM PROCEDURE TRAY) ×2 IMPLANT
KIT STABILIZATION SHOULDER (MISCELLANEOUS) ×2 IMPLANT
KIT TURNOVER KIT A (KITS) ×1 IMPLANT
MANIFOLD NEPTUNE II (INSTRUMENTS) ×2 IMPLANT
NDL MAYO CATGUT SZ4 TPR NDL (NEEDLE) IMPLANT
NEEDLE MAYO CATGUT SZ4 (NEEDLE) IMPLANT
NS IRRIG 1000ML POUR BTL (IV SOLUTION) ×2 IMPLANT
PACK SHOULDER (CUSTOM PROCEDURE TRAY) ×2 IMPLANT
PAD COLD SHLDR WRAP-ON (PAD) IMPLANT
PROXIMAL BODY PTC 13X132.5 (Joint) ×2 IMPLANT
RESTRAINT HEAD UNIVERSAL NS (MISCELLANEOUS) ×2 IMPLANT
SCREW 5.0X18 (Screw) ×1 IMPLANT
SCREW 5.0X38 SMALL F/PERFORM (Screw) ×1 IMPLANT
SCREW BONE INTRNL SM 7 (Screw) ×1 IMPLANT
SCREW PERIPHERAL 5.0X34 (Screw) ×2 IMPLANT
SCREW REV AEQUALIS 20 (Screw) ×1 IMPLANT
SCREW SHLD ASSEMBLY AEQ 20 (Spacer) ×1 IMPLANT
SET HNDPC FAN SPRY TIP SCT (DISPOSABLE) ×1 IMPLANT
SLING ULTRA II L (ORTHOPEDIC SUPPLIES) IMPLANT
SLING ULTRA III MED (ORTHOPEDIC SUPPLIES) ×2 IMPLANT
SPONGE T-LAP 18X18 ~~LOC~~+RFID (SPONGE) ×2 IMPLANT
SPONGE T-LAP 4X18 ~~LOC~~+RFID (SPONGE) ×2 IMPLANT
STEM PTC DISTAL 11 90 (Stem) ×1 IMPLANT
SUCTION FRAZIER HANDLE 12FR (TUBING) ×2
SUCTION TUBE FRAZIER 12FR DISP (TUBING) ×1 IMPLANT
SUT ETHIBOND 2 V 37 (SUTURE) ×2 IMPLANT
SUT ETHIBOND NAB CT1 #1 30IN (SUTURE) ×2 IMPLANT
SUT FIBERWIRE #5 38 CONV NDL (SUTURE) ×4
SUT MNCRL AB 4-0 PS2 18 (SUTURE) ×2 IMPLANT
SUT VIC AB 0 CT1 36 (SUTURE) IMPLANT
SUT VIC AB 3-0 SH 27 (SUTURE) ×4
SUT VIC AB 3-0 SH 27X BRD (SUTURE) ×1 IMPLANT
SUTURE FIBERWR #5 38 CONV NDL (SUTURE) IMPLANT
TOWEL OR 17X26 10 PK STRL BLUE (TOWEL DISPOSABLE) ×2 IMPLANT
TRAY REV FLEX AEQUALIS 3.5X6 (Shoulder) ×1 IMPLANT
TUBE SUCTION HIGH CAP CLEAR NV (SUCTIONS) ×2 IMPLANT
WATER STERILE IRR 1000ML POUR (IV SOLUTION) ×4 IMPLANT

## 2021-09-11 NOTE — Consult Note (Signed)
[facility]       Reason for Consult: prosthetic joint infection, shoulder    Referring Physician: Dr. Griffin Basil  Principal Problem:   Status post reverse total replacement of left shoulder    acetaminophen  1,000 mg Oral Q6H   [START ON 09/12/2021] amLODipine  5 mg Oral Daily   celecoxib  200 mg Oral BID   docusate sodium  100 mg Oral BID   [START ON 09/12/2021] enoxaparin (LOVENOX) injection  40 mg Subcutaneous Q24H   gabapentin  100 mg Oral TID   [START ON 09/12/2021] loratadine  10 mg Oral Daily   [START ON 09/12/2021] metFORMIN  500 mg Oral Q breakfast   [START ON 09/12/2021] predniSONE  5 mg Oral Daily   tamsulosin  0.4 mg Oral QHS    Recommendations:  Vancomycin and ceftriaxone pending any culture growth Picc line and plan for about 2 weeks of IV antibiotics followed by oral continuation, depending on culture growth Will alert micro to hold culture 21 days  Assessment: Purulence and inflammation associated with loosening of hardware s/p 1 stage revision.  Will monitor cultures, plan for treatment as above and taper based on any culture growth.  Inflammatory markers noted and no significant elevation.  Optimal treatment duration unclear but will plan for about 6 weeks between IV and po, depending on culture growth.    Antibiotics: Starting vancomycin and ceftriaxone  HPI: Willie Howe is a 61 y.o. male with a history of rheumatoid arthritis, Graves disease, HTN, diabetes who underwent total left reverse total shoulder arthroplasty by Dr. Amedeo Kinsman 11/08/21 following a failed rotator cuff repair and here with loosening hardware and noted some purulence behind the glenosphere and synovitis, though no purulence evident in the joint.  Components were removed and underwent a 1 stage arthroplasty and cultures sent.     Review of Systems:  Constitutional: negative for fevers and chills Gastrointestinal: negative for nausea and diarrhea All other systems  reviewed and are negative    Past Medical History:  Diagnosis Date   BPPV (benign paroxysmal positional vertigo)    DVT (deep venous thrombosis) (HCC)    Graves disease 1996   Graves disease    hx of   History of kidney stones    History of pulmonary embolism    Hypertension    Obesity    Rheumatoid arthritis ([place])    Sleep apnea    Intolerant of CPAP   Type 2 diabetes mellitus (HCC)     Social History   Tobacco Use   Smoking status: Former    Packs/day: 1.50    Years: 20.00    Pack years: 30.00    Types: Cigarettes    Quit date: 02/03/1995    Years since quitting: 26.6   Smokeless tobacco: Never  Vaping Use   Vaping Use: Never used  Substance Use Topics   Alcohol use: No    Alcohol/week: 0.0 standard drinks   Drug use: No    Family History  Problem Relation Age of Onset   Deep vein thrombosis Mother    Hypertension Mother    Breast cancer Mother    Hypertension Father    Prostate cancer Father    CAD Sister     Allergies  Allergen Reactions   Lisinopril Cough   Statins Other (See Comments)    Muscle pain    Physical Exam: Constitutional: in no apparent distress  Vitals:   09/11/21 1303 09/11/21 1533  BP: 116/72 115/64  Pulse: 83 86  Resp: 16 16  Temp: 97.8 F (36.6 C) 98 F (36.7 C)  SpO2: 99% 96%   EYES: anicteric Cardiovascular: Cor RRR Respiratory: clear; GI: soft Musculoskeletal: no edema; left arm wrapped Skin: no rash  Lab Results  Component Value Date   WBC 13.2 (H) 09/11/2021   HGB 11.1 (L) 09/11/2021   HCT 35.2 (L) 09/11/2021   MCV 91.0 09/11/2021   PLT 244 09/11/2021    Lab Results  Component Value Date   CREATININE 0.85 09/05/2021   BUN 14 09/05/2021   NA 137 09/05/2021   K 3.2 (L) 09/05/2021   CL 105 09/05/2021   CO2 24 09/05/2021    Lab Results  Component Value Date   ALT 26 11/27/2019   AST 24 11/27/2019   ALKPHOS 86 11/27/2019     Microbiology: Recent Results (from the past 240 hour(s))  SARS  CORONAVIRUS 2 (TAT 6-24 HRS) Nasopharyngeal Nasopharyngeal Swab     Status: None   Collection Time: 09/09/21  7:21 AM   Specimen: Nasopharyngeal Swab  Result Value Ref Range Status   SARS Coronavirus 2 NEGATIVE NEGATIVE Final    Comment: (NOTE) SARS-CoV-2 target nucleic acids are NOT DETECTED.  The SARS-CoV-2 RNA is generally detectable in upper and lower respiratory specimens during the acute phase of infection. Negative results do not preclude SARS-CoV-2 infection, do not rule out co-infections with other pathogens, and should not be used as the sole basis for treatment or other patient management decisions. Negative results must be combined with clinical observations, patient history, and epidemiological information. The expected result is Negative.  Fact Sheet for Patients: SugarRoll.be  Fact Sheet for Healthcare Providers: https://www.woods-mathews.com/  This test is not yet approved or cleared by the [place] FDA and  has been authorized for detection and/or diagnosis of SARS-CoV-2 by FDA under an Emergency Use Authorization (EUA). This EUA will remain  in effect (meaning this test can be used) for the duration of the COVID-19 declaration under Se ction 564(b)(1) of the Act, 21 U.S.C. section 360bbb-3(b)(1), unless the authorization is terminated or revoked sooner.  Performed at [facility], [place] [address], [place], [place] [address]   Surgical pcr screen     Status: None   Collection Time: 09/11/21  5:56 AM   Specimen: Nasal Mucosa; Nasal Swab  Result Value Ref Range Status   MRSA, PCR NEGATIVE NEGATIVE Final   Staphylococcus aureus NEGATIVE NEGATIVE Final    Comment: (NOTE) The Xpert SA Assay (FDA approved for NASAL specimens in patients 73 years of age and older), is one component of a comprehensive surveillance program. It is not intended to diagnose infection nor to guide or monitor treatment. Performed at  [facility], [place] [address], [place], [place] [address]     Urania Pearlman W Donjuan Robison, MD [facility] [email or web address] 09/11/2021, 3:44 PM

## 2021-09-11 NOTE — Interval H&P Note (Signed)
All questions answered

## 2021-09-11 NOTE — Progress Notes (Signed)
Arrived to discuss PICC. Noted that anesthesia had been administered earlier today. VAST to follow up on 1-26 for procedure. Doran Stabler RN notified.

## 2021-09-11 NOTE — Discharge Instructions (Addendum)
Ophelia Charter MD, MPH Noemi Chapel, PA-C [place] [address] [phone] (tel)   [phone] (fax)   [place] may leave the operative dressing in place until your follow-up appointment. KEEP THE INCISIONS CLEAN AND DRY. There may be a small amount of fluid/bleeding leaking at the surgical site. This is normal after surgery.  If it fills with liquid or blood please call us immediately to change it for you. Use the provided ice machine or Ice packs as often as possible for the first 3-4 days, then as needed for pain relief.  Keep a layer of cloth or a shirt between your skin and the cooling unit to prevent frost bite as it can get very cold.  SHOWERING: - The dressing is water resistant but do not scrub it as it may start to peel up.   - You will want to follow the instructions from your PICC line nurse on how to care for your PICC line when showering - You may remove the sling for showering, but keep a water resistant pillow under the arm to keep both the  elbow and shoulder away from the body (mimicking the abduction sling).  - Gently pat the area dry.  - Do not soak the shoulder in water. Do not go swimming in the pool or ocean until your sutures are removed. - KEEP THE INCISIONS CLEAN AND DRY.  EXERCISES Wear the sling at all times  You may remove the sling for showering, but keep the arm across the chest or in a secondary sling.    Accidental/Purposeful External Rotation and shoulder flexion (reaching behind you) is to be avoided at all costs for the first month. Please perform the exercises (as taught by the therapist in the hospital):   Hand / Wrist  Range of Motion Exercises Grip strengthening   REGIONAL ANESTHESIA (NERVE BLOCKS) The anesthesia team may have performed a nerve block for you if safe in the setting of your care.  This is a great tool used to minimize pain.   Typically the block may start wearing off overnight but the long acting medicine may last for 3-4 days.  The nerve block wearing off can be a challenging period but please utilize your as needed pain medications to try and manage this period.    POST-OP MEDICATIONS- Multimodal approach to pain control In general your pain will be controlled with a combination of substances.  Prescriptions unless otherwise discussed are electronically sent to your pharmacy.  This is a carefully made plan we use to minimize narcotic use.     Acetaminophen - Non-narcotic pain medicine taken on a scheduled basis  Gabapentin - this is a medication to help with nerve based pain, take on a scheduled basis Oxycodone - This is a strong narcotic, to be used only on an as needed basis for SEVERE/BREAKTHROUGH pain. Take this only for breakthrough pain not controlled by your daily pain prescription Xarelto - This medicine is used to minimize the risk of blood clots after surgery. Zofran -  take as needed for nausea   FOLLOW-UP If you develop a Fever (>101.5), Redness or Drainage from the surgical incision site, please call our office to arrange for an evaluation. Please call the office to schedule a follow-up appointment for a wound check, 7-10 days post-operatively.  IF YOU HAVE ANY QUESTIONS, PLEASE FEEL FREE TO CALL OUR OFFICE.  HELPFUL INFORMATION  If  you had a block, it will wear off between 8-24 hrs postop typically.  This is period when your pain may go from nearly zero to the pain you would have had post-op without the block.  This is an abrupt transition but nothing dangerous is happening.  You may take an extra dose of narcotic when this happens.  Your arm will be in a sling following surgery. You will be in this sling for the next 4 weeks.   You will begin physical therapy 4 weeks AFTER surgery  You may be more comfortable sleeping in a semi-seated position the first few nights following surgery.  Keep a  pillow propped under the elbow and forearm for comfort.  If you have a recliner type of chair it might be beneficial.  If not that is fine too, but it would be helpful to sleep propped up with pillows behind your operated shoulder as well under your elbow and forearm.  This will reduce pulling on the suture lines.  When dressing, put your operative arm in the sleeve first.  When getting undressed, take your operative arm out last.  Loose fitting, button-down shirts are recommended.  In most states it is against the law to drive while your arm is in a sling. And certainly against the law to drive while taking narcotics.  You may return to work/school in the next couple of days when you feel up to it. Desk work and typing in the sling is fine.  We suggest you use the pain medication the first night prior to going to bed, in order to ease any pain when the anesthesia wears off. You should avoid taking pain medications on an empty stomach as it will make you nauseous.  Do not drink alcoholic beverages or take illicit drugs when taking pain medications.  Pain medication may make you constipated.  Below are a few solutions to try in this order: Decrease the amount of pain medication if you arent having pain. Drink lots of decaffeinated fluids. Drink prune juice and/or each dried prunes  If the first 3 dont work start with additional solutions Take Colace - an over-the-counter stool softener Take Senokot - an over-the-counter laxative Take Miralax - a stronger over-the-counter laxative   Dental Antibiotics:  In most cases prophylactic antibiotics for Dental procdeures after total joint surgery are not necessary.  Exceptions are as follows:  1. History of prior total joint infection  2. Severely immunocompromised (Organ Transplant, cancer chemotherapy, Rheumatoid biologic meds such as [place])  3. Poorly controlled diabetes (A1C &gt; 8.0, blood glucose over 200)  If you have one of these  conditions, contact your surgeon for an antibiotic prescription, prior to your dental procedure.   For more information including helpful videos and documents visit our website:   [email or web address]

## 2021-09-11 NOTE — Anesthesia Procedure Notes (Signed)
Anesthesia Regional Block: Interscalene brachial plexus block   Pre-Anesthetic Checklist: , timeout performed,  Correct Patient, Correct Site, Correct Laterality,  Correct Procedure, Correct Position, site marked,  Risks and benefits discussed,  Surgical consent,  Pre-op evaluation,  At surgeon's request and post-op pain management  Laterality: Left  Prep: chloraprep       Needles:  Injection technique: Single-shot  Needle Type: Echogenic Needle     Needle Length: 9cm      Additional Needles:   Procedures:,,,, ultrasound used (permanent image in chart),,    Narrative:  Start time: 09/11/2021 6:50 AM End time: 09/11/2021 6:58 AM Injection made incrementally with aspirations every 5 mL.  Performed by: Personally  Anesthesiologist: Myrtie Soman, MD  Additional Notes: Patient tolerated the procedure well without complications

## 2021-09-11 NOTE — Op Note (Addendum)
Orthopaedic Surgery Operative Note (CSN: [id number])  Willie Howe  1961-03-28 Date of Surgery: 09/11/2021   Diagnoses:  Left reverse shoulder arthroplasty with concern for infection and hardware loosening  Procedure: Left revision reverse total Shoulder Arthroplasty Left shoulder synovectomy   Operative Finding We were worried about loosening either aseptic or septic.  Based on the patient's history of rheumatoid arthritis it was a high level of suspicion for infection though his work-up had been negative.  That said upon examination the humeral component was grossly loose and was able to be withdrawn with minimal effort.  The glenoid implant had minimal on growth and was also withdrawn.  We treated the patient is a 1 stage arthroplasty as there was a small amount of purulent appearing material behind the glenosphere and though there was synovitis consistent with infection there was not gross purulence within the joint itself.  We feel that there is a high likelihood of infection.  Patient's risk for dislocation and continued infection are quite high.  Infectious disease was consulted for management.  They plan on placing a PICC line likely and discharge tomorrow may be appropriate.  Post-operative plan: The patient will be NWB in sling.  The patient will be  admitted overnight for monitoring, ID consultation and pain control.   DVT prophylaxis Lovenox 40 mg/day until mobilizing and then consider transition in clinic to alternative medicines.  Pain control with PRN pain medication preferring oral medicines.  Follow up plan will be scheduled in approximately 7 days for incision check and XR.  Physical therapy to start after 4 weeks  Implants: Tornier size 11 fully coated distal revive stem, 20 mm spacer, 13 proximal body, +6 high offset tray with a 39 x 6 polyethylene, 39 standard glenosphere, 25+3 baseplate with a short ingrowth post and 4 peripheral screws  Post-Op Diagnosis:  Same Co-surgeons:Binyamin Nelis Griffin Basil MD, Larena Glassman MD Assistants:Caroline McBane PA-C Location: [facility] ROOM 06 Anesthesia: General with Exparel Interscalene Antibiotics: Ancef 2g preop, Vancomycin 1053m locally, tobramycin 1.2 g Tourniquet time: None Estimated Blood Loss: 1916Complications: None Specimens: 3 for culture, hold for 3 weeks rule out C acnes Implants: Implant Name Type Inv. Item Serial No. Manufacturer Lot No. LRB No. Used Action  SCREW BONE INTRNL SM 7 - [id number] SCREW BONE INTRNL SM 7 [id number]  Left 1 Implanted  BASEPLATE GLENOID RSA [id number] - [id number] BASEPLATE GLENOID RSA [id number]  Left 1 Implanted  GLENOSPHERE STANDARD 39 - [id number] GLENOSPHERE STANDARD 39  [employer] [id number] 1 Implanted  CAP LOCKING COCR - [id number] CAP LOCKING COCR  [employer] [id number] 1 Implanted  STEM PTC DISTAL 11 90 - [id number] STEM PTC DISTAL 11 90  [employer] [id number] 1 Implanted  SCREW REV AEQUALIS 20 - [id number] SCREW REV AEQUALIS 20  [employer] [id number] 1 Implanted  PROXIMAL BODY PTC 13X132.5 - [id number] PROXIMAL BODY PTC 13X132.5  [employer] [id number] 1 Implanted  SPACER REV AEQUALIS 11 - [id number] SPACER REV AEQUALIS 11  [employer] [id number] 1 Implanted  INSERT REV KIT SHOULDER 6X39 - [id number] INSERT REV KIT SHOULDER 6X39  [employer] [id number] 1 Implanted  TRAY REV FLEX AEQUALIS 3.5X6 - [id number] TRAY REV FLEX AEQUALIS 3.5X6  [employer] [id number] 1 Implanted  SCREW PERIPHERAL 5.0X34 - [id number] SCREW PERIPHERAL 5.0X34  [employer]  Left 2 Implanted  SCREW 5.0X18 - [id number]  Screw SCREW 5.0X18  [employer]  Left 1 Implanted  SCREW 5.0X38 SMALL F/PERFORM - [id number] Screw SCREW 5.0X38 SMALL F/PERFORM  [employer]  Left 1 Implanted    Indications for Surgery:   Willie Howe is a 61 y.o. male with history of a reverse social  arthroplasty with increased pain and concern for infection versus aseptic loosening of implants.  Benefits and risks of operative and nonoperative management were discussed prior to surgery with patient/guardian(s) and informed consent form was completed.  Infection and need for further surgery were discussed as was prosthetic stability and cuff issues.  We additionally specifically discussed risks of axillary nerve injury, infection, periprosthetic fracture, continued pain and longevity of implants prior to beginning procedure.      Procedure:   The patient was identified in the preoperative holding area where the surgical site was marked. Block placed by anesthesia with exparel.  The patient was taken to the OR where a procedural timeout was called and the above noted anesthesia was induced.  The patient was positioned beachchair on allen table with spider arm positioner.  Preoperative antibiotics were dosed.  The patient's left shoulder was prepped and draped in the usual sterile fashion.  A second preoperative timeout was called.       We used the patient's previous incision and used a deltopectoral approach.  We extended about a centimeter proximal and distal.  With the skin sharp achieving hemostasis we progressed.  We did not find the deltopectoral interval but it was very scarred in and adherent consistent with my experience of previous infections.  We are able to identify the duct deltopectoral interval and noted that the conjoined tendon was quite scarred to the remnant of the subscapularis.  We dissected somewhat around this area but we are careful not to dissect inferiorly as the axillary nerve would have been in this region.  We took care to place blunt retractors to avoid damage or stress to the area of the axillary nerve.  We peeled the capsule subperiosteally off the humerus externally rotating as we progressed.  We placed blunt retractors to help with this.  At this point it was clear that  the implant had subsided somewhat.  We dislocated the implant and the humeral stem was easily withdrawn with essentially pliers only.  There is no bone loss from this.  We found significant fibrinous tissue behind the implant and some purulent appearing material behind the implant itself.  We sent this for specimen.  3 specimens were sent all to be held for 3 weeks for P acnes.  We used a curette as well as a rondure to excisionally debride any unhealthy appearing and fibrinous tissue as well as purulent tissue from the canal of the humerus.  Once we are down to bleeding bone we irrigated with 3 L normal saline.  This point return attention to glenoid component.  We used the Arthrex system to remove the glenosphere as well as the peripheral screws.  At that point it was clear that the baseplate was relatively loose.  There is little on growth of bone onto the implant.  Once the implant was removed we felt that it was appropriate since there was not a significant amount of purulent material to treat this is 1 stage.  Complete synovectomy was performed and any unhealthy and purulent material was removed.  We irrigated copiously and then proceeded to implant our glenoid.  We placed our center pin and reamed over this to  create a concentric surface.  At that point we reamed for our central boss and then placed our ingrowth peg reamer.  Once the surface was prepped we placed a Tornier 25+3 baseplate taking care to orient the holes outside of the spectrum of the previously placed holes.  We achieved good fixation with our center peg and our 4 peripheral pegs were drilled 2 locking and 2 nonlocking with screws placed.  We had robust fixation and placed our 39 standard glenosphere without issue.  This point we placed our attention back on the humerus.  We used the revive system to trial and were able to get good fit with a implant as listed above.  We placed our trial and had good tension on the system.  At that  point we withdrew the trials prepped the surfaces with irrigation and placing Irrisept solution irrigated again.  We placed 4 FiberWire sutures for capsular closure and reapproximation of what was left of the subscapularis.  This point final implants were placed.  The joint was reduced and the subscapularis remnant repaired.  The joint is very stable.  There is no sign of impingement.  We irrigated again and placed local vancomycin and tobramycin powder.  We closed in a multilayer fashion finishing with 2-0 nylon in a running mattress fashion.  Sterile dressing was placed.  Sling was placed.  Due to the the complexity of this revision case two surgeons were necessary for appropriate instrumentation, reduction and management of the case.  Larena Glassman, MD was present throughout as a co-surgeon.  Noemi Chapel, PA-C, present and scrubbed throughout the case, critical for completion in a timely fashion, and for retraction, instrumentation, closure.

## 2021-09-11 NOTE — Transfer of Care (Signed)
Immediate Anesthesia Transfer of Care Note  Patient: Willie Howe  Procedure(s) Performed: REVISION TOTAL SHOULDER TO REVERSE TOTAL SHOULDER (Left: Shoulder)  Patient Location: PACU  Anesthesia Type:General  Level of Consciousness: awake, alert  and oriented  Airway & Oxygen Therapy: Patient Spontanous Breathing and Patient connected to face mask oxygen  Post-op Assessment: Report given to RN and Post -op Vital signs reviewed and stable  Post vital signs: Reviewed and stable  Last Vitals:  Vitals Value Taken Time  BP 116/99 09/11/21 1004  Temp    Pulse 42 09/11/21 1011  Resp 14 09/11/21 1011  SpO2 100 % 09/11/21 1011  Vitals shown include unvalidated device data.  Last Pain:  Vitals:   09/11/21 0536  TempSrc:   PainSc: 0-No pain      Patients Stated Pain Goal: 3 (88/67/73 7366)  Complications: No notable events documented.

## 2021-09-11 NOTE — Anesthesia Postprocedure Evaluation (Signed)
Anesthesia Post Note  Patient: Willie Howe  Procedure(s) Performed: REVISION TOTAL SHOULDER TO REVERSE TOTAL SHOULDER (Left: Shoulder)     Patient location during evaluation: PACU Anesthesia Type: General Level of consciousness: awake and alert Pain management: pain level controlled Vital Signs Assessment: post-procedure vital signs reviewed and stable Respiratory status: spontaneous breathing, nonlabored ventilation, respiratory function stable and patient connected to nasal cannula oxygen Cardiovascular status: blood pressure returned to baseline and stable Postop Assessment: no apparent nausea or vomiting Anesthetic complications: no   No notable events documented.  Last Vitals:  Vitals:   09/11/21 1100 09/11/21 1115  BP: (!) 88/60 98/63  Pulse: 87 85  Resp: 17 13  Temp:    SpO2: 96% 96%    Last Pain:  Vitals:   09/11/21 1115  TempSrc:   PainSc: Asleep                 Eldean Nanna S

## 2021-09-11 NOTE — Anesthesia Procedure Notes (Addendum)
Procedure Name: Intubation Date/Time: 09/11/2021 7:49 AM Performed by: Gean Maidens, CRNA Pre-anesthesia Checklist: Patient identified, Emergency Drugs available, Suction available, Patient being monitored and Timeout performed Patient Re-evaluated:Patient Re-evaluated prior to induction Oxygen Delivery Method: Circle system utilized Preoxygenation: Pre-oxygenation with 100% oxygen Induction Type: IV induction Ventilation: Mask ventilation without difficulty Laryngoscope Size: Mac and 4 Grade View: Grade I Tube type: Oral Tube size: 7.5 mm Number of attempts: 1 Airway Equipment and Method: Stylet Placement Confirmation: ETT inserted through vocal cords under direct vision, positive ETCO2 and breath sounds checked- equal and bilateral Secured at: 23 cm Tube secured with: Tape Dental Injury: Teeth and Oropharynx as per pre-operative assessment

## 2021-09-11 NOTE — Plan of Care (Signed)
°  Problem: Education: Goal: Knowledge of the prescribed therapeutic regimen will improve Outcome: Progressing   Problem: Activity: Goal: Ability to tolerate increased activity will improve Outcome: Progressing   Problem: Pain Management: Goal: Pain level will decrease with appropriate interventions Outcome: Progressing   

## 2021-09-11 NOTE — Anesthesia Procedure Notes (Signed)
Anesthesia Procedure Image    

## 2021-09-12 ENCOUNTER — Encounter (HOSPITAL_COMMUNITY): Payer: Self-pay | Admitting: Orthopaedic Surgery

## 2021-09-12 DIAGNOSIS — T8459XD Infection and inflammatory reaction due to other internal joint prosthesis, subsequent encounter: Secondary | ICD-10-CM

## 2021-09-12 DIAGNOSIS — Z96612 Presence of left artificial shoulder joint: Secondary | ICD-10-CM | POA: Diagnosis not present

## 2021-09-12 LAB — BASIC METABOLIC PANEL
Anion gap: 7 (ref 5–15)
BUN: 14 mg/dL (ref 8–23)
CO2: 23 mmol/L (ref 22–32)
Calcium: 8.4 mg/dL — ABNORMAL LOW (ref 8.9–10.3)
Chloride: 101 mmol/L (ref 98–111)
Creatinine, Ser: 0.83 mg/dL (ref 0.61–1.24)
GFR, Estimated: >60 mL/min (ref >60)
Glucose, Bld: 278 mg/dL — ABNORMAL HIGH (ref 70–99)
Potassium: 3.7 mmol/L (ref 3.5–5.1)
Sodium: 131 mmol/L — ABNORMAL LOW (ref 135–145)

## 2021-09-12 LAB — CBC
HCT: 30.1 % — ABNORMAL LOW (ref 39.0–52.0)
Hemoglobin: 9.5 g/dL — ABNORMAL LOW (ref 13.0–17.0)
MCH: 28 pg (ref 26.0–34.0)
MCHC: 31.6 g/dL (ref 30.0–36.0)
MCV: 88.8 fL (ref 80.0–100.0)
Platelets: 236 10*3/uL (ref 150–400)
RBC: 3.39 MIL/uL — ABNORMAL LOW (ref 4.22–5.81)
RDW: 17.8 % — ABNORMAL HIGH (ref 11.5–15.5)
WBC: 11.5 10*3/uL — ABNORMAL HIGH (ref 4.0–10.5)
nRBC: 0 % (ref 0.0–0.2)

## 2021-09-12 LAB — ACID FAST SMEAR (AFB, MYCOBACTERIA)
Acid Fast Smear: NEGATIVE
Acid Fast Smear: NEGATIVE
Acid Fast Smear: NEGATIVE

## 2021-09-12 LAB — GLUCOSE, CAPILLARY
Glucose-Capillary: 131 mg/dL — ABNORMAL HIGH (ref 70–99)
Glucose-Capillary: 106 mg/dL — ABNORMAL HIGH (ref 70–99)
Glucose-Capillary: 168 mg/dL — ABNORMAL HIGH (ref 70–99)

## 2021-09-12 LAB — CK: Total CK: 430 U/L — ABNORMAL HIGH (ref 49–397)

## 2021-09-12 MED ORDER — ACETAMINOPHEN 500 MG PO TABS: 1000.0000 mg | ORAL_TABLET | Freq: Three times a day (TID) | ORAL | 0 refills | Status: AC

## 2021-09-12 MED ORDER — VANCOMYCIN HCL 1500 MG/300ML IV SOLN
1500.0000 mg | Freq: Two times a day (BID) | INTRAVENOUS | Status: DC
  Administered 2021-09-12: 1500 mg via INTRAVENOUS
  Filled 2021-09-12: qty 300

## 2021-09-12 MED ORDER — OXYCODONE HCL 5 MG PO TABS: ORAL_TABLET | ORAL | 0 refills | Status: AC

## 2021-09-12 MED ORDER — GABAPENTIN 100 MG PO CAPS: 100.0000 mg | ORAL_CAPSULE | Freq: Three times a day (TID) | ORAL | 0 refills | Status: DC

## 2021-09-12 MED ORDER — CEFTRIAXONE IV (FOR PTA / DISCHARGE USE ONLY): 2.0000 g | INTRAVENOUS | 0 refills | Status: DC

## 2021-09-12 MED ORDER — CHLORHEXIDINE GLUCONATE CLOTH 2 % EX PADS: 6.0000 | MEDICATED_PAD | Freq: Every day | CUTANEOUS | Status: DC

## 2021-09-12 MED ORDER — SODIUM CHLORIDE 0.9% FLUSH: 10.0000 mL | INTRAVENOUS | Status: DC | PRN

## 2021-09-12 MED ORDER — RIVAROXABAN 10 MG PO TABS: 10.0000 mg | ORAL_TABLET | Freq: Every day | ORAL | 0 refills | Status: AC

## 2021-09-12 MED ORDER — ONDANSETRON HCL 4 MG PO TABS: 4.0000 mg | ORAL_TABLET | Freq: Three times a day (TID) | ORAL | 0 refills | Status: AC | PRN

## 2021-09-12 NOTE — Progress Notes (Signed)
Spoke to primary RN, made aware PICC will be placed today.

## 2021-09-12 NOTE — Plan of Care (Signed)
°  Problem: Education: Goal: Knowledge of the prescribed therapeutic regimen will improve Outcome: Adequate for Discharge Goal: Understanding of activity limitations/precautions following surgery will improve Outcome: Adequate for Discharge Goal: Individualized Educational Video(s) Outcome: Adequate for Discharge   Problem: Activity: Goal: Ability to tolerate increased activity will improve Outcome: Adequate for Discharge   Problem: Pain Management: Goal: Pain level will decrease with appropriate interventions Outcome: Adequate for Discharge

## 2021-09-12 NOTE — Evaluation (Signed)
Occupational Therapy Evaluation Patient Details Name: Willie Howe MRN: [id number] DOB: 1961-03-20 Today's Date: 09/12/2021   History of Present Illness Patient is a 61 year old male s/p revision L shoulder replacement. Patient reports this is his third shoulder surgery. PMH inlcudes back and knee surgery   Clinical Impression   Patient is a 62 year old male s/p  shoulder revision replacement without functional use of left non- dominant upper extremity secondary to effects of surgery and interscalene block and shoulder precautions. Therapist provided education and instruction to patient and spouse in regards to exercises, precautions, positioning, donning upper extremity clothing and bathing while maintaining shoulder precautions, ice and edema management and donning/doffing sling. Patient and spouse verbalized understanding and demonstrated as needed. Patient to follow up with MD for further therapy needs.        Recommendations for follow up therapy are one component of a multi-disciplinary discharge planning process, led by the attending physician.  Recommendations may be updated based on patient status, additional functional criteria and insurance authorization.   Follow Up Recommendations  Follow physician's recommendations for discharge plan and follow up therapies    Assistance Recommended at Discharge PRN  Patient can return home with the following A little help with bathing/dressing/bathroom    Functional Status Assessment  Patient has had a recent decline in their functional status and demonstrates the ability to make significant improvements in function in a reasonable and predictable amount of time.  Equipment Recommendations  None recommended by OT       Precautions / Restrictions Precautions Precautions: Shoulder Type of Shoulder Precautions: A/PROM shoulder NO, AROM elbow, wrist, hand ok Shoulder Interventions: Shoulder sling/immobilizer;Off for  dressing/bathing/exercises Precaution Booklet Issued: Yes (comment) Required Braces or Orthoses: Sling Restrictions Weight Bearing Restrictions: Yes LUE Weight Bearing: Non weight bearing      Mobility Bed Mobility Overal bed mobility: Modified Independent                  Transfers Overall transfer level: Independent                        Balance Overall balance assessment: No apparent balance deficits (not formally assessed)                                         ADL either performed or assessed with clinical judgement   ADL Overall ADL's : Needs assistance/impaired Eating/Feeding: Independent   Grooming: Independent   Upper Body Bathing: Minimal assistance;Standing;Sitting   Lower Body Bathing: Minimal assistance;Sitting/lateral leans;Sit to/from stand   Upper Body Dressing : Moderate assistance;Sitting;Standing Upper Body Dressing Details (indicate cue type and reason): Educated patient and spouse on how to sequence don/doff shirt. Patient and spouse familiar as he was needing assistance for this prior to surgery/knowledge from previous surgeries. Lower Body Dressing: Moderate assistance;Sitting/lateral leans;Sit to/from stand   Toilet Transfer: Independent;Ambulation Toilet Transfer Details (indicate cue type and reason): Patient did not need assistance with standing from edge of bed, ambulating to bathroom and transferring to recliner Toileting- Clothing Manipulation and Hygiene: Minimal assistance;Sitting/lateral lean;Sit to/from stand       Functional mobility during ADLs: Independent General ADL Comments: Patient and spouse educated in shoulder precautions and how to maintain during self care, verbalize understanding and familiar from previous surgeries      Pertinent Vitals/Pain Pain Assessment Pain Assessment: Faces Faces  Pain Scale: Hurts little more Pain Location: L UE Pain Descriptors / Indicators: Heaviness,  Numbness Pain Intervention(s): Monitored during session     Hand Dominance Right   Extremity/Trunk Assessment Upper Extremity Assessment Upper Extremity Assessment: LUE deficits/detail LUE Deficits / Details: + nerve block   Lower Extremity Assessment Lower Extremity Assessment: Overall WFL for tasks assessed   Cervical / Trunk Assessment Cervical / Trunk Assessment: Normal   Communication Communication Communication: No difficulties   Cognition Arousal/Alertness: Awake/alert Behavior During Therapy: WFL for tasks assessed/performed Overall Cognitive Status: Within Functional Limits for tasks assessed                                          Exercises Exercises: Shoulder   Shoulder Instructions Shoulder Instructions Donning/doffing shirt without moving shoulder: Patient able to independently direct caregiver;Caregiver independent with task Method for sponge bathing under operated UE: Patient able to independently direct caregiver;Caregiver independent with task Donning/doffing sling/immobilizer: Patient able to independently direct caregiver;Caregiver independent with task Correct positioning of sling/immobilizer: Patient able to independently direct caregiver;Caregiver independent with task ROM for elbow, wrist and digits of operated UE: Patient able to independently direct caregiver;Caregiver independent with task Sling wearing schedule (on at all times/off for ADL's): Patient able to independently direct caregiver;Caregiver independent with task Proper positioning of operated UE when showering: Patient able to independently direct caregiver;Caregiver independent with task Positioning of UE while sleeping: Patient able to independently direct caregiver;Caregiver independent with task    Home Living Family/patient expects to be discharged to:: Private residence Living Arrangements: Spouse/significant other Available Help at Discharge: Family Type of Home:  House Home Access: Stairs to enter Technical brewer of Steps: 3   Home Layout: One level     Bathroom Shower/Tub: Teacher, early years/pre: Handicapped height     Home Equipment: Grab bars - tub/shower;Cane - single point          Prior Functioning/Environment Prior Level of Function : Independent/Modified Independent             Mobility Comments: Uses cane as needed ADLs Comments: Patient states if having a bad day spouse will assist with ADLs as needed        OT Problem List: Pain;Impaired UE functional use         OT Goals(Current goals can be found in the care plan section) Acute Rehab OT Goals Patient Stated Goal: "Let this be my last surgery" OT Goal Formulation: All assessment and education complete, DC therapy   AM-PAC OT "6 Clicks" Daily Activity     Outcome Measure Help from another person eating meals?: None Help from another person taking care of personal grooming?: None Help from another person toileting, which includes using toliet, bedpan, or urinal?: A Little Help from another person bathing (including washing, rinsing, drying)?: A Little Help from another person to put on and taking off regular upper body clothing?: A Little Help from another person to put on and taking off regular lower body clothing?: A Little 6 Click Score: 20   End of Session Equipment Utilized During Treatment: Other (comment) (sling) Nurse Communication: Mobility status  Activity Tolerance: Patient tolerated treatment well Patient left: in chair;with call bell/phone within reach;with family/visitor present  OT Visit Diagnosis: Pain Pain - Right/Left: Left Pain - part of body: Shoulder  Time: 8343-7357 OT Time Calculation (min): 16 min Charges:  OT General Charges $OT Visit: 1 Visit OT Evaluation $OT Eval Low Complexity: Rote OT OT pager:  09/12/2021, 12:51 PM

## 2021-09-12 NOTE — Progress Notes (Signed)
Pharmacy Antibiotic Note  Willie Howe is a 61 y.o. 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 Patient underwent revision in OR yesterday. Pharmacy has been consulted for Vancomycin dosing-last dose of Vancomycin 1g IV @ 0221. ID is following.   Plan: Vancomycin 1500mg  IV q12h for goal AUC 400-550 Vancomycin levels at steady state Ceftriaxone 2g IV q24h per MD Follow renal function, cultures, clinical course, further ID recommendations  Height: 6' 3.5" (191.8 cm) Weight: 122 kg (269 lb) IBW/kg (Calculated) : 85.65  Temp (24hrs), Avg:98 F (36.7 C), Min:97.5 F (36.4 C), Max:98.7 F (37.1 C)  Recent Labs  Lab 09/05/21 1346 09/11/21 1028 09/12/21 0311  WBC 8.9 13.2* 11.5*  CREATININE 0.85  --  0.83    Estimated Creatinine Clearance: 130.8 mL/min (by C-G formula based on SCr of 0.83 mg/dL).    Allergies  Allergen Reactions   Lisinopril Cough   Statins Other (See Comments)    Muscle pain    Antimicrobials this admission: 1/25 Cefazolin x 1 pre-op 1/25 Vancomycin >> 1/25 Ceftriaxone >>  Dose adjustments this admission: --  Microbiology results: 1/25 Surgical PCR screen: neg 1/25 AFB: 1/25 Fungus culture (L shoulder):  1/25 L shoulder tissue spec 1: NGTD, holding 3 weeks for P. acne 1/25 L shoulder tissue spec 2: rare GPR on gram stain, NGTD on cx, holding 3 weeks for P. acne 1/25 L shoulder tissue spec 3: few GPR on gram stain, NGTD on cx, holding 3 weeks for P. acne   Thank you for allowing pharmacy to be a part of this patients care.   Lindell Spar, PharmD, BCPS Clinical Pharmacist  09/12/2021 8:20 AM

## 2021-09-12 NOTE — Progress Notes (Addendum)
PHARMACY CONSULT NOTE FOR:  OUTPATIENT  PARENTERAL ANTIBIOTIC THERAPY (OPAT)  Indication: Shoulder PJI Regimen: Rocephin 2g IV every 24 hours then likely to transition to orals for the subsequent duration at the ID clinic visit End date: 10/01/21  IV antibiotic discharge orders are pended. To discharging provider:  please sign these orders via discharge navigator,  Select New Orders & click on the button choice - Manage This Unsigned Work.    Thank you for allowing pharmacy to be a part of this patients care.  Alycia Rossetti, PharmD, BCPS Clinical Pharmacist 09/12/2021 12:48 PM

## 2021-09-12 NOTE — Progress Notes (Signed)
Peripherally Inserted Central Catheter Placement  The IV Nurse has discussed with the patient and/or persons authorized to consent for the patient, the purpose of this procedure and the potential benefits and risks involved with this procedure.  The benefits include less needle sticks, lab draws from the catheter, and the patient may be discharged home with the catheter. Risks include, but not limited to, infection, bleeding, blood clot (thrombus formation), and puncture of an artery; nerve damage and irregular heartbeat and possibility to perform a PICC exchange if needed/ordered by physician.  Alternatives to this procedure were also discussed.  Bard Power PICC patient education guide, fact sheet on infection prevention and patient information card has been provided to patient /or left at bedside.    PICC Placement Documentation  PICC Single Lumen 09/12/21 Right Brachial 44 cm 0 cm (Active)  Indication for Insertion or Continuance of Line Home intravenous therapies (PICC only) 09/12/21 1641  Exposed Catheter (cm) 0 cm 09/12/21 1641  Site Assessment Clean;Dry;Intact 09/12/21 1641  Line Status Flushed;Saline locked;Blood return noted 09/12/21 1641  Dressing Type Transparent 09/12/21 1641  Dressing Status Clean;Dry;Intact 09/12/21 1641  Antimicrobial disc in place? Yes 09/12/21 1641  Dressing Intervention New dressing;Other (Comment) 09/12/21 1641  Dressing Change Due 09/19/21 09/12/21 1641       Christella Noa Albarece 09/12/2021, 4:43 PM

## 2021-09-12 NOTE — Progress Notes (Signed)
[facility] for Infectious Disease   Reason for visit: Follow up on infection of shoulder with a history of reverse total shoulder with revision  Interval History: no new complaints.  Gram stain from both cultures noted and positive for GPR.  WBC 11.5, remains afebrile.    Physical Exam: Constitutional:  Vitals:   09/12/21 0028 09/12/21 0551  BP: 122/69 115/69  Pulse: 81 82  Resp: 17 17  Temp: 98 F (36.7 C) 98 F (36.7 C)  SpO2: 95% 96%   patient appears in NAD Respiratory: Normal respiratory effort; CTA B Cardiovascular: RRR GI: soft, nt, nd MS: left shoulder in a sling  Review of Systems: Constitutional: negative for fevers and chills Gastrointestinal: negative for diarrhea Integument/breast: negative for rash  Lab Results  Component Value Date   WBC 11.5 (H) 09/12/2021   HGB 9.5 (L) 09/12/2021   HCT 30.1 (L) 09/12/2021   MCV 88.8 09/12/2021   PLT 236 09/12/2021    Lab Results  Component Value Date   CREATININE 0.83 09/12/2021   BUN 14 09/12/2021   NA 131 (L) 09/12/2021   K 3.7 09/12/2021   CL 101 09/12/2021   CO2 23 09/12/2021    Lab Results  Component Value Date   ALT 26 11/27/2019   AST 24 11/27/2019   ALKPHOS 86 11/27/2019     Microbiology: Recent Results (from the past 240 hour(s))  SARS CORONAVIRUS 2 (TAT 6-24 HRS) Nasopharyngeal Nasopharyngeal Swab     Status: None   Collection Time: 09/09/21  7:21 AM   Specimen: Nasopharyngeal Swab  Result Value Ref Range Status   SARS Coronavirus 2 NEGATIVE NEGATIVE Final    Comment: (NOTE) SARS-CoV-2 target nucleic acids are NOT DETECTED.  The SARS-CoV-2 RNA is generally detectable in upper and lower respiratory specimens during the acute phase of infection. Negative results do not preclude SARS-CoV-2 infection, do not rule out co-infections with other pathogens, and should not be used as the sole basis for treatment or other patient management decisions. Negative results must be combined with  clinical observations, patient history, and epidemiological information. The expected result is Negative.  Fact Sheet for Patients: SugarRoll.be  Fact Sheet for Healthcare Providers: https://www.woods-mathews.com/  This test is not yet approved or cleared by the [place] FDA and  has been authorized for detection and/or diagnosis of SARS-CoV-2 by FDA under an Emergency Use Authorization (EUA). This EUA will remain  in effect (meaning this test can be used) for the duration of the COVID-19 declaration under Se ction 564(b)(1) of the Act, 21 U.S.C. section 360bbb-3(b)(1), unless the authorization is terminated or revoked sooner.  Performed at [facility], [place] [address], [place], [place] [address]   Surgical pcr screen     Status: None   Collection Time: 09/11/21  5:56 AM   Specimen: Nasal Mucosa; Nasal Swab  Result Value Ref Range Status   MRSA, PCR NEGATIVE NEGATIVE Final   Staphylococcus aureus NEGATIVE NEGATIVE Final    Comment: (NOTE) The Xpert SA Assay (FDA approved for NASAL specimens in patients 64 years of age and older), is one component of a comprehensive surveillance program. It is not intended to diagnose infection nor to guide or monitor treatment. Performed at [facility], [place] [address], [place], [place] [address]   Aerobic/Anaerobic Culture w Gram Stain (surgical/deep wound)     Status: None (Preliminary result)   Collection Time: 09/11/21  8:51 AM   Specimen: Synovial, Left Shoulder; Tissue  Result Value Ref Range Status   Specimen Description   Final    TISSUE LT SHOULDER SPEC 1 Performed at [facility], [place] [address] [place], [place] [address]    Special Requests   Final    HOLD FOR 3WKS FOR P ACNE Performed at [place] [address], [place], [place] [address]    Gram Stain   Final    RARE WBC PRESENT, PREDOMINANTLY MONONUCLEAR NO  ORGANISMS SEEN    Culture   Final    NO GROWTH < 24 HOURS CONTINUING TO HOLD Performed at [facility], [place] [address], [place], [place] [address]    Report Status PENDING  Incomplete  Aerobic/Anaerobic Culture w Gram Stain (surgical/deep wound)     Status: None (Preliminary result)   Collection Time: 09/11/21  8:57 AM   Specimen: Synovial, Left Shoulder; Tissue  Result Value Ref Range Status   Specimen Description   Final    TISSUE LT SHOULDER SPEC 2 Performed at [place] [address], [place], [place] [address]    Special Requests   Final    HOLD 3WKS FOR P ACNE Performed at [facility] [address] [place], [place] [address]    Gram Stain   Final    FEW WBC PRESENT, PREDOMINANTLY MONONUCLEAR RARE GRAM POSITIVE RODS    Culture   Final    NO GROWTH < 24 HOURS CONTINUING TO HOLD Performed at [facility], [place] [address], [place], [place] [address]    Report Status PENDING  Incomplete  Aerobic/Anaerobic Culture w Gram Stain (surgical/deep wound)     Status: None (Preliminary result)   Collection Time: 09/11/21  8:57 AM   Specimen: Synovial, Left Shoulder; Tissue  Result Value Ref Range Status   Specimen Description   Final    TISSUE LT [place] 3 Performed at [place] [address], [place], [place] [address]    Special Requests   Final    HOLD 3WKS FOR P ACNE Performed at [place] [address] [place], [place] [address]    Gram Stain   Final    FEW WBC PRESENT, PREDOMINANTLY MONONUCLEAR FEW GRAM POSITIVE RODS    Culture   Final    NO GROWTH < 24 HOURS CONTINUING TO HOLD Performed at [facility], [place] [address] [place],  [address]    Report Status PENDING  Incomplete    Impression/Plan:  1. Infection at site of reverse total shoulder - all hardware removed at time of surgery and some purulence noted behind glenosphere but no pus in  joint.  His grams stain was noted with GPR and is c/w the likelihood of Cutibacterium (P) acnes, as is typical for shoulder infections.  Will continue to monitor the culture and I will have him remain on ceftriaxone IV for 2-3 weeks and can consider transitioning to oral doxycycline 100 mg twice a day (or amoxicillin) after that to complete about 6 weeks of treatment if growth as expected with C acnes.  2.   Medication monitoring - will monitor CBC, CMP while on antibiotics  3.  Access - picc line ordered   Diagnosis: Infection around reverse total shoulder s/p 1 stage revision  Culture Result: gram stain with GPR, culture pending  Allergies  Allergen Reactions   Lisinopril Cough   Statins Other (See Comments)    Muscle pain    OPAT  Orders Discharge antibiotics to be given via PICC line Discharge antibiotics: ceftriaxone 2 grams IV every 24 hours  Per pharmacy protocol yes  Duration: 2 weeks End Date: 10/01/21  [facility] Care Per Protocol: yes  Home health RN for IV administration and teaching; PICC line care and labs.    Labs weekly while on IV antibiotics: _x_ CBC with differential __ BMP _x_ CMP __ CRP __ ESR __ Vancomycin trough __ CK  __ Please pull PIC at completion of IV antibiotics _x_ Please leave PIC in place until doctor has seen patient or been notified  Fax weekly labs to [phone]  Clinic Follow Up Appt: 10/01/21   @ 9:30 am with Janene Madeira

## 2021-09-12 NOTE — Progress Notes (Signed)
° °  ORTHOPAEDIC PROGRESS NOTE  s/p Procedure(s): REVISION TOTAL SHOULDER TO REVERSE TOTAL SHOULDER  SUBJECTIVE: Reports mild to moderate pain about operative site. Nerve block beginning to wear off. No chest pain. No SOB. No nausea/vomiting. No other complaints.  OBJECTIVE: PE: General: sitting up in hospital bed, NAD LUE: Dressing with some bloody drainage. Dressing removed. Incision CDI. Nylon sutures in place. No active drainage. New aquacel placed. sling well fitting,  full and painless ROM throughout hand with DPC of 0.  Axillary nerve sensation/motor altered in setting of block and unable to be fully tested.  Distal motor and sensory altered in setting of block. Warm well perfused hand.    Vitals:   09/12/21 0028 09/12/21 0551  BP: 122/69 115/69  Pulse: 81 82  Resp: 17 17  Temp: 98 F (36.7 C) 98 F (36.7 C)  SpO2: 95% 96%     ASSESSMENT: Willie Howe is a 61 y.o. male doing well postoperatively. POD#1  PLAN: Weightbearing: NWB LUE Insicional and dressing care: Reinforce dressings as needed Orthopedic device(s):  Sling Showering: Post-op day #2 with assistance VTE prophylaxis: Lovenox while inpatient then will transition to Xarelto Pain control: PRN pain medications, minimize narcotics as able Follow - up plan: 1 week in office Contact information:  Dr. Ophelia Charter, Noemi Chapel PA-C , After hours and holidays please check [email or web address] for group call information for [facility]  Dispo: Plan for discharge after PICC line placement and all home health needs/PICC line/antibiotics have been set up. Hopefully later this afternoon   Noemi Chapel, PA-C 09/12/2021

## 2021-09-12 NOTE — TOC Transition Note (Signed)
Transition of Care [facility]) - CM/SW Discharge Note  Patient Details  Name: Willie Howe MRN: [id number] Date of Birth: 18-Feb-1961  Transition of Care [facility]) CM/SW Contact:  Sherie Don, LCSW Phone Number: 09/12/2021, 2:24 PM  Clinical Narrative: CSW notified patient will need home IV antibiotics. Referral made to [facility] with Ysidro Evert, who set up [facility] with [facility]. CSW updated patient regardng HHRN and IV antibiotics, which have also been added to the AVS. TOC signing off.  Final next level of care: [place] Barriers to Discharge: No Barriers Identified  Patient Goals and CMS Choice Patient states their goals for this hospitalization and ongoing recovery are:: Discharge home with IV antibiotics and [facility] CMS Medicare.gov Compare Post Acute Care list provided to:: Patient Choice offered to / list presented to : Patient  Discharge Plan and Services         DME Arranged: N/A DME Agency: NA HH Arranged: RN, IV Antibiotics HH Agency: [facility] (Nanine Means is now called [facility].) Date HH Agency Contacted: 09/12/21 Representative spoke with at [place]: Osborne Casco)  Readmission Risk Interventions No flowsheet data found.

## 2021-09-12 NOTE — Plan of Care (Signed)
°  Problem: Education: Goal: Knowledge of the prescribed therapeutic regimen will improve Outcome: Progressing   Problem: Activity: Goal: Ability to tolerate increased activity will improve Outcome: Progressing   Problem: Pain Management: Goal: Pain level will decrease with appropriate interventions Outcome: Progressing   

## 2021-09-13 DIAGNOSIS — T8459XA Infection and inflammatory reaction due to other internal joint prosthesis, initial encounter: Secondary | ICD-10-CM | POA: Diagnosis not present

## 2021-09-13 DIAGNOSIS — Z96612 Presence of left artificial shoulder joint: Secondary | ICD-10-CM | POA: Diagnosis not present

## 2021-09-13 DIAGNOSIS — T8450XD Infection and inflammatory reaction due to unspecified internal joint prosthesis, subsequent encounter: Secondary | ICD-10-CM | POA: Diagnosis not present

## 2021-09-13 NOTE — Discharge Summary (Signed)
Patient ID: Willie Howe MRN: [id number] DOB/AGE: 63-Dec-1962 63 y.o.  Admit date: 09/11/2021 Discharge date: 09/13/2021  Admission Diagnoses:Left reverse shoulder arthroplasty with concern for infection and hardware loosening  Discharge Diagnoses:  Prosthetic joint infection Principal Problem:   Status post reverse total replacement of left shoulder   Past Medical History:  Diagnosis Date   BPPV (benign paroxysmal positional vertigo)    DVT (deep venous thrombosis) (HCC)    Graves disease 1996   Graves disease    hx of   History of kidney stones    History of pulmonary embolism    Hypertension    Obesity    Rheumatoid arthritis ([place])    Sleep apnea    Intolerant of CPAP   Type 2 diabetes mellitus ([place])      Procedures Performed:  Left revision reverse total Shoulder Arthroplasty Left shoulder synovectomy  Discharged Condition: good  Hospital Course: Patient brought into [facility] for scheduled procedure. He tolerated procedure well.  We treated the patient is a 1 stage arthroplasty as there was a small amount of purulent appearing material behind the glenosphere and though there was synovitis consistent with infection there was not gross purulence within the joint itself.  We feel that there is a high likelihood of infection. Infectious disease was consulted for management.  He was kept for monitoring overnight for pain control, IV antibiotics, PICC line placement, and medical monitoring postop. He was found to be stable for DC home the morning after surgery.  Patient was instructed on specific activity restrictions and all questions were answered.  Consults: Infectious disease  Significant Diagnostic Studies: Cultures  Treatments: Surgery PICC line placement  Discharge Exam: General: sitting up in hospital bed, NAD LUE: Dressing with some bloody drainage. Dressing removed. Incision CDI. Nylon sutures in place. No active drainage. New aquacel placed.  sling well fitting,  full and painless ROM throughout hand with DPC of 0.  Axillary nerve sensation/motor altered in setting of block and unable to be fully tested.  Distal motor and sensory altered in setting of block. Warm well perfused hand.   Disposition: Discharge disposition: 01-Home or Self Care       Discharge Instructions     [facility] pharmacist to adjust dose for Vancomycin, Aminoglycosides and other anti-infective therapies as requested by physician.   Complete by: As directed    [facility] to provide Cath Flo 36m   Complete by: As directed    Administer for PICC line occlusion and as ordered by physician for other access device issues.   Anaphylaxis Kit: Provided to treat any anaphylactic reaction to the medication being provided to the patient if First Dose or when requested by physician   Complete by: As directed    Epinephrine 154mml vial / amp: Administer 0.31m68m0.31ml79mubcutaneously once for moderate to severe anaphylaxis, nurse to call physician and pharmacy when reaction occurs and call 911 if needed for immediate care   Diphenhydramine 50mg20mIV vial: Administer 25-50mg 60mM PRN for first dose reaction, rash, itching, mild reaction, nurse to call physician and pharmacy when reaction occurs   Sodium Chloride 0.9% NS 500ml I27mdminister if needed for hypovolemic blood pressure drop or as ordered by physician after call to physician with anaphylactic reaction   Call MD for:  redness, tenderness, or signs of infection (pain, swelling, redness, odor or green/yellow discharge around incision site)   Complete by: As directed    Call MD for:  severe uncontrolled pain   Complete by: As directed °  ° Call MD for:  temperature >100.4   Complete by: As directed °  ° Change dressing on IV access line weekly and PRN   Complete by: As directed °  ° Diet - low sodium heart healthy   Complete by: As directed °  ° Flush IV access with Sodium Chloride 0.9% and  Heparin 10 units/ml or 100 units/ml   Complete by: As directed °  ° Home infusion instructions - [facility]   Complete by: As directed °  ° Instructions: Flush IV access with Sodium Chloride 0.9% and Heparin 10units/ml or 100units/ml  ° Change dressing on IV access line: Weekly and PRN  ° Instructions Cath Flo 2mg: Administer for PICC Line occlusion and as ordered by physician for other access device  ° [facility] pharmacist to adjust dose for: Vancomycin, Aminoglycosides and other anti-infective therapies as requested by physician  ° Method of administration may be changed at the discretion of home infusion pharmacist based upon assessment of the patient and/or caregiver’s ability to self-administer the medication ordered   Complete by: As directed °  ° °  ° °Allergies as of 09/12/2021   ° °   Reactions  ° Lisinopril Cough  ° Statins Other (See Comments)  ° Muscle pain  ° °  ° °  °Medication List  °  ° °STOP taking these medications   ° °Acetaminophen 500 MG capsule °Replaced by: acetaminophen 500 MG tablet °  °aspirin 81 MG EC tablet °  °ibuprofen 800 MG tablet °Commonly known as: ADVIL °  °ondansetron 8 MG disintegrating tablet °Commonly known as: ZOFRAN-ODT °  ° °  ° °TAKE these medications   ° °acetaminophen 500 MG tablet °Commonly known as: TYLENOL °Take 2 tablets (1,000 mg total) by mouth every 8 (eight) hours for 14 days. °Replaces: Acetaminophen 500 MG capsule °  °amLODipine 5 MG tablet °Commonly known as: NORVASC °Take 1 tablet (5 mg total) by mouth daily. °  °cefTRIAXone  IVPB °Commonly known as: ROCEPHIN °Inject 2 g into the vein daily. Indication:  Shoulder PJI °First Dose: Yes °Last Day of Therapy:  10/01/21 °Labs - Once weekly:  CBC/D and BMP, °Labs - Every other week:  ESR and CRP °Method of administration: IV Push °Remove PICC at the completion of treatment °Method of administration may be changed at the discretion of home infusion pharmacist based upon assessment of the  patient and/or caregiver's ability to self-administer the medication ordered. °Start taking on: October 01, 2021 °  °docusate sodium 100 MG capsule °Commonly known as: COLACE °Take 200 mg by mouth every other day. °  °folic acid 1 MG tablet °Commonly known as: FOLVITE °Take 1 mg by mouth daily. °  °gabapentin 100 MG capsule °Commonly known as: NEURONTIN °Take 1 capsule (100 mg total) by mouth 3 (three) times daily. For pain °  °HYDROcodone-acetaminophen 7.5-325 MG tablet °Commonly known as: NORCO °Take 1 tablet by mouth every 8 (eight) hours as needed for pain. °What changed: Another medication with the same name was removed. Continue taking this medication, and follow the directions you see here. °  °loratadine 10 MG tablet °Commonly known as: CLARITIN °Take 10 mg by mouth daily. °  °metFORMIN 500 MG tablet °Commonly known as: GLUCOPHAGE °Take 500 mg by mouth daily with breakfast. °  °methotrexate 50 MG/2ML injection °Inject 20 mg into the muscle every Friday. °  °ondansetron 4 MG tablet °Commonly known as:   Zofran °Take 1 tablet (4 mg total) by mouth every 8 (eight) hours as needed for up to 7 days for nausea or vomiting. °  °oxyCODONE 5 MG immediate release tablet °Commonly known as: Oxy IR/ROXICODONE °Take 1-2 pills every 6 hrs as needed for severe pain, no more than 6 per day °  °predniSONE 5 MG tablet °Commonly known as: DELTASONE °Take 5 mg by mouth daily. °  °RENFLEXIS IV °Inject 1 Dose into the vein every 6 (six) weeks. °  °rivaroxaban 10 MG Tabs tablet °Commonly known as: Xarelto °Take 1 tablet (10 mg total) by mouth daily. For DVT prophylaxis after surgery °  °tamsulosin 0.4 MG Caps capsule °Commonly known as: FLOMAX °Take 0.4 mg by mouth at bedtime. °  ° °  ° °  °  ° ° °  °Discharge Care Instructions  °(From admission, onward)  °  ° ° °  ° °  Start     Ordered  ° 09/12/21 0000  Change dressing on IV access line weekly and PRN  (Home infusion instructions - [facility] )       ° 09/12/21 1302   ° °  °  ° °  ° ° Follow-up Information   ° ° Varkey, Dax T, MD Follow up on 09/20/2021.   °Specialty: Orthopedic Surgery °Why: @830 to recheck left shoulder °Contact information: °[address] °[address] °[place] [address] °[phone] ° ° °  °  ° ° [facility] Follow up.   °Why: [facility] will provide HHRN for labs and managing the PICC line. ° °  °  ° ° [facility] Follow up.   °Why: [facility] will provide IV antibiotics. ° °  °  ° °  °  ° °  ° ° ° , PA-C °09/13/2021 ° ° °

## 2021-09-14 DIAGNOSIS — M48061 Spinal stenosis, lumbar region without neurogenic claudication: Secondary | ICD-10-CM | POA: Diagnosis not present

## 2021-09-14 DIAGNOSIS — Z471 Aftercare following joint replacement surgery: Secondary | ICD-10-CM | POA: Diagnosis not present

## 2021-09-14 DIAGNOSIS — T84038D Mechanical loosening of other internal prosthetic joint, subsequent encounter: Secondary | ICD-10-CM | POA: Diagnosis not present

## 2021-09-14 DIAGNOSIS — H8113 Benign paroxysmal vertigo, bilateral: Secondary | ICD-10-CM | POA: Diagnosis not present

## 2021-09-14 DIAGNOSIS — Z452 Encounter for adjustment and management of vascular access device: Secondary | ICD-10-CM | POA: Diagnosis not present

## 2021-09-14 DIAGNOSIS — T8459XD Infection and inflammatory reaction due to other internal joint prosthesis, subsequent encounter: Secondary | ICD-10-CM | POA: Diagnosis not present

## 2021-09-14 DIAGNOSIS — I10 Essential (primary) hypertension: Secondary | ICD-10-CM | POA: Diagnosis not present

## 2021-09-14 DIAGNOSIS — Z5181 Encounter for therapeutic drug level monitoring: Secondary | ICD-10-CM | POA: Diagnosis not present

## 2021-09-14 DIAGNOSIS — M069 Rheumatoid arthritis, unspecified: Secondary | ICD-10-CM | POA: Diagnosis not present

## 2021-09-14 DIAGNOSIS — E119 Type 2 diabetes mellitus without complications: Secondary | ICD-10-CM | POA: Diagnosis not present

## 2021-09-14 DIAGNOSIS — Z792 Long term (current) use of antibiotics: Secondary | ICD-10-CM | POA: Diagnosis not present

## 2021-09-16 LAB — AEROBIC/ANAEROBIC CULTURE W GRAM STAIN (SURGICAL/DEEP WOUND)

## 2021-09-17 ENCOUNTER — Encounter (HOSPITAL_COMMUNITY): Payer: Self-pay

## 2021-09-17 ENCOUNTER — Emergency Department (HOSPITAL_COMMUNITY)
Admission: EM | Admit: 2021-09-17 | Discharge: 2021-09-17 | Disposition: A | Discharge status: Home / Self Care | Payer: Medicare HMO | Attending: Emergency Medicine | Admitting: Emergency Medicine

## 2021-09-17 ENCOUNTER — Other Ambulatory Visit: Payer: Self-pay

## 2021-09-17 DIAGNOSIS — Z7984 Long term (current) use of oral hypoglycemic drugs: Secondary | ICD-10-CM | POA: Insufficient documentation

## 2021-09-17 DIAGNOSIS — Z48817 Encounter for surgical aftercare following surgery on the skin and subcutaneous tissue: Secondary | ICD-10-CM | POA: Insufficient documentation

## 2021-09-17 DIAGNOSIS — Z7901 Long term (current) use of anticoagulants: Secondary | ICD-10-CM | POA: Diagnosis not present

## 2021-09-17 DIAGNOSIS — T84038D Mechanical loosening of other internal prosthetic joint, subsequent encounter: Secondary | ICD-10-CM | POA: Diagnosis not present

## 2021-09-17 DIAGNOSIS — H8113 Benign paroxysmal vertigo, bilateral: Secondary | ICD-10-CM | POA: Diagnosis not present

## 2021-09-17 DIAGNOSIS — M48061 Spinal stenosis, lumbar region without neurogenic claudication: Secondary | ICD-10-CM | POA: Diagnosis not present

## 2021-09-17 DIAGNOSIS — Z5189 Encounter for other specified aftercare: Secondary | ICD-10-CM

## 2021-09-17 DIAGNOSIS — I10 Essential (primary) hypertension: Secondary | ICD-10-CM | POA: Diagnosis not present

## 2021-09-17 DIAGNOSIS — Z452 Encounter for adjustment and management of vascular access device: Secondary | ICD-10-CM | POA: Diagnosis not present

## 2021-09-17 DIAGNOSIS — E119 Type 2 diabetes mellitus without complications: Secondary | ICD-10-CM | POA: Diagnosis not present

## 2021-09-17 DIAGNOSIS — Z5181 Encounter for therapeutic drug level monitoring: Secondary | ICD-10-CM | POA: Diagnosis not present

## 2021-09-17 DIAGNOSIS — Z792 Long term (current) use of antibiotics: Secondary | ICD-10-CM | POA: Diagnosis not present

## 2021-09-17 DIAGNOSIS — Z471 Aftercare following joint replacement surgery: Secondary | ICD-10-CM | POA: Diagnosis not present

## 2021-09-17 DIAGNOSIS — Z79899 Other long term (current) drug therapy: Secondary | ICD-10-CM | POA: Insufficient documentation

## 2021-09-17 DIAGNOSIS — T8459XD Infection and inflammatory reaction due to other internal joint prosthesis, subsequent encounter: Secondary | ICD-10-CM | POA: Diagnosis not present

## 2021-09-17 DIAGNOSIS — M069 Rheumatoid arthritis, unspecified: Secondary | ICD-10-CM | POA: Diagnosis not present

## 2021-09-17 DIAGNOSIS — Z4801 Encounter for change or removal of surgical wound dressing: Secondary | ICD-10-CM | POA: Diagnosis not present

## 2021-09-17 NOTE — ED Triage Notes (Addendum)
"  Had shoulder replacement last Wednesday and since then I have bled a little but today I bled through 2 dressings" per pt

## 2021-09-17 NOTE — ED Notes (Signed)
Sterile ABD pad and sterile mepilex dressing applied to left shoulder using sterile technique.

## 2021-09-17 NOTE — Discharge Instructions (Signed)
Please return for increased level of bleeding.  Please keep your appointment for Friday.

## 2021-09-17 NOTE — ED Notes (Signed)
Dressing moved by PA. Small opening in top of suture with scant amount of blood oozing.

## 2021-09-17 NOTE — ED Provider Notes (Signed)
[facility] DEPT Provider Note   CSN: [id number] Arrival date & time: 09/17/21  2105     History  Chief Complaint  Patient presents with   Post-op Problem    Willie Howe is a 61 y.o. male who presents to the emergency department for a wound check.  He states that he was bleeding through his pads.  Had recent shoulder surgery a week ago.  He is on blood thinners.  No fevers.  HPI     Home Medications Prior to Admission medications   Medication Sig Start Date End Date Taking? Authorizing Provider  acetaminophen (TYLENOL) 500 MG tablet Take 2 tablets (1,000 mg total) by mouth every 8 (eight) hours for 14 days. 09/12/21 09/26/21  Willie Chick, PA-C  amLODipine (NORVASC) 5 MG tablet Take 1 tablet (5 mg total) by mouth daily. 08/23/21   Satira Sark, MD  cefTRIAXone (ROCEPHIN) IVPB Inject 2 g into the vein daily. Indication:  Shoulder PJI First Dose: Yes Last Day of Therapy:  10/01/21 Labs - Once weekly:  CBC/D and BMP, Labs - Every other week:  ESR and CRP Method of administration: IV Push Remove PICC at the completion of treatment Method of administration may be changed at the discretion of home infusion pharmacist based upon assessment of the patient and/or caregiver's ability to self-administer the medication ordered. 10/01/21   McBane, Maylene Roes, PA-C  docusate sodium (COLACE) 100 MG capsule Take 200 mg by mouth every other day.    [provider]  folic acid (FOLVITE) 1 MG tablet Take 1 mg by mouth daily.    [provider]  gabapentin (NEURONTIN) 100 MG capsule Take 1 capsule (100 mg total) by mouth 3 (three) times daily. For pain 09/12/21   Willie Chick, PA-C  HYDROcodone-acetaminophen (NORCO) 7.5-325 MG tablet Take 1 tablet by mouth every 8 (eight) hours as needed for pain. 07/13/21   [provider]  inFLIXimab-abda (RENFLEXIS IV) Inject 1 Dose into the vein every 6 (six) weeks.    [provider]  loratadine (CLARITIN) 10 MG tablet Take 10 mg by mouth daily.    [provider]  metFORMIN (GLUCOPHAGE) 500 MG tablet Take 500 mg by mouth daily with breakfast.    [provider]  methotrexate 50 MG/2ML injection Inject 20 mg into the muscle every Friday. 01/05/19   [provider]  ondansetron (ZOFRAN) 4 MG tablet Take 1 tablet (4 mg total) by mouth every 8 (eight) hours as needed for up to 7 days for nausea or vomiting. 09/12/21 09/19/21  McBane, Maylene Roes, PA-C  predniSONE (DELTASONE) 5 MG tablet Take 5 mg by mouth daily.    [provider]  rivaroxaban (XARELTO) 10 MG TABS tablet Take 1 tablet (10 mg total) by mouth daily. For DVT prophylaxis after surgery 09/12/21 10/12/21  Willie Chick, PA-C  tamsulosin (FLOMAX) 0.4 MG CAPS capsule Take 0.4 mg by mouth at bedtime.     [provider]      Allergies    Lisinopril and Statins    Review of Systems   Review of Systems  All other systems reviewed and are negative.  Physical Exam Updated Vital Signs BP (!) 137/102 (BP Location: Right Wrist)    Pulse 60    Temp 98.6 F (37 C)    Resp 18    Ht 6' 3.5" (1.918 m)    Wt 122.5 kg    SpO2 95%  BMI 33.30 kg/m  Physical Exam Vitals and nursing note reviewed.  Constitutional:      Appearance: Normal appearance.  HENT:     Head: Normocephalic and atraumatic.  Eyes:     General:        Right eye: No discharge.        Left eye: No discharge.     Conjunctiva/sclera: Conjunctivae normal.  Pulmonary:     Effort: Pulmonary effort is normal.  Skin:    General: Skin is warm and dry.     Findings: No rash.     Comments: Well-healing surgical incision over the left anterior shoulder.  No signs of erythema or purulent drainage.  Not actively bleeding.  No firmness or obvious hematoma.  Neurological:     General: No focal deficit present.     Mental Status: He is alert.  Psychiatric:        Mood and Affect: Mood normal.        Behavior:  Behavior normal.    ED Results / Procedures / Treatments   Labs (all labs ordered are listed, but only abnormal results are displayed) Labs Reviewed - No data to display  EKG None  Radiology No results found.  Procedures Procedures    Medications Ordered in ED Medications - No data to display  ED Course/ Medical Decision Making/ A&P                           Medical Decision Making  [name] is a 61 y.o. male who presents to the emergency department for a wound check.  Wound does not appear infected.  No overlying hematoma or abscess formation.  Dressings were applied and he was given plenty of supplies to take home.  He has a follow-up with his orthopedist on Friday.  I told him to keep that appointment.  Appropriate wound care was discussed.  He is safe for discharge.  Final Clinical Impression(s) / ED Diagnoses Final diagnoses:  Visit for wound check    Rx / DC Orders ED Discharge Orders     None         Cherrie Gauze 09/17/21 2201    Regan Lemming, MD 09/18/21 [phone]

## 2021-09-18 DIAGNOSIS — R972 Elevated prostate specific antigen [PSA]: Secondary | ICD-10-CM | POA: Diagnosis not present

## 2021-09-18 DIAGNOSIS — Z0000 Encounter for general adult medical examination without abnormal findings: Secondary | ICD-10-CM | POA: Diagnosis not present

## 2021-09-18 DIAGNOSIS — E119 Type 2 diabetes mellitus without complications: Secondary | ICD-10-CM | POA: Diagnosis not present

## 2021-09-18 DIAGNOSIS — I10 Essential (primary) hypertension: Secondary | ICD-10-CM | POA: Diagnosis not present

## 2021-09-18 DIAGNOSIS — Z79899 Other long term (current) drug therapy: Secondary | ICD-10-CM | POA: Diagnosis not present

## 2021-09-18 DIAGNOSIS — T466X5A Adverse effect of antihyperlipidemic and antiarteriosclerotic drugs, initial encounter: Secondary | ICD-10-CM | POA: Diagnosis not present

## 2021-09-18 DIAGNOSIS — M069 Rheumatoid arthritis, unspecified: Secondary | ICD-10-CM | POA: Diagnosis not present

## 2021-09-18 DIAGNOSIS — F1911 Other psychoactive substance abuse, in remission: Secondary | ICD-10-CM | POA: Diagnosis not present

## 2021-09-18 DIAGNOSIS — D84821 Immunodeficiency due to drugs: Secondary | ICD-10-CM | POA: Diagnosis not present

## 2021-09-18 DIAGNOSIS — R69 Illness, unspecified: Secondary | ICD-10-CM | POA: Diagnosis not present

## 2021-09-18 DIAGNOSIS — Z7984 Long term (current) use of oral hypoglycemic drugs: Secondary | ICD-10-CM | POA: Diagnosis not present

## 2021-09-18 DIAGNOSIS — M791 Myalgia, unspecified site: Secondary | ICD-10-CM | POA: Diagnosis not present

## 2021-09-18 DIAGNOSIS — R21 Rash and other nonspecific skin eruption: Secondary | ICD-10-CM | POA: Diagnosis not present

## 2021-09-18 DIAGNOSIS — L602 Onychogryphosis: Secondary | ICD-10-CM | POA: Diagnosis not present

## 2021-09-19 DIAGNOSIS — M25512 Pain in left shoulder: Secondary | ICD-10-CM | POA: Diagnosis not present

## 2021-09-19 DIAGNOSIS — Z09 Encounter for follow-up examination after completed treatment for conditions other than malignant neoplasm: Secondary | ICD-10-CM | POA: Diagnosis not present

## 2021-09-19 DIAGNOSIS — Z0000 Encounter for general adult medical examination without abnormal findings: Secondary | ICD-10-CM | POA: Diagnosis not present

## 2021-09-19 LAB — AEROBIC/ANAEROBIC CULTURE W GRAM STAIN (SURGICAL/DEEP WOUND)

## 2021-09-20 DIAGNOSIS — Z96612 Presence of left artificial shoulder joint: Secondary | ICD-10-CM | POA: Diagnosis not present

## 2021-09-20 DIAGNOSIS — T847XXA Infection and inflammatory reaction due to other internal orthopedic prosthetic devices, implants and grafts, initial encounter: Secondary | ICD-10-CM | POA: Diagnosis not present

## 2021-09-21 DIAGNOSIS — T8450XD Infection and inflammatory reaction due to unspecified internal joint prosthesis, subsequent encounter: Secondary | ICD-10-CM | POA: Diagnosis not present

## 2021-09-21 DIAGNOSIS — T8459XA Infection and inflammatory reaction due to other internal joint prosthesis, initial encounter: Secondary | ICD-10-CM | POA: Diagnosis not present

## 2021-09-21 DIAGNOSIS — Z96612 Presence of left artificial shoulder joint: Secondary | ICD-10-CM | POA: Diagnosis not present

## 2021-09-23 DIAGNOSIS — Z452 Encounter for adjustment and management of vascular access device: Secondary | ICD-10-CM | POA: Diagnosis not present

## 2021-09-23 DIAGNOSIS — T8459XD Infection and inflammatory reaction due to other internal joint prosthesis, subsequent encounter: Secondary | ICD-10-CM | POA: Diagnosis not present

## 2021-09-23 DIAGNOSIS — H8113 Benign paroxysmal vertigo, bilateral: Secondary | ICD-10-CM | POA: Diagnosis not present

## 2021-09-23 DIAGNOSIS — Z792 Long term (current) use of antibiotics: Secondary | ICD-10-CM | POA: Diagnosis not present

## 2021-09-23 DIAGNOSIS — T84038D Mechanical loosening of other internal prosthetic joint, subsequent encounter: Secondary | ICD-10-CM | POA: Diagnosis not present

## 2021-09-23 DIAGNOSIS — Z5181 Encounter for therapeutic drug level monitoring: Secondary | ICD-10-CM | POA: Diagnosis not present

## 2021-09-23 DIAGNOSIS — M48061 Spinal stenosis, lumbar region without neurogenic claudication: Secondary | ICD-10-CM | POA: Diagnosis not present

## 2021-09-23 DIAGNOSIS — M069 Rheumatoid arthritis, unspecified: Secondary | ICD-10-CM | POA: Diagnosis not present

## 2021-09-23 DIAGNOSIS — E119 Type 2 diabetes mellitus without complications: Secondary | ICD-10-CM | POA: Diagnosis not present

## 2021-09-23 DIAGNOSIS — I10 Essential (primary) hypertension: Secondary | ICD-10-CM | POA: Diagnosis not present

## 2021-09-26 ENCOUNTER — Encounter: Payer: Self-pay | Admitting: Infectious Diseases

## 2021-09-26 ENCOUNTER — Ambulatory Visit: Payer: Medicare HMO | Admitting: Infectious Diseases

## 2021-09-26 ENCOUNTER — Telehealth: Payer: Self-pay

## 2021-09-26 ENCOUNTER — Other Ambulatory Visit: Payer: Self-pay

## 2021-09-26 DIAGNOSIS — T8459XD Infection and inflammatory reaction due to other internal joint prosthesis, subsequent encounter: Secondary | ICD-10-CM | POA: Diagnosis not present

## 2021-09-26 DIAGNOSIS — Z96612 Presence of left artificial shoulder joint: Secondary | ICD-10-CM

## 2021-09-26 DIAGNOSIS — Z452 Encounter for adjustment and management of vascular access device: Secondary | ICD-10-CM | POA: Diagnosis not present

## 2021-09-26 DIAGNOSIS — M05762 Rheumatoid arthritis with rheumatoid factor of left knee without organ or systems involvement: Secondary | ICD-10-CM | POA: Diagnosis not present

## 2021-09-26 DIAGNOSIS — N200 Calculus of kidney: Secondary | ICD-10-CM | POA: Diagnosis not present

## 2021-09-26 DIAGNOSIS — Z96619 Presence of unspecified artificial shoulder joint: Secondary | ICD-10-CM

## 2021-09-26 DIAGNOSIS — R972 Elevated prostate specific antigen [PSA]: Secondary | ICD-10-CM | POA: Diagnosis not present

## 2021-09-26 MED ORDER — DOXYCYCLINE HYCLATE 100 MG PO CAPS: 100.0000 mg | ORAL_CAPSULE | Freq: Two times a day (BID) | ORAL | 0 refills | Status: AC

## 2021-09-26 NOTE — Assessment & Plan Note (Signed)
Recovering nicely from surgery. Feels much better overall.

## 2021-09-26 NOTE — Telephone Encounter (Signed)
Called [facility] to give verbal orders per Janene Madeira, FNP to stop IV antibiotics and to pull picc today 09/26/20. I spoke to Amy at [facility] and she verbalized understanding. Adelfa Koh, CMA

## 2021-09-26 NOTE — Assessment & Plan Note (Signed)
Stopping ceftriaxone - will have home health pull picc as it likely has a fibrin clot in the tip of the catheter and not working well. No signs of distal extremity swelling or concern for clinical DVT.

## 2021-09-26 NOTE — Patient Instructions (Addendum)
We will call your home health team to stop the antibiotic through your IV   Will START a medication called Doxycycline - this is one pill twice a day. Best to take 30 min after non-dairy food to help with nausea. Full glass of water to ensure it goes down.   Careful in the sun - the antibiotic can make you more sensitive to burns.   Will have you back in 4 weeks to see how you are doing with Dr. Linus Salmons.

## 2021-09-26 NOTE — Assessment & Plan Note (Signed)
[name] and his wife are here for 2 week follow up after reverse total shoulder revision and treatment with IV ceftriaxone for c. Acnes that was grown from all operative samples from surgery. He is doing quite well and notes a big improvement in how he has felt. Good appetite, no fevers or chills and pain is substantially better. CRP has fluctuated a little but clinically he has improved a lot.  Trouble with PICC line malfunctioning that has caused him to miss a dose or two of antibiotic. He has had good surgical control, 2 weeks of targeted IV antibiotics and clinically doing very well; we all feel very comfortable switching him to ongoing PO antibiotics. [name] preferred twice daily option of doxycycline vs TID amoxicillin.  Counseled on new medication and possible side effects.   He will return in 4 weeks prior to stopping to see Dr. Linus Salmons again in follow up.

## 2021-09-26 NOTE — Progress Notes (Signed)
Patient: Willie Howe  DOB: 1960-12-04 MRN: [id number] PCP: Nickola Major, MD  Referring Provider: Griffin Basil   Chief Complaint  Patient presents with   Hospitalization Follow-up      Subjective:  Willie Howe is a 61 y.o. male with infection of shoulder implant of his reverse total shoulder. Recently with revision surgery on 09/11/21.   Started on IV ceftriaxone 2 gm via PICC line QD for 2 weeks with end date on 2/14 scheduled. He has had a hard time with PICC line lately and was not able to infuse it yesterday at all and missed the ceftriaxone dose. Has been troubleshooting with home health team.   He says he feels 100% better compared to before surgery; some pain as expected but very different and tolerable. No fevers/chills. Good appetite. Recovering well. Maybe some skin flaking from the antibiotic but otherwise no problem with other side effects. Incision is healing up nicely.    Sed Rate (mm/hr)  Date Value  09/11/2021 12  07/22/2021 18 (H)  08/06/2015 4   CRP (mg/dL)  Date Value  09/11/2021 0.9  07/22/2021 1.0 (H)  08/06/2015 <0.5    Review of Systems  Constitutional:  Negative for chills and fever.  HENT:  Negative for tinnitus.   Eyes:  Negative for blurred vision and photophobia.  Respiratory:  Negative for cough and sputum production.   Cardiovascular:  Negative for chest pain.  Gastrointestinal:  Negative for diarrhea, nausea and vomiting.  Genitourinary:  Negative for dysuria.  Musculoskeletal:  Positive for joint pain (nearly 100% better, expected post op shouder discomfort).  Skin:  Negative for rash.  Neurological:  Negative for headaches.   Past Medical History:  Diagnosis Date   BPPV (benign paroxysmal positional vertigo)    DVT (deep venous thrombosis) (HCC)    Graves disease 1996   Graves disease    hx of   History of kidney stones    History of pulmonary embolism    Hypertension    Obesity    Rheumatoid arthritis (HCC)    Sleep  apnea    Intolerant of CPAP   Type 2 diabetes mellitus ([place])     Outpatient Medications Prior to Visit  Medication Sig Dispense Refill   acetaminophen (TYLENOL) 500 MG tablet Take 2 tablets (1,000 mg total) by mouth every 8 (eight) hours for 14 days. 84 tablet 0   amLODipine (NORVASC) 5 MG tablet Take 1 tablet (5 mg total) by mouth daily. 90 tablet 2   docusate sodium (COLACE) 100 MG capsule Take 200 mg by mouth every other day.     folic acid (FOLVITE) 1 MG tablet Take 1 mg by mouth daily.     gabapentin (NEURONTIN) 100 MG capsule Take 1 capsule (100 mg total) by mouth 3 (three) times daily. For pain 90 capsule 0   HYDROcodone-acetaminophen (NORCO) 7.5-325 MG tablet Take 1 tablet by mouth every 8 (eight) hours as needed for pain.     inFLIXimab-abda (RENFLEXIS IV) Inject 1 Dose into the vein every 6 (six) weeks.     loratadine (CLARITIN) 10 MG tablet Take 10 mg by mouth daily.     metFORMIN (GLUCOPHAGE) 500 MG tablet Take 500 mg by mouth daily with breakfast.     rivaroxaban (XARELTO) 10 MG TABS tablet Take 1 tablet (10 mg total) by mouth daily. For DVT prophylaxis after surgery 30 tablet 0   tamsulosin (FLOMAX) 0.4 MG CAPS capsule Take 0.4 mg by mouth  at bedtime.      [START ON 10/01/2021] cefTRIAXone (ROCEPHIN) IVPB Inject 2 g into the vein daily. Indication:  Shoulder PJI First Dose: Yes Last Day of Therapy:  10/01/21 Labs - Once weekly:  CBC/D and BMP, Labs - Every other week:  ESR and CRP Method of administration: IV Push Remove PICC at the completion of treatment Method of administration may be changed at the discretion of home infusion pharmacist based upon assessment of the patient and/or caregiver's ability to self-administer the medication ordered. 21 Units 0   methotrexate 50 MG/2ML injection Inject 20 mg into the muscle every Friday. (Patient not taking: Reported on 09/26/2021)     predniSONE (DELTASONE) 5 MG tablet Take 5 mg by mouth daily. (Patient not taking: Reported on  09/26/2021)     No facility-administered medications prior to visit.     Allergies  Allergen Reactions   Lisinopril Cough   Statins Other (See Comments)    Muscle pain    Social History   Tobacco Use   Smoking status: Former    Packs/day: 1.50    Years: 20.00    Pack years: 30.00    Types: Cigarettes    Quit date: 02/03/1995    Years since quitting: 26.6   Smokeless tobacco: Never  Vaping Use   Vaping Use: Never used  Substance Use Topics   Alcohol use: No    Alcohol/week: 0.0 standard drinks   Drug use: No    Family History  Problem Relation Age of Onset   Deep vein thrombosis Mother    Hypertension Mother    Breast cancer Mother    Hypertension Father    Prostate cancer Father    CAD Sister     Objective:   Vitals:   09/26/21 0857  BP: (!) 147/94  Pulse: (!) 47  Temp: 97.6 F (36.4 C)  TempSrc: Temporal  Weight: 272 lb (123.4 kg)   Body mass index is 33.55 kg/m.  Physical Exam Vitals reviewed.  Constitutional:      Appearance: Normal appearance. He is not ill-appearing.  HENT:     Head: Normocephalic.     Mouth/Throat:     Mouth: Mucous membranes are moist.     Pharynx: Oropharynx is clear.  Eyes:     General: No scleral icterus. Pulmonary:     Effort: Pulmonary effort is normal.  Musculoskeletal:        General: Normal range of motion.     Cervical back: Normal range of motion.     Comments: Left shoulder in sling.   Skin:    General: Skin is warm and dry.     Coloration: Skin is not jaundiced or pale.     Comments: RUE PICC line dressing is intact. Does not sound like it is leaking. Likely small clot on the end preventing from infusion.   Neurological:     Mental Status: He is alert and oriented to person, place, and time.  Psychiatric:        Mood and Affect: Mood normal.        Judgment: Judgment normal.    Lab Results: Lab Results  Component Value Date   WBC 11.5 (H) 09/12/2021   HGB 9.5 (L) 09/12/2021   HCT 30.1 (L)  09/12/2021   MCV 88.8 09/12/2021   PLT 236 09/12/2021    Lab Results  Component Value Date   CREATININE 0.83 09/12/2021   BUN 14 09/12/2021   NA 131 (L) 09/12/2021   K  3.7 09/12/2021   CL 101 09/12/2021   CO2 23 09/12/2021    Lab Results  Component Value Date   ALT 26 11/27/2019   AST 24 11/27/2019   ALKPHOS 86 11/27/2019   BILITOT 0.3 11/27/2019     Assessment & Plan:   Problem List Items Addressed This Visit       Unprioritized   Rheumatoid arthritis ([place])    Would continue to hold the methotrexate and prednisone while undergoing treatment for infection.       Status post reverse total replacement of left shoulder    Recovering nicely from surgery. Feels much better overall.       Infection of prosthetic shoulder joint [facility])    [name] and his wife are here for 2 week follow up after reverse total shoulder revision and treatment with IV ceftriaxone for c. Acnes that was grown from all operative samples from surgery. He is doing quite well and notes a big improvement in how he has felt. Good appetite, no fevers or chills and pain is substantially better. CRP has fluctuated a little but clinically he has improved a lot.  Trouble with PICC line malfunctioning that has caused him to miss a dose or two of antibiotic. He has had good surgical control, 2 weeks of targeted IV antibiotics and clinically doing very well; we all feel very comfortable switching him to ongoing PO antibiotics. [name] preferred twice daily option of doxycycline vs TID amoxicillin.  Counseled on new medication and possible side effects.   He will return in 4 weeks prior to stopping to see Dr. Linus Salmons again in follow up.       PICC (peripherally inserted central catheter) in place    Stopping ceftriaxone - will have home health pull picc as it likely has a fibrin clot in the tip of the catheter and not working well. No signs of distal extremity swelling or concern for clinical DVT.       Total face  to face and non face to face encounter time: 40 min reviewing hospitalization, outpatient labs, face to face discussion and evaluation.   Janene Madeira, MSN, NP-C [facility] Pager: [phone] Office: [phone]  09/26/21  12:13 PM

## 2021-09-26 NOTE — Assessment & Plan Note (Signed)
Would continue to hold the methotrexate and prednisone while undergoing treatment for infection.

## 2021-09-26 NOTE — Telephone Encounter (Signed)
Ann with [facility] called for verbal okay to pull patient PICC line tomorrow 09/27/21. Per Janene Madeira, NP verbal okay given. Ann voiced her understanding.    [place], CMA

## 2021-09-27 DIAGNOSIS — Z5181 Encounter for therapeutic drug level monitoring: Secondary | ICD-10-CM | POA: Diagnosis not present

## 2021-09-27 DIAGNOSIS — Z792 Long term (current) use of antibiotics: Secondary | ICD-10-CM | POA: Diagnosis not present

## 2021-09-27 DIAGNOSIS — T84038D Mechanical loosening of other internal prosthetic joint, subsequent encounter: Secondary | ICD-10-CM | POA: Diagnosis not present

## 2021-09-27 DIAGNOSIS — Z452 Encounter for adjustment and management of vascular access device: Secondary | ICD-10-CM | POA: Diagnosis not present

## 2021-09-27 DIAGNOSIS — H8113 Benign paroxysmal vertigo, bilateral: Secondary | ICD-10-CM | POA: Diagnosis not present

## 2021-09-27 DIAGNOSIS — I10 Essential (primary) hypertension: Secondary | ICD-10-CM | POA: Diagnosis not present

## 2021-09-27 DIAGNOSIS — E119 Type 2 diabetes mellitus without complications: Secondary | ICD-10-CM | POA: Diagnosis not present

## 2021-09-27 DIAGNOSIS — T8459XD Infection and inflammatory reaction due to other internal joint prosthesis, subsequent encounter: Secondary | ICD-10-CM | POA: Diagnosis not present

## 2021-09-27 DIAGNOSIS — M069 Rheumatoid arthritis, unspecified: Secondary | ICD-10-CM | POA: Diagnosis not present

## 2021-09-27 DIAGNOSIS — M48061 Spinal stenosis, lumbar region without neurogenic claudication: Secondary | ICD-10-CM | POA: Diagnosis not present

## 2021-09-27 NOTE — Telephone Encounter (Signed)
Thank you :)

## 2021-09-28 DIAGNOSIS — Z96612 Presence of left artificial shoulder joint: Secondary | ICD-10-CM | POA: Diagnosis not present

## 2021-09-28 DIAGNOSIS — T8459XA Infection and inflammatory reaction due to other internal joint prosthesis, initial encounter: Secondary | ICD-10-CM | POA: Diagnosis not present

## 2021-09-28 DIAGNOSIS — T8450XD Infection and inflammatory reaction due to unspecified internal joint prosthesis, subsequent encounter: Secondary | ICD-10-CM | POA: Diagnosis not present

## 2021-10-04 DIAGNOSIS — Z79899 Other long term (current) drug therapy: Secondary | ICD-10-CM | POA: Diagnosis not present

## 2021-10-04 DIAGNOSIS — M25512 Pain in left shoulder: Secondary | ICD-10-CM | POA: Diagnosis not present

## 2021-10-04 DIAGNOSIS — G894 Chronic pain syndrome: Secondary | ICD-10-CM | POA: Diagnosis not present

## 2021-10-04 DIAGNOSIS — E559 Vitamin D deficiency, unspecified: Secondary | ICD-10-CM | POA: Diagnosis not present

## 2021-10-04 DIAGNOSIS — E119 Type 2 diabetes mellitus without complications: Secondary | ICD-10-CM | POA: Diagnosis not present

## 2021-10-04 DIAGNOSIS — Z6833 Body mass index (BMI) 33.0-33.9, adult: Secondary | ICD-10-CM | POA: Diagnosis not present

## 2021-10-04 DIAGNOSIS — R030 Elevated blood-pressure reading, without diagnosis of hypertension: Secondary | ICD-10-CM | POA: Diagnosis not present

## 2021-10-08 DIAGNOSIS — M25512 Pain in left shoulder: Secondary | ICD-10-CM | POA: Diagnosis not present

## 2021-10-10 LAB — FUNGUS CULTURE WITH STAIN

## 2021-10-10 LAB — FUNGUS CULTURE RESULT

## 2021-10-10 LAB — FUNGAL ORGANISM REFLEX

## 2021-10-11 DIAGNOSIS — Z471 Aftercare following joint replacement surgery: Secondary | ICD-10-CM | POA: Diagnosis not present

## 2021-10-11 DIAGNOSIS — Z96612 Presence of left artificial shoulder joint: Secondary | ICD-10-CM | POA: Diagnosis not present

## 2021-10-11 DIAGNOSIS — M25512 Pain in left shoulder: Secondary | ICD-10-CM | POA: Diagnosis not present

## 2021-10-11 DIAGNOSIS — M25612 Stiffness of left shoulder, not elsewhere classified: Secondary | ICD-10-CM | POA: Diagnosis not present

## 2021-10-11 DIAGNOSIS — M6281 Muscle weakness (generalized): Secondary | ICD-10-CM | POA: Diagnosis not present

## 2021-10-14 DIAGNOSIS — N400 Enlarged prostate without lower urinary tract symptoms: Secondary | ICD-10-CM | POA: Diagnosis not present

## 2021-10-14 DIAGNOSIS — Z604 Social exclusion and rejection: Secondary | ICD-10-CM | POA: Diagnosis not present

## 2021-10-14 DIAGNOSIS — M069 Rheumatoid arthritis, unspecified: Secondary | ICD-10-CM | POA: Diagnosis not present

## 2021-10-14 DIAGNOSIS — F1421 Cocaine dependence, in remission: Secondary | ICD-10-CM | POA: Diagnosis not present

## 2021-10-14 DIAGNOSIS — Z7984 Long term (current) use of oral hypoglycemic drugs: Secondary | ICD-10-CM | POA: Diagnosis not present

## 2021-10-14 DIAGNOSIS — Z79631 Long term (current) use of antimetabolite agent: Secondary | ICD-10-CM | POA: Diagnosis not present

## 2021-10-14 DIAGNOSIS — R69 Illness, unspecified: Secondary | ICD-10-CM | POA: Diagnosis not present

## 2021-10-14 DIAGNOSIS — E119 Type 2 diabetes mellitus without complications: Secondary | ICD-10-CM | POA: Diagnosis not present

## 2021-10-14 DIAGNOSIS — Z8249 Family history of ischemic heart disease and other diseases of the circulatory system: Secondary | ICD-10-CM | POA: Diagnosis not present

## 2021-10-14 DIAGNOSIS — F1121 Opioid dependence, in remission: Secondary | ICD-10-CM | POA: Diagnosis not present

## 2021-10-14 DIAGNOSIS — F1021 Alcohol dependence, in remission: Secondary | ICD-10-CM | POA: Diagnosis not present

## 2021-10-14 DIAGNOSIS — Z833 Family history of diabetes mellitus: Secondary | ICD-10-CM | POA: Diagnosis not present

## 2021-10-14 DIAGNOSIS — I10 Essential (primary) hypertension: Secondary | ICD-10-CM | POA: Diagnosis not present

## 2021-10-16 DIAGNOSIS — M25612 Stiffness of left shoulder, not elsewhere classified: Secondary | ICD-10-CM | POA: Diagnosis not present

## 2021-10-16 DIAGNOSIS — Z96612 Presence of left artificial shoulder joint: Secondary | ICD-10-CM | POA: Diagnosis not present

## 2021-10-16 DIAGNOSIS — M6281 Muscle weakness (generalized): Secondary | ICD-10-CM | POA: Diagnosis not present

## 2021-10-16 DIAGNOSIS — Z471 Aftercare following joint replacement surgery: Secondary | ICD-10-CM | POA: Diagnosis not present

## 2021-10-16 DIAGNOSIS — M25512 Pain in left shoulder: Secondary | ICD-10-CM | POA: Diagnosis not present

## 2021-10-17 DIAGNOSIS — Z6835 Body mass index (BMI) 35.0-35.9, adult: Secondary | ICD-10-CM | POA: Diagnosis not present

## 2021-10-17 DIAGNOSIS — M0589 Other rheumatoid arthritis with rheumatoid factor of multiple sites: Secondary | ICD-10-CM | POA: Diagnosis not present

## 2021-10-17 DIAGNOSIS — E669 Obesity, unspecified: Secondary | ICD-10-CM | POA: Diagnosis not present

## 2021-10-17 DIAGNOSIS — M25532 Pain in left wrist: Secondary | ICD-10-CM | POA: Diagnosis not present

## 2021-10-17 DIAGNOSIS — M150 Primary generalized (osteo)arthritis: Secondary | ICD-10-CM | POA: Diagnosis not present

## 2021-10-17 DIAGNOSIS — Z79899 Other long term (current) drug therapy: Secondary | ICD-10-CM | POA: Diagnosis not present

## 2021-10-23 DIAGNOSIS — M25612 Stiffness of left shoulder, not elsewhere classified: Secondary | ICD-10-CM | POA: Diagnosis not present

## 2021-10-23 DIAGNOSIS — Z96612 Presence of left artificial shoulder joint: Secondary | ICD-10-CM | POA: Diagnosis not present

## 2021-10-23 DIAGNOSIS — Z471 Aftercare following joint replacement surgery: Secondary | ICD-10-CM | POA: Diagnosis not present

## 2021-10-23 DIAGNOSIS — M6281 Muscle weakness (generalized): Secondary | ICD-10-CM | POA: Diagnosis not present

## 2021-10-23 DIAGNOSIS — M25512 Pain in left shoulder: Secondary | ICD-10-CM | POA: Diagnosis not present

## 2021-10-25 ENCOUNTER — Encounter: Payer: Self-pay | Admitting: Internal Medicine

## 2021-10-25 ENCOUNTER — Other Ambulatory Visit: Payer: Self-pay

## 2021-10-25 ENCOUNTER — Ambulatory Visit (INDEPENDENT_AMBULATORY_CARE_PROVIDER_SITE_OTHER): Payer: Medicare HMO | Admitting: Internal Medicine

## 2021-10-25 DIAGNOSIS — Z96619 Presence of unspecified artificial shoulder joint: Secondary | ICD-10-CM

## 2021-10-25 DIAGNOSIS — Z452 Encounter for adjustment and management of vascular access device: Secondary | ICD-10-CM

## 2021-10-25 DIAGNOSIS — M05762 Rheumatoid arthritis with rheumatoid factor of left knee without organ or systems involvement: Secondary | ICD-10-CM | POA: Diagnosis not present

## 2021-10-25 DIAGNOSIS — Z96612 Presence of left artificial shoulder joint: Secondary | ICD-10-CM | POA: Diagnosis not present

## 2021-10-25 DIAGNOSIS — T8459XD Infection and inflammatory reaction due to other internal joint prosthesis, subsequent encounter: Secondary | ICD-10-CM | POA: Diagnosis not present

## 2021-10-25 LAB — ACID FAST CULTURE WITH REFLEXED SENSITIVITIES (MYCOBACTERIA)
Acid Fast Culture: NEGATIVE
Acid Fast Culture: NEGATIVE

## 2021-10-25 NOTE — Assessment & Plan Note (Signed)
Clinically appears resolved with good movement of his shoulder and he is in rehab with no complaints.  At this point, he has been adequately treated and I discussed the test of cure is to observe off of antibiotics.  ?He will return as needed ?

## 2021-10-25 NOTE — Assessment & Plan Note (Signed)
This has been removed and he has had no issues at the insertion site.   ?

## 2021-10-25 NOTE — Progress Notes (Signed)
? ?  Subjective:  ? ? Patient ID: Willie Howe, male    DOB: 1960-11-13, 61 y.o.   MRN: [id number] ? ?HPI ?Here for follow up of left should implant infection ?He is s/p revision surgery on 09/11/21 and cultures with C acnes and has been on treatment for 6 weeks, intially with IV ceftriaxone then with doxycyline for continuation and doing well.  Feels 1000% better!  Did not have any issues with the doxycycline.  ? ? ?Review of Systems  ?Constitutional:  Negative for chills and fever.  ?Gastrointestinal:  Negative for diarrhea.  ?Skin:  Negative for rash.  ? ?   ?Objective:  ? Physical Exam ?Skin: ?   Findings: No rash.  ?Neurological:  ?   Mental Status: He is alert.  ? ? ?SH: no tobacco ? ? ?   ?Assessment & Plan:  ? ? ?

## 2021-10-25 NOTE — Assessment & Plan Note (Signed)
At this point, his shoulder infection is resolved and he is clear to go back on his immunomodulators as indicated by Dr. Trudie Reed from an ID standpoint.   ?

## 2021-10-25 NOTE — Patient Instructions (Signed)
No further antibiotic treatment needed so you can remain off antibiotics. ? ?You can follow up with Dr. Trudie Reed and discuss with her restarting your infusions.  She will receive a copy of the note from today's visit. ? ? ?

## 2021-10-30 DIAGNOSIS — M25612 Stiffness of left shoulder, not elsewhere classified: Secondary | ICD-10-CM | POA: Diagnosis not present

## 2021-10-30 DIAGNOSIS — M25512 Pain in left shoulder: Secondary | ICD-10-CM | POA: Diagnosis not present

## 2021-10-30 DIAGNOSIS — Z96612 Presence of left artificial shoulder joint: Secondary | ICD-10-CM | POA: Diagnosis not present

## 2021-10-30 DIAGNOSIS — M6281 Muscle weakness (generalized): Secondary | ICD-10-CM | POA: Diagnosis not present

## 2021-10-30 DIAGNOSIS — Z471 Aftercare following joint replacement surgery: Secondary | ICD-10-CM | POA: Diagnosis not present

## 2021-11-01 DIAGNOSIS — Z6833 Body mass index (BMI) 33.0-33.9, adult: Secondary | ICD-10-CM | POA: Diagnosis not present

## 2021-11-01 DIAGNOSIS — R030 Elevated blood-pressure reading, without diagnosis of hypertension: Secondary | ICD-10-CM | POA: Diagnosis not present

## 2021-11-01 DIAGNOSIS — E559 Vitamin D deficiency, unspecified: Secondary | ICD-10-CM | POA: Diagnosis not present

## 2021-11-01 DIAGNOSIS — Z79899 Other long term (current) drug therapy: Secondary | ICD-10-CM | POA: Diagnosis not present

## 2021-11-01 DIAGNOSIS — M25512 Pain in left shoulder: Secondary | ICD-10-CM | POA: Diagnosis not present

## 2021-11-01 DIAGNOSIS — G894 Chronic pain syndrome: Secondary | ICD-10-CM | POA: Diagnosis not present

## 2021-11-01 DIAGNOSIS — E119 Type 2 diabetes mellitus without complications: Secondary | ICD-10-CM | POA: Diagnosis not present

## 2021-11-05 ENCOUNTER — Ambulatory Visit: Payer: Medicare HMO | Admitting: Orthopedic Surgery

## 2021-11-05 DIAGNOSIS — Z79899 Other long term (current) drug therapy: Secondary | ICD-10-CM | POA: Diagnosis not present

## 2021-11-05 DIAGNOSIS — M0589 Other rheumatoid arthritis with rheumatoid factor of multiple sites: Secondary | ICD-10-CM | POA: Diagnosis not present

## 2021-11-06 DIAGNOSIS — M25512 Pain in left shoulder: Secondary | ICD-10-CM | POA: Diagnosis not present

## 2021-11-06 DIAGNOSIS — M6281 Muscle weakness (generalized): Secondary | ICD-10-CM | POA: Diagnosis not present

## 2021-11-06 DIAGNOSIS — Z96612 Presence of left artificial shoulder joint: Secondary | ICD-10-CM | POA: Diagnosis not present

## 2021-11-06 DIAGNOSIS — M25612 Stiffness of left shoulder, not elsewhere classified: Secondary | ICD-10-CM | POA: Diagnosis not present

## 2021-11-06 DIAGNOSIS — Z471 Aftercare following joint replacement surgery: Secondary | ICD-10-CM | POA: Diagnosis not present

## 2021-11-11 ENCOUNTER — Other Ambulatory Visit: Payer: Self-pay | Admitting: Orthopedic Surgery

## 2021-11-11 DIAGNOSIS — S46012A Strain of muscle(s) and tendon(s) of the rotator cuff of left shoulder, initial encounter: Secondary | ICD-10-CM

## 2021-11-15 LAB — ACID FAST CULTURE WITH REFLEXED SENSITIVITIES (MYCOBACTERIA): Acid Fast Culture: NEGATIVE

## 2021-11-20 DIAGNOSIS — M25512 Pain in left shoulder: Secondary | ICD-10-CM | POA: Diagnosis not present

## 2021-11-20 DIAGNOSIS — Z96612 Presence of left artificial shoulder joint: Secondary | ICD-10-CM | POA: Diagnosis not present

## 2021-11-20 DIAGNOSIS — Z471 Aftercare following joint replacement surgery: Secondary | ICD-10-CM | POA: Diagnosis not present

## 2021-11-20 DIAGNOSIS — M25612 Stiffness of left shoulder, not elsewhere classified: Secondary | ICD-10-CM | POA: Diagnosis not present

## 2021-11-20 DIAGNOSIS — M6281 Muscle weakness (generalized): Secondary | ICD-10-CM | POA: Diagnosis not present

## 2021-11-21 ENCOUNTER — Ambulatory Visit: Payer: Medicare HMO | Admitting: Cardiology

## 2021-11-22 ENCOUNTER — Other Ambulatory Visit: Payer: Self-pay | Admitting: Infectious Diseases

## 2021-11-27 DIAGNOSIS — M6281 Muscle weakness (generalized): Secondary | ICD-10-CM | POA: Diagnosis not present

## 2021-11-27 DIAGNOSIS — Z471 Aftercare following joint replacement surgery: Secondary | ICD-10-CM | POA: Diagnosis not present

## 2021-11-27 DIAGNOSIS — M25512 Pain in left shoulder: Secondary | ICD-10-CM | POA: Diagnosis not present

## 2021-11-27 DIAGNOSIS — Z96612 Presence of left artificial shoulder joint: Secondary | ICD-10-CM | POA: Diagnosis not present

## 2021-11-27 DIAGNOSIS — M25612 Stiffness of left shoulder, not elsewhere classified: Secondary | ICD-10-CM | POA: Diagnosis not present

## 2021-12-02 DIAGNOSIS — Z79899 Other long term (current) drug therapy: Secondary | ICD-10-CM | POA: Diagnosis not present

## 2021-12-02 DIAGNOSIS — E559 Vitamin D deficiency, unspecified: Secondary | ICD-10-CM | POA: Diagnosis not present

## 2021-12-02 DIAGNOSIS — R030 Elevated blood-pressure reading, without diagnosis of hypertension: Secondary | ICD-10-CM | POA: Diagnosis not present

## 2021-12-02 DIAGNOSIS — M25512 Pain in left shoulder: Secondary | ICD-10-CM | POA: Diagnosis not present

## 2021-12-02 DIAGNOSIS — G894 Chronic pain syndrome: Secondary | ICD-10-CM | POA: Diagnosis not present

## 2021-12-02 DIAGNOSIS — E119 Type 2 diabetes mellitus without complications: Secondary | ICD-10-CM | POA: Diagnosis not present

## 2021-12-02 DIAGNOSIS — Z6833 Body mass index (BMI) 33.0-33.9, adult: Secondary | ICD-10-CM | POA: Diagnosis not present

## 2021-12-03 DIAGNOSIS — M25512 Pain in left shoulder: Secondary | ICD-10-CM | POA: Diagnosis not present

## 2021-12-17 DIAGNOSIS — M0589 Other rheumatoid arthritis with rheumatoid factor of multiple sites: Secondary | ICD-10-CM | POA: Diagnosis not present

## 2021-12-23 DIAGNOSIS — Z6835 Body mass index (BMI) 35.0-35.9, adult: Secondary | ICD-10-CM | POA: Diagnosis not present

## 2021-12-23 DIAGNOSIS — M0589 Other rheumatoid arthritis with rheumatoid factor of multiple sites: Secondary | ICD-10-CM | POA: Diagnosis not present

## 2021-12-23 DIAGNOSIS — Z79899 Other long term (current) drug therapy: Secondary | ICD-10-CM | POA: Diagnosis not present

## 2021-12-23 DIAGNOSIS — E669 Obesity, unspecified: Secondary | ICD-10-CM | POA: Diagnosis not present

## 2021-12-23 DIAGNOSIS — M1991 Primary osteoarthritis, unspecified site: Secondary | ICD-10-CM | POA: Diagnosis not present

## 2021-12-23 DIAGNOSIS — M25532 Pain in left wrist: Secondary | ICD-10-CM | POA: Diagnosis not present

## 2021-12-24 DIAGNOSIS — N200 Calculus of kidney: Secondary | ICD-10-CM | POA: Diagnosis not present

## 2022-01-01 DIAGNOSIS — Z79899 Other long term (current) drug therapy: Secondary | ICD-10-CM | POA: Diagnosis not present

## 2022-01-01 DIAGNOSIS — M25512 Pain in left shoulder: Secondary | ICD-10-CM | POA: Diagnosis not present

## 2022-01-01 DIAGNOSIS — R030 Elevated blood-pressure reading, without diagnosis of hypertension: Secondary | ICD-10-CM | POA: Diagnosis not present

## 2022-01-01 DIAGNOSIS — E119 Type 2 diabetes mellitus without complications: Secondary | ICD-10-CM | POA: Diagnosis not present

## 2022-01-01 DIAGNOSIS — G894 Chronic pain syndrome: Secondary | ICD-10-CM | POA: Diagnosis not present

## 2022-01-01 DIAGNOSIS — E559 Vitamin D deficiency, unspecified: Secondary | ICD-10-CM | POA: Diagnosis not present

## 2022-01-01 DIAGNOSIS — Z6833 Body mass index (BMI) 33.0-33.9, adult: Secondary | ICD-10-CM | POA: Diagnosis not present

## 2022-01-18 ENCOUNTER — Other Ambulatory Visit: Payer: Self-pay | Admitting: Orthopedic Surgery

## 2022-01-18 DIAGNOSIS — S46012A Strain of muscle(s) and tendon(s) of the rotator cuff of left shoulder, initial encounter: Secondary | ICD-10-CM

## 2022-01-27 DIAGNOSIS — R030 Elevated blood-pressure reading, without diagnosis of hypertension: Secondary | ICD-10-CM | POA: Diagnosis not present

## 2022-01-27 DIAGNOSIS — Z6833 Body mass index (BMI) 33.0-33.9, adult: Secondary | ICD-10-CM | POA: Diagnosis not present

## 2022-01-27 DIAGNOSIS — M25512 Pain in left shoulder: Secondary | ICD-10-CM | POA: Diagnosis not present

## 2022-01-27 DIAGNOSIS — Z79899 Other long term (current) drug therapy: Secondary | ICD-10-CM | POA: Diagnosis not present

## 2022-01-27 DIAGNOSIS — E119 Type 2 diabetes mellitus without complications: Secondary | ICD-10-CM | POA: Diagnosis not present

## 2022-01-27 DIAGNOSIS — E559 Vitamin D deficiency, unspecified: Secondary | ICD-10-CM | POA: Diagnosis not present

## 2022-01-27 DIAGNOSIS — G894 Chronic pain syndrome: Secondary | ICD-10-CM | POA: Diagnosis not present

## 2022-01-28 DIAGNOSIS — M0589 Other rheumatoid arthritis with rheumatoid factor of multiple sites: Secondary | ICD-10-CM | POA: Diagnosis not present

## 2022-01-28 DIAGNOSIS — Z79899 Other long term (current) drug therapy: Secondary | ICD-10-CM | POA: Diagnosis not present

## 2022-01-28 DIAGNOSIS — R5383 Other fatigue: Secondary | ICD-10-CM | POA: Diagnosis not present

## 2022-02-25 ENCOUNTER — Other Ambulatory Visit: Payer: Self-pay | Admitting: Orthopedic Surgery

## 2022-02-25 DIAGNOSIS — S46012A Strain of muscle(s) and tendon(s) of the rotator cuff of left shoulder, initial encounter: Secondary | ICD-10-CM

## 2022-03-03 DIAGNOSIS — E119 Type 2 diabetes mellitus without complications: Secondary | ICD-10-CM | POA: Diagnosis not present

## 2022-03-03 DIAGNOSIS — G894 Chronic pain syndrome: Secondary | ICD-10-CM | POA: Diagnosis not present

## 2022-03-03 DIAGNOSIS — R030 Elevated blood-pressure reading, without diagnosis of hypertension: Secondary | ICD-10-CM | POA: Diagnosis not present

## 2022-03-03 DIAGNOSIS — Z79899 Other long term (current) drug therapy: Secondary | ICD-10-CM | POA: Diagnosis not present

## 2022-03-03 DIAGNOSIS — Z6833 Body mass index (BMI) 33.0-33.9, adult: Secondary | ICD-10-CM | POA: Diagnosis not present

## 2022-03-03 DIAGNOSIS — E559 Vitamin D deficiency, unspecified: Secondary | ICD-10-CM | POA: Diagnosis not present

## 2022-03-03 DIAGNOSIS — E1165 Type 2 diabetes mellitus with hyperglycemia: Secondary | ICD-10-CM | POA: Diagnosis not present

## 2022-03-03 DIAGNOSIS — M25512 Pain in left shoulder: Secondary | ICD-10-CM | POA: Diagnosis not present

## 2022-03-04 DIAGNOSIS — H5213 Myopia, bilateral: Secondary | ICD-10-CM | POA: Diagnosis not present

## 2022-03-04 DIAGNOSIS — Z7984 Long term (current) use of oral hypoglycemic drugs: Secondary | ICD-10-CM | POA: Diagnosis not present

## 2022-03-04 DIAGNOSIS — H524 Presbyopia: Secondary | ICD-10-CM | POA: Diagnosis not present

## 2022-03-04 DIAGNOSIS — H52203 Unspecified astigmatism, bilateral: Secondary | ICD-10-CM | POA: Diagnosis not present

## 2022-03-04 DIAGNOSIS — H25013 Cortical age-related cataract, bilateral: Secondary | ICD-10-CM | POA: Diagnosis not present

## 2022-03-04 DIAGNOSIS — E119 Type 2 diabetes mellitus without complications: Secondary | ICD-10-CM | POA: Diagnosis not present

## 2022-03-04 DIAGNOSIS — H2513 Age-related nuclear cataract, bilateral: Secondary | ICD-10-CM | POA: Diagnosis not present

## 2022-03-05 DIAGNOSIS — M25512 Pain in left shoulder: Secondary | ICD-10-CM | POA: Diagnosis not present

## 2022-03-14 DIAGNOSIS — Z79899 Other long term (current) drug therapy: Secondary | ICD-10-CM | POA: Diagnosis not present

## 2022-03-14 DIAGNOSIS — M0589 Other rheumatoid arthritis with rheumatoid factor of multiple sites: Secondary | ICD-10-CM | POA: Diagnosis not present

## 2022-03-31 ENCOUNTER — Other Ambulatory Visit: Payer: Self-pay | Admitting: Orthopedic Surgery

## 2022-03-31 DIAGNOSIS — S46012A Strain of muscle(s) and tendon(s) of the rotator cuff of left shoulder, initial encounter: Secondary | ICD-10-CM

## 2022-04-01 DIAGNOSIS — E1165 Type 2 diabetes mellitus with hyperglycemia: Secondary | ICD-10-CM | POA: Diagnosis not present

## 2022-04-01 DIAGNOSIS — M25512 Pain in left shoulder: Secondary | ICD-10-CM | POA: Diagnosis not present

## 2022-04-01 DIAGNOSIS — R030 Elevated blood-pressure reading, without diagnosis of hypertension: Secondary | ICD-10-CM | POA: Diagnosis not present

## 2022-04-01 DIAGNOSIS — Z6833 Body mass index (BMI) 33.0-33.9, adult: Secondary | ICD-10-CM | POA: Diagnosis not present

## 2022-04-01 DIAGNOSIS — E119 Type 2 diabetes mellitus without complications: Secondary | ICD-10-CM | POA: Diagnosis not present

## 2022-04-01 DIAGNOSIS — G894 Chronic pain syndrome: Secondary | ICD-10-CM | POA: Diagnosis not present

## 2022-04-01 DIAGNOSIS — E559 Vitamin D deficiency, unspecified: Secondary | ICD-10-CM | POA: Diagnosis not present

## 2022-04-01 DIAGNOSIS — Z79899 Other long term (current) drug therapy: Secondary | ICD-10-CM | POA: Diagnosis not present

## 2022-04-29 ENCOUNTER — Other Ambulatory Visit: Payer: Self-pay | Admitting: Orthopedic Surgery

## 2022-04-29 DIAGNOSIS — S46012A Strain of muscle(s) and tendon(s) of the rotator cuff of left shoulder, initial encounter: Secondary | ICD-10-CM

## 2022-05-12 ENCOUNTER — Other Ambulatory Visit: Payer: Self-pay | Admitting: Cardiology

## 2022-05-30 ENCOUNTER — Other Ambulatory Visit: Payer: Self-pay | Admitting: Orthopedic Surgery

## 2022-05-30 DIAGNOSIS — S46012A Strain of muscle(s) and tendon(s) of the rotator cuff of left shoulder, initial encounter: Secondary | ICD-10-CM

## 2022-06-20 ENCOUNTER — Other Ambulatory Visit: Payer: Self-pay | Admitting: Infectious Diseases

## 2022-07-16 ENCOUNTER — Other Ambulatory Visit: Payer: Self-pay | Admitting: Cardiology

## 2022-08-03 ENCOUNTER — Other Ambulatory Visit: Payer: Self-pay | Admitting: Cardiology

## 2022-09-04 ENCOUNTER — Other Ambulatory Visit: Payer: Self-pay | Admitting: Cardiology

## 2022-10-16 ENCOUNTER — Encounter: Payer: Self-pay | Admitting: Radiology

## 2022-10-29 ENCOUNTER — Encounter: Payer: Self-pay | Admitting: *Deleted

## 2022-10-30 ENCOUNTER — Ambulatory Visit: Payer: Medicare HMO | Admitting: Neurology

## 2022-11-17 ENCOUNTER — Encounter: Payer: Self-pay | Admitting: *Deleted

## 2022-11-18 ENCOUNTER — Encounter: Payer: Self-pay | Admitting: Neurology

## 2022-11-18 ENCOUNTER — Ambulatory Visit: Payer: Medicare HMO | Admitting: Neurology

## 2022-11-18 ENCOUNTER — Telehealth: Payer: Self-pay | Admitting: Neurology

## 2022-11-18 VITALS — BP 149/94 | HR 77 | Ht 75.5 in | Wt 263.2 lb

## 2022-11-18 DIAGNOSIS — G4752 REM sleep behavior disorder: Secondary | ICD-10-CM | POA: Diagnosis not present

## 2022-11-18 DIAGNOSIS — Z8669 Personal history of other diseases of the nervous system and sense organs: Secondary | ICD-10-CM

## 2022-11-18 DIAGNOSIS — G20C Parkinsonism, unspecified: Secondary | ICD-10-CM
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
## 2022-11-18 NOTE — Telephone Encounter (Signed)
sent to [facility] nuclear medicine [phone]

## 2022-12-05 ENCOUNTER — Emergency Department (HOSPITAL_COMMUNITY): Payer: Medicare HMO

## 2022-12-05 ENCOUNTER — Emergency Department (HOSPITAL_COMMUNITY)
Admission: EM | Admit: 2022-12-05 | Discharge: 2022-12-05 | Disposition: A | Discharge status: Home / Self Care | Payer: Medicare HMO | Attending: Emergency Medicine | Admitting: Emergency Medicine

## 2022-12-05 ENCOUNTER — Other Ambulatory Visit: Payer: Self-pay

## 2022-12-05 DIAGNOSIS — E119 Type 2 diabetes mellitus without complications: Secondary | ICD-10-CM | POA: Diagnosis not present

## 2022-12-05 DIAGNOSIS — R109 Unspecified abdominal pain: Secondary | ICD-10-CM | POA: Insufficient documentation

## 2022-12-05 DIAGNOSIS — I10 Essential (primary) hypertension: Secondary | ICD-10-CM | POA: Diagnosis not present

## 2022-12-05 DIAGNOSIS — R110 Nausea: Secondary | ICD-10-CM | POA: Insufficient documentation

## 2022-12-05 DIAGNOSIS — Z7984 Long term (current) use of oral hypoglycemic drugs: Secondary | ICD-10-CM | POA: Diagnosis not present

## 2022-12-05 DIAGNOSIS — Z79899 Other long term (current) drug therapy: Secondary | ICD-10-CM | POA: Insufficient documentation

## 2022-12-05 DIAGNOSIS — B029 Zoster without complications: Secondary | ICD-10-CM | POA: Insufficient documentation

## 2022-12-05 LAB — COMPREHENSIVE METABOLIC PANEL
ALT: 17 U/L (ref 0–44)
AST: 16 U/L (ref 15–41)
Albumin: 4 g/dL (ref 3.5–5.0)
Alkaline Phosphatase: 76 U/L (ref 38–126)
Anion gap: 9 (ref 5–15)
BUN: 8 mg/dL (ref 8–23)
CO2: 22 mmol/L (ref 22–32)
Calcium: 9 mg/dL (ref 8.9–10.3)
Chloride: 106 mmol/L (ref 98–111)
Creatinine, Ser: 0.76 mg/dL (ref 0.61–1.24)
GFR, Estimated: >60 mL/min (ref >60)
Glucose, Bld: 113 mg/dL — ABNORMAL HIGH (ref 70–99)
Potassium: 3.6 mmol/L (ref 3.5–5.1)
Sodium: 137 mmol/L (ref 135–145)
Total Bilirubin: 0.8 mg/dL (ref 0.3–1.2)
Total Protein: 7.5 g/dL (ref 6.5–8.1)

## 2022-12-05 LAB — URINALYSIS, W/ REFLEX TO CULTURE (INFECTION SUSPECTED)
Bacteria, UA: NONE SEEN
Bilirubin Urine: NEGATIVE
Glucose, UA: NEGATIVE mg/dL
Hgb urine dipstick: NEGATIVE
Ketones, ur: NEGATIVE mg/dL
Leukocytes,Ua: NEGATIVE
Nitrite: NEGATIVE
Protein, ur: NEGATIVE mg/dL
Specific Gravity, Urine: 1.025 (ref 1.005–1.030)
pH: 5 (ref 5.0–8.0)

## 2022-12-05 LAB — CBC WITH DIFFERENTIAL/PLATELET
Abs Immature Granulocytes: 0.02 10*3/uL (ref 0.00–0.07)
Basophils Absolute: 0 10*3/uL (ref 0.0–0.1)
Basophils Relative: 0 %
Eosinophils Absolute: 0 10*3/uL (ref 0.0–0.5)
Eosinophils Relative: 1 %
HCT: 43.9 % (ref 39.0–52.0)
Hemoglobin: 14.5 g/dL (ref 13.0–17.0)
Immature Granulocytes: 0 %
Lymphocytes Relative: 23 %
Lymphs Abs: 1.9 10*3/uL (ref 0.7–4.0)
MCH: 30.4 pg (ref 26.0–34.0)
MCHC: 33 g/dL (ref 30.0–36.0)
MCV: 92 fL (ref 80.0–100.0)
Monocytes Absolute: 0.5 10*3/uL (ref 0.1–1.0)
Monocytes Relative: 5 %
Neutro Abs: 6 10*3/uL (ref 1.7–7.7)
Neutrophils Relative %: 71 %
Platelets: 275 10*3/uL (ref 150–400)
RBC: 4.77 MIL/uL (ref 4.22–5.81)
RDW: 14.8 % (ref 11.5–15.5)
WBC: 8.4 10*3/uL (ref 4.0–10.5)
nRBC: 0 % (ref 0.0–0.2)

## 2022-12-05 MED ORDER — OXYCODONE-ACETAMINOPHEN 5-325 MG PO TABS
2.0000 | ORAL_TABLET | Freq: Once | ORAL | Status: AC
  Administered 2022-12-05: 2 via ORAL
  Filled 2022-12-05: qty 2

## 2022-12-05 MED ORDER — HYDROXYZINE HCL 25 MG PO TABS: 25.0000 mg | ORAL_TABLET | Freq: Three times a day (TID) | ORAL | 0 refills | Status: AC | PRN

## 2022-12-05 MED ORDER — VALACYCLOVIR HCL 1 G PO TABS: 1000.0000 mg | ORAL_TABLET | Freq: Three times a day (TID) | ORAL | 0 refills | Status: DC

## 2022-12-05 MED ORDER — ONDANSETRON 4 MG PO TBDP
4.0000 mg | ORAL_TABLET | Freq: Once | ORAL | Status: AC
  Administered 2022-12-05: 4 mg via ORAL
  Filled 2022-12-05: qty 1

## 2022-12-05 MED ORDER — OXYCODONE HCL 5 MG PO TABS: 5.0000 mg | ORAL_TABLET | Freq: Four times a day (QID) | ORAL | 0 refills | Status: DC | PRN

## 2022-12-05 NOTE — ED Triage Notes (Signed)
Pt. Stated, Willie Howe had numerous kidney stones. This one has started and coming on more and more pain gradual.

## 2022-12-05 NOTE — ED Notes (Signed)
Pt reports urinating more than often and burning while urinating.

## 2022-12-05 NOTE — ED Provider Triage Note (Signed)
Emergency Medicine Provider Triage Evaluation Note  Willie Howe , a 62 y.o. male  was evaluated in triage.  Pt complains of left side flank pain and nausea.  Patient has history of kidney stones and had imaging done recently that showed non-obstructive stone, but he states the pain became worse over the last 4-5 days.  Denies fever, chills, dysuria, hematuria.   Review of Systems  Positive: As above Negative: As above  Physical Exam  BP (!) 137/94 (BP Location: Right Arm)   Pulse 84   Temp 98 F (36.7 C) (Oral)   Resp 14   SpO2 96%  Gen:   Awake, no distress   Resp:  Normal effort  MSK:   Moves extremities without difficulty  Other:    Medical Decision Making  Medically screening exam initiated at 10:40 AM.  Appropriate orders placed.  Willie Howe was informed that the remainder of the evaluation will be completed by another provider, this initial triage assessment does not replace that evaluation, and the importance of remaining in the ED until their evaluation is complete.     Melton Alar R, PA-C 12/05/22 1045

## 2022-12-05 NOTE — ED Provider Notes (Signed)
[place] EMERGENCY DEPARTMENT AT [facility] Provider Note   CSN: [id number] Arrival date & time: 12/05/22  4098     History  Chief Complaint  Patient presents with   Flank Pain   Nausea    Willie Howe is a 62 y.o. male.  With PMH of HTN, PE/DVT, RA, DM 2, BPPV, history of kidney stones who presents with flank pain and nausea concerned he has a kidney stone.  Patient complaining of worsening flank pain in the left lower back region over the past week.  Of note when the pain started his wife noted that there were blistering lesions and rash present.  She told her husband she thought he had shingles but he did not think so.  It is only stayed on the left side.  He does have a current lesion over his suprapubic region.  He is complaining of severe sharp electric-like pains.  He was convinced it was a kidney stone.  He has some mild discomfort with urination and peeing often but he is also on Flomax and history of BPPV.  No fevers, no chills, no vomiting.  Still walking no focal weakness.  No confusion.  Has been taking hydrocodone and gabapentin without relief.  He is on methotrexate for RA. No recent injuries.   Flank Pain       Home Medications Prior to Admission medications   Medication Sig Start Date End Date Taking? Authorizing Provider  hydrOXYzine (ATARAX) 25 MG tablet Take 1 tablet (25 mg total) by mouth every 8 (eight) hours as needed for up to 20 doses for itching. 12/05/22  Yes Mardene Sayer, MD  oxyCODONE (ROXICODONE) 5 MG immediate release tablet Take 1 tablet (5 mg total) by mouth every 6 (six) hours as needed for up to 10 doses for severe pain. 12/05/22  Yes Mardene Sayer, MD  valACYclovir (VALTREX) 1000 MG tablet Take 1 tablet (1,000 mg total) by mouth 3 (three) times daily. 12/05/22  Yes Mardene Sayer, MD  acetaminophen (TYLENOL) 650 MG CR tablet Take by mouth.    [provider]  amLODipine (NORVASC) 5 MG tablet TAKE 1 TABLET (5  MG TOTAL) BY MOUTH DAILY. 09/04/22   Jonelle Sidle, MD  aspirin 81 MG EC tablet Take 1 tablet by mouth daily.    [provider]  D3-50 1.25 MG (50000 UT) capsule Take 50,000 Units by mouth once a week. 10/10/21   [provider]  docusate sodium (COLACE) 100 MG capsule Take 200 mg by mouth every other day.    [provider]  folic acid (FOLVITE) 1 MG tablet Take 1 mg by mouth daily.    [provider]  gabapentin (NEURONTIN) 100 MG capsule Take 1 capsule (100 mg total) by mouth 3 (three) times daily. For pain 09/12/21   Vernetta Honey, PA-C  HYDROcodone-acetaminophen (NORCO) 7.5-325 MG tablet Take 1 tablet by mouth every 8 (eight) hours as needed for pain. 07/13/21   [provider]  inFLIXimab-abda (RENFLEXIS IV) Inject 1 Dose into the vein every 6 (six) weeks.    [provider]  loratadine (CLARITIN) 10 MG tablet Take 10 mg by mouth daily.    [provider]  metFORMIN (GLUCOPHAGE) 500 MG tablet Take 500 mg by mouth daily with breakfast.    [provider]  methotrexate 50 MG/2ML injection Inject 20 mg into the muscle every Friday. 01/05/19   [provider]  predniSONE (DELTASONE) 5 MG tablet  Take 5 mg by mouth daily.    [provider]  tamsulosin (FLOMAX) 0.4 MG CAPS capsule Take 0.4 mg by mouth at bedtime.     [provider]      Allergies    Lisinopril and Statins    Review of Systems   Review of Systems  Genitourinary:  Positive for flank pain.    Physical Exam Updated Vital Signs BP (!) 145/96   Pulse 71   Temp 98 F (36.7 C) (Oral)   Resp 12   SpO2 100%  Physical Exam Constitutional: Alert and oriented. Well appearing and in no distress. Eyes: Conjunctivae are normal. ENT      Head: Normocephalic and atraumatic. Cardiovascular: S1, S2,  Normal and symmetric distal pulses are present in all extremities.Warm and well perfused. Respiratory: Normal respiratory effort.  Breath sounds are normal. O2 sat 100 on RA Gastrointestinal: Soft and nontender. There is no CVA tenderness. Musculoskeletal: Normal range of motion in all extremities. Neurologic: Normal speech and language.  Equal strength bilateral lower extremities.  Sensation grossly intact.  No gross focal neurologic deficits are appreciated. Skin: Skin is warm, dry .  There is a crusting drying rash, tender to touch over the L1 dermatome from the left back over to the suprapubic region with 1 active small vesicular lesion over the suprapubic area.  No surrounding erythema or streaking.  No drainage present.  Very tender to touch, no fluctuance. Psychiatric: Mood and affect are normal. Speech and behavior are normal.  ED Results / Procedures / Treatments   Labs (all labs ordered are listed, but only abnormal results are displayed) Labs Reviewed  COMPREHENSIVE METABOLIC PANEL - Abnormal; Notable for the following components:      Result Value   Glucose, Bld 113 (*)    All other components within normal limits  CBC WITH DIFFERENTIAL/PLATELET  URINALYSIS, W/ REFLEX TO CULTURE (INFECTION SUSPECTED)    EKG None  Radiology CT Renal Stone Study  Result Date: 12/05/2022 CLINICAL DATA:  Flank pain.  Nephrolithiasis. EXAM: CT ABDOMEN AND PELVIS WITHOUT CONTRAST TECHNIQUE: Multidetector CT imaging of the abdomen and pelvis was performed following the standard protocol without IV contrast. RADIATION DOSE REDUCTION: This exam was performed according to the departmental dose-optimization program which includes automated exposure control, adjustment of the mA and/or kV according to patient size and/or use of iterative reconstruction technique. COMPARISON:  07/24/2015 FINDINGS: Lower chest: No acute findings. Hepatobiliary: No mass visualized on this unenhanced exam. Tiny calcified gallstone noted, however there is no evidence of cholecystitis or biliary ductal dilatation. Pancreas: No mass or inflammatory process  visualized on this unenhanced exam. Spleen:  Within normal limits in size. Adrenals/Urinary tract: Few tiny 1-2 mm renal calculi are seen bilaterally. No evidence of ureteral calculi or hydronephrosis. Unremarkable unopacified urinary bladder. Stomach/Bowel: No evidence of obstruction, inflammatory process, or abnormal fluid collections. Normal appendix visualized. Vascular/Lymphatic: No pathologically enlarged lymph nodes identified. No evidence of abdominal aortic aneurysm. Aortic atherosclerotic calcification incidentally noted. Reproductive:  Mildly enlarged prostate. Other:  None. Musculoskeletal: No suspicious bone lesions identified. Lumbar spine fusion hardware noted. IMPRESSION: Tiny bilateral renal calculi. No evidence of ureteral calculi, hydronephrosis, or other acute findings. Cholelithiasis. No radiographic evidence of cholecystitis. Mildly enlarged prostate. Electronically Signed   By: Danae Orleans M.D.   On: 12/05/2022 13:17    Procedures Procedures    Medications Ordered in ED Medications  oxyCODONE-acetaminophen (PERCOCET/ROXICET) 5-325 MG per tablet 2 tablet (has no administration in time range)  ondansetron (  ZOFRAN-ODT) disintegrating tablet 4 mg (4 mg Oral Given 12/05/22 1048)  oxyCODONE-acetaminophen (PERCOCET/ROXICET) 5-325 MG per tablet 2 tablet (2 tablets Oral Given 12/05/22 1048)    ED Course/ Medical Decision Making/ A&P                             Medical Decision Making  Willie Howe is a 62 y.o. male.  With PMH of HTN, PE/DVT, RA, DM 2, BPPV, history of kidney stones who presents with flank pain and nausea concerned he has a kidney stone.  Patient's exam is consistent with a shingles rash over the L1 dermatome.  There is 1 currently active lesion over the suprapubic region.  There is no fluctuance concerning for abscess.  No erythema warmth or streaking or fever concerning for superimposed cellulitis or sepsis.  This is consistent with his area of  tenderness.  Also considered kidney stones, UTI, MSK pain however no recent injuries.  CT renal stone study obtained which I personally reviewed bilateral renal stones but no actively passing stones.  He has incidental cholelithiasis without cholecystitis and asymptomatic in nature.  UA with no evidence of UTI, no RBCs present, no leukocyte esterase, no nitrite, no white WBCs.  Do think patient's pain is related to shingles infection which appears to be resolving.  Since there is one active lesion and patient is immunocompromise on methotrexate will give patient 7 days of Valtrex 1 g 3 times daily as patient's GFR greater than 60 as well as short course of oxycodone.  He will continue Tylenol, ibuprofen, gabapentin and follow-up with PCP.  Strict return precautions discussed.  Risk Prescription drug management.     Final Clinical Impression(s) / ED Diagnoses Final diagnoses:  Herpes zoster without complication  Flank pain    Rx / DC Orders ED Discharge Orders          Ordered    oxyCODONE (ROXICODONE) 5 MG immediate release tablet  Every 6 hours PRN        12/05/22 1632    valACYclovir (VALTREX) 1000 MG tablet  3 times daily        12/05/22 1632    hydrOXYzine (ATARAX) 25 MG tablet  Every 8 hours PRN        12/05/22 1641              Mardene Sayer, MD 12/05/22 1641

## 2022-12-05 NOTE — Discharge Instructions (Addendum)
You were seen for flank pain in the ER.  Your CT scan did show small kidney stones in both kidneys but no actively passing kidney stones that would cause your pain.  Your urine did not show any evidence of urinary tract infection.  Your CT scan also incidentally showed a gallstone but not causing any issues.   Based on your exam, I do think you have evidence of shingles that appears to be resolving.  I have sent you a prescription for Valtrex the antiviral medicine to take for shingles as well as oxycodone for severe pain.  You should also continue taking her gabapentin, Tylenol and ibuprofen for pain control.  Call your primary care doctor regarding your visit to the ER today.  Come back if any worsening spreading rash, uncontrollable pain, involvement of face or eyes, confusion, inability to walk or any other symptoms concerning to you.

## 2022-12-10 ENCOUNTER — Encounter (HOSPITAL_COMMUNITY)
Admission: RE | Admit: 2022-12-10 | Discharge: 2022-12-10 | Disposition: A | Discharge status: Home / Self Care | Payer: Medicare HMO | Source: Ambulatory Visit | Attending: Neurology | Admitting: Neurology

## 2022-12-10 DIAGNOSIS — Z8669 Personal history of other diseases of the nervous system and sense organs: Secondary | ICD-10-CM | POA: Insufficient documentation

## 2022-12-10 DIAGNOSIS — G20C Parkinsonism, unspecified: Secondary | ICD-10-CM | POA: Diagnosis not present

## 2022-12-10 DIAGNOSIS — G4752 REM sleep behavior disorder: Secondary | ICD-10-CM | POA: Insufficient documentation

## 2022-12-10 MED ORDER — POTASSIUM IODIDE (ANTIDOTE) 130 MG PO TABS
ORAL_TABLET | ORAL | Status: AC
  Filled 2022-12-10: qty 1

## 2022-12-10 MED ORDER — IOFLUPANE I 123 185 MBQ/2.5ML IV SOLN
4.6000 | Freq: Once | INTRAVENOUS | Status: AC | PRN
  Administered 2022-12-10: 4.6 via INTRAVENOUS
  Filled 2022-12-10: qty 5

## 2022-12-10 MED ORDER — POTASSIUM IODIDE (ANTIDOTE) 130 MG PO TABS
130.0000 mg | ORAL_TABLET | Freq: Once | ORAL | Status: AC
  Administered 2022-12-10: 130 mg via ORAL

## 2022-12-18 ENCOUNTER — Telehealth: Payer: Self-pay | Admitting: *Deleted

## 2022-12-18 NOTE — Telephone Encounter (Signed)
-----   Message from Huston Foley, MD sent at 12/11/2022 12:36 PM EDT ----- Please call patient (or designated party on DPR) regarding the recent nuclear medicine DaT scan result. As discussed, this is a specialized brain scan designed to aid with the diagnosis of tremor disorders, including parkinsonian disorders. A radioactive marker gets injected and the uptake is measured in the brain and compared to normal controls and right side of the brain is compared to the left side. A change in uptake can help with diagnosis of certain tremor disorders and narrow down the diagnostic possibilities. The patient's recent scan indicated abnormal (as in lower) uptake as compared to normal uptake pattern indicating an underlying parkinsonian disorder, as we suspected. Keep in mind, this is not a definitive test for Parkinson's disease and does not distinguish between Parkinson's disease and other, atypical parkinsonian disorders.  Please offer follow-up appointment to discuss treatment options.

## 2022-12-18 NOTE — Telephone Encounter (Signed)
I called pt and relayed the results of the DAT SCAN to him per Dr. Frances Furbish..  He verbalized understanding and an appt was made 12-23-2022 at 0815 to go over results and treatment options.

## 2022-12-23 ENCOUNTER — Encounter: Payer: Self-pay | Admitting: Neurology

## 2022-12-23 ENCOUNTER — Ambulatory Visit: Payer: Medicare HMO | Admitting: Neurology

## 2022-12-23 VITALS — BP 138/82 | HR 83 | Ht 75.0 in | Wt 262.2 lb

## 2022-12-23 DIAGNOSIS — Z9181 History of falling: Secondary | ICD-10-CM

## 2022-12-23 DIAGNOSIS — G4752 REM sleep behavior disorder: Secondary | ICD-10-CM

## 2022-12-23 DIAGNOSIS — G20C Parkinsonism, unspecified: Secondary | ICD-10-CM

## 2022-12-23 MED ORDER — ROPINIROLE HCL 0.25 MG PO TABS: 0.7500 mg | ORAL_TABLET | Freq: Three times a day (TID) | ORAL | 3 refills | Status: DC

## 2022-12-23 NOTE — Progress Notes (Signed)
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 and the patient and his daughter were in agreement. I spent 40 minutes in total face-to-face time and in reviewing records during pre-charting, more than 50% of which was spent in counseling and coordination of care, reviewing test results, reviewing medications and treatment regimen and/or in discussing or reviewing the diagnosis of PD, the prognosis and treatment options. Pertinent laboratory and imaging test results that were available during this visit with the patient were reviewed by me and considered in my medical decision making (see chart for details).

## 2022-12-23 NOTE — Patient Instructions (Signed)
It was nice to see you again and meet your daughter.  As discussed, we will start you on a medication for Parkinson's disease called Requip (generic name: ropinirole) 0.25 mg: Take one pill twice daily (morning and lunchtime) for one week, then one pill 3 times a day (morning, lunch and evening) for one week, then 2 pills 3 times a day for one week, then 3 pills three times a day thereafter. Common side effects reported are: Sedation, sleepiness, nausea, vomiting, and rare side effects are confusion, hallucinations, swelling in legs, and abnormal behaviors, including impulse control problems, which can manifest as excessive eating, obsessions with food or gambling, or hypersexuality.

## 2023-03-17 ENCOUNTER — Other Ambulatory Visit: Payer: Self-pay | Admitting: Neurology

## 2023-03-31 ENCOUNTER — Encounter: Payer: Self-pay | Admitting: Neurology

## 2023-03-31 ENCOUNTER — Ambulatory Visit: Payer: Medicare HMO | Admitting: Neurology

## 2023-03-31 VITALS — BP 138/77 | HR 80 | Ht 75.0 in | Wt 258.0 lb

## 2023-03-31 DIAGNOSIS — R1310 Dysphagia, unspecified: Secondary | ICD-10-CM

## 2023-03-31 DIAGNOSIS — G20C Parkinsonism, unspecified: Secondary | ICD-10-CM

## 2023-03-31 DIAGNOSIS — G4752 REM sleep behavior disorder: Secondary | ICD-10-CM | POA: Diagnosis not present

## 2023-03-31 NOTE — Progress Notes (Signed)
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 times daily which will be a slight total dose increase as well. He is advised to stay active mentally and physically.  We talked about the importance of stress reduction and staying positive.  We mutually agreed to monitor his swallowing issues, we will consider a MBSS at the next visit if need be.  He is advised to be very proactive about constipation issues and add MiraLAX daily if needed.  He is encouraged to continue with melatonin 5 mg strength as needed, he can get increase it to 10 mg or even 15 mg if needed.  He has a history of REM behavior disorder, which is often associated with parkinsonism. He is advised to follow-up routinely in this clinic in 6 months to see one of our nurse practitioners.  I answered all his questions today and he was in agreement.  I spent 30 minutes in total face-to-face time and in reviewing records during pre-charting, more than 50% of which was spent in counseling and coordination of care, reviewing test results, reviewing medications and treatment regimen and/or in discussing or reviewing the diagnosis of PD, the prognosis and treatment options. Pertinent laboratory and imaging test results that were available during  this visit with the patient were reviewed by me and considered in my medical decision making (see chart for details).

## 2023-03-31 NOTE — Patient Instructions (Signed)
It was nice to see you again today. As discussed, we will increase your ropinirole to 1 mg strength, take 1 pill 3 times daily which is a slight total dose increase.  2. Constipation can be a big problem in Parkinson's disease. It is important to be proactive: this includes ensuring adequate water intake and mobilization (walking around), utilizing stool softeners as needed, an over-the-counter laxative as needed up to daily if needed, adding a probiotic in pill form or in the form of yogurt can help as well. Sometimes, using a suppository or enema becomes necessary.  3.  Please hydrate well with water.  6 to 8 cups of water per day are recommended generally speaking, 8 ounce size each. 4.  We will consider a swallow study at the next visit, please be conscious and mindful about your swallowing, chew properly, take smaller bites and take sips of water in between bites.  Avoid drinking from a straw. 5.  Follow-up in this clinic to see the nurse practitioner in about 6 months.

## 2023-04-02 ENCOUNTER — Other Ambulatory Visit: Payer: Self-pay

## 2023-04-02 ENCOUNTER — Telehealth: Payer: Self-pay | Admitting: Neurology

## 2023-04-02 DIAGNOSIS — G20C Parkinsonism, unspecified: Secondary | ICD-10-CM

## 2023-04-02 MED ORDER — ROPINIROLE HCL 1 MG PO TABS: 1.0000 mg | ORAL_TABLET | Freq: Three times a day (TID) | ORAL | 3 refills | Status: DC

## 2023-04-02 NOTE — Telephone Encounter (Signed)
Pended to MD

## 2023-04-02 NOTE — Progress Notes (Signed)
Per last visit notes "As discussed, we will increase your ropinirole to 1 mg strength, take 1 pill 3 times daily which is a slight total dose increase " Order is pended

## 2023-04-02 NOTE — Telephone Encounter (Signed)
Pt called wanting to know when the dosage for his  rOPINIRole (REQUIP) 0.25 MG tablet will be changed and called in. Pt states that he was told in last visit that he will start taking 1 Pill TID. Please advise.

## 2023-07-22 ENCOUNTER — Other Ambulatory Visit: Payer: Self-pay | Admitting: Neurology

## 2023-09-04 ENCOUNTER — Other Ambulatory Visit: Payer: Self-pay | Admitting: Cardiology

## 2023-10-09 NOTE — Progress Notes (Signed)
 [facility] [address] [place]. [place] [address] [phone]       OFFICE FOLLOW UP NOTE  [name] Date of Birth:  1961-04-29 Medical Record Number:  [id number]    Primary neurologist: Dr. Frances Howe Reason for visit: Parkinson's disease    SUBJECTIVE:  CHIEF COMPLAINT:  Chief Complaint  Patient presents with   Follow-up    Pt in room 3. Daughter in room. Here Parkinson follow up. Pt reports doing well, pt has noticed tremors has worsened noticed about 2 months ago. Pt reports memory is okay, pt does puzzles and reading.    Follow-up visit:  Prior visit: 03/31/2023 with Dr. Frances Howe  Brief HPI:   Willie Howe is a 63 y.o. male who is followed for Parkinson's disease.  Initially seen by Dr. Laurann Howe 11/2022 with complaints of 1 year history of tremors primarily affecting her right hand and history of REM behavior disorder.  DaTscan 12/2022 showed asymmetric decreased activity in the right striatum and was started on ropinirole for symptomatic treatment.   At prior visit, ropinirole dosage increased to 1 mg 3 times daily for tremor.  Did complain of constipation and noted use of OTC treatments.  Noted swallowing difficulties over the past 4 to 5 months, agreed to monitor but consider MBSS at follow-up visit if needed.  Recommended continued use of melatonin as needed.   Interval history:  Returns today for 22-month follow-up visit accompanied by his daughter.  He reports worsening tremors over the past 2 months. Reports taking ropinirole but only taking 1 1mg  tablet around lunch time, he misunderstood instructions at prior visit to take 1 tablet 3 times a day.  Tremors can interfere with daily activity at times.  He does report increased stiffness upon awakening in the morning but gradually improves as he gets up and moving.  Ambulates without AD, no recent falls.  He tries to stay active with routine exercise as well as routine brain exercises.  Cognition  overall stable, daughter reports can fluctuate from day-to-day but overall stable. Swallowing overall stable, can cough with increased saliva, able to eat and drink without difficulty, able to swallow pills without difficulty.  He continues to use melatonin nightly which helps with sleep, typically sleeps from 10pm-7am.  Constipation has been well-controlled with MiraLAX.  Reports drinking about 8 small bottles of water per day. He plans on going on a cruise in August to the [place] for 8 days with his wife for their anniversary and birthdays.  No further questions or concerns at this time.       ROS:   14 system review of systems performed and negative with exception of those listed in HPI  PMH:  Past Medical History:  Diagnosis Date   Arthritis    BPPV (benign paroxysmal positional vertigo)    Bradycardia    Cataract    DVT (deep venous thrombosis) (HCC)    Elevated prostate specific antigen (PSA)    Graves disease 1996   Graves disease    hx of   History of kidney stones    History of pulmonary embolism    History of substance use disorder    Hypertension    Infection of prosthetic joint (HCC)    Kidney stones    Obesity    Rheumatoid arthritis (HCC)    Managed by Dr. Zenovia Howe (Rheumatology)   Sleep apnea    Intolerant of CPAP   Type 2 diabetes mellitus (HCC)  PSH:  Past Surgical History:  Procedure Laterality Date   BACK SURGERY  2015   COLONOSCOPY     EXAM UNDER ANESTHESIA WITH MANIPULATION OF KNEE Right 04/18/2015   Procedure: MANIPULATION OF RIGHT KNEE UNDER ANESTHESIA;  Surgeon: Willie Hearing, MD;  Location: [facility];  Service: Orthopedics;  Laterality: Right;   IVC Filter     removed December 2016   IVC FILTER REMOVAL  04/24/2015   KIDNEY STONE SURGERY  2010   KNEE ARTHROSCOPY Right 2006   meniscus    KNEE ARTHROSCOPY WITH MEDIAL MENISECTOMY Left 04/24/2016   Procedure: LEFT KNEE ARTHROSCOPY WITH PARTIAL MEDIAL MENISECTOMY;  Surgeon: Willie Hearing, MD;  Location: [facility];  Service: Orthopedics;  Laterality: Left;   KNEE JOINT MANIPULATION Right 04/18/2015   KNEE SURGERY     x 2   KNEE SURGERY Left 1976   ?ligament repair    LEFT HEART CATH AND CORONARY ANGIOGRAPHY N/A 11/29/2019   Procedure: LEFT HEART CATH AND CORONARY ANGIOGRAPHY;  Surgeon: [place]azil[place], Willie M, MD;  Location: [facility] INVASIVE CV LAB;  Service: Cardiovascular;  Laterality: N/A;   LITHOTRIPSY     LUMBAR WOUND DEBRIDEMENT N/A 02/23/2014   Procedure: LUMBAR WOUND DEBRIDEMENT;  Surgeon: Willie Meeker, MD;  Location: [facility];  Service: Neurosurgery;  Laterality: N/A;  exploration lumbar wound. repair of dural defect.   MENISECTOMY Right    open medial    NEPHROLITHOTOMY     PERIPHERAL VASCULAR CATHETERIZATION N/A 01/30/2015   Procedure: IVC Filter Insertion;  Surgeon: Willie Libman, MD;  Location: [facility] INVASIVE CV LAB;  Service: Cardiovascular;  Laterality: N/A;   PERIPHERAL VASCULAR CATHETERIZATION N/A 04/24/2015   Procedure: IVC Filter Removal;  Surgeon: Willie Libman, MD;  Location: [facility] INVASIVE CV LAB;  Service: Cardiovascular;  Laterality: N/A;   PERIPHERAL VASCULAR CATHETERIZATION N/A 08/14/2015   Procedure: IVC Filter Removal;  Surgeon: Willie Libman, MD;  Location: [facility] INVASIVE CV LAB;  Service: Cardiovascular;  Laterality: N/A;   REVISION TOTAL SHOULDER TO REVERSE TOTAL SHOULDER Left 09/11/2021   Procedure: REVISION TOTAL SHOULDER TO REVERSE TOTAL SHOULDER;  Surgeon: Willie Pippin, MD;  Location: [facility];  Service: Orthopedics;  Laterality: Left;   SHOULDER ARTHROSCOPY WITH OPEN ROTATOR CUFF REPAIR Left 11/10/2019   Procedure: SHOULDER ARTHROSCOPY WITH OPEN ROTATOR CUFF REPAIR;  Surgeon: Willie Hearing, MD;  Location: [facility];  Service: Orthopedics;  Laterality: Left;  pt tested + on 2/23, does not need Covid test < 90 days   SHOULDER ARTHROSCOPY WITH ROTATOR CUFF REPAIR AND OPEN BICEPS TENODESIS Left 02/15/2019   Procedure: SHOULDER ARTHROSCOPY WITH open  ROTATOR CUFF REPAIR AND OPEN BICEPS TENODESIS;  Surgeon: Willie Hearing, MD;  Location: [facility];  Service: Orthopedics;  Laterality: Left;   SPINE SURGERY  2015   Dr Gerlene Fee Fusion    SPINE SURGERY  1999   discectomy- lumbar   TOTAL KNEE ARTHROPLASTY Right 02/02/2015   Procedure:  RIGHT TOTAL KNEE ARTHROPLASTY;  Surgeon: Willie Hearing, MD;  Location: [facility];  Service: Orthopedics;  Laterality: Right;  LM with pt's daughter of new arrival time (10:45)     TOTAL KNEE ARTHROPLASTY Left 07/24/2016   Procedure: TOTAL KNEE ARTHROPLASTY;  Surgeon: Willie Hearing, MD;  Location: [facility];  Service: Orthopedics;  Laterality: Left;   TOTAL SHOULDER ARTHROPLASTY Left 11/08/2020   Procedure: TOTAL SHOULDER ARTHROPLASTY;  Surgeon: Oliver Barre, MD;  Location: [facility];  Service: Orthopedics;  Laterality: Left;  Left  shoulder Reverse Arthroplasty   ureteroscopic stone manipulation Left 08/08/2021    Social History:  Social History   Socioeconomic History   Marital status: Married    Spouse name: Not on file   Number of children: 4   Years of education: College   Highest education level: Not on file  Occupational History   Occupation: N/A  Tobacco Use   Smoking status: Former    Current packs/day: 0.00    Average packs/day: 1.5 packs/day for 20.0 years (30.0 ttl pk-yrs)    Types: Cigarettes    Start date: 02/03/1975    Quit date: 02/03/1995    Years since quitting: 28.7   Smokeless tobacco: Never  Vaping Use   Vaping status: Never Used  Substance and Sexual Activity   Alcohol use: No    Alcohol/week: 0.0 standard drinks of alcohol   Drug use: No    Comment: quit heroin and cocaine in 1994   Sexual activity: Yes  Other Topics Concern   Not on file  Social History Narrative   Drinks about 1 cup of coffee a day    Social Drivers of Corporate investment banker Strain: Not on file  Food Insecurity: Low Risk  (09/24/2023)   Received from [facility]   Hunger Vital Sign     Worried About Running Out of Food in the Last Year: Never true    Ran Out of Food in the Last Year: Never true  Transportation Needs: No Transportation Needs (09/24/2023)   Received from [facility]    In the past 12 months, has lack of reliable transportation kept you from medical appointments, meetings, work or from getting things needed for daily living? : No  Physical Activity: Not on file  Stress: Not on file  Social Connections: Not on file  Intimate Partner Violence: Not on file    Family History:  Family History  Problem Relation Age of Onset   Deep vein thrombosis Mother    Hypertension Mother    Breast cancer Mother    Hypertension Father    Prostate cancer Father    Colon cancer Father    Breast cancer Sister    CAD Sister    Stroke Maternal Aunt    Prostate cancer Paternal Uncle    Tremor Maternal Grandmother    Lung cancer Other    Glaucoma Neg Hx    Parkinson's disease Neg Hx     Medications:   Current Outpatient Medications on File Prior to Visit  Medication Sig Dispense Refill   acetaminophen (TYLENOL) 650 MG CR tablet Take by mouth.     amLODipine (NORVASC) 5 MG tablet TAKE 1 TABLET (5 MG TOTAL) BY MOUTH DAILY. 30 tablet 0   aspirin 81 MG EC tablet Take 1 tablet by mouth daily.     D3-50 1.25 MG (50000 UT) capsule Take 50,000 Units by mouth once a week.     docusate sodium (COLACE) 100 MG capsule Take 200 mg by mouth every other day.     folic acid (FOLVITE) 1 MG tablet Take 1 mg by mouth daily.     gabapentin (NEURONTIN) 100 MG capsule Take 1 capsule (100 mg total) by mouth 3 (three) times daily. For pain 90 capsule 0   HYDROcodone-acetaminophen (NORCO) 7.5-325 MG tablet Take 1 tablet by mouth every 8 (eight) hours as needed for pain.     hydrOXYzine (ATARAX) 25 MG tablet Take 1 tablet (25 mg total) by mouth every 8 (  eight) hours as needed for up to 20 doses for itching. 20 tablet 0   inFLIXimab-abda (RENFLEXIS IV) Inject 1 Dose into the  vein every 6 (six) weeks.     loratadine (CLARITIN) 10 MG tablet Take 10 mg by mouth daily.     metFORMIN (GLUCOPHAGE) 500 MG tablet Take 500 mg by mouth daily with breakfast.     methotrexate 50 MG/2ML injection Inject 20 mg into the muscle every Friday.     oxyCODONE (ROXICODONE) 5 MG immediate release tablet Take 1 tablet (5 mg total) by mouth every 6 (six) hours as needed for up to 10 doses for severe pain. 10 tablet 0   predniSONE (DELTASONE) 5 MG tablet Take 5 mg by mouth daily.     rOPINIRole (REQUIP) 1 MG tablet TAKE 1 TABLET BY MOUTH 3 TIMES DAILY. 270 tablet 1   tamsulosin (FLOMAX) 0.4 MG CAPS capsule Take 0.4 mg by mouth at bedtime.      valACYclovir (VALTREX) 1000 MG tablet Take 1 tablet (1,000 mg total) by mouth 3 (three) times daily. 21 tablet 0   No current facility-administered medications on file prior to visit.    Allergies:   Allergies  Allergen Reactions   Lisinopril Cough   Statins Other (See Comments)    Muscle pain      OBJECTIVE:  Physical Exam  Vitals:   10/12/23 1057  BP: 123/86  Pulse: 93  Weight: 262 lb 4.8 oz (119 kg)  Height: 6' 3.5" (1.918 Howe)   Body mass index is 32.35 kg/Howe. No results found.  General: well developed, well nourished, very pleasant middle-age African-American male, seated, in no evident distress  Neurologic Exam Mental Status: Awake and fully alert.  No evidence of aphasia or dysarthria.  No evidence of hypophonia.  Oriented to place and time. Recent and remote memory intact. Attention span, concentration and fund of knowledge appropriate. Mood and affect appropriate.  Mild facial masking. Cranial Nerves: Pupils equal, briskly reactive to light. Extraocular movements full without nystagmus. Visual fields full to confrontation. Howe intact. Facial sensation intact. Face, tongue, palate moves normally and symmetrically.  Motor: Normal strength in all tested extremity muscles.  Mildly slight increased tone in right upper  extremity.  Intermittent mild resting tremor of both upper extremities.  No evidence of postural or action tremor.  No tremor in lower extremities.  No tremor in head or jaw. Sensory.: intact to touch , pinprick , position and vibratory sensation.  Coordination: Rapid alternating movements mildly impaired on the right, better on the left. Finger-to-nose and heel-to-shin performed accurately bilaterally. Gait and Station: Arises from chair without difficulty. Stance is slightly hunched. Gait demonstrates slightly decreased stride length and decreased arm swing more so on the right and mild right pill-rolling tremor.  No use of AD. Reflexes: 1+ and symmetric. Toes downgoing.        ASSESSMENT/PLAN: Willie Howe is a 63 y.o. year old male    Parkinson's disease:  Reports worsening RUE tremor over the past 2 months Continue ropinirole but advised to ensure he is taking 1 mg 3 times daily (currently only taking around lunch time), recommend taking first thing in the morning, then around lunch time and again around dinner time. Recommend he gradually increase to TID dosing with taking morning and lunch time dose for 2-3 days then add on evening dose.  Continue MiraLAX as needed for constipation Continue melatonin for REM behavior disorder Swallowing overall stable, continue to monitor Continue routine memory  exercises for cognition Discussed importance of adequate hydration and routine physical exercise     Follow up in 6 months or call earlier if needed   CC:  PCP: Gwenlyn Found, MD    I spent 25 minutes of face-to-face and non-face-to-face time with patient and daughter.  This included previsit chart review, lab review, study review, order entry, electronic health record documentation, patient education and discussion regarding above diagnoses and treatment plan and answered all other questions to patient and daughters satisfaction  Ihor Austin, [facility]  [facility]  [facility] [address] [place], [place] [address]  Phone [phone] Fax [phone] Note: This document was prepared with digital dictation and possible smart phrase technology. Any transcriptional errors that result from this process are unintentional.

## 2023-10-12 ENCOUNTER — Ambulatory Visit: Payer: Medicare HMO | Admitting: Adult Health

## 2023-10-12 ENCOUNTER — Encounter: Payer: Self-pay | Admitting: Adult Health

## 2023-10-12 VITALS — BP 123/86 | HR 93 | Ht 75.5 in | Wt 262.3 lb

## 2023-10-12 DIAGNOSIS — G20A1 Parkinson's disease without dyskinesia, without mention of fluctuations: Secondary | ICD-10-CM

## 2023-10-12 MED ORDER — ROPINIROLE HCL 1 MG PO TABS: 1.0000 mg | ORAL_TABLET | Freq: Three times a day (TID) | ORAL | 3 refills | Status: DC

## 2023-10-12 NOTE — Patient Instructions (Addendum)
 Your Plan:  Continue ropinirole - ensure you are taking 1 pill three times per day   Continue melatonin nightly   Continue use of Miralax as needed for constipation   Continue to stay well hydrated and routine physical activity       Follow-up in 6 months or call earlier if needed      Thank you for coming to see [place] at [facility]. I hope we have been able to provide you high quality care today.  You may receive a patient satisfaction survey over the next few weeks. We would appreciate your feedback and comments so that we may continue to improve ourselves and the health of our patients.

## 2023-12-29 ENCOUNTER — Telehealth: Payer: Self-pay | Admitting: Adult Health

## 2023-12-29 NOTE — Telephone Encounter (Signed)
 Pt is asking for a call from RN to discuss a request to increase the strength of his  rOPINIRole  (REQUIP ) 1 MG tablet due to tremors in his left arm

## 2023-12-30 MED ORDER — ROPINIROLE HCL 1 MG PO TABS: 1.5000 mg | ORAL_TABLET | Freq: Three times a day (TID) | ORAL | 11 refills | Status: AC

## 2023-12-30 NOTE — Telephone Encounter (Signed)
 Can increase ropinirole  to 1.5 mg (1.5 tabs) 3 times daily. I will send in an updated rx.  Please monitor for side effects such as drowsiness, fatigue, dizziness, nausea and fluctuation of blood pressure.

## 2023-12-30 NOTE — Telephone Encounter (Signed)
 Contact the patient back, informed him of NP recommendations.  Patient verbally understood and was appreciative.

## 2023-12-30 NOTE — Addendum Note (Signed)
 Addended by: Johny Nap L on: 12/30/2023 02:31 PM   Modules accepted: Orders

## 2023-12-30 NOTE — Telephone Encounter (Signed)
 I contacted pt back, per last OV note pt was advised to ensure he is taking Requip  1 mg 3 times daily (currently only taking around lunch time), recommend taking first thing in the morning, then around lunch time and again around dinner time. Recommend he gradually increase to TID dosing with taking morning.  I clarified how he is currently taking the mediation and he stated he misspoke during the office visit.  He was not taking it only around lunch time. He was taking it as prescribed, TID. His wife assist with his medication and he was unaware she was giving it to him TID.  He has been taking it around 7 am, noon and 7 a night. He is having significant tremors in his L arm. Pt denies any other medical/medication changes. Do you recommend an increase in Requip ?

## 2024-03-30 NOTE — Progress Notes (Signed)
 [facility] [address] [place]. [place] [address] [phone]       OFFICE FOLLOW UP NOTE  [name] Date of Birth:  1961-07-26 Medical Record Number:  [id number]    Primary neurologist: Dr. Buck Reason for visit: Parkinson's disease    SUBJECTIVE:  CHIEF COMPLAINT:  Chief Complaint  Patient presents with   Follow-up    Pt in room 8. Here for Parkinson follow up. Patient reports doing well, left hand tremor is stable. No falls.  No concerns.     Follow-up visit:  Prior visit: 10/12/2023  Brief HPI:   Willie Howe is a 63 y.o. male who is followed for Parkinson's disease.  Initially seen by Dr. Buck 11/2022 with complaints of 1 year history of tremors primarily affecting her right hand and history of REM behavior disorder.  DaTscan  12/2022 showed asymmetric decreased activity in the right striatum and was started on ropinirole  for symptomatic treatment.   At prior visit, complained of worsening tremor and after further discussion, he was only taking ropinirole  1mg  daily vs TID as prescribed.  He is advised to gradually increase to TID dosing.   In May, patient called office requesting dosage adjustment.  He spoke with CMA and clarified that he was actually taking ropinirole  3 times daily, not daily as previously reported.  He complained of continued worsening tremor.  Dosage increased to 1.5 mg TID.     Interval history:  Patient returns for 55-month follow-up.  Reports overall doing well since prior visit.  Tremors have improved on increased dose of ropinirole  currently taking 1.5 mg 3 times daily without side effects. Still has some tremor but not overly bothersome or interfere with activity. Gait stable, no recent falls, ambulates without AD. Reports cognition fairly stable, more difficulty with short-term memory.  He continues to drive without difficulty and denies getting lost.  Continues to maintain ADLs independently.  Wife does assist  with medications.  Continues on melatonin at night to help with sleep.  Continues with MiraLAX  as needed for constipation.  He tries to stay active with routine physical activity as well as routine brain exercises.  He has been experiencing left shoulder pain with limited range of motion and clicking, he is scheduled to see orthopedics this afternoon.  He is leaving for an 8-day cruise tomorrow to the [place] for their anniversary and birthdays.  No further questions or concerns at this time.     ROS:   14 system review of systems performed and negative with exception of those listed in HPI  PMH:  Past Medical History:  Diagnosis Date   Arthritis    BPPV (benign paroxysmal positional vertigo)    Bradycardia    Cataract    DVT (deep venous thrombosis) (HCC)    Elevated prostate specific antigen (PSA)    Graves disease 1996   Graves disease    hx of   History of kidney stones    History of pulmonary embolism    History of substance use disorder    Hypertension    Infection of prosthetic joint (HCC)    Kidney stones    Obesity    Prostate cancer [facility])    June 2025   Rheumatoid arthritis (HCC)    Managed by Dr. Jon Jacob (Rheumatology)   Sleep apnea    Intolerant of CPAP   Type 2 diabetes mellitus (HCC)     PSH:  Past Surgical History:  Procedure Laterality Date  BACK SURGERY  2015   COLONOSCOPY     EXAM UNDER ANESTHESIA WITH MANIPULATION OF KNEE Right 04/18/2015   Procedure: MANIPULATION OF RIGHT KNEE UNDER ANESTHESIA;  Surgeon: Taft FORBES Minerva, MD;  Location: [facility];  Service: Orthopedics;  Laterality: Right;   IVC Filter     removed December 2016   IVC FILTER REMOVAL  04/24/2015   KIDNEY STONE SURGERY  2010   KNEE ARTHROSCOPY Right 2006   meniscus    KNEE ARTHROSCOPY WITH MEDIAL MENISECTOMY Left 04/24/2016   Procedure: LEFT KNEE ARTHROSCOPY WITH PARTIAL MEDIAL MENISECTOMY;  Surgeon: Taft FORBES Minerva, MD;  Location: [facility];  Service: Orthopedics;  Laterality:  Left;   KNEE JOINT MANIPULATION Right 04/18/2015   KNEE SURGERY     x 2   KNEE SURGERY Left 1976   ?ligament repair    LEFT HEART CATH AND CORONARY ANGIOGRAPHY N/A 11/29/2019   Procedure: LEFT HEART CATH AND CORONARY ANGIOGRAPHY;  Surgeon: Swaziland, Peter M, MD;  Location: [facility] INVASIVE CV LAB;  Service: Cardiovascular;  Laterality: N/A;   LITHOTRIPSY     LUMBAR WOUND DEBRIDEMENT N/A 02/23/2014   Procedure: LUMBAR WOUND DEBRIDEMENT;  Surgeon: Darina MALVA Boehringer, MD;  Location: [facility] OR;  Service: Neurosurgery;  Laterality: N/A;  exploration lumbar wound. repair of dural defect.   MENISECTOMY Right    open medial    NEPHROLITHOTOMY     PERIPHERAL VASCULAR CATHETERIZATION N/A 01/30/2015   Procedure: IVC Filter Insertion;  Surgeon: Gaile LELON New, MD;  Location: [facility] INVASIVE CV LAB;  Service: Cardiovascular;  Laterality: N/A;   PERIPHERAL VASCULAR CATHETERIZATION N/A 04/24/2015   Procedure: IVC Filter Removal;  Surgeon: Gaile LELON New, MD;  Location: [facility] INVASIVE CV LAB;  Service: Cardiovascular;  Laterality: N/A;   PERIPHERAL VASCULAR CATHETERIZATION N/A 08/14/2015   Procedure: IVC Filter Removal;  Surgeon: Gaile LELON New, MD;  Location: [facility] INVASIVE CV LAB;  Service: Cardiovascular;  Laterality: N/A;   REVISION TOTAL SHOULDER TO REVERSE TOTAL SHOULDER Left 09/11/2021   Procedure: REVISION TOTAL SHOULDER TO REVERSE TOTAL SHOULDER;  Surgeon: Cristy Bonner DASEN, MD;  Location: [facility];  Service: Orthopedics;  Laterality: Left;   SHOULDER ARTHROSCOPY WITH OPEN ROTATOR CUFF REPAIR Left 11/10/2019   Procedure: SHOULDER ARTHROSCOPY WITH OPEN ROTATOR CUFF REPAIR;  Surgeon: Minerva Taft FORBES, MD;  Location: [facility];  Service: Orthopedics;  Laterality: Left;  pt tested + on 2/23, does not need Covid test < 90 days   SHOULDER ARTHROSCOPY WITH ROTATOR CUFF REPAIR AND OPEN BICEPS TENODESIS Left 02/15/2019   Procedure: SHOULDER ARTHROSCOPY WITH open ROTATOR CUFF REPAIR AND OPEN BICEPS TENODESIS;  Surgeon: Minerva Taft FORBES, MD;  Location: [facility];  Service: Orthopedics;  Laterality: Left;   SPINE SURGERY  2015   Dr Boehringer Fusion    SPINE SURGERY  1999   discectomy- lumbar   TOTAL KNEE ARTHROPLASTY Right 02/02/2015   Procedure:  RIGHT TOTAL KNEE ARTHROPLASTY;  Surgeon: Taft FORBES Minerva, MD;  Location: [facility];  Service: Orthopedics;  Laterality: Right;  LM with pt's daughter of new arrival time (10:45)     TOTAL KNEE ARTHROPLASTY Left 07/24/2016   Procedure: TOTAL KNEE ARTHROPLASTY;  Surgeon: Taft FORBES Minerva, MD;  Location: [facility];  Service: Orthopedics;  Laterality: Left;   TOTAL SHOULDER ARTHROPLASTY Left 11/08/2020   Procedure: TOTAL SHOULDER ARTHROPLASTY;  Surgeon: Onesimo Oneil LABOR, MD;  Location: [facility];  Service: Orthopedics;  Laterality: Left;  Left shoulder Reverse Arthroplasty   ureteroscopic stone manipulation Left 08/08/2021  Social History:  Social History   Socioeconomic History   Marital status: Married    Spouse name: Not on file   Number of children: 4   Years of education: College   Highest education level: Not on file  Occupational History   Occupation: N/A  Tobacco Use   Smoking status: Former    Current packs/day: 0.00    Average packs/day: 1.5 packs/day for 20.0 years (30.0 ttl pk-yrs)    Types: Cigarettes    Start date: 02/03/1975    Quit date: 02/03/1995    Years since quitting: 29.1   Smokeless tobacco: Never  Vaping Use   Vaping status: Never Used  Substance and Sexual Activity   Alcohol  use: No    Alcohol /week: 0.0 standard drinks of alcohol    Drug use: No    Comment: quit heroin and cocaine in 1994   Sexual activity: Yes  Other Topics Concern   Not on file  Social History Narrative   Drinks about 1 cup of coffee a day    Social Drivers of Corporate investment banker Strain: Not on file  Food Insecurity: Low Risk  (03/25/2024)   Received from [facility]   Hunger Vital Sign    Within the past 12 months, you worried that your food would run out  before you got money to buy more: Never true    Within the past 12 months, the food you bought just didn't last and you didn't have money to get more. : Never true  Transportation Needs: No Transportation Needs (03/25/2024)   Received from [facility]    In the past 12 months, has lack of reliable transportation kept you from medical appointments, meetings, work or from getting things needed for daily living? : No  Physical Activity: Not on file  Stress: Not on file  Social Connections: Not on file  Intimate Partner Violence: Not on file    Family History:  Family History  Problem Relation Age of Onset   Deep vein thrombosis Mother    Hypertension Mother    Breast cancer Mother    Hypertension Father    Prostate cancer Father    Colon cancer Father    Breast cancer Sister    CAD Sister    Stroke Maternal Aunt    Prostate cancer Paternal Uncle    Tremor Maternal Grandmother    Lung cancer Other    Glaucoma Neg Hx    Parkinson's disease Neg Hx     Medications:   Current Outpatient Medications on File Prior to Visit  Medication Sig Dispense Refill   acetaminophen  (TYLENOL ) 650 MG CR tablet Take by mouth.     amLODipine  (NORVASC ) 5 MG tablet TAKE 1 TABLET (5 MG TOTAL) BY MOUTH DAILY. 30 tablet 0   aspirin  81 MG EC tablet Take 1 tablet by mouth daily.     D3-50 1.25 MG (50000 UT) capsule Take 50,000 Units by mouth once a week.     docusate sodium  (COLACE) 100 MG capsule Take 200 mg by mouth every other day.     folic acid  (FOLVITE ) 1 MG tablet Take 1 mg by mouth daily.     gabapentin  (NEURONTIN ) 100 MG capsule Take 1 capsule (100 mg total) by mouth 3 (three) times daily. For pain 90 capsule 0   HYDROcodone -acetaminophen  (NORCO) 7.5-325 MG tablet Take 1 tablet by mouth every 8 (eight) hours as needed for pain.     hydrOXYzine  (ATARAX ) 25 MG tablet  Take 1 tablet (25 mg total) by mouth every 8 (eight) hours as needed for up to 20 doses for itching. 20 tablet 0    inFLIXimab -abda (RENFLEXIS  IV) Inject 1 Dose into the vein every 6 (six) weeks.     loratadine  (CLARITIN ) 10 MG tablet Take 10 mg by mouth daily.     metFORMIN  (GLUCOPHAGE ) 500 MG tablet Take 500 mg by mouth daily with breakfast.     methotrexate  50 MG/2ML injection Inject 20 mg into the muscle every Friday.     oxyCODONE  (ROXICODONE ) 5 MG immediate release tablet Take 1 tablet (5 mg total) by mouth every 6 (six) hours as needed for up to 10 doses for severe pain. 10 tablet 0   predniSONE  (DELTASONE ) 5 MG tablet Take 5 mg by mouth daily.     rOPINIRole  (REQUIP ) 1 MG tablet Take 1.5 tablets (1.5 mg total) by mouth 3 (three) times daily. 135 tablet 11   tamsulosin  (FLOMAX ) 0.4 MG CAPS capsule Take 0.4 mg by mouth at bedtime.      valACYclovir  (VALTREX ) 1000 MG tablet Take 1 tablet (1,000 mg total) by mouth 3 (three) times daily. 21 tablet 0   No current facility-administered medications on file prior to visit.    Allergies:   Allergies  Allergen Reactions   Lisinopril Cough   Statins Other (See Comments)    Muscle pain      OBJECTIVE:  Physical Exam  Vitals:   03/31/24 0754  BP: 127/75  Pulse: 84  Weight: 260 lb 9.6 oz (118.2 kg)  Height: 6' 3.5 (1.918 m)   Body mass index is 32.14 kg/m. No results found.  General: well developed, well nourished, very pleasant middle-age African-American male, seated, in no evident distress  Neurologic Exam Mental Status: Awake and fully alert.  No evidence of aphasia or dysarthria.  No evidence of hypophonia.  Oriented to place and time. Recent and remote memory intact. Attention span, concentration and fund of knowledge appropriate. Mood and affect appropriate.  Mild facial masking. Cranial Nerves: Pupils equal, briskly reactive to light. Extraocular movements full without nystagmus. Visual fields full to confrontation. Hearing intact. Facial sensation intact. Face, tongue, palate moves normally and symmetrically.  Motor: Normal strength  in all tested extremity muscles except limited testing of LUE d/t shoulder pain.  Mildly slight increased tone in right upper extremity.  No evidence of resting tremor today.  No evidence of postural or action tremor.  No tremor in lower extremities.  No tremor in head or jaw. Coordination: Rapid alternating movements mildly impaired on the right, better on the left. Finger-to-nose and heel-to-shin performed accurately bilaterally. Gait and Station: Arises from chair without difficulty. Stance is slightly hunched. Gait demonstrates slightly decreased stride length and decreased arm swing more so on the right and mild right pill-rolling tremor.  No use of AD. Reflexes: 1+ and symmetric. Toes downgoing.        ASSESSMENT/PLAN: Willie Howe is a 63 y.o. year old male    Parkinson's disease:  Continue ropinirole  1.5mg  TID - rx up to date. Noted improvement of tremors Continue MiraLAX  as needed for constipation Continue melatonin for REM behavior disorder Swallowing overall stable, continue to monitor Continue routine memory exercises for cognition Discussed importance of adequate hydration and routine physical exercise     Follow up in 6-8 months or call earlier if needed   CC:  PCP: Leila Lucie LABOR, MD    I personally spent a total of 25 minutes in  the care of the patient today including preparing to see the patient, performing a medically appropriate exam/evaluation, counseling and educating, and documenting clinical information in the EHR.   Harlene Bogaert, AGNP-BC  [facility] [address] [place], [place] [address]  Phone [phone] Fax [phone] Note: This document was prepared with digital dictation and possible smart phrase technology. Any transcriptional errors that result from this process are unintentional.

## 2024-03-31 ENCOUNTER — Ambulatory Visit: Payer: Medicare HMO | Admitting: Adult Health

## 2024-03-31 ENCOUNTER — Encounter: Payer: Self-pay | Admitting: Adult Health

## 2024-03-31 VITALS — BP 127/75 | HR 84 | Ht 75.5 in | Wt 260.6 lb

## 2024-03-31 DIAGNOSIS — G20A1 Parkinson's disease without dyskinesia, without mention of fluctuations: Secondary | ICD-10-CM

## 2024-03-31 NOTE — Patient Instructions (Addendum)
 Your Plan:  Continue Requip  1.5mg  (1.5 tabs) three times daily   Continue use of MiraLAX  as needed for constipation  Continue melatonin nightly to help with sleep  Ensure routine physical and cognitive exercises as well as ensuring good sleep, healthy diet, and adequate water  intake     Follow up in 6-8 months or call earlier if needed     Thank you for coming to see us  at [facility]. I hope we have been able to provide you high quality care today.  You may receive a patient satisfaction survey over the next few weeks. We would appreciate your feedback and comments so that we may continue to improve ourselves and the health of our patients.

## 2024-06-16 ENCOUNTER — Encounter: Payer: Self-pay | Admitting: Orthopaedic Surgery

## 2024-06-16 ENCOUNTER — Other Ambulatory Visit: Payer: Self-pay | Admitting: Orthopaedic Surgery

## 2024-06-16 DIAGNOSIS — Z96612 Presence of left artificial shoulder joint: Secondary | ICD-10-CM

## 2024-06-17 ENCOUNTER — Ambulatory Visit
Admission: RE | Admit: 2024-06-17 | Discharge: 2024-06-17 | Disposition: A | Discharge status: Home / Self Care | Source: Ambulatory Visit | Attending: Orthopaedic Surgery | Admitting: Orthopaedic Surgery

## 2024-06-17 DIAGNOSIS — Z96612 Presence of left artificial shoulder joint: Secondary | ICD-10-CM

## 2024-07-04 ENCOUNTER — Telehealth: Payer: Self-pay | Admitting: *Deleted

## 2024-07-04 NOTE — Telephone Encounter (Signed)
 Received clearance request from [facility] for Revision Left Total Shoulder Arthroplasty under general anesthesia. Pt last saw Harlene NP in February and August 2025. Clearance placed in Jessica's office for review/signature.

## 2024-07-05 NOTE — Patient Instructions (Addendum)
 SURGICAL WAITING ROOM VISITATION Patients having surgery or a procedure may have no more than 2 support people in the waiting area - these visitors may rotate.    Children under the age of 45 must have an adult with them who is not the patient.  If the patient needs to stay at the hospital during part of their recovery, the visitor guidelines for inpatient rooms apply. Pre-op nurse will coordinate an appropriate time for 1 support person to accompany patient in pre-op.  This support person may not rotate.    Please refer to the [facility] website for the visitor guidelines for Inpatients (after your surgery is over and you are in a regular room).       Your procedure is scheduled on: 07-13-24   Report to [facility] Main Entrance    Report to admitting at 11:45 AM   Call this number if you have problems the morning of surgery [phone]   Do not eat food or drink liquids :After Midnight.          If you have questions, please contact your surgeon's office.   FOLLOW  ANY ADDITIONAL PRE OP INSTRUCTIONS YOU RECEIVED FROM YOUR SURGEON'S OFFICE!!!     Oral Hygiene is also important to reduce your risk of infection.                                    Remember - BRUSH YOUR TEETH THE MORNING OF SURGERY WITH YOUR REGULAR TOOTHPASTE   Do NOT smoke after Midnight   Take these medicines the morning of surgery with A SIP OF WATER :    Amlodipine    Claritin    Prednisone    Ropinirole    If needed Tylenol , Hydrocodone   Stop all vitamins and herbal supplements 7 days before surgery  How to Manage Your Diabetes Before and After Surgery  Why is it important to control my blood sugar before and after surgery? Improving blood sugar levels before and after surgery helps healing and can limit problems. A way of improving blood sugar control is eating a healthy diet by:  Eating less sugar and carbohydrates  Increasing activity/exercise  Talking with your doctor about reaching your  blood sugar goals High blood sugars (greater than 180 mg/dL) can raise your risk of infections and slow your recovery, so you will need to focus on controlling your diabetes during the weeks before surgery. Make sure that the doctor who takes care of your diabetes knows about your planned surgery including the date and location.  How do I manage my blood sugar before surgery? Check your blood sugar at least 4 times a day, starting 2 days before surgery, to make sure that the level is not too high or low. Check your blood sugar the morning of your surgery when you wake up and every 2 hours until you get to the Short Stay unit. If your blood sugar is less than 70 mg/dL, you will need to treat for low blood sugar: Do not take insulin . Treat a low blood sugar (less than 70 mg/dL) with  cup of clear juice (cranberry or apple), 4 glucose tablets, OR glucose gel. Recheck blood sugar in 15 minutes after treatment (to make sure it is greater than 70 mg/dL). If your blood sugar is not greater than 70 mg/dL on recheck, call [phone] for further instructions. Report your blood sugar to the short stay nurse when you get  to Short Stay.  If you are admitted to the hospital after surgery: Your blood sugar will be checked by the staff and you will probably be given insulin  after surgery (instead of oral diabetes medicines) to make sure you have good blood sugar levels. The goal for blood sugar control after surgery is 80-180 mg/dL.   WHAT DO I DO ABOUT MY DIABETES MEDICATION?  Do not take oral diabetes medicines (pills) the morning of surgery (do not take Metformin  the morning of surgery)   DO NOT TAKE THE FOLLOWING 7 DAYS PRIOR TO SURGERY: Ozempic, Wegovy, Rybelsus (Semaglutide), Byetta (exenatide), Bydureon (exenatide ER), Victoza, Saxenda (liraglutide), or Trulicity (dulaglutide) Mounjaro (Tirzepatide) Adlyxin (Lixisenatide), Polyethylene Glycol Loxenatide.  Reviewed and Endorsed by [facility]  Patient Education Committee, August 2015  Bring CPAP mask and tubing day of surgery.                              You may not have any metal on your body including , jewelry, and body piercing             Do not wear  lotions, powders, cologne, or deodorant              Men may shave face and neck.   Do not bring valuables to the hospital. [facility] IS NOT RESPONSIBLE   FOR VALUABLES.   Contacts, dentures or bridgework may not be worn into surgery.   Bring small overnight bag day of surgery.   DO NOT BRING YOUR HOME MEDICATIONS TO THE HOSPITAL. PHARMACY WILL DISPENSE MEDICATIONS LISTED ON YOUR MEDICATION LIST TO YOU DURING YOUR ADMISSION IN THE HOSPITAL!    Special Instructions: Bring a copy of your healthcare power of attorney and living will documents the day of surgery if you haven't scanned them before.              Please read over the following fact sheets you were given: IF YOU HAVE QUESTIONS ABOUT YOUR PRE-OP INSTRUCTIONS PLEASE CALL [phone] Gwen  If you received a COVID test during your pre-op visit  it is requested that you wear a mask when out in public, stay away from anyone that may not be feeling well and notify your surgeon if you develop symptoms. If you test positive for Covid or have been in contact with anyone that has tested positive in the last 10 days please notify you surgeon.   Pre-operative 4 CHG Bath Instructions  DYNA-Hex 4 Chlorhexidine  Gluconate 4% Solution Antiseptic 4 fl. oz   You can play a key role in reducing the risk of infection after surgery. Your skin needs to be as free of germs as possible. You can reduce the number of germs on your skin by washing with CHG (chlorhexidine  gluconate) soap before surgery. CHG is an antiseptic soap that kills germs and continues to kill germs even after washing.   DO NOT use if you have an allergy to chlorhexidine /CHG or antibacterial soaps. If your skin becomes reddened or irritated, stop using the CHG and  notify one of our RNs at   Please shower with the CHG soap starting 4 days before surgery using the following schedule:     Please keep in mind the following:  DO NOT shave, including legs and underarms, starting the day of your first shower.   You may shave your face at any point before/day of surgery.  Place clean sheets on your bed  the day you start using CHG soap. Use a clean washcloth (not used since being washed) for each shower. DO NOT sleep with pets once you start using the CHG.  CHG Shower Instructions:  If you choose to wash your hair and private area, wash first with your normal shampoo/soap.  After you use shampoo/soap, rinse your hair and body thoroughly to remove shampoo/soap residue.  Turn the water  OFF and apply about 3 tablespoons (45 ml) of CHG soap to a CLEAN washcloth.  Apply CHG soap ONLY FROM YOUR NECK DOWN TO YOUR TOES (washing for 3-5 minutes)  DO NOT use CHG soap on face, private areas, open wounds, or sores.  Pay special attention to the area where your surgery is being performed.  If you are having back surgery, having someone wash your back for you may be helpful. Wait 2 minutes after CHG soap is applied, then you may rinse off the CHG soap.  Pat dry with a clean towel  Put on clean clothes/pajamas   If you choose to wear lotion, please use ONLY the CHG-compatible lotions on the back of this paper.     Additional instructions for the day of surgery: DO NOT APPLY any lotions, deodorants, cologne, or perfumes.   Put on clean/comfortable clothes.  Brush your teeth.  Ask your nurse before applying any prescription medications to the skin.   CHG Compatible Lotions   Aveeno Moisturizing lotion  Cetaphil Moisturizing Cream  Cetaphil Moisturizing Lotion  Clairol Herbal Essence Moisturizing Lotion, Dry Skin  Clairol Herbal Essence Moisturizing Lotion, Extra Dry Skin  Clairol Herbal Essence Moisturizing Lotion, Normal Skin  Curel Age Defying Therapeutic  Moisturizing Lotion with Alpha Hydroxy  Curel Extreme Care Body Lotion  Curel Soothing Hands Moisturizing Hand Lotion  Curel Therapeutic Moisturizing Cream, Fragrance-Free  Curel Therapeutic Moisturizing Lotion, Fragrance-Free  Curel Therapeutic Moisturizing Lotion, Original Formula  Eucerin Daily Replenishing Lotion  Eucerin Dry Skin Therapy Plus Alpha Hydroxy Crme  Eucerin Dry Skin Therapy Plus Alpha Hydroxy Lotion  Eucerin Original Crme  Eucerin Original Lotion  Eucerin Plus Crme Eucerin Plus Lotion  Eucerin TriLipid Replenishing Lotion  Keri Anti-Bacterial Hand Lotion  Keri Deep Conditioning Original Lotion Dry Skin Formula Softly Scented  Keri Deep Conditioning Original Lotion, Fragrance Free Sensitive Skin Formula  Keri Lotion Fast Absorbing Fragrance Free Sensitive Skin Formula  Keri Lotion Fast Absorbing Softly Scented Dry Skin Formula  Keri Original Lotion  Keri Skin Renewal Lotion Keri Silky Smooth Lotion  Keri Silky Smooth Sensitive Skin Lotion  Nivea Body Creamy Conditioning Oil  Nivea Body Extra Enriched Lotion  Nivea Body Original Lotion  Nivea Body Sheer Moisturizing Lotion Nivea Crme  Nivea Skin Firming Lotion  NutraDerm 30 Skin Lotion  NutraDerm Skin Lotion  NutraDerm Therapeutic Skin Cream  NutraDerm Therapeutic Skin Lotion  ProShield Protective Hand Cream  Provon moisturizing lotion   PATIENT SIGNATURE_________________________________  NURSE SIGNATURE__________________________________  ________________________________________________________________________    Nasario Exon  An incentive spirometer is a tool that can help keep your lungs clear and active. This tool measures how well you are filling your lungs with each breath. Taking long deep breaths may help reverse or decrease the chance of developing breathing (pulmonary) problems (especially infection) following: A long period of time when you are unable to move or be active. BEFORE THE  PROCEDURE  If the spirometer includes an indicator to show your best effort, your nurse or respiratory therapist will set it to a desired goal. If possible, sit up straight or lean  slightly forward. Try not to slouch. Hold the incentive spirometer in an upright position. INSTRUCTIONS FOR USE  Sit on the edge of your bed if possible, or sit up as far as you can in bed or on a chair. Hold the incentive spirometer in an upright position. Breathe out normally. Place the mouthpiece in your mouth and seal your lips tightly around it. Breathe in slowly and as deeply as possible, raising the piston or the ball toward the top of the column. Hold your breath for 3-5 seconds or for as long as possible. Allow the piston or ball to fall to the bottom of the column. Remove the mouthpiece from your mouth and breathe out normally. Rest for a few seconds and repeat Steps 1 through 7 at least 10 times every 1-2 hours when you are awake. Take your time and take a few normal breaths between deep breaths. The spirometer may include an indicator to show your best effort. Use the indicator as a goal to work toward during each repetition. After each set of 10 deep breaths, practice coughing to be sure your lungs are clear. If you have an incision (the cut made at the time of surgery), support your incision when coughing by placing a pillow or rolled up towels firmly against it. Once you are able to get out of bed, walk around indoors and cough well. You may stop using the incentive spirometer when instructed by your caregiver.  RISKS AND COMPLICATIONS Take your time so you do not get dizzy or light-headed. If you are in pain, you may need to take or ask for pain medication before doing incentive spirometry. It is harder to take a deep breath if you are having pain. AFTER USE Rest and breathe slowly and easily. It can be helpful to keep track of a log of your progress. Your caregiver can provide you with a simple table  to help with this. If you are using the spirometer at home, follow these instructions: SEEK MEDICAL CARE IF:  You are having difficultly using the spirometer. You have trouble using the spirometer as often as instructed. Your pain medication is not giving enough relief while using the spirometer. You develop fever of 100.5 F (38.1 C) or higher. SEEK IMMEDIATE MEDICAL CARE IF:  You cough up bloody sputum that had not been present before. You develop fever of 102 F (38.9 C) or greater. You develop worsening pain at or near the incision site. MAKE SURE YOU:  Understand these instructions. Will watch your condition. Will get help right away if you are not doing well or get worse. Document Released: 12/15/2006 Document Revised: 10/27/2011 Document Reviewed: 02/15/2007 Exit[employer] Patient Information 2014 [employer], [place].   ________________________________________________________________________ WHAT IS A BLOOD TRANSFUSION? Blood Transfusion Information  A transfusion is the replacement of blood or some of its parts. Blood is made up of multiple cells which provide different functions. Red blood cells carry oxygen  and are used for blood loss replacement. White blood cells fight against infection. Platelets control bleeding. Plasma helps clot blood. Other blood products are available for specialized needs, such as hemophilia or other clotting disorders. BEFORE THE TRANSFUSION  Who gives blood for transfusions?  Healthy volunteers who are fully evaluated to make sure their blood is safe. This is blood bank blood. Transfusion therapy is the safest it has ever been in the practice of medicine. Before blood is taken from a donor, a complete history is taken to make sure that person has no history  of diseases nor engages in risky social behavior (examples are intravenous drug use or sexual activity with multiple partners). The donor's travel history is screened to minimize risk of transmitting  infections, such as malaria. The donated blood is tested for signs of infectious diseases, such as HIV and hepatitis. The blood is then tested to be sure it is compatible with you in order to minimize the chance of a transfusion reaction. If you or a relative donates blood, this is often done in anticipation of surgery and is not appropriate for emergency situations. It takes many days to process the donated blood. RISKS AND COMPLICATIONS Although transfusion therapy is very safe and saves many lives, the main dangers of transfusion include:  Getting an infectious disease. Developing a transfusion reaction. This is an allergic reaction to something in the blood you were given. Every precaution is taken to prevent this. The decision to have a blood transfusion has been considered carefully by your caregiver before blood is given. Blood is not given unless the benefits outweigh the risks. AFTER THE TRANSFUSION Right after receiving a blood transfusion, you will usually feel much better and more energetic. This is especially true if your red blood cells have gotten low (anemic). The transfusion raises the level of the red blood cells which carry oxygen , and this usually causes an energy increase. The nurse administering the transfusion will monitor you carefully for complications. HOME CARE INSTRUCTIONS  No special instructions are needed after a transfusion. You may find your energy is better. Speak with your caregiver about any limitations on activity for underlying diseases you may have. SEEK MEDICAL CARE IF:  Your condition is not improving after your transfusion. You develop redness or irritation at the intravenous (IV) site. SEEK IMMEDIATE MEDICAL CARE IF:  Any of the following symptoms occur over the next 12 hours: Shaking chills. You have a temperature by mouth above 102 F (38.9 C), not controlled by medicine. Chest, back, or muscle pain. People around you feel you are not acting correctly  or are confused. Shortness of breath or difficulty breathing. Dizziness and fainting. You get a rash or develop hives. You have a decrease in urine output. Your urine turns a dark color or changes to pink, red, or brown. Any of the following symptoms occur over the next 10 days: You have a temperature by mouth above 102 F (38.9 C), not controlled by medicine. Shortness of breath. Weakness after normal activity. The white part of the eye turns yellow (jaundice). You have a decrease in the amount of urine or are urinating less often. Your urine turns a dark color or changes to pink, red, or brown. Document Released: 08/01/2000 Document Revised: 10/27/2011 Document Reviewed: 03/20/2008 [facility] Patient Information 2014 [place], [place].  _______________________________________________________________________

## 2024-07-05 NOTE — Progress Notes (Signed)
 Sent message, via epic in basket, requesting orders in epic from Careers adviser.

## 2024-07-05 NOTE — Progress Notes (Addendum)
 Date of COVID positive in last 90 days:  No  PCP - Lucie Reus, MD Cardiologist - Sheppard Sierras, MD Neurologist - Harlene Bogaert, NP  Chest x-ray - N/A EKG - 07-07-24 Epic Stress Test - N/A ECHO - 08-22-21 Epic Cardiac Cath - 11-29-19 Epic Long Term Monitor - 06-27-21 Epic Pacemaker/ICD device last checked:N/A Spinal Cord Stimulator:N/A  Bowel Prep - N/A  Sleep Study - Yes, +sleep apnea CPAP - No  Fasting Blood Sugar - 110 to 118 Checks Blood Sugar - every other day  Last dose of GLP1 agonist-  N/A GLP1 instructions:  Do not take after     Last dose of SGLT-2 inhibitors-  N/A SGLT-2 instructions:  Do not take after    Blood Thinner Instructions: N/A Last dose:   Time: Aspirin  Instructions:  ASA 81 Per patient to hold x1 week per patient. Last Dose:  Activity level:  Can go up a flight of stairs and perform activities of daily living without stopping and without symptoms of chest pain or shortness of breath.  Anesthesia review: Palpitations and precordial pain evaluated by cardiology.  Parkinson's disease (mild hand tremor), DM, HTN.  No recent episodes of pain or palpitations per patient.  Bifascicular block on EKG, RBBB  Patient denies shortness of breath, fever, cough and chest pain at PAT appointment  Patient verbalized understanding of instructions that were given to them at the PAT appointment. Patient was also instructed that they will need to review over the PAT instructions again at home before surgery.

## 2024-07-05 NOTE — Telephone Encounter (Signed)
 What exactly are they needing clearance for? We follow him for parkinson's disease. Per medication list, he is on aspirin  but this is not managed by this office.

## 2024-07-05 NOTE — Telephone Encounter (Signed)
 I called Dr Raguel office back and LVM for surgery scheduler asking for call back to discuss why they are requesting clearance from neurology. Left office number in message.

## 2024-07-06 NOTE — H&P (Signed)
 PREOPERATIVE H&P  Chief Complaint: Infection of prosthetic shoulder joint, initial encounter, hardware removal  HPI: Willie Howe is a 63 y.o. male who presents for preoperative history and physical prior to scheduled surgery, Procedure(s): REVISION, REVERSE TOTAL ARTHROPLASTY, SHOULDER BIOPSY, SYNOVIUM.   Willie Howe is a 63 year old male who presents for evaluation of left shoulder pain. He has a history of chronic infection in the shoulder, which has been managed with previous surgical interventions including implant placement. The patient reports no fevers or chills. He describes a sensation of the implant 'ratcheting' or twisting. The patient has been experiencing pain and decreased function in the shoulder, which has worsened over time. He is aware of the chronic nature of the infection and the mechanical issues related to the implant loosening.   Symptoms are rated as moderate to severe, and have been worsening.  This is significantly impairing activities of daily living.    Please see clinic note for further details on this patient's care.    He has elected for surgical management.   Past Medical History:  Diagnosis Date   Arthritis    BPPV (benign paroxysmal positional vertigo)    Bradycardia    Cataract    DVT (deep venous thrombosis) (HCC)    Elevated prostate specific antigen (PSA)    Graves disease 1996   Graves disease    hx of   History of kidney stones    History of pulmonary embolism    History of substance use disorder    Hypertension    Infection of prosthetic joint    Kidney stones    Obesity    Prostate cancer [facility])    June 2025   Rheumatoid arthritis (HCC)    Managed by Dr. Jon Jacob (Rheumatology)   Sleep apnea    Intolerant of CPAP   Type 2 diabetes mellitus (HCC)    Past Surgical History:  Procedure Laterality Date   BACK SURGERY  2015   COLONOSCOPY     EXAM UNDER ANESTHESIA WITH MANIPULATION OF KNEE Right 04/18/2015   Procedure:  MANIPULATION OF RIGHT KNEE UNDER ANESTHESIA;  Surgeon: Taft FORBES Minerva, MD;  Location: [facility];  Service: Orthopedics;  Laterality: Right;   IVC Filter     removed December 2016   IVC FILTER REMOVAL  04/24/2015   KIDNEY STONE SURGERY  2010   KNEE ARTHROSCOPY Right 2006   meniscus    KNEE ARTHROSCOPY WITH MEDIAL MENISECTOMY Left 04/24/2016   Procedure: LEFT KNEE ARTHROSCOPY WITH PARTIAL MEDIAL MENISECTOMY;  Surgeon: Taft FORBES Minerva, MD;  Location: [facility];  Service: Orthopedics;  Laterality: Left;   KNEE JOINT MANIPULATION Right 04/18/2015   KNEE SURGERY     x 2   KNEE SURGERY Left 1976   ?ligament repair    LEFT HEART CATH AND CORONARY ANGIOGRAPHY N/A 11/29/2019   Procedure: LEFT HEART CATH AND CORONARY ANGIOGRAPHY;  Surgeon: Jordan, Peter M, MD;  Location: [facility] INVASIVE CV LAB;  Service: Cardiovascular;  Laterality: N/A;   LITHOTRIPSY     LUMBAR WOUND DEBRIDEMENT N/A 02/23/2014   Procedure: LUMBAR WOUND DEBRIDEMENT;  Surgeon: Darina MALVA Boehringer, MD;  [facility];  Service: Neurosurgery;  Laterality: N/A;  exploration lumbar wound. repair of dural defect.   MENISECTOMY Right    open medial    NEPHROLITHOTOMY     PERIPHERAL VASCULAR CATHETERIZATION N/A 01/30/2015   Procedure: IVC Filter Insertion;  Surgeon: Gaile LELON New, MD;  [facility]: [facility] INVAS[facility] CV LAB;  Service: Cardiovascular;  Laterality: N/A;   PERIPHERAL VASCULAR CATHETERIZATION N/A 04/24/2015   Procedure: IVC Filter Removal;  Surgeon: Gaile LELON New, MD;  [facility]: [facility] LAB;  Service: Cardiovascular;  Laterality: N/A;   PERIPHERAL VASCULAR CATHETERIZATION N/A 08/14/2015   Procedure: IVC Filter Removal;  Surgeon: Gaile LELON New, MD;  [facility]: [facility];  Service: Cardiovascular;  Laterality: N/A;   REVISION TOTAL SHOULDER TO REVERSE TOTAL SHOULDER Left 09/11/2021   Procedure: REVISION TOTAL SHOULDER TO REVERSE TOTAL SHOULDER;  Surgeon: Cristy Bonner DASEN, MD;  Location: [facility];  Service: Orthopedics;   Laterality: Left;   SHOULDER ARTHROSCOPY WITH OPEN ROTATOR CUFF REPAIR Left 11/10/2019   Procedure: SHOULDER ARTHROSCOPY WITH OPEN ROTATOR CUFF REPAIR;  Surgeon: Margrette Taft BRAVO, MD;  Location: [facility];  Service: Orthopedics;  Laterality: Left;  pt tested + on 2/23, does not need Covid test < 90 days   SHOULDER ARTHROSCOPY WITH ROTATOR CUFF REPAIR AND OPEN BICEPS TENODESIS Left 02/15/2019   Procedure: SHOULDER ARTHROSCOPY WITH open ROTATOR CUFF REPAIR AND OPEN BICEPS TENODESIS;  Surgeon: Margrette Taft BRAVO, MD;  [facility]: [facility];  Service: Orthopedics;  Laterality: Left;   SPINE SURGERY  2015   Dr Carles Fusion    SPINE SURGERY  1999   discectomy- lumbar   TOTAL KNEE ARTHROPLASTY Right 02/02/2015   Procedure:  RIGHT TOTAL KNEE ARTHROPLASTY;  Surgeon: Taft BRAVO Margrette, MD;  [facility]: [facility];  Service: Orthopedics;  Laterality: Right;  LM with pt's daughter of new arrival time (10:45)     TOTAL KNEE ARTHROPLASTY Left 07/24/2016   Procedure: TOTAL KNEE ARTHROPLASTY;  Surgeon: Taft BRAVO Margrette, MD;  [facility]: [facility];  Service: Orthopedics;  Laterality: Left;   TOTAL SHOULDER ARTHROPLASTY Left 11/08/2020   Procedure: TOTAL SHOULDER ARTHROPLASTY;  Surgeon: Onesimo Oneil LABOR, MD;  Location: [facility];  Service: Orthopedics;  Laterality: Left;  Left shoulder Reverse Arthroplasty   ureteroscopic stone manipulation Left 08/08/2021   Social History   Socioeconomic History   Marital status: Married    Spouse name: Not on file   Number of children: 4   Years of education: College   Highest education level: Not on file  Occupational History   Occupation: N/A  Tobacco Use   Smoking status: Former    Current packs/day: 0.00    Average packs/day: 1.5 packs/day for 20.0 years (30.0 ttl pk-yrs)    Types: Cigarettes    Start date: 02/03/1975    Quit date: 02/03/1995    Years since quitting: 29.4   Smokeless tobacco: Never  Vaping Use   Vaping status: Never Used  Substance and Sexual Activity    Alcohol  use: No    Alcohol /week: 0.0 standard drinks of alcohol    Drug use: No    Comment: quit heroin and cocaine in 1994   Sexual activity: Yes  Other Topics Concern   Not on file  Social History Narrative   Drinks about 1 cup of coffee a day    Social Drivers of Corporate Investment Banker Strain: Not on file  Food Insecurity: Low Risk  (03/25/2024)   Received from [facility]   Hunger Vital Sign    Within the past 12 months, you worried that your food would run out before you got money to buy more: Never true    Within the past 12 months, the food you bought just didn't last and you didn't have money to get more. : Never true  Transportation Needs: No Transportation Needs (03/25/2024)  Received from [facility]    In the past 12 months, has lack of reliable transportation kept you from medical appointments, meetings, work or from getting things needed for daily living? : No  Physical Activity: Not on file  Stress: Not on file  Social Connections: Not on file   Family History  Problem Relation Age of Onset   Deep vein thrombosis Mother    Hypertension Mother    Breast cancer Mother    Hypertension Father    Prostate cancer Father    Colon cancer Father    Breast cancer Sister    CAD Sister    Stroke Maternal Aunt    Prostate cancer Paternal Uncle    Tremor Maternal Grandmother    Lung cancer Other    Glaucoma Neg Hx    Parkinson's disease Neg Hx    Allergies  Allergen Reactions   Lisinopril Cough   Statins Other (See Comments)    Muscle pain   Prior to Admission medications   Medication Sig Start Date End Date Taking? Authorizing Provider  acetaminophen  (TYLENOL ) 650 MG CR tablet Take 1,300 mg by mouth every 8 (eight) hours as needed for pain.   Yes [provider]  amLODipine  (NORVASC ) 10 MG tablet Take 5 mg by mouth daily. 09/24/23  Yes [provider]  aspirin  81 MG EC tablet Take 81 mg by mouth daily.   Yes [provider]  docusate sodium  (COLACE) 100 MG capsule Take 200 mg by mouth every other day.   Yes [provider]  folic acid  (FOLVITE ) 1 MG tablet Take 1 mg by mouth daily.   Yes [provider]  HYDROcodone -acetaminophen  (NORCO) 7.5-325 MG tablet Take 1 tablet by mouth every 6 (six) hours as needed for pain. 07/13/21  Yes [provider]  loratadine  (CLARITIN ) 10 MG tablet Take 10 mg by mouth daily.   Yes [provider]  metFORMIN  (GLUCOPHAGE -XR) 500 MG 24 hr tablet Take 500 mg by mouth 2 (two) times daily. 06/13/24  Yes [provider]  Methotrexate  Sodium (METHOTREXATE , PF,) 50 MG/2ML injection Inject 0.8 mLs into the muscle once a week. 06/18/19  Yes [provider]  predniSONE  (DELTASONE ) 5 MG tablet Take 5 mg by mouth daily.   Yes [provider]  rOPINIRole  (REQUIP ) 1 MG tablet Take 1.5 tablets (1.5 mg total) by mouth 3 (three) times daily. 12/30/23  Yes McCue, Harlene, NP  tamsulosin  (FLOMAX ) 0.4 MG CAPS capsule Take 0.4 mg by mouth at bedtime.    Yes [provider]  amLODipine  (NORVASC ) 5 MG tablet TAKE 1 TABLET (5 MG TOTAL) BY MOUTH DAILY. Patient not taking: Reported on 07/04/2024 09/04/23   Debera Jayson MATSU, MD  gabapentin  (NEURONTIN ) 100 MG capsule Take 1 capsule (100 mg total) by mouth 3 (three) times daily. For pain Patient not taking: Reported on 07/04/2024 09/12/21   Kalin Kyler N, PA-C  hydrOXYzine  (ATARAX ) 25 MG tablet Take 1 tablet (25 mg total) by mouth every 8 (eight) hours as needed for up to 20 doses for itching. Patient not taking: Reported on 07/04/2024 12/05/22   Ethyl Richerd BROCKS, MD  inFLIXimab -abda (RENFLEXIS  IV) Inject 1 Dose into the vein every 6 (six) weeks.    [provider]  oxyCODONE  (ROXICODONE ) 5 MG immediate release tablet Take 1 tablet (5 mg total) by mouth every 6 (six) hours as needed for up to 10 doses for severe pain. Patient not taking: Reported on 07/04/2024  12/05/22  Ethyl Richerd BROCKS, MD  valACYclovir  (VALTREX ) 1000 MG tablet Take 1 tablet (1,000 mg total) by mouth 3 (three) times daily. Patient not taking: Reported on 07/04/2024 12/05/22   Ethyl Richerd BROCKS, MD    ROS: All other systems have been reviewed and were otherwise negative with the exception of those mentioned in the HPI and as above.  Physical Exam: General: Alert, no acute distress Cardiovascular: No pedal edema Respiratory: No cyanosis, no use of accessory musculature GI: No organomegaly, abdomen is soft and non-tender Skin: No lesions in the area of chief complaint Neurologic: Sensation intact distally Psychiatric: Patient is competent for consent with normal mood and affect Lymphatic: No axillary or cervical lymphadenopathy  MUSCULOSKELETAL:  Active forward elevation to 120 degrees, passive to 150 degrees, external rotation to 30 degrees. No signs of acute infection such as fever or chills were noted.    Imaging: CT of the left shoulder demonstrates loosening of the humeral implant. The glenoid appears normal with no obvious effusion or fluid collection. No fracture is observed. Independent interpretation performed by me confirms these findings.  BMI: Estimated body mass index is 32.14 kg/m as calculated from the following:   Height as of 03/31/24: 6' 3.5 (1.918 m).   Weight as of 03/31/24: 118.2 kg.  Lab Results  Component Value Date   ALBUMIN  4.0 12/05/2022   Diabetes:   Patient has a diagnosis of diabetes,  Lab Results  Component Value Date   HGBA1C 6.3 (H) 09/05/2021   Smoking Status:      Assessment: Infection of prosthetic shoulder joint, initial encounter, hardware removal  Plan: Plan for Procedure(s): REVISION, REVERSE TOTAL ARTHROPLASTY, SHOULDER BIOPSY, SYNOVIUM  The risks benefits and alternatives were discussed with the patient including but not limited to the risks of nonoperative treatment, versus surgical intervention including  infection, bleeding, nerve injury,  blood clots, cardiopulmonary complications, morbidity, mortality, among others, and they were willing to proceed.   We additionally specifically discussed risks of axillary nerve injury, infection, periprosthetic fracture, continued pain and longevity of implants prior to beginning procedure.    Patient will be admitted for inpatient treatment for surgery, pain control, OT, prophylactic antibiotics, VTE prophylaxis, and discharge planning. The patient is planning to be discharged home with outpatient PT.   The patient acknowledged the explanation, agreed to proceed with the plan and consent was signed.   Operative Plan: Left shoulder revision to reverse total shoulder arthroplasty  Discharge Medications: standard DVT Prophylaxis: aspirin  Physical Therapy: delayed Special Discharge needs: Sling   Aleck LOISE Stalling, PA-C  07/06/2024 1:45 PM

## 2024-07-07 ENCOUNTER — Encounter (HOSPITAL_COMMUNITY)
Admission: RE | Admit: 2024-07-07 | Discharge: 2024-07-07 | Disposition: A | Discharge status: Home / Self Care | Source: Ambulatory Visit | Attending: Orthopaedic Surgery | Admitting: Orthopaedic Surgery

## 2024-07-07 ENCOUNTER — Other Ambulatory Visit: Payer: Self-pay

## 2024-07-07 ENCOUNTER — Encounter (HOSPITAL_COMMUNITY): Payer: Self-pay

## 2024-07-07 VITALS — BP 136/75 | HR 80 | Temp 98.3°F | Resp 16 | Ht 75.5 in | Wt 255.8 lb

## 2024-07-07 DIAGNOSIS — I10 Essential (primary) hypertension: Secondary | ICD-10-CM | POA: Diagnosis not present

## 2024-07-07 DIAGNOSIS — G4733 Obstructive sleep apnea (adult) (pediatric): Secondary | ICD-10-CM | POA: Insufficient documentation

## 2024-07-07 DIAGNOSIS — Z79899 Other long term (current) drug therapy: Secondary | ICD-10-CM | POA: Insufficient documentation

## 2024-07-07 DIAGNOSIS — Z79620 Long term (current) use of immunosuppressive biologic: Secondary | ICD-10-CM | POA: Diagnosis not present

## 2024-07-07 DIAGNOSIS — Z96612 Presence of left artificial shoulder joint: Secondary | ICD-10-CM | POA: Insufficient documentation

## 2024-07-07 DIAGNOSIS — Z01810 Encounter for preprocedural cardiovascular examination: Secondary | ICD-10-CM | POA: Diagnosis present

## 2024-07-07 DIAGNOSIS — Z87891 Personal history of nicotine dependence: Secondary | ICD-10-CM | POA: Diagnosis not present

## 2024-07-07 DIAGNOSIS — E0500 Thyrotoxicosis with diffuse goiter without thyrotoxic crisis or storm: Secondary | ICD-10-CM | POA: Insufficient documentation

## 2024-07-07 DIAGNOSIS — Z86711 Personal history of pulmonary embolism: Secondary | ICD-10-CM | POA: Insufficient documentation

## 2024-07-07 DIAGNOSIS — Z981 Arthrodesis status: Secondary | ICD-10-CM | POA: Insufficient documentation

## 2024-07-07 DIAGNOSIS — Z923 Personal history of irradiation: Secondary | ICD-10-CM | POA: Insufficient documentation

## 2024-07-07 DIAGNOSIS — E119 Type 2 diabetes mellitus without complications: Secondary | ICD-10-CM | POA: Diagnosis not present

## 2024-07-07 DIAGNOSIS — G20A1 Parkinson's disease without dyskinesia, without mention of fluctuations: Secondary | ICD-10-CM | POA: Insufficient documentation

## 2024-07-07 DIAGNOSIS — M069 Rheumatoid arthritis, unspecified: Secondary | ICD-10-CM | POA: Diagnosis not present

## 2024-07-07 DIAGNOSIS — T8459XA Infection and inflammatory reaction due to other internal joint prosthesis, initial encounter: Secondary | ICD-10-CM | POA: Diagnosis not present

## 2024-07-07 DIAGNOSIS — Z8546 Personal history of malignant neoplasm of prostate: Secondary | ICD-10-CM | POA: Insufficient documentation

## 2024-07-07 DIAGNOSIS — Z86718 Personal history of other venous thrombosis and embolism: Secondary | ICD-10-CM | POA: Insufficient documentation

## 2024-07-07 DIAGNOSIS — M1990 Unspecified osteoarthritis, unspecified site: Secondary | ICD-10-CM | POA: Insufficient documentation

## 2024-07-07 DIAGNOSIS — Z01812 Encounter for preprocedural laboratory examination: Secondary | ICD-10-CM | POA: Diagnosis present

## 2024-07-07 DIAGNOSIS — Z01818 Encounter for other preprocedural examination: Secondary | ICD-10-CM | POA: Diagnosis not present

## 2024-07-07 HISTORY — DX: Parkinson's disease without dyskinesia, without mention of fluctuations: G20.A1

## 2024-07-07 LAB — GLUCOSE, CAPILLARY: Glucose-Capillary: 108 mg/dL — ABNORMAL HIGH (ref 70–99)

## 2024-07-07 LAB — BASIC METABOLIC PANEL WITH GFR
Anion gap: 10 (ref 5–15)
BUN: 13 mg/dL (ref 8–23)
CO2: 24 mmol/L (ref 22–32)
Calcium: 9.5 mg/dL (ref 8.9–10.3)
Chloride: 103 mmol/L (ref 98–111)
Creatinine, Ser: 0.85 mg/dL (ref 0.61–1.24)
GFR, Estimated: >60 mL/min (ref >60)
Glucose, Bld: 111 mg/dL — ABNORMAL HIGH (ref 70–99)
Potassium: 4.1 mmol/L (ref 3.5–5.1)
Sodium: 136 mmol/L (ref 135–145)

## 2024-07-07 LAB — SURGICAL PCR SCREEN
MRSA, PCR: NEGATIVE
Staphylococcus aureus: NEGATIVE

## 2024-07-07 LAB — CBC
HCT: 41.1 % (ref 39.0–52.0)
Hemoglobin: 13.2 g/dL (ref 13.0–17.0)
MCH: 30.3 pg (ref 26.0–34.0)
MCHC: 32.1 g/dL (ref 30.0–36.0)
MCV: 94.3 fL (ref 80.0–100.0)
Platelets: 234 K/uL (ref 150–400)
RBC: 4.36 MIL/uL (ref 4.22–5.81)
RDW: 15.2 % (ref 11.5–15.5)
WBC: 6.3 K/uL (ref 4.0–10.5)
nRBC: 0 % (ref 0.0–0.2)

## 2024-07-07 LAB — HEMOGLOBIN A1C
Hgb A1c MFr Bld: 6 % — ABNORMAL HIGH (ref 4.8–5.6)
Mean Plasma Glucose: 125.5 mg/dL

## 2024-07-07 NOTE — Telephone Encounter (Signed)
 It is not urgent, can wait for your return

## 2024-07-07 NOTE — Telephone Encounter (Signed)
 Called and spoke to surgery scheduler who stated that its for revision left total shoulder arthroplasty. They need a clearance due to him having parksons. They stated that is required for surgery. All it needs to say according to them is stable from parkinson standpoint

## 2024-07-07 NOTE — Telephone Encounter (Signed)
 Form can be completed but I will not be in office to sign until Monday. Can also be signed by Dr. Buck if she is in office today or tomorrow.

## 2024-07-08 ENCOUNTER — Encounter (HOSPITAL_COMMUNITY): Payer: Self-pay

## 2024-07-08 NOTE — Progress Notes (Signed)
 Case: [id number] Date/Time: 07/13/24 1510   Procedures:      REVISION, REVERSE TOTAL ARTHROPLASTY, SHOULDER (Left: Shoulder) - revision total shoulder arthroplasty, synovectomy     BIOPSY, SYNOVIUM (Left)   Anesthesia type: General   Diagnosis:      Infection of prosthetic shoulder joint, initial encounter [T84.59XA, Z96.619]     Status post replacement of left shoulder joint [Z96.612]   Pre-op diagnosis: Infection of prosthetic shoulder joint, initial encounter, hardware removal   [facility] 10 / [facility]   Surgeons: Cristy Bonner DASEN, MD       DISCUSSION: Willie Howe is a 63 yo male with PMH of former smoking, HTN, Parkinsons disease, OSA (intolerant of CPAP), Graves disease, hx of DVT/PE, T2DM (A1c 6.0), arthritis (RA, OA s/p multiple joint surgeries), prostate cancer s/p radiation (03/2024), s/p lumbar fusion L3-4 (2015)  Hx of Parkinson's disease, followed by Neurology. Stable on Ropinorole at last office visit on 03/31/24.   Hx of RA (on methotrexate , Remicade and prednisone )  Hx of back surgery. Lumbar fusion in 8/15 and spinal fluid leakage repair about a week later.  Evaluated by Cardiology for chest pain and palpations in the past. He had a normal cardiac cath in 2021. Cardiac monitor from November 2022 showing normal sinus rhythm with rare PACs and PVCs with a single 4 beat run of SVT. Echo in 08/2021 showed normal LVEF of 60 to 65% with no major valvular abnormalities  VS: BP 136/75   Pulse 80   Temp 36.8 C (Oral)   Resp 16   Ht 6' 3.5 (1.918 m)   Wt 116 kg   SpO2 98%   BMI 31.55 kg/m   PROVIDERS: Leila Lucie LABOR, MD   LABS: Labs reviewed: Acceptable for surgery. (all labs ordered are listed, but only abnormal results are displayed)  Labs Reviewed  HEMOGLOBIN A1C - Abnormal; Notable for the following components:      Result Value   Hgb A1c MFr Bld 6.0 (*)    All other components within normal limits  BASIC METABOLIC PANEL WITH GFR - Abnormal; Notable for  the following components:   Glucose, Bld 111 (*)    All other components within normal limits  GLUCOSE, CAPILLARY - Abnormal; Notable for the following components:   Glucose-Capillary 108 (*)    All other components within normal limits  SURGICAL PCR SCREEN  CBC  TYPE AND SCREEN     IMAGES:   EKG 07/07/24:  Normal sinus rhythm Right bundle branch block Left anterior fascicular block Bifascicular block Abnormal ECG  Echo 08/22/2021:  IMPRESSIONS    1. Left ventricular ejection fraction, by estimation, is 60 to 65%. The left ventricle has normal function. The left ventricle has no regional wall motion abnormalities. There is mild left ventricular hypertrophy. Left ventricular diastolic parameters were normal.  2. Right ventricular systolic function is normal. The right ventricular size is normal. There is normal pulmonary artery systolic pressure. The estimated right ventricular systolic pressure is 27.2 mmHg.  3. Left atrial size was upper normal.  4. The mitral valve is grossly normal. Trivial mitral valve regurgitation.  5. The aortic valve is tricuspid. Aortic valve regurgitation is not visualized.  6. The inferior vena cava is normal in size with greater than 50% respiratory variability, suggesting right atrial pressure of 3 mmHg.  LHC 11/29/2019:   LV end diastolic pressure is normal.   1. Normal coronary anatomy 2. Normal LV EDP Past Medical History:  Diagnosis Date  Arthritis    BPPV (benign paroxysmal positional vertigo)    Bradycardia    Cataract    DVT (deep venous thrombosis) (HCC)    Elevated prostate specific antigen (PSA)    Graves disease 1996   Graves disease    hx of   History of kidney stones    History of pulmonary embolism    History of substance use disorder    Hypertension    Infection of prosthetic joint    Kidney stones    Obesity    Parkinson disease (HCC)    Prostate cancer (HCC)    June 2025   Rheumatoid arthritis (HCC)     Managed by Dr. Jon Jacob (Rheumatology)   Sleep apnea    Intolerant of CPAP   Type 2 diabetes mellitus (HCC)     Past Surgical History:  Procedure Laterality Date   BACK SURGERY  2015   COLONOSCOPY     EXAM UNDER ANESTHESIA WITH MANIPULATION OF KNEE Right 04/18/2015   Procedure: MANIPULATION OF RIGHT KNEE UNDER ANESTHESIA;  Surgeon: Taft FORBES Minerva, MD;  [facility];  Service: Orthopedics;  Laterality: Right;   IVC Filter     removed December 2016   IVC FILTER REMOVAL  04/24/2015   KIDNEY STONE SURGERY  2010   KNEE ARTHROSCOPY Right 2006   meniscus    KNEE ARTHROSCOPY WITH MEDIAL MENISECTOMY Left 04/24/2016   Procedure: LEFT KNEE ARTHROSCOPY WITH PARTIAL MEDIAL MENISECTOMY;  Surgeon: Taft FORBES Minerva, MD;  Location: [facility];  Service: Orthopedics;  Laterality: Left;   KNEE JOINT MANIPULATION Right 04/18/2015   KNEE SURGERY     x 2   KNEE SURGERY Left 1976   ?ligament repair    LEFT HEART CATH AND CORONARY ANGIOGRAPHY N/A 11/29/2019   Procedure: LEFT HEART CATH AND CORONARY ANGIOGRAPHY;  Surgeon: Jordan, Peter M, MD;  [facility] INVASIVE CV LAB;  Service: Cardiovascular;  Laterality: N/A;   LITHOTRIPSY     LUMBAR WOUND DEBRIDEMENT N/A 02/23/2014   Procedure: LUMBAR WOUND DEBRIDEMENT;  Surgeon: Darina MALVA Boehringer, MD;  [facility];  Service: Neurosurgery;  Laterality: N/A;  exploration lumbar wound. repair of dural defect.   MENISECTOMY Right    open medial    NEPHROLITHOTOMY     PERIPHERAL VASCULAR CATHETERIZATION N/A 01/30/2015   Procedure: IVC Filter Insertion;  Surgeon: Gaile LELON New, MD;  [facility];  Service: Cardiovascular;  Laterality: N/A;   PERIPHERAL VASCULAR CATHETERIZATION N/A 04/24/2015   Procedure: IVC Filter Removal;  Surgeon: Gaile LELON New, MD;  [facility]: [facility];  Service: Cardiovascular;  Laterality: N/A;   PERIPHERAL VASCULAR CATHETERIZATION N/A 08/14/2015   Procedure: IVC Filter Removal;  Surgeon: Gaile LELON New, MD;  [facility];  Service: Cardiovascular;  Laterality: N/A;   REVISION TOTAL SHOULDER TO REVERSE TOTAL SHOULDER Left 09/11/2021   Procedure: REVISION TOTAL SHOULDER TO REVERSE TOTAL SHOULDER;  Surgeon: Cristy Bonner DASEN, MD;  [facility]: [facility];  Service: Orthopedics;  Laterality: Left;   SHOULDER ARTHROSCOPY WITH OPEN ROTATOR CUFF REPAIR Left 11/10/2019   Procedure: SHOULDER ARTHROSCOPY WITH OPEN ROTATOR CUFF REPAIR;  Surgeon: Minerva Taft FORBES, MD;  Location: [facility];  Service: Orthopedics;  Laterality: Left;  pt tested + on 2/23, does not need Covid test < 90 days   SHOULDER ARTHROSCOPY WITH ROTATOR CUFF REPAIR AND OPEN BICEPS TENODESIS Left 02/15/2019   Procedure: SHOULDER ARTHROSCOPY WITH open ROTATOR CUFF REPAIR AND OPEN BICEPS TENODESIS;  Surgeon:  Margrette Taft BRAVO, MD;  [facility]: [facility];  Service: Orthopedics;  Laterality: Left;   SPINE SURGERY  2015   Dr Carles Fusion    SPINE SURGERY  1999   discectomy- lumbar   TOTAL KNEE ARTHROPLASTY Right 02/02/2015   Procedure:  RIGHT TOTAL KNEE ARTHROPLASTY;  Surgeon: Taft BRAVO Margrette, MD;  [facility]: [facility];  Service: Orthopedics;  Laterality: Right;  LM with pt's daughter of new arrival time (10:45)     TOTAL KNEE ARTHROPLASTY Left 07/24/2016   Procedure: TOTAL KNEE ARTHROPLASTY;  Surgeon: Taft BRAVO Margrette, MD;  Location: [facility];  Service: Orthopedics;  Laterality: Left;   TOTAL SHOULDER ARTHROPLASTY Left 11/08/2020   Procedure: TOTAL SHOULDER ARTHROPLASTY;  Surgeon: Onesimo Oneil LABOR, MD;  Location: [facility];  Service: Orthopedics;  Laterality: Left;  Left shoulder Reverse Arthroplasty   ureteroscopic stone manipulation Left 08/08/2021    MEDICATIONS:  acetaminophen  (TYLENOL ) 650 MG CR tablet   amLODipine  (NORVASC ) 10 MG tablet   amLODipine  (NORVASC ) 5 MG tablet   aspirin  81 MG EC tablet   docusate sodium  (COLACE) 100 MG capsule   folic acid  (FOLVITE ) 1 MG tablet   gabapentin  (NEURONTIN ) 100 MG capsule    HYDROcodone -acetaminophen  (NORCO) 7.5-325 MG tablet   hydrOXYzine  (ATARAX ) 25 MG tablet   inFLIXimab -abda (RENFLEXIS  IV)   loratadine  (CLARITIN ) 10 MG tablet   metFORMIN  (GLUCOPHAGE -XR) 500 MG 24 hr tablet   Methotrexate  Sodium (METHOTREXATE , PF,) 50 MG/2ML injection   oxyCODONE  (ROXICODONE ) 5 MG immediate release tablet   predniSONE  (DELTASONE ) 5 MG tablet   rOPINIRole  (REQUIP ) 1 MG tablet   tamsulosin  (FLOMAX ) 0.4 MG CAPS capsule   valACYclovir  (VALTREX ) 1000 MG tablet   No current facility-administered medications for this encounter.   Burnard CHRISTELLA Odis DEVONNA [facility] Short Stay/Anesthesiology [facility] Phone [phone] 07/08/2024 12:12 PM

## 2024-07-08 NOTE — Anesthesia Preprocedure Evaluation (Signed)
 Anesthesia Evaluation  Patient identified by MRN, date of birth, ID band Patient awake    Reviewed: Allergy & Precautions, NPO status , Patient's Chart, lab work & pertinent test results, reviewed documented beta blocker date and time   Airway Mallampati: II  TM Distance: >3 FB     Dental  (+) Teeth Intact, Dental Advisory Given   Pulmonary sleep apnea and Continuous Positive Airway Pressure Ventilation , former smoker   Pulmonary exam normal breath sounds clear to auscultation       Cardiovascular hypertension, Pt. on medications Normal cardiovascular exam Rhythm:Regular Rate:Normal  EKG 07/07/24 NSR, RBBB + LAFB  Echo 08/22/21 1. Left ventricular ejection fraction, by estimation, is 60 to 65%. The  left ventricle has normal function. The left ventricle has no regional  wall motion abnormalities. There is mild left ventricular hypertrophy.  Left ventricular diastolic parameters  were normal.   2. Right ventricular systolic function is normal. The right ventricular  size is normal. There is normal pulmonary artery systolic pressure. The  estimated right ventricular systolic pressure is 27.2 mmHg.   3. Left atrial size was upper normal.   4. The mitral valve is grossly normal. Trivial mitral valve  regurgitation.   5. The aortic valve is tricuspid. Aortic valve regurgitation is not  visualized.   6. The inferior vena cava is normal in size with greater than 50%  respiratory variability, suggesting right atrial pressure of 3 mmHg.   Cardiac Cath 11/29/19  LV end diastolic pressure is normal.   1. Normal coronary anatomy 2. Normal LV EDP      Neuro/Psych negative neurological ROS  negative psych ROS   GI/Hepatic negative GI ROS, Neg liver ROS,,,  Endo/Other  diabetes, Well Controlled, Type 2, Oral Hypoglycemic Agents  Obesity HLD   Renal/GU Renal diseaseHx/o renal calculi Lab Results      Component                 Value               Date                      NA                       136                 07/07/2024                CL                       103                 07/07/2024                K                        4.1                 07/07/2024                CO2                      24                  07/07/2024  BUN                      13                  07/07/2024                CREATININE               0.85                07/07/2024                GFRNONAA                 >60                 07/07/2024                CALCIUM                  9.5                 07/07/2024                ALBUMIN                   4.0                 12/05/2022                GLUCOSE                  111 (H)             07/07/2024             negative genitourinary   Musculoskeletal  (+) Arthritis , Osteoarthritis,  Infection of prosthetic shoulder joint, initial encounter  Status post replacement of left shoulder joint    Abdominal  (+) + obese  Peds  Hematology Lab Results      Component                Value               Date                      WBC                      6.3                 07/07/2024                HGB                      13.2                07/07/2024                HCT                      41.1                07/07/2024                MCV                      94.3                07/07/2024  PLT                      234                 07/07/2024              Anesthesia Other Findings   Reproductive/Obstetrics                              Anesthesia Physical Anesthesia Plan  ASA: 3  Anesthesia Plan: General   Post-op Pain Management: Regional block*, Minimal or no pain anticipated, Dilaudid  IV, Precedex  and Ofirmev  IV (intra-op)*   Induction: Intravenous  PONV Risk Score and Plan: 3 and Treatment may vary due to age or medical condition, Ondansetron , Midazolam  and Dexamethasone   Airway Management Planned:  Oral ETT  Additional Equipment: None  Intra-op Plan:   Post-operative Plan: Extubation in OR  Informed Consent: I have reviewed the patients History and Physical, chart, labs and discussed the procedure including the risks, benefits and alternatives for the proposed anesthesia with the patient or authorized representative who has indicated his/her understanding and acceptance.     Dental advisory given  Plan Discussed with: CRNA and Anesthesiologist  Anesthesia Plan Comments: (See PAT note from 11/20)         Anesthesia Quick Evaluation

## 2024-07-11 NOTE — Telephone Encounter (Signed)
 Completed and placed in POD 3. Thank you.

## 2024-07-12 NOTE — Telephone Encounter (Signed)
 Clearance form faxed back to [facility]. [phone] Received a receipt of confirmation.

## 2024-07-13 ENCOUNTER — Encounter (HOSPITAL_COMMUNITY)
Admission: RE | Disposition: A | Discharge status: Home Health | Payer: Self-pay | Source: Home / Self Care | Attending: Orthopaedic Surgery

## 2024-07-13 ENCOUNTER — Inpatient Hospital Stay (HOSPITAL_COMMUNITY)

## 2024-07-13 ENCOUNTER — Other Ambulatory Visit: Payer: Self-pay

## 2024-07-13 ENCOUNTER — Inpatient Hospital Stay (HOSPITAL_COMMUNITY)
Admission: RE | Admit: 2024-07-13 | Discharge: 2024-07-15 | DRG: 483 | Disposition: A | Discharge status: Home / Self Care | Attending: Orthopaedic Surgery | Admitting: Orthopaedic Surgery

## 2024-07-13 ENCOUNTER — Inpatient Hospital Stay (HOSPITAL_COMMUNITY): Payer: Self-pay | Admitting: Physician Assistant

## 2024-07-13 ENCOUNTER — Inpatient Hospital Stay (HOSPITAL_COMMUNITY): Admitting: Anesthesiology

## 2024-07-13 ENCOUNTER — Encounter (HOSPITAL_COMMUNITY): Payer: Self-pay | Admitting: Orthopaedic Surgery

## 2024-07-13 DIAGNOSIS — T8450XA Infection and inflammatory reaction due to unspecified internal joint prosthesis, initial encounter: Secondary | ICD-10-CM | POA: Diagnosis not present

## 2024-07-13 DIAGNOSIS — E119 Type 2 diabetes mellitus without complications: Secondary | ICD-10-CM

## 2024-07-13 DIAGNOSIS — Z79899 Other long term (current) drug therapy: Secondary | ICD-10-CM | POA: Diagnosis not present

## 2024-07-13 DIAGNOSIS — G4730 Sleep apnea, unspecified: Secondary | ICD-10-CM | POA: Diagnosis present

## 2024-07-13 DIAGNOSIS — Z87891 Personal history of nicotine dependence: Secondary | ICD-10-CM

## 2024-07-13 DIAGNOSIS — Z8546 Personal history of malignant neoplasm of prostate: Secondary | ICD-10-CM | POA: Diagnosis not present

## 2024-07-13 DIAGNOSIS — I10 Essential (primary) hypertension: Secondary | ICD-10-CM | POA: Diagnosis present

## 2024-07-13 DIAGNOSIS — T84038A Mechanical loosening of other internal prosthetic joint, initial encounter: Principal | ICD-10-CM | POA: Diagnosis present

## 2024-07-13 DIAGNOSIS — Z7952 Long term (current) use of systemic steroids: Secondary | ICD-10-CM

## 2024-07-13 DIAGNOSIS — E669 Obesity, unspecified: Secondary | ICD-10-CM | POA: Diagnosis present

## 2024-07-13 DIAGNOSIS — M1990 Unspecified osteoarthritis, unspecified site: Secondary | ICD-10-CM | POA: Diagnosis present

## 2024-07-13 DIAGNOSIS — M069 Rheumatoid arthritis, unspecified: Secondary | ICD-10-CM | POA: Diagnosis present

## 2024-07-13 DIAGNOSIS — Z8249 Family history of ischemic heart disease and other diseases of the circulatory system: Secondary | ICD-10-CM | POA: Diagnosis not present

## 2024-07-13 DIAGNOSIS — Z79631 Long term (current) use of antimetabolite agent: Secondary | ICD-10-CM

## 2024-07-13 DIAGNOSIS — Z8042 Family history of malignant neoplasm of prostate: Secondary | ICD-10-CM | POA: Diagnosis not present

## 2024-07-13 DIAGNOSIS — T8459XD Infection and inflammatory reaction due to other internal joint prosthesis, subsequent encounter: Secondary | ICD-10-CM

## 2024-07-13 DIAGNOSIS — Y792 Prosthetic and other implants, materials and accessory orthopedic devices associated with adverse incidents: Secondary | ICD-10-CM | POA: Diagnosis present

## 2024-07-13 DIAGNOSIS — Z7982 Long term (current) use of aspirin: Secondary | ICD-10-CM

## 2024-07-13 DIAGNOSIS — M25512 Pain in left shoulder: Secondary | ICD-10-CM | POA: Diagnosis present

## 2024-07-13 DIAGNOSIS — Z86711 Personal history of pulmonary embolism: Secondary | ICD-10-CM

## 2024-07-13 DIAGNOSIS — Z923 Personal history of irradiation: Secondary | ICD-10-CM

## 2024-07-13 DIAGNOSIS — Z7984 Long term (current) use of oral hypoglycemic drugs: Secondary | ICD-10-CM

## 2024-07-13 DIAGNOSIS — T8459XA Infection and inflammatory reaction due to other internal joint prosthesis, initial encounter: Secondary | ICD-10-CM | POA: Diagnosis not present

## 2024-07-13 DIAGNOSIS — G20A1 Parkinson's disease without dyskinesia, without mention of fluctuations: Secondary | ICD-10-CM | POA: Diagnosis present

## 2024-07-13 DIAGNOSIS — Z6831 Body mass index (BMI) 31.0-31.9, adult: Secondary | ICD-10-CM | POA: Diagnosis not present

## 2024-07-13 DIAGNOSIS — Z01818 Encounter for other preprocedural examination: Principal | ICD-10-CM

## 2024-07-13 DIAGNOSIS — Z87442 Personal history of urinary calculi: Secondary | ICD-10-CM | POA: Diagnosis not present

## 2024-07-13 DIAGNOSIS — Y838 Other surgical procedures as the cause of abnormal reaction of the patient, or of later complication, without mention of misadventure at the time of the procedure: Secondary | ICD-10-CM | POA: Diagnosis present

## 2024-07-13 DIAGNOSIS — Z96651 Presence of right artificial knee joint: Secondary | ICD-10-CM | POA: Diagnosis present

## 2024-07-13 DIAGNOSIS — Z96612 Presence of left artificial shoulder joint: Secondary | ICD-10-CM

## 2024-07-13 HISTORY — PX: REVISION TOTAL SHOULDER TO REVERSE TOTAL SHOULDER: SHX6313

## 2024-07-13 HISTORY — PX: SYNOVIAL BIOPSY: SHX5041

## 2024-07-13 LAB — CBC
HCT: 41.8 % (ref 39.0–52.0)
Hemoglobin: 13.5 g/dL (ref 13.0–17.0)
MCH: 30.3 pg (ref 26.0–34.0)
MCHC: 32.3 g/dL (ref 30.0–36.0)
MCV: 93.9 fL (ref 80.0–100.0)
Platelets: 233 K/uL (ref 150–400)
RBC: 4.45 MIL/uL (ref 4.22–5.81)
RDW: 14.6 % (ref 11.5–15.5)
WBC: 11.6 K/uL — ABNORMAL HIGH (ref 4.0–10.5)
nRBC: 0 % (ref 0.0–0.2)

## 2024-07-13 LAB — TYPE AND SCREEN
ABO/RH(D): A POS
ABO/RH(D): A POS
Antibody Screen: NEGATIVE
Antibody Screen: NEGATIVE

## 2024-07-13 LAB — GLUCOSE, CAPILLARY
Glucose-Capillary: 111 mg/dL — ABNORMAL HIGH (ref 70–99)
Glucose-Capillary: 110 mg/dL — ABNORMAL HIGH (ref 70–99)
Glucose-Capillary: 259 mg/dL — ABNORMAL HIGH (ref 70–99)
Glucose-Capillary: 238 mg/dL — ABNORMAL HIGH (ref 70–99)

## 2024-07-13 LAB — CREATININE, SERUM
Creatinine, Ser: 0.81 mg/dL (ref 0.61–1.24)
GFR, Estimated: >60 mL/min (ref >60)

## 2024-07-13 SURGERY — REVISION, REVERSE TOTAL ARTHROPLASTY, SHOULDER
Anesthesia: General | Site: Shoulder | Laterality: Left

## 2024-07-13 MED ORDER — STERILE WATER FOR IRRIGATION IR SOLN
Status: DC | PRN
  Administered 2024-07-13: 2000 mL

## 2024-07-13 MED ORDER — 0.9 % SODIUM CHLORIDE (POUR BTL) OPTIME
TOPICAL | Status: DC | PRN
Start: 2024-07-13 — End: 2024-07-13
  Administered 2024-07-13: 1000 mL

## 2024-07-13 MED ORDER — INSULIN ASPART 100 UNIT/ML IJ SOLN
0.0000 [IU] | Freq: Every day | INTRAMUSCULAR | Status: DC
  Administered 2024-07-13: 2 [IU] via SUBCUTANEOUS
  Filled 2024-07-13: qty 2

## 2024-07-13 MED ORDER — ONDANSETRON HCL 4 MG/2ML IJ SOLN
INTRAMUSCULAR | Status: DC | PRN
  Administered 2024-07-13: 4 mg via INTRAVENOUS

## 2024-07-13 MED ORDER — FENTANYL CITRATE (PF) 50 MCG/ML IJ SOSY
25.0000 ug | PREFILLED_SYRINGE | INTRAMUSCULAR | Status: AC
  Administered 2024-07-13: 50 ug via INTRAVENOUS
  Filled 2024-07-13: qty 2

## 2024-07-13 MED ORDER — ZOLPIDEM TARTRATE 5 MG PO TABS: 5.0000 mg | ORAL_TABLET | Freq: Every evening | ORAL | Status: DC | PRN

## 2024-07-13 MED ORDER — OXYCODONE HCL 5 MG PO TABS: 5.0000 mg | ORAL_TABLET | Freq: Once | ORAL | Status: DC | PRN

## 2024-07-13 MED ORDER — OXYCODONE HCL 5 MG PO TABS
5.0000 mg | ORAL_TABLET | ORAL | Status: DC | PRN
  Administered 2024-07-13: 5 mg via ORAL
  Administered 2024-07-14 (×2): 10 mg via ORAL
  Filled 2024-07-13 (×3): qty 2
  Filled 2024-07-13: qty 1

## 2024-07-13 MED ORDER — ONDANSETRON HCL 4 MG/2ML IJ SOLN: 4.0000 mg | Freq: Four times a day (QID) | INTRAMUSCULAR | Status: DC | PRN

## 2024-07-13 MED ORDER — LACTATED RINGERS IV SOLN: INTRAVENOUS | Status: DC

## 2024-07-13 MED ORDER — PANTOPRAZOLE SODIUM 40 MG PO TBEC
40.0000 mg | DELAYED_RELEASE_TABLET | Freq: Every day | ORAL | Status: DC
  Administered 2024-07-13 – 2024-07-15 (×3): 40 mg via ORAL
  Filled 2024-07-13 (×3): qty 1

## 2024-07-13 MED ORDER — DIPHENHYDRAMINE HCL 12.5 MG/5ML PO ELIX: 12.5000 mg | ORAL_SOLUTION | ORAL | Status: DC | PRN

## 2024-07-13 MED ORDER — DOCUSATE SODIUM 100 MG PO CAPS
100.0000 mg | ORAL_CAPSULE | Freq: Two times a day (BID) | ORAL | Status: DC
  Administered 2024-07-13 – 2024-07-15 (×4): 100 mg via ORAL
  Filled 2024-07-13 (×4): qty 1

## 2024-07-13 MED ORDER — PHENOL 1.4 % MT LIQD: 1.0000 | OROMUCOSAL | Status: DC | PRN

## 2024-07-13 MED ORDER — ORAL CARE MOUTH RINSE: 15.0000 mL | Freq: Once | OROMUCOSAL | Status: AC

## 2024-07-13 MED ORDER — SODIUM CHLORIDE 0.9 % IV SOLN
2.0000 g | INTRAVENOUS | Status: DC
  Administered 2024-07-13 – 2024-07-15 (×2): 2 g via INTRAVENOUS
  Filled 2024-07-13 (×2): qty 20

## 2024-07-13 MED ORDER — TRANEXAMIC ACID-NACL 1000-0.7 MG/100ML-% IV SOLN
1000.0000 mg | INTRAVENOUS | Status: AC
  Administered 2024-07-13: 1000 mg via INTRAVENOUS
  Filled 2024-07-13: qty 100

## 2024-07-13 MED ORDER — VANCOMYCIN HCL 1250 MG/250ML IV SOLN
1250.0000 mg | Freq: Two times a day (BID) | INTRAVENOUS | Status: DC
  Administered 2024-07-14 – 2024-07-15 (×3): 1250 mg via INTRAVENOUS
  Filled 2024-07-13 (×4): qty 250

## 2024-07-13 MED ORDER — VANCOMYCIN HCL 1 G IV SOLR
INTRAVENOUS | Status: DC | PRN
  Administered 2024-07-13: 1000 mg via TOPICAL

## 2024-07-13 MED ORDER — SODIUM CHLORIDE 0.9 % IR SOLN
Status: DC | PRN
  Administered 2024-07-13: 1000 mL

## 2024-07-13 MED ORDER — ONDANSETRON HCL 4 MG PO TABS: 4.0000 mg | ORAL_TABLET | Freq: Four times a day (QID) | ORAL | Status: DC | PRN

## 2024-07-13 MED ORDER — ACETAMINOPHEN 500 MG PO TABS
1000.0000 mg | ORAL_TABLET | Freq: Once | ORAL | Status: DC
  Filled 2024-07-13: qty 2

## 2024-07-13 MED ORDER — BUPIVACAINE LIPOSOME 1.3 % IJ SUSP
INTRAMUSCULAR | Status: DC | PRN
Start: 2024-07-13 — End: 2024-07-13
  Administered 2024-07-13: 10 mL via PERINEURAL

## 2024-07-13 MED ORDER — VANCOMYCIN HCL IN DEXTROSE 1-5 GM/200ML-% IV SOLN
1000.0000 mg | Freq: Once | INTRAVENOUS | Status: AC
  Administered 2024-07-13: 1000 mg via INTRAVENOUS
  Filled 2024-07-13: qty 200

## 2024-07-13 MED ORDER — AMLODIPINE BESYLATE 5 MG PO TABS
5.0000 mg | ORAL_TABLET | Freq: Every day | ORAL | Status: DC
  Administered 2024-07-13 – 2024-07-14 (×2): 5 mg via ORAL
  Filled 2024-07-13 (×3): qty 1

## 2024-07-13 MED ORDER — ONDANSETRON HCL 4 MG/2ML IJ SOLN: 4.0000 mg | Freq: Once | INTRAMUSCULAR | Status: DC | PRN

## 2024-07-13 MED ORDER — BUPIVACAINE HCL (PF) 0.5 % IJ SOLN
INTRAMUSCULAR | Status: DC | PRN
  Administered 2024-07-13: 20 mL via PERINEURAL

## 2024-07-13 MED ORDER — OXYCODONE HCL 5 MG/5ML PO SOLN: 5.0000 mg | Freq: Once | ORAL | Status: DC | PRN

## 2024-07-13 MED ORDER — PROPOFOL 10 MG/ML IV BOLUS
INTRAVENOUS | Status: DC | PRN
  Administered 2024-07-13: 150 mg via INTRAVENOUS

## 2024-07-13 MED ORDER — HYDROMORPHONE HCL 1 MG/ML IJ SOLN
0.5000 mg | INTRAMUSCULAR | Status: DC | PRN
  Administered 2024-07-13 – 2024-07-14 (×2): 1 mg via INTRAVENOUS
  Filled 2024-07-13 (×2): qty 1

## 2024-07-13 MED ORDER — ROCURONIUM BROMIDE 10 MG/ML (PF) SYRINGE
PREFILLED_SYRINGE | INTRAVENOUS | Status: AC
  Filled 2024-07-13: qty 20

## 2024-07-13 MED ORDER — METHOCARBAMOL 500 MG PO TABS
500.0000 mg | ORAL_TABLET | Freq: Four times a day (QID) | ORAL | Status: DC | PRN
  Administered 2024-07-14 – 2024-07-15 (×2): 500 mg via ORAL
  Filled 2024-07-13 (×2): qty 1

## 2024-07-13 MED ORDER — CEFAZOLIN SODIUM-DEXTROSE 2-4 GM/100ML-% IV SOLN
2.0000 g | INTRAVENOUS | Status: AC
  Administered 2024-07-13: 2 g via INTRAVENOUS
  Filled 2024-07-13: qty 100

## 2024-07-13 MED ORDER — ENOXAPARIN SODIUM 40 MG/0.4ML IJ SOSY
40.0000 mg | PREFILLED_SYRINGE | INTRAMUSCULAR | Status: DC
  Administered 2024-07-14 – 2024-07-15 (×2): 40 mg via SUBCUTANEOUS
  Filled 2024-07-13 (×2): qty 0.4

## 2024-07-13 MED ORDER — LIDOCAINE HCL (CARDIAC) PF 100 MG/5ML IV SOSY
PREFILLED_SYRINGE | INTRAVENOUS | Status: DC | PRN
  Administered 2024-07-13: 80 mg via INTRAVENOUS

## 2024-07-13 MED ORDER — ORAL CARE MOUTH RINSE: 15.0000 mL | OROMUCOSAL | Status: DC | PRN

## 2024-07-13 MED ORDER — INSULIN ASPART 100 UNIT/ML IJ SOLN
0.0000 [IU] | Freq: Three times a day (TID) | INTRAMUSCULAR | Status: DC
  Administered 2024-07-14: 2 [IU] via SUBCUTANEOUS
  Administered 2024-07-14: 3 [IU] via SUBCUTANEOUS
  Administered 2024-07-14: 2 [IU] via SUBCUTANEOUS
  Filled 2024-07-13: qty 3
  Filled 2024-07-13 (×2): qty 2

## 2024-07-13 MED ORDER — ACETAMINOPHEN 500 MG PO TABS
1000.0000 mg | ORAL_TABLET | Freq: Four times a day (QID) | ORAL | Status: AC
  Administered 2024-07-13 – 2024-07-14 (×3): 1000 mg via ORAL
  Filled 2024-07-13 (×4): qty 2

## 2024-07-13 MED ORDER — NAPROXEN 250 MG PO TABS
250.0000 mg | ORAL_TABLET | Freq: Two times a day (BID) | ORAL | Status: DC
  Administered 2024-07-14 – 2024-07-15 (×3): 250 mg via ORAL
  Filled 2024-07-13 (×3): qty 1

## 2024-07-13 MED ORDER — LACTATED RINGERS IV SOLN: INTRAVENOUS | Status: DC | PRN

## 2024-07-13 MED ORDER — ACETAMINOPHEN 325 MG PO TABS
325.0000 mg | ORAL_TABLET | Freq: Once | ORAL | Status: AC
  Administered 2024-07-13: 325 mg via ORAL
  Filled 2024-07-13: qty 1

## 2024-07-13 MED ORDER — MENTHOL 3 MG MT LOZG: 1.0000 | LOZENGE | OROMUCOSAL | Status: DC | PRN

## 2024-07-13 MED ORDER — PROPOFOL 10 MG/ML IV BOLUS
INTRAVENOUS | Status: AC
Start: 2024-07-13 — End: 2024-07-13
  Filled 2024-07-13: qty 20

## 2024-07-13 MED ORDER — VANCOMYCIN HCL 1000 MG IV SOLR
INTRAVENOUS | Status: AC
  Filled 2024-07-13: qty 20

## 2024-07-13 MED ORDER — POLYETHYLENE GLYCOL 3350 17 G PO PACK: 17.0000 g | PACK | Freq: Every day | ORAL | Status: DC | PRN

## 2024-07-13 MED ORDER — MIDAZOLAM HCL (PF) 2 MG/2ML IJ SOLN
0.5000 mg | INTRAMUSCULAR | Status: AC
  Administered 2024-07-13: 1 mg via INTRAVENOUS
  Filled 2024-07-13: qty 2

## 2024-07-13 MED ORDER — VANCOMYCIN HCL IN DEXTROSE 1-5 GM/200ML-% IV SOLN: 1000.0000 mg | Freq: Two times a day (BID) | INTRAVENOUS | Status: DC

## 2024-07-13 MED ORDER — ROPINIROLE HCL 1 MG PO TABS
1.5000 mg | ORAL_TABLET | Freq: Three times a day (TID) | ORAL | Status: DC
  Administered 2024-07-13 – 2024-07-15 (×5): 1.5 mg via ORAL
  Filled 2024-07-13 (×5): qty 1

## 2024-07-13 MED ORDER — METOCLOPRAMIDE HCL 5 MG/ML IJ SOLN: 5.0000 mg | Freq: Three times a day (TID) | INTRAMUSCULAR | Status: DC | PRN

## 2024-07-13 MED ORDER — METOCLOPRAMIDE HCL 5 MG PO TABS: 5.0000 mg | ORAL_TABLET | Freq: Three times a day (TID) | ORAL | Status: DC | PRN

## 2024-07-13 MED ORDER — HYDROMORPHONE HCL 1 MG/ML IJ SOLN: 0.2500 mg | INTRAMUSCULAR | Status: DC | PRN

## 2024-07-13 MED ORDER — DROPERIDOL 2.5 MG/ML IJ SOLN: 0.6250 mg | Freq: Once | INTRAMUSCULAR | Status: DC | PRN

## 2024-07-13 MED ORDER — OXYCODONE HCL 5 MG PO TABS
10.0000 mg | ORAL_TABLET | ORAL | Status: DC | PRN
  Administered 2024-07-14 – 2024-07-15 (×5): 10 mg via ORAL
  Filled 2024-07-13 (×4): qty 2

## 2024-07-13 MED ORDER — TAMSULOSIN HCL 0.4 MG PO CAPS
0.4000 mg | ORAL_CAPSULE | Freq: Every day | ORAL | Status: DC
  Administered 2024-07-13 – 2024-07-14 (×2): 0.4 mg via ORAL
  Filled 2024-07-13 (×2): qty 1

## 2024-07-13 MED ORDER — BISACODYL 10 MG RE SUPP: 10.0000 mg | Freq: Every day | RECTAL | Status: DC | PRN

## 2024-07-13 MED ORDER — ROCURONIUM BROMIDE 100 MG/10ML IV SOLN
INTRAVENOUS | Status: DC | PRN
  Administered 2024-07-13: 20 mg via INTRAVENOUS
  Administered 2024-07-13: 80 mg via INTRAVENOUS

## 2024-07-13 MED ORDER — ONDANSETRON HCL 4 MG/2ML IJ SOLN
INTRAMUSCULAR | Status: AC
  Filled 2024-07-13: qty 2

## 2024-07-13 MED ORDER — PHENYLEPHRINE HCL-NACL 20-0.9 MG/250ML-% IV SOLN
INTRAVENOUS | Status: DC | PRN
  Administered 2024-07-13: 35 ug/min via INTRAVENOUS

## 2024-07-13 MED ORDER — MAGNESIUM CITRATE PO SOLN: 1.0000 | Freq: Once | ORAL | Status: DC | PRN

## 2024-07-13 MED ORDER — METHOCARBAMOL 1000 MG/10ML IJ SOLN: 500.0000 mg | Freq: Four times a day (QID) | INTRAMUSCULAR | Status: DC | PRN

## 2024-07-13 MED ORDER — CHLORHEXIDINE GLUCONATE 0.12 % MT SOLN
15.0000 mL | Freq: Once | OROMUCOSAL | Status: AC
  Administered 2024-07-13: 15 mL via OROMUCOSAL

## 2024-07-13 MED ORDER — DEXAMETHASONE SODIUM PHOSPHATE 4 MG/ML IJ SOLN
INTRAMUSCULAR | Status: DC | PRN
  Administered 2024-07-13: 5 mg via INTRAVENOUS

## 2024-07-13 MED ORDER — SUGAMMADEX SODIUM 200 MG/2ML IV SOLN
INTRAVENOUS | Status: DC | PRN
  Administered 2024-07-13: 200 mg via INTRAVENOUS

## 2024-07-13 SURGICAL SUPPLY — 55 items
BAG COUNTER SPONGE SURGICOUNT (BAG) ×2 IMPLANT
BLADE SAW SGTL 73X25 THK (BLADE) ×2 IMPLANT
CAP LOCKING COCR (Cap) IMPLANT
CHLORAPREP W/TINT 26 (MISCELLANEOUS) ×4 IMPLANT
CLSR STERI-STRIP ANTIMIC 1/2X4 (GAUZE/BANDAGES/DRESSINGS) ×2 IMPLANT
COOLER ICEMAN CLASSIC (MISCELLANEOUS) ×2 IMPLANT
COVER BACK TABLE 60X90IN (DRAPES) ×2 IMPLANT
COVER SURGICAL LIGHT HANDLE (MISCELLANEOUS) ×2 IMPLANT
DRAPE 3/4 80X56 (DRAPES) ×2 IMPLANT
DRAPE C-ARM 42X120 X-RAY (DRAPES) IMPLANT
DRAPE INCISE IOBAN 66X45 STRL (DRAPES) ×2 IMPLANT
DRAPE POUCH INSTRU U-SHP 10X18 (DRAPES) ×2 IMPLANT
DRAPE SHEET LG 3/4 BI-LAMINATE (DRAPES) ×4 IMPLANT
DRAPE SURG ORHT 6 SPLT 77X108 (DRAPES) ×4 IMPLANT
DRAPE TOP 10253 STERILE (DRAPES) ×2 IMPLANT
DRSG AQUACEL AG ADV 3.5X 6 (GAUZE/BANDAGES/DRESSINGS) ×2 IMPLANT
ELECT BLADE TIP CTD 4 INCH (ELECTRODE) ×2 IMPLANT
ELECT PENCIL ROCKER SW 15FT (MISCELLANEOUS) ×2 IMPLANT
ELECT REM PT RETURN 15FT ADLT (MISCELLANEOUS) ×2 IMPLANT
FACESHIELD WRAPAROUND OR TEAM (MASK) ×6 IMPLANT
GLENOID STD CANN CC 42 (Shoulder) IMPLANT
GLOVE BIO SURGEON STRL SZ 6.5 (GLOVE) ×4 IMPLANT
GLOVE BIOGEL PI IND STRL 6.5 (GLOVE) ×2 IMPLANT
GLOVE BIOGEL PI IND STRL 8 (GLOVE) ×2 IMPLANT
GLOVE ECLIPSE 8.0 STRL XLNG CF (GLOVE) ×4 IMPLANT
GOWN STRL REUS W/ TWL LRG LVL3 (GOWN DISPOSABLE) ×4 IMPLANT
IMPL PROX BODY PTC 15X132 (Stem) IMPLANT
KIT BASIN OR (CUSTOM PROCEDURE TRAY) ×2 IMPLANT
KIT STABILIZATION SHOULDER (MISCELLANEOUS) ×2 IMPLANT
KIT TURNOVER KIT A (KITS) ×2 IMPLANT
LINER HUM AEQ 42 +6 5D (Liner) IMPLANT
MANIFOLD NEPTUNE II (INSTRUMENTS) ×2 IMPLANT
NS IRRIG 1000ML POUR BTL (IV SOLUTION) ×2 IMPLANT
PACK SHOULDER (CUSTOM PROCEDURE TRAY) ×2 IMPLANT
PAD COLD SHLDR WRAP-ON (PAD) ×2 IMPLANT
RESTRAINT HEAD UNIVERSAL NS (MISCELLANEOUS) ×2 IMPLANT
SAWBLADE TREPINE SURG 12 (Screw) IMPLANT
SCREW SHLD ASSEMBLY AEQ 20 (Screw) IMPLANT
SET HNDPC FAN SPRY TIP SCT (DISPOSABLE) ×2 IMPLANT
SLING ARM FOAM STRAP LRG (SOFTGOODS) IMPLANT
SLING ULTRA II L (ORTHOPEDIC SUPPLIES) IMPLANT
SPACER SHLD CMT AEQ 15X20 (Shoulder) IMPLANT
SPONGE T-LAP 4X18 ~~LOC~~+RFID (SPONGE) IMPLANT
STEM PTC DISTAL 15X90 (Stem) IMPLANT
SUCTION TUBE FRAZIER 12FR DISP (SUCTIONS) IMPLANT
SUT ETHIBOND 2 V 37 (SUTURE) ×2 IMPLANT
SUT ETHIBOND NAB CT1 #1 30IN (SUTURE) ×2 IMPLANT
SUT MNCRL AB 4-0 PS2 18 (SUTURE) ×2 IMPLANT
SUT VIC AB 0 CT1 36 (SUTURE) IMPLANT
SUT VIC AB 3-0 SH 27X BRD (SUTURE) ×2 IMPLANT
SUTURE FIBERWR #5 38 CONV NDL (SUTURE) ×4 IMPLANT
TOWEL OR DSP ST BLU DLX 10/PK (DISPOSABLE) ×2 IMPLANT
TRAY REV FLEX AEQUALIS 3.5X6 (Shoulder) IMPLANT
TUBE SUCTION HIGH CAP CLEAR NV (SUCTIONS) ×2 IMPLANT
WATER STERILE IRR 1000ML POUR (IV SOLUTION) ×4 IMPLANT

## 2024-07-13 NOTE — Anesthesia Procedure Notes (Signed)
 Procedure Name: Intubation Date/Time: 07/13/2024 2:54 PM  Performed by: Dartha Meckel, CRNAPre-anesthesia Checklist: Patient identified, Emergency Drugs available, Suction available and Patient being monitored Patient Re-evaluated:Patient Re-evaluated prior to induction Oxygen  Delivery Method: Circle system utilized Preoxygenation: Pre-oxygenation with 100% oxygen  Induction Type: IV induction Ventilation: Mask ventilation without difficulty Laryngoscope Size: Glidescope and 4 Grade View: Grade I Tube type: Oral Tube size: 7.5 mm Number of attempts: 1 Airway Equipment and Method: Stylet and Oral airway Placement Confirmation: ETT inserted through vocal cords under direct vision, positive ETCO2 and breath sounds checked- equal and bilateral Secured at: 22 cm Tube secured with: Tape Dental Injury: Teeth and Oropharynx as per pre-operative assessment

## 2024-07-13 NOTE — Anesthesia Postprocedure Evaluation (Signed)
 Anesthesia Post Note  Patient: Willie Howe  Procedure(s) Performed: REVISION, REVERSE TOTAL ARTHROPLASTY, SHOULDER (Left: Shoulder) BIOPSY, SYNOVIUM (Left)     Patient location during evaluation: PACU Anesthesia Type: General Level of consciousness: awake and alert and oriented Pain management: pain level controlled Vital Signs Assessment: post-procedure vital signs reviewed and stable Respiratory status: spontaneous breathing, nonlabored ventilation and respiratory function stable Cardiovascular status: blood pressure returned to baseline and stable Postop Assessment: no apparent nausea or vomiting Anesthetic complications: no   No notable events documented.  Last Vitals:  Vitals:   07/13/24 1431 07/13/24 1705  BP: 137/86 (!) 146/90  Pulse: 79 83  Resp: 19 20  Temp:    SpO2: 98% 100%    Last Pain:  Vitals:   07/13/24 1705  TempSrc:   PainSc: 0-No pain                 Nia Nathaniel A.

## 2024-07-13 NOTE — Transfer of Care (Signed)
 Immediate Anesthesia Transfer of Care Note  Patient: Willie Howe  Procedure(s) Performed: REVISION, REVERSE TOTAL ARTHROPLASTY, SHOULDER (Left: Shoulder) BIOPSY, SYNOVIUM (Left)  Patient Location: PACU  Anesthesia Type:General  Level of Consciousness: sedated, patient cooperative, and responds to stimulation  Airway & Oxygen  Therapy: Patient Spontanous Breathing and Patient connected to face mask oxygen   Post-op Assessment: Report given to RN and Post -op Vital signs reviewed and stable  Post vital signs: Reviewed and stable  Last Vitals:  Vitals Value Taken Time  BP 146/90 07/13/24 17:05  Temp    Pulse 82 07/13/24 17:06  Resp 20 07/13/24 17:06  SpO2 100 % 07/13/24 17:06  Vitals shown include unfiled device data.  Last Pain:  Vitals:   07/13/24 1246  TempSrc:   PainSc: 8          Complications: No notable events documented.

## 2024-07-13 NOTE — Discharge Instructions (Addendum)
 Bonner Hair MD, MPH Aleck Stalling, PA-C [facility] [address], [address] [phone] (tel)   [phone] (fax)   POST-OPERATIVE INSTRUCTIONS - TOTAL SHOULDER REPLACEMENT    WOUND CARE You may leave the operative dressing in place until your follow-up appointment. KEEP THE INCISIONS CLEAN AND DRY. There may be a small amount of fluid/bleeding leaking at the surgical site. This is normal after surgery.  If it fills with liquid or blood please call us  immediately to change it for you. Use the provided ice machine or Ice packs as often as possible for the first 3-4 days, then as needed for pain relief.   Keep a layer of cloth or a shirt between your skin and the cooling unit to prevent frost bite as it can get very cold.  SHOWERING: - You may shower on Post-Op Day #2.  - The dressing is water  resistant but do not scrub it as it may start to peel up.   - You may remove the sling for showering - Gently pat the area dry.  - Do not soak the shoulder in water .  - Do not go swimming in the pool or ocean until your incision has completely healed (about 4-6 weeks after surgery) - KEEP THE INCISIONS CLEAN AND DRY.  EXERCISES Wear the sling at all times  You may remove the sling for showering, but keep the arm across the chest or in a secondary sling.    Accidental/Purposeful External Rotation and shoulder flexion (reaching behind you) is to be avoided at all costs for the first month. It is ok to come out of your sling if your are sitting and have assistance for eating.   Do not lift anything heavier than 1 pound until we discuss it further in clinic.  It is normal for your fingers/hand to become more swollen after surgery and discolored from bruising.   This will resolve over the first few weeks usually after surgery. Please continue to ambulate and do not stay sitting or lying for too long.  Perform foot and wrist pumps to assist in circulation.  PHYSICAL  THERAPY - No therapy for 4 weeks  REGIONAL ANESTHESIA (NERVE BLOCKS) The anesthesia team may have performed a nerve block for you this is a great tool used to minimize pain.   The block may start wearing off overnight (between 8-24 hours postop) When the block wears off, your pain may go from nearly zero to the pain you would have had postop without the block. This is an abrupt transition but nothing dangerous is happening.   This can be a challenging period but utilize your as needed pain medications to try and manage this period. We suggest you use the pain medication the first night prior to going to bed, to ease this transition.  You may take an extra dose of narcotic when this happens if needed   POST-OP MEDICATIONS- Multimodal approach to pain control In general your pain will be controlled with a combination of substances.  Prescriptions unless otherwise discussed are electronically sent to your pharmacy.  This is a carefully made plan we use to minimize narcotic use.     Celebrex  - Anti-inflammatory medication taken on a scheduled basis Acetaminophen  - Non-narcotic pain medicine taken on a scheduled basis  Gabapentin  - this is for nerve based pain, take on a scheduled basis Oxycodone  - This is a strong narcotic, to be used only on an "as needed" basis for SEVERE pain. Aspirin  81mg  -  This medicine is used to minimize the risk of blood clots after surgery. Zofran  -  take as needed for nausea   FOLLOW-UP If you develop a Fever (>101.5), Redness or Drainage from the surgical incision site, please call our office to arrange for an evaluation. Please call the office to schedule a follow-up appointment for a wound check, 7-10 days post-operatively.  IF YOU HAVE ANY QUESTIONS, PLEASE FEEL FREE TO CALL OUR OFFICE.  HELPFUL INFORMATION  Your arm will be in a sling following surgery. You will be in this sling for the next 4 weeks.   You may be more comfortable sleeping in a semi-seated  position the first few nights following surgery.  Keep a pillow propped under the elbow and forearm for comfort.  If you have a recliner type of chair it might be beneficial.  If not that is fine too, but it would be helpful to sleep propped up with pillows behind your operated shoulder as well under your elbow and forearm.  This will reduce pulling on the suture lines.  When dressing, put your operative arm in the sleeve first.  When getting undressed, take your operative arm out last.  Loose fitting, button-down shirts are recommended.  In most states it is against the law to drive while your arm is in a sling. And certainly against the law to drive while taking narcotics.  You may return to work/school in the next couple of days when you feel up to it. Desk work and typing in the sling is fine.  We suggest you use the pain medication the first night prior to going to bed, in order to ease any pain when the anesthesia wears off. You should avoid taking pain medications on an empty stomach as it will make you nauseous.  You should wean off your narcotic medicines as soon as you are able.     Most patients will be off narcotics before their first postop appointment.   Do not drink alcoholic beverages or take illicit drugs when taking pain medications.  Pain medication may make you constipated.  Below are a few solutions to try in this order: Decrease the amount of pain medication if you aren't having pain. Drink lots of decaffeinated fluids. Drink prune juice and/or each dried prunes  If the first 3 don't work start with additional solutions Take Colace - an over-the-counter stool softener Take Senokot - an over-the-counter laxative Take Miralax  - a stronger over-the-counter laxative   Dental Antibiotics:  We require dental prophylaxis for 2 years after a shoulder replacement  Contact your surgeon for an antibiotic prescription, prior to your dental procedure.   For more information  including helpful videos and documents visit our website:   [email or web address]

## 2024-07-13 NOTE — Op Note (Signed)
 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  Upon examination of the joint there was no obvious purulence however the patient had metallosis changes.  The proximal aspect of the stem and the spacer were clearly loose.  The proximal aspect of debonded from the ingrowth on the tuberosity.  There was some bone loss of the tuberosity.  Based on the fibrinous material we found in the loosening of the stem my top suspicion is infection in the setting of previous infection.  We remove the entirety of the stem to ensure that there was appropriate quality of the distal ingrowth portion of the stem.  The distal aspect was well-fixed however.  We used a trephine to remove it without issue.  The glenoid had bone loss initially and was well-fixed and in the setting of minimal bone I felt that retaining this implant was in the patient's best interest.  We debrided any unhealthy appearing synovium in the shoulder and performed essentially a single-stage arthroplasty.  Patient is at high risk of continued infection and if he did have another infection or failure I would have to consider removing all implants.  Post-operative plan: The patient will be NWB in sling.  The patient will be will be admitted to observation due to medical complexity, monitoring and pain management.  DVT prophylaxis Aspirin  81 mg twice daily for 6 weeks.  Pain control with PRN pain medication preferring oral medicines.  Follow up plan will be scheduled in approximately 7 days for incision check and XR.  Physical therapy to start 4 weeks.  Implants: Tornier revive  size 15 partially coated distal stem, 20 mm spacer, 15 proximal body, +6 tray with a 6 mm retentive polyethylene, 42 glenosphere with retained baseplate  Post-Op Diagnosis: Same Surgeons:Primary: Willie Bonner DASEN, MD Assistants:Willie McBane, PA-C Location: Willie Howe Anesthesia: General with Exparel  Interscalene Antibiotics: Ancef  2g preop, Vancomycin  1000mg  locally Tourniquet time: None Estimated Blood Loss: 150 Complications: None Specimens: 3 for culture hold for 2 weeks to rule out C acnes Implants: Implant Name Type Inv. Item Serial No. Manufacturer Lot No. LRB No. Used Action  GLENOID STD CANN CC 42 - [id number] Shoulder GLENOID STD CANN CC 42  [employer] [id number] Left 1 Implanted  tonier extraction trephine     [id number] Left 1 Implanted  IMPL PROX BODY PTC 15X132 - [id number] Stem IMPL PROX BODY PTC 15X132  [employer] [id number] Left 1 Implanted  TRAY REV FLEX AEQUALIS 3.5X6 - [id number] Shoulder TRAY REV FLEX AEQUALIS 3.5X6  [employer] [id number] Left 1 Implanted  LINER HUM AEQ 42 +6 5D - [id number] Liner LINER HUM AEQ 42 +6 5D  [employer] [id number] Left 1 Implanted  SPACER SHLD CMT AEQ 15X20 - [id number] Shoulder SPACER SHLD CMT AEQ 15X20  [employer] [id number] Left 1 Implanted  STEM PTC DISTAL 15X90 - [id number] Stem STEM PTC DISTAL 15X90  [employer] [id number] Left 1 Implanted  CAP [facility] - [id number] Cap CAP LOCKING COCR  [employer] [id number] Left 1 Implanted  SCREW SHLD ASSEMBLY AEQ 20 - [id number] Screw SCREW SHLD ASSEMBLY AEQ 20  [employer] [id number] Left 1 Implanted    Indications for Surgery:   Willie Howe  Willie Howe is a 63 y.o. male with previous infected shoulder revised with new pain.  Benefits and risks of operative and nonoperative management were discussed prior to surgery with patient/guardian(s) and informed consent form was completed.  Infection and need for further surgery were discussed as was prosthetic stability and cuff issues.  We  additionally specifically discussed risks of axillary nerve injury, infection, periprosthetic fracture, continued pain and longevity of implants prior to beginning procedure.      Procedure:   The patient was identified in the preoperative holding area where the surgical site was marked. Block placed by anesthesia with exparel .  The patient was taken to the OR where a procedural timeout was called and the above noted anesthesia was induced.  The patient was positioned beachchair on allen table with spider arm positioner.  Preoperative antibiotics were dosed.  The patient's left shoulder was prepped and draped in the usual sterile fashion.  A second preoperative timeout was called.       Began by using the previous deltopectoral approach.  Went through skin sharply achieving hemostasis as we progressed.  We identified a previously placed nonabsorbable sutures were able to find the deltopectoral interval.  I was able to bluntly dissect down and stay lateral to avoid neurovascular structures medially.  Eventually was able to palpate the humerus and bluntly dissected with a Cobb and Bovie to peel the structures from lateral to medial.  Stable to expose and externally rotate the humerus.  It was clear that the implant proximally was loose.  A tuning fork was used to remove the proximal tray.  The proximal body was spinning relative to the distal aspect of the stem.  We tightened this together and noted much significant metallosis and some fibrinous appearing tissue but no obvious purulence.  I was able to clear fibrinous tissue and perform synovectomy around the stem proximally.  At that point I initially tried to remove the stem as 1 unit however it was clear that the proximal aspect was easily removed and a trephine was needed to remove the distal aspect.  We used a 12 mm Treflan from [place] were able to remove the distal aspect of the stem without issue.  Thought process was that if the proximal body and  spacer had been moving that the reliability of the teeth that engage the distal stem to the spacer may have been compromised.  I did not want to use any compromised implants.  We placed trial implants as described above and turned her attention to the glenoid.  I was able to remove the glenosphere without issue.  There was significant metallosis and debris.  Synovectomy was performed around the joint protecting the axillary nerve.  There was essentially no rotator cuff or subscapularis but we did keep the anterior capsule for eventual closure.  Once this was complete I checked the tightness of all of our screws on the baseplate and curetted out any unhealthy appearing tissue.  We irrigated copiously and placed Prontosan solution by technique.  At that point I was able to place a final 42 glenosphere and turned our attention back to the humerus.  Trial reduction was performed and we placed a single FiberWire.  Final implants were put together on the back table by myself ensuring that they were completely secure.  They were placed without issue.  We reduced the joint and closed the anterior fascial layer after irrigating with 3 L normal saline.  Local vancomycin  powder was placed and the incision  was closed in a multilayer fashion fashion with nonabsorbable suture.  Sterile dressing and sling were placed.  Patient was awoken and taken to PACU in stable condition.   Aleck Stalling, PA-C, present and scrubbed throughout the case, critical for completion in a timely fashion, and for retraction, instrumentation, closure.

## 2024-07-13 NOTE — Interval H&P Note (Signed)
 All questions answered, patient wants to proceed with procedure. ? ?

## 2024-07-13 NOTE — Progress Notes (Signed)
 Pharmacy Antibiotic Note  Willie Howe is a 63 y.o. male admitted on 07/13/2024 for total shoulder arthoplasty surgery with suspected prosthesis infection.  Pharmacy has been consulted for vancomycin  dosing. Ancef  2g IV x1  given preop and Vancomycin  1000g powder given intra intraop.  -last Scr 0.85 (11/20), afebrile  Plan: Vancomycin  1000mg  IV x1 to complete LD, followed by 1250mg  IV q12h (eAUC 477.1, Scr 0.85, Vd 0.5) Monitor clinical status, renal function Follow up LOT, and path tissue cultures   Height: 6' 3.5 (191.8 cm) Weight: 116 kg (255 lb 12.8 oz) IBW/kg (Calculated) : 85.65  Temp (24hrs), Avg:97.7 F (36.5 C), Min:97.7 F (36.5 C), Max:97.7 F (36.5 C)  Recent Labs  Lab 07/07/24 0825  WBC 6.3  CREATININE 0.85    Estimated Creatinine Clearance: 119.9 mL/min (by C-G formula based on SCr of 0.85 mg/dL).    Allergies  Allergen Reactions   Lisinopril Cough   Statins Other (See Comments)    Muscle pain    Antimicrobials this admission: CRO 11/26 >> Vanc 11/26 >>   Dose adjustments this admission: None  Microbiology results: 11/26 path tissue cx:   Thank you for allowing pharmacy to be a part of this patient's care.  Jahlisa Rossitto 07/13/2024 5:44 PM

## 2024-07-13 NOTE — Anesthesia Procedure Notes (Signed)
 Anesthesia Regional Block: Interscalene brachial plexus block   Pre-Anesthetic Checklist: , timeout performed,  Correct Patient, Correct Site, Correct Laterality,  Correct Procedure, Correct Position, site marked,  Risks and benefits discussed,  Surgical consent,  Pre-op evaluation,  At surgeon's request and post-op pain management  Laterality: Left  Prep: chloraprep       Needles:  Injection technique: Single-shot  Needle Type: Echogenic Stimulator Needle     Needle Length: 10cm  Needle Gauge: 21   Needle insertion depth: 7 cm   Additional Needles:   Procedures:,,,, ultrasound used (permanent image in chart),,    Narrative:  Start time: 07/13/2024 2:22 PM End time: 07/13/2024 2:27 PM Injection made incrementally with aspirations every 5 mL.  Performed by: Personally  Anesthesiologist: Jerrye Sharper, MD  Additional Notes: Timeout performed. Patient sedated. Relevant anatomy ID'd using US . Incremental 2-5ml injection of LA with frequent aspiration. Patient tolerated procedure well.

## 2024-07-14 DIAGNOSIS — T8450XA Infection and inflammatory reaction due to unspecified internal joint prosthesis, initial encounter: Secondary | ICD-10-CM | POA: Diagnosis not present

## 2024-07-14 DIAGNOSIS — Z96612 Presence of left artificial shoulder joint: Secondary | ICD-10-CM

## 2024-07-14 LAB — GLUCOSE, CAPILLARY
Glucose-Capillary: 139 mg/dL — ABNORMAL HIGH (ref 70–99)
Glucose-Capillary: 143 mg/dL — ABNORMAL HIGH (ref 70–99)
Glucose-Capillary: 169 mg/dL — ABNORMAL HIGH (ref 70–99)
Glucose-Capillary: 139 mg/dL — ABNORMAL HIGH (ref 70–99)

## 2024-07-14 MED ORDER — CEFTRIAXONE IV (FOR PTA / DISCHARGE USE ONLY): 2.0000 g | INTRAVENOUS | 2 refills | Status: DC

## 2024-07-14 NOTE — Plan of Care (Signed)
  Problem: Coping: Goal: Level of anxiety will decrease Outcome: Progressing   Problem: Pain Managment: Goal: General experience of comfort will improve and/or be controlled Outcome: Progressing   Problem: Skin Integrity: Goal: Risk for impaired skin integrity will decrease Outcome: Progressing   Problem: Activity: Goal: Ability to tolerate increased activity will improve Outcome: Progressing   Problem: Pain Management: Goal: Pain level will decrease with appropriate interventions Outcome: Progressing

## 2024-07-14 NOTE — Evaluation (Signed)
 Occupational Therapy Evaluation/Discharge Patient Details Name: Willie Howe MRN: [id number] DOB: 1960/12/01 Today's Date: 07/14/2024   History of Present Illness   Pt is a 63 y/o male presenting for revision of L reverse total shoulder arthroplasty with biopsy due to suspected infection. Pending PICC line placement. PMH: BPPV, hx of DVT, Graves Disease, HTN, prostate CA, RA, DM2     Clinical Impressions PTA, pt live with spouse, typically Modified Independent with ADLs and mobility with intermittent cane use. Pt presents now with nerve block still in effect, only able to wiggle digits of L hand. Educated re: shoulder precautions for dressing/bathing, sling mgmt, allowed hand/wrist/elbow exercises and Iceman use. Pt and wife familiar with all precautions d/t prior shoulder surgeries. Encouraged consideration of obtaining shower chair to keep LUE supported safely during showering tasks. Pt functionally appropriate for DC home once medically cleared. Recommend follow up therapy pending surgeon's shoulder protocol.      If plan is discharge home, recommend the following:   A little help with bathing/dressing/bathroom;Assistance with cooking/housework     Functional Status Assessment   Patient has had a recent decline in their functional status and demonstrates the ability to make significant improvements in function in a reasonable and predictable amount of time.     Equipment Recommendations   None recommended by OT (pt to obtain shower chair upon DC)     Recommendations for Other Services         Precautions/Restrictions   Precautions Precautions: Shoulder Shoulder Interventions: Todd joy ultra sling;At all times;Off for dressing/bathing/exercises Precaution Booklet Issued: Yes (comment) Recall of Precautions/Restrictions: Intact Restrictions Weight Bearing Restrictions Per Provider Order: Yes LUE Weight Bearing Per Provider Order: Non weight bearing     Mobility  Bed Mobility               General bed mobility comments: in recliner on entry    Transfers                          Balance                                           ADL either performed or assessed with clinical judgement   ADL Overall ADL's : Needs assistance/impaired Eating/Feeding: Independent   Grooming: Set up;Sitting   Upper Body Bathing: Moderate assistance;Sitting   Lower Body Bathing: Contact guard assist;Sit to/from stand;Sitting/lateral leans   Upper Body Dressing : Moderate assistance;Sitting   Lower Body Dressing: Minimal assistance;Sitting/lateral leans;Sit to/from stand   Toilet Transfer: Radiographer, Therapeutic Details (indicate cue type and reason): observed walking from bathroom earlier this AM with use of IV pole. pt denies concerns           General ADL Comments: Eductaed re: shoulder precautions for ADLs, benefit of obtaining shower chair to optimize positioning/safety of UE with this task, sling mgmt, iceman use and pt reports familiarity with all of these strategies based on prior shoulder surgery. educated on allowed hand, wrist and elbow exercises though pt unable to actively perform d/t nerve block a this time.     Vision Baseline Vision/History: 1 Wears glasses Ability to See in Adequate Light: 0 Adequate Patient Visual Report: No change from baseline Vision Assessment?: No apparent visual deficits     Perception         Praxis  Pertinent Vitals/Pain Pain Assessment Pain Assessment: No/denies pain     Extremity/Trunk Assessment Upper Extremity Assessment Upper Extremity Assessment: Right hand dominant;LUE deficits/detail LUE Deficits / Details: LUE in don joy sling, nerve block in effect. able to wiggle digits but unable to make a fist. unable to move wrist or elbow LUE Coordination: decreased fine motor;decreased gross motor   Lower Extremity Assessment Lower Extremity  Assessment: Overall WFL for tasks assessed   Cervical / Trunk Assessment Cervical / Trunk Assessment: Normal   Communication Communication Communication: No apparent difficulties   Cognition Arousal: Alert Behavior During Therapy: WFL for tasks assessed/performed, Flat affect Cognition: No apparent impairments                               Following commands: Intact       Cueing  General Comments   Cueing Techniques: Verbal cues      Exercises     Shoulder Instructions      Home Living Family/patient expects to be discharged to:: Private residence Living Arrangements: Spouse/significant other Available Help at Discharge: Family Type of Home: House Home Access: Stairs to enter Secretary/administrator of Steps: 4-5   Home Layout: Other (Comment) (1.5 level, unfinished basement that is not used)     Bathroom Shower/Tub: Chief Strategy Officer: Handicapped height     Home Equipment: Other (comment);Cane - single point (may have shower chair)          Prior Functioning/Environment Prior Level of Function : Independent/Modified Independent             Mobility Comments: cane for mobility intermittently pending back pain ADLs Comments: Indep with ADLs, shares IADLs with wife    OT Problem List: Impaired UE functional use   OT Treatment/Interventions:        OT Goals(Current goals can be found in the care plan section)   Acute Rehab OT Goals Patient Stated Goal: home soon OT Goal Formulation: All assessment and education complete, DC therapy   OT Frequency:       Co-evaluation              AM-PAC OT 6 Clicks Daily Activity     Outcome Measure Help from another person eating meals?: None Help from another person taking care of personal grooming?: None Help from another person toileting, which includes using toliet, bedpan, or urinal?: A Little Help from another person bathing (including washing, rinsing, drying)?:  A Little Help from another person to put on and taking off regular upper body clothing?: A Lot Help from another person to put on and taking off regular lower body clothing?: A Little 6 Click Score: 19   End of Session Equipment Utilized During Treatment: Other (comment) (sling) Nurse Communication: Mobility status  Activity Tolerance: Patient tolerated treatment well Patient left: in chair;with call bell/phone within reach;with family/visitor present  OT Visit Diagnosis: Pain Pain - Right/Left: Left Pain - part of body: Shoulder                Time: 8883-8865 OT Time Calculation (min): 18 min Charges:  OT General Charges $OT Visit: 1 Visit OT Evaluation $OT Eval Low Complexity: 1 Low  Mliss NOVAK, OTR/L Acute Rehab Services Office: [phone]   Mliss Fish 07/14/2024, 12:04 PM

## 2024-07-14 NOTE — Progress Notes (Signed)
 Subjective: Patient reports pain as mild to moderate. Nerve block still working. Says his hand is numb. Tolerating diet.  Urinating.   No CP, SOB.  Has not mobilized OOB with PT/OT yet.   Objective:   VITALS:   Vitals:   07/13/24 1835 07/13/24 2032 07/14/24 0101 07/14/24 0600  BP: (!) 146/94 119/76 112/78 112/71  Pulse: 84 93 90 86  Resp: 14 16 16 17   Temp: 97.9 F (36.6 C) (!) 97.4 F (36.3 C) 98.6 F (37 C) 98 F (36.7 C)  TempSrc: Oral Oral Oral Oral  SpO2: 93% 97% 96% 95%  Weight:      Height:          Latest Ref Rng & Units 07/13/2024    7:22 PM 07/07/2024    8:25 AM 12/05/2022   10:57 AM  CBC  WBC 4.0 - 10.5 K/uL 11.6  6.3  8.4   Hemoglobin 13.0 - 17.0 g/dL 86.4  86.7  85.4   Hematocrit 39.0 - 52.0 % 41.8  41.1  43.9   Platelets 150 - 400 K/uL 233  234  275       Latest Ref Rng & Units 07/13/2024    7:22 PM 07/07/2024    8:25 AM 12/05/2022   10:57 AM  BMP  Glucose 70 - 99 mg/dL  888  886   BUN 8 - 23 mg/dL  13  8   Creatinine 9.38 - 1.24 mg/dL 9.18  9.14  9.23   Sodium 135 - 145 mmol/L  136  137   Potassium 3.5 - 5.1 mmol/L  4.1  3.6   Chloride 98 - 111 mmol/L  103  106   CO2 22 - 32 mmol/L  24  22   Calcium 8.9 - 10.3 mg/dL  9.5  9.0    Intake/Output      11/26 0701 11/27 0700 11/27 0701 11/28 0700   P.O. 120    I.V. (mL/kg) 1000 (8.6)    IV Piggyback 200    Total Intake(mL/kg) 1320 (11.4)    Urine (mL/kg/hr) 1700    Emesis/NG output 0    Stool 0    Blood 100    Total Output 1800    Net -480         Urine Occurrence 0 x    Stool Occurrence 0 x    Emesis Occurrence 0 x       Physical Exam: General: NAD.  Sitting up in bed, calm, comfortable Resp: No increased wob Cardio: regular rate and rhythm ABD soft Neurologically intact MSK Neurovascularly intact Sensation diminished distally d/t block Intact pulses distally Only see a flicker of movement from the fingers 0/5 grip Incision: dressing C/D/I Sling to LUE  Assessment: 1  Day Post-Op  S/P Procedure(s) (LRB): REVISION, REVERSE TOTAL ARTHROPLASTY, SHOULDER (Left) BIOPSY, SYNOVIUM (Left) by Dr. Cristy on 07/13/24  Principal Problem:   Status post reverse total replacement of left shoulder    Plan: Monitor intra-op cultures. NGTD Waiting on ID recommendations. Continue IV ABX as is for now  Advance diet Up with therapy Incentive Spirometry Elevate and Apply ice  Weightbearing: NWB LUE Insicional and dressing care: Reinforce dressings as needed Orthopedic device(s): sling Showering: Keep dressing dry VTE prophylaxis: Lovenox  40mg  qd while inpatient, will d/c on ASA 81mg  bid, SCDs, ambulation Pain control: PRN, anticipate increased need as block wears off Follow - up plan: 1 week Contact information for today:  Evalene Chancy MD, Gerard Large PA-C  Dispo:  TBD. Waiting on ID consult and likely PICC line placement. Once that is done, pain controlled, and passes PT/OT evaluation he can d/c home.      Gerard CHRISTELLA Large, PA-C Office [phone] 07/14/2024, 10:12 AM

## 2024-07-14 NOTE — TOC Transition Note (Addendum)
 Transition of Care [facility]) - Discharge Note   Patient Details  Name: Willie Howe MRN: [id number] Date of Birth: December 11, 1960  [facility] CM/SW Contact:  Alfonse JONELLE Rex, RN Phone Number: 07/14/2024, 1:31 PM   Clinical Narrative:   Admitted for left reverse total shoulder arthroplasty , OPPT, no home DME needs No INPT CM needs.     Final next level of care: Home/Self Care Barriers to Discharge: No Barriers Identified   Patient Goals and CMS Choice            Discharge Placement                       Discharge Plan and Services Additional resources added to the After Visit Summary for                                       Social Drivers of Health (SDOH) Interventions SDOH Screenings   Food Insecurity: No Food Insecurity (07/13/2024)  Housing: Low Risk  (07/13/2024)  Transportation Needs: No Transportation Needs (07/13/2024)  Utilities: Not At Risk (07/13/2024)  Depression (PHQ2-9): Low Risk  (10/25/2021)  Tobacco Use: Medium Risk (07/13/2024)     Readmission Risk Interventions    07/14/2024    1:29 PM  Readmission Risk Prevention Plan  Post Dischage Appt Complete  Medication Screening Complete  Transportation Screening Complete

## 2024-07-14 NOTE — Progress Notes (Signed)
 PHARMACY CONSULT NOTE FOR:  OUTPATIENT  PARENTERAL ANTIBIOTIC THERAPY (OPAT)  Indication: Prosthetic joint infection Regimen: Ceftriaxone  2 gm IV q24 End date: 08/24/2024  IV antibiotic discharge orders are pended. To discharging provider:  please sign these orders via discharge navigator,  Select New Orders & click on the button choice - Manage This Unsigned Work.     Thank you for allowing pharmacy to be a part of this patient's care.  Rosaline IVAR Edison, Pharm.D Use secure chat for questions 07/14/2024 1:58 PM

## 2024-07-14 NOTE — Plan of Care (Signed)
  Problem: Education: Goal: Knowledge of General Education information will improve Description: Including pain rating scale, medication(s)/side effects and non-pharmacologic comfort measures Outcome: Progressing   Problem: Clinical Measurements: Goal: Will remain free from infection Outcome: Progressing Goal: Respiratory complications will improve Outcome: Progressing Goal: Cardiovascular complication will be avoided Outcome: Progressing   Problem: Nutrition: Goal: Adequate nutrition will be maintained Outcome: Progressing   Problem: Coping: Goal: Level of anxiety will decrease Outcome: Progressing   Problem: Elimination: Goal: Will not experience complications related to urinary retention Outcome: Progressing   Problem: Safety: Goal: Ability to remain free from injury will improve Outcome: Progressing

## 2024-07-14 NOTE — Consult Note (Signed)
 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 S/p 6 week ceftriaxone /doxy. 10/25/21 id office visit trial off abx and patient lost to follow up 07/13/24 left reverse revision shoulder arthroplasty (glenosphere exchange) with vancomycin  powder placement. Findings -- no obvious purulence, however had metallosis changes; loose stem/spacer but fixed distal component; fibrinous materials found and suspicious for infection; cx in progress    Ortho does want to treat as infection. And given unlikely patient will tolerate another surgery?? I agree with plan to have long if not indefinite abx suppression   Plan: Advise patient for the next 4-6 weeks if one of his RA med can be hold (pred/methotrexate ) or decreased dose that would be best; he will discuss with his rheumatologist Continue empiric abx vanc/cetriaxone F/u cx and will call lab to keep culture for 2 weeks for p-acnes If nothing grew by 11/28 or 11/29 will dc home on doxy/ceftriaxone  Will place opat tomorrow once culture more seasoned Picc ordered (if not needed can remove tomorrow) I have notified our hh abx nurse Pam and she'll come tomorrow to educate patient Discussed with primary team       OPAT Orders Discharge antibiotics to be given via PICC line Discharge  antibiotics: Doxycycline  PO 100 mg bid Ceftriaxone  iv 2 gram daily   Duration: Starting 11/26 6 weeks with above combination, then transition to doxycycline /cefadroxil chronic suppression  End Date: 08/24/2024  [facility] Care Per Protocol:  Home health RN for IV administration and teaching; PICC line care and labs.    Labs weekly while on IV antibiotics: _x_ CBC with differential __ BMP _x_ CMP _x_ CRP _x_ ESR __ Vancomycin  trough __ CK  _x_ Please pull PIC at completion of IV antibiotics __ Please leave PIC in place until doctor has seen patient or been notified  Fax weekly labs to [phone]  Clinic Follow Up Appt: 08/22/2024 @ 330 with dr Overton  @  [facility] [address], [place], [place] [address] Phone: [phone]   ------------------------------------------------ Principal Problem:   Status post reverse total replacement of left shoulder    HPI: Willie Howe is a 63 y.o. 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 S/p 6 week ceftriaxone /doxy. 10/25/21 id office visit trial off abx and patient lost to follow up 07/13/24 left reverse revision shoulder arthroplasty (glenosphere exchange) with vancomycin  powder placement. Findings -- no obvious purulence, however had metallosis changes; loose stem/spacer but fixed distal component; fibrinous materials found and suspicious for infection; cx in progress    Patient said after he  was taken off abx in 2023 he continues to have pain. Chart reviewed showed it improved but he doesn't really recall how bad before/after abx  05/2024 ct showed  hardware loosening and dr Cristy had wanted to do revision again for concern of infection  No recent abx No fever chill  He has RA and is taking infliximab , prednisone , methotrexate . All risk for worse outcome with prosthetic joint   Family History  Problem Relation Age of Onset   Deep vein thrombosis Mother    Hypertension Mother    Breast cancer Mother    Hypertension Father    Prostate cancer Father    Colon cancer Father    Breast cancer Sister    CAD Sister    Stroke Maternal Aunt    Prostate cancer Paternal Uncle    Tremor Maternal Grandmother    Lung cancer Other    Glaucoma Neg Hx    Parkinson's disease Neg Hx     Social History   Tobacco Use   Smoking status: Former    Current packs/day: 0.00    Average packs/day: 1.5 packs/day for 20.0 years (30.0 ttl pk-yrs)    Types: Cigarettes    Start date: 02/03/1975    Quit date: 02/03/1995    Years since quitting: 29.4   Smokeless tobacco: Never  Vaping Use   Vaping status: Never Used  Substance Use Topics   Alcohol  use: No    Alcohol /week: 0.0 standard drinks of alcohol    Drug use: No    Comment: quit heroin and cocaine in 1994    Allergies  Allergen Reactions   Lisinopril Cough   Statins Other (See Comments)    Muscle pain    Review of Systems: ROS All Other ROS was negative, except mentioned above   Past Medical History:  Diagnosis Date   Arthritis    BPPV (benign paroxysmal positional vertigo)    Bradycardia    Cataract    DVT (deep venous thrombosis) (HCC)    Elevated prostate specific antigen (PSA)    Graves disease 1996   Graves disease    hx of   History of kidney stones    History of pulmonary embolism    History of substance use disorder    Hypertension    Infection of prosthetic joint    Kidney stones    Obesity    Parkinson disease (HCC)    Prostate cancer (HCC)    June 2025   Rheumatoid arthritis (HCC)    Managed by Dr. Jon Jacob (Rheumatology)   Sleep apnea     Intolerant of CPAP   Type 2 diabetes mellitus (HCC)        Scheduled Meds:  acetaminophen   1,000 mg Oral Q6H   amLODipine   5 mg Oral Daily   docusate sodium   100 mg Oral BID   enoxaparin  (LOVENOX ) injection  40 mg Subcutaneous Q24H   insulin  aspart  0-15 Units Subcutaneous TID WC   insulin  aspart  0-5 Units Subcutaneous QHS   naproxen   250 mg Oral BID WC   pantoprazole   40 mg Oral Daily   rOPINIRole   1.5 mg Oral TID   tamsulosin   0.4 mg Oral QHS   Continuous Infusions:  cefTRIAXone  (ROCEPHIN )  IV Stopped (07/14/24 0019)   vancomycin  1,250 mg (07/14/24 0833)   PRN Meds:.bisacodyl , diphenhydrAMINE , HYDROmorphone  (DILAUDID ) injection, magnesium  citrate, menthol  **OR** phenol, methocarbamol  **OR** methocarbamol  (ROBAXIN ) injection, metoCLOPramide  **OR** metoCLOPramide  (REGLAN ) injection, ondansetron  **OR** ondansetron  (ZOFRAN ) IV, mouth rinse, oxyCODONE , oxyCODONE , polyethylene  glycol, zolpidem    OBJECTIVE: Blood pressure 100/64, pulse 85, temperature 97.8 F (36.6 C), resp. rate 18, height 6' 3.5 (1.918 m), weight 116 kg, SpO2 94%.  Physical Exam  General/constitutional: no distress, pleasant HEENT: Normocephalic, PER, Conj Clear, EOMI, Oropharynx clear Neck supple CV: rrr no mrg Lungs: clear to auscultation, normal respiratory effort Abd: Soft, Nontender Ext: no edema Skin: No Rash Neuro: nonfocal MSK: left shoulder in sling   Lab Results Lab Results  Component Value Date   WBC 11.6 (H) 07/13/2024   HGB 13.5 07/13/2024   HCT 41.8 07/13/2024   MCV 93.9 07/13/2024   PLT 233 07/13/2024    Lab Results  Component Value Date   CREATININE 0.81 07/13/2024   BUN 13 07/07/2024   NA 136 07/07/2024   K 4.1 07/07/2024   CL 103 07/07/2024   CO2 24 07/07/2024    Lab Results  Component Value Date   ALT 17 12/05/2022   AST 16 12/05/2022   ALKPHOS 76 12/05/2022   BILITOT 0.8 12/05/2022      Microbiology: Recent Results (from the past 240 hours)  Surgical pcr  screen     Status: None   Collection Time: 07/07/24  7:53 AM   Specimen: Nasal Mucosa; Nasal Swab  Result Value Ref Range Status   MRSA, PCR NEGATIVE NEGATIVE Final   Staphylococcus aureus NEGATIVE NEGATIVE Final    Comment: (NOTE) The Xpert SA Assay (FDA approved for NASAL specimens in patients 34 years of age and older), is one component of a comprehensive surveillance program. It is not intended to diagnose infection nor to guide or monitor treatment. Performed at [facility], [address] [place], [place] [address]   Aerobic/Anaerobic Culture w Gram Stain (surgical/deep wound)     Status: None (Preliminary result)   Collection Time: 07/13/24  4:26 PM   Specimen: Path Tissue  Result Value Ref Range Status   Specimen Description   Final    TISSUE Performed at [facility], [address], [place], [place] [address]    Special Requests   Final    LEFT SHOULDER 1 Performed at [facility], [address] [place], [place] [address]    Gram Stain NO WBC SEEN NO ORGANISMS SEEN   Final   Culture   Final    NO GROWTH < 12 HOURS Performed at [facility], [address] [place], [place] [address]    Report Status PENDING  Incomplete  Aerobic/Anaerobic Culture w Gram Stain (surgical/deep wound)     Status: None (Preliminary result)   Collection Time: 07/13/24  4:28 PM   Specimen: Path Tissue  Result Value Ref Range Status   Specimen Description   Final    TISSUE Performed at [facility], [address], [place], [place] [address]    Special Requests   Final    LEFT SHOULDER 2 Performed at [facility], [address], [place], [place] [address]    Gram Stain NO WBC SEEN NO ORGANISMS SEEN   Final   Culture   Final    NO GROWTH < 12 HOURS Performed at [facility], [address], [place], [place] [address]    Report Status PENDING  Incomplete   Aerobic/Anaerobic Culture w Gram Stain (surgical/deep wound)     Status: None (Preliminary result)   Collection Time: 07/13/24  4:28 PM   Specimen: Path Tissue  Result Value Ref Range Status  Specimen Description   Final    TISSUE Performed at [facility], [address] [place], [place] [address]    Special Requests   Final    LEFT SHOULDER 3 Performed at [facility], [address], [place], [place] [address]    Gram Stain NO WBC SEEN NO ORGANISMS SEEN   Final   Culture   Final    NO GROWTH < 12 HOURS Performed at [facility], [address] [place], [place] [address]    Report Status PENDING  Incomplete     Serology:    Imaging: If present, new imagings (plain films, ct scans, and mri) have been personally visualized and interpreted; radiology reports have been reviewed. Decision making incorporated into the Impression / Recommendations.  06/17/24 ct left shoulder 1. Findings consistent with loosening of the humeral prosthesis; no fracture identified. 2. Moderate acromioclavicular joint degenerative changes.  Constance ONEIDA Passer, MD [facility] [phone] pager    07/14/2024, 1:10 PM

## 2024-07-15 ENCOUNTER — Other Ambulatory Visit: Payer: Self-pay

## 2024-07-15 ENCOUNTER — Other Ambulatory Visit (HOSPITAL_COMMUNITY): Payer: Self-pay

## 2024-07-15 LAB — GLUCOSE, CAPILLARY
Glucose-Capillary: 107 mg/dL — ABNORMAL HIGH (ref 70–99)
Glucose-Capillary: 134 mg/dL — ABNORMAL HIGH (ref 70–99)

## 2024-07-15 MED ORDER — OXYCODONE HCL 5 MG PO TABS: ORAL_TABLET | ORAL | 0 refills | Status: AC

## 2024-07-15 MED ORDER — CHLORHEXIDINE GLUCONATE CLOTH 2 % EX PADS: 6.0000 | MEDICATED_PAD | Freq: Every day | CUTANEOUS | Status: DC

## 2024-07-15 MED ORDER — DOXYCYCLINE HYCLATE 100 MG PO CAPS
100.0000 mg | ORAL_CAPSULE | Freq: Two times a day (BID) | ORAL | 0 refills | Status: AC
  Filled 2024-07-15: qty 82, 41d supply, fill #0

## 2024-07-15 MED ORDER — ACETAMINOPHEN 500 MG PO TABS: 1000.0000 mg | ORAL_TABLET | Freq: Three times a day (TID) | ORAL | 0 refills | Status: AC

## 2024-07-15 MED ORDER — ASPIRIN 81 MG PO CHEW: 81.0000 mg | CHEWABLE_TABLET | Freq: Two times a day (BID) | ORAL | 0 refills | Status: DC

## 2024-07-15 MED ORDER — HEPARIN SOD (PORK) LOCK FLUSH 100 UNIT/ML IV SOLN
250.0000 [IU] | INTRAVENOUS | Status: AC | PRN
  Administered 2024-07-15: 250 [IU]
  Filled 2024-07-15: qty 3

## 2024-07-15 MED ORDER — SODIUM CHLORIDE 0.9% FLUSH: 10.0000 mL | INTRAVENOUS | Status: DC | PRN

## 2024-07-15 MED ORDER — GABAPENTIN 100 MG PO CAPS: 100.0000 mg | ORAL_CAPSULE | Freq: Three times a day (TID) | ORAL | 0 refills | Status: DC

## 2024-07-15 MED ORDER — ONDANSETRON HCL 4 MG PO TABS: 4.0000 mg | ORAL_TABLET | Freq: Three times a day (TID) | ORAL | 0 refills | Status: AC | PRN

## 2024-07-15 MED ORDER — CELECOXIB 100 MG PO CAPS: 100.0000 mg | ORAL_CAPSULE | Freq: Two times a day (BID) | ORAL | 0 refills | Status: DC

## 2024-07-15 MED ORDER — SODIUM CHLORIDE 0.9% FLUSH: 10.0000 mL | Freq: Two times a day (BID) | INTRAVENOUS | Status: DC

## 2024-07-15 NOTE — TOC Progression Note (Signed)
 Transition of Care [facility]) - Progression Note    Patient Details  Name: Willie Howe MRN: [id number] Date of Birth: Dec 29, 1960  Transition of Care [facility]) CM/SW Contact  Alfonse JONELLE Rex, RN Phone Number: 07/15/2024, 10:09 AM  Clinical Narrative:  Patient to receive home iv abx, referral sent to [facility] by ID physician, awaiting placement of PICC line. [facility], rep-Angela, accepted for [facility] RN, added to AVS      Barriers to Discharge: No Barriers Identified               Expected Discharge Plan and Services                                               Social Drivers of Health (SDOH) Interventions SDOH Screenings   Food Insecurity: No Food Insecurity (07/13/2024)  Housing: Low Risk  (07/13/2024)  Transportation Needs: No Transportation Needs (07/13/2024)  Utilities: Not At Risk (07/13/2024)  Depression (PHQ2-9): Low Risk  (10/25/2021)  Tobacco Use: Medium Risk (07/13/2024)    Readmission Risk Interventions    07/14/2024    1:29 PM  Readmission Risk Prevention Plan  Post Dischage Appt Complete  Medication Screening Complete  Transportation Screening Complete

## 2024-07-15 NOTE — Progress Notes (Signed)
 AVS reviewed with patient and wife who verbalized an understanding. PICC line in place - copy of PICC line care instructions added to AVS- copy printed out for patient. PIV removed as noted, patient dressed for discharge to home. Rocephin  will be delivered to home tonight, patient to administer a dose at 2200 tonight, will start doxycycline  in am of 11/29. Stockingnettte placed over PICC line, Patient to lobby via w/c - home with wife.

## 2024-07-15 NOTE — Plan of Care (Signed)
  Problem: Pain Managment: Goal: General experience of comfort will improve and/or be controlled Outcome: Progressing   Problem: Safety: Goal: Ability to remain free from injury will improve Outcome: Progressing   Problem: Coping: Goal: Ability to adjust to condition or change in health will improve Outcome: Progressing

## 2024-07-15 NOTE — Progress Notes (Deleted)
 AVS reviewed w/ patient and wife who verbalized an understanding. Patient dressed for discharge to home. PIV removed as noted. Patient to start IV antibiotic tonight - delivery today to home. No other questions at this time. Stockinghette placed over PICC line. PICC home care added to AVS. Patient home with wife

## 2024-07-15 NOTE — Progress Notes (Signed)
 Peripherally Inserted Central Catheter Placement  The IV Nurse has discussed with the patient and/or persons authorized to consent for the patient, the purpose of this procedure and the potential benefits and risks involved with this procedure.  The benefits include less needle sticks, lab draws from the catheter, and the patient may be discharged home with the catheter. Risks include, but not limited to, infection, bleeding, blood clot (thrombus formation), and puncture of an artery; nerve damage and irregular heartbeat and possibility to perform a PICC exchange if needed/ordered by physician.  Alternatives to this procedure were also discussed.  Bard Power PICC patient education guide, fact sheet on infection prevention and patient information card has been provided to patient /or left at bedside.    PICC Placement Documentation  PICC Single Lumen 07/15/24 Right Brachial 42 cm 0 cm (Active)  Indication for Insertion or Continuance of Line Home intravenous therapies (PICC only) 07/15/24 1300  Exposed Catheter (cm) 0 cm 07/15/24 1300  Site Assessment Clean, Dry, Intact 07/15/24 1300  Line Status Flushed;Blood return noted;Heparin  locked 07/15/24 1300  Dressing Type Transparent;Securing device 07/15/24 1300  Dressing Status Antimicrobial disc/dressing in place;Clean, Dry, Intact 07/15/24 1300  Line Care Connections checked and tightened 07/15/24 1300  Line Adjustment (NICU/IV Team Only) No 07/15/24 1300  Dressing Intervention New dressing 07/15/24 1300  Dressing Change Due 07/22/24 07/15/24 1300       Ethyl Priestly Renee 07/15/2024, 1:08 PM

## 2024-07-15 NOTE — Plan of Care (Signed)
 Id brief update   Operative cx ngtd at 2 days   -plan as 11/27 note -doxy PO and iv ceftriaxone  both 6 weeks till 08/24/24 -id f/u -discussed with team

## 2024-07-16 NOTE — Discharge Summary (Signed)
 Patient ID: Willie Howe MRN: [id number] DOB/AGE: 65-Mar-1962 65 y.o.  Admit date: 07/13/2024 Discharge date: 07/15/24  Admission Diagnoses:Failure of left reverse total shoulder arthroplasty prosthesis with suspected infection, painful hardware, synovitis and metallosis   Discharge Diagnoses:  Principal Problem:   Status post reverse total replacement of left shoulder   Past Medical History:  Diagnosis Date   Arthritis    BPPV (benign paroxysmal positional vertigo)    Bradycardia    Cataract    DVT (deep venous thrombosis) (HCC)    Elevated prostate specific antigen (PSA)    Graves disease 1996   Graves disease    hx of   History of kidney stones    History of pulmonary embolism    History of substance use disorder    Hypertension    Infection of prosthetic joint    Kidney stones    Obesity    Parkinson disease (HCC)    Prostate cancer (HCC)    June 2025   Rheumatoid arthritis (HCC)    Managed by Dr. Jon Jacob (Rheumatology)   Sleep apnea    Intolerant of CPAP   Type 2 diabetes mellitus (HCC)      Procedures Performed: Left revision to reverse total shoulder arthroplasty  Discharged Condition: stable  Hospital Course: Patient brought in for scheduled procedure.  Tolerated procedure well.  Was kept for monitoring overnight for pain control, medical monitoring postop, ID consultation, and discharge planning. ID consulted on the patient. PICC line was placed. Patient was found to be stable for DC home on 07/15/24 after surgery.  Patient was instructed on specific activity restrictions and all questions were answered.  Opiates Today (MME): Today's  total administered Morphine  Milligram Equivalents: 0 Opiates Yesterday (MME): Yesterday's total administered Morphine  Milligram Equivalents: 30 Opiates Used in the last two days:  Inpatient Morphine  Milligram Equivalents Per Day 11/26 - 11/28   Values displayed are in units of MME/Day    Order Start / End Date  11/26 11/27 Yesterday    oxyCODONE  (Oxy IR/ROXICODONE ) immediate release tablet 5 mg 11/26 - 11/26 0 of Unknown -- --    oxyCODONE  (ROXICODONE ) 5 MG/5ML solution 5 mg 11/26 - 11/26 0 of Unknown -- --      Group total: 0 of Unknown      fentaNYL  (SUBLIMAZE ) injection 25-100 mcg 11/26 - 11/26 15 of 7.5-30 -- --    HYDROmorphone  (DILAUDID ) injection 0.25-0.5 mg 11/26 - 11/26 0 of 40-80 -- --    HYDROmorphone  (DILAUDID ) injection 0.5-1 mg 11/26 - 11/28 20 of 20-40 20 of 60-120 0 of 50-100    oxyCODONE  (Oxy IR/ROXICODONE ) immediate release tablet 5-10 mg 11/26 - 11/28 7.5 of 15-30 30 of 45-90 0 of 37.5-75    oxyCODONE  (Oxy IR/ROXICODONE ) immediate release tablet 10-15 mg 11/26 - 11/28 0 of 30-45 45 of 90-135 30 of 75-112.5    Daily Totals  42.5 of Unknown (at least 112.5-225) 95 of 195-345 30 of 162.5-287.5    Calculation Errors     Order Type Date Details   oxyCODONE  (Oxy IR/ROXICODONE ) immediate release tablet 5 mg Ordered Dose -- Insufficient frequency information   oxyCODONE  (ROXICODONE ) 5 MG/5ML solution 5 mg Ordered Dose -- Insufficient frequency information            Consults: ID  Significant Diagnostic Studies: Cultures  Treatments: Surgery  Disposition: Discharge disposition: 01-Home or Self Care       Discharge Instructions     [facility] pharmacist to  adjust dose for Vancomycin , Aminoglycosides and other anti-infective therapies as requested by physician.   Complete by: As directed    [facility] to provide Cath Flo 2mg    Complete by: As directed    Administer for PICC line occlusion and as ordered by physician for other access device issues.   Anaphylaxis Kit: Provided to treat any anaphylactic reaction to the medication being provided to the patient if First Dose or when requested by physician   Complete by: As directed    Epinephrine  1mg /ml vial / amp: Administer 0.3mg  (0.3ml) subcutaneously once for moderate to severe anaphylaxis, nurse  to call physician and pharmacy when reaction occurs and call 911 if needed for immediate care   Diphenhydramine  50mg /ml IV vial: Administer 25-50mg  IV/IM PRN for first dose reaction, rash, itching, mild reaction, nurse to call physician and pharmacy when reaction occurs   Sodium Chloride  0.9% NS 500ml IV: Administer if needed for hypovolemic blood pressure drop or as ordered by physician after call to physician with anaphylactic reaction   Call MD for:  redness, tenderness, or signs of infection (pain, swelling, redness, odor or green/yellow discharge around incision site)   Complete by: As directed    Call MD for:  severe uncontrolled pain   Complete by: As directed    Call MD for:  temperature >100.4   Complete by: As directed    Change dressing on IV access line weekly and PRN   Complete by: As directed    Diet - low sodium heart healthy   Complete by: As directed    Flush IV access with Sodium Chloride  0.9% and Heparin  10 units/ml or 100 units/ml   Complete by: As directed    Home infusion instructions - [facility]   Complete by: As directed    Instructions: Flush IV access with Sodium Chloride  0.9% and Heparin  10units/ml or 100units/ml   Change dressing on IV access line: Weekly and PRN   Instructions Cath Flo 2mg : Administer for PICC Line occlusion and as ordered by physician for other access device   [facility] pharmacist to adjust dose for: Vancomycin , Aminoglycosides and other anti-infective therapies as requested by physician   Method of administration may be changed at the discretion of home infusion pharmacist based upon assessment of the patient and/or caregiver's ability to self-administer the medication ordered   Complete by: As directed    Outpatient Parenteral Antibiotic Therapy Information Antibiotic: Ceftriaxone  (Rocephin ) IVPB; Indications for use: prosthetic joint infection; End Date: 08/24/2024   Complete by: As directed    Antibiotic: Ceftriaxone   (Rocephin ) IVPB   Indications for use: prosthetic joint infection   End Date: 08/24/2024      Allergies as of 07/15/2024       Reactions   Lisinopril Cough   Statins Other (See Comments)   Muscle pain        Medication List     PAUSE taking these medications    methotrexate  (PF) 50 MG/2ML injection Wait to take this until your doctor or other care provider tells you to start again. Inject 0.8 mLs into the muscle once a week.   predniSONE  5 MG tablet Wait to take this until your doctor or other care provider tells you to start again. Commonly known as: DELTASONE  Take 5 mg by mouth daily.   RENFLEXIS  IV Wait to take this until your doctor or other care provider tells you to start again. Inject 1 Dose into the vein every 6 (six) weeks.  STOP taking these medications    acetaminophen  650 MG CR tablet Commonly known as: TYLENOL  Replaced by: acetaminophen  500 MG tablet   aspirin  EC 81 MG tablet Replaced by: aspirin  81 MG chewable tablet   HYDROcodone -acetaminophen  7.5-325 MG tablet Commonly known as: NORCO   valACYclovir  1000 MG tablet Commonly known as: VALTREX        TAKE these medications    acetaminophen  500 MG tablet Commonly known as: TYLENOL  Take 2 tablets (1,000 mg total) by mouth every 8 (eight) hours for 14 days. Replaces: acetaminophen  650 MG CR tablet Notes to patient: 7 am, 3 pm, 11 pm   amLODipine  10 MG tablet Commonly known as: NORVASC  Take 5 mg by mouth daily. What changed: Another medication with the same name was removed. Continue taking this medication, and follow the directions you see here.   aspirin  81 MG chewable tablet Commonly known as: Aspirin  Childrens Chew 1 tablet (81 mg total) by mouth 2 (two) times daily. For 6 weeks for DVT prophylaxis after surgery Replaces: aspirin  EC 81 MG tablet   cefTRIAXone  IVPB Commonly known as: ROCEPHIN  Inject 2 g into the vein daily. Indication:  prosthetic joint infection First Dose:  No Last Day of Therapy:  08/24/2024 Labs - Once weekly:  CBC/D and BMP, Labs - Once weekly: ESR and CRP Method of administration: IV Push Method of administration may be changed at the discretion of home infusion pharmacist based upon assessment of the patient and/or caregiver's ability to self-administer the medication ordered. Notes to patient: 10 pm   celecoxib  100 MG capsule Commonly known as: CeleBREX  Take 1 capsule (100 mg total) by mouth 2 (two) times daily. For 2 weeks. Then take as needed   docusate sodium  100 MG capsule Commonly known as: COLACE Take 200 mg by mouth every other day.   doxycycline  100 MG capsule Commonly known as: VIBRAMYCIN  Take 1 capsule (100 mg total) by mouth 2 (two) times daily. Notes to patient: Take with food   folic acid  1 MG tablet Commonly known as: FOLVITE  Take 1 mg by mouth daily.   gabapentin  100 MG capsule Commonly known as: NEURONTIN  Take 1 capsule (100 mg total) by mouth 3 (three) times daily. For pain   hydrOXYzine  25 MG tablet Commonly known as: ATARAX  Take 1 tablet (25 mg total) by mouth every 8 (eight) hours as needed for up to 20 doses for itching.   loratadine  10 MG tablet Commonly known as: CLARITIN  Take 10 mg by mouth daily.   metFORMIN  500 MG 24 hr tablet Commonly known as: GLUCOPHAGE -XR Take 500 mg by mouth 2 (two) times daily.   ondansetron  4 MG tablet Commonly known as: Zofran  Take 1 tablet (4 mg total) by mouth every 8 (eight) hours as needed for up to 7 days for nausea or vomiting.   oxyCODONE  5 MG immediate release tablet Commonly known as: Oxy IR/ROXICODONE  Take 1-2 pills every 6 hrs as needed for severe pain, no more than 6 per day What changed:  how much to take how to take this when to take this reasons to take this additional instructions   rOPINIRole  1 MG tablet Commonly known as: REQUIP  Take 1.5 tablets (1.5 mg total) by mouth 3 (three) times daily.   tamsulosin  0.4 MG Caps capsule Commonly  known as: FLOMAX  Take 0.4 mg by mouth at bedtime.               Discharge Care Instructions  (From admission, onward)  Start     Ordered   07/14/24 0000  Change dressing on IV access line weekly and PRN  (Home infusion instructions - [facility] )        07/14/24 1337            Follow-up Information     [facility]) Follow up.   Specialty: Home Health Services Why: Home Health Skilled Nursing (RN) for PICC line care Contact information: [phone] [facility] [place]  [address] [phone]                 Aleck Stalling, PA-C 07/16/24

## 2024-07-18 ENCOUNTER — Ambulatory Visit: Admitting: Physical Therapy

## 2024-07-18 LAB — AEROBIC/ANAEROBIC CULTURE W GRAM STAIN (SURGICAL/DEEP WOUND)
Culture: NO GROWTH
Culture: NO GROWTH
Culture: NO GROWTH
Gram Stain: NONE SEEN
Gram Stain: NONE SEEN
Gram Stain: NONE SEEN

## 2024-07-18 NOTE — Therapy (Deleted)
 OUTPATIENT PHYSICAL THERAPY SHOULDER EVALUATION   Patient Name: Willie Howe MRN: [id number] DOB:05-27-1961, 63 y.o., male Today's Date: 07/18/2024  END OF SESSION:   Past Medical History:  Diagnosis Date   Arthritis    BPPV (benign paroxysmal positional vertigo)    Bradycardia    Cataract    DVT (deep venous thrombosis) (HCC)    Elevated prostate specific antigen (PSA)    Graves disease 1996   Graves disease    hx of   History of kidney stones    History of pulmonary embolism    History of substance use disorder    Hypertension    Infection of prosthetic joint    Kidney stones    Obesity    Parkinson disease (HCC)    Prostate cancer (HCC)    June 2025   Rheumatoid arthritis (HCC)    Managed by Dr. Jon Jacob (Rheumatology)   Sleep apnea    Intolerant of CPAP   Type 2 diabetes mellitus (HCC)    Past Surgical History:  Procedure Laterality Date   BACK SURGERY  2015   COLONOSCOPY     EXAM UNDER ANESTHESIA WITH MANIPULATION OF KNEE Right 04/18/2015   Procedure: MANIPULATION OF RIGHT KNEE UNDER ANESTHESIA;  Surgeon: Taft FORBES Minerva, MD;  Location: [facility];  Service: Orthopedics;  Laterality: Right;   IVC Filter     removed December 2016   IVC FILTER REMOVAL  04/24/2015   KIDNEY STONE SURGERY  2010   KNEE ARTHROSCOPY Right 2006   meniscus    KNEE ARTHROSCOPY WITH MEDIAL MENISECTOMY Left 04/24/2016   Procedure: LEFT KNEE ARTHROSCOPY WITH PARTIAL MEDIAL MENISECTOMY;  Surgeon: Taft FORBES Minerva, MD;  Location: [facility];  Service: Orthopedics;  Laterality: Left;   KNEE JOINT MANIPULATION Right 04/18/2015   KNEE SURGERY     x 2   KNEE SURGERY Left 1976   ?ligament repair    LEFT HEART CATH AND CORONARY ANGIOGRAPHY N/A 11/29/2019   Procedure: LEFT HEART CATH AND CORONARY ANGIOGRAPHY;  Surgeon: Jordan, Peter M, MD;  Location: [facility] INVASIVE CV LAB;  Service: Cardiovascular;  Laterality: N/A;   LITHOTRIPSY     LUMBAR WOUND DEBRIDEMENT N/A 02/23/2014   Procedure:  LUMBAR WOUND DEBRIDEMENT;  Surgeon: Darina MALVA Boehringer, MD;  Location: [facility];  Service: Neurosurgery;  Laterality: N/A;  exploration lumbar wound. repair of dural defect.   MENISECTOMY Right    open medial    NEPHROLITHOTOMY     PERIPHERAL VASCULAR CATHETERIZATION N/A 01/30/2015   Procedure: IVC Filter Insertion;  Surgeon: Gaile LELON New, MD;  Location: [facility] INVASIVE CV LAB;  Service: Cardiovascular;  Laterality: N/A;   PERIPHERAL VASCULAR CATHETERIZATION N/A 04/24/2015   Procedure: IVC Filter Removal;  Surgeon: Gaile LELON New, MD;  Location: [facility] INVASIVE CV LAB;  Service: Cardiovascular;  Laterality: N/A;   PERIPHERAL VASCULAR CATHETERIZATION N/A 08/14/2015   Procedure: IVC Filter Removal;  Surgeon: Gaile LELON New, MD;  Location: [facility] INVASIVE CV LAB;  Service: Cardiovascular;  Laterality: N/A;   REVISION TOTAL SHOULDER TO REVERSE TOTAL SHOULDER Left 09/11/2021   Procedure: REVISION TOTAL SHOULDER TO REVERSE TOTAL SHOULDER;  Surgeon: Cristy Bonner DASEN, MD;  Location: [facility];  Service: Orthopedics;  Laterality: Left;   SHOULDER ARTHROSCOPY WITH OPEN ROTATOR CUFF REPAIR Left 11/10/2019   Procedure: SHOULDER ARTHROSCOPY WITH OPEN ROTATOR CUFF REPAIR;  Surgeon: Minerva Taft FORBES, MD;  Location: [facility];  Service: Orthopedics;  Laterality: Left;  pt tested + on 2/23, does not need Covid  test < 90 days   SHOULDER ARTHROSCOPY WITH ROTATOR CUFF REPAIR AND OPEN BICEPS TENODESIS Left 02/15/2019   Procedure: SHOULDER ARTHROSCOPY WITH open ROTATOR CUFF REPAIR AND OPEN BICEPS TENODESIS;  Surgeon: Margrette Taft BRAVO, MD;  Location: [facility];  Service: Orthopedics;  Laterality: Left;   SPINE SURGERY  2015   Dr Carles Fusion    SPINE SURGERY  1999   discectomy- lumbar   TOTAL KNEE ARTHROPLASTY Right 02/02/2015   Procedure:  RIGHT TOTAL KNEE ARTHROPLASTY;  Surgeon: Taft BRAVO Margrette, MD;  Location: [facility];  Service: Orthopedics;  Laterality: Right;  LM with pt's daughter of new arrival time (10:45)     TOTAL KNEE  ARTHROPLASTY Left 07/24/2016   Procedure: TOTAL KNEE ARTHROPLASTY;  Surgeon: Taft BRAVO Margrette, MD;  Location: [facility];  Service: Orthopedics;  Laterality: Left;   TOTAL SHOULDER ARTHROPLASTY Left 11/08/2020   Procedure: TOTAL SHOULDER ARTHROPLASTY;  Surgeon: Onesimo Oneil LABOR, MD;  Location: [facility];  Service: Orthopedics;  Laterality: Left;  Left shoulder Reverse Arthroplasty   ureteroscopic stone manipulation Left 08/08/2021   Patient Active Problem List   Diagnosis Date Noted   Infection of prosthetic shoulder joint 09/26/2021   PICC (peripherally inserted central catheter) in place 09/26/2021   Status post reverse total replacement of left shoulder 09/11/2021   Rotator cuff arthropathy of left shoulder 11/08/2020   Abnormal feces 08/30/2020   Chronic idiopathic constipation 08/30/2020   Colon cancer screening 08/30/2020   Family history of malignant neoplasm of gastrointestinal tract 08/30/2020   History of colonic polyps 08/30/2020   Chest pain 11/28/2019   Symptomatic bradycardia 11/28/2019   Nontraumatic complete tear of left rotator cuff    Hypertension 08/31/2019   S/P left rotator cuff repair/ biceps tenodesis 02/15/19  03/01/2019   Labral tear of long head of left biceps tendon    Partial tear of left subscapularis tendon    Traumatic complete tear of left rotator cuff    Family history of malignant neoplasm of prostate 07/28/2018   History of Graves' disease 11/11/2017   S/P total knee replacement, right 02/02/15 08/12/2017   S/P total knee replacement, left 07/24/16 08/12/2017   Hypercholesterolemia 11/08/2015   History of DVT (deep vein thrombosis) 11/04/2015   History of cocaine abuse (HCC) 11/04/2015   History of heroin abuse (HCC) 11/04/2015   Obesity 11/04/2015   Parasomnia 11/04/2015   Benign paroxysmal positional vertigo 10/29/2015   Controlled type 2 diabetes mellitus without complication (HCC) 10/29/2015   Elevated prostate specific antigen (PSA) 10/29/2015   HX:  long term anticoagulant use 10/29/2015   Sensorineural hearing loss 10/29/2015   Sleep apnea with use of continuous positive airway pressure (CPAP) 10/29/2015   Chronic radicular lumbar pain 10/29/2015   Arthrofibrosis of total knee arthroplasty    Pulmonary embolus (HCC) 01/09/2015   History of pulmonary embolism 01/09/2015   Rheumatoid arthritis (HCC) 02/21/2014   Lumbar spinal stenosis 02/10/2014    PCP: Leila Lucie LABOR, MD  REFERRING PROVIDER: Cristy Earnest, MD  REFERRING DIAG: Z96.612 Presence of left artifical shoulder joint  THERAPY DIAG:  No diagnosis found.  Rationale for Evaluation and Treatment: Rehabilitation  ONSET DATE: ***  SUBJECTIVE:  SUBJECTIVE STATEMENT: *** Hand dominance: {MISC; OT HAND DOMINANCE:[phone]}  PERTINENT HISTORY: ***  PAIN:  Are you having pain? {OPRCPAIN:27236}  PRECAUTIONS: {Therapy precautions:24002}  RED FLAGS: {PT Red Flags:29287}   WEIGHT BEARING RESTRICTIONS: {Yes ***/No:24003}  FALLS:  Has patient fallen in last 6 months? {fallsyesno:27318}  LIVING ENVIRONMENT: Lives with: {OPRC lives with:25569::lives with their family} Lives in: {Lives in:25570} Stairs: {opstairs:27293} Has following equipment at home: {Assistive devices:23999}  OCCUPATION: ***  PLOF: {PLOF:24004}  PATIENT GOALS:***  NEXT MD VISIT:   OBJECTIVE:  Note: Objective measures were completed at Evaluation unless otherwise noted.  DIAGNOSTIC FINDINGS:  ***  PATIENT SURVEYS:  {rehab surveys:24030:a}  COGNITION: Overall cognitive status: {cognition:24006}     SENSATION: {sensation:27233}  POSTURE: ***  UPPER EXTREMITY ROM:   {AROM/PROM:27142} ROM Right eval Left eval  Shoulder flexion    Shoulder extension    Shoulder abduction    Shoulder adduction     Shoulder internal rotation    Shoulder external rotation    Elbow flexion    Elbow extension    Wrist flexion    Wrist extension    Wrist ulnar deviation    Wrist radial deviation    Wrist pronation    Wrist supination    (Blank rows = not tested)  UPPER EXTREMITY MMT:  MMT Right eval Left eval  Shoulder flexion    Shoulder extension    Shoulder abduction    Shoulder adduction    Shoulder internal rotation    Shoulder external rotation    Middle trapezius    Lower trapezius    Elbow flexion    Elbow extension    Wrist flexion    Wrist extension    Wrist ulnar deviation    Wrist radial deviation    Wrist pronation    Wrist supination    Grip strength (lbs)    (Blank rows = not tested)  SHOULDER SPECIAL TESTS: Impingement tests: {shoulder impingement test:25231:a} SLAP lesions: {SLAP lesions:25232} Instability tests: {shoulder instability test:25233} Rotator cuff assessment: {rotator cuff assessment:25234} Biceps assessment: {biceps assessment:25235}  JOINT MOBILITY TESTING:  ***  PALPATION:  ***                                                                                                                             TREATMENT DATE: ***   PATIENT EDUCATION: Education details: *** Person educated: {Person educated:25204} Education method: {Education Method:25205} Education comprehension: {Education Comprehension:25206}  HOME EXERCISE PROGRAM: ***  ASSESSMENT:  CLINICAL IMPRESSION: Patient is a *** y.o. *** who was seen today for physical therapy evaluation and treatment for ***.   OBJECTIVE IMPAIRMENTS: {opptimpairments:25111}.   ACTIVITY LIMITATIONS: {activitylimitations:27494}  PARTICIPATION LIMITATIONS: {participationrestrictions:25113}  PERSONAL FACTORS: {Personal factors:25162} are also affecting patient's functional outcome.   REHAB POTENTIAL: {rehabpotential:25112}  CLINICAL DECISION MAKING: {clinical decision  making:25114}  EVALUATION COMPLEXITY: {Evaluation complexity:25115}   GOALS: Goals reviewed with patient? {yes/no:20286}  SHORT TERM GOALS: Target date: ***  *** Baseline: Goal status:  INITIAL  2.  *** Baseline:  Goal status: INITIAL  3.  *** Baseline:  Goal status: INITIAL  4.  *** Baseline:  Goal status: INITIAL  5.  *** Baseline:  Goal status: INITIAL  6.  *** Baseline:  Goal status: INITIAL  LONG TERM GOALS: Target date: ***  *** Baseline:  Goal status: INITIAL  2.  *** Baseline:  Goal status: INITIAL  3.  *** Baseline:  Goal status: INITIAL  4.  *** Baseline:  Goal status: INITIAL  5.  *** Baseline:  Goal status: INITIAL  6.  *** Baseline:  Goal status: INITIAL  PLAN:  PT FREQUENCY: {rehab frequency:25116}  PT DURATION: {rehab duration:25117}  PLANNED INTERVENTIONS: {rehab planned interventions:25118::97110-Therapeutic exercises,97530- Therapeutic [id number]- Neuromuscular re-education,97535- Self Rjmz,02859- Manual therapy,Patient/Family education}  PLAN FOR NEXT SESSION: ***   Reshunda Strider April Ma L Cedric Denison, PT 07/18/2024, 8:01 AM

## 2024-07-19 ENCOUNTER — Encounter (HOSPITAL_COMMUNITY): Payer: Self-pay | Admitting: Orthopaedic Surgery

## 2024-07-19 ENCOUNTER — Telehealth: Payer: Self-pay

## 2024-07-19 NOTE — Telephone Encounter (Signed)
 Per [facility] was able to accept patient. Will have RN come out today or tomorrow. Lorenda CHRISTELLA Code, RMA

## 2024-07-19 NOTE — Telephone Encounter (Signed)
 Received call from patient's pcp stating [facility] has discharged patient. He is currently without HH to draw labs and manage picc line. Per [facility] patient was discharged due to staffing issues.  Sent message to [facility] with [employer] regarding this. Will need to know how to move forward with home health.  Patient denies any issues with administering antbx at this time. Lorenda CHRISTELLA Code, RMA

## 2024-07-26 ENCOUNTER — Ambulatory Visit: Admitting: Physical Therapy

## 2024-08-12 ENCOUNTER — Inpatient Hospital Stay (HOSPITAL_COMMUNITY)
Admission: EM | Admit: 2024-08-12 | Discharge: 2024-08-16 | DRG: 493 | Disposition: A | Discharge status: Home / Self Care

## 2024-08-12 ENCOUNTER — Emergency Department (HOSPITAL_COMMUNITY)

## 2024-08-12 ENCOUNTER — Encounter (HOSPITAL_COMMUNITY): Payer: Self-pay

## 2024-08-12 ENCOUNTER — Telehealth: Payer: Self-pay

## 2024-08-12 DIAGNOSIS — Z86711 Personal history of pulmonary embolism: Secondary | ICD-10-CM

## 2024-08-12 DIAGNOSIS — G8929 Other chronic pain: Secondary | ICD-10-CM | POA: Diagnosis present

## 2024-08-12 DIAGNOSIS — Z800 Family history of malignant neoplasm of digestive organs: Secondary | ICD-10-CM

## 2024-08-12 DIAGNOSIS — Z86718 Personal history of other venous thrombosis and embolism: Secondary | ICD-10-CM

## 2024-08-12 DIAGNOSIS — M9732XA Periprosthetic fracture around internal prosthetic left shoulder joint, initial encounter: Secondary | ICD-10-CM | POA: Diagnosis present

## 2024-08-12 DIAGNOSIS — E119 Type 2 diabetes mellitus without complications: Secondary | ICD-10-CM | POA: Diagnosis present

## 2024-08-12 DIAGNOSIS — G4730 Sleep apnea, unspecified: Secondary | ICD-10-CM | POA: Diagnosis present

## 2024-08-12 DIAGNOSIS — Z888 Allergy status to other drugs, medicaments and biological substances status: Secondary | ICD-10-CM

## 2024-08-12 DIAGNOSIS — Z8546 Personal history of malignant neoplasm of prostate: Secondary | ICD-10-CM

## 2024-08-12 DIAGNOSIS — G20A1 Parkinson's disease without dyskinesia, without mention of fluctuations: Secondary | ICD-10-CM | POA: Diagnosis present

## 2024-08-12 DIAGNOSIS — G2581 Restless legs syndrome: Secondary | ICD-10-CM | POA: Diagnosis present

## 2024-08-12 DIAGNOSIS — I10 Essential (primary) hypertension: Secondary | ICD-10-CM | POA: Diagnosis present

## 2024-08-12 DIAGNOSIS — Y92009 Unspecified place in unspecified non-institutional (private) residence as the place of occurrence of the external cause: Secondary | ICD-10-CM

## 2024-08-12 DIAGNOSIS — Z79899 Other long term (current) drug therapy: Secondary | ICD-10-CM

## 2024-08-12 DIAGNOSIS — Z96619 Presence of unspecified artificial shoulder joint: Secondary | ICD-10-CM

## 2024-08-12 DIAGNOSIS — S42332A Displaced oblique fracture of shaft of humerus, left arm, initial encounter for closed fracture: Principal | ICD-10-CM | POA: Diagnosis present

## 2024-08-12 DIAGNOSIS — Z96612 Presence of left artificial shoulder joint: Secondary | ICD-10-CM | POA: Diagnosis present

## 2024-08-12 DIAGNOSIS — R54 Age-related physical debility: Secondary | ICD-10-CM | POA: Diagnosis present

## 2024-08-12 DIAGNOSIS — Z96653 Presence of artificial knee joint, bilateral: Secondary | ICD-10-CM | POA: Diagnosis present

## 2024-08-12 DIAGNOSIS — Z6832 Body mass index (BMI) 32.0-32.9, adult: Secondary | ICD-10-CM

## 2024-08-12 DIAGNOSIS — Z87891 Personal history of nicotine dependence: Secondary | ICD-10-CM

## 2024-08-12 DIAGNOSIS — Z801 Family history of malignant neoplasm of trachea, bronchus and lung: Secondary | ICD-10-CM

## 2024-08-12 DIAGNOSIS — W109XXA Fall (on) (from) unspecified stairs and steps, initial encounter: Secondary | ICD-10-CM | POA: Diagnosis present

## 2024-08-12 DIAGNOSIS — Z7984 Long term (current) use of oral hypoglycemic drugs: Secondary | ICD-10-CM

## 2024-08-12 DIAGNOSIS — E669 Obesity, unspecified: Secondary | ICD-10-CM | POA: Diagnosis present

## 2024-08-12 DIAGNOSIS — F32A Depression, unspecified: Secondary | ICD-10-CM | POA: Diagnosis present

## 2024-08-12 DIAGNOSIS — M069 Rheumatoid arthritis, unspecified: Secondary | ICD-10-CM | POA: Diagnosis present

## 2024-08-12 DIAGNOSIS — Z8249 Family history of ischemic heart disease and other diseases of the circulatory system: Secondary | ICD-10-CM

## 2024-08-12 DIAGNOSIS — S4290XA Fracture of unspecified shoulder girdle, part unspecified, initial encounter for closed fracture: Secondary | ICD-10-CM | POA: Diagnosis present

## 2024-08-12 DIAGNOSIS — I2510 Atherosclerotic heart disease of native coronary artery without angina pectoris: Secondary | ICD-10-CM | POA: Diagnosis present

## 2024-08-12 DIAGNOSIS — Z7982 Long term (current) use of aspirin: Secondary | ICD-10-CM

## 2024-08-12 DIAGNOSIS — Z8042 Family history of malignant neoplasm of prostate: Secondary | ICD-10-CM

## 2024-08-12 DIAGNOSIS — N400 Enlarged prostate without lower urinary tract symptoms: Secondary | ICD-10-CM | POA: Diagnosis present

## 2024-08-12 DIAGNOSIS — Z823 Family history of stroke: Secondary | ICD-10-CM

## 2024-08-12 DIAGNOSIS — Z803 Family history of malignant neoplasm of breast: Secondary | ICD-10-CM

## 2024-08-12 DIAGNOSIS — T8459XD Infection and inflammatory reaction due to other internal joint prosthesis, subsequent encounter: Secondary | ICD-10-CM

## 2024-08-12 LAB — CBC WITH DIFFERENTIAL/PLATELET
Abs Immature Granulocytes: 0.03 K/uL (ref 0.00–0.07)
Basophils Absolute: 0 K/uL (ref 0.0–0.1)
Basophils Relative: 0 %
Eosinophils Absolute: 0.1 K/uL (ref 0.0–0.5)
Eosinophils Relative: 1 %
HCT: 37.7 % — ABNORMAL LOW (ref 39.0–52.0)
Hemoglobin: 12.3 g/dL — ABNORMAL LOW (ref 13.0–17.0)
Immature Granulocytes: 0 %
Lymphocytes Relative: 26 %
Lymphs Abs: 2.5 K/uL (ref 0.7–4.0)
MCH: 29.8 pg (ref 26.0–34.0)
MCHC: 32.6 g/dL (ref 30.0–36.0)
MCV: 91.3 fL (ref 80.0–100.0)
Monocytes Absolute: 0.7 K/uL (ref 0.1–1.0)
Monocytes Relative: 7 %
Neutro Abs: 6.3 K/uL (ref 1.7–7.7)
Neutrophils Relative %: 66 %
Platelets: 269 K/uL (ref 150–400)
RBC: 4.13 MIL/uL — ABNORMAL LOW (ref 4.22–5.81)
RDW: 14.2 % (ref 11.5–15.5)
WBC: 9.7 K/uL (ref 4.0–10.5)
nRBC: 0 % (ref 0.0–0.2)

## 2024-08-12 LAB — BASIC METABOLIC PANEL WITH GFR
Anion gap: 10 (ref 5–15)
BUN: 13 mg/dL (ref 8–23)
CO2: 23 mmol/L (ref 22–32)
Calcium: 9.3 mg/dL (ref 8.9–10.3)
Chloride: 104 mmol/L (ref 98–111)
Creatinine, Ser: 0.67 mg/dL (ref 0.61–1.24)
GFR, Estimated: >60 mL/min
Glucose, Bld: 112 mg/dL — ABNORMAL HIGH (ref 70–99)
Potassium: 4 mmol/L (ref 3.5–5.1)
Sodium: 137 mmol/L (ref 135–145)

## 2024-08-12 LAB — CBG MONITORING, ED: Glucose-Capillary: 120 mg/dL — ABNORMAL HIGH (ref 70–99)

## 2024-08-12 MED ORDER — GABAPENTIN 100 MG PO CAPS
100.0000 mg | ORAL_CAPSULE | Freq: Three times a day (TID) | ORAL | Status: DC
  Administered 2024-08-13 – 2024-08-16 (×9): 100 mg via ORAL
  Filled 2024-08-12 (×8): qty 1

## 2024-08-12 MED ORDER — HYDROMORPHONE HCL 1 MG/ML IJ SOLN
0.5000 mg | INTRAMUSCULAR | Status: DC | PRN
  Administered 2024-08-12 – 2024-08-13 (×4): 1 mg via INTRAVENOUS
  Filled 2024-08-12 (×4): qty 1

## 2024-08-12 MED ORDER — ACETAMINOPHEN 650 MG RE SUPP: 650.0000 mg | Freq: Four times a day (QID) | RECTAL | Status: DC | PRN

## 2024-08-12 MED ORDER — OXYCODONE HCL 5 MG PO TABS
5.0000 mg | ORAL_TABLET | Freq: Four times a day (QID) | ORAL | Status: DC | PRN
  Administered 2024-08-12 – 2024-08-13 (×2): 5 mg via ORAL
  Filled 2024-08-12 (×2): qty 1

## 2024-08-12 MED ORDER — HYDROMORPHONE HCL 1 MG/ML IJ SOLN
1.0000 mg | Freq: Once | INTRAMUSCULAR | Status: AC
  Administered 2024-08-12: 1 mg via INTRAVENOUS
  Filled 2024-08-12: qty 1

## 2024-08-12 MED ORDER — ACETAMINOPHEN 325 MG PO TABS
650.0000 mg | ORAL_TABLET | Freq: Four times a day (QID) | ORAL | Status: DC | PRN
  Administered 2024-08-13: 650 mg via ORAL
  Filled 2024-08-12: qty 2

## 2024-08-12 MED ORDER — DOXYCYCLINE HYCLATE 100 MG PO TABS
100.0000 mg | ORAL_TABLET | Freq: Two times a day (BID) | ORAL | Status: DC
  Administered 2024-08-13 – 2024-08-16 (×6): 100 mg via ORAL
  Filled 2024-08-12 (×6): qty 1

## 2024-08-12 MED ORDER — ASPIRIN 81 MG PO CHEW
81.0000 mg | CHEWABLE_TABLET | Freq: Two times a day (BID) | ORAL | Status: DC
  Administered 2024-08-13 – 2024-08-16 (×7): 81 mg via ORAL
  Filled 2024-08-12 (×6): qty 1

## 2024-08-12 MED ORDER — AMLODIPINE BESYLATE 5 MG PO TABS
5.0000 mg | ORAL_TABLET | Freq: Every day | ORAL | Status: DC
  Administered 2024-08-13 – 2024-08-16 (×4): 5 mg via ORAL
  Filled 2024-08-12 (×2): qty 1

## 2024-08-12 MED ORDER — FOLIC ACID 1 MG PO TABS
1.0000 mg | ORAL_TABLET | Freq: Every day | ORAL | Status: DC
  Administered 2024-08-13 – 2024-08-16 (×4): 1 mg via ORAL
  Filled 2024-08-12 (×2): qty 1

## 2024-08-12 MED ORDER — ROPINIROLE HCL 0.5 MG PO TABS
1.5000 mg | ORAL_TABLET | Freq: Three times a day (TID) | ORAL | Status: DC
  Administered 2024-08-12 – 2024-08-16 (×10): 1.5 mg via ORAL
  Filled 2024-08-12 (×3): qty 3
  Filled 2024-08-12: qty 2
  Filled 2024-08-12 (×3): qty 3

## 2024-08-12 MED ORDER — TAMSULOSIN HCL 0.4 MG PO CAPS
0.4000 mg | ORAL_CAPSULE | Freq: Every day | ORAL | Status: DC
  Administered 2024-08-12 – 2024-08-15 (×4): 0.4 mg via ORAL
  Filled 2024-08-12 (×4): qty 1

## 2024-08-12 MED ORDER — HYDROMORPHONE HCL 1 MG/ML IJ SOLN
0.5000 mg | Freq: Once | INTRAMUSCULAR | Status: AC
  Administered 2024-08-12: 0.5 mg via INTRAVENOUS
  Filled 2024-08-12: qty 1

## 2024-08-12 MED ORDER — ONDANSETRON HCL 4 MG/2ML IJ SOLN
4.0000 mg | Freq: Once | INTRAMUSCULAR | Status: AC
  Administered 2024-08-12: 4 mg via INTRAVENOUS
  Filled 2024-08-12: qty 2

## 2024-08-12 MED ORDER — FENTANYL CITRATE (PF) 50 MCG/ML IJ SOSY
50.0000 ug | PREFILLED_SYRINGE | Freq: Once | INTRAMUSCULAR | Status: AC
  Administered 2024-08-12: 50 ug via INTRAVENOUS
  Filled 2024-08-12: qty 1

## 2024-08-12 NOTE — ED Provider Notes (Signed)
 " [place] [facility] Provider Note   CSN: [id number] Arrival date & time: 08/12/24  1555     History  Chief Complaint  Patient presents with   Willie Howe ORN Willie Howe is a 63 y.o. male with PMH as listed below who presents bib [facility] from home. Pt missed a step and fell 29ft from a ladder. Recent revision L shoulder reverse arthroplasty on 07/13/24 w/ Dr. Cristy, reports this is his 3rd surgery on his L shoulder. Currently receiving abx through R PICC line for septic shoulder. Pt has L humerus deformity. Pain 10/10. No head injury, no LOC, no thinner. Did not get pain meds w/ EMS d/t lack of access. Did not hit his head or lose consciousness.   BP 177/97 HR 98 100% RA  RR 20    Past Medical History:  Diagnosis Date   Arthritis    BPPV (benign paroxysmal positional vertigo)    Bradycardia    Cataract    DVT (deep venous thrombosis) (HCC)    Elevated prostate specific antigen (PSA)    Graves disease 1996   Graves disease    hx of   History of kidney stones    History of pulmonary embolism    History of substance use disorder    Hypertension    Infection of prosthetic joint    Kidney stones    Obesity    Parkinson disease (HCC)    Prostate cancer (HCC)    June 2025   Rheumatoid arthritis (HCC)    Managed by Dr. Jon Jacob (Rheumatology)   Sleep apnea    Intolerant of CPAP   Type 2 diabetes mellitus (HCC)        Home Medications Prior to Admission medications  Medication Sig Start Date End Date Taking? Authorizing Provider  amLODipine  (NORVASC ) 10 MG tablet Take 5 mg by mouth daily. 09/24/23   [provider]  aspirin  (ASPIRIN  CHILDRENS) 81 MG chewable tablet Chew 1 tablet (81 mg total) by mouth 2 (two) times daily. For 6 weeks for DVT prophylaxis after surgery 07/15/24 08/26/24  McBane, Caroline N, PA-C  cefTRIAXone  (ROCEPHIN ) IVPB Inject 2 g into the vein daily. Indication:  prosthetic joint infection First  Dose: No Last Day of Therapy:  08/24/2024 Labs - Once weekly:  CBC/D and BMP, Labs - Once weekly: ESR and CRP Method of administration: IV Push Method of administration may be changed at the discretion of home infusion pharmacist based upon assessment of the patient and/or caregiver's ability to self-administer the medication ordered. 07/14/24 08/25/24  Vu, Constance T, MD  celecoxib  (CELEBREX ) 100 MG capsule Take 1 capsule (100 mg total) by mouth 2 (two) times daily. For 2 weeks. Then take as needed 07/15/24 08/14/24  McBane, Caroline N, PA-C  docusate sodium  (COLACE) 100 MG capsule Take 200 mg by mouth every other day.    [provider]  doxycycline  (VIBRAMYCIN ) 100 MG capsule Take 1 capsule (100 mg total) by mouth 2 (two) times daily. 07/15/24 08/25/24  Cristy Bonner DASEN, MD  folic acid  (FOLVITE ) 1 MG tablet Take 1 mg by mouth daily.    [provider]  gabapentin  (NEURONTIN ) 100 MG capsule Take 1 capsule (100 mg total) by mouth 3 (three) times daily. For pain 07/15/24   Jennye Aleck SAILOR, PA-C  hydrOXYzine  (ATARAX ) 25 MG tablet Take 1 tablet (25 mg total) by mouth every 8 (eight) hours as needed for up to 20 doses for  itching. Patient not taking: Reported on 07/04/2024 12/05/22   Ethyl Richerd BROCKS, MD  [Paused] inFLIXimab -abda (RENFLEXIS  IV) Inject 1 Dose into the vein every 6 (six) weeks. Wait to take this until your doctor or other care provider tells you to start again.    [provider]  loratadine  (CLARITIN ) 10 MG tablet Take 10 mg by mouth daily.    [provider]  metFORMIN  (GLUCOPHAGE -XR) 500 MG 24 hr tablet Take 500 mg by mouth 2 (two) times daily. 06/13/24   [provider]  [Paused] Methotrexate  Sodium (METHOTREXATE , PF,) 50 MG/2ML injection Inject 0.8 mLs into the muscle once a week. Wait to take this until your doctor or other care provider tells you to start again. 06/18/19   [provider]  [Paused] predniSONE  (DELTASONE ) 5 MG  tablet Take 5 mg by mouth daily. Wait to take this until your doctor or other care provider tells you to start again.    [provider]  rOPINIRole  (REQUIP ) 1 MG tablet Take 1.5 tablets (1.5 mg total) by mouth 3 (three) times daily. 12/30/23   Whitfield Raisin, NP  tamsulosin  (FLOMAX ) 0.4 MG CAPS capsule Take 0.4 mg by mouth at bedtime.     [provider]      Allergies    Lisinopril and Statins    Review of Systems   Review of Systems A 10 point review of systems was performed and is negative unless otherwise reported in HPI.  Physical Exam Updated Vital Signs BP (!) 147/81 (BP Location: Right Arm)   Pulse 99   Temp 97.8 F (36.6 C) (Oral)   SpO2 100%  Physical Exam General: Uncomfortable appearing male, lying in bed.  HEENT: PERRLA, Sclera anicteric, MMM, trachea midline. NCAT.  Cardiology: RRR, no murmurs/rubs/gallops. BL radial pulses equal bilaterally.  Resp: Normal respiratory rate and effort. CTAB, no wheezes, rhonchi, crackles.  Abd: Soft, non-tender, non-distended. No rebound tenderness or guarding.  GU: Deferred. MSK: +deformity to mid humerus on L side w/ swelling/bruising present. Well-healed L shoulder incision anteriorly. NVI. Compartments okay.  Skin: warm, dry. No rashes or lesions. Back: No midline C-spine TTP deformities/stepoffs Neuro: A&Ox4, CNs II-XII grossly intact. MAEs. Sensation grossly intact.  Psych: Normal mood and affect.   ED Results / Procedures / Treatments   Labs (all labs ordered are listed, but only abnormal results are displayed) Labs Reviewed  BASIC METABOLIC PANEL WITH GFR - Abnormal; Notable for the following components:      Result Value   Glucose, Bld 112 (*)    All other components within normal limits  CBC WITH DIFFERENTIAL/PLATELET - Abnormal; Notable for the following components:   RBC 4.13 (*)    Hemoglobin 12.3 (*)    HCT 37.7 (*)    All other components within normal limits  CBG MONITORING, ED - Abnormal;  Notable for the following components:   Glucose-Capillary 120 (*)    All other components within normal limits    EKG None  Radiology DG Chest Portable 1 View Result Date: 08/12/2024 EXAM: 1 VIEW(S) XRAY OF THE CHEST 08/12/2024 05:29:00 PM COMPARISON: CXR 11/27/2019, CT Chest 11/28/2019. CLINICAL HISTORY: fall FINDINGS: LINES, TUBES AND DEVICES: Right upper extremity PICC in place with tip just proximal to the superior cavoatrial junction. LUNGS AND PLEURA: No focal pulmonary opacity. No pleural effusion. No pneumothorax. HEART AND MEDIASTINUM: No acute abnormality of the cardiac and mediastinal silhouettes. BONES AND SOFT TISSUES: Status post left reverse total arthroplasty. No acute osseous abnormality. IMPRESSION: 1.  No acute cardiopulmonary abnormality. 2. Right upper extremity PICC tip just proximal to the superior cavoatrial junction. Electronically signed by: Morgane Naveau MD 08/12/2024 06:08 PM EST RP Workstation: [id number]   DG Humerus Left Result Date: 08/12/2024 EXAM: 1 VIEW(S) XRAY OF THE LEFT HUMERUS 08/12/2024 05:29:00 PM COMPARISON: xr left shoulder 07/13/24 CLINICAL HISTORY: fall/l shoulder deformity FINDINGS: BONES AND JOINTS: Intact left reverse shoulder arthroplasty hardware. Acute, greater than half shaft width displaced, left mid humeral shaft periprosthetic fracture. SOFT TISSUES: The soft tissues are unremarkable. IMPRESSION: 1. Acute, greater than half shaft width displaced, left mid humeral shaft periprosthetic fracture. 2. Intact left reverse shoulder arthroplasty hardware. Electronically signed by: Morgane Naveau MD 08/12/2024 06:07 PM EST RP Workstation: [id number]   DG Shoulder Left Result Date: 08/12/2024 EXAM: 1 VIEW(S) XRAY OF THE LEFT SHOULDER 08/12/2024 05:29:00 PM COMPARISON: X-ray left shoulder 07/13/2024. CLINICAL HISTORY: fall/l shoulder deformity FINDINGS: BONES AND JOINTS: Left shoulder reverse arthroplasty noted. Intact hardware. Acute displaced left mid  humeral shaft periprosthetic fracture. [facility] joint is unremarkable. SOFT TISSUES: No abnormal calcifications. Visualized lung is unremarkable. IMPRESSION: 1. Acute displaced left mid humeral shaft periprosthetic fracture. 2. Left shoulder reverse arthroplasty with intact hardware. Electronically signed by: Morgane Naveau MD 08/12/2024 06:05 PM EST RP Workstation: [id number]    Procedures Procedures    Medications Ordered in ED Medications  fentaNYL  (SUBLIMAZE ) injection 50 mcg (50 mcg Intravenous Given 08/12/24 1626)  ondansetron  (ZOFRAN ) injection 4 mg (4 mg Intravenous Given 08/12/24 1710)  HYDROmorphone  (DILAUDID ) injection 1 mg (1 mg Intravenous Given 08/12/24 1711)  HYDROmorphone  (DILAUDID ) injection 0.5 mg (0.5 mg Intravenous Given 08/12/24 1922)    ED Course/ Medical Decision Making/ A&P                          Medical Decision Making Amount and/or Complexity of Data Reviewed Labs: ordered. Radiology: ordered. Decision-making details documented in ED Course.  Risk Prescription drug management. Decision regarding hospitalization.    This patient presents to the ED for concern of fall w/ left arm pain, this involves an extensive number of treatment options, and is a complaint that carries with it a high risk of complications and morbidity.  I considered the following differential and admission for this acute, potentially life threatening condition.   MDM:    DDX for trauma includes but is not limited to:  -Head Injury such as skull fx or ICH - no head trauma reported, no LOC, no N/V, rules out by canadian head CT crtieria -Chest Injury and Abdominal Injury - including hemo/pneumothorax, cardiac, abdominal solid and hollow organ injury -Vertebral injury - no midline C-spine TTP, no pain, rules out by canadian c-spine criteria -Fractures - likely humeral fx based on exam; swelling present but no signs of compartment syndrome   Clinical Course as of 08/13/24 1256  Fri  Aug 12, 2024  1704 DG Humerus Left +displaced shaft fx. Consulting to ortho [HN]  1811 DG Chest Portable 1 View 1. No acute cardiopulmonary abnormality. 2. Right upper extremity PICC tip just proximal to the superior cavoatrial junction.   [HN]  1811 DG Humerus Left 1. Acute, greater than half shaft width displaced, left mid humeral shaft periprosthetic fracture. 2. Intact left reverse shoulder arthroplasty hardware.   [HN]  1823 Repaging ortho [HN]  1833 D/w Dr. Edna who will contact Dr. Cristy  and set up consult for repair. Recommends coaptation splint. Patient in severe pain, will require admission. Will obtain labs and  admit to medicine. [HN]    Clinical Course User Index [HN] Franklyn Sid SAILOR, MD    Labs: I Ordered, and personally interpreted labs.  The pertinent results include:  those listed above  Imaging Studies ordered: I ordered imaging studies including CXR, XR LUE I independently visualized and interpreted imaging. I agree with the radiologist interpretation  Additional history obtained from chart review, family at bedside, EMS.    Cardiac Monitoring: The patient was maintained on a cardiac monitor.  I personally viewed and interpreted the cardiac monitored which showed an underlying rhythm of: NSR  Reevaluation: After the interventions noted above, I reevaluated the patient and found that they have :improved  Social Determinants of Health: Lives independently  Disposition:  Admit to medicine  Co morbidities that complicate the patient evaluation  Past Medical History:  Diagnosis Date   Arthritis    BPPV (benign paroxysmal positional vertigo)    Bradycardia    Cataract    DVT (deep venous thrombosis) (HCC)    Elevated prostate specific antigen (PSA)    Graves disease 1996   Graves disease    hx of   History of kidney stones    History of pulmonary embolism    History of substance use disorder    Hypertension    Infection of prosthetic  joint    Kidney stones    Obesity    Parkinson disease (HCC)    Prostate cancer (HCC)    June 2025   Rheumatoid arthritis (HCC)    Managed by Dr. Jon Jacob (Rheumatology)   Sleep apnea    Intolerant of CPAP   Type 2 diabetes mellitus (HCC)      Medicines Meds ordered this encounter  Medications   fentaNYL  (SUBLIMAZE ) injection 50 mcg    I have reviewed the patients home medicines and have made adjustments as needed  Problem List / ED Course: Problem List Items Addressed This Visit   None Visit Diagnoses       Closed displaced oblique fracture of shaft of left humerus, initial encounter    -  Primary                   This note was created using dictation software, which may contain spelling or grammatical errors.    Franklyn Sid SAILOR, MD 08/13/24 1256  "

## 2024-08-12 NOTE — Progress Notes (Signed)
 Orthopedic Tech Progress Note Patient Details:  Willie Howe [birth year] [id number]  Ortho Devices Type of Ortho Device: Coapt Ortho Device/Splint Location: LUE Ortho Device/Splint Interventions: Ordered, Application, Adjustment   Post Interventions Patient Tolerated: Well Instructions Provided: Care of device, Adjustment of device  [name] 08/12/2024, 7:49 PM

## 2024-08-12 NOTE — ED Notes (Signed)
 Patient is NPO and declined to take medication d/t some of the medications requiring him to eat before taking.

## 2024-08-12 NOTE — H&P (View-Only) (Signed)
 ORTHOPAEDIC CONSULTATION  REQUESTING PHYSICIAN: Dena Charleston, MD  Chief Complaint: Left arm injury  HPI: Willie Howe is a 63 y.o. male who has a complicated history with his left shoulder and a multiply revised shoulder arthroplasty, most recently had surgery with Dr. Cristy 1 month ago on 11/26.SABRA  He is currently receiving IV antibiotics through a PICC line as well.  He reports a fall down 5 steps today injuring his left arm.  X-rays demonstrate a displaced left humeral shaft fracture.  He denies pain in the joints or extremities.  He denies distal numbness and tingling.  Past Medical History:  Diagnosis Date   Arthritis    BPPV (benign paroxysmal positional vertigo)    Bradycardia    Cataract    DVT (deep venous thrombosis) (HCC)    Elevated prostate specific antigen (PSA)    Graves disease 1996   Graves disease    hx of   History of kidney stones    History of pulmonary embolism    History of substance use disorder    Hypertension    Infection of prosthetic joint    Kidney stones    Obesity    Parkinson disease (HCC)    Prostate cancer (HCC)    June 2025   Rheumatoid arthritis (HCC)    Managed by Dr. Jon Jacob (Rheumatology)   Sleep apnea    Intolerant of CPAP   Type 2 diabetes mellitus (HCC)    Past Surgical History:  Procedure Laterality Date   BACK SURGERY  2015   COLONOSCOPY     EXAM UNDER ANESTHESIA WITH MANIPULATION OF KNEE Right 04/18/2015   Procedure: MANIPULATION OF RIGHT KNEE UNDER ANESTHESIA;  Surgeon: Taft FORBES Minerva, MD;  Location: [facility];  Service: Orthopedics;  Laterality: Right;   IVC Filter     removed December 2016   IVC FILTER REMOVAL  04/24/2015   KIDNEY STONE SURGERY  2010   KNEE ARTHROSCOPY Right 2006   meniscus    KNEE ARTHROSCOPY WITH MEDIAL MENISECTOMY Left 04/24/2016   Procedure: LEFT KNEE ARTHROSCOPY WITH PARTIAL MEDIAL MENISECTOMY;  Surgeon: Taft FORBES Minerva, MD;  Location: [facility];  Service: Orthopedics;  Laterality:  Left;   KNEE JOINT MANIPULATION Right 04/18/2015   KNEE SURGERY     x 2   KNEE SURGERY Left 1976   ?ligament repair    LEFT HEART CATH AND CORONARY ANGIOGRAPHY N/A 11/29/2019   Procedure: LEFT HEART CATH AND CORONARY ANGIOGRAPHY;  Surgeon: Jordan, Peter M, MD;  Location: [facility] INVASIVE CV LAB;  Service: Cardiovascular;  Laterality: N/A;   LITHOTRIPSY     LUMBAR WOUND DEBRIDEMENT N/A 02/23/2014   Procedure: LUMBAR WOUND DEBRIDEMENT;  Surgeon: Darina MALVA Boehringer, MD;  Location: [facility];  Service: Neurosurgery;  Laterality: N/A;  exploration lumbar wound. repair of dural defect.   MENISECTOMY Right    open medial    NEPHROLITHOTOMY     PERIPHERAL VASCULAR CATHETERIZATION N/A 01/30/2015   Procedure: IVC Filter Insertion;  Surgeon: Gaile LELON New, MD;  Location: [facility];  Service: Cardiovascular;  Laterality: N/A;   PERIPHERAL VASCULAR CATHETERIZATION N/A 04/24/2015   Procedure: IVC Filter Removal;  Surgeon: Gaile LELON New, MD;  Location: [facility];  Service: Cardiovascular;  Laterality: N/A;   PERIPHERAL VASCULAR CATHETERIZATION N/A 08/14/2015   Procedure: IVC Filter Removal;  Surgeon: Gaile LELON New, MD;  Location: [facility];  Service: Cardiovascular;  Laterality: N/A;   REVISION TOTAL SHOULDER TO REVERSE TOTAL  SHOULDER Left 09/11/2021   Procedure: REVISION TOTAL SHOULDER TO REVERSE TOTAL SHOULDER;  Surgeon: Cristy Bonner DASEN, MD;  Location: [facility];  Service: Orthopedics;  Laterality: Left;   REVISION TOTAL SHOULDER TO REVERSE TOTAL SHOULDER Left 07/13/2024   Procedure: REVISION, REVERSE TOTAL ARTHROPLASTY, SHOULDER;  Surgeon: Cristy Bonner DASEN, MD;  Location: [facility];  Service: Orthopedics;  Laterality: Left;  revision total shoulder arthroplasty, synovectomy   SHOULDER ARTHROSCOPY WITH OPEN ROTATOR CUFF REPAIR Left 11/10/2019   Procedure: SHOULDER ARTHROSCOPY WITH OPEN ROTATOR CUFF REPAIR;  Surgeon: Margrette Taft BRAVO, MD;  Location: [facility];  Service: Orthopedics;  Laterality:  Left;  pt tested + on 2/23, does not need Covid test < 90 days   SHOULDER ARTHROSCOPY WITH ROTATOR CUFF REPAIR AND OPEN BICEPS TENODESIS Left 02/15/2019   Procedure: SHOULDER ARTHROSCOPY WITH open ROTATOR CUFF REPAIR AND OPEN BICEPS TENODESIS;  Surgeon: Margrette Taft BRAVO, MD;  Location: [facility];  Service: Orthopedics;  Laterality: Left;   SPINE SURGERY  2015   Dr Carles Fusion    SPINE SURGERY  1999   discectomy- lumbar   SYNOVIAL BIOPSY Left 07/13/2024   Procedure: BIOPSY, SYNOVIUM;  Surgeon: Cristy Bonner DASEN, MD;  Location: [facility];  Service: Orthopedics;  Laterality: Left;   TOTAL KNEE ARTHROPLASTY Right 02/02/2015   Procedure:  RIGHT TOTAL KNEE ARTHROPLASTY;  Surgeon: Taft BRAVO Margrette, MD;  Location: [facility];  Service: Orthopedics;  Laterality: Right;  LM with pt's daughter of new arrival time (10:45)     TOTAL KNEE ARTHROPLASTY Left 07/24/2016   Procedure: TOTAL KNEE ARTHROPLASTY;  Surgeon: Taft BRAVO Margrette, MD;  Location: [facility];  Service: Orthopedics;  Laterality: Left;   TOTAL SHOULDER ARTHROPLASTY Left 11/08/2020   Procedure: TOTAL SHOULDER ARTHROPLASTY;  Surgeon: Onesimo Oneil LABOR, MD;  Location: [facility];  Service: Orthopedics;  Laterality: Left;  Left shoulder Reverse Arthroplasty   ureteroscopic stone manipulation Left 08/08/2021   Social History   Socioeconomic History   Marital status: Married    Spouse name: Not on file   Number of children: 4   Years of education: College   Highest education level: Not on file  Occupational History   Occupation: N/A  Tobacco Use   Smoking status: Former    Current packs/day: 0.00    Average packs/day: 1.5 packs/day for 20.0 years (30.0 ttl pk-yrs)    Types: Cigarettes    Start date: 02/03/1975    Quit date: 02/03/1995    Years since quitting: 29.5   Smokeless tobacco: Never  Vaping Use   Vaping status: Never Used  Substance and Sexual Activity   Alcohol  use: No    Alcohol /week: 0.0 standard drinks of alcohol    Drug use: No     Comment: quit heroin and cocaine in 1994   Sexual activity: Yes  Other Topics Concern   Not on file  Social History Narrative   Drinks about 1 cup of coffee a day    Social Drivers of Health   Tobacco Use: Medium Risk (08/12/2024)   Patient History    Smoking Tobacco Use: Former    Smokeless Tobacco Use: Never    Passive Exposure: Not on Actuary Strain: Not on file  Food Insecurity: No Food Insecurity (07/13/2024)   Epic    Worried About Programme Researcher, Broadcasting/film/video in the Last Year: Never true    Ran Out of Food in the Last Year: Never true  Transportation Needs: No Transportation Needs (07/13/2024)   Epic  Lack of Transportation (Medical): No    Lack of Transportation (Non-Medical): No  Physical Activity: Not on file  Stress: Not on file  Social Connections: Not on file  Depression (PHQ2-9): Low Risk (10/25/2021)   Depression (PHQ2-9)    PHQ-2 Score: 0  Alcohol  Screen: Not on file  Housing: Low Risk (07/13/2024)   Epic    Unable to Pay for Housing in the Last Year: No    Number of Times Moved in the Last Year: 0    Homeless in the Last Year: No  Utilities: Not At Risk (07/13/2024)   Epic    Threatened with loss of utilities: No  Health Literacy: Not on file   Family History  Problem Relation Age of Onset   Deep vein thrombosis Mother    Hypertension Mother    Breast cancer Mother    Hypertension Father    Prostate cancer Father    Colon cancer Father    Breast cancer Sister    CAD Sister    Stroke Maternal Aunt    Prostate cancer Paternal Uncle    Tremor Maternal Grandmother    Lung cancer Other    Glaucoma Neg Hx    Parkinson's disease Neg Hx    Allergies[1]   Positive ROS: All other systems have been reviewed and were otherwise negative with the exception of those mentioned in the HPI and as above.  Physical Exam: General: Alert, no acute distress Cardiovascular: No pedal edema Respiratory: No cyanosis, no use of accessory  musculature Skin: No lesions in the area of chief complaint Neurologic: Sensation intact distally Psychiatric: Patient is competent for consent with normal mood and affect  MUSCULOSKELETAL:  LUE No traumatic wounds, ecchymosis, or rash  No swelling, tenderness, deformity about the left upper arm No elbow or wrist effusion  Sens median, radial, ulnar intact  Motor AIN, PIN, IO intact  Radial pulse 2+, No significant edema  IMAGING: X-rays show a displaced periprosthetic humerus fracture just distal to the humeral stem, well position shoulder arthroplasty components without adverse features  Assessment: Principal Problem:   Shoulder fracture   Left periprosthetic humeral shaft fracture  Plan: X-rays unfortunately show a displaced periprosthetic humeral shaft fracture.  He is to be nonweightbearing in the left upper extremity in a coaptation splint which was applied by the Ortho tech team in the emergency department.  His findings were discussed with Dr. Cristy who will be in touch with him and arrange time for surgery in the next week.  Patient is okay to discharge home if pain controlled and await further information about surgery from Dr. Raguel team.    TORIBIO DELENA HIGASHI, MD  Contact information:   [phone] 7am-5pm epic message Dr. Higashi, or call office for patient follow up: [phone] After hours and holidays please check [email or web address] for group call information for [facility]    [1]  Allergies Allergen Reactions   Lisinopril Cough   Statins Other (See Comments)    Muscle pain

## 2024-08-12 NOTE — Consult Note (Signed)
 ORTHOPAEDIC CONSULTATION  REQUESTING PHYSICIAN: Dena Charleston, MD  Chief Complaint: Left arm injury  HPI: Willie Howe is a 63 y.o. male who has a complicated history with his left shoulder and a multiply revised shoulder arthroplasty, most recently had surgery with Dr. Cristy 1 month ago on 11/26.SABRA  He is currently receiving IV antibiotics through a PICC line as well.  He reports a fall down 5 steps today injuring his left arm.  X-rays demonstrate a displaced left humeral shaft fracture.  He denies pain in the joints or extremities.  He denies distal numbness and tingling.  Past Medical History:  Diagnosis Date   Arthritis    BPPV (benign paroxysmal positional vertigo)    Bradycardia    Cataract    DVT (deep venous thrombosis) (HCC)    Elevated prostate specific antigen (PSA)    Graves disease 1996   Graves disease    hx of   History of kidney stones    History of pulmonary embolism    History of substance use disorder    Hypertension    Infection of prosthetic joint    Kidney stones    Obesity    Parkinson disease (HCC)    Prostate cancer (HCC)    June 2025   Rheumatoid arthritis (HCC)    Managed by Dr. Jon Jacob (Rheumatology)   Sleep apnea    Intolerant of CPAP   Type 2 diabetes mellitus (HCC)    Past Surgical History:  Procedure Laterality Date   BACK SURGERY  2015   COLONOSCOPY     EXAM UNDER ANESTHESIA WITH MANIPULATION OF KNEE Right 04/18/2015   Procedure: MANIPULATION OF RIGHT KNEE UNDER ANESTHESIA;  Surgeon: Taft FORBES Minerva, MD;  Location: [facility];  Service: Orthopedics;  Laterality: Right;   IVC Filter     removed December 2016   IVC FILTER REMOVAL  04/24/2015   KIDNEY STONE SURGERY  2010   KNEE ARTHROSCOPY Right 2006   meniscus    KNEE ARTHROSCOPY WITH MEDIAL MENISECTOMY Left 04/24/2016   Procedure: LEFT KNEE ARTHROSCOPY WITH PARTIAL MEDIAL MENISECTOMY;  Surgeon: Taft FORBES Minerva, MD;  Location: [facility];  Service: Orthopedics;  Laterality:  Left;   KNEE JOINT MANIPULATION Right 04/18/2015   KNEE SURGERY     x 2   KNEE SURGERY Left 1976   ?ligament repair    LEFT HEART CATH AND CORONARY ANGIOGRAPHY N/A 11/29/2019   Procedure: LEFT HEART CATH AND CORONARY ANGIOGRAPHY;  Surgeon: Jordan, Peter M, MD;  Location: [facility];  Service: Cardiovascular;  Laterality: N/A;   LITHOTRIPSY     LUMBAR WOUND DEBRIDEMENT N/A 02/23/2014   Procedure: LUMBAR WOUND DEBRIDEMENT;  Surgeon: Darina MALVA Boehringer, MD;  Location: [facility];  Service: Neurosurgery;  Laterality: N/A;  exploration lumbar wound. repair of dural defect.   MENISECTOMY Right    open medial    NEPHROLITHOTOMY     PERIPHERAL VASCULAR CATHETERIZATION N/A 01/30/2015   Procedure: IVC Filter Insertion;  Surgeon: Gaile LELON New, MD;  Location: [facility];  Service: Cardiovascular;  Laterality: N/A;   PERIPHERAL VASCULAR CATHETERIZATION N/A 04/24/2015   Procedure: IVC Filter Removal;  Surgeon: Gaile LELON New, MD;  Location: [facility];  Service: Cardiovascular;  Laterality: N/A;   PERIPHERAL VASCULAR CATHETERIZATION N/A 08/14/2015   Procedure: IVC Filter Removal;  Surgeon: Gaile LELON New, MD;  Location: [facility];  Service: Cardiovascular;  Laterality: N/A;   REVISION TOTAL SHOULDER TO REVERSE TOTAL  SHOULDER Left 09/11/2021   Procedure: REVISION TOTAL SHOULDER TO REVERSE TOTAL SHOULDER;  Surgeon: Cristy Bonner DASEN, MD;  Location: [facility];  Service: Orthopedics;  Laterality: Left;   REVISION TOTAL SHOULDER TO REVERSE TOTAL SHOULDER Left 07/13/2024   Procedure: REVISION, REVERSE TOTAL ARTHROPLASTY, SHOULDER;  Surgeon: Cristy Bonner DASEN, MD;  Location: [facility];  Service: Orthopedics;  Laterality: Left;  revision total shoulder arthroplasty, synovectomy   SHOULDER ARTHROSCOPY WITH OPEN ROTATOR CUFF REPAIR Left 11/10/2019   Procedure: SHOULDER ARTHROSCOPY WITH OPEN ROTATOR CUFF REPAIR;  Surgeon: Margrette Taft BRAVO, MD;  Location: [facility];  Service: Orthopedics;  Laterality:  Left;  pt tested + on 2/23, does not need Covid test < 90 days   SHOULDER ARTHROSCOPY WITH ROTATOR CUFF REPAIR AND OPEN BICEPS TENODESIS Left 02/15/2019   Procedure: SHOULDER ARTHROSCOPY WITH open ROTATOR CUFF REPAIR AND OPEN BICEPS TENODESIS;  Surgeon: Margrette Taft BRAVO, MD;  Location: [facility];  Service: Orthopedics;  Laterality: Left;   SPINE SURGERY  2015   Dr Carles Fusion    SPINE SURGERY  1999   discectomy- lumbar   SYNOVIAL BIOPSY Left 07/13/2024   Procedure: BIOPSY, SYNOVIUM;  Surgeon: Cristy Bonner DASEN, MD;  Location: [facility];  Service: Orthopedics;  Laterality: Left;   TOTAL KNEE ARTHROPLASTY Right 02/02/2015   Procedure:  RIGHT TOTAL KNEE ARTHROPLASTY;  Surgeon: Taft BRAVO Margrette, MD;  Location: [facility];  Service: Orthopedics;  Laterality: Right;  LM with pt's daughter of new arrival time (10:45)     TOTAL KNEE ARTHROPLASTY Left 07/24/2016   Procedure: TOTAL KNEE ARTHROPLASTY;  Surgeon: Taft BRAVO Margrette, MD;  Location: [facility];  Service: Orthopedics;  Laterality: Left;   TOTAL SHOULDER ARTHROPLASTY Left 11/08/2020   Procedure: TOTAL SHOULDER ARTHROPLASTY;  Surgeon: Onesimo Oneil LABOR, MD;  Location: [facility];  Service: Orthopedics;  Laterality: Left;  Left shoulder Reverse Arthroplasty   ureteroscopic stone manipulation Left 08/08/2021   Social History   Socioeconomic History   Marital status: Married    Spouse name: Not on file   Number of children: 4   Years of education: College   Highest education level: Not on file  Occupational History   Occupation: N/A  Tobacco Use   Smoking status: Former    Current packs/day: 0.00    Average packs/day: 1.5 packs/day for 20.0 years (30.0 ttl pk-yrs)    Types: Cigarettes    Start date: 02/03/1975    Quit date: 02/03/1995    Years since quitting: 29.5   Smokeless tobacco: Never  Vaping Use   Vaping status: Never Used  Substance and Sexual Activity   Alcohol  use: No    Alcohol /week: 0.0 standard drinks of alcohol    Drug use: No     Comment: quit heroin and cocaine in 1994   Sexual activity: Yes  Other Topics Concern   Not on file  Social History Narrative   Drinks about 1 cup of coffee a day    Social Drivers of Health   Tobacco Use: Medium Risk (08/12/2024)   Patient History    Smoking Tobacco Use: Former    Smokeless Tobacco Use: Never    Passive Exposure: Not on Actuary Strain: Not on file  Food Insecurity: No Food Insecurity (07/13/2024)   Epic    Worried About Programme Researcher, Broadcasting/film/video in the Last Year: Never true    Ran Out of Food in the Last Year: Never true  Transportation Needs: No Transportation Needs (07/13/2024)   Epic  Lack of Transportation (Medical): No    Lack of Transportation (Non-Medical): No  Physical Activity: Not on file  Stress: Not on file  Social Connections: Not on file  Depression (PHQ2-9): Low Risk (10/25/2021)   Depression (PHQ2-9)    PHQ-2 Score: 0  Alcohol  Screen: Not on file  Housing: Low Risk (07/13/2024)   Epic    Unable to Pay for Housing in the Last Year: No    Number of Times Moved in the Last Year: 0    Homeless in the Last Year: No  Utilities: Not At Risk (07/13/2024)   Epic    Threatened with loss of utilities: No  Health Literacy: Not on file   Family History  Problem Relation Age of Onset   Deep vein thrombosis Mother    Hypertension Mother    Breast cancer Mother    Hypertension Father    Prostate cancer Father    Colon cancer Father    Breast cancer Sister    CAD Sister    Stroke Maternal Aunt    Prostate cancer Paternal Uncle    Tremor Maternal Grandmother    Lung cancer Other    Glaucoma Neg Hx    Parkinson's disease Neg Hx    Allergies[1]   Positive ROS: All other systems have been reviewed and were otherwise negative with the exception of those mentioned in the HPI and as above.  Physical Exam: General: Alert, no acute distress Cardiovascular: No pedal edema Respiratory: No cyanosis, no use of accessory  musculature Skin: No lesions in the area of chief complaint Neurologic: Sensation intact distally Psychiatric: Patient is competent for consent with normal mood and affect  MUSCULOSKELETAL:  LUE No traumatic wounds, ecchymosis, or rash  No swelling, tenderness, deformity about the left upper arm No elbow or wrist effusion  Sens median, radial, ulnar intact  Motor AIN, PIN, IO intact  Radial pulse 2+, No significant edema  IMAGING: X-rays show a displaced periprosthetic humerus fracture just distal to the humeral stem, well position shoulder arthroplasty components without adverse features  Assessment: Principal Problem:   Shoulder fracture   Left periprosthetic humeral shaft fracture  Plan: X-rays unfortunately show a displaced periprosthetic humeral shaft fracture.  He is to be nonweightbearing in the left upper extremity in a coaptation splint which was applied by the Ortho tech team in the emergency department.  His findings were discussed with Dr. Cristy who will be in touch with him and arrange time for surgery in the next week.  Patient is okay to discharge home if pain controlled and await further information about surgery from Dr. Raguel team.    TORIBIO DELENA HIGASHI, MD  Contact information:   [phone] 7am-5pm epic message Dr. Higashi, or call office for patient follow up: [phone] After hours and holidays please check [email or web address] for group call information for [facility]    [1]  Allergies Allergen Reactions   Lisinopril Cough   Statins Other (See Comments)    Muscle pain

## 2024-08-12 NOTE — ED Notes (Signed)
 Administered pain medication through PICC line. Family is at the bedside. PT is resting comfortably.

## 2024-08-12 NOTE — ED Triage Notes (Signed)
 Pt bib [facility] from home. Pt missed a step and fell 78ft from a ladder. Recent L shoulder replacement on 07/13/24. Pt has L arm deformity. Pain 10/10. No head injury, no LOC, no thinner.   BP 177/97 HR 98 100% RA  RR 20

## 2024-08-12 NOTE — Telephone Encounter (Signed)
 Received the following message from front desk:  Willie Howe LVM to confirm his upcoming appt date. He stated he's had a picc line since 11/25 and has not been seen by a doctor.  He wanted to confirm that was okay or if he needs to be seen sooner.

## 2024-08-13 DIAGNOSIS — E669 Obesity, unspecified: Secondary | ICD-10-CM | POA: Diagnosis present

## 2024-08-13 DIAGNOSIS — Y92009 Unspecified place in unspecified non-institutional (private) residence as the place of occurrence of the external cause: Secondary | ICD-10-CM | POA: Diagnosis not present

## 2024-08-13 DIAGNOSIS — S42332A Displaced oblique fracture of shaft of humerus, left arm, initial encounter for closed fracture: Secondary | ICD-10-CM | POA: Diagnosis present

## 2024-08-13 DIAGNOSIS — I10 Essential (primary) hypertension: Secondary | ICD-10-CM | POA: Diagnosis present

## 2024-08-13 DIAGNOSIS — Z86711 Personal history of pulmonary embolism: Secondary | ICD-10-CM | POA: Diagnosis not present

## 2024-08-13 DIAGNOSIS — Z79899 Other long term (current) drug therapy: Secondary | ICD-10-CM | POA: Diagnosis not present

## 2024-08-13 DIAGNOSIS — Z888 Allergy status to other drugs, medicaments and biological substances status: Secondary | ICD-10-CM | POA: Diagnosis not present

## 2024-08-13 DIAGNOSIS — Z96619 Presence of unspecified artificial shoulder joint: Secondary | ICD-10-CM | POA: Diagnosis not present

## 2024-08-13 DIAGNOSIS — Z7984 Long term (current) use of oral hypoglycemic drugs: Secondary | ICD-10-CM | POA: Diagnosis not present

## 2024-08-13 DIAGNOSIS — S4290XA Fracture of unspecified shoulder girdle, part unspecified, initial encounter for closed fracture: Secondary | ICD-10-CM | POA: Diagnosis present

## 2024-08-13 DIAGNOSIS — Z87891 Personal history of nicotine dependence: Secondary | ICD-10-CM | POA: Diagnosis not present

## 2024-08-13 DIAGNOSIS — Z8249 Family history of ischemic heart disease and other diseases of the circulatory system: Secondary | ICD-10-CM | POA: Diagnosis not present

## 2024-08-13 DIAGNOSIS — Z8546 Personal history of malignant neoplasm of prostate: Secondary | ICD-10-CM | POA: Diagnosis not present

## 2024-08-13 DIAGNOSIS — G2581 Restless legs syndrome: Secondary | ICD-10-CM | POA: Diagnosis present

## 2024-08-13 DIAGNOSIS — T8459XD Infection and inflammatory reaction due to other internal joint prosthesis, subsequent encounter: Secondary | ICD-10-CM | POA: Diagnosis not present

## 2024-08-13 DIAGNOSIS — I2510 Atherosclerotic heart disease of native coronary artery without angina pectoris: Secondary | ICD-10-CM | POA: Diagnosis present

## 2024-08-13 DIAGNOSIS — N400 Enlarged prostate without lower urinary tract symptoms: Secondary | ICD-10-CM | POA: Diagnosis present

## 2024-08-13 DIAGNOSIS — G4730 Sleep apnea, unspecified: Secondary | ICD-10-CM | POA: Diagnosis present

## 2024-08-13 DIAGNOSIS — W109XXA Fall (on) (from) unspecified stairs and steps, initial encounter: Secondary | ICD-10-CM | POA: Diagnosis present

## 2024-08-13 DIAGNOSIS — Z96612 Presence of left artificial shoulder joint: Secondary | ICD-10-CM | POA: Diagnosis present

## 2024-08-13 DIAGNOSIS — S4292XA Fracture of left shoulder girdle, part unspecified, initial encounter for closed fracture: Secondary | ICD-10-CM

## 2024-08-13 DIAGNOSIS — M9732XA Periprosthetic fracture around internal prosthetic left shoulder joint, initial encounter: Secondary | ICD-10-CM | POA: Diagnosis present

## 2024-08-13 DIAGNOSIS — Z7982 Long term (current) use of aspirin: Secondary | ICD-10-CM | POA: Diagnosis not present

## 2024-08-13 DIAGNOSIS — R54 Age-related physical debility: Secondary | ICD-10-CM | POA: Diagnosis present

## 2024-08-13 DIAGNOSIS — G8929 Other chronic pain: Secondary | ICD-10-CM | POA: Diagnosis present

## 2024-08-13 DIAGNOSIS — E119 Type 2 diabetes mellitus without complications: Secondary | ICD-10-CM | POA: Diagnosis present

## 2024-08-13 DIAGNOSIS — M069 Rheumatoid arthritis, unspecified: Secondary | ICD-10-CM | POA: Diagnosis present

## 2024-08-13 DIAGNOSIS — Z86718 Personal history of other venous thrombosis and embolism: Secondary | ICD-10-CM | POA: Diagnosis not present

## 2024-08-13 DIAGNOSIS — F32A Depression, unspecified: Secondary | ICD-10-CM | POA: Diagnosis present

## 2024-08-13 DIAGNOSIS — G20A1 Parkinson's disease without dyskinesia, without mention of fluctuations: Secondary | ICD-10-CM | POA: Diagnosis present

## 2024-08-13 MED ORDER — OXYCODONE HCL 5 MG PO TABS
10.0000 mg | ORAL_TABLET | ORAL | Status: DC | PRN
  Administered 2024-08-13 – 2024-08-16 (×12): 10 mg via ORAL
  Filled 2024-08-13 (×7): qty 2

## 2024-08-13 MED ORDER — ACETAMINOPHEN 500 MG PO TABS
1000.0000 mg | ORAL_TABLET | Freq: Three times a day (TID) | ORAL | Status: DC
  Administered 2024-08-13 – 2024-08-16 (×8): 1000 mg via ORAL
  Filled 2024-08-13 (×7): qty 2

## 2024-08-13 MED ORDER — SODIUM CHLORIDE 0.9% FLUSH
10.0000 mL | INTRAVENOUS | Status: DC | PRN
  Administered 2024-08-13: 10 mL

## 2024-08-13 MED ORDER — SODIUM CHLORIDE 0.9% FLUSH
10.0000 mL | Freq: Two times a day (BID) | INTRAVENOUS | Status: DC
  Administered 2024-08-13 – 2024-08-15 (×4): 10 mL

## 2024-08-13 MED ORDER — OXYCODONE HCL 5 MG PO TABS: 5.0000 mg | ORAL_TABLET | Freq: Four times a day (QID) | ORAL | Status: DC | PRN

## 2024-08-13 MED ORDER — SODIUM CHLORIDE 0.9 % IV SOLN
2.0000 g | Freq: Every day | INTRAVENOUS | Status: DC
  Administered 2024-08-13 – 2024-08-15 (×3): 2 g via INTRAVENOUS
  Filled 2024-08-13 (×3): qty 20

## 2024-08-13 MED ORDER — CHLORHEXIDINE GLUCONATE CLOTH 2 % EX PADS
6.0000 | MEDICATED_PAD | Freq: Every day | CUTANEOUS | Status: DC
  Administered 2024-08-13 – 2024-08-16 (×4): 6 via TOPICAL

## 2024-08-13 MED ORDER — HYDROMORPHONE HCL 1 MG/ML IJ SOLN
0.5000 mg | INTRAMUSCULAR | Status: DC | PRN
  Administered 2024-08-13 – 2024-08-14 (×3): 0.5 mg via INTRAVENOUS
  Filled 2024-08-13 (×3): qty 0.5

## 2024-08-13 NOTE — Evaluation (Signed)
 Occupational Therapy Evaluation Patient Details Name: Willie Howe MRN: [id number] DOB: 1961/08/08 Today's Date: 08/13/2024   History of Present Illness   Willie Howe is a 63 yo male who presented after a fall down 5 steps with immediate L arm pain. Xray showed periprosthetic humeral shaft fracture. PMHx: 11/26 L shoulder arthroplasty revision, DVT, Graves', Parkinson's, rheumatoid arthritis     Clinical Impressions Willie Howe was evaluated s/p the above admission list. He is mod I at baseline. Upon evaluation the pt was limited by LUE pain, LUE NWB, anxiousness with anticipation of pain, activity tolerance and generalized weakness. Overall he was able to adjust himself back into bed however declined further mobility after just getting to the [facility] with the RN. Due to the deficits listed below the pt also needs mod-max A for most ADLs due to pain and LUE precautions. Pt's arm was resting in elbow flexion with edema noted at hand, orthotech notified for sling and to adjust splint. Pt will benefit from continued acute OT services and to follow the Mds therapy recommendations.      If plan is discharge home, recommend the following:   A little help with walking and/or transfers;A lot of help with bathing/dressing/bathroom;Assistance with cooking/housework;Assist for transportation;Help with stairs or ramp for entrance     Functional Status Assessment   Patient has had a recent decline in their functional status and demonstrates the ability to make significant improvements in function in a reasonable and predictable amount of time.     Equipment Recommendations   Tub/shower seat      Precautions/Restrictions   Precautions Precautions: Fall Recall of Precautions/Restrictions: Intact Required Braces or Orthoses: Sling;Splint/Cast Splint/Cast: coapt Splint/Cast - Date Prophylactic Dressing Applied (if applicable): 08/13/24 Restrictions Weight Bearing Restrictions Per Provider  Order: Yes LUE Weight Bearing Per Provider Order: Non weight bearing Other Position/Activity Restrictions: okay for wrist ROM, no ROM at elbow or shoulder.     Mobility Bed Mobility Overal bed mobility: Needs Assistance Bed Mobility: Sit to Supine       Sit to supine: Contact guard assist   General bed mobility comments: incr time for pain    Transfers                   General transfer comment: pt deferred - just getting back to bed wtih RN from [facility]      Balance Overall balance assessment: Needs assistance                                         ADL either performed or assessed with clinical judgement   ADL Overall ADL's : Needs assistance/impaired Eating/Feeding: Set up   Grooming: Set up;Sitting   Upper Body Bathing: Maximal assistance   Lower Body Bathing: Moderate assistance   Upper Body Dressing : Moderate assistance   Lower Body Dressing: Moderate assistance   Toilet Transfer: Contact guard assist   Toileting- Clothing Manipulation and Hygiene: Contact guard assist       Functional mobility during ADLs: Contact guard assist General ADL Comments: limited by pain at arm with any movement; no ROM at shoulder or sling. pt is abel to come out of sling for bathing and dressing     Vision Baseline Vision/History: 1 Wears glasses Vision Assessment?: No apparent visual deficits     Perception Perception: Within Functional Limits  Praxis Praxis: [facility]       Pertinent Vitals/Pain Pain Assessment Pain Assessment: Faces Faces Pain Scale: Hurts whole lot Pain Location: L arm Pain Descriptors / Indicators: Discomfort Pain Intervention(s): Limited activity within patient's tolerance, Monitored during session     Extremity/Trunk Assessment Upper Extremity Assessment Upper Extremity Assessment: LUE deficits/detail LUE Deficits / Details: pt in coapt slint wtih elbow extended and edema at hand. contacted orth[employer] to revise  splint. Sling ordered. wrist and hand ROM is WFL LUE Sensation: WNL LUE Coordination: decreased gross motor   Lower Extremity Assessment Lower Extremity Assessment: Defer to PT evaluation   Cervical / Trunk Assessment Cervical / Trunk Assessment: Normal   Communication Communication Communication: No apparent difficulties   Cognition Arousal: Alert Behavior During Therapy: WFL for tasks assessed/performed, Anxious Cognition: No apparent impairments                               Following commands: Intact       Cueing  General Comments   Cueing Techniques: Verbal cues  VSS - sling ordered. [employer] contacted to adjust splint           Home Living Family/patient expects to be discharged to:: Private residence Living Arrangements: Spouse/significant other Available Help at Discharge: Family Type of Home: House Home Access: Stairs to enter Secretary/administrator of Steps: 4-5 Entrance Stairs-Rails: Left Home Layout: Other (Comment)     Bathroom Shower/Tub: Chief Strategy Officer: Handicapped height     Home Equipment: Cane - single point          Prior Functioning/Environment Prior Level of Function : Independent/Modified Independent             Mobility Comments: intermittent use of SPC ADLs Comments: mod I most ADLs, wife assisting as needed since surgery    OT Problem List: Decreased strength;Decreased range of motion;Decreased activity tolerance;Impaired balance (sitting and/or standing);Decreased safety awareness;Decreased knowledge of use of DME or AE;Decreased knowledge of precautions   OT Treatment/Interventions: Self-care/ADL training;Therapeutic exercise;DME and/or AE instruction;Therapeutic activities;Patient/family education;Balance training      OT Goals(Current goals can be found in the care plan section)   Acute Rehab OT Goals Patient Stated Goal: less pain OT Goal Formulation: With patient Time For Goal  Achievement: 08/27/24 Potential to Achieve Goals: Good ADL Goals Pt Will Perform Upper Body Dressing: with min assist Pt Will Perform Lower Body Dressing: with min assist Pt Will Transfer to Toilet: with modified independence   OT Frequency:  Min 3X/week       AM-PAC OT 6 Clicks Daily Activity     Outcome Measure Help from another person eating meals?: A Little Help from another person taking care of personal grooming?: A Little Help from another person toileting, which includes using toliet, bedpan, or urinal?: A Little Help from another person bathing (including washing, rinsing, drying)?: A Lot Help from another person to put on and taking off regular upper body clothing?: A Lot Help from another person to put on and taking off regular lower body clothing?: A Lot 6 Click Score: 15   End of Session Nurse Communication: Mobility status  Activity Tolerance: Patient limited by pain Patient left: in bed;with call bell/phone within reach;with bed alarm set  OT Visit Diagnosis: Muscle weakness (generalized) (M62.81);History of falling (Z91.81);Pain                Time: 1310-1330 OT Time Calculation (min): 20 min  Charges:  OT General Charges $OT Visit: 1 Visit OT Evaluation $OT Eval Moderate Complexity: 1 Mod  Lucie Kendall, OTR/L Acute Rehabilitation Services Office [phone] Secure Chat Communication Preferred   Lucie JONETTA Kendall 08/13/2024, 3:06 PM

## 2024-08-13 NOTE — Progress Notes (Signed)
 " PROGRESS NOTE    Willie Howe  FMW:[id number] DOB: [birth year] DOA: 08/12/2024 PCP: Leila Lucie LABOR, MD  Subjective: No acute events since admission . Seen and examined at bedside. Reports having ongoing pain at L shoulder fracture site. Tolerating oral intake without n/v. Denies constipation.   Hospital Course:  As per H&P: 63 y.o. male with medical history significant of DVT, Graves', Parkinson's, rheumatoid arthritis who presented after a fall.  Patient fell down steps and landed on his left arm.  Willie Howe developed severe pain so presented to the ER for further assessment.  On arrival Willie Howe was afebrile and hemodynamically stable.  Labs were obtained which showed WBC 9.7, hemoglobin 12.3, BMP unrevealing.  X-ray of chest showed no acute abnormalities.  X-ray of humerus showed acute humeral shaft fracture.  Patient was admitted further workup.  Orthopedics was consulted.  Due to persistent pain patient was admitted for pain control.    Assessment and Plan:  Acute on chronic pain Left shoulder fracture - Patient presented after a fall found to have a periprosthetic humeral fracture -Orthopedics consulted with plans for outpatient surgery - hold and discontinue home norco PRN - start acetaminophen  1000mg  TID - cont home gabapentin  100mg  TID - cont home oxycodone  5mg  PRN for moderate pain - start oxycodone  10mg  PRN or hydromorphone  0.5mg  IV PRN for severe pain  - stop standalone IV hydromorphone  0.5mg  IV PRN - hold and discontinue home docusate - start senna BID, miralax  daily PRN to avoid opioid induced constipation   Hx of L shoulder septic arthritis Continue p.o. Doxy and IV ceftriaxone  which are scheduled for 6 weeks until January 7 FU outpatient with ortho outpatient   Hypertension-continue amlodipine    RLS-continue Requip    BPH-continue Flomax    CAD-continue aspirin   DVT prophylaxis: SCDs Start: 08/12/24 2106  SCDs   Code Status: Full Code Family Communication: updated  wife at bedside Disposition Plan: TBD Reason for continuing need for hospitalization: monitor for severe pain control as transitioning from IV to PO options  Objective: Vitals:   08/12/24 2214 08/12/24 2255 08/13/24 0405 08/13/24 0713  BP:  (!) 168/84 113/69 93/60  Pulse: (!) 103 (!) 102 (!) 103 98  Resp: 20 18 18 16   Temp:  98.3 F (36.8 C) 98.3 F (36.8 C) 98.4 F (36.9 C)  TempSrc:   Oral   SpO2: 99% 98% 94% 94%    Intake/Output Summary (Last 24 hours) at 08/13/2024 1146 Last data filed at 08/13/2024 0700 Gross per 24 hour  Intake 149.4 ml  Output --  Net 149.4 ml   There were no vitals filed for this visit.  Examination:  Physical Exam Vitals and nursing note reviewed.  Constitutional:      General: Willie Howe is not in acute distress.    Appearance: Willie Howe is obese. Willie Howe is ill-appearing.     Comments: frail  HENT:     Head: Normocephalic and atraumatic.  Cardiovascular:     Rate and Rhythm: Normal rate and regular rhythm.     Pulses: Normal pulses.     Heart sounds: Normal heart sounds.  Pulmonary:     Effort: Pulmonary effort is normal.     Breath sounds: Normal breath sounds.  Abdominal:     General: Bowel sounds are normal.     Palpations: Abdomen is soft.  Musculoskeletal:     Comments: L shoulder with bandage clean/dry/intact  Neurological:     Mental Status: Willie Howe is alert. Mental status is at baseline.  Data Reviewed: I have personally reviewed following labs and imaging studies  CBC: Recent Labs  Lab 08/12/24 1936  WBC 9.7  NEUTROABS 6.3  HGB 12.3*  HCT 37.7*  MCV 91.3  PLT 269   Basic Metabolic Panel: Recent Labs  Lab 08/12/24 1936  NA 137  K 4.0  CL 104  CO2 23  GLUCOSE 112*  BUN 13  CREATININE 0.67  CALCIUM 9.3   GFR: CrCl cannot be calculated (Unknown ideal weight.). Liver Function Tests: No results for input(s): AST, ALT, ALKPHOS, BILITOT, PROT, ALBUMIN  in the last 168 hours. No results for input(s): LIPASE,  AMYLASE in the last 168 hours. No results for input(s): AMMONIA in the last 168 hours. Coagulation Profile: No results for input(s): INR, PROTIME in the last 168 hours. Cardiac Enzymes: No results for input(s): CKTOTAL, CKMB, CKMBINDEX, TROPONINI in the last 168 hours. ProBNP, BNP (last 5 results) No results for input(s): PROBNP, BNP in the last 8760 hours. HbA1C: No results for input(s): HGBA1C in the last 72 hours. CBG: Recent Labs  Lab 08/12/24 1727  GLUCAP 120*   Lipid Profile: No results for input(s): CHOL, HDL, LDLCALC, TRIG, CHOLHDL, LDLDIRECT in the last 72 hours. Thyroid  Function Tests: No results for input(s): TSH, T4TOTAL, FREET4, T3FREE, THYROIDAB in the last 72 hours. Anemia Panel: No results for input(s): VITAMINB12, FOLATE, FERRITIN, TIBC, IRON, RETICCTPCT in the last 72 hours. Sepsis Labs: No results for input(s): PROCALCITON, LATICACIDVEN in the last 168 hours.  No results found for this or any previous visit (from the past 240 hours).   Radiology Studies: DG Chest Portable 1 View Result Date: 08/12/2024 EXAM: 1 VIEW(S) XRAY OF THE CHEST 08/12/2024 05:29:00 PM COMPARISON: CXR 11/27/2019, CT Chest 11/28/2019. CLINICAL HISTORY: fall FINDINGS: LINES, TUBES AND DEVICES: Right upper extremity PICC in place with tip just proximal to the superior cavoatrial junction. LUNGS AND PLEURA: No focal pulmonary opacity. No pleural effusion. No pneumothorax. HEART AND MEDIASTINUM: No acute abnormality of the cardiac and mediastinal silhouettes. BONES AND SOFT TISSUES: Status post left reverse total arthroplasty. No acute osseous abnormality. IMPRESSION: 1. No acute cardiopulmonary abnormality. 2. Right upper extremity PICC tip just proximal to the superior cavoatrial junction. Electronically signed by: Morgane Naveau MD 08/12/2024 06:08 PM EST RP Workstation: [id number]   DG Humerus Left Result Date: 08/12/2024 EXAM: 1  VIEW(S) XRAY OF THE LEFT HUMERUS 08/12/2024 05:29:00 PM COMPARISON: xr left shoulder 07/13/24 CLINICAL HISTORY: fall/l shoulder deformity FINDINGS: BONES AND JOINTS: Intact left reverse shoulder arthroplasty hardware. Acute, greater than half shaft width displaced, left mid humeral shaft periprosthetic fracture. SOFT TISSUES: The soft tissues are unremarkable. IMPRESSION: 1. Acute, greater than half shaft width displaced, left mid humeral shaft periprosthetic fracture. 2. Intact left reverse shoulder arthroplasty hardware. Electronically signed by: Morgane Naveau MD 08/12/2024 06:07 PM EST RP Workstation: [id number]   DG Shoulder Left Result Date: 08/12/2024 EXAM: 1 VIEW(S) XRAY OF THE LEFT SHOULDER 08/12/2024 05:29:00 PM COMPARISON: X-ray left shoulder 07/13/2024. CLINICAL HISTORY: fall/l shoulder deformity FINDINGS: BONES AND JOINTS: Left shoulder reverse arthroplasty noted. Intact hardware. Acute displaced left mid humeral shaft periprosthetic fracture. The [facility] joint is unremarkable. SOFT TISSUES: No abnormal calcifications. Visualized lung is unremarkable. IMPRESSION: 1. Acute displaced left mid humeral shaft periprosthetic fracture. 2. Left shoulder reverse arthroplasty with intact hardware. Electronically signed by: Morgane Naveau MD 08/12/2024 06:05 PM EST RP Workstation: [id number]    Scheduled Meds:  amLODipine   5 mg Oral Daily   aspirin   81 mg Oral BID   Chlorhexidine   Gluconate Cloth  6 each Topical Daily   doxycycline   100 mg Oral BID   folic acid   1 mg Oral Daily   gabapentin   100 mg Oral TID   rOPINIRole   1.5 mg Oral TID   sodium chloride  flush  10-40 mL Intracatheter Q12H   tamsulosin   0.4 mg Oral QHS   Continuous Infusions:  cefTRIAXone  (ROCEPHIN )  IV 2 g (08/13/24 0551)     LOS: 0 days   Norval Bar, MD  [facility]  08/13/2024, 11:46 AM   "

## 2024-08-13 NOTE — H&P (Signed)
 " History and Physical    Willie Howe FMW:[id number] DOB: 04-May-1961 DOA: 08/12/2024  PCP: Leila Lucie LABOR, MD   Chief Complaint:  shoulder injury  HPI: Willie Howe is a 63 y.o. male with medical history significant of DVT, Graves', Parkinson's, rheumatoid arthritis who presented after a fall.  Patient fell down steps and landed on his left arm.  He developed severe pain so presented to the ER for further assessment.  On arrival he was afebrile and hemodynamically stable.  Labs were obtained which showed WBC 9.7, hemoglobin 12.3, BMP unrevealing.  X-ray of chest showed no acute abnormalities.  X-ray of humerus showed acute humeral shaft fracture.  Patient was admitted further workup.  Orthopedics was consulted.  Due to persistent pain patient was admitted for pain control.   Review of Systems: Review of Systems  All other systems reviewed and are negative.    As per HPI otherwise 10 point review of systems negative.   Allergies[1]  Past Medical History:  Diagnosis Date   Arthritis    BPPV (benign paroxysmal positional vertigo)    Bradycardia    Cataract    DVT (deep venous thrombosis) (HCC)    Elevated prostate specific antigen (PSA)    Graves disease 1996   Graves disease    hx of   History of kidney stones    History of pulmonary embolism    History of substance use disorder    Hypertension    Infection of prosthetic joint    Kidney stones    Obesity    Parkinson disease (HCC)    Prostate cancer (HCC)    June 2025   Rheumatoid arthritis (HCC)    Managed by Dr. Jon Jacob (Rheumatology)   Sleep apnea    Intolerant of CPAP   Type 2 diabetes mellitus (HCC)     Past Surgical History:  Procedure Laterality Date   BACK SURGERY  2015   COLONOSCOPY     EXAM UNDER ANESTHESIA WITH MANIPULATION OF KNEE Right 04/18/2015   Procedure: MANIPULATION OF RIGHT KNEE UNDER ANESTHESIA;  Surgeon: Taft FORBES Minerva, MD;  Location: [facility];  Service: Orthopedics;   Laterality: Right;   IVC Filter     removed December 2016   IVC FILTER REMOVAL  04/24/2015   KIDNEY STONE SURGERY  2010   KNEE ARTHROSCOPY Right 2006   meniscus    KNEE ARTHROSCOPY WITH MEDIAL MENISECTOMY Left 04/24/2016   Procedure: LEFT KNEE ARTHROSCOPY WITH PARTIAL MEDIAL MENISECTOMY;  Surgeon: Taft FORBES Minerva, MD;  Location: [facility];  Service: Orthopedics;  Laterality: Left;   KNEE JOINT MANIPULATION Right 04/18/2015   KNEE SURGERY     x 2   KNEE SURGERY Left 1976   ?ligament repair    LEFT HEART CATH AND CORONARY ANGIOGRAPHY N/A 11/29/2019   Procedure: LEFT HEART CATH AND CORONARY ANGIOGRAPHY;  Surgeon: Jordan, Peter M, MD;  Location: [facility] IN[facility]ASIVE CV LAB;  Service: Cardiovascular;  Laterality: N/A;   LITHOTRIPSY     LUMBAR WOUND DEBRIDEMENT N/A 02/23/2014   Procedure: LUMBAR WOUND DEBRIDEMENT;  Surgeon: Darina MALVA Boehringer, MD;  Location: [facility];  Service: Neurosurgery;  Laterality: N/A;  exploration lumbar wound. repair of dural defect.   MENISECTOMY Right    open medial    NEPHROLITHOTOMY     PERIPHERAL VASCULAR CATHETERIZATION N/A 01/30/2015   Procedure: IVC Filter Insertion;  Surgeon: Gaile LELON New, MD;  Location: [facility]VASIVE CV LAB;  Service: Cardiovascular;  Laterality: N/A;  PERIPHERAL VASCULAR CATHETERIZATION N/A 04/24/2015   Procedure: IVC Filter Removal;  Surgeon: Gaile LELON New, MD;  Location: [facility]VAS[facility] CV [facility];  Service: Cardiovascular;  Laterality: N/A;   PERIPHERAL VASCULAR CATHETERIZATION N/A 08/14/2015   Procedure: IVC Filter Removal;  Surgeon: Gaile LELON New, MD;  Location: [facility];  Service: Cardiovascular;  Laterality: N/A;   REVISION TOTAL SHOULDER TO REVERSE TOTAL SHOULDER Left 09/11/2021   Procedure: REVISION TOTAL SHOULDER TO REVERSE TOTAL SHOULDER;  Surgeon: Cristy Bonner DASEN, MD;  Location: [facility];  Service: Orthopedics;  Laterality: Left;   REVISION TOTAL SHOULDER TO REVERSE TOTAL SHOULDER Left 07/13/2024   Procedure: REVISION, REVERSE TOTAL  ARTHROPLASTY, SHOULDER;  Surgeon: Cristy Bonner DASEN, MD;  Location: [facility];  Service: Orthopedics;  Laterality: Left;  revision total shoulder arthroplasty, synovectomy   SHOULDER ARTHROSCOPY WITH OPEN ROTATOR CUFF REPAIR Left 11/10/2019   Procedure: SHOULDER ARTHROSCOPY WITH OPEN ROTATOR CUFF REPAIR;  Surgeon: Margrette Taft BRAVO, MD;  Location: [facility];  Service: Orthopedics;  Laterality: Left;  pt tested + on 2/23, does not need Covid test < 90 days   SHOULDER ARTHROSCOPY WITH ROTATOR CUFF REPAIR AND OPEN BICEPS TENODESIS Left 02/15/2019   Procedure: SHOULDER ARTHROSCOPY WITH open ROTATOR CUFF REPAIR AND OPEN BICEPS TENODESIS;  Surgeon: Margrette Taft BRAVO, MD;  Location: [facility];  Service: Orthopedics;  Laterality: Left;   SPINE SURGERY  2015   Dr Carles Fusion    SPINE SURGERY  1999   discectomy- lumbar   SYNOVIAL BIOPSY Left 07/13/2024   Procedure: BIOPSY, SYNOVIUM;  Surgeon: Cristy Bonner DASEN, MD;  Location: [facility];  Service: Orthopedics;  Laterality: Left;   TOTAL KNEE ARTHROPLASTY Right 02/02/2015   Procedure:  RIGHT TOTAL KNEE ARTHROPLASTY;  Surgeon: Taft BRAVO Margrette, MD;  Location: [facility];  Service: Orthopedics;  Laterality: Right;  LM with pt's daughter of new arrival time (10:45)     TOTAL KNEE ARTHROPLASTY Left 07/24/2016   Procedure: TOTAL KNEE ARTHROPLASTY;  Surgeon: Taft BRAVO Margrette, MD;  Location: [facility];  Service: Orthopedics;  Laterality: Left;   TOTAL SHOULDER ARTHROPLASTY Left 11/08/2020   Procedure: TOTAL SHOULDER ARTHROPLASTY;  Surgeon: Onesimo Oneil LABOR, MD;  Location: [facility];  Service: Orthopedics;  Laterality: Left;  Left shoulder Reverse Arthroplasty   ureteroscopic stone manipulation Left 08/08/2021     reports that he quit smoking about 29 years ago. His smoking use included cigarettes. He started smoking about 49 years ago. He has a 30 pack-year smoking history. He has never used smokeless tobacco. He reports that he does not drink alcohol  and does not use drugs.  Family  History  Problem Relation Age of Onset   Deep vein thrombosis Mother    Hypertension Mother    Breast cancer Mother    Hypertension Father    Prostate cancer Father    Colon cancer Father    Breast cancer Sister    CAD Sister    Stroke Maternal Aunt    Prostate cancer Paternal Uncle    Tremor Maternal Grandmother    Lung cancer Other    Glaucoma Neg Hx    Parkinson's disease Neg Hx     Prior to Admission medications  Medication Sig Start Date End Date Taking? Authorizing Provider  amLODipine  (NORVASC ) 10 MG tablet Take 5 mg by mouth daily. 09/24/23  Yes [provider]  aspirin  (ASPIRIN  CHILDRENS) 81 MG chewable tablet Chew 1 tablet (81 mg total) by mouth 2 (two) times daily. For 6 weeks for  DVT prophylaxis after surgery Patient taking differently: Chew 81 mg by mouth daily. For 6 weeks for DVT prophylaxis after surgery 07/15/24 08/26/24 Yes McBane, Aleck SAILOR, PA-C  cefTRIAXone  (ROCEPHIN ) IVPB Inject 2 g into the vein daily. Indication:  prosthetic joint infection First Dose: No Last Day of Therapy:  08/24/2024 Labs - Once weekly:  CBC/D and BMP, Labs - Once weekly: ESR and CRP Method of administration: IV Push Method of administration may be changed at the discretion of home infusion pharmacist based upon assessment of the patient and/or caregiver's ability to self-administer the medication ordered. 07/14/24 08/25/24 Yes Vu, Constance DASEN, MD  celecoxib  (CELEBREX ) 100 MG capsule Take 1 capsule (100 mg total) by mouth 2 (two) times daily. For 2 weeks. Then take as needed Patient taking differently: Take 100 mg by mouth 2 (two) times daily as needed. For 2 weeks. Then take as needed 07/15/24 08/14/24 Yes McBane, Caroline N, PA-C  docusate sodium  (COLACE) 100 MG capsule Take 200 mg by mouth every other day.   Yes [provider]  doxycycline  (VIBRAMYCIN ) 100 MG capsule Take 1 capsule (100 mg total) by mouth 2 (two) times daily. 07/15/24 08/25/24 Yes Cristy Bonner DASEN, MD  folic acid   (FOLVITE ) 1 MG tablet Take 1 mg by mouth daily.   Yes [provider]  gabapentin  (NEURONTIN ) 100 MG capsule Take 1 capsule (100 mg total) by mouth 3 (three) times daily. For pain Patient taking differently: Take 100 mg by mouth at bedtime as needed. For pain 07/15/24  Yes McBane, Caroline N, PA-C  HYDROcodone -acetaminophen  (NORCO) 7.5-325 MG tablet Take 1 tablet by mouth every 6 (six) hours as needed for moderate pain (pain score 4-6).   Yes [provider]  loratadine  (CLARITIN ) 10 MG tablet Take 10 mg by mouth daily.   Yes [provider]  metFORMIN  (GLUCOPHAGE -XR) 500 MG 24 hr tablet Take 500 mg by mouth 2 (two) times daily. 06/13/24  Yes [provider]  oxyCODONE  (OXY IR/ROXICODONE ) 5 MG immediate release tablet Take 5 mg by mouth every 6 (six) hours as needed for severe pain (pain score 7-10).   Yes [provider]  rOPINIRole  (REQUIP ) 1 MG tablet Take 1.5 tablets (1.5 mg total) by mouth 3 (three) times daily. 12/30/23  Yes McCue, Harlene, NP  tamsulosin  (FLOMAX ) 0.4 MG CAPS capsule Take 0.4 mg by mouth at bedtime.    Yes [provider]  hydrOXYzine  (ATARAX ) 25 MG tablet Take 1 tablet (25 mg total) by mouth every 8 (eight) hours as needed for up to 20 doses for itching. Patient not taking: Reported on 07/04/2024 12/05/22   Ethyl Richerd BROCKS, MD  [Paused] inFLIXimab -abda (RENFLEXIS  IV) Inject 1 Dose into the vein every 6 (six) weeks. Wait to take this until your doctor or other care provider tells you to start again.    [provider]  [Paused] Methotrexate  Sodium (METHOTREXATE , PF,) 50 MG/2ML injection Inject 0.8 mLs into the muscle once a week. Wait to take this until your doctor or other care provider tells you to start again. 06/18/19   [provider]  [Paused] predniSONE  (DELTASONE ) 5 MG tablet Take 5 mg by mouth daily. Wait to take this until your doctor or other care provider tells you to start again.    [provider]    Physical Exam: Vitals:   08/12/24 2041 08/12/24 2214 08/12/24 2255 08/13/24 0405  BP:   (!) 168/84 113/69  Pulse:  (!) 103 (!) 102 (!) 103  Resp:  20 18 18   Temp: 98.5 F (36.9 C)  98.3 F (36.8 C) 98.3 F (36.8 C)  TempSrc: Oral   Oral  SpO2:  99% 98% 94%   Physical Exam Vitals reviewed.  Constitutional:      Appearance: He is normal weight.  HENT:     Head: Normocephalic.     Nose: Nose normal.     Mouth/Throat:     Mouth: Mucous membranes are moist.     Pharynx: Oropharynx is clear.  Eyes:     Conjunctiva/sclera: Conjunctivae normal.     Pupils: Pupils are equal, round, and reactive to light.  Cardiovascular:     Rate and Rhythm: Normal rate.     Pulses: Normal pulses.     Heart sounds: Normal heart sounds.  Pulmonary:     Effort: Pulmonary effort is normal.  Abdominal:     General: Abdomen is flat. Bowel sounds are normal.  Musculoskeletal:        General: Swelling and deformity present. Normal range of motion.     Cervical back: Normal range of motion.  Skin:    General: Skin is warm.  Neurological:     General: No focal deficit present.     Mental Status: He is alert. Mental status is at baseline.  Psychiatric:        Mood and Affect: Mood normal.        Labs on Admission: I have personally reviewed the patients's labs and imaging studies.  Assessment/Plan Principal Problem:   Shoulder fracture   # Left shoulder fracture #Hx of septic joint - Patient presented after a fall found to have a periprosthetic humeral fracture -Orthopedics consulted with plans for outpatient surgery - Patient had refractory pain requiring IV analgesia  Plan: IV dilaudid  prn Continue p.o. Doxy and IV ceftriaxone  which are scheduled for 6 weeks until January 7 FU outpatient with ortho  # Hypertension-continue amlodipine   # RLS-continue Requip   # BPH-continue Flomax   # CAD-continue aspirin   # Chronic pain-continue gabapentin    Admission  status: Observation Med-Surg  Certification: The appropriate patient status for this patient is OBSERVATION. Observation status is judged to be reasonable and necessary in order to provide the required intensity of service to ensure the patient's safety. The patient's presenting symptoms, physical exam findings, and initial radiographic and laboratory data in the context of their medical condition is felt to place them at decreased risk for further clinical deterioration. Furthermore, it is anticipated that the patient will be medically stable for discharge from the hospital within 2 midnights of admission.     Lamar Dess MD [facility] If 7PM-7AM, please contact night-coverage [email or web address]  08/13/2024, 4:48 AM        [1]  Allergies Allergen Reactions   Lisinopril Cough   Statins Other (See Comments)    Muscle pain   "

## 2024-08-13 NOTE — Progress Notes (Signed)
 Orthopedic Tech Progress Note Patient Details:  Willie Howe 18-Feb-1961 [id number]  Ortho Devices Type of Ortho Device: Coapt, Arm sling Ortho Device/Splint Location: LUE Ortho Device/Splint Interventions: Application   Post Interventions Patient Tolerated: Well Instructions Provided: Care of device, Adjustment of device  [name] E [name] 08/13/2024, 2:01 PM

## 2024-08-13 NOTE — Evaluation (Signed)
 Physical Therapy Evaluation Patient Details Name: Willie Howe MRN: [id number] DOB: March 02, 1961 Today's Date: 08/13/2024  History of Present Illness  Willie Howe is a 63 yo male who presented after a fall down 5 steps of ladder with immediate L arm pain. Xray showed periprosthetic humeral shaft fracture. PMHx: 11/26 L shoulder arthroplasty revision, DVT, Graves', Parkinson's, rheumatoid arthritis   Clinical Impression  Pt admitted with above. Pt able to get up and amb with contact guard and increased time. Pt preferred to use IV pole for ambulation. Anticipate pt to be able to return home once cleared by MD. Acute PT to monitor patient while in hospital and progress to amb without AD and complete stair negotiation. I do not anticipate pt to need follow up PT post d/c. Aware pt will be having L UE surgery most likely at outpt and therapy recommendations to follow MD orders.      If plan is discharge home, recommend the following: A little help with walking and/or transfers;A little help with bathing/dressing/bathroom;Help with stairs or ramp for entrance   Can travel by private vehicle        Equipment Recommendations None recommended by PT  Recommendations for Other Services       Functional Status Assessment Patient has had a recent decline in their functional status and demonstrates the ability to make significant improvements in function in a reasonable and predictable amount of time.     Precautions / Restrictions Precautions Precautions: Fall Recall of Precautions/Restrictions: Intact Required Braces or Orthoses: Sling;Splint/Cast Splint/Cast: coapt Splint/Cast - Date Prophylactic Dressing Applied (if applicable): 08/13/24 Restrictions Weight Bearing Restrictions Per Provider Order: Yes LUE Weight Bearing Per Provider Order: Non weight bearing Other Position/Activity Restrictions: okay for wrist ROM, no ROM at elbow or shoulder.      Mobility  Bed Mobility Overal  bed mobility: Needs Assistance Bed Mobility: Sit to Supine, Supine to Sit     Supine to sit: Contact guard Sit to supine: Contact guard assist   General bed mobility comments: incr time for pain    Transfers Overall transfer level: Needs assistance Equipment used: None Transfers: Sit to/from Stand Sit to Stand: Contact guard assist           General transfer comment: increased time, wide base of support, bed elevated due to pt with height of 6'4    Ambulation/Gait Ambulation/Gait assistance: Contact guard assist Gait Distance (Feet): 150 Feet Assistive device: IV Pole Gait Pattern/deviations: Step-through pattern Gait velocity: guarded     General Gait Details: guarded due to anxiety over worsening L shoulder pain  Stairs            Wheelchair Mobility     Tilt Bed    Modified Rankin (Stroke Patients Only)       Balance Overall balance assessment: Needs assistance Sitting-balance support: No upper extremity supported, Feet supported Sitting balance-Leahy Scale: Good     Standing balance support: Single extremity supported, During functional activity Standing balance-Leahy Scale: Fair Standing balance comment: prefers R UE support at this time due to L UE pain                             Pertinent Vitals/Pain Pain Assessment Pain Assessment: Faces Faces Pain Scale: Hurts whole lot Pain Location: L arm Pain Descriptors / Indicators: Discomfort Pain Intervention(s): Limited activity within patient's tolerance    Home Living Family/patient expects to be discharged to:: Private  residence Living Arrangements: Spouse/significant other Available Help at Discharge: Family Type of Home: House Home Access: Stairs to enter Entrance Stairs-Rails: Left Entrance Stairs-Number of Steps: 4-5   Home Layout: Other (Comment) Home Equipment: Cane - single point      Prior Function Prior Level of Function : Independent/Modified Independent              Mobility Comments: intermittent use of SPC ADLs Comments: mod I most ADLs, wife assisting as needed since surgery     Extremity/Trunk Assessment   Upper Extremity Assessment Upper Extremity Assessment: LUE deficits/detail LUE Deficits / Details: pt in coapt slint wtih elbow extended and edema at hand. contacted [employer] to revise splint. Sling ordered. wrist and hand ROM is WFL LUE Sensation: WNL LUE Coordination: decreased gross motor    Lower Extremity Assessment Lower Extremity Assessment: Overall WFL for tasks assessed    Cervical / Trunk Assessment Cervical / Trunk Assessment: Normal  Communication   Communication Communication: No apparent difficulties    Cognition Arousal: Alert Behavior During Therapy: WFL for tasks assessed/performed, Anxious   PT - Cognitive impairments: No apparent impairments                         Following commands: Intact       Cueing Cueing Techniques: Verbal cues     General Comments General comments (skin integrity, edema, etc.): VSS    Exercises     Assessment/Plan    PT Assessment Patient needs continued PT services  PT Problem List Decreased strength;Decreased activity tolerance;Decreased mobility       PT Treatment Interventions DME instruction;Gait training;Stair training;Functional mobility training;Therapeutic activities;Therapeutic exercise    PT Goals (Current goals can be found in the Care Plan section)  Acute Rehab PT Goals Patient Stated Goal: better pain management PT Goal Formulation: With patient Time For Goal Achievement: 08/27/24 Potential to Achieve Goals: Good    Frequency Min 2X/week     Co-evaluation               AM-PAC PT 6 Clicks Mobility  Outcome Measure Help needed turning from your back to your side while in a flat bed without using bedrails?: None Help needed moving from lying on your back to sitting on the side of a flat bed without using bedrails?:  None Help needed moving to and from a bed to a chair (including a wheelchair)?: None Help needed standing up from a chair using your arms (e.g., wheelchair or bedside chair)?: A Little Help needed to walk in hospital room?: A Little Help needed climbing 3-5 steps with a railing? : A Little 6 Click Score: 21    End of Session   Activity Tolerance: Patient tolerated treatment well Patient left: in bed;with call bell/phone within reach;with family/visitor present Nurse Communication: Mobility status PT Visit Diagnosis: Unsteadiness on feet (R26.81)    Time: 1411-1430 PT Time Calculation (min) (ACUTE ONLY): 19 min   Charges:   PT Evaluation $PT Eval Low Complexity: 1 Low   PT General Charges $$ ACUTE PT VISIT: 1 Visit         Norene Ames, PT, DPT Acute Rehabilitation Services Secure chat preferred Office #: [phone]   Norene CHRISTELLA Ames 08/13/2024, 3:54 PM

## 2024-08-13 NOTE — Plan of Care (Signed)
  Problem: Activity: Goal: Risk for activity intolerance will decrease Outcome: Progressing   Problem: Pain Managment: Goal: General experience of comfort will improve and/or be controlled Outcome: Progressing   Problem: Safety: Goal: Ability to remain free from injury will improve Outcome: Progressing   Problem: Skin Integrity: Goal: Risk for impaired skin integrity will decrease Outcome: Progressing

## 2024-08-14 MED ORDER — HEPARIN SOD (PORK) LOCK FLUSH 100 UNIT/ML IV SOLN
250.0000 [IU] | INTRAVENOUS | Status: AC | PRN
  Administered 2024-08-14: 250 [IU]

## 2024-08-14 MED ORDER — ACETAMINOPHEN 500 MG PO TABS: 1000.0000 mg | ORAL_TABLET | Freq: Three times a day (TID) | ORAL | 0 refills | Status: DC

## 2024-08-14 NOTE — Discharge Instructions (Addendum)
 Follow up with PCP within one week Follow up with infectious disease clinic as scheduled on 09/05/24 Follow up with orthopedic surgery as scheduled on 08/17/24

## 2024-08-14 NOTE — Plan of Care (Signed)
   Problem: Activity: Goal: Risk for activity intolerance will decrease Outcome: Progressing   Problem: Elimination: Goal: Will not experience complications related to bowel motility Outcome: Progressing   Problem: Safety: Goal: Ability to remain free from injury will improve Outcome: Progressing   Problem: Skin Integrity: Goal: Risk for impaired skin integrity will decrease Outcome: Progressing

## 2024-08-14 NOTE — Plan of Care (Signed)
  Problem: Education: Goal: Knowledge of General Education information will improve Description: Including pain rating scale, medication(s)/side effects and non-pharmacologic comfort measures Outcome: Not Progressing   Problem: Health Behavior/Discharge Planning: Goal: Ability to manage health-related needs will improve Outcome: Not Progressing   Problem: Clinical Measurements: Goal: Ability to maintain clinical measurements within normal limits will improve Outcome: Not Progressing Goal: Will remain free from infection Outcome: Not Progressing Goal: Diagnostic test results will improve Outcome: Not Progressing Goal: Respiratory complications will improve Outcome: Not Progressing Goal: Cardiovascular complication will be avoided Outcome: Not Progressing   Problem: Activity: Goal: Risk for activity intolerance will decrease Outcome: Not Progressing   Problem: Nutrition: Goal: Adequate nutrition will be maintained Outcome: Not Progressing   Problem: Coping: Goal: Level of anxiety will decrease Outcome: Not Progressing   Problem: Pain Managment: Goal: General experience of comfort will improve and/or be controlled Outcome: Not Progressing   Problem: Safety: Goal: Ability to remain free from injury will improve Outcome: Not Progressing   Problem: Skin Integrity: Goal: Risk for impaired skin integrity will decrease Outcome: Not Progressing

## 2024-08-14 NOTE — Progress Notes (Signed)
 Occupational Therapy Treatment Patient Details Name: Willie Howe MRN: [id number] DOB: Jan 23, 1961 Today's Date: 08/14/2024   History of present illness Willie Howe is a 63 yo male who presented after a fall down 5 steps of ladder with immediate L arm pain. Xray showed periprosthetic humeral shaft fracture. PMHx: 11/26 L shoulder arthroplasty revision, DVT, Graves', Parkinson's, rheumatoid arthritis   OT comments  Pt today making limited progress towards OT goals due to pain. Pt is able to demonstrate hand and wrist movement. With any movement he reports pain jumps up to a 9 including bed mobility, self-feeding. While OT was in the room we did get the news that he will be having sx tomorrow to repair the fx. OT continued education on edema management strategies and deferred more mobility due to pain until tomorrow post-op.       If plan is discharge home, recommend the following:  A little help with walking and/or transfers;A lot of help with bathing/dressing/bathroom;Assistance with cooking/housework;Assist for transportation;Help with stairs or ramp for entrance   Equipment Recommendations  Tub/shower seat    Recommendations for Other Services      Precautions / Restrictions Precautions Precautions: Fall Recall of Precautions/Restrictions: Intact Required Braces or Orthoses: Sling;Splint/Cast Splint/Cast: coapt Splint/Cast - Date Prophylactic Dressing Applied (if applicable): 08/13/24 Restrictions Weight Bearing Restrictions Per Provider Order: Yes LUE Weight Bearing Per Provider Order: Non weight bearing Other Position/Activity Restrictions: okay for wrist ROM, no ROM at elbow or shoulder.       Mobility Bed Mobility               General bed mobility comments: incr time for pain    Transfers                         Balance                                           ADL either performed or assessed with clinical judgement   ADL  Overall ADL's : Needs assistance/impaired Eating/Feeding: Moderate assistance;Bed level;With caregiver independent assisting (HOB elevated)   Grooming: Moderate assistance;Bed level (HOB elevated) Grooming Details (indicate cue type and reason): Pt reports excruciating pain with attempts                               General ADL Comments: limited by pain at arm with any movement; no ROM at shoulder or sling. pt is able to come out of sling for bathing and dressing    Extremity/Trunk Assessment Upper Extremity Assessment Upper Extremity Assessment: LUE deficits/detail LUE Deficits / Details: pt in coapt splint wtih elbow flexed and edema at hand. sling in good position. wrist and hand ROM is WFL LUE Coordination: decreased gross motor   Lower Extremity Assessment Lower Extremity Assessment: Defer to PT evaluation        Vision       Perception     Praxis     Communication     Cognition Arousal: Alert Behavior During Therapy: WFL for tasks assessed/performed, Anxious Cognition: No apparent impairments                               Following commands: Intact  Cueing   Cueing Techniques: Verbal cues  Exercises Exercises: Hand exercises Hand Exercises Wrist Flexion: AROM, Right, 10 reps Wrist Extension: AROM, Right, 10 reps Digit Composite Flexion: AROM, Right, 10 reps    Shoulder Instructions       General Comments      Pertinent Vitals/ Pain       Pain Assessment Pain Assessment: 0-10 Pain Score: 9  Pain Location: L arm Pain Descriptors / Indicators: Discomfort, Grimacing, Constant Pain Intervention(s): Limited activity within patient's tolerance, Monitored during session, RN gave pain meds during session  Home Living                                          Prior Functioning/Environment              Frequency  Min 3X/week        Progress Toward Goals  OT Goals(current goals can now be found  in the care plan section)  Progress towards OT goals: Progressing toward goals  Acute Rehab OT Goals Patient Stated Goal: less pain OT Goal Formulation: With patient/family Time For Goal Achievement: 08/27/24 Potential to Achieve Goals: Good  Plan      Co-evaluation                 AM-PAC OT 6 Clicks Daily Activity     Outcome Measure   Help from another person eating meals?: A Lot Help from another person taking care of personal grooming?: A Lot Help from another person toileting, which includes using toliet, bedpan, or urinal?: A Little Help from another person bathing (including washing, rinsing, drying)?: A Lot Help from another person to put on and taking off regular upper body clothing?: A Lot Help from another person to put on and taking off regular lower body clothing?: A Lot 6 Click Score: 13    End of Session Equipment Utilized During Treatment: Other (comment) (LUE splint and sling)  OT Visit Diagnosis: Muscle weakness (generalized) (M62.81);History of falling (Z91.81);Pain   Activity Tolerance Patient limited by pain   Patient Left in bed;with call bell/phone within reach;with bed alarm set   Nurse Communication Mobility status        Time: 1450-1515 OT Time Calculation (min): 25 min  Charges: OT General Charges $OT Visit: 1 Visit OT Treatments $Self Care/Home Management : 8-22 mins  Leita DEL OTR/L Acute Rehabilitation Services Office: [phone]  Leita PARAS [facility] 08/14/2024, 6:21 PM

## 2024-08-14 NOTE — Progress Notes (Signed)
 " PROGRESS NOTE    Willie Howe  FMW:[id number] DOB: Jul 11, 1961 DOA: 08/12/2024 PCP: Leila Lucie LABOR, MD  Subjective: No acute events since admission . Seen and examined at bedside. Reports having ongoing pain at L shoulder fracture site. Tolerating oral intake without n/v. Denies constipation.    Hospital Course:  As per H&P: 63 y.o. male with medical history significant of DVT, Graves', Parkinson's, rheumatoid arthritis who presented after a fall.  Patient fell down steps and landed on his left arm.  He developed severe pain so presented to the ER for further assessment.  On arrival he was afebrile and hemodynamically stable.  Labs were obtained which showed WBC 9.7, hemoglobin 12.3, BMP unrevealing.  X-ray of chest showed no acute abnormalities.  X-ray of humerus showed acute humeral shaft fracture.  Patient was admitted further workup.  Orthopedics was consulted.  Due to persistent pain patient was admitted for pain control.    Assessment and Plan:  Acute on chronic pain Left shoulder fracture - Patient presented after a fall found to have a periprosthetic humeral fracture -Orthopedics consulted with plans for outpatient surgery - hold and discontinue home norco PRN - cont acetaminophen  1000mg  TID - cont home gabapentin  100mg  TID - cont home oxycodone  5mg  PRN for moderate pain - cont oxycodone  10mg  PRN or hydromorphone  0.5mg  IV PRN for severe pain  - hold and discontinue home docusate - cont senna BID, miralax  daily PRN to avoid opioid induced constipation - plan for orthopedic surgery tomorrow   Hx of L shoulder septic arthritis Continue p.o. Doxy and IV ceftriaxone  which are scheduled for 6 weeks until January 7 FU outpatient with ortho outpatient   Hypertension-continue amlodipine    RLS-continue Requip    BPH-continue Flomax    CAD-continue aspirin   DVT prophylaxis: SCDs Start: 08/12/24 2106 ASA 81mg  BID SCDs   Code Status: Full Code  Disposition Plan: Home   Reason for continuing need for hospitalization: awaiting orthopedic surgery on 12/29  Objective: Vitals:   08/13/24 2223 08/14/24 0602 08/14/24 0713 08/14/24 1354  BP: (!) 156/76 139/73 (!) 146/79 (!) 159/75  Pulse: 93 83 86 91  Resp: 18 18    Temp: 99.4 F (37.4 C) 98.6 F (37 C) 98.4 F (36.9 C) 98.4 F (36.9 C)  TempSrc: Oral Oral Oral Oral  SpO2: 96% 95% 96% 97%    Intake/Output Summary (Last 24 hours) at 08/14/2024 1428 Last data filed at 08/14/2024 1000 Gross per 24 hour  Intake 680 ml  Output --  Net 680 ml   There were no vitals filed for this visit.  Examination:  Physical Exam Vitals and nursing note reviewed.  Constitutional:      General: He is not in acute distress.    Appearance: He is ill-appearing.     Comments: frail  HENT:     Head: Normocephalic and atraumatic.  Cardiovascular:     Rate and Rhythm: Normal rate and regular rhythm.     Pulses: Normal pulses.     Heart sounds: Normal heart sounds.  Pulmonary:     Effort: Pulmonary effort is normal.     Breath sounds: Normal breath sounds.  Abdominal:     General: Bowel sounds are normal.     Palpations: Abdomen is soft.  Musculoskeletal:     Comments: L shoulder bandage clean/dry/intact  Neurological:     Mental Status: He is alert.     Data Reviewed: I have personally reviewed following labs and imaging studies  CBC: Recent Labs  Lab 08/12/24 1936  WBC 9.7  NEUTROABS 6.3  HGB 12.3*  HCT 37.7*  MCV 91.3  PLT 269   Basic Metabolic Panel: Recent Labs  Lab 08/12/24 1936  NA 137  K 4.0  CL 104  CO2 23  GLUCOSE 112*  BUN 13  CREATININE 0.67  CALCIUM 9.3   GFR: CrCl cannot be calculated (Unknown ideal weight.). Liver Function Tests: No results for input(s): AST, ALT, ALKPHOS, BILITOT, PROT, ALBUMIN  in the last 168 hours. No results for input(s): LIPASE, AMYLASE in the last 168 hours. No results for input(s): AMMONIA in the last 168 hours. Coagulation  Profile: No results for input(s): INR, PROTIME in the last 168 hours. Cardiac Enzymes: No results for input(s): CKTOTAL, CKMB, CKMBINDEX, TROPONINI in the last 168 hours. ProBNP, BNP (last 5 results) No results for input(s): PROBNP, BNP in the last 8760 hours. HbA1C: No results for input(s): HGBA1C in the last 72 hours. CBG: Recent Labs  Lab 08/12/24 1727  GLUCAP 120*   Lipid Profile: No results for input(s): CHOL, HDL, LDLCALC, TRIG, CHOLHDL, LDLDIRECT in the last 72 hours. Thyroid  Function Tests: No results for input(s): TSH, T4TOTAL, FREET4, T3FREE, THYROIDAB in the last 72 hours. Anemia Panel: No results for input(s): VITAMINB12, FOLATE, FERRITIN, TIBC, IRON, RETICCTPCT in the last 72 hours. Sepsis Labs: No results for input(s): PROCALCITON, LATICACIDVEN in the last 168 hours.  No results found for this or any previous visit (from the past 240 hours).   Radiology Studies: DG Chest Portable 1 View Result Date: 08/12/2024 EXAM: 1 VIEW(S) XRAY OF THE CHEST 08/12/2024 05:29:00 PM COMPARISON: CXR 11/27/2019, CT Chest 11/28/2019. CLINICAL HISTORY: fall FINDINGS: LINES, TUBES AND DEVICES: Right upper extremity PICC in place with tip just proximal to the superior cavoatrial junction. LUNGS AND PLEURA: No focal pulmonary opacity. No pleural effusion. No pneumothorax. HEART AND MEDIASTINUM: No acute abnormality of the cardiac and mediastinal silhouettes. BONES AND SOFT TISSUES: Status post left reverse total arthroplasty. No acute osseous abnormality. IMPRESSION: 1. No acute cardiopulmonary abnormality. 2. Right upper extremity PICC tip just proximal to the superior cavoatrial junction. Electronically signed by: Morgane Naveau MD 08/12/2024 06:08 PM EST RP Workstation: [id number]   DG Humerus Left Result Date: 08/12/2024 EXAM: 1 VIEW(S) XRAY OF THE LEFT HUMERUS 08/12/2024 05:29:00 PM COMPARISON: xr left shoulder 07/13/24 CLINICAL  HISTORY: fall/l shoulder deformity FINDINGS: BONES AND JOINTS: Intact left reverse shoulder arthroplasty hardware. Acute, greater than half shaft width displaced, left mid humeral shaft periprosthetic fracture. SOFT TISSUES: The soft tissues are unremarkable. IMPRESSION: 1. Acute, greater than half shaft width displaced, left mid humeral shaft periprosthetic fracture. 2. Intact left reverse shoulder arthroplasty hardware. Electronically signed by: Morgane Naveau MD 08/12/2024 06:07 PM EST RP Workstation: [id number]   DG Shoulder Left Result Date: 08/12/2024 EXAM: 1 VIEW(S) XRAY OF THE LEFT SHOULDER 08/12/2024 05:29:00 PM COMPARISON: X-ray left shoulder 07/13/2024. CLINICAL HISTORY: fall/l shoulder deformity FINDINGS: BONES AND JOINTS: Left shoulder reverse arthroplasty noted. Intact hardware. Acute displaced left mid humeral shaft periprosthetic fracture. The [employer] joint is unremarkable. SOFT TISSUES: No abnormal calcifications. Visualized lung is unremarkable. IMPRESSION: 1. Acute displaced left mid humeral shaft periprosthetic fracture. 2. Left shoulder reverse arthroplasty with intact hardware. Electronically signed by: Morgane Naveau MD 08/12/2024 06:05 PM EST RP Workstation: [id number]    Scheduled Meds:  acetaminophen   1,000 mg Oral TID   amLODipine   5 mg Oral Daily   aspirin   81 mg Oral BID   Chlorhexidine  Gluconate Cloth  6 each Topical Daily  doxycycline   100 mg Oral BID   folic acid   1 mg Oral Daily   gabapentin   100 mg Oral TID   rOPINIRole   1.5 mg Oral TID   sodium chloride  flush  10-40 mL Intracatheter Q12H   tamsulosin   0.4 mg Oral QHS   Continuous Infusions:  cefTRIAXone  (ROCEPHIN )  IV 2 g (08/14/24 0905)     LOS: 1 day   Norval Bar, MD  [facility]  08/14/2024, 2:28 PM   "

## 2024-08-14 NOTE — Plan of Care (Signed)
   Problem: Clinical Measurements: Goal: Will remain free from infection Outcome: Progressing   Problem: Clinical Measurements: Goal: Diagnostic test results will improve Outcome: Progressing   Problem: Clinical Measurements: Goal: Respiratory complications will improve Outcome: Progressing

## 2024-08-14 NOTE — Discharge Summary (Addendum)
 " [facility] Discharge Summary   Patient name: Willie Howe  Admit date:     08/12/2024  Discharge date: 08/16/2024  Attending Physician: Monty Mccarrell, HASSAN [[id number]]  Discharge Physician: Norval Bar   PCP: Leila Lucie LABOR, MD  Admitted From: Home  Disposition:  Home with family assistance  Recommendations for Outpatient Follow-up:  Follow up with PCP in 1-2 weeks Follow up with ID clinic as scheduled Follow up with orthopedic surgery as scheduled  Home Health:No Equipment/Devices: @ECDMELIST @  Discharge Condition:Stable CODE STATUS:FULL Diet recommendation: Heart Healthy Fluid Restriction: None  Hospital Summary:  63 y.o. male with medical history significant of DVT, Graves', Parkinson's, rheumatoid arthritis who presented after a fall.  Patient fell down steps and landed on his left arm.  He developed severe pain so presented to the ER for further assessment.  On arrival he was afebrile and hemodynamically stable.  Labs were obtained which showed WBC 9.7, hemoglobin 12.3, BMP unrevealing.  X-ray of chest showed no acute abnormalities.  X-ray of humerus showed acute humeral shaft fracture.  Patient was admitted further workup.  Orthopedics was consulted.  Due to persistent pain patient was admitted for pain control. Underwent ORIF of L humeral shaft fracture on 12/29. Seen by ID and taken off IV ceftriaxone . PICC line removed prior to discharge. Stable for discharge to follow up with ID and orthopedic surgery teams as scheduled - cont doxycycline  to finish 08/24/2024 - take ASA 81mg  twice daily for post-op DVT PPX as per orthopedic surgery team - pain management as per orthopedic surgery team  Discharge Diagnoses:  Principal Problem:   Shoulder fracture   Discharge Instructions  Discharge Instructions     Increase activity slowly   Complete by: As directed       Allergies as of 08/16/2024       Reactions   Lisinopril Cough   Statins Other (See  Comments)   Muscle pain        Medication List     STOP taking these medications    cefTRIAXone  IVPB Commonly known as: ROCEPHIN    docusate sodium  100 MG capsule Commonly known as: COLACE   HYDROcodone -acetaminophen  7.5-325 MG tablet Commonly known as: NORCO   methotrexate  (PF) 50 MG/2ML injection   predniSONE  5 MG tablet Commonly known as: DELTASONE    RENFLEXIS  IV       TAKE these medications    rOPINIRole  1 MG tablet Commonly known as: REQUIP  Take 1.5 tablets (1.5 mg total) by mouth 3 (three) times daily. The timing of this medication is very important.   acetaminophen  500 MG tablet Commonly known as: TYLENOL  Take 2 tablets (1,000 mg total) by mouth every 8 (eight) hours for 14 days.   amLODipine  10 MG tablet Commonly known as: NORVASC  Take 5 mg by mouth daily.   aspirin  81 MG chewable tablet Commonly known as: Aspirin  Childrens Chew 1 tablet (81 mg total) by mouth 2 (two) times daily. For 6 weeks for DVT prophylaxis after surgery What changed: when to take this   celecoxib  100 MG capsule Commonly known as: CeleBREX  Take 1 capsule (100 mg total) by mouth 2 (two) times daily. For 2 weeks. Then take as needed What changed:  when to take this reasons to take this   doxycycline  100 MG capsule Commonly known as: VIBRAMYCIN  Take 1 capsule (100 mg total) by mouth 2 (two) times daily.   folic acid  1 MG tablet Commonly known as: FOLVITE  Take 1 mg by mouth daily.  gabapentin  100 MG capsule Commonly known as: NEURONTIN  Take 1 capsule (100 mg total) by mouth 3 (three) times daily. For pain What changed:  when to take this reasons to take this   hydrOXYzine  25 MG tablet Commonly known as: ATARAX  Take 1 tablet (25 mg total) by mouth every 8 (eight) hours as needed for up to 20 doses for itching.   loratadine  10 MG tablet Commonly known as: CLARITIN  Take 10 mg by mouth daily.   metFORMIN  500 MG 24 hr tablet Commonly known as: GLUCOPHAGE -XR Take  500 mg by mouth 2 (two) times daily.   methocarbamol  500 MG tablet Commonly known as: ROBAXIN  Take 1 tablet (500 mg total) by mouth every 8 (eight) hours as needed for muscle spasms.   oxyCODONE  5 MG immediate release tablet Commonly known as: Oxy IR/ROXICODONE  Take 1-2 pills every 6 hrs as needed for severe pain, no more than 6 per day   tamsulosin  0.4 MG Caps capsule Commonly known as: FLOMAX  Take 0.4 mg by mouth at bedtime.               Discharge Care Instructions  (From admission, onward)           Start     Ordered   08/16/24 0000  Discharge wound care:       Comments: As per orthopedic surgery team recommendations   08/16/24 1139   08/15/24 0000  Change dressing on IV access line weekly and PRN  (Home infusion instructions - [facility] )        08/15/24 1457            Allergies[1]  Discharge Exam: Vitals:   08/16/24 0423 08/16/24 0746  BP: 134/78 131/87  Pulse: 79 97  Resp: 17 18  Temp: 98.4 F (36.9 C) 98.2 F (36.8 C)  SpO2: 97% 98%    Physical Exam Vitals and nursing note reviewed.  Constitutional:      General: He is not in acute distress.    Appearance: He is obese. He is ill-appearing.     Comments: Weak, frail  HENT:     Head: Normocephalic and atraumatic.  Cardiovascular:     Rate and Rhythm: Normal rate and regular rhythm.     Pulses: Normal pulses.     Heart sounds: Normal heart sounds.  Pulmonary:     Effort: Pulmonary effort is normal.     Breath sounds: Normal breath sounds.  Abdominal:     General: Bowel sounds are normal.     Palpations: Abdomen is soft.  Neurological:     Mental Status: He is alert.     The results of significant diagnostics from this hospitalization (including imaging, microbiology, ancillary and laboratory) are listed below for reference.    Microbiology: Recent Results (from the past 240 hours)  Surgical pcr screen     Status: None   Collection Time: 08/15/24  7:00 AM    Specimen: Nasal Mucosa; Nasal Swab  Result Value Ref Range Status   MRSA, PCR NEGATIVE NEGATIVE Final   Staphylococcus aureus NEGATIVE NEGATIVE Final    Comment: (NOTE) The Xpert SA Assay (FDA approved for NASAL specimens in patients 15 years of age and older), is one component of a comprehensive surveillance program. It is not intended to diagnose infection nor to guide or monitor treatment. Performed at [facility], [address] [place], [place] [address]      Labs: ProBNP, BNP (last 5 results) No results for  input(s): PROBNP, BNP in the last 8760 hours. Basic Metabolic Panel: Recent Labs  Lab 08/12/24 1936  NA 137  K 4.0  CL 104  CO2 23  GLUCOSE 112*  BUN 13  CREATININE 0.67  CALCIUM 9.3   Liver Function Tests: No results for input(s): AST, ALT, ALKPHOS, BILITOT, PROT, ALBUMIN  in the last 168 hours. No results for input(s): LIPASE, AMYLASE in the last 168 hours. No results for input(s): AMMONIA in the last 168 hours. CBC: Recent Labs  Lab 08/12/24 1936  WBC 9.7  NEUTROABS 6.3  HGB 12.3*  HCT 37.7*  MCV 91.3  PLT 269   Cardiac Enzymes: No results for input(s): CKTOTAL, CKMB, CKMBINDEX, TROPONINI, TROPONINIHS in the last 168 hours. BNP: No results for input(s): BNP in the last 168 hours. CBG: Recent Labs  Lab 08/12/24 1727 08/15/24 1155 08/15/24 1447 08/15/24 1727  GLUCAP 120* 113* 110* 123*   D-Dimer No results for input(s): DDIMER in the last 72 hours. Hgb A1c No results for input(s): HGBA1C in the last 72 hours. Lipid Profile No results for input(s): CHOL, HDL, LDLCALC, TRIG, CHOLHDL, LDLDIRECT in the last 72 hours. Thyroid  function studies No results for input(s): TSH, T4TOTAL, FREET4, T3FREE, THYROIDAB in the last 72 hours.  Invalid input(s): FREET3 Anemia work up No results for input(s): VITAMINB12, FOLATE, FERRITIN, TIBC, IRON, RETICCTPCT in the last 72  hours. Urinalysis    Component Value Date/Time   COLORURINE YELLOW 12/05/2022 1044   APPEARANCEUR CLEAR 12/05/2022 1044   LABSPEC 1.025 12/05/2022 1044   PHURINE 5.0 12/05/2022 1044   GLUCOSEU NEGATIVE 12/05/2022 1044   HGBUR NEGATIVE 12/05/2022 1044   BILIRUBINUR NEGATIVE 12/05/2022 1044   KETONESUR NEGATIVE 12/05/2022 1044   PROTEINUR NEGATIVE 12/05/2022 1044   UROBILINOGEN 1.0 02/20/2014 2307   NITRITE NEGATIVE 12/05/2022 1044   LEUKOCYTESUR NEGATIVE 12/05/2022 1044   Sepsis Labs Recent Labs  Lab 08/12/24 1936  WBC 9.7    Procedures/Studies: DG Humerus Left Result Date: 08/15/2024 CLINICAL DATA:  Elective surgery. EXAM: LEFT HUMERUS - 2+ VIEW COMPARISON:  Preoperative imaging FINDINGS: Six fluoroscopic spot views of the left humerus submitted from the operating room. Sequential imaging during plate and screw fixation of periprosthetic humeral fracture. Fluoroscopy time 39.5 seconds. Dose 0.8 mGy. IMPRESSION: Intraoperative fluoroscopy during periprosthetic humeral fracture fixation. Electronically Signed   By: Andrea Gasman M.D.   On: 08/15/2024 18:32   DG Humerus Left Result Date: 08/15/2024 CLINICAL DATA:  Fracture, postop. EXAM: LEFT HUMERUS - 2+ VIEW COMPARISON:  None Available. FINDINGS: Plate and screw fixation of periprosthetic humeral shaft fracture. Reverse shoulder arthroplasty is also in place. Elbow alignment is maintained. Recent postsurgical change includes air and edema in the soft tissues. IMPRESSION: Plate and screw fixation of periprosthetic humeral shaft fracture. Electronically Signed   By: Andrea Gasman M.D.   On: 08/15/2024 18:31   DG C-Arm 1-60 Min-No Report Result Date: 08/15/2024 Fluoroscopy was utilized by the requesting physician.  No radiographic interpretation.   DG C-Arm 1-60 Min-No Report Result Date: 08/15/2024 Fluoroscopy was utilized by the requesting physician.  No radiographic interpretation.   DG Chest Portable 1 View Result  Date: 08/12/2024 EXAM: 1 VIEW(S) XRAY OF THE CHEST 08/12/2024 05:29:00 PM COMPARISON: CXR 11/27/2019, CT Chest 11/28/2019. CLINICAL HISTORY: fall FINDINGS: LINES, TUBES AND DEVICES: Right upper extremity PICC in place with tip just proximal to the superior cavoatrial junction. LUNGS AND PLEURA: No focal pulmonary opacity. No pleural effusion. No pneumothorax. HEART AND MEDIASTINUM: No acute abnormality of the cardiac  and mediastinal silhouettes. BONES AND SOFT TISSUES: Status post left reverse total arthroplasty. No acute osseous abnormality. IMPRESSION: 1. No acute cardiopulmonary abnormality. 2. Right upper extremity PICC tip just proximal to the superior cavoatrial junction. Electronically signed by: Morgane Naveau MD 08/12/2024 06:08 PM EST RP Workstation: [id number]   DG Humerus Left Result Date: 08/12/2024 EXAM: 1 VIEW(S) XRAY OF THE LEFT HUMERUS 08/12/2024 05:29:00 PM COMPARISON: xr left shoulder 07/13/24 CLINICAL HISTORY: fall/l shoulder deformity FINDINGS: BONES AND JOINTS: Intact left reverse shoulder arthroplasty hardware. Acute, greater than half shaft width displaced, left mid humeral shaft periprosthetic fracture. SOFT TISSUES: The soft tissues are unremarkable. IMPRESSION: 1. Acute, greater than half shaft width displaced, left mid humeral shaft periprosthetic fracture. 2. Intact left reverse shoulder arthroplasty hardware. Electronically signed by: Morgane Naveau MD 08/12/2024 06:07 PM EST RP Workstation: [id number]   DG Shoulder Left Result Date: 08/12/2024 EXAM: 1 VIEW(S) XRAY OF THE LEFT SHOULDER 08/12/2024 05:29:00 PM COMPARISON: X-ray left shoulder 07/13/2024. CLINICAL HISTORY: fall/l shoulder deformity FINDINGS: BONES AND JOINTS: Left shoulder reverse arthroplasty noted. Intact hardware. Acute displaced left mid humeral shaft periprosthetic fracture. [facility] joint is unremarkable. SOFT TISSUES: No abnormal calcifications. Visualized lung is unremarkable. IMPRESSION: 1. Acute displaced  left mid humeral shaft periprosthetic fracture. 2. Left shoulder reverse arthroplasty with intact hardware. Electronically signed by: Morgane Naveau MD 08/12/2024 06:05 PM EST RP Workstation: [id number]    Time coordinating discharge: 40 mins  SIGNED:  Norval Bar, MD [facility] 08/16/2024, 11:43 AM     [1]  Allergies Allergen Reactions   Lisinopril Cough   Statins Other (See Comments)    Muscle pain   "

## 2024-08-14 NOTE — TOC Transition Note (Signed)
 Transition of Care [facility]) - Discharge Note   Patient Details  Name: Willie Howe MRN: [id number] Date of Birth: 1961/05/22  [facility]) CM/SW Contact:  Robynn Eileen Hoose, RN Phone Number: 08/14/2024, 12:08 PM   Clinical Narrative:  Patient is being discharged today. Pam with [employer] and Angie with [facility] made aware.           Patient Goals and CMS Choice            Discharge Placement                       Discharge Plan and Services Additional resources added to the After Visit Summary for                                       Social Drivers of Health (SDOH) Interventions SDOH Screenings   Food Insecurity: No Food Insecurity (08/12/2024)  Housing: Low Risk (08/12/2024)  Transportation Needs: No Transportation Needs (08/12/2024)  Utilities: Not At Risk (08/12/2024)  Depression (PHQ2-9): Low Risk (10/25/2021)  Social Connections: Socially Integrated (08/12/2024)  Tobacco Use: Medium Risk (08/12/2024)     Readmission Risk Interventions    07/14/2024    1:29 PM  Readmission Risk Prevention Plan  Post Dischage Appt Complete  Medication Screening Complete  Transportation Screening Complete

## 2024-08-14 NOTE — Discharge Summary (Incomplete Revision)
 " [facility] Discharge Summary   Patient name: Willie Howe  Admit date:     08/12/2024  Discharge date: 08/14/2024  Attending Physician: Tearah Saulsbury, HASSAN [[id number]]  Discharge Physician: Norval Bar   PCP: Leila Lucie LABOR, MD  Admitted From: Home  Disposition:  Home with family assistance  Recommendations for Outpatient Follow-up:  Follow up with PCP in 1-2 weeks Follow up with ID clinic as scheduled Follow up with orthopedic surgery as scheduled  Home Health:No Equipment/Devices: @ECDMELIST @  Discharge Condition:Stable CODE STATUS:FULL Diet recommendation: Heart Healthy Fluid Restriction: None  Hospital Summary:  63 y.o. male with medical history significant of DVT, Graves', Parkinson's, rheumatoid arthritis who presented after a fall.  Patient fell down steps and landed on his left arm.  He developed severe pain so presented to the ER for further assessment.  On arrival he was afebrile and hemodynamically stable.  Labs were obtained which showed WBC 9.7, hemoglobin 12.3, BMP unrevealing.  X-ray of chest showed no acute abnormalities.  X-ray of humerus showed acute humeral shaft fracture.  Patient was admitted further workup.  Orthopedics was consulted.  Due to persistent pain patient was admitted for pain control.   Hospital Course by Problem:  Acute on chronic pain Left shoulder fracture Patient presented after a fall found to have a periprosthetic humeral fracture. Orthopedics consulted with plans for outpatient surgery - started on acetaminophen  1000mg  TID - cont home gabapentin  100mg  TID - cont home oxycodone  5mg  PRN for moderate pain - stop home hydrocodone  PRN as it seems to be a duplicate medication for the same pain indication - discontinues home docusate as not effective bowel regimen - started senna BID to avoid opioid induced constipation - follow up with orthopedic surgery outpatient on 08/17/24 - follow up with PCP within one week   Hx  of L shoulder septic arthritis Continue p.o. Doxy and IV ceftriaxone  which are scheduled for 6 weeks until January 7 FU outpatient with ortho outpatient as scheduled  Discharge Diagnoses:  Principal Problem:   Shoulder fracture   Discharge Instructions  Discharge Instructions     Increase activity slowly   Complete by: As directed       Allergies as of 08/14/2024       Reactions   Lisinopril Cough   Statins Other (See Comments)   Muscle pain        Medication List     STOP taking these medications    docusate sodium  100 MG capsule Commonly known as: COLACE   HYDROcodone -acetaminophen  7.5-325 MG tablet Commonly known as: NORCO   methotrexate  (PF) 50 MG/2ML injection   predniSONE  5 MG tablet Commonly known as: DELTASONE    RENFLEXIS  IV       TAKE these medications    rOPINIRole  1 MG tablet Commonly known as: REQUIP  Take 1.5 tablets (1.5 mg total) by mouth 3 (three) times daily. The timing of this medication is very important.   acetaminophen  500 MG tablet Commonly known as: TYLENOL  Take 2 tablets (1,000 mg total) by mouth 3 (three) times daily.   amLODipine  10 MG tablet Commonly known as: NORVASC  Take 5 mg by mouth daily.   aspirin  81 MG chewable tablet Commonly known as: Aspirin  Childrens Chew 1 tablet (81 mg total) by mouth 2 (two) times daily. For 6 weeks for DVT prophylaxis after surgery What changed: when to take this   cefTRIAXone  IVPB Commonly known as: ROCEPHIN  Inject 2 g into the vein daily. Indication:  prosthetic joint infection First  Dose: No Last Day of Therapy:  08/24/2024 Labs - Once weekly:  CBC/D and BMP, Labs - Once weekly: ESR and CRP Method of administration: IV Push Method of administration may be changed at the discretion of home infusion pharmacist based upon assessment of the patient and/or caregiver's ability to self-administer the medication ordered.   celecoxib  100 MG capsule Commonly known as: CeleBREX  Take 1  capsule (100 mg total) by mouth 2 (two) times daily. For 2 weeks. Then take as needed What changed:  when to take this reasons to take this   doxycycline  100 MG capsule Commonly known as: VIBRAMYCIN  Take 1 capsule (100 mg total) by mouth 2 (two) times daily.   folic acid  1 MG tablet Commonly known as: FOLVITE  Take 1 mg by mouth daily.   gabapentin  100 MG capsule Commonly known as: NEURONTIN  Take 1 capsule (100 mg total) by mouth 3 (three) times daily. For pain What changed:  when to take this reasons to take this   hydrOXYzine  25 MG tablet Commonly known as: ATARAX  Take 1 tablet (25 mg total) by mouth every 8 (eight) hours as needed for up to 20 doses for itching.   loratadine  10 MG tablet Commonly known as: CLARITIN  Take 10 mg by mouth daily.   metFORMIN  500 MG 24 hr tablet Commonly known as: GLUCOPHAGE -XR Take 500 mg by mouth 2 (two) times daily.   oxyCODONE  5 MG immediate release tablet Commonly known as: Oxy IR/ROXICODONE  Take 5 mg by mouth every 6 (six) hours as needed for severe pain (pain score 7-10).   tamsulosin  0.4 MG Caps capsule Commonly known as: FLOMAX  Take 0.4 mg by mouth at bedtime.        Allergies[1]  Discharge Exam: Vitals:   08/14/24 0602 08/14/24 0713  BP: 139/73 (!) 146/79  Pulse: 83 86  Resp: 18   Temp: 98.6 F (37 C) 98.4 F (36.9 C)  SpO2: 95% 96%    Physical Exam Vitals and nursing note reviewed.  Constitutional:      General: He is not in acute distress.    Appearance: He is obese. He is ill-appearing.     Comments: Weak, frail  HENT:     Head: Normocephalic and atraumatic.  Cardiovascular:     Rate and Rhythm: Normal rate and regular rhythm.     Pulses: Normal pulses.     Heart sounds: Normal heart sounds.  Pulmonary:     Effort: Pulmonary effort is normal.     Breath sounds: Normal breath sounds.  Abdominal:     General: Bowel sounds are normal.     Palpations: Abdomen is soft.  Neurological:     Mental Status:  He is alert.     The results of significant diagnostics from this hospitalization (including imaging, microbiology, ancillary and laboratory) are listed below for reference.    Microbiology: No results found for this or any previous visit (from the past 240 hours).   Labs: ProBNP, BNP (last 5 results) No results for input(s): PROBNP, BNP in the last 8760 hours. Basic Metabolic Panel: Recent Labs  Lab 08/12/24 1936  NA 137  K 4.0  CL 104  CO2 23  GLUCOSE 112*  BUN 13  CREATININE 0.67  CALCIUM 9.3   Liver Function Tests: No results for input(s): AST, ALT, ALKPHOS, BILITOT, PROT, ALBUMIN  in the last 168 hours. No results for input(s): LIPASE, AMYLASE in the last 168 hours. No results for input(s): AMMONIA in the last 168 hours. CBC: Recent Labs  Lab 08/12/24 1936  WBC 9.7  NEUTROABS 6.3  HGB 12.3*  HCT 37.7*  MCV 91.3  PLT 269   Cardiac Enzymes: No results for input(s): CKTOTAL, CKMB, CKMBINDEX, TROPONINI, TROPONINIHS in the last 168 hours. BNP: No results for input(s): BNP in the last 168 hours. CBG: Recent Labs  Lab 08/12/24 1727  GLUCAP 120*   D-Dimer No results for input(s): DDIMER in the last 72 hours. Hgb A1c No results for input(s): HGBA1C in the last 72 hours. Lipid Profile No results for input(s): CHOL, HDL, LDLCALC, TRIG, CHOLHDL, LDLDIRECT in the last 72 hours. Thyroid  function studies No results for input(s): TSH, T4TOTAL, FREET4, T3FREE, THYROIDAB in the last 72 hours.  Invalid input(s): FREET3 Anemia work up No results for input(s): VITAMINB12, FOLATE, FERRITIN, TIBC, IRON, RETICCTPCT in the last 72 hours. Urinalysis    Component Value Date/Time   COLORURINE YELLOW 12/05/2022 1044   APPEARANCEUR CLEAR 12/05/2022 1044   LABSPEC 1.025 12/05/2022 1044   PHURINE 5.0 12/05/2022 1044   GLUCOSEU NEGATIVE 12/05/2022 1044   HGBUR NEGATIVE 12/05/2022 1044   BILIRUBINUR  NEGATIVE 12/05/2022 1044   KETONESUR NEGATIVE 12/05/2022 1044   PROTEINUR NEGATIVE 12/05/2022 1044   UROBILINOGEN 1.0 02/20/2014 2307   NITRITE NEGATIVE 12/05/2022 1044   LEUKOCYTESUR NEGATIVE 12/05/2022 1044   Sepsis Labs Recent Labs  Lab 08/12/24 1936  WBC 9.7    Procedures/Studies: DG Chest Portable 1 View Result Date: 08/12/2024 EXAM: 1 VIEW(S) XRAY OF THE CHEST 08/12/2024 05:29:00 PM COMPARISON: CXR 11/27/2019, CT Chest 11/28/2019. CLINICAL HISTORY: fall FINDINGS: LINES, TUBES AND DEVICES: Right upper extremity PICC in place with tip just proximal to the superior cavoatrial junction. LUNGS AND PLEURA: No focal pulmonary opacity. No pleural effusion. No pneumothorax. HEART AND MEDIASTINUM: No acute abnormality of the cardiac and mediastinal silhouettes. BONES AND SOFT TISSUES: Status post left reverse total arthroplasty. No acute osseous abnormality. IMPRESSION: 1. No acute cardiopulmonary abnormality. 2. Right upper extremity PICC tip just proximal to the superior cavoatrial junction. Electronically signed by: Morgane Naveau MD 08/12/2024 06:08 PM EST RP Workstation: [id number]   DG Humerus Left Result Date: 08/12/2024 EXAM: 1 VIEW(S) XRAY OF THE LEFT HUMERUS 08/12/2024 05:29:00 PM COMPARISON: xr left shoulder 07/13/24 CLINICAL HISTORY: fall/l shoulder deformity FINDINGS: BONES AND JOINTS: Intact left reverse shoulder arthroplasty hardware. Acute, greater than half shaft width displaced, left mid humeral shaft periprosthetic fracture. SOFT TISSUES: The soft tissues are unremarkable. IMPRESSION: 1. Acute, greater than half shaft width displaced, left mid humeral shaft periprosthetic fracture. 2. Intact left reverse shoulder arthroplasty hardware. Electronically signed by: Morgane Naveau MD 08/12/2024 06:07 PM EST RP Workstation: [id number]   DG Shoulder Left Result Date: 08/12/2024 EXAM: 1 VIEW(S) XRAY OF THE LEFT SHOULDER 08/12/2024 05:29:00 PM COMPARISON: X-ray left shoulder  07/13/2024. CLINICAL HISTORY: fall/l shoulder deformity FINDINGS: BONES AND JOINTS: Left shoulder reverse arthroplasty noted. Intact hardware. Acute displaced left mid humeral shaft periprosthetic fracture. [facility] joint is unremarkable. SOFT TISSUES: No abnormal calcifications. Visualized lung is unremarkable. IMPRESSION: 1. Acute displaced left mid humeral shaft periprosthetic fracture. 2. Left shoulder reverse arthroplasty with intact hardware. Electronically signed by: Morgane Naveau MD 08/12/2024 06:05 PM EST RP Workstation: [id number]    Time coordinating discharge: 40 mins  SIGNED:  Norval Bar, MD [facility] 08/14/2024, 11:31 AM     [1]  Allergies Allergen Reactions   Lisinopril Cough   Statins Other (See Comments)    Muscle pain   "

## 2024-08-15 ENCOUNTER — Encounter (HOSPITAL_COMMUNITY): Payer: Self-pay | Admitting: Internal Medicine

## 2024-08-15 ENCOUNTER — Inpatient Hospital Stay (HOSPITAL_COMMUNITY)

## 2024-08-15 ENCOUNTER — Other Ambulatory Visit: Payer: Self-pay

## 2024-08-15 ENCOUNTER — Inpatient Hospital Stay (HOSPITAL_COMMUNITY): Admitting: Certified Registered Nurse Anesthetist

## 2024-08-15 ENCOUNTER — Encounter (HOSPITAL_COMMUNITY): Admission: EM | Disposition: A | Discharge status: Home / Self Care | Payer: Self-pay | Source: Home / Self Care

## 2024-08-15 DIAGNOSIS — I10 Essential (primary) hypertension: Secondary | ICD-10-CM | POA: Diagnosis not present

## 2024-08-15 DIAGNOSIS — M9732XA Periprosthetic fracture around internal prosthetic left shoulder joint, initial encounter: Secondary | ICD-10-CM | POA: Diagnosis not present

## 2024-08-15 DIAGNOSIS — Z96619 Presence of unspecified artificial shoulder joint: Secondary | ICD-10-CM

## 2024-08-15 DIAGNOSIS — E119 Type 2 diabetes mellitus without complications: Secondary | ICD-10-CM | POA: Diagnosis not present

## 2024-08-15 DIAGNOSIS — Z87891 Personal history of nicotine dependence: Secondary | ICD-10-CM

## 2024-08-15 DIAGNOSIS — T8459XD Infection and inflammatory reaction due to other internal joint prosthesis, subsequent encounter: Secondary | ICD-10-CM

## 2024-08-15 HISTORY — PX: ORIF HUMERUS FRACTURE: SHX2126

## 2024-08-15 LAB — SURGICAL PCR SCREEN
MRSA, PCR: NEGATIVE
Staphylococcus aureus: NEGATIVE

## 2024-08-15 LAB — GLUCOSE, CAPILLARY
Glucose-Capillary: 113 mg/dL — ABNORMAL HIGH (ref 70–99)
Glucose-Capillary: 110 mg/dL — ABNORMAL HIGH (ref 70–99)
Glucose-Capillary: 123 mg/dL — ABNORMAL HIGH (ref 70–99)

## 2024-08-15 MED ORDER — ACETAMINOPHEN 500 MG PO TABS: 1000.0000 mg | ORAL_TABLET | Freq: Three times a day (TID) | ORAL | 0 refills | Status: AC

## 2024-08-15 MED ORDER — VANCOMYCIN HCL 1000 MG IV SOLR
INTRAVENOUS | Status: DC | PRN
  Administered 2024-08-15: 1000 mg via TOPICAL

## 2024-08-15 MED ORDER — MIDAZOLAM HCL 2 MG/2ML IJ SOLN
INTRAMUSCULAR | Status: AC
  Filled 2024-08-15: qty 2

## 2024-08-15 MED ORDER — SUGAMMADEX SODIUM 200 MG/2ML IV SOLN
INTRAVENOUS | Status: DC | PRN
  Administered 2024-08-15: 200 mg via INTRAVENOUS

## 2024-08-15 MED ORDER — LIDOCAINE 2% (20 MG/ML) 5 ML SYRINGE
INTRAMUSCULAR | Status: AC
  Filled 2024-08-15: qty 10

## 2024-08-15 MED ORDER — PHENYLEPHRINE HCL-NACL 20-0.9 MG/250ML-% IV SOLN
INTRAVENOUS | Status: DC | PRN
  Administered 2024-08-15: 35 ug/min via INTRAVENOUS

## 2024-08-15 MED ORDER — PROPOFOL 10 MG/ML IV BOLUS
INTRAVENOUS | Status: AC
  Filled 2024-08-15: qty 20

## 2024-08-15 MED ORDER — ACETAMINOPHEN 500 MG PO TABS: 1000.0000 mg | ORAL_TABLET | Freq: Once | ORAL | Status: DC

## 2024-08-15 MED ORDER — CEFAZOLIN SODIUM-DEXTROSE 2-4 GM/100ML-% IV SOLN
INTRAVENOUS | Status: AC
  Filled 2024-08-15: qty 100

## 2024-08-15 MED ORDER — GABAPENTIN 300 MG PO CAPS: 300.0000 mg | ORAL_CAPSULE | Freq: Once | ORAL | Status: DC

## 2024-08-15 MED ORDER — METHOCARBAMOL 500 MG PO TABS: 500.0000 mg | ORAL_TABLET | Freq: Three times a day (TID) | ORAL | 0 refills | Status: AC | PRN

## 2024-08-15 MED ORDER — FENTANYL CITRATE (PF) 100 MCG/2ML IJ SOLN
INTRAMUSCULAR | Status: AC
  Administered 2024-08-15: 100 ug via INTRAVENOUS
  Filled 2024-08-15: qty 2

## 2024-08-15 MED ORDER — ROCURONIUM BROMIDE 10 MG/ML (PF) SYRINGE
PREFILLED_SYRINGE | INTRAVENOUS | Status: DC | PRN
  Administered 2024-08-15: 40 mg via INTRAVENOUS
  Administered 2024-08-15: 60 mg via INTRAVENOUS

## 2024-08-15 MED ORDER — ASPIRIN 81 MG PO CHEW: 81.0000 mg | CHEWABLE_TABLET | Freq: Two times a day (BID) | ORAL | 0 refills | Status: AC

## 2024-08-15 MED ORDER — OXYCODONE HCL 5 MG/5ML PO SOLN: 5.0000 mg | Freq: Once | ORAL | Status: DC | PRN

## 2024-08-15 MED ORDER — FENTANYL CITRATE (PF) 100 MCG/2ML IJ SOLN
INTRAMUSCULAR | Status: AC
  Filled 2024-08-15: qty 2

## 2024-08-15 MED ORDER — PROPOFOL 500 MG/50ML IV EMUL
INTRAVENOUS | Status: DC | PRN
  Administered 2024-08-15: 125 ug/kg/min via INTRAVENOUS

## 2024-08-15 MED ORDER — DROPERIDOL 2.5 MG/ML IJ SOLN: 0.6250 mg | Freq: Once | INTRAMUSCULAR | Status: DC | PRN

## 2024-08-15 MED ORDER — PHENYLEPHRINE 80 MCG/ML (10ML) SYRINGE FOR IV PUSH (FOR BLOOD PRESSURE SUPPORT)
PREFILLED_SYRINGE | INTRAVENOUS | Status: DC | PRN
  Administered 2024-08-15 (×2): 160 ug via INTRAVENOUS

## 2024-08-15 MED ORDER — FENTANYL CITRATE (PF) 250 MCG/5ML IJ SOLN
INTRAMUSCULAR | Status: DC | PRN
  Administered 2024-08-15: 100 ug via INTRAVENOUS

## 2024-08-15 MED ORDER — TRANEXAMIC ACID-NACL 1000-0.7 MG/100ML-% IV SOLN
INTRAVENOUS | Status: AC
  Filled 2024-08-15: qty 100

## 2024-08-15 MED ORDER — EPHEDRINE SULFATE-NACL 50-0.9 MG/10ML-% IV SOSY
PREFILLED_SYRINGE | INTRAVENOUS | Status: DC | PRN
  Administered 2024-08-15: 10 mg via INTRAVENOUS

## 2024-08-15 MED ORDER — BUPIVACAINE LIPOSOME 1.3 % IJ SUSP
INTRAMUSCULAR | Status: DC | PRN
  Administered 2024-08-15: 10 mL via PERINEURAL

## 2024-08-15 MED ORDER — BUPIVACAINE HCL (PF) 0.5 % IJ SOLN
INTRAMUSCULAR | Status: DC | PRN
  Administered 2024-08-15: 10 mL via PERINEURAL

## 2024-08-15 MED ORDER — FENTANYL CITRATE (PF) 100 MCG/2ML IJ SOLN: 100.0000 ug | Freq: Once | INTRAMUSCULAR | Status: AC

## 2024-08-15 MED ORDER — TRANEXAMIC ACID-NACL 1000-0.7 MG/100ML-% IV SOLN
1000.0000 mg | INTRAVENOUS | Status: AC
  Administered 2024-08-15: 1000 mg via INTRAVENOUS

## 2024-08-15 MED ORDER — GABAPENTIN 100 MG PO CAPS: 100.0000 mg | ORAL_CAPSULE | Freq: Three times a day (TID) | ORAL | 0 refills | Status: AC

## 2024-08-15 MED ORDER — FENTANYL CITRATE (PF) 100 MCG/2ML IJ SOLN
25.0000 ug | INTRAMUSCULAR | Status: DC | PRN
  Administered 2024-08-15 (×2): 50 ug via INTRAVENOUS

## 2024-08-15 MED ORDER — ROCURONIUM BROMIDE 10 MG/ML (PF) SYRINGE
PREFILLED_SYRINGE | INTRAVENOUS | Status: AC
  Filled 2024-08-15: qty 20

## 2024-08-15 MED ORDER — INSULIN ASPART 100 UNIT/ML IJ SOLN
0.0000 [IU] | INTRAMUSCULAR | Status: DC | PRN
  Filled 2024-08-15: qty 0.14

## 2024-08-15 MED ORDER — CELECOXIB 100 MG PO CAPS: 100.0000 mg | ORAL_CAPSULE | Freq: Two times a day (BID) | ORAL | 0 refills | Status: AC

## 2024-08-15 MED ORDER — LACTATED RINGERS IV SOLN: INTRAVENOUS | Status: DC

## 2024-08-15 MED ORDER — VANCOMYCIN HCL 1000 MG IV SOLR
INTRAVENOUS | Status: AC
  Filled 2024-08-15: qty 20

## 2024-08-15 MED ORDER — EPHEDRINE 5 MG/ML INJ
INTRAVENOUS | Status: AC
  Filled 2024-08-15: qty 5

## 2024-08-15 MED ORDER — ORAL CARE MOUTH RINSE: 15.0000 mL | Freq: Once | OROMUCOSAL | Status: AC

## 2024-08-15 MED ORDER — DEXMEDETOMIDINE HCL IN NACL 80 MCG/20ML IV SOLN
INTRAVENOUS | Status: DC | PRN
  Administered 2024-08-15: 12 ug via INTRAVENOUS

## 2024-08-15 MED ORDER — PROPOFOL 10 MG/ML IV BOLUS
INTRAVENOUS | Status: DC | PRN
  Administered 2024-08-15: 50 mg via INTRAVENOUS
  Administered 2024-08-15: 100 mg via INTRAVENOUS

## 2024-08-15 MED ORDER — CHLORHEXIDINE GLUCONATE 0.12 % MT SOLN: 15.0000 mL | Freq: Once | OROMUCOSAL | Status: AC

## 2024-08-15 MED ORDER — OXYCODONE HCL 5 MG PO TABS: 5.0000 mg | ORAL_TABLET | Freq: Once | ORAL | Status: DC | PRN

## 2024-08-15 MED ORDER — PHENYLEPHRINE 80 MCG/ML (10ML) SYRINGE FOR IV PUSH (FOR BLOOD PRESSURE SUPPORT)
PREFILLED_SYRINGE | INTRAVENOUS | Status: AC
  Filled 2024-08-15: qty 10

## 2024-08-15 MED ORDER — ACETAMINOPHEN 10 MG/ML IV SOLN: 1000.0000 mg | Freq: Once | INTRAVENOUS | Status: DC | PRN

## 2024-08-15 MED ORDER — LIDOCAINE 2% (20 MG/ML) 5 ML SYRINGE
INTRAMUSCULAR | Status: DC | PRN
  Administered 2024-08-15: 100 mg via INTRAVENOUS

## 2024-08-15 MED ORDER — CEFAZOLIN SODIUM-DEXTROSE 2-4 GM/100ML-% IV SOLN
2.0000 g | INTRAVENOUS | Status: AC
  Administered 2024-08-15: 2 g via INTRAVENOUS

## 2024-08-15 MED ORDER — OXYCODONE HCL 5 MG PO TABS: ORAL_TABLET | ORAL | 0 refills | Status: AC

## 2024-08-15 MED ORDER — ONDANSETRON HCL 4 MG/2ML IJ SOLN
INTRAMUSCULAR | Status: DC | PRN
  Administered 2024-08-15: 4 mg via INTRAVENOUS

## 2024-08-15 MED ORDER — ONDANSETRON HCL 4 MG/2ML IJ SOLN
INTRAMUSCULAR | Status: AC
  Filled 2024-08-15: qty 2

## 2024-08-15 MED ORDER — CHLORHEXIDINE GLUCONATE 0.12 % MT SOLN
OROMUCOSAL | Status: AC
  Administered 2024-08-15: 15 mL via OROMUCOSAL
  Filled 2024-08-15: qty 15

## 2024-08-15 MED ORDER — ALBUMIN HUMAN 5 % IV SOLN: INTRAVENOUS | Status: DC | PRN

## 2024-08-15 SURGICAL SUPPLY — 1 items: 3.5mm cable post IMPLANT

## 2024-08-15 NOTE — Plan of Care (Signed)
" °  Problem: Education: Goal: Knowledge of General Education information will improve Description: Including pain rating scale, medication(s)/side effects and non-pharmacologic comfort measures 08/15/2024 0619 by Baldwin Doran BIRCH, RN Outcome: Not Progressing 08/14/2024 2126 by Baldwin Doran BIRCH, RN Outcome: Not Progressing   Problem: Health Behavior/Discharge Planning: Goal: Ability to manage health-related needs will improve 08/15/2024 0619 by Baldwin Doran BIRCH, RN Outcome: Not Progressing 08/14/2024 2126 by Baldwin Doran BIRCH, RN Outcome: Not Progressing   Problem: Clinical Measurements: Goal: Ability to maintain clinical measurements within normal limits will improve 08/15/2024 0619 by Baldwin Doran BIRCH, RN Outcome: Not Progressing 08/14/2024 2126 by Baldwin Doran BIRCH, RN Outcome: Not Progressing Goal: Will remain free from infection 08/15/2024 0619 by Baldwin Doran BIRCH, RN Outcome: Not Progressing 08/14/2024 2126 by Baldwin Doran BIRCH, RN Outcome: Not Progressing Goal: Diagnostic test results will improve 08/15/2024 0619 by Baldwin Doran BIRCH, RN Outcome: Not Progressing 08/14/2024 2126 by Baldwin Doran D, RN Outcome: Not Progressing Goal: Respiratory complications will improve 08/15/2024 0619 by Baldwin Doran BIRCH, RN Outcome: Not Progressing 08/14/2024 2126 by Baldwin Doran D, RN Outcome: Not Progressing Goal: Cardiovascular complication will be avoided 08/15/2024 9380 by Baldwin Doran BIRCH, RN Outcome: Not Progressing 08/14/2024 2126 by Baldwin Doran BIRCH, RN Outcome: Not Progressing   Problem: Activity: Goal: Risk for activity intolerance will decrease 08/15/2024 0619 by Baldwin Doran BIRCH, RN Outcome: Not Progressing 08/14/2024 2126 by Baldwin Doran BIRCH, RN Outcome: Not Progressing   Problem: Nutrition: Goal: Adequate nutrition will be maintained 08/15/2024 0619 by Baldwin Doran BIRCH, RN Outcome: Not Progressing 08/14/2024 2126 by Baldwin Doran D, RN Outcome: Not Progressing   Problem: Coping: Goal: Level of anxiety will decrease 08/15/2024 0619 by Baldwin Doran BIRCH, RN Outcome: Not Progressing 08/14/2024 2126 by Baldwin Doran BIRCH, RN Outcome: Not Progressing   "

## 2024-08-15 NOTE — Transfer of Care (Signed)
 Immediate Anesthesia Transfer of Care Note  Patient: Willie Howe  Procedure(s) Performed: OPEN REDUCTION INTERNAL FIXATION (ORIF) HUMERAL SHAFT FRACTURE (Left)  Patient Location: PACU  Anesthesia Type:Generaland regional  Level of Consciousness: awake, drowsy, patient cooperative, and responds to stimulation  Airway & Oxygen  Therapy: Patient Spontanous Breathing and Patient connected to face mask oxygen   Post-op Assessment: Report given to RN and Post -op Vital signs reviewed and stable  Post vital signs: Reviewed and stable  Last Vitals:  Vitals Value Taken Time  BP 111/59 08/15/24 17:22  Temp    Pulse 85 08/15/24 17:26  Resp 21 08/15/24 17:26  SpO2 99 % 08/15/24 17:26  Vitals shown include unfiled device data.  Last Pain:  Vitals:   08/15/24 1159  TempSrc:   PainSc: 6       Patients Stated Pain Goal: 0 (08/15/24 0618)  Complications: No notable events documented.

## 2024-08-15 NOTE — Plan of Care (Signed)
  Problem: Education: Goal: Knowledge of General Education information will improve Description: Including pain rating scale, medication(s)/side effects and non-pharmacologic comfort measures Outcome: Progressing   Problem: Nutrition: Goal: Adequate nutrition will be maintained Outcome: Progressing   Problem: Pain Managment: Goal: General experience of comfort will improve and/or be controlled Outcome: Progressing

## 2024-08-15 NOTE — Interval H&P Note (Signed)
 All questions answered

## 2024-08-15 NOTE — Anesthesia Preprocedure Evaluation (Signed)
 "                                  Anesthesia Evaluation  Patient identified by MRN, date of birth, ID band Patient awake    Reviewed: Allergy & Precautions, NPO status , Patient's Chart, lab work & pertinent test results, reviewed documented beta blocker date and time   Airway Mallampati: II  TM Distance: >3 FB     Dental  (+) Teeth Intact, Dental Advisory Given   Pulmonary sleep apnea and Continuous Positive Airway Pressure Ventilation , former smoker   Pulmonary exam normal breath sounds clear to auscultation       Cardiovascular hypertension, Pt. on medications (-) Past MI Normal cardiovascular exam Rhythm:Regular Rate:Normal  EKG 07/07/24 NSR, RBBB + LAFB  Echo 08/22/21 1. Left ventricular ejection fraction, by estimation, is 60 to 65%. The  left ventricle has normal function. The left ventricle has no regional  wall motion abnormalities. There is mild left ventricular hypertrophy.  Left ventricular diastolic parameters  were normal.   2. Right ventricular systolic function is normal. The right ventricular  size is normal. There is normal pulmonary artery systolic pressure. The  estimated right ventricular systolic pressure is 27.2 mmHg.   3. Left atrial size was upper normal.   4. The mitral valve is grossly normal. Trivial mitral valve  regurgitation.   5. The aortic valve is tricuspid. Aortic valve regurgitation is not  visualized.   6. The inferior vena cava is normal in size with greater than 50%  respiratory variability, suggesting right atrial pressure of 3 mmHg.   Cardiac Cath 11/29/19  LV end diastolic pressure is normal.   1. Normal coronary anatomy 2. Normal LV EDP      Neuro/Psych neg Seizures Parkinson  negative psych ROS   GI/Hepatic negative GI ROS, Neg liver ROS,,,  Endo/Other  diabetes, Well Controlled, Type 2, Oral Hypoglycemic Agents  Obesity HLD  Graves  Renal/GU Renal diseaseHx/o renal calculi Lab Results       Component                Value               Date                      NA                       136                 07/07/2024                CL                       103                 07/07/2024                K                        4.1                 07/07/2024                CO2  24                  07/07/2024                BUN                      13                  07/07/2024                CREATININE               0.85                07/07/2024                GFRNONAA                 >60                 07/07/2024                CALCIUM                  9.5                 07/07/2024                ALBUMIN                   4.0                 12/05/2022                GLUCOSE                  111 (H)             07/07/2024              Hx of prostate cancer    Musculoskeletal  (+) Arthritis , Osteoarthritis and Rheumatoid disorders,  Left humeral shaft fracture   Abdominal  (+) + obese  Peds  Hematology Lab Results      Component                Value               Date                      WBC                      6.3                 07/07/2024                HGB                      13.2                07/07/2024                HCT                      41.1                07/07/2024                MCV  94.3                07/07/2024                PLT                      234                 07/07/2024              Anesthesia Other Findings   Reproductive/Obstetrics                              Anesthesia Physical Anesthesia Plan  ASA: 3  Anesthesia Plan: General and Regional   Post-op Pain Management: Regional block* and Tylenol  PO (pre-op)*   Induction: Intravenous  PONV Risk Score and Plan: 3 and Treatment may vary due to age or medical condition, Ondansetron , Dexamethasone  and Midazolam   Airway Management Planned: Oral ETT  Additional Equipment: None  Intra-op Plan:    Post-operative Plan: Extubation in OR  Informed Consent: I have reviewed the patients History and Physical, chart, labs and discussed the procedure including the risks, benefits and alternatives for the proposed anesthesia with the patient or authorized representative who has indicated his/her understanding and acceptance.     Dental advisory given  Plan Discussed with: CRNA and Anesthesiologist  Anesthesia Plan Comments: (See PAT note from 11/20)         Anesthesia Quick Evaluation  "

## 2024-08-15 NOTE — Anesthesia Procedure Notes (Signed)
 Procedure Name: Intubation Date/Time: 08/15/2024 3:07 PM  Performed by: Mollie Olivia SAUNDERS, CRNAPre-anesthesia Checklist: Patient identified, Emergency Drugs available, Suction available and Patient being monitored Patient Re-evaluated:Patient Re-evaluated prior to induction Oxygen  Delivery Method: Circle system utilized Preoxygenation: Pre-oxygenation with 100% oxygen  Induction Type: IV induction Ventilation: Two handed mask ventilation required Laryngoscope Size: Glidescope and 4 Grade View: Grade II Tube type: Oral Tube size: 7.5 mm Number of attempts: 1 Airway Equipment and Method: Rigid stylet and Video-laryngoscopy Placement Confirmation: ETT inserted through vocal cords under direct vision, positive ETCO2 and breath sounds checked- equal and bilateral Secured at: 22 cm Tube secured with: Tape Dental Injury: Teeth and Oropharynx as per pre-operative assessment  Difficulty Due To: Difficulty was anticipated, Difficult Airway- due to reduced neck mobility and Difficult Airway- due to anterior larynx

## 2024-08-15 NOTE — Progress Notes (Signed)
 " PROGRESS NOTE    Willie Howe  FMW:[id number] DOB: March 25, 1961 DOA: 08/12/2024 PCP: Leila Lucie LABOR, MD  Subjective: No acute events since admission . Seen and examined at bedside. Reports having ongoing pain at L shoulder fracture site. Tolerating oral intake without n/v. Denies constipation.     Hospital Course:  As per H&P: 63 y.o. male with medical history significant of DVT, Graves', Parkinson's, rheumatoid arthritis who presented after a fall.  Patient fell down steps and landed on his left arm.  He developed severe pain so presented to the ER for further assessment.  On arrival he was afebrile and hemodynamically stable.  Labs were obtained which showed WBC 9.7, hemoglobin 12.3, BMP unrevealing.  X-ray of chest showed no acute abnormalities.  X-ray of humerus showed acute humeral shaft fracture.  Patient was admitted further workup.  Orthopedics was consulted.  Due to persistent pain patient was admitted for pain control.   Assessment and Plan:  Acute on chronic pain Left shoulder fracture - Patient presented after a fall found to have a periprosthetic humeral fracture -Orthopedics consulted with plans for outpatient surgery - hold and discontinue home norco PRN - cont acetaminophen  1000mg  TID - cont home gabapentin  100mg  TID - cont home oxycodone  5mg  PRN for moderate pain - cont oxycodone  10mg  PRN or hydromorphone  0.5mg  IV PRN for severe pain  - hold and discontinue home docusate - cont senna BID, miralax  daily PRN to avoid opioid induced constipation - plan for orthopedic surgery 12/29   Hx of L shoulder septic arthritis Continue p.o. Doxy for 6 weeks until January 7 Stop IV ceftriaxone  as per ID recs  Will remove PICC line FU outpatient with ortho outpatient Follow up with ID as scheduled on 09/05/24   Hypertension-continue amlodipine    RLS-continue Requip    BPH-continue Flomax    CAD-continue aspirin   DVT prophylaxis: SCDs Start: 08/12/24 2106  SCDs   Code  Status: Full Code Family Communication: updated wife at bedside Disposition Plan: Home with family assistance Reason for continuing need for hospitalization: orthopedic procedure pending, pain control, needs PICC line removed thereafter  Objective: Vitals:   08/15/24 1203 08/15/24 1334 08/15/24 1339 08/15/24 1344  BP:  (!) 182/86 (!) 172/94 (!) 149/90  Pulse:  87 84 87  Resp:  17 (!) 23 (!) 21  Temp:      TempSrc:      SpO2:  98% 98% 97%  Weight: 117.9 kg     Height:        Intake/Output Summary (Last 24 hours) at 08/15/2024 1552 Last data filed at 08/15/2024 1534 Gross per 24 hour  Intake 810 ml  Output --  Net 810 ml   Filed Weights   08/15/24 1203  Weight: 117.9 kg    Examination:  Physical Exam Vitals and nursing note reviewed.  Constitutional:      General: He is not in acute distress.    Appearance: He is obese. He is ill-appearing.     Comments: Weak, frail  HENT:     Head: Normocephalic and atraumatic.  Cardiovascular:     Rate and Rhythm: Normal rate and regular rhythm.     Pulses: Normal pulses.     Heart sounds: Normal heart sounds.  Pulmonary:     Effort: Pulmonary effort is normal.     Breath sounds: Normal breath sounds.  Abdominal:     General: Bowel sounds are normal.     Palpations: Abdomen is soft.  Neurological:  Mental Status: He is alert.     Data Reviewed: I have personally reviewed following labs and imaging studies  CBC: Recent Labs  Lab 08/12/24 1936  WBC 9.7  NEUTROABS 6.3  HGB 12.3*  HCT 37.7*  MCV 91.3  PLT 269   Basic Metabolic Panel: Recent Labs  Lab 08/12/24 1936  NA 137  K 4.0  CL 104  CO2 23  GLUCOSE 112*  BUN 13  CREATININE 0.67  CALCIUM 9.3   GFR: Estimated Creatinine Clearance: 128.4 mL/min (by C-G formula based on SCr of 0.67 mg/dL). Liver Function Tests: No results for input(s): AST, ALT, ALKPHOS, BILITOT, PROT, ALBUMIN  in the last 168 hours. No results for input(s): LIPASE,  AMYLASE in the last 168 hours. No results for input(s): AMMONIA in the last 168 hours. Coagulation Profile: No results for input(s): INR, PROTIME in the last 168 hours. Cardiac Enzymes: No results for input(s): CKTOTAL, CKMB, CKMBINDEX, TROPONINI in the last 168 hours. ProBNP, BNP (last 5 results) No results for input(s): PROBNP, BNP in the last 8760 hours. HbA1C: No results for input(s): HGBA1C in the last 72 hours. CBG: Recent Labs  Lab 08/12/24 1727 08/15/24 1155  GLUCAP 120* 113*   Lipid Profile: No results for input(s): CHOL, HDL, LDLCALC, TRIG, CHOLHDL, LDLDIRECT in the last 72 hours. Thyroid  Function Tests: No results for input(s): TSH, T4TOTAL, FREET4, T3FREE, THYROIDAB in the last 72 hours. Anemia Panel: No results for input(s): VITAMINB12, FOLATE, FERRITIN, TIBC, IRON, RETICCTPCT in the last 72 hours. Sepsis Labs: No results for input(s): PROCALCITON, LATICACIDVEN in the last 168 hours.  Recent Results (from the past 240 hours)  Surgical pcr screen     Status: None   Collection Time: 08/15/24  7:00 AM   Specimen: Nasal Mucosa; Nasal Swab  Result Value Ref Range Status   MRSA, PCR NEGATIVE NEGATIVE Final   Staphylococcus aureus NEGATIVE NEGATIVE Final    Comment: (NOTE) The Xpert SA Assay (FDA approved for NASAL specimens in patients 38 years of age and older), is one component of a comprehensive surveillance program. It is not intended to diagnose infection nor to guide or monitor treatment. Performed at [facility], [address] [place], [place] [address]      Radiology Studies: No results found.  Scheduled Meds:  [MAR Hold] acetaminophen   1,000 mg Oral TID   acetaminophen   1,000 mg Oral Once   [MAR Hold] amLODipine   5 mg Oral Daily   [MAR Hold] aspirin   81 mg Oral BID   [MAR Hold] Chlorhexidine  Gluconate Cloth  6 each Topical Daily   [MAR Hold] doxycycline   100 mg Oral BID   [MAR  Hold] folic acid   1 mg Oral Daily   [MAR Hold] gabapentin   100 mg Oral TID   gabapentin   300 mg Oral Once   [MAR Hold] rOPINIRole   1.5 mg Oral TID   [MAR Hold] sodium chloride  flush  10-40 mL Intracatheter Q12H   [MAR Hold] tamsulosin   0.4 mg Oral QHS   Continuous Infusions:  lactated ringers  Stopped (08/15/24 1541)     LOS: 2 days   Willie Bar, MD  [facility]  08/15/2024, 3:52 PM   "

## 2024-08-15 NOTE — Anesthesia Procedure Notes (Signed)
 Anesthesia Regional Block: Interscalene brachial plexus block   Pre-Anesthetic Checklist: , timeout performed,  Correct Patient, Correct Site, Correct Laterality,  Correct Procedure, Correct Position, site marked,  Risks and benefits discussed,  Surgical consent,  Pre-op evaluation,  At surgeon's request and post-op pain management  Laterality: Left  Prep: chloraprep       Needles:  Injection technique: Single-shot  Needle Type: Echogenic Stimulator Needle     Needle Length: 9cm  Needle Gauge: 21     Additional Needles:   Procedures:,,,, ultrasound used (permanent image in chart),,    Narrative:  Start time: 08/15/2024 1:50 PM End time: 08/15/2024 1:55 PM Injection made incrementally with aspirations every 5 mL.  Performed by: Personally  Anesthesiologist: Erma Thom SAUNDERS, MD  Additional Notes: Discussed risks and benefits of the nerve block in detail, including but not limited vascular injury, permanent nerve damage and infection.   Patient tolerated the procedure well. Local anesthetic introduced in an incremental fashion under minimal resistance after negative aspirations. No paresthesias were elicited. After completion of the procedure, no acute issues were identified and patient continued to be monitored by RN.

## 2024-08-15 NOTE — Progress Notes (Signed)
 OT Cancellation Note  Patient Details Name: Willie Howe MRN: [id number] DOB: 1961/07/13   Cancelled Treatment:    Reason Eval/Treat Not Completed: Patient at procedure or test/ unavailable. Pt off floor this date for ORIF. OT to follow up as appropriate and schedule allows.  Maurilio CROME, OTR/LSABRA  [facility]  Office: [phone]   [name] 08/15/2024, 4:53 PM

## 2024-08-15 NOTE — Anesthesia Postprocedure Evaluation (Signed)
"   Anesthesia Post Note  Patient: Willie Howe  Procedure(s) Performed: OPEN REDUCTION INTERNAL FIXATION (ORIF) HUMERAL SHAFT FRACTURE (Left)     Patient location during evaluation: PACU Anesthesia Type: Regional and MAC Level of consciousness: awake and alert Pain management: pain level controlled Vital Signs Assessment: post-procedure vital signs reviewed and stable Respiratory status: spontaneous breathing, nonlabored ventilation, respiratory function stable and patient connected to nasal cannula oxygen  Cardiovascular status: stable and blood pressure returned to baseline Postop Assessment: no apparent nausea or vomiting Anesthetic complications: no   No notable events documented.  Last Vitals:  Vitals:   08/15/24 1339 08/15/24 1344  BP: (!) 172/94 (!) 149/90  Pulse: 84 87  Resp: (!) 23 (!) 21  Temp:    SpO2: 98% 97%    Last Pain:  Vitals:   08/15/24 1159  TempSrc:   PainSc: 6    Pain Goal: Patients Stated Pain Goal: 0 (08/15/24 9381)                 [name]      "

## 2024-08-15 NOTE — Telephone Encounter (Signed)
 P acnes grow  So just doxy alone is fine  Thanks Q

## 2024-08-15 NOTE — Op Note (Signed)
 Orthopaedic Surgery Operative Note (CSN: [id number])  Willie Howe  Sep 14, 1960 Date of Surgery: 08/12/2024 - 08/15/2024   Diagnoses:  Left periprosthetic humerus fracture  Procedure: Left open reduction internal fixation periprosthetic humerus fracture modifier 22  - 24515   Operative Finding Successful completion of the planned procedure.  Patient had a long history of the shoulder with previous revision.  His stem was quite large with which made it difficult to obtain good purchase.  It was unclear that there was a butterfly fragment posteriorly until the plate was already in place.  I felt that removing the plate and trying to extend the plate would not be in the patient's best interest.  We did 7 unicortical screws distal to the fracture site and a cerclage cable which I felt was appropriate fixation.  This was extremely difficult case not reflected in the standard coding secondary to the nature of the periprosthetic fracture, the surgical time and the complexity of passing cerclage cables.  Post-operative plan: The patient will be admitted for nonorthopedic related reasons while being seen by infectious disease, can be discharged as soon as primary team sees fit.  The patient will be nonweightbearing in a sling for 4 weeks without therapy.  DVT prophylaxis Aspirin  81 mg twice daily for 6 weeks.   Pain control with PRN pain medication preferring oral medicines.  Follow up plan will be scheduled in approximately 7 days for incision check and XR.  Post-Op Diagnosis: Same Surgeons:Primary: Cristy Bonner DASEN, MD Assistants:Caroline McBane, PA-C Location: [facility] OR ROOM 05 Anesthesia: General with regional anesthesia Antibiotics: Ancef  2 g with local vancomycin  powder 1 g at the surgical site Tourniquet time:  Estimated Blood Loss: 200 Complications: None Specimens: None Implants: Implant Name Type Inv. Item Serial No. Manufacturer Lot No. LRB No. Used Action  PLATE BONE EVOS 3.5X180 6H -  [id number] Plate PLATE BONE EVOS 3.5X180 6H  [employer]  Left 1 Implanted  SCREW CORT ST EVOS 4.5X28 - [id number] Screw SCREW CORT ST EVOS 4.5X28  [employer]  Left 1 Implanted  SCREW EVOS S-T CTX ST 4.5X32 - [id number] Screw SCREW EVOS S-T CTX ST 4.5X32  [employer]  Left 1 Implanted  cortical screw 12mm    [employer]  Left 1 Implanted  14mm locking screw    [employer]  Left 4 Implanted  10mm 3.5 locking screw    [employer]  Left 5 Implanted  SCREW CORT ST EVOS 4.5X28 - [id number] Screw SCREW CORT ST EVOS 4.5X28  [employer]  Left 1 Implanted  SCREW CORT ST EVOS 4.5X26 - [id number] Screw SCREW CORT ST EVOS 4.5X26  [employer]  Left 1 Implanted  12mm 3.5 locking screw    [employer]  Left 1 Implanted  3.6mm cable post     [id number] Left 1 Implanted    Indications for Surgery:   Willie Howe is a 63 y.o. male with periprosthetic humerus fracture.  Benefits and risks of operative and nonoperative management were discussed prior to surgery with patient/guardian(s) and informed consent form was completed.  Specific risks including infection, need for additional surgery, nonunion, malunion, fracture reduction loss   Procedure:   The patient was identified properly. Informed consent was obtained and the surgical site was marked. The patient was taken up to suite where general anesthesia was induced.  The patient was positioned beachchair on [employer]  table.  The left shoulder was prepped and draped in the usual sterile fashion.  Timeout was performed before the beginning of the case.  Tourniquet was used for the above duration.  Began by extending the patient's deltopectoral incision.  Went through skin sharply to hemostasis we progressed.  We extended it distally taking the biceps medially and identifying the deltoid insertion.  I was able to  identify the fracture site and though there was stripping in this area and we opened the brachialis in line with the side of the fracture dissection.  I was then able to identify that there was some comminution at the fracture site and was not amenable to lag screw fixation.  We then were able to clear hematoma and anatomically reduce the fracture.  We selected a [employer] utility plate and were able to obtain 5 screws distal to the fracture site and 7 unicortical screws proximal.  We primarily used unicortical locking screws proximally to avoid pull-through as the stem blocked bicortical purchase.  I additionally carefully passed a fiber tape cerclage from [employer] directly on bone at the proximal end of the plate using a saddle type device to keep it from sliding distally.  This provided good compression of bone to plate.  We ensured we were directly on bone as he passed the suture.  We irrigated the wound copiously before placing local antibiotic as listed above.  We closed the incision in a multilayer fashion with absorbable suture.  Sterile dressing was placed.  Patient was awoken taken to PACU in stable condition.  Aleck Stalling, PA-C, present and scrubbed throughout the case, critical for completion in a timely fashion, and for retraction, instrumentation, closure.

## 2024-08-15 NOTE — Telephone Encounter (Signed)
 Patient is admitted, inpatient team notified.   Zalan Shidler, BSN, RN

## 2024-08-15 NOTE — Progress Notes (Signed)
. ° ° °  PROCEDURAL EXPEDITER PROGRESS NOTE  Patient Name: Willie Howe  DOB:28-May-1961 Date of Admission: 08/12/2024  Date of Assessment:08/15/2024   -------------------------------------------------------------------------------------------------------------------   Brief clinical summary: Pt to OR today for ORIF of left humeral fracture  Orders in place:  No   Communication with surgical team if no orders: IB MD  Labs, test, and orders reviewed: Y  Requires surgical clearance:  No  Barriers noted: N/A   -------------------------------------------------------------------------------------------------------------------  [facility], [place], [place] Please contact us  directly via secure chat (search for [facility]) or by calling us  at [phone] [facility]).

## 2024-08-15 NOTE — Progress Notes (Addendum)
 "        [facility]  Date of Admission:  08/12/2024       Abx: Doxy ceftriaxone   ASSESSMENT: Patient known to id service Prosthetic joint shoulder infection recurrent x1 07/13/24 At that time cx hasn't resulted and discharged on doxy/ceftriaxone   Our team have been trying to reach patient to change to just doxy  He is currently admitted for left humerus fx and underwent ORIF 12/29  Tolerating abx well    PLAN: Stop ceftriaxone  and remove picc Continue doxycycline  and will plan chronic suppression Maintain standard isolation precaution Id clinic f/u 1/19 Can discharge when cleared by primary team/surgery Discussed with primary team    I spent more than 15 minute reviewing data/chart, and coordinating care, discussing diagnostics/treatment plan with treatment team   Principal Problem:   Shoulder fracture   Allergies[1]  Scheduled Meds:  acetaminophen   1,000 mg Oral TID   amLODipine   5 mg Oral Daily   aspirin   81 mg Oral BID   Chlorhexidine  Gluconate Cloth  6 each Topical Daily   doxycycline   100 mg Oral BID   folic acid   1 mg Oral Daily   gabapentin   100 mg Oral TID   rOPINIRole   1.5 mg Oral TID   sodium chloride  flush  10-40 mL Intracatheter Q12H   tamsulosin   0.4 mg Oral QHS   Continuous Infusions: PRN Meds:.oxyCODONE  **OR** HYDROmorphone  (DILAUDID ) injection, oxyCODONE , sodium chloride  flush   SUBJECTIVE: Chart reviewed Patient underwent orif left humerus fx  On doxy/ceftriaxone    Review of Systems: ROS All other ROS was negative, except mentioned above     OBJECTIVE: Vitals:   08/15/24 1745 08/15/24 1800 08/15/24 1815 08/15/24 1958  BP: (!) 142/53 (!) 171/91 123/79 (!) 127/90  Pulse: 85 88 88 95  Resp: (!) 22 20 16 16   Temp:   97.7 F (36.5 C) 98.4 F (36.9 C)  TempSrc:    Oral  SpO2: 94% 95% 96% 98%  Weight:      Height:       Body mass index is 32.07 kg/m.  Physical Exam In OR  Lab  Results Lab Results  Component Value Date   WBC 9.7 08/12/2024   HGB 12.3 (L) 08/12/2024   HCT 37.7 (L) 08/12/2024   MCV 91.3 08/12/2024   PLT 269 08/12/2024    Lab Results  Component Value Date   CREATININE 0.67 08/12/2024   BUN 13 08/12/2024   NA 137 08/12/2024   K 4.0 08/12/2024   CL 104 08/12/2024   CO2 23 08/12/2024    Lab Results  Component Value Date   ALT 17 12/05/2022   AST 16 12/05/2022   ALKPHOS 76 12/05/2022   BILITOT 0.8 12/05/2022      Microbiology: Recent Results (from the past 240 hours)  Surgical pcr screen     Status: None   Collection Time: 08/15/24  7:00 AM   Specimen: Nasal Mucosa; Nasal Swab  Result Value Ref Range Status   MRSA, PCR NEGATIVE NEGATIVE Final   Staphylococcus aureus NEGATIVE NEGATIVE Final    Comment: (NOTE) The Xpert SA Assay (FDA approved for NASAL specimens in patients 70 years of age and older), is one component of a comprehensive surveillance program. It is not intended to diagnose infection nor to guide or monitor treatment. Performed at [facility], [address], [place], [place] [address]      Serology:   Imaging: If present, new imagings (plain films, ct  scans, and mri) have been personally visualized and interpreted; radiology reports have been reviewed. Decision making incorporated into the Impression / Recommendations.   Constance ONEIDA Passer, MD [facility] [phone] pager    08/15/2024, 8:01 PM     [1]  Allergies Allergen Reactions   Lisinopril Cough   Statins Other (See Comments)    Muscle pain   "

## 2024-08-15 NOTE — TOC CAGE-AID Note (Signed)
 Transition of Care [facility]) - CAGE-AID Screening   Patient Details  Name: Willie Howe MRN: [id number] Date of Birth: 1961-01-19  Transition of [facility] CM/SW Contact:    Rika Daughdrill E Saniah Schroeter, LCSW Phone Number: 08/15/2024, 10:53 AM   Clinical Narrative: Denied SA.   CAGE-AID Screening:    Have You Ever Felt You Ought to Cut Down on Your Drinking or Drug Use?: No Have People Annoyed You By Critizing Your Drinking Or Drug Use?: No Have You Felt Bad Or Guilty About Your Drinking Or Drug Use?: No Have You Ever Had a Drink or Used Drugs First Thing In The Morning to Steady Your Nerves or to Get Rid of a Hangover?: No CAGE-AID Score: 0  Substance Abuse Education Offered: No

## 2024-08-16 NOTE — Progress Notes (Signed)
 Physical Therapy Treatment Patient Details Name: Willie Howe MRN: [id number] DOB: 04-11-1961 Today's Date: 08/16/2024   History of Present Illness Willie Howe is a 63 yo male who presented after a fall down 5 steps with immediate L arm pain. Xray showed periprosthetic humeral shaft fracture. S/p L ORIF 12/29. PMHx: 11/26 L shoulder arthroplasty revision, DVT, Graves', Parkinson's, rheumatoid arthritis    PT Comments  Pt reassessed s/p ORIF. Pt reports good pain control and overall he is mobilizing well. Prefers use of SPC for back pain. Pt ambulating 200 ft with SPC and negotiated 6 steps with a R railing modI. Demonstrates good adherence to weightbearing precautions. No further acute PT needs. Thank you for this consult.    If plan is discharge home, recommend the following: A little help with bathing/dressing/bathroom;Assist for transportation;Assistance with cooking/housework   Can travel by private vehicle        Equipment Recommendations  None recommended by PT    Recommendations for Other Services       Precautions / Restrictions Precautions Precautions: Fall Recall of Precautions/Restrictions: Intact Required Braces or Orthoses: Sling Restrictions Weight Bearing Restrictions Per Provider Order: Yes LUE Weight Bearing Per Provider Order: Non weight bearing Other Position/Activity Restrictions: okay for wrist ROM, no ROM at elbow or shoulder.     Mobility  Bed Mobility               General bed mobility comments: Sitting up in chair upon arrival    Transfers Overall transfer level: Independent Equipment used: None               General transfer comment: Pushing up with RUE to power up to stand    Ambulation/Gait Ambulation/Gait assistance: Modified independent (Device/Increase time) Gait Distance (Feet): 200 Feet Assistive device: None, Straight cane Gait Pattern/deviations: Step-through pattern       General Gait Details: Min verbal cues  for upright posture, environmental awareness   Stairs Stairs: Yes Stairs assistance: Modified independent (Device/Increase time) Stair Management: One rail Right Number of Stairs: 6     Wheelchair Mobility     Tilt Bed    Modified Rankin (Stroke Patients Only)       Balance Overall balance assessment: No apparent balance deficits (not formally assessed)                                          Communication Communication Communication: No apparent difficulties  Cognition Arousal: Alert Behavior During Therapy: WFL for tasks assessed/performed   PT - Cognitive impairments: No apparent impairments                         Following commands: Intact      Cueing Cueing Techniques: Verbal cues, Visual cues  Exercises      General Comments General comments (skin integrity, edema, etc.): Sling donned and adjusted to adequately support LUE. Educated Pt on NWB precaution and ROM restrictions. Educated on implications of precautions on functional tasks.      Pertinent Vitals/Pain Pain Assessment Pain Assessment: Faces Faces Pain Scale: Hurts a little bit Pain Location: LUE Pain Descriptors / Indicators: Guarding Pain Intervention(s): Monitored during session    Home Living                          Prior  Function            PT Goals (current goals can now be found in the care plan section) Acute Rehab PT Goals Potential to Achieve Goals: Good Progress towards PT goals: Goals met/education completed, patient discharged from PT    Frequency           PT Plan      Co-evaluation              AM-PAC PT 6 Clicks Mobility   Outcome Measure  Help needed turning from your back to your side while in a flat bed without using bedrails?: None Help needed moving from lying on your back to sitting on the side of a flat bed without using bedrails?: None Help needed moving to and from a bed to a chair (including a  wheelchair)?: None Help needed standing up from a chair using your arms (e.g., wheelchair or bedside chair)?: None Help needed to walk in hospital room?: None Help needed climbing 3-5 steps with a railing? : None 6 Click Score: 24    End of Session Equipment Utilized During Treatment: Other (comment) (sling) Activity Tolerance: Patient tolerated treatment well Patient left: in chair;with call bell/phone within reach   PT Visit Diagnosis: Unsteadiness on feet (R26.81)     Time: 8981-8967 PT Time Calculation (min) (ACUTE ONLY): 14 min  Charges:    $Therapeutic Activity: 8-22 mins PT General Charges $$ ACUTE PT VISIT: 1 Visit                     Willie Howe, PT, DPT Acute Rehabilitation Services Office [phone]    Willie Willie Howe 08/16/2024, 11:30 AM

## 2024-08-16 NOTE — Progress Notes (Signed)
 "  ORTHOPAEDIC PROGRESS NOTE  s/p Procedures: OPEN REDUCTION INTERNAL FIXATION (ORIF) HUMERAL SHAFT FRACTURE  SUBJECTIVE: Reports mild pain about operative site. Nerve block is starting to wear off. Had some issues with bleeding from his bandage. No other complaints.  OBJECTIVE: PE: General: sitting up in hospital bed, NAD LUE: aquacel saturated with blood. Aquacel removed. No active drainage. New dressing was placed. + Motor in  AIN, PIN, Ulnar distributions. Axillary nerve sensation altered in setting of block.  Sensation intact but slightly altered compared to contralateral side in setting of nerve block. in medial, radial, and ulnar distributions. Well perfused digits.    Vitals:   08/16/24 0423 08/16/24 0746  BP: 134/78 131/87  Pulse: 79 97  Resp: 17 18  Temp: 98.4 F (36.9 C) 98.2 F (36.8 C)  SpO2: 97% 98%    Opiates Today (MME): Today's  total administered Morphine  Milligram Equivalents: 30 Opiates Yesterday (MME): Yesterday's total administered Morphine  Milligram Equivalents: 135 Opiates Used in the last two days:  Inpatient Morphine  Milligram Equivalents Per Day 12/26 - 12/30   Values displayed are in units of MME/Day    Order Start / End Date 12/26 12/27 12/28  Yesterday Today    oxyCODONE  (Oxy IR/ROXICODONE ) immediate release tablet 10 mg 12/27 - No end date -- 30 of 60 60 of 90 45 of 90 30 of 90    HYDROmorphone  (DILAUDID ) injection 0.5 mg 12/27 - No end date -- 20 of 40 10 of 60 0 of 60 0 of 60       Group total: 50 of 40-60 Group total: 70 of 60-90 Group total: 45 of 60-90 Group total: 30 of 60-90    oxyCODONE  (Oxy IR/ROXICODONE ) immediate release tablet 5 mg 12/29 - 12/29 -- -- -- 0 of Unknown --    oxyCODONE  (ROXICODONE ) 5 MG/5ML solution 5 mg 12/29 - 12/29 -- -- -- 0 of Unknown --         Group total: 0 of Unknown     fentaNYL  (SUBLIMAZE ) injection 50 mcg 12/26 - 12/26 15 of 15 -- -- -- --    HYDROmorphone  (DILAUDID ) injection 1 mg 12/26 - 12/26 20 of 20 -- --  -- --    HYDROmorphone  (DILAUDID ) injection 0.5 mg 12/26 - 12/26 10 of 10 -- -- -- --    oxyCODONE  (Oxy IR/ROXICODONE ) immediate release tablet 5 mg 12/26 - 12/27 7.5 of 7.5 7.5 of 15 -- -- --    HYDROmorphone  (DILAUDID ) injection 0.5-1 mg 12/26 - 12/27 20 of 20-40 60 of 60-120 -- -- --    oxyCODONE  (Oxy IR/ROXICODONE ) immediate release tablet 5 mg 12/27 - No end date -- 0 of 22.5 0 of 30 0 of 30 0 of 30    fentaNYL  (SUBLIMAZE ) injection 25-50 mcg 12/29 - 12/29 -- -- -- 30 of 45-90 --    fentaNYL  (SUBLIMAZE ) 100 MCG/2ML injection 12/29 - 12/29 -- -- -- 0 of Unknown --    fentaNYL  (SUBLIMAZE ) injection 100 mcg 12/29 - 12/29 -- -- -- 30 of 30 --    fentaNYL  citrate (PF) (SUBLIMAZE ) injection 12/29 - 12/29 -- -- -- *30 of 30 --    Daily Totals  72.5 of 72.5-92.5 117.5 of 137.5-217.5 70 of 90-120 * 135 of Unknown (at least 195-270) 30 of 90-120  *One-Step medication  Calculation Errors     Order Type Date Details   oxyCODONE  (Oxy IR/ROXICODONE ) immediate release tablet 5 mg Ordered Dose -- Insufficient frequency information   oxyCODONE  (ROXICODONE ) 5  MG/5ML solution 5 mg Ordered Dose -- Insufficient frequency information   fentaNYL  (SUBLIMAZE ) 100 MCG/2ML injection Ordered Dose -- Frequency type could not be determined           Stable post-op images  ASSESSMENT: Willie Howe is a 63 y.o. male POD#1  PLAN: Weightbearing: NWB LUE Insicional and dressing care: Reinforce dressings as needed Orthopedic device(s): Sling Showering: post-op day #2 with assistance VTE prophylaxis: Aspirin  81 mg BID x 6 weeks at discharge Pain control: PRN pain medication, minimize narcotics as able Dispo: patient okay to discharge home from orthopedic standpoint. Discharge medications sent to patient's pharmacy yesterday Follow - up plan: 1 week in office Contact information:  Weekdays 8-5 Dr. Bonner Hair, Aleck Stalling PA-C, After hours and holidays please check [email or web address] for group call information for  [facility]  Aleck Stalling, PA-C 08/16/24   "

## 2024-08-16 NOTE — Progress Notes (Signed)
 Occupational Therapy Treatment Patient Details Name: Willie Howe MRN: [id number] DOB: [birth year] Today's Date: 08/16/2024   History of present illness Willie Howe is a 63 yo male who presented after a fall down 5 steps with immediate L arm pain. Xray showed periprosthetic humeral shaft fracture. S/p L ORIF 12/29. PMHx: 11/26 L shoulder arthroplasty revision, DVT, Graves', Parkinson's, rheumatoid arthritis   OT comments  Pt progressing well towards OT goals. Pt now s/p L ORIF. Focus of session on progressing functional mobility and increasing safe engagement in ADL tasks within LUE precautions. Pt educated on functional implications of LUE shoulder precautions on ADL tasks and verbalized understanding, expressing adequate support at home from wife and daughter. Pt Mod I for functional mobility around room. OT to continue per POC. Anticipate that Pt safe to d/c home, following physician recommendations for post-op care, with level of assist as outlined below once medically cleared.       If plan is discharge home, recommend the following:  A little help with walking and/or transfers;A lot of help with bathing/dressing/bathroom;Assistance with cooking/housework;Assist for transportation;Help with stairs or ramp for entrance   Equipment Recommendations  Tub/shower seat    Recommendations for Other Services      Precautions / Restrictions Precautions Precautions: Fall Recall of Precautions/Restrictions: Intact Required Braces or Orthoses: Sling Restrictions Weight Bearing Restrictions Per Provider Order: Yes Other Position/Activity Restrictions: okay for wrist ROM, no ROM at elbow or shoulder.       Mobility Bed Mobility Overal bed mobility: Modified Independent             General bed mobility comments: Mod I with increased time, no physical assist needed    Transfers Overall transfer level: Modified independent Equipment used: None               General  transfer comment: Pt mobilized around room without physical assistance or DME. Good environmental awareness and bracing with RUE when sitting in recliner     Balance Overall balance assessment: No apparent balance deficits (not formally assessed)                                         ADL either performed or assessed with clinical judgement   ADL Overall ADL's : Needs assistance/impaired         Upper Body Bathing: Moderate assistance       Upper Body Dressing : Moderate assistance                     General ADL Comments: Discussed bathing and dressing compensatory strategies within ROM and WB restrictions. Pt verbalized understanding and endorsed adequate support at home from wife and daughter that is a engineer, civil (consulting).    Extremity/Trunk Assessment Upper Extremity Assessment Upper Extremity Assessment: LUE deficits/detail LUE Deficits / Details: S/p ORIF. Wrist and hand ROM WFL. Nerve block beginning to wear off. Serial opposition WFL LUE: Unable to fully assess due to immobilization LUE Sensation: WNL (nerve block decreasing) LUE Coordination: decreased gross motor            Vision   Vision Assessment?: No apparent visual deficits   Perception     Praxis     Communication Communication Communication: No apparent difficulties   Cognition Arousal: Alert Behavior During Therapy: WFL for tasks assessed/performed Cognition: No apparent impairments  Following commands: Intact        Cueing   Cueing Techniques: Verbal cues, Visual cues  Exercises Exercises: Hand exercises Hand Exercises Wrist Flexion: AROM, Left Wrist Extension: AROM, Left Digit Composite Flexion: AROM, Left Composite Extension: AROM, Left    Shoulder Instructions       General Comments Sling donned and adjusted to adequately support LUE. Educated Pt on NWB precaution and ROM restrictions. Educated on implications of  precautions on functional tasks.    Pertinent Vitals/ Pain       Pain Assessment Pain Assessment: Faces Faces Pain Scale: Hurts a little bit Pain Location: LUE Pain Descriptors / Indicators: Guarding Pain Intervention(s): Limited activity within patient's tolerance, Monitored during session, Premedicated before session  Home Living                                          Prior Functioning/Environment              Frequency  Min 3X/week        Progress Toward Goals  OT Goals(current goals can now be found in the care plan section)  Progress towards OT goals: Progressing toward goals  Acute Rehab OT Goals Patient Stated Goal: to get home OT Goal Formulation: With patient/family Time For Goal Achievement: 08/27/24 Potential to Achieve Goals: Good ADL Goals Pt Will Perform Upper Body Dressing: with min assist Pt Will Perform Lower Body Dressing: with min assist Pt Will Transfer to Toilet: with modified independence  Plan      Co-evaluation                 AM-PAC OT 6 Clicks Daily Activity     Outcome Measure   Help from another person eating meals?: A Lot Help from another person taking care of personal grooming?: A Lot Help from another person toileting, which includes using toliet, bedpan, or urinal?: A Little Help from another person bathing (including washing, rinsing, drying)?: A Lot Help from another person to put on and taking off regular upper body clothing?: A Lot Help from another person to put on and taking off regular lower body clothing?: A Lot 6 Click Score: 13    End of Session Equipment Utilized During Treatment:  (LUE sling)  OT Visit Diagnosis: Muscle weakness (generalized) (M62.81);History of falling (Z91.81);Pain   Activity Tolerance Patient tolerated treatment well   Patient Left in chair;with call bell/phone within reach   Nurse Communication          Time: 9158-9147 OT Time Calculation (min): 11  min  Charges: OT General Charges $OT Visit: 1 Visit OT Treatments $Self Care/Home Management : 8-22 mins  Willie Howe, OTR/L.  [facility] Acute Rehabilitation  Office: [phone]   Willie Howe 08/16/2024, 9:04 AM

## 2024-08-16 NOTE — Progress Notes (Signed)
 Discharge summary ( AVS) provided to pt with instructions. Pt verbalized understanding of instructions. Pt d/c to home as ordered. Per Pt his daughter would  be responsible for his ride. And pt is ok to be d/c'd at the lounge while waiting for a ride. He remains alert/oriented in no apparent distress.

## 2024-08-16 NOTE — Progress Notes (Signed)
 Orthopedic Tech Progress Note Patient Details:  Willie Howe Mar 25, 1961 [id number]  Patient ID: Willie Howe Willie Howe, male   DOB: 05-13-1961, 63 y.o.   MRN: [id number] I delivered a replacement sling immobilizer to the pts RN due to the old one being soiled with a lot of blood. Chandra Dorn PARAS 08/16/2024, 6:07 AM

## 2024-08-16 NOTE — Care Management Important Message (Signed)
 Important Message  Patient Details  Name: Willie Howe MRN: [id number] Date of Birth: 10/25/1960   Important Message Given:  Yes - Medicare IM     Jennie Laneta Dragon 08/16/2024, 1:11 PM

## 2024-08-16 NOTE — Plan of Care (Signed)
  Problem: Pain Managment: Goal: General experience of comfort will improve and/or be controlled Outcome: Progressing   Problem: Safety: Goal: Ability to remain free from injury will improve Outcome: Progressing

## 2024-08-17 ENCOUNTER — Ambulatory Visit: Admitting: Physical Therapy

## 2024-08-19 ENCOUNTER — Encounter (HOSPITAL_COMMUNITY): Payer: Self-pay | Admitting: Orthopaedic Surgery

## 2024-08-22 ENCOUNTER — Inpatient Hospital Stay: Payer: Self-pay | Admitting: Internal Medicine

## 2024-08-30 ENCOUNTER — Encounter (HOSPITAL_COMMUNITY): Payer: Self-pay | Admitting: Orthopaedic Surgery

## 2024-09-05 ENCOUNTER — Other Ambulatory Visit: Payer: Self-pay

## 2024-09-05 ENCOUNTER — Ambulatory Visit: Admitting: Internal Medicine

## 2024-09-05 ENCOUNTER — Encounter: Payer: Self-pay | Admitting: Internal Medicine

## 2024-09-05 VITALS — BP 132/77 | HR 82 | Temp 97.5°F | Ht 75.5 in | Wt 262.0 lb

## 2024-09-05 DIAGNOSIS — T8459XD Infection and inflammatory reaction due to other internal joint prosthesis, subsequent encounter: Secondary | ICD-10-CM

## 2024-09-05 DIAGNOSIS — T8450XD Infection and inflammatory reaction due to unspecified internal joint prosthesis, subsequent encounter: Secondary | ICD-10-CM

## 2024-09-05 MED ORDER — DOXYCYCLINE HYCLATE 100 MG PO TABS: 100.0000 mg | ORAL_TABLET | Freq: Two times a day (BID) | ORAL | 11 refills | Status: AC

## 2024-09-05 NOTE — Progress Notes (Signed)
 "       [facility]  Patient Active Problem List   Diagnosis Date Noted   Shoulder fracture 08/12/2024   Infection of prosthetic shoulder joint 09/26/2021   PICC (peripherally inserted central catheter) in place 09/26/2021   Status post reverse total replacement of left shoulder 09/11/2021   Rotator cuff arthropathy of left shoulder 11/08/2020   Abnormal feces 08/30/2020   Chronic idiopathic constipation 08/30/2020   Colon cancer screening 08/30/2020   Family history of malignant neoplasm of gastrointestinal tract 08/30/2020   History of colonic polyps 08/30/2020   Chest pain 11/28/2019   Symptomatic bradycardia 11/28/2019   Nontraumatic complete tear of left rotator cuff    Hypertension 08/31/2019   S/P left rotator cuff repair/ biceps tenodesis 02/15/19  03/01/2019   Labral tear of long head of left biceps tendon    Partial tear of left subscapularis tendon    Traumatic complete tear of left rotator cuff    Family history of malignant neoplasm of prostate 07/28/2018   History of Graves' disease 11/11/2017   S/P total knee replacement, right 02/02/15 08/12/2017   S/P total knee replacement, left 07/24/16 08/12/2017   Hypercholesterolemia 11/08/2015   History of DVT (deep vein thrombosis) 11/04/2015   History of cocaine abuse (HCC) 11/04/2015   History of heroin abuse (HCC) 11/04/2015   Obesity 11/04/2015   Parasomnia 11/04/2015   Benign paroxysmal positional vertigo 10/29/2015   Controlled type 2 diabetes mellitus without complication (HCC) 10/29/2015   Elevated prostate specific antigen (PSA) 10/29/2015   HX: long term anticoagulant use 10/29/2015   Sensorineural hearing loss 10/29/2015   Sleep apnea with use of continuous positive airway pressure (CPAP) 10/29/2015   Chronic radicular lumbar pain 10/29/2015   Arthrofibrosis of total knee arthroplasty    Pulmonary embolus (HCC) 01/09/2015   History of pulmonary embolism 01/09/2015   Rheumatoid  arthritis (HCC) 02/21/2014   Lumbar spinal stenosis 02/10/2014      Subjective:    Patient ID: Willie Howe, male    DOB: 1960-10-28, 64 y.o.   MRN: [id number]  Chief Complaint  Patient presents with   Hospitalization Follow-up    HPI:  [name] is a 64 y.o. male here for f/u recurrent left prosthetic joint infection  Last recurrence 07/13/24 s/p I&D Cx grew p-acnes  On doxycycline   Doing well 5/10 pain chronic Nausea  No other complaint   Allergies[1]    Outpatient Medications Prior to Visit  Medication Sig Dispense Refill   amLODipine  (NORVASC ) 10 MG tablet Take 5 mg by mouth daily.     aspirin  (ASPIRIN  CHILDRENS) 81 MG chewable tablet Chew 1 tablet (81 mg total) by mouth 2 (two) times daily. For 6 weeks for DVT prophylaxis after surgery 84 tablet 0   celecoxib  (CELEBREX ) 100 MG capsule Take 1 capsule (100 mg total) by mouth 2 (two) times daily. For 2 weeks. Then take as needed 60 capsule 0   folic acid  (FOLVITE ) 1 MG tablet Take 1 mg by mouth daily.     gabapentin  (NEURONTIN ) 100 MG capsule Take 1 capsule (100 mg total) by mouth 3 (three) times daily. For pain 90 capsule 0   loratadine  (CLARITIN ) 10 MG tablet Take 10 mg by mouth daily.     metFORMIN  (GLUCOPHAGE -XR) 500 MG 24 hr tablet Take 500 mg by mouth 2 (two) times daily.     methocarbamol  (ROBAXIN ) 500 MG tablet Take 1 tablet (500 mg total) by mouth every 8 (  eight) hours as needed for muscle spasms. 30 tablet 0   rOPINIRole  (REQUIP ) 1 MG tablet Take 1.5 tablets (1.5 mg total) by mouth 3 (three) times daily. 135 tablet 11   tamsulosin  (FLOMAX ) 0.4 MG CAPS capsule Take 0.4 mg by mouth at bedtime.      hydrOXYzine  (ATARAX ) 25 MG tablet Take 1 tablet (25 mg total) by mouth every 8 (eight) hours as needed for up to 20 doses for itching. (Patient not taking: Reported on 09/05/2024) 20 tablet 0   No facility-administered medications prior to visit.     Social History   Socioeconomic History   Marital  status: Married    Spouse name: Not on file   Number of children: 4   Years of education: College   Highest education level: Not on file  Occupational History   Occupation: N/A  Tobacco Use   Smoking status: Former    Current packs/day: 0.00    Average packs/day: 1.5 packs/day for 20.0 years (30.0 ttl pk-yrs)    Types: Cigarettes    Start date: 02/03/1975    Quit date: 02/03/1995    Years since quitting: 29.6   Smokeless tobacco: Never  Vaping Use   Vaping status: Never Used  Substance and Sexual Activity   Alcohol  use: No    Alcohol /week: 0.0 standard drinks of alcohol    Drug use: No    Comment: quit heroin and cocaine in 1994   Sexual activity: Yes  Other Topics Concern   Not on file  Social History Narrative   Drinks about 1 cup of coffee a day    Social Drivers of Health   Tobacco Use: Medium Risk (09/05/2024)   Patient History    Smoking Tobacco Use: Former    Smokeless Tobacco Use: Never    Passive Exposure: Not on Actuary Strain: Not on file  Food Insecurity: No Food Insecurity (08/12/2024)   Epic    Worried About Programme Researcher, Broadcasting/film/video in the Last Year: Never true    Ran Out of Food in the Last Year: Never true  Transportation Needs: No Transportation Needs (08/12/2024)   Epic    Lack of Transportation (Medical): No    Lack of Transportation (Non-Medical): No  Physical Activity: Not on file  Stress: Not on file  Social Connections: Socially Integrated (08/12/2024)   Social Connection and Isolation Panel    Frequency of Communication with Friends and Family: Three times a week    Frequency of Social Gatherings with Friends and Family: Once a week    Attends Religious Services: More than 4 times per year    Active Member of [employer] or Organizations: Yes    Attends Banker Meetings: More than 4 times per year    Marital Status: Married  Catering Manager Violence: Not At Risk (08/12/2024)   Epic    Fear of Current or Ex-Partner: No     Emotionally Abused: No    Physically Abused: No    Sexually Abused: No  Depression (PHQ2-9): Low Risk (10/25/2021)   Depression (PHQ2-9)    PHQ-2 Score: 0  Alcohol  Screen: Not on file  Housing: Low Risk (08/12/2024)   Epic    Unable to Pay for Housing in the Last Year: No    Number of Times Moved in the Last Year: 0    Homeless in the Last Year: No  Utilities: Not At Risk (08/12/2024)   Epic    Threatened with loss of utilities: No  Health Literacy: Not on file      Review of Systems    All other ros negative  Objective:    BP 132/77   Pulse 82   Temp (!) 97.5 F (36.4 C) (Temporal)   Ht 6' 3.5 (1.918 m)   Wt 262 lb (118.8 kg)   SpO2 97%   BMI 32.32 kg/m  Nursing note and vital signs reviewed.  Physical Exam     General/constitutional: no distress, pleasant HEENT: Normocephalic, PER, Conj Clear, EOMI, Oropharynx clear Neck supple CV: rrr no mrg Lungs: clear to auscultation, normal respiratory effort Abd: Soft, Nontender Ext: no edema Skin: No Rash Neuro: nonfocal MSK: left shoulder nontender; intact rom; no swelling/redness   Labs: Lab Results  Component Value Date   WBC 9.7 08/12/2024   HGB 12.3 (L) 08/12/2024   HCT 37.7 (L) 08/12/2024   MCV 91.3 08/12/2024   PLT 269 08/12/2024   Last metabolic panel Lab Results  Component Value Date   GLUCOSE 112 (H) 08/12/2024   NA 137 08/12/2024   K 4.0 08/12/2024   CL 104 08/12/2024   CO2 23 08/12/2024   BUN 13 08/12/2024   CREATININE 0.67 08/12/2024   GFRNONAA >60 08/12/2024   CALCIUM 9.3 08/12/2024   PROT 7.5 12/05/2022   ALBUMIN  4.0 12/05/2022   BILITOT 0.8 12/05/2022   ALKPHOS 76 12/05/2022   AST 16 12/05/2022   ALT 17 12/05/2022   ANIONGAP 10 08/12/2024   Lab Results  Component Value Date   CRP 0.9 09/11/2021    Micro:  Serology:  Imaging:  Assessment & Plan:   Problem List Items Addressed This Visit   None Visit Diagnoses       Infection of prosthetic joint, subsequent  encounter    -  Primary         No orders of the defined types were placed in this encounter.    Date of Admission:  08/12/2024                                        Abx: Doxy ceftriaxone    ASSESSMENT: Patient with dm2 and RA on methotrexate  and infliximab  bioequivalent, here for pji f/u   Prosthetic joint shoulder infection recurrent x1 07/13/24 At that time cx hasn't resulted and discharged on doxy/ceftriaxone    Our team have been trying to reach patient to change to just doxy as this grew p-acnes     Interval hx left humerous fracture s/p ORIF 12/29   Tolerating abx well  ----- 09/05/24 id clinic assessment  Nausea with doxycycline  Left shoulder sore 5/10 currently. Always like this  Good range of motion. No sign of active infection in terms of swelling/redness/tenderness on exam  Continue doxycycline  indefinitely. Refill rx  He is doing well today and just had chemistry 3 weeks ago so will defer for next visit   F/u 3 months from now   Follow-up: Return in about 3 months (around 12/04/2024).      Constance ONEIDA Passer, MD [facility] 09/05/2024, 9:52 AM     [1]  Allergies Allergen Reactions   Lisinopril Cough   Statins Other (See Comments)    Muscle pain   "

## 2024-09-05 NOTE — Patient Instructions (Signed)
 See us  in 3 months; will check blood test at that time   Continue doxycycline  Avoid dairy, calcium, iron within 2 hours of taking doxycycline  Stay upright and use a full glass of water  when taking doxycycline  (stay upright half an hour after) Wear sunscreen when exposed to sun

## 2024-09-08 ENCOUNTER — Telehealth: Payer: Self-pay

## 2024-09-08 NOTE — Telephone Encounter (Signed)
 It doesnt   However should he have profuse bleeding that he sees .... not in the toilet bowl as we can never tell... but actually blood coming out, clot, dizziness, chest pain, then needs to go to er  Again doxy doesnt cause red blood or lower gi bleed   Thanks

## 2024-09-08 NOTE — Telephone Encounter (Signed)
 Patient updated

## 2024-09-08 NOTE — Telephone Encounter (Signed)
 Patient called stating he noticed blood in stool this AM. Recently restarted taking doxy a couple days ago after being off for a couple of weeks.  Mentioned he had radiation treatment one month ago for Malignant neoplasm of prostate. Also has history of bleed ulcers from years ago.  Patient would like to ensure doxy is not causing rectal bleeding.  Lorenda CHRISTELLA Code, RMA

## 2024-11-02 ENCOUNTER — Ambulatory Visit: Admitting: Adult Health

## 2024-11-29 ENCOUNTER — Ambulatory Visit: Payer: Self-pay | Admitting: Infectious Disease
